# Patient Record
Sex: Male | Born: 1940
Health system: Southern US, Community
[De-identification: ages and names within clinical notes are randomized; demographics above are authoritative.]

## PROBLEM LIST (undated history)

## (undated) DIAGNOSIS — K219 Gastro-esophageal reflux disease without esophagitis: Secondary | ICD-10-CM

## (undated) DIAGNOSIS — I714 Abdominal aortic aneurysm, without rupture, unspecified: Secondary | ICD-10-CM

## (undated) DIAGNOSIS — I10 Essential (primary) hypertension: Secondary | ICD-10-CM

## (undated) DIAGNOSIS — I509 Heart failure, unspecified: Secondary | ICD-10-CM

## (undated) DIAGNOSIS — R519 Headache, unspecified: Secondary | ICD-10-CM

## (undated) DIAGNOSIS — I4891 Unspecified atrial fibrillation: Secondary | ICD-10-CM

## (undated) DIAGNOSIS — I219 Acute myocardial infarction, unspecified: Secondary | ICD-10-CM

## (undated) DIAGNOSIS — C801 Malignant (primary) neoplasm, unspecified: Secondary | ICD-10-CM

## (undated) DIAGNOSIS — I499 Cardiac arrhythmia, unspecified: Secondary | ICD-10-CM

## (undated) DIAGNOSIS — G610 Guillain-Barre syndrome: Secondary | ICD-10-CM

## (undated) DIAGNOSIS — I639 Cerebral infarction, unspecified: Secondary | ICD-10-CM

## (undated) DIAGNOSIS — D496 Neoplasm of unspecified behavior of brain: Secondary | ICD-10-CM

## (undated) DIAGNOSIS — N200 Calculus of kidney: Secondary | ICD-10-CM

## (undated) HISTORY — DX: Cardiac arrhythmia, unspecified: I49.9

## (undated) HISTORY — DX: Neoplasm of unspecified behavior of brain: D49.6

## (undated) HISTORY — PX: LITHOTRIPSY: SUR834

## (undated) HISTORY — DX: Guillain-Barre syndrome: G61.0

## (undated) HISTORY — DX: Calculus of kidney: N20.0

## (undated) HISTORY — DX: Heart failure, unspecified: I50.9

---

## 1986-02-13 DIAGNOSIS — G610 Guillain-Barre syndrome: Secondary | ICD-10-CM

## 1986-02-13 HISTORY — DX: Guillain-Barre syndrome: G61.0

## 1995-04-03 DIAGNOSIS — I219 Acute myocardial infarction, unspecified: Secondary | ICD-10-CM

## 1995-04-03 HISTORY — PX: CORONARY ANGIOPLASTY: SHX604

## 1995-04-03 HISTORY — DX: Acute myocardial infarction, unspecified: I21.9

## 2004-05-02 ENCOUNTER — Ambulatory Visit: Payer: Self-pay | Admitting: Family Medicine

## 2004-06-06 ENCOUNTER — Ambulatory Visit: Payer: Self-pay | Admitting: Family Medicine

## 2004-09-04 ENCOUNTER — Ambulatory Visit: Payer: Self-pay | Admitting: Family Medicine

## 2005-01-11 ENCOUNTER — Ambulatory Visit: Payer: Self-pay | Admitting: Family Medicine

## 2005-04-19 ENCOUNTER — Ambulatory Visit: Payer: Self-pay | Admitting: Family Medicine

## 2005-04-26 ENCOUNTER — Ambulatory Visit: Payer: Self-pay | Admitting: Family Medicine

## 2005-08-20 ENCOUNTER — Ambulatory Visit: Payer: Self-pay | Admitting: Family Medicine

## 2005-12-25 ENCOUNTER — Ambulatory Visit: Payer: Self-pay | Admitting: Family Medicine

## 2006-02-25 ENCOUNTER — Ambulatory Visit: Payer: Self-pay | Admitting: Family Medicine

## 2006-05-30 ENCOUNTER — Ambulatory Visit: Payer: Self-pay | Admitting: Family Medicine

## 2006-07-01 ENCOUNTER — Ambulatory Visit: Payer: Self-pay | Admitting: Family Medicine

## 2010-06-22 ENCOUNTER — Other Ambulatory Visit: Payer: Self-pay | Admitting: Neurology

## 2010-06-22 DIAGNOSIS — G610 Guillain-Barre syndrome: Secondary | ICD-10-CM

## 2010-06-22 DIAGNOSIS — M531 Cervicobrachial syndrome: Secondary | ICD-10-CM

## 2010-06-22 DIAGNOSIS — H8110 Benign paroxysmal vertigo, unspecified ear: Secondary | ICD-10-CM

## 2010-06-22 DIAGNOSIS — I2510 Atherosclerotic heart disease of native coronary artery without angina pectoris: Secondary | ICD-10-CM

## 2010-06-22 DIAGNOSIS — E785 Hyperlipidemia, unspecified: Secondary | ICD-10-CM

## 2010-06-22 DIAGNOSIS — H11069 Recurrent pterygium of unspecified eye: Secondary | ICD-10-CM

## 2010-06-22 DIAGNOSIS — N35919 Unspecified urethral stricture, male, unspecified site: Secondary | ICD-10-CM

## 2010-06-26 ENCOUNTER — Ambulatory Visit
Admission: RE | Admit: 2010-06-26 | Discharge: 2010-06-26 | Disposition: A | Discharge status: Home / Self Care | Payer: Medicare Other | Source: Ambulatory Visit | Attending: Neurology | Admitting: Neurology

## 2010-06-26 DIAGNOSIS — H8110 Benign paroxysmal vertigo, unspecified ear: Secondary | ICD-10-CM

## 2010-06-26 DIAGNOSIS — H11069 Recurrent pterygium of unspecified eye: Secondary | ICD-10-CM

## 2010-06-26 DIAGNOSIS — I2510 Atherosclerotic heart disease of native coronary artery without angina pectoris: Secondary | ICD-10-CM

## 2010-06-26 DIAGNOSIS — G610 Guillain-Barre syndrome: Secondary | ICD-10-CM

## 2010-06-26 DIAGNOSIS — E785 Hyperlipidemia, unspecified: Secondary | ICD-10-CM

## 2010-06-26 DIAGNOSIS — N35919 Unspecified urethral stricture, male, unspecified site: Secondary | ICD-10-CM

## 2010-06-26 DIAGNOSIS — M531 Cervicobrachial syndrome: Secondary | ICD-10-CM

## 2010-06-26 MED ORDER — GADOBENATE DIMEGLUMINE 529 MG/ML IV SOLN
17.0000 mL | Freq: Once | INTRAVENOUS | Status: AC | PRN
  Administered 2010-06-26: 17 mL via INTRAVENOUS

## 2010-06-27 ENCOUNTER — Ambulatory Visit (HOSPITAL_BASED_OUTPATIENT_CLINIC_OR_DEPARTMENT_OTHER)
Admission: RE | Admit: 2010-06-27 | Discharge: 2010-06-27 | Disposition: A | Discharge status: Home / Self Care | Payer: Medicare Other | Source: Ambulatory Visit | Attending: Urology | Admitting: Urology

## 2010-06-27 ENCOUNTER — Ambulatory Visit (HOSPITAL_COMMUNITY)
Admission: RE | Admit: 2010-06-27 | Discharge: 2010-06-27 | Disposition: A | Discharge status: Home / Self Care | Payer: Medicare Other | Source: Ambulatory Visit | Attending: Urology | Admitting: Urology

## 2010-06-27 DIAGNOSIS — I771 Stricture of artery: Secondary | ICD-10-CM | POA: Insufficient documentation

## 2010-06-27 DIAGNOSIS — Z01812 Encounter for preprocedural laboratory examination: Secondary | ICD-10-CM | POA: Insufficient documentation

## 2010-06-27 DIAGNOSIS — Z01811 Encounter for preprocedural respiratory examination: Secondary | ICD-10-CM | POA: Insufficient documentation

## 2010-06-27 DIAGNOSIS — N478 Other disorders of prepuce: Secondary | ICD-10-CM | POA: Insufficient documentation

## 2010-06-27 DIAGNOSIS — N471 Phimosis: Secondary | ICD-10-CM | POA: Insufficient documentation

## 2010-06-27 DIAGNOSIS — I10 Essential (primary) hypertension: Secondary | ICD-10-CM | POA: Insufficient documentation

## 2010-06-27 DIAGNOSIS — M47814 Spondylosis without myelopathy or radiculopathy, thoracic region: Secondary | ICD-10-CM | POA: Insufficient documentation

## 2010-06-27 DIAGNOSIS — N35919 Unspecified urethral stricture, male, unspecified site: Secondary | ICD-10-CM | POA: Insufficient documentation

## 2010-06-27 DIAGNOSIS — N480 Leukoplakia of penis: Secondary | ICD-10-CM | POA: Insufficient documentation

## 2010-06-27 LAB — POCT I-STAT 4, (NA,K, GLUC, HGB,HCT)
Hemoglobin: 16.3 g/dL (ref 13.0–17.0)
Sodium: 144 mEq/L (ref 135–145)

## 2010-06-27 NOTE — Op Note (Signed)
NAME:  Theodore Mendoza, Theodore Mendoza NO.:  [id number]  MEDICAL RECORD NO.:  [id number]           PATIENT TYPE:  O  LOCATION:  XRAY                         FACILITY:  [facility]  PHYSICIAN:  Martina Sinner, MD DATE OF BIRTH:  Apr 22, 1940  DATE OF PROCEDURE:  06/27/2010 DATE OF DISCHARGE:                              OPERATIVE REPORT   SURGEON:  Martina Sinner, MD.  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  OPERATIONS: 1. Cystoscopy. 2. Retrograde urethrogram. 3. Dilation of meatal stenosis and repair of phimosis.  INDICATIONS:  Theodore Mendoza has the above diagnoses.  Extra care was taken with leg positioning.  He was given preoperative cephalosporin IV.  DESCRIPTION OF PROCEDURE:  Under anesthesia I could see approximately 0.5 cm of his glans penis and part of what appeared to be a fairly healthy urethral meatus.  It looked like the distal obstruction was probably due to the overlying concealment from the foreskin adhered to the glans penis from BXO and probably not true meatal stenosis.  In any event, I easily dilated him to 18-French with a male sound well lubricated and easily inserted a 14-French catheter into his bladder.  For approximately the next 45 to 60 minutes, I used blunt dissection with retraction maneuvers that were well controlled and some sharp dissection to take back his penile skin off the glans penis and beyond the corona circumferentially.  More work was done at 7 o'clock just to the right side of the frenulum.  The dissection actually went very well and I never entered the glans penis or tunica albuginea.  Cautery was used for minimal bleeding.  The amount of adherence was quite remarkable but with careful dissection, I was able to  free up the glans as well as urethral meatus.  Urethral meatus at the end of the case was more than 1 cm from the proximal skin.  I cystoscoped the patient and he had a normal penile bulbar membranous urethra.  He had bilobar enlargement of the prostate and high-riding bladder neck.  His bladder was minimally trabeculated.  His prostatic urethra was little bit rigid.  Bladder mucosa and trigone were normal. There was no stitch, foreign body, or carcinoma.  A 14-French catheter was reinserted.  After taking down the adhered skin, I reapproximated the penile skin to shiny subcoronal tissue with approximately 20 interrupted 4-0 chromic almost as if I was doing a closure after circumcision.  This buried raw subcutaneous tissue from my dissection and really made the repair look good and not under tension. After cleaning the penis, I did a dorsal block with 7 cc of 0.25% Marcaine without epinephrine.  I then applied a light Coban dressing with Telfa and Polysporin ointment. The glans looked healthy.  Never during the dissection did I interfere with blood supply to the glans penis or distal corporal body.  I was very pleased with Theodore Mendoza procedure and hopefully this will reach his treatment goal.  ______________________________ Martina Sinner, MD     SAM/MEDQ  D:  06/27/2010  T:  06/27/2010  Job:  [id number]  Electronically Signed by Alfredo Martinez MD on 06/27/2010 12:56:16 PM

## 2010-06-27 NOTE — Op Note (Signed)
  NAME:  Theodore Mendoza, Theodore Mendoza NO.:  [id number]  MEDICAL RECORD NO.:  [id number]           PATIENT TYPE:  O  LOCATION:  XRAY                         [facility]ACILITY:  [facility]  PHYSICIAN:  Martina Sinner, MD DATE OF BIRTH:  [birth year]  DATE OF PROCEDURE: DATE OF DISCHARGE:                              OPERATIVE REPORT   ADDENDUM  PREOPERATIVE DIAGNOSIS:  Meatal stenosis, phimosis secondary to balanitis xerotica obliterans.  POSTOPERATIVE DIAGNOSIS:  Meatal stenosis, phimosis secondary to balanitis xerotica obliterans.  PROCEDURE:  Cystoscopy, retrograde urethrogram, urethral dilation plus repair of phimosis.  SURGEON:  Martina Sinner, MD, October 27.  I did a retrograde urethrogram with a 6-French open-ended ureteral catheter as part of the operation previously dictated.  The patient's retrograde urethrogram; with the patient in AP and anterior-posterior position in lithotomy, I placed the penis on stretch to the patient's left side.  Fluoroscopy was utilized.  Well-lubricated 6-French open-end ureteral catheter was inserted just beyond the meatus and I injected 7 mL of contrast and saw a normal looking urethra with contrast entering the bladder.          ______________________________ Martina Sinner, MD     SAM/MEDQ  D:  06/27/2010  T:  06/27/2010  Job:  [id number]  Electronically Signed by Alfredo Martinez MD on 06/27/2010 12:56:18 PM

## 2011-01-17 DIAGNOSIS — Z8669 Personal history of other diseases of the nervous system and sense organs: Secondary | ICD-10-CM | POA: Insufficient documentation

## 2011-01-17 DIAGNOSIS — I252 Old myocardial infarction: Secondary | ICD-10-CM | POA: Insufficient documentation

## 2011-12-13 DIAGNOSIS — D329 Benign neoplasm of meninges, unspecified: Secondary | ICD-10-CM | POA: Insufficient documentation

## 2013-07-31 HISTORY — PX: EYE SURGERY: SHX253

## 2014-02-09 HISTORY — PX: OTHER SURGICAL HISTORY: SHX169

## 2014-05-07 ENCOUNTER — Emergency Department (HOSPITAL_COMMUNITY): Payer: Medicare Other

## 2014-05-07 ENCOUNTER — Inpatient Hospital Stay (HOSPITAL_COMMUNITY)
Admission: EM | Admit: 2014-05-07 | Discharge: 2014-05-09 | DRG: 871 | Disposition: A | Discharge status: Home / Self Care | Payer: Medicare Other | Attending: Family Medicine | Admitting: Family Medicine

## 2014-05-07 ENCOUNTER — Encounter (HOSPITAL_COMMUNITY): Payer: Self-pay | Admitting: Family Medicine

## 2014-05-07 DIAGNOSIS — G650 Sequelae of Guillain-Barre syndrome: Secondary | ICD-10-CM | POA: Diagnosis present

## 2014-05-07 DIAGNOSIS — Z7982 Long term (current) use of aspirin: Secondary | ICD-10-CM

## 2014-05-07 DIAGNOSIS — I10 Essential (primary) hypertension: Secondary | ICD-10-CM | POA: Diagnosis present

## 2014-05-07 DIAGNOSIS — M24542 Contracture, left hand: Secondary | ICD-10-CM | POA: Diagnosis present

## 2014-05-07 DIAGNOSIS — N136 Pyonephrosis: Secondary | ICD-10-CM | POA: Diagnosis present

## 2014-05-07 DIAGNOSIS — I2510 Atherosclerotic heart disease of native coronary artery without angina pectoris: Secondary | ICD-10-CM | POA: Diagnosis present

## 2014-05-07 DIAGNOSIS — N179 Acute kidney failure, unspecified: Secondary | ICD-10-CM | POA: Diagnosis present

## 2014-05-07 DIAGNOSIS — N12 Tubulo-interstitial nephritis, not specified as acute or chronic: Secondary | ICD-10-CM | POA: Diagnosis present

## 2014-05-07 DIAGNOSIS — A419 Sepsis, unspecified organism: Secondary | ICD-10-CM | POA: Diagnosis present

## 2014-05-07 DIAGNOSIS — N202 Calculus of kidney with calculus of ureter: Secondary | ICD-10-CM | POA: Diagnosis present

## 2014-05-07 DIAGNOSIS — R531 Weakness: Secondary | ICD-10-CM | POA: Diagnosis present

## 2014-05-07 DIAGNOSIS — IMO0001 Reserved for inherently not codable concepts without codable children: Secondary | ICD-10-CM | POA: Insufficient documentation

## 2014-05-07 DIAGNOSIS — Z86011 Personal history of benign neoplasm of the brain: Secondary | ICD-10-CM

## 2014-05-07 DIAGNOSIS — T464X5A Adverse effect of angiotensin-converting-enzyme inhibitors, initial encounter: Secondary | ICD-10-CM | POA: Diagnosis present

## 2014-05-07 DIAGNOSIS — M24541 Contracture, right hand: Secondary | ICD-10-CM | POA: Diagnosis present

## 2014-05-07 DIAGNOSIS — G92 Toxic encephalopathy: Secondary | ICD-10-CM | POA: Diagnosis present

## 2014-05-07 DIAGNOSIS — Z951 Presence of aortocoronary bypass graft: Secondary | ICD-10-CM | POA: Diagnosis not present

## 2014-05-07 DIAGNOSIS — E785 Hyperlipidemia, unspecified: Secondary | ICD-10-CM | POA: Diagnosis present

## 2014-05-07 DIAGNOSIS — R4182 Altered mental status, unspecified: Secondary | ICD-10-CM

## 2014-05-07 DIAGNOSIS — I252 Old myocardial infarction: Secondary | ICD-10-CM

## 2014-05-07 DIAGNOSIS — N390 Urinary tract infection, site not specified: Secondary | ICD-10-CM

## 2014-05-07 DIAGNOSIS — R109 Unspecified abdominal pain: Secondary | ICD-10-CM

## 2014-05-07 DIAGNOSIS — N132 Hydronephrosis with renal and ureteral calculous obstruction: Secondary | ICD-10-CM | POA: Insufficient documentation

## 2014-05-07 DIAGNOSIS — F329 Major depressive disorder, single episode, unspecified: Secondary | ICD-10-CM | POA: Diagnosis present

## 2014-05-07 HISTORY — DX: Essential (primary) hypertension: I10

## 2014-05-07 LAB — URINALYSIS, ROUTINE W REFLEX MICROSCOPIC
GLUCOSE, UA: NEGATIVE mg/dL
Ketones, ur: 15 mg/dL — AB
Nitrite: POSITIVE — AB
PH: 5 (ref 5.0–8.0)
Protein, ur: >300 mg/dL — AB
Specific Gravity, Urine: 1.02 (ref 1.005–1.030)
Urobilinogen, UA: 1 mg/dL (ref 0.0–1.0)

## 2014-05-07 LAB — COMPREHENSIVE METABOLIC PANEL
ALK PHOS: 75 U/L (ref 39–117)
ALT: 12 U/L (ref 0–53)
AST: 20 U/L (ref 0–37)
Albumin: 3.3 g/dL — ABNORMAL LOW (ref 3.5–5.2)
Anion gap: 5 (ref 5–15)
BUN: 41 mg/dL — ABNORMAL HIGH (ref 6–23)
CHLORIDE: 100 mmol/L (ref 96–112)
CO2: 30 mmol/L (ref 19–32)
Calcium: 9 mg/dL (ref 8.4–10.5)
Creatinine, Ser: 2.52 mg/dL — ABNORMAL HIGH (ref 0.50–1.35)
GFR, EST AFRICAN AMERICAN: 27 mL/min — AB (ref >90)
GFR, EST NON AFRICAN AMERICAN: 23 mL/min — AB (ref >90)
Glucose, Bld: 114 mg/dL — ABNORMAL HIGH (ref 70–99)
POTASSIUM: 3.9 mmol/L (ref 3.5–5.1)
Sodium: 135 mmol/L (ref 135–145)
Total Bilirubin: 0.9 mg/dL (ref 0.3–1.2)
Total Protein: 6.4 g/dL (ref 6.0–8.3)

## 2014-05-07 LAB — CBC WITH DIFFERENTIAL/PLATELET
BASOS ABS: 0 10*3/uL (ref 0.0–0.1)
Basophils Relative: 0 % (ref 0–1)
EOS PCT: 1 % (ref 0–5)
Eosinophils Absolute: 0.1 10*3/uL (ref 0.0–0.7)
HCT: 39.5 % (ref 39.0–52.0)
Hemoglobin: 13.2 g/dL (ref 13.0–17.0)
LYMPHS ABS: 1.4 10*3/uL (ref 0.7–4.0)
LYMPHS PCT: 8 % — AB (ref 12–46)
MCH: 29.2 pg (ref 26.0–34.0)
MCHC: 33.4 g/dL (ref 30.0–36.0)
MCV: 87.4 fL (ref 78.0–100.0)
Monocytes Absolute: 1.4 10*3/uL — ABNORMAL HIGH (ref 0.1–1.0)
Monocytes Relative: 8 % (ref 3–12)
NEUTROS ABS: 14.4 10*3/uL — AB (ref 1.7–7.7)
Neutrophils Relative %: 83 % — ABNORMAL HIGH (ref 43–77)
PLATELETS: 139 10*3/uL — AB (ref 150–400)
RBC: 4.52 MIL/uL (ref 4.22–5.81)
RDW: 13.8 % (ref 11.5–15.5)
WBC: 17.3 10*3/uL — ABNORMAL HIGH (ref 4.0–10.5)

## 2014-05-07 LAB — URINE MICROSCOPIC-ADD ON

## 2014-05-07 LAB — I-STAT CG4 LACTIC ACID, ED
LACTIC ACID, VENOUS: 1.04 mmol/L (ref 0.5–2.0)
Lactic Acid, Venous: 0.53 mmol/L (ref 0.5–2.0)

## 2014-05-07 MED ORDER — SODIUM CHLORIDE 0.9 % IV BOLUS (SEPSIS)
1000.0000 mL | INTRAVENOUS | Status: AC
  Administered 2014-05-07 (×2): 1000 mL via INTRAVENOUS

## 2014-05-07 MED ORDER — DEXTROSE 5 % IV SOLN: 1.0000 g | INTRAVENOUS | Status: DC

## 2014-05-07 MED ORDER — SODIUM CHLORIDE 0.9 % IV BOLUS (SEPSIS)
500.0000 mL | INTRAVENOUS | Status: AC
  Administered 2014-05-07: 500 mL via INTRAVENOUS

## 2014-05-07 MED ORDER — SODIUM CHLORIDE 0.9 % IV SOLN: Freq: Once | INTRAVENOUS | Status: DC

## 2014-05-07 MED ORDER — DEXTROSE 5 % IV SOLN
2.0000 g | Freq: Once | INTRAVENOUS | Status: AC
  Administered 2014-05-07: 2 g via INTRAVENOUS
  Filled 2014-05-07: qty 2

## 2014-05-07 MED ORDER — NALOXONE HCL 0.4 MG/ML IJ SOLN
INTRAMUSCULAR | Status: AC
  Filled 2014-05-07: qty 1

## 2014-05-07 NOTE — ED Notes (Signed)
Attempted to call report

## 2014-05-07 NOTE — H&P (Signed)
[facility] History and Physical  Theodore Mendoza ZGY:[id number] DOB: December 10, 1940 DOA: 05/07/2014  Referring physician: ED physician PCP: Sherrie Mustache, MD  Specialists:   Chief Complaint: Generalized weakness and hallucination  HPI: Theodore Mendoza is a 74 y.o. 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  Patient was was treated in the [facility] on 04/10/14 for UTI. He completed the Keflex treatment. Symptoms improved. At 05/05/14, patient developed left flank pain. He was evaluated in [facility]. He was diagnosed with kidney stone. He was discharged home on tamsulosin, Zofran, ketorolac, percocet and ciprofloxacin.  After 3 days of taking those medications, he developed hallucination, and he stopped taking his medications. Per his wife, patient saw candy flying in the room and strange people on the wall. Patient denies fever or chills. He denies symptoms of UTI. Patient denies fever, chills, cough, chest pain, SOB, abdominal pain, diarrhea, dysuria, urgency, frequency, hematuria, skin rashes.   Work up in the ED demonstrates positive urinalysis for UTI. Lactate 1.04. CT head is negative for acute abnormalities. Leukocytosis with WBC 17.3. CT-renal stone protocol showed moderate left-sided hydroureteronephrosis secondary to a proximal ureteric stone and left nephrolithiasis. Patient is admitted to inpatient for further evaluation and treatment. Urology was consulted by ED.  Review of Systems: As presented in the history of presenting illness, rest negative.  Where does patient live?  At home Can patient participate in ADLs? none  Allergy:  Allergies  Allergen Reactions  . Hydrocodone-Acetaminophen Other (See Comments)    Confusion,  weakness, hallucinations, mental status changes  . Morphine And Related Other (See Comments)    Confusion, weakness, hallucinations, mental status changes  . Valsartan Rash    Past Medical History  Diagnosis Date  . Hypertension     History reviewed. No pertinent past surgical history.  Social History:  reports that he has never smoked. He does not have any smokeless tobacco history on file. He reports that he does not drink alcohol or use illicit drugs.  Family History: History reviewed. No pertinent family history.   Prior to Admission medications   Not on File    Physical Exam: Filed Vitals:   05/08/14 0815 05/08/14 0820 05/08/14 0825 05/08/14 0840  BP: 126/64   123/50  Pulse: 73 75 77 71  Temp: 98.5 F (36.9 C)  98.5 F (36.9 C) 98.1 F (36.7 C)  TempSrc:    Axillary  Resp: 16 17 13 18   Height:      Weight:      SpO2: 97% 99% 100% 100%   General: Not in acute distress HEENT:       Eyes: PERRL, EOMI, no scleral icterus       ENT: No discharge from the ears and nose, no pharynx injection, no tonsillar enlargement.        Neck: No JVD, no bruit, no mass felt. Cardiac: S1/S2, RRR, No murmurs, No gallops or rubs Pulm: Good air movement bilaterally. Clear to auscultation bilaterally. No rales, wheezing, rhonchi or rubs. Abd: Soft, nondistended, nontender, no rebound pain, no organomegaly, BS present. Positive left CVA tenderness Ext: No edema bilaterally. 2+DP/PT pulse bilaterally Musculoskeletal: hand contracture and weakness in both legs, on leg braces. Skin: No rashes.  Neuro: Alert and oriented X3, cranial nerves II-XII grossly intact, moves all extremities.  Psych: Patient is not psychotic, no suicidal or hemocidal ideation.  Labs on Admission:  Basic Metabolic Panel:  Recent Labs Lab 05/07/14 1403 05/08/14 0300  NA 135 138  K 3.9 4.2  CL 100 107  CO2 30 25  GLUCOSE 114* 100*  BUN 41* 44*  CREATININE 2.52* 1.84*  CALCIUM 9.0 8.1*   Liver Function  Tests:  Recent Labs Lab 05/07/14 1403 05/08/14 0300  AST 20 13  ALT 12 10  ALKPHOS 75 63  BILITOT 0.9 0.7  PROT 6.4 5.8*  ALBUMIN 3.3* 3.0*   No results for input(s): LIPASE, AMYLASE in the last 168 hours. No results for input(s): AMMONIA in the last 168 hours. CBC:  Recent Labs Lab 05/07/14 1403 05/08/14 0300  WBC 17.3* 12.6*  NEUTROABS 14.4*  --   HGB 13.2 11.3*  HCT 39.5 34.8*  MCV 87.4 89.5  PLT 139* 121*   Cardiac Enzymes: No results for input(s): CKTOTAL, CKMB, CKMBINDEX, TROPONINI in the last 168 hours.  BNP (last 3 results) No results for input(s): BNP in the last 8760 hours.  ProBNP (last 3 results) No results for input(s): PROBNP in the last 8760 hours.  CBG:  Recent Labs Lab 05/08/14 0635  GLUCAP 88    Radiological Exams on Admission: Ct Head Wo Contrast  05/07/2014   CLINICAL DATA:  Mental status change  EXAM: CT HEAD WITHOUT CONTRAST  TECHNIQUE: Contiguous axial images were obtained from the base of the skull through the vertex without intravenous contrast.  COMPARISON:  06/26/2010 brain MRI interpreted by [facility], report not available. No prior head CT for comparison  FINDINGS: Stable left cerebellopontine angle mass with peripheral rim calcification as previously evaluated. Mild cortical volume loss noted with proportional ventricular prominence. Areas of periventricular white matter hypodensity are most compatible with small vessel ischemic change. No acute hemorrhage, infarct, or mass lesion is identified. No midline shift. Stable bilateral tiny basal ganglial lacunar infarcts. Mild pansinusitis with mucoperiosteal thickening but no osseous destruction. No skull fracture.  IMPRESSION: No new acute intracranial finding.  Stable findings as above.   Electronically Signed   By: Conchita Paris M.D.   On: 05/07/2014 19:58   Dg Chest Port 1 View  05/07/2014   CLINICAL DATA:  Weakness  EXAM: PORTABLE CHEST - 1 VIEW  COMPARISON:   06/27/2010  FINDINGS: Heart size at upper limits of normal. Central vascular congestion noted without overt edema. No new pulmonary opacity. Linear left lower lobe atelectasis or scarring is noted. No pleural effusion.  IMPRESSION: Low volumes with curvilinear left lower lobe atelectasis or scarring.   Electronically Signed   By: Conchita Paris M.D.   On: 05/07/2014 19:52   Ct Renal Stone Study  05/07/2014   CLINICAL DATA:  Initial encounter for kidney stones. Urinary retention. Left flank pain and dark urine.  EXAM: CT ABDOMEN AND PELVIS WITHOUT CONTRAST  TECHNIQUE: Multidetector CT imaging of the abdomen and pelvis was performed following the standard protocol without IV contrast.  COMPARISON:  05/05/2014  FINDINGS: Lower chest: Mild left base atelectasis. Mild cardiomegaly with coronary artery atherosclerosis, including within the right coronary artery and LAD. No pericardial or pleural effusion.  Hepatobiliary: Mild hepatic steatosis. Normal gallbladder, without biliary ductal dilatation.  Pancreas: Moderate to marked pancreatic atrophy. No acute inflammation identified.  Spleen: Normal  Adrenals/Urinary Tract: Normal adrenal glands. Upper pole right renal low-density lesion which is likely a cyst. There is also a lower pole right renal cyst of 2.3 cm.  Multiple left renal collecting system calculi, including a lower pole 11 mm stone. Moderate left-sided hydroureteronephrosis is similar. This continues to the level of a proximal left ureteric 4 mm stone which may have progressed minimally. Example image 55 today. No distal ureteric or bladder stones.  Stomach/Bowel: Normal stomach, without wall thickening. Scattered colonic diverticula. Normal terminal ileum and appendix. Normal small bowel.  Vascular/Lymphatic: Aortic and branch vessel atherosclerosis. Non aneurysmal dilatation of the infrarenal aorta at 2.5 cm unchanged. Probable calcified wall thrombus. Mild celiac origin ectasia could relate to  poststenotic dilatation. No abdominopelvic adenopathy.  Reproductive: Mild prostatomegaly.  Other: Trace cul-de-sac fluid is new on image 83.  Musculoskeletal: No acute osseous abnormality.  IMPRESSION: 1. Moderate left-sided hydroureteronephrosis secondary to a proximal ureteric stone. This may have passed minimally distally since the CT of 05/05/2014 (from [facility]). 2. Left nephrolithiasis. 3.  Atherosclerosis, including within the coronary arteries. 4. Trace cul-de-sac fluid, new since the prior. 5. Prostatomegaly.   Electronically Signed   By: Abigail Miyamoto M.D.   On: 05/07/2014 21:39    Assessment/Plan Principal Problem:   Pyelonephritis Active Problems:   Sepsis   Essential (primary) hypertension   AKI (acute kidney injury)   Hyperlipidemia  Pyelonephritis and sepsis: Secondary to left Nephrolithiasis. He has Hydronephrosis as evidenced by CT scan. Urology was consulted. Dr. Tresa Moore saw the patient. Per dr. Tresa Moore, patient will need cysto / left ureteral stent in semi-urgent setting today for renal decompression,  followed by likely two stage ureteroscopic stone manipulation in outpatient setting in few weeks, after clears infectious parameters and GFR improved.   -will transfer pt to [facility] and put on tele bed - appreciate Urology's consult -treat infection with IV Rocephin -Follow-up blood culture and a urine culture -hold amlodipine, lisinopril and tamsulosin because of the sepsis  -IVF: NS 125cc/h -INR/PTTR -trend lactic acid level  HTN:  -continue metoprolol for blood pressure, hold other bp medications.  Acute Renal Failure: Cr 2.52 by ER labs 05/08/14, baseline Cr <1 in 2012. Most likely due to left hydro and infection -treat infection as above -follow up procedure per Urology  Hyperlipidemia:  -continue pravastatin  Hallucination: Most likely due to urinary tract infection and sepsis. His hallucination has resolved. CT head is negative for acute abnormalities. -Watch  closely.   DVT ppx: SQ Heparin        Code Status: Full code Family Communication:   Yes, patient's  wife     at bed side Disposition Plan: Admit to inpatient   Date of Service 05/08/2014    Ivor Costa [facility] Pager [phone]  If 7PM-7AM, please contact night-coverage [email or web address] Password [email or web address] 05/08/2014, 9:26 AM

## 2014-05-07 NOTE — ED Notes (Signed)
Attempted to call report at [phone].

## 2014-05-07 NOTE — ED Notes (Signed)
[facility] contacted to transport patient to [facility]

## 2014-05-07 NOTE — ED Notes (Signed)
Secretary Pharmacist, hospital for transport.

## 2014-05-07 NOTE — Consult Note (Signed)
Reason for Consult: Left Ureteral Joaquim Lai, Urosepsis  Referring Physician: Frederich Cha MD  Theodore Mendoza is an 74 y.o. male.   HPI:  1 - Left Nephrolithiasis with Hydronephrosis - left 60m ureteral stones with moderate hydro and approx 145mconglomerate left lower pole renal stones by ER CT 05/08/2014 on eval persistent left flank pain.   Pre 2016 - medical passage x several. SWL x1, URS x1.   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 NO baseline obsructive voiding symptoms.   3 - Acute Renal Failure - Cr 2.52 by ER labs 05/08/14, baselie Cr <1 in 2012. Left hydro as per above and very poor PO intake past few days.  4 Bacteruria - bacteruria and leukocytosis (17k) by ER labs 05/08/2014. No hypotension / tachycardia. BCX/UCX 05/08/14 pending.  PMH sig for GuLance Coonuses wheelchiar, but otherwise very well adapted), brain tumor [facility] Theodore Mendoza.  Today "Theodore Mendoza seen in consultation for above. He has been seen at 3 ER's in past week for flank pain and malaise and now with DX left ureteral stone and bacteruria.   Past Medical History  Diagnosis Date  . Hypertension     History reviewed. No pertinent past surgical history.  History reviewed. No pertinent family history.  Social History:  reports that he has never smoked. He does not have any smokeless tobacco history on file. He reports that he does not drink alcohol or use illicit drugs.  Allergies:  Allergies  Allergen Reactions  . Valsartan Rash    Medications: I have reviewed the patient's current medications.  Results for orders placed or performed during the hospital encounter of 05/07/14 (from the past 48 hour(s))  CBC with Differential     Status: Abnormal   Collection Time: 05/07/14  2:03 PM  Result Value Ref Range   WBC 17.3 (H) 4.0 - 10.5 K/uL   RBC 4.52 4.22 - 5.81 MIL/uL   Hemoglobin 13.2 13.0 - 17.0 g/dL   HCT  39.5 39.0 - 52.0 %   MCV 87.4 78.0 - 100.0 fL   MCH 29.2 26.0 - 34.0 pg   MCHC 33.4 30.0 - 36.0 g/dL   RDW 13.8 11.5 - 15.5 %   Platelets 139 (L) 150 - 400 K/uL   Neutrophils Relative % 83 (H) 43 - 77 %   Neutro Abs 14.4 (H) 1.7 - 7.7 K/uL   Lymphocytes Relative 8 (L) 12 - 46 %   Lymphs Abs 1.4 0.7 - 4.0 K/uL   Monocytes Relative 8 3 - 12 %   Monocytes Absolute 1.4 (H) 0.1 - 1.0 K/uL   Eosinophils Relative 1 0 - 5 %   Eosinophils Absolute 0.1 0.0 - 0.7 K/uL   Basophils Relative 0 0 - 1 %   Basophils Absolute 0.0 0.0 - 0.1 K/uL  Comprehensive metabolic panel     Status: Abnormal   Collection Time: 05/07/14  2:03 PM  Result Value Ref Range   Sodium 135 135 - 145 mmol/L   Potassium 3.9 3.5 - 5.1 mmol/L   Chloride 100 96 - 112 mmol/L   CO2 30 19 - 32 mmol/L   Glucose, Bld 114 (H) 70 - 99 mg/dL   BUN 41 (H) 6 - 23 mg/dL   Creatinine, Ser 2.52 (H) 0.50 - 1.35 mg/dL   Calcium 9.0 8.4 - 10.5 mg/dL   Total Protein 6.4 6.0 - 8.3  g/dL   Albumin 3.3 (L) 3.5 - 5.2 g/dL   AST 20 0 - 37 U/L   ALT 12 0 - 53 U/L   Alkaline Phosphatase 75 39 - 117 U/L   Total Bilirubin 0.9 0.3 - 1.2 mg/dL   GFR calc non Af Amer 23 (L) >90 mL/min   GFR calc Af Amer 27 (L) >90 mL/min    Comment: (NOTE) The eGFR has been calculated using the CKD EPI equation. This calculation has not been validated in all clinical situations. eGFR's persistently <90 mL/min signify possible Chronic Kidney Disease.    Anion gap 5 5 - 15  Urinalysis, Routine w reflex microscopic     Status: Abnormal   Collection Time: 05/07/14  6:38 PM  Result Value Ref Range   Color, Urine RED (A) YELLOW    Comment: BIOCHEMICALS MAY BE AFFECTED BY COLOR   APPearance TURBID (A) CLEAR   Specific Gravity, Urine 1.020 1.005 - 1.030   pH 5.0 5.0 - 8.0   Glucose, UA NEGATIVE NEGATIVE mg/dL   Hgb urine dipstick LARGE (A) NEGATIVE   Bilirubin Urine MODERATE (A) NEGATIVE   Ketones, ur 15 (A) NEGATIVE mg/dL   Protein, ur >300 (A) NEGATIVE mg/dL    Urobilinogen, UA 1.0 0.0 - 1.0 mg/dL   Nitrite POSITIVE (A) NEGATIVE   Leukocytes, UA LARGE (A) NEGATIVE  Urine microscopic-add on     Status: Abnormal   Collection Time: 05/07/14  6:38 PM  Result Value Ref Range   Squamous Epithelial / LPF RARE RARE   WBC, UA TOO NUMEROUS TO COUNT <3 WBC/hpf   RBC / HPF TOO NUMEROUS TO COUNT <3 RBC/hpf   Bacteria, UA MANY (A) RARE   Urine-Other AMORPHOUS URATES/PHOSPHATES   I-Stat CG4 Lactic Acid, ED (not at [facility])     Status: None   Collection Time: 05/07/14  7:36 PM  Result Value Ref Range   Lactic Acid, Venous 1.04 0.5 - 2.0 mmol/L    Ct Head Wo Contrast  05/07/2014   CLINICAL DATA:  Mental status change  EXAM: CT HEAD WITHOUT CONTRAST  TECHNIQUE: Contiguous axial images were obtained from the base of the skull through the vertex without intravenous contrast.  COMPARISON:  06/26/2010 brain MRI interpreted by [facility], report not available. No prior head CT for comparison  FINDINGS: Stable left cerebellopontine angle mass with peripheral rim calcification as previously evaluated. Mild cortical volume loss noted with proportional ventricular prominence. Areas of periventricular white matter hypodensity are most compatible with small vessel ischemic change. No acute hemorrhage, infarct, or mass lesion is identified. No midline shift. Stable bilateral tiny basal ganglial lacunar infarcts. Mild pansinusitis with mucoperiosteal thickening but no osseous destruction. No skull fracture.  IMPRESSION: No new acute intracranial finding.  Stable findings as above.   Electronically Signed   By: Conchita Paris M.D.   On: 05/07/2014 19:58   Dg Chest Port 1 View  05/07/2014   CLINICAL DATA:  Weakness  EXAM: PORTABLE CHEST - 1 VIEW  COMPARISON:  06/27/2010  FINDINGS: Heart size at upper limits of normal. Central vascular congestion noted without overt edema. No new pulmonary opacity. Linear left lower lobe atelectasis or scarring is noted. No pleural  effusion.  IMPRESSION: Low volumes with curvilinear left lower lobe atelectasis or scarring.   Electronically Signed   By: Conchita Paris M.D.   On: 05/07/2014 19:52   Ct Renal Stone Study  05/07/2014   CLINICAL DATA:  Initial encounter for kidney stones. Urinary  retention. Left flank pain and dark urine.  EXAM: CT ABDOMEN AND PELVIS WITHOUT CONTRAST  TECHNIQUE: Multidetector CT imaging of the abdomen and pelvis was performed following the standard protocol without IV contrast.  COMPARISON:  05/05/2014  FINDINGS: Lower chest: Mild left base atelectasis. Mild cardiomegaly with coronary artery atherosclerosis, including within the right coronary artery and LAD. No pericardial or pleural effusion.  Hepatobiliary: Mild hepatic steatosis. Normal gallbladder, without biliary ductal dilatation.  Pancreas: Moderate to marked pancreatic atrophy. No acute inflammation identified.  Spleen: Normal  Adrenals/Urinary Tract: Normal adrenal glands. Upper pole right renal low-density lesion which is likely a cyst. There is also a lower pole right renal cyst of 2.3 cm.  Multiple left renal collecting system calculi, including a lower pole 11 mm stone. Moderate left-sided hydroureteronephrosis is similar. This continues to the level of a proximal left ureteric 4 mm stone which may have progressed minimally. Example image 55 today. No distal ureteric or bladder stones.  Stomach/Bowel: Normal stomach, without wall thickening. Scattered colonic diverticula. Normal terminal ileum and appendix. Normal small bowel.  Vascular/Lymphatic: Aortic and branch vessel atherosclerosis. Non aneurysmal dilatation of the infrarenal aorta at 2.5 cm unchanged. Probable calcified wall thrombus. Mild celiac origin ectasia could relate to poststenotic dilatation. No abdominopelvic adenopathy.  Reproductive: Mild prostatomegaly.  Other: Trace cul-de-sac fluid is new on image 83.  Musculoskeletal: No acute osseous abnormality.  IMPRESSION: 1. Moderate  left-sided hydroureteronephrosis secondary to a proximal ureteric stone. This may have passed minimally distally since the CT of 05/05/2014 (from [facility] 2. Left nephrolithiasis. 3.  Atherosclerosis, including within the coronary arteries. 4. Trace cul-de-sac fluid, new since the prior. 5. Prostatomegaly.   Electronically Signed   By: Abigail Miyamoto M.D.   On: 05/07/2014 21:39    Review of Systems  Constitutional: Positive for fever, chills and malaise/fatigue.  HENT: Negative.   Eyes: Negative.   Respiratory: Negative.   Cardiovascular: Negative.   Gastrointestinal: Positive for nausea. Negative for vomiting.  Genitourinary: Positive for dysuria and flank pain.  Skin: Negative.   Neurological: Positive for dizziness.  Endo/Heme/Allergies: Negative.   Psychiatric/Behavioral: Negative.    Blood pressure 136/64, pulse 76, temperature 98.3 F (36.8 C), temperature source Oral, resp. rate 16, weight 79.379 kg (175 lb), SpO2 94 %. Physical Exam  Constitutional: He appears well-developed.  HENT:  Head: Normocephalic.  Eyes: Pupils are equal, round, and reactive to light.  Neck: Normal range of motion.  Cardiovascular: Normal rate.   Respiratory: Effort normal.  GI: Soft.  Genitourinary:  Mild left CVAT. Condom cath in place  Musculoskeletal: Normal range of motion.  UE contractures (mild) c/w known neurologic disease  Neurological: He is alert.  Skin: Skin is warm.  Psychiatric: He has a normal mood and affect. Theodore behavior is normal. Judgment and thought content normal.    Assessment/Plan:  1 - Left Nephrolithiasis with Hydronephrosis - will need multistage management with cysto / left ureteral stent in semi-urgent setting today for renal decompression in setting of bacteruria followed by likely two stage ureteroscopic stone manipulation in outpatient setting in few weeks after clears infectious parameters and GFR improved.   Risks including bleeding, infection, damage to  kidney / ureter / bladder, non-cure, discussed as well as rare risks such as DVT, PE, MI, CVA, mortality and anasthetic risks.   2 - Balanaitis Xerotica Obliterans - no evidcne of recurrence on exam, may need dilation as part of endoscopic managmetn of stones.   3 - Acute Renal Failure -  likeey multifactorial with pre-renal (poor intake) and obstructive components. Likely will improve with hydration / decompression.   4 Bacteruria - agree with empiric ABX while awaiting final CX. Renal decompression as per above.    Theodore Mendoza 05/07/2014, 10:24 PM

## 2014-05-07 NOTE — ED Notes (Signed)
Dr. Donna Bernard at the bedside, and updated the patient and family on the plan of care.

## 2014-05-07 NOTE — ED Provider Notes (Signed)
CSN: [id number]     Arrival date & time 05/07/14  1310 History   First MD Initiated Contact with Patient 05/07/14 1811     Chief Complaint  Patient presents with  . Weakness     (Consider location/radiation/quality/duration/timing/severity/associated sxs/prior Treatment) HPI Comments: Patient comes to the ER for evaluation of generalized weakness, mental status changes with confusion and hallucinations. Patient has not been feeling well for several weeks. He was seen at [facility] and diagnosed with urinary tract infection. He was started on Keflex for the infection. Several days ago, patient went to [facility] for evaluation of left flank pain. He was diagnosed with a kidney stone. Patient was given morphine in the ER and has not been feeling well since. He was also prescribed Vicodin which he has taken 1 tablet. Family reports that he has been very confused. He has been having visual hallucinations including seeing a little girl that was not there. Patient has had severe weakness, unable to hold objects because of the generalized weakness. Family reports that in the last 48 hours he has not urinated or had a bowel movement. Patient denies pain currently.  Patient is a 74 y.o. male presenting with weakness.  Weakness    Past Medical History  Diagnosis Date  . Hypertension    History reviewed. No pertinent past surgical history. History reviewed. No pertinent family history. History  Substance Use Topics  . Smoking status: Never Smoker   . Smokeless tobacco: Not on file  . Alcohol Use: No    Review of Systems  Genitourinary: Positive for decreased urine volume.  Neurological: Positive for weakness.  Psychiatric/Behavioral: Positive for hallucinations and confusion.  All other systems reviewed and are negative.     Allergies  Review of patient's allergies indicates no known allergies.  Home Medications   Prior to Admission medications   Not on File    BP 131/60 mmHg  Pulse 74  Temp(Src) 98.3 F (36.8 C) (Oral)  Resp 20  Wt 175 lb (79.379 kg)  SpO2 94% Physical Exam  Constitutional: He is oriented to person, place, and time. He appears well-developed and well-nourished. No distress.  HENT:  Head: Normocephalic and atraumatic.  Right Ear: Hearing normal.  Left Ear: Hearing normal.  Nose: Nose normal.  Mouth/Throat: Oropharynx is clear and moist and mucous membranes are normal.  Eyes: Conjunctivae and EOM are normal. Pupils are equal, round, and reactive to light.  Neck: Normal range of motion. Neck supple.  Cardiovascular: Regular rhythm, S1 normal and S2 normal.  Exam reveals no gallop and no friction rub.   No murmur heard. Pulmonary/Chest: Effort normal and breath sounds normal. No respiratory distress. He exhibits no tenderness.  Abdominal: Soft. Normal appearance and bowel sounds are normal. There is no hepatosplenomegaly. There is no tenderness. There is no rebound, no guarding, no tenderness at McBurney's point and negative Murphy's sign. No hernia.  Musculoskeletal: Normal range of motion.  Neurological: He is alert and oriented to person, place, and time. He has normal strength. No cranial nerve deficit or sensory deficit. Coordination normal. GCS eye subscore is 4. GCS verbal subscore is 5. GCS motor subscore is 6.  Symmetrically decreased muscle tone bilateral lower extremities (chronic)  Skin: Skin is warm, dry and intact. No rash noted. No cyanosis.  Psychiatric: He has a normal mood and affect. His speech is normal and behavior is normal. Thought content normal.  Nursing note and vitals reviewed.   ED Course  Procedures (including critical  care time) Labs Review Labs Reviewed  CBC WITH DIFFERENTIAL/PLATELET - Abnormal; Notable for the following:    WBC 17.3 (*)    Platelets 139 (*)    Neutrophils Relative % 83 (*)    Neutro Abs 14.4 (*)    Lymphocytes Relative 8 (*)    Monocytes Absolute 1.4 (*)    All other  components within normal limits  COMPREHENSIVE METABOLIC PANEL - Abnormal; Notable for the following:    Glucose, Bld 114 (*)    BUN 41 (*)    Creatinine, Ser 2.52 (*)    Albumin 3.3 (*)    GFR calc non Af Amer 23 (*)    GFR calc Af Amer 27 (*)    All other components within normal limits  URINALYSIS, ROUTINE W REFLEX MICROSCOPIC - Abnormal; Notable for the following:    Color, Urine RED (*)    APPearance TURBID (*)    Hgb urine dipstick LARGE (*)    Bilirubin Urine MODERATE (*)    Ketones, ur 15 (*)    Protein, ur >300 (*)    Nitrite POSITIVE (*)    Leukocytes, UA LARGE (*)    All other components within normal limits  URINE MICROSCOPIC-ADD ON - Abnormal; Notable for the following:    Bacteria, UA MANY (*)    All other components within normal limits  URINE CULTURE  CULTURE, BLOOD (ROUTINE X 2)  CULTURE, BLOOD (ROUTINE X 2)  I-STAT CG4 LACTIC ACID, ED  I-STAT CG4 LACTIC ACID, ED    Imaging Review Ct Head Wo Contrast  05/07/2014   CLINICAL DATA:  Mental status change  EXAM: CT HEAD WITHOUT CONTRAST  TECHNIQUE: Contiguous axial images were obtained from the base of the skull through the vertex without intravenous contrast.  COMPARISON:  06/26/2010 brain MRI interpreted by [facility], report not available. No prior head CT for comparison  FINDINGS: Stable left cerebellopontine angle mass with peripheral rim calcification as previously evaluated. Mild cortical volume loss noted with proportional ventricular prominence. Areas of periventricular white matter hypodensity are most compatible with small vessel ischemic change. No acute hemorrhage, infarct, or mass lesion is identified. No midline shift. Stable bilateral tiny basal ganglial lacunar infarcts. Mild pansinusitis with mucoperiosteal thickening but no osseous destruction. No skull fracture.  IMPRESSION: No new acute intracranial finding.  Stable findings as above.   Electronically Signed   By: Conchita Paris  M.D.   On: 05/07/2014 19:58   Dg Chest Port 1 View  05/07/2014   CLINICAL DATA:  Weakness  EXAM: PORTABLE CHEST - 1 VIEW  COMPARISON:  06/27/2010  FINDINGS: Heart size at upper limits of normal. Central vascular congestion noted without overt edema. No new pulmonary opacity. Linear left lower lobe atelectasis or scarring is noted. No pleural effusion.  IMPRESSION: Low volumes with curvilinear left lower lobe atelectasis or scarring.   Electronically Signed   By: Conchita Paris M.D.   On: 05/07/2014 19:52   Ct Renal Stone Study  05/07/2014   CLINICAL DATA:  Initial encounter for kidney stones. Urinary retention. Left flank pain and dark urine.  EXAM: CT ABDOMEN AND PELVIS WITHOUT CONTRAST  TECHNIQUE: Multidetector CT imaging of the abdomen and pelvis was performed following the standard protocol without IV contrast.  COMPARISON:  05/05/2014  FINDINGS: Lower chest: Mild left base atelectasis. Mild cardiomegaly with coronary artery atherosclerosis, including within the right coronary artery and LAD. No pericardial or pleural effusion.  Hepatobiliary: Mild hepatic steatosis. Normal gallbladder, without biliary  ductal dilatation.  Pancreas: Moderate to marked pancreatic atrophy. No acute inflammation identified.  Spleen: Normal  Adrenals/Urinary Tract: Normal adrenal glands. Upper pole right renal low-density lesion which is likely a cyst. There is also a lower pole right renal cyst of 2.3 cm.  Multiple left renal collecting system calculi, including a lower pole 11 mm stone. Moderate left-sided hydroureteronephrosis is similar. This continues to the level of a proximal left ureteric 4 mm stone which may have progressed minimally. Example image 55 today. No distal ureteric or bladder stones.  Stomach/Bowel: Normal stomach, without wall thickening. Scattered colonic diverticula. Normal terminal ileum and appendix. Normal small bowel.  Vascular/Lymphatic: Aortic and branch vessel atherosclerosis. Non aneurysmal  dilatation of the infrarenal aorta at 2.5 cm unchanged. Probable calcified wall thrombus. Mild celiac origin ectasia could relate to poststenotic dilatation. No abdominopelvic adenopathy.  Reproductive: Mild prostatomegaly.  Other: Trace cul-de-sac fluid is new on image 83.  Musculoskeletal: No acute osseous abnormality.  IMPRESSION: 1. Moderate left-sided hydroureteronephrosis secondary to a proximal ureteric stone. This may have passed minimally distally since the CT of 05/05/2014 (from [facility]). 2. Left nephrolithiasis. 3.  Atherosclerosis, including within the coronary arteries. 4. Trace cul-de-sac fluid, new since the prior. 5. Prostatomegaly.   Electronically Signed   By: Abigail Miyamoto M.D.   On: 05/07/2014 21:39     EKG Interpretation None      MDM   Final diagnoses:  Mental status change  Left flank pain  Sepsis, due to unspecified organism  UTI (lower urinary tract infection)  Hydronephrosis with urinary obstruction due to renal calculus    Patient presents to the ER for evaluation of weakness and delirium. Patient has been extremely weak today, family reports that he has been confused. He has been seeing people that aren't actually there. Workup today is concerning for early sepsis secondary to UTI. Patient has been treated for recent UTI with Keflex, but it appears that he did not complete the course. He was then diagnosed with a kidney stone several days ago. CT scan today shows moderate left-sided hydroureteronephrosis with a proximal left ureteral stone that is 4 mm in diameter.  Case was discussed with Dr. Tresa Moore, on call for urology. As the patient's vital signs are very stable at this point, he does not feel that the patient needs emergent intervention. Patient is to be admitted by the hospitalist and will have ureteral stenting performed in the morning.    Orpah Greek, MD 05/07/14 2219

## 2014-05-07 NOTE — ED Notes (Signed)
Pt having increased weakness since this 9th. sts also recently dx with kidney stones. sts since he has been having urinary retention, and dark urine. sts taking pain meds at home and making him hallucinate.

## 2014-05-07 NOTE — ED Notes (Signed)
Left call back number at [phone] for receiving nurse.

## 2014-05-07 NOTE — ED Notes (Signed)
Portable chest x ray completed.

## 2014-05-08 ENCOUNTER — Inpatient Hospital Stay (HOSPITAL_COMMUNITY): Payer: Medicare Other | Admitting: Certified Registered"

## 2014-05-08 ENCOUNTER — Encounter (HOSPITAL_COMMUNITY)
Admission: EM | Disposition: A | Discharge status: Home / Self Care | Payer: Self-pay | Source: Home / Self Care | Attending: Family Medicine

## 2014-05-08 ENCOUNTER — Encounter (HOSPITAL_COMMUNITY): Payer: Self-pay | Admitting: Certified Registered"

## 2014-05-08 DIAGNOSIS — N12 Tubulo-interstitial nephritis, not specified as acute or chronic: Secondary | ICD-10-CM

## 2014-05-08 DIAGNOSIS — E785 Hyperlipidemia, unspecified: Secondary | ICD-10-CM

## 2014-05-08 HISTORY — PX: CYSTOSCOPY WITH STENT PLACEMENT: SHX5790

## 2014-05-08 LAB — COMPREHENSIVE METABOLIC PANEL
ALT: 10 U/L (ref 0–53)
AST: 13 U/L (ref 0–37)
Albumin: 3 g/dL — ABNORMAL LOW (ref 3.5–5.2)
Alkaline Phosphatase: 63 U/L (ref 39–117)
Anion gap: 6 (ref 5–15)
BUN: 44 mg/dL — ABNORMAL HIGH (ref 6–23)
CO2: 25 mmol/L (ref 19–32)
CREATININE: 1.84 mg/dL — AB (ref 0.50–1.35)
Calcium: 8.1 mg/dL — ABNORMAL LOW (ref 8.4–10.5)
Chloride: 107 mmol/L (ref 96–112)
GFR calc non Af Amer: 34 mL/min — ABNORMAL LOW (ref >90)
GFR, EST AFRICAN AMERICAN: 40 mL/min — AB (ref >90)
Glucose, Bld: 100 mg/dL — ABNORMAL HIGH (ref 70–99)
POTASSIUM: 4.2 mmol/L (ref 3.5–5.1)
Sodium: 138 mmol/L (ref 135–145)
TOTAL PROTEIN: 5.8 g/dL — AB (ref 6.0–8.3)
Total Bilirubin: 0.7 mg/dL (ref 0.3–1.2)

## 2014-05-08 LAB — PROTIME-INR
INR: 1.33 (ref 0.00–1.49)
Prothrombin Time: 16.6 seconds — ABNORMAL HIGH (ref 11.6–15.2)

## 2014-05-08 LAB — CBC
HCT: 34.8 % — ABNORMAL LOW (ref 39.0–52.0)
HEMOGLOBIN: 11.3 g/dL — AB (ref 13.0–17.0)
MCH: 29 pg (ref 26.0–34.0)
MCHC: 32.5 g/dL (ref 30.0–36.0)
MCV: 89.5 fL (ref 78.0–100.0)
Platelets: 121 10*3/uL — ABNORMAL LOW (ref 150–400)
RBC: 3.89 MIL/uL — ABNORMAL LOW (ref 4.22–5.81)
RDW: 13.8 % (ref 11.5–15.5)
WBC: 12.6 10*3/uL — AB (ref 4.0–10.5)

## 2014-05-08 LAB — SURGICAL PCR SCREEN
MRSA, PCR: NEGATIVE
Staphylococcus aureus: NEGATIVE

## 2014-05-08 LAB — APTT: aPTT: 37 seconds (ref 24–37)

## 2014-05-08 LAB — LACTIC ACID, PLASMA
LACTIC ACID, VENOUS: 0.6 mmol/L (ref 0.5–2.0)
Lactic Acid, Venous: 0.8 mmol/L (ref 0.5–2.0)

## 2014-05-08 LAB — PROCALCITONIN: PROCALCITONIN: 0.48 ng/mL

## 2014-05-08 LAB — GLUCOSE, CAPILLARY: GLUCOSE-CAPILLARY: 88 mg/dL (ref 70–99)

## 2014-05-08 SURGERY — CYSTOSCOPY, WITH STENT INSERTION
Anesthesia: General | Site: Ureter | Laterality: Left

## 2014-05-08 MED ORDER — LIDOCAINE HCL (CARDIAC) 20 MG/ML IV SOLN
INTRAVENOUS | Status: DC | PRN
  Administered 2014-05-08: 20 mg via INTRAVENOUS

## 2014-05-08 MED ORDER — CEFTRIAXONE SODIUM IN DEXTROSE 40 MG/ML IV SOLN: 2.0000 g | Freq: Once | INTRAVENOUS | Status: DC

## 2014-05-08 MED ORDER — PROPOFOL 10 MG/ML IV BOLUS
INTRAVENOUS | Status: AC
  Filled 2014-05-08: qty 20

## 2014-05-08 MED ORDER — CEFTRIAXONE SODIUM IN DEXTROSE 40 MG/ML IV SOLN
2.0000 g | INTRAVENOUS | Status: DC
  Filled 2014-05-08: qty 50

## 2014-05-08 MED ORDER — LIDOCAINE HCL (CARDIAC) 20 MG/ML IV SOLN
INTRAVENOUS | Status: AC
  Filled 2014-05-08: qty 5

## 2014-05-08 MED ORDER — ONDANSETRON HCL 4 MG/2ML IJ SOLN: 4.0000 mg | Freq: Once | INTRAMUSCULAR | Status: DC | PRN

## 2014-05-08 MED ORDER — PRAVASTATIN SODIUM 40 MG PO TABS
40.0000 mg | ORAL_TABLET | Freq: Every day | ORAL | Status: DC
  Administered 2014-05-08 – 2014-05-09 (×2): 40 mg via ORAL
  Filled 2014-05-08 (×2): qty 1

## 2014-05-08 MED ORDER — AMLODIPINE BESYLATE 10 MG PO TABS
10.0000 mg | ORAL_TABLET | Freq: Every day | ORAL | Status: DC
  Administered 2014-05-08 – 2014-05-09 (×2): 10 mg via ORAL
  Filled 2014-05-08 (×2): qty 1

## 2014-05-08 MED ORDER — HEPARIN SODIUM (PORCINE) 5000 UNIT/ML IJ SOLN
5000.0000 [IU] | Freq: Three times a day (TID) | INTRAMUSCULAR | Status: DC
  Administered 2014-05-08 – 2014-05-09 (×5): 5000 [IU] via SUBCUTANEOUS
  Filled 2014-05-08 (×3): qty 1

## 2014-05-08 MED ORDER — MIDAZOLAM HCL 2 MG/2ML IJ SOLN
INTRAMUSCULAR | Status: AC
  Filled 2014-05-08: qty 2

## 2014-05-08 MED ORDER — IOHEXOL 300 MG/ML  SOLN
INTRAMUSCULAR | Status: DC | PRN
  Administered 2014-05-08: 10 mL

## 2014-05-08 MED ORDER — SODIUM CHLORIDE 0.9 % IR SOLN
Status: DC | PRN
  Administered 2014-05-08: 3000 mL

## 2014-05-08 MED ORDER — ESCITALOPRAM OXALATE 10 MG PO TABS
10.0000 mg | ORAL_TABLET | Freq: Every day | ORAL | Status: DC
  Administered 2014-05-08 – 2014-05-09 (×2): 10 mg via ORAL
  Filled 2014-05-08 (×2): qty 1

## 2014-05-08 MED ORDER — SODIUM CHLORIDE 0.9 % IJ SOLN
3.0000 mL | Freq: Two times a day (BID) | INTRAMUSCULAR | Status: DC
  Administered 2014-05-08: 3 mL via INTRAVENOUS

## 2014-05-08 MED ORDER — CEFTRIAXONE SODIUM IN DEXTROSE 20 MG/ML IV SOLN
1.0000 g | INTRAVENOUS | Status: DC
  Administered 2014-05-08: 1 g via INTRAVENOUS
  Filled 2014-05-08 (×2): qty 50

## 2014-05-08 MED ORDER — ONDANSETRON HCL 4 MG/2ML IJ SOLN
INTRAMUSCULAR | Status: AC
Start: 2014-05-08 — End: 2014-05-08
  Filled 2014-05-08: qty 2

## 2014-05-08 MED ORDER — ONDANSETRON HCL 4 MG/2ML IJ SOLN
INTRAMUSCULAR | Status: DC | PRN
  Administered 2014-05-08: 4 mg via INTRAVENOUS

## 2014-05-08 MED ORDER — SODIUM CHLORIDE 0.9 % IV SOLN
INTRAVENOUS | Status: DC
  Administered 2014-05-08 – 2014-05-09 (×5): via INTRAVENOUS

## 2014-05-08 MED ORDER — HYDROMORPHONE HCL 1 MG/ML IJ SOLN
0.2500 mg | INTRAMUSCULAR | Status: DC | PRN
  Administered 2014-05-08: 0.25 mg via INTRAVENOUS

## 2014-05-08 MED ORDER — ONDANSETRON HCL 4 MG PO TABS: 4.0000 mg | ORAL_TABLET | Freq: Three times a day (TID) | ORAL | Status: DC | PRN

## 2014-05-08 MED ORDER — GABAPENTIN 300 MG PO CAPS
300.0000 mg | ORAL_CAPSULE | Freq: Three times a day (TID) | ORAL | Status: DC
  Administered 2014-05-08 – 2014-05-09 (×5): 300 mg via ORAL
  Filled 2014-05-08 (×5): qty 1

## 2014-05-08 MED ORDER — HYDROMORPHONE HCL 1 MG/ML IJ SOLN
INTRAMUSCULAR | Status: AC
  Filled 2014-05-08: qty 1

## 2014-05-08 MED ORDER — LACTATED RINGERS IV SOLN
INTRAVENOUS | Status: DC | PRN
  Administered 2014-05-08: 07:00:00 via INTRAVENOUS

## 2014-05-08 MED ORDER — FENTANYL CITRATE 0.05 MG/ML IJ SOLN
INTRAMUSCULAR | Status: AC
  Filled 2014-05-08: qty 2

## 2014-05-08 MED ORDER — FENTANYL CITRATE 0.05 MG/ML IJ SOLN
INTRAMUSCULAR | Status: DC | PRN
  Administered 2014-05-08 (×2): 50 ug via INTRAVENOUS

## 2014-05-08 MED ORDER — PROPOFOL 10 MG/ML IV BOLUS
INTRAVENOUS | Status: DC | PRN
  Administered 2014-05-08: 130 mg via INTRAVENOUS

## 2014-05-08 MED ORDER — ONDANSETRON HCL 4 MG/2ML IJ SOLN
INTRAMUSCULAR | Status: AC
  Filled 2014-05-08: qty 2

## 2014-05-08 MED ORDER — ASPIRIN EC 81 MG PO TBEC
81.0000 mg | DELAYED_RELEASE_TABLET | Freq: Every day | ORAL | Status: DC
  Administered 2014-05-08 – 2014-05-09 (×2): 81 mg via ORAL
  Filled 2014-05-08 (×2): qty 1

## 2014-05-08 MED ORDER — METOPROLOL TARTRATE 25 MG PO TABS
25.0000 mg | ORAL_TABLET | Freq: Every day | ORAL | Status: DC
  Administered 2014-05-08 – 2014-05-09 (×2): 25 mg via ORAL
  Filled 2014-05-08 (×2): qty 1

## 2014-05-08 SURGICAL SUPPLY — 28 items
BAG URINE DRAINAGE (UROLOGICAL SUPPLIES) ×2 IMPLANT
BASKET LASER NITINOL 1.9FR (BASKET) IMPLANT
BASKET STNLS GEMINI 4WIRE 3FR (BASKET) IMPLANT
BASKET ZERO TIP NITINOL 2.4FR (BASKET) IMPLANT
BSKT STON RTRVL 120 1.9FR (BASKET)
BSKT STON RTRVL GEM 120X11 3FR (BASKET)
BSKT STON RTRVL ZERO TP 2.4FR (BASKET)
CATH FOLEY 2WAY SLVR 18FR 30CC (CATHETERS) ×1 IMPLANT
CATH INTERMIT  6FR 70CM (CATHETERS) ×2 IMPLANT
CLOTH BEACON ORANGE TIMEOUT ST (SAFETY) ×2 IMPLANT
ELECT REM PT RETURN 9FT ADLT (ELECTROSURGICAL)
ELECTRODE REM PT RTRN 9FT ADLT (ELECTROSURGICAL) IMPLANT
FIBER LASER FLEXIVA 1000 (UROLOGICAL SUPPLIES) IMPLANT
FIBER LASER FLEXIVA 200 (UROLOGICAL SUPPLIES) IMPLANT
FIBER LASER FLEXIVA 365 (UROLOGICAL SUPPLIES) IMPLANT
FIBER LASER FLEXIVA 550 (UROLOGICAL SUPPLIES) IMPLANT
FIBER LASER TRAC TIP (UROLOGICAL SUPPLIES) IMPLANT
GLOVE BIOGEL M STRL SZ7.5 (GLOVE) ×2 IMPLANT
GOWN STRL REUS W/TWL LRG LVL3 (GOWN DISPOSABLE) ×2 IMPLANT
GUIDEWIRE ANG ZIPWIRE 038X150 (WIRE) ×2 IMPLANT
GUIDEWIRE STR DUAL SENSOR (WIRE) ×2 IMPLANT
IV NS IRRIG 3000ML ARTHROMATIC (IV SOLUTION) ×2 IMPLANT
PACK CYSTO (CUSTOM PROCEDURE TRAY) ×2 IMPLANT
SHIELD EYE BINOCULAR (MISCELLANEOUS) IMPLANT
STENT CONTOUR 6FRX24X.038 (STENTS) ×1 IMPLANT
SYRINGE 10CC LL (SYRINGE) IMPLANT
SYRINGE IRR TOOMEY STRL 70CC (SYRINGE) IMPLANT
TUBE FEEDING 8FR 16IN STR KANG (MISCELLANEOUS) ×2 IMPLANT

## 2014-05-08 NOTE — Op Note (Signed)
Theodore Mendoza, ANGER NO.:  [id number]  MEDICAL RECORD NO.:  [id number]  LOCATION:  [facility]                         FACILITY:  [facility]  PHYSICIAN:  Alexis Frock, MD     DATE OF BIRTH:  Mar 24, 1941  DATE OF PROCEDURE: 05/08/2014  DATE OF DISCHARGE:                              OPERATIVE REPORT   DIAGNOSIS:  Left ureteral stone with urinary tract infection.  PROCEDURE: 1. Cystoscopy with left retrograde pyelogram interpretation. 2. Left ureteral stent placement, 6 x 24, no tether.  ESTIMATED BLOOD LOSS:  Nil.  COMPLICATIONS:  None.  SPECIMENS:  None.  FINDINGS: 1. Grossly purulent appearing urine in the urinary bladder. 2. Mild left hydroureteronephrosis with filling defect in the mid     ureter consistent with known stone. 3. Placement of left ureteral stent, proximal coil upper pole, distal     in the urinary bladder.  INDICATION:  Mr. Lukes is a very pleasant, but unfortunate 74 year old gentleman with history of Guillain-Barre syndrome, recurrent nephrolithiasis, as well as brain cancer.  Despite these comorbidities, he is quite well adapted who presented with approximately 1 week prodrome of malaise, on and off, left flank pain.  He was evaluated at 3 separate emergency rooms at his last visits which was last night. Actual imaging revealed a mid ureteral stone.  There was concern for concomitant infections as there were significant bacteria, pyuria and patient with malaise.  He was not hypotensive, tachycardic.  It was felt that renal decompression was clearly warranted to help clear infectious parameters and prevent further progression of his impending urosepsis. Options for these were discussed including left ureteral stent placement versus nephrostomy tube, and he wished to proceed with a former.  He understands that this will be a staged approach in terms of definitive stone management.  Informed consent was obtained and placed in  medical record.  PROCEDURE IN DETAIL:  The patient being Theodore Mendoza, verified. Procedure being cystoscopy with left stent was confirmed.  Procedure was carried out.  Time-out was performed.  Intravenous antibiotics were administered.  General LMA anesthesia was introduced.  Patient was placed into a low lithotomy position.  Sterile field was created by prepping and draping the patient's penis, perineum, and proximal thighs using iodine x3.  Next, cystourethroscopy was performed using a 22- French rigid cystoscope 30-degree offset lens.  There was patient's procedures for BXO in the past, however his meatus did easily accommodate the 22-French scope without problems or need for formal dilation.  Inspection of the anterior approach of urethra unremarkable. Inspection of bladder revealed copious purulent appearing urine.  This was irrigated times several to allow better visualization of the bladder.  There were no papular lesions, diverticula, calcifications, ureteral orifices appeared singleton in the normal anatomic position. There was some mild diffuse erythema consistent with likely cystitis. The left ureteral orifice was cannulated using 6-French end-hole catheter and left retrograde pyelogram was obtained.  Left pyelogram demonstrated a single left ureter, single system left kidney.  There was a filling defect in the mid ureter consistent with known stone.  There was very mild hydroureteronephrosis above this.  The 0.038 Sensor wire was advanced at the level  of the upper pole and over which a new 6 x 24 Contour-type stent was placed.  Good proximal coil in the upper pole and distal coil in the urinary bladder were noted.  There was efflux of urine seen around and through the distal end of the stent. Given the need for very accurate urine output measuring 18-French Foley catheter placed per urethra to straight drain 10 mL water in the balloon.  Procedure was then terminated.  The  patient tolerated procedure well.  There were no immediate periprocedural complications. The patient was taken to postanesthesia care unit in stable condition.          ______________________________ Alexis Frock, MD     TM/MEDQ  D:  05/08/2014  T:  05/08/2014  Job:  [id number]

## 2014-05-08 NOTE — Progress Notes (Signed)
   05/08/14 1944  RN Transport Assessment (Complete at Beginning of Shift)  RN to accompany transport? No  Transport method Bed  Oxygen needed for transport? No  Does IV pole need to accompany transport? No

## 2014-05-08 NOTE — Anesthesia Preprocedure Evaluation (Addendum)
Anesthesia Evaluation  Patient identified by MRN, date of birth, ID band Patient awake    Reviewed: Allergy & Precautions, NPO status , Patient's Chart, lab work & pertinent test results  Airway        Dental   Pulmonary shortness of breath,          Cardiovascular hypertension, + CAD     Neuro/Psych  Neuromuscular disease    GI/Hepatic GERD-  ,  Endo/Other    Renal/GU Renal disease     Musculoskeletal  (+) Arthritis -,   Abdominal   Peds  Hematology   Anesthesia Other Findings Hx Kidney Stones Hx Tracheostomy Hx Jossie Ng w/ ventilation  Reproductive/Obstetrics                           Anesthesia Physical Anesthesia Plan  ASA: II  Anesthesia Plan: General   Post-op Pain Management:    Induction: Intravenous  Airway Management Planned: LMA and Oral ETT  Additional Equipment:   Intra-op Plan:   Post-operative Plan: Extubation in OR  Informed Consent: I have reviewed the patients History and Physical, chart, labs and discussed the procedure including the risks, benefits and alternatives for the proposed anesthesia with the patient or authorized representative who has indicated his/her understanding and acceptance.     Plan Discussed with: CRNA, Anesthesiologist and Surgeon  Anesthesia Plan Comments:         Anesthesia Quick Evaluation

## 2014-05-08 NOTE — Progress Notes (Signed)
PT Cancellation Note  Patient Details Name: Theodore Mendoza MRN: [id number] DOB: [birth year]   Cancelled Treatment:    Reason Eval/Treat Not Completed: Patient at procedure or test/unavailable Pt currently in PACU.  Also with strict bed rest orders.  Please update activity levels for PT/OT to evaluate pt.  Thanks!   Sarahann Horrell,KATHrine E 05/08/2014, 8:14 AM  Carmelia Bake, PT, DPT 05/08/2014 Pager: [phone]

## 2014-05-08 NOTE — Progress Notes (Signed)
Theodore Mendoza DVV:[id number] DOB: 10/11/1940 DOA: 05/07/2014 PCP: Sherrie Mustache, MD  Brief narrative: 74 y/o ? htn, [place] c Poly-contractures,CAD s/p NSTMIE 2/12/108-s/p PTCA Brain tumour sp Gamma Knife 12/2013 admitted with metabolic encephalopathy + acute kidney injury secondary to medication/pyelonephritis in a setting of left nephrolithiasis and hydronephrosis Urologist was consulted and patient underwent cystoscopy ureteric stent placement 05/08/14  Past medical history-As per Problem list Chart reviewed as below- Reviewed  Consultants:  Urology  Procedures:  Cystoscopy with left retrograde pyelogram status post left ureteric stent 6 x 24-grossly purulent urine, mild left hydroureteronephrosis with filling defect  Antibiotics:  Rocephin   Subjective  Alert oriented pleasant Only mild left flank pain No nausea no vomiting No shortness of breath Has not passed a stool yet   Objective    Interim History: Nursing reports increased urine output~2 50 cc since surgery  Telemetry: Normal sinus   Objective: Filed Vitals:   05/08/14 0825 05/08/14 0840 05/08/14 1015 05/08/14 1343  BP:  123/50 128/54 161/64  Pulse: 77 71 64 73  Temp: 98.5 F (36.9 C) 98.1 F (36.7 C) 98 F (36.7 C) 97.7 F (36.5 C)  TempSrc:  Axillary Axillary Oral  Resp: 13 18 18 18   Height:      Weight:      SpO2: 100% 100% 100% 95%    Intake/Output Summary (Last 24 hours) at 05/08/14 1439 Last data filed at 05/08/14 1343  Gross per 24 hour  Intake 3443.75 ml  Output    740 ml  Net 2703.75 ml    Exam:  General: EOMI NCAT, tracheostomy scar noted poor dentition Cardiovascular: S1-S2 regular rate rhythm Respiratory: Clinically clear no added sound Abdomen: Soft nontender obese nondistended no flank tenderness Skin no lower extremity edema Neuro range of motion intact  Data Reviewed: Basic Metabolic Panel:  Recent Labs Lab 05/07/14 1403 05/08/14 0300  NA 135  138  K 3.9 4.2  CL 100 107  CO2 30 25  GLUCOSE 114* 100*  BUN 41* 44*  CREATININE 2.52* 1.84*  CALCIUM 9.0 8.1*   Liver Function Tests:  Recent Labs Lab 05/07/14 1403 05/08/14 0300  AST 20 13  ALT 12 10  ALKPHOS 75 63  BILITOT 0.9 0.7  PROT 6.4 5.8*  ALBUMIN 3.3* 3.0*   No results for input(s): LIPASE, AMYLASE in the last 168 hours. No results for input(s): AMMONIA in the last 168 hours. CBC:  Recent Labs Lab 05/07/14 1403 05/08/14 0300  WBC 17.3* 12.6*  NEUTROABS 14.4*  --   HGB 13.2 11.3*  HCT 39.5 34.8*  MCV 87.4 89.5  PLT 139* 121*   Cardiac Enzymes: No results for input(s): CKTOTAL, CKMB, CKMBINDEX, TROPONINI in the last 168 hours. BNP: Invalid input(s): POCBNP CBG:  Recent Labs Lab 05/08/14 0635  GLUCAP 88    Recent Results (from the past 240 hour(s))  Surgical pcr screen     Status: None   Collection Time: 05/08/14  6:36 AM  Result Value Ref Range Status   MRSA, PCR NEGATIVE NEGATIVE Final   Staphylococcus aureus NEGATIVE NEGATIVE Final    Comment:        The Xpert SA Assay (FDA approved for NASAL specimens in patients over 30 years of age), is one component of a comprehensive surveillance program.  Test performance has been validated by [facility] for patients greater than or equal to 45 year old. It is not intended to diagnose infection nor to guide or monitor treatment.  Studies:              All Imaging reviewed and is as per above notation   Scheduled Meds: . aspirin EC  81 mg Oral Daily  . cefTRIAXone (ROCEPHIN)  IV  1 g Intravenous Q24H  . escitalopram  10 mg Oral Daily  . gabapentin  300 mg Oral TID  . heparin  5,000 Units Subcutaneous 3 times per day  . HYDROmorphone      . metoprolol tartrate  25 mg Oral Daily  . ondansetron      . pravastatin  40 mg Oral Q lunch  . sodium chloride  3 mL Intravenous Q12H   Continuous Infusions: . sodium chloride 125 mL/hr at 05/08/14 0818     Assessment/Plan:  1. Sepsis  secondary to pyelonephritis/retained left nephrolithiasis-continue Rocephin, await urine culture, CBC with differential a.m., continue IV fluid 2. AKI/Ck-Glt Equation CKD stg IV-now improved creatinine. Continue IV fluids 125 cc per hour.  Expect urine function will continue to improve  3. CAD-continue aspirin 81 daily, outpatient follow-up at [facility] 4. Depression-continue Lexapro 10 daily 5. Hypertension continue metoprolol 25 daily-we will increase amlodipine from 2.5--> 10 mg daily if blood pressure above 140/90 6. History Guillame-Barr syndrome with resulting contractures-therapy evaluation-at baseline patient gets around in a wheelchair. Continue gabapentin 300 3 times a day 7. Hyperlipidemia-continue Pravachol 40 daily 8. Brain tumor-needs further workup at [facility] and follow-up   Code Status: Full Family Communication: Discussed with wife at bedside Disposition Plan: Potential discharge one to 2 days once cultures are back unless felt differently by Urologist  Verneita Griffes, MD  [facility] Pager [phone] 05/08/2014, 2:39 PM    LOS: 1 day

## 2014-05-08 NOTE — Progress Notes (Signed)
ANTIBIOTIC CONSULT NOTE - FOLLOW UP  Pharmacy Consult for Ceftriaxone Indication: UTI  Allergies  Allergen Reactions  . Hydrocodone-Acetaminophen Other (See Comments)    Confusion, weakness, hallucinations, mental status changes  . Morphine And Related Other (See Comments)    Confusion, weakness, hallucinations, mental status changes  . Valsartan Rash    Patient Measurements: Height: 5\' 8"  (172.7 cm) Weight: 173 lb 8 oz (78.7 kg) IBW/kg (Calculated) : 68.4  Vital Signs: Temp: 97.7 F (36.5 C) (02/06 1343) Temp Source: Oral (02/06 1343) BP: 161/64 mmHg (02/06 1343) Pulse Rate: 73 (02/06 1343) Intake/Output from previous day: 02/05 0701 - 02/06 0700 In: 3093.8 [I.V.:3093.8] Out: 450 [Urine:450] Intake/Output from this shift: Total I/O In: 350 [I.V.:350] Out: 290 [Urine:290]  Labs:  Recent Labs  05/07/14 1403 05/08/14 0300  WBC 17.3* 12.6*  HGB 13.2 11.3*  PLT 139* 121*  CREATININE 2.52* 1.84*   Estimated Creatinine Clearance: 33.6 mL/min (by C-G formula based on Cr of 1.84). No results for input(s): VANCOTROUGH, VANCOPEAK, VANCORANDOM, GENTTROUGH, GENTPEAK, GENTRANDOM, TOBRATROUGH, TOBRAPEAK, TOBRARND, AMIKACINPEAK, AMIKACINTROU, AMIKACIN in the last 72 hours.    Assessment: 65 yoM presented to ED with weakness, AMS, recent diagnosis of kidney stone and UTI started on Keflex. Found to have ureteral obstruction s/p cytoscopy w/ pyelogram and left ureteral stent on 2/6 AM. Pharmacy is consulted to dose ceftriaxone.  2/5 >> ceftriaxone >>  Today, 05/08/2014:  Tmax: afebrile  WBCs: improved, 12.6  Renal: SCr improved 1.84, CrCl ~ 33  Lactic acid: 0.53 > 0.8 > 0.6  PCT: 0.48  Blood and urine cultures pending.   Goal of Therapy:  Appropriate abx dosing, eradication of infection.   Plan:  Ceftriaxone 1g IV q24h Follow up cultures, clinical course.   Gretta Arab PharmD, BCPS Pager [phone] 05/08/2014 1:56 PM

## 2014-05-08 NOTE — Anesthesia Postprocedure Evaluation (Signed)
  Anesthesia Post-op Note  Patient: Theodore Mendoza  Procedure(s) Performed: Procedure(s): CYSTOSCOPY, RETROGRADE PYELOGRAM WITH LEFT URETERAL STENT PLACEMENT (Left)  Patient Location: PACU  Anesthesia Type:General  Level of Consciousness: awake, alert , oriented and patient cooperative  Airway and Oxygen Therapy: Patient Spontanous Breathing  Post-op Pain: none  Post-op Assessment: Post-op Vital signs reviewed, Patient's Cardiovascular Status Stable, Respiratory Function Stable, Patent Airway, No signs of Nausea or vomiting and Pain level controlled  Post-op Vital Signs: stable  Last Vitals:  Filed Vitals:   05/08/14 0745  BP: 120/69  Pulse: 86  Temp: 36.2 C  Resp: 19    Complications: No apparent anesthesia complications

## 2014-05-08 NOTE — Brief Op Note (Signed)
05/07/2014 - 05/08/2014  7:38 AM  PATIENT:  Theodore Mendoza  74 y.o. male  PRE-OPERATIVE DIAGNOSIS:  Left ureteral obstruction  POST-OPERATIVE DIAGNOSIS:  Left ureteral obstruction  PROCEDURE:  Procedure(s): CYSTOSCOPY, RETROGRADE PYELOGRAM WITH LEFT URETERAL STENT PLACEMENT (Left)  SURGEON:  Surgeon(s) and Role:    * Alexis Frock, MD - Primary  PHYSICIAN ASSISTANT:   ASSISTANTS: none   ANESTHESIA:   general  EBL:     BLOOD ADMINISTERED:none  DRAINS: foley to straight drain   LOCAL MEDICATIONS USED:  NONE  SPECIMEN:  No Specimen  DISPOSITION OF SPECIMEN:  N/A  COUNTS:  YES  TOURNIQUET:  * No tourniquets in log *  DICTATION: .Other Dictation: Dictation Number [id number]  PLAN OF CARE: Admit to inpatient   PATIENT DISPOSITION:  PACU - hemodynamically stable.   Delay start of Pharmacological VTE agent (>24hrs) due to surgical blood loss or risk of bleeding: not applicable

## 2014-05-08 NOTE — Anesthesia Procedure Notes (Signed)
Procedure Name: LMA Insertion Date/Time: 05/08/2014 7:20 AM Performed by: Lajuana Carry E Pre-anesthesia Checklist: Patient identified, Emergency Drugs available, Suction available and Patient being monitored Patient Re-evaluated:Patient Re-evaluated prior to inductionOxygen Delivery Method: Circle system utilized Preoxygenation: Pre-oxygenation with 100% oxygen Intubation Type: IV induction Ventilation: Mask ventilation without difficulty LMA: LMA inserted LMA Size: 4.0 Number of attempts: 1 Placement Confirmation: positive ETCO2 Dental Injury: Teeth and Oropharynx as per pre-operative assessment

## 2014-05-08 NOTE — Transfer of Care (Signed)
Immediate Anesthesia Transfer of Care Note  Patient: Theodore Mendoza  Procedure(s) Performed: Procedure(s) (LRB): CYSTOSCOPY, RETROGRADE PYELOGRAM WITH LEFT URETERAL STENT PLACEMENT (Left)  Patient Location: PACU  Anesthesia Type: General  Level of Consciousness: sedated, patient cooperative and responds to stimulation  Airway & Oxygen Therapy: Patient Spontanous Breathing and Patient connected to face mask oxgen  Post-op Assessment: Report given to PACU RN and Post -op Vital signs reviewed and stable  Post vital signs: Reviewed and stable  Complications: No apparent anesthesia complications

## 2014-05-08 NOTE — Progress Notes (Signed)
RN gave patient a CHG bath. Wife was notified that patient was having surgery this morning. There was no response. A message was left on the answering machines both of the cell phone and the house phone.  [phone]  C [phone]

## 2014-05-08 NOTE — Progress Notes (Signed)
OT Cancellation Note  Patient Details Name: Theodore Mendoza MRN: [id number] DOB: November 01, 1940   Cancelled Treatment:    Reason Eval/Treat Not Completed: Patient at procedure or test/ unavailable.  Pt currently in periop.  NOTE:  PT IS ON STRICT BEDREST.  PLEASE UPGRADE ORDERS WHEN OT/PT CAN SEE PT.  THANK YOU.  Marius Betts 05/08/2014, 7:00 AM  Lesle Chris, OTR/L [phone] 05/08/2014

## 2014-05-08 NOTE — Progress Notes (Signed)
ANTIBIOTIC CONSULT NOTE - INITIAL  Pharmacy Consult for Ceftriaxone Indication: Urosepsis  Allergies  Allergen Reactions  . Hydrocodone-Acetaminophen Other (See Comments)    Confusion, weakness, hallucinations, mental status changes  . Morphine And Related Other (See Comments)    Confusion, weakness, hallucinations, mental status changes  . Valsartan Rash    Patient Measurements: Height: 5\' 8"  (172.7 cm) Weight: 173 lb 8 oz (78.7 kg) IBW/kg (Calculated) : 68.4 Adjusted Body Weight:   Vital Signs: Temp: 98.2 F (36.8 C) (02/06 0157) Temp Source: Oral (02/06 0157) BP: 148/60 mmHg (02/06 0157) Pulse Rate: 79 (02/06 0157) Intake/Output from previous day: 02/05 0701 - 02/06 0700 In: 2550 [I.V.:2550] Out: -  Intake/Output from this shift: Total I/O In: 2550 [I.V.:2550] Out: -   Labs:  Recent Labs  05/07/14 1403 05/08/14 0300  WBC 17.3* 12.6*  HGB 13.2 11.3*  PLT 139* 121*  CREATININE 2.52* 1.84*   Estimated Creatinine Clearance: 33.6 mL/min (by C-G formula based on Cr of 1.84). No results for input(s): VANCOTROUGH, VANCOPEAK, VANCORANDOM, GENTTROUGH, GENTPEAK, GENTRANDOM, TOBRATROUGH, TOBRAPEAK, TOBRARND, AMIKACINPEAK, AMIKACINTROU, AMIKACIN in the last 72 hours.   Microbiology: No results found for this or any previous visit (from the past 720 hour(s)).  Medical History: Past Medical History  Diagnosis Date  . Hypertension     Medications:  Anti-infectives    Start     Dose/Rate Route Frequency Ordered Stop   05/08/14 1800  cefTRIAXone (ROCEPHIN) 1 g in dextrose 5 % 50 mL IVPB  Status:  Discontinued     1 g100 mL/hr over 30 Minutes Intravenous Every 24 hours 05/07/14 1935 05/08/14 0234   05/08/14 1800  cefTRIAXone (ROCEPHIN) 2 g in dextrose 5 % 50 mL IVPB - Premix     2 g100 mL/hr over 30 Minutes Intravenous Every 24 hours 05/08/14 0234     05/08/14 0215  cefTRIAXone (ROCEPHIN) 2 g in dextrose 5 % 50 mL IVPB - Premix  Status:  Discontinued     2 g100 mL/hr  over 30 Minutes Intravenous  Once 05/08/14 0208 05/08/14 0214   05/07/14 1900  cefTRIAXone (ROCEPHIN) 2 g in dextrose 5 % 50 mL IVPB     2 g100 mL/hr over 30 Minutes Intravenous  Once 05/07/14 1854 05/07/14 1950     Assessment: Patient with urosepsis.  First dose of antibiotics already given.    Goal of Therapy:  Rocephin based on manufacturer dosing recommendations.     Plan: Ceftriaxone 2gm iv q24hr Follow up culture results  Nani Skillern Crowford 05/08/2014,4:06 AM

## 2014-05-08 NOTE — Progress Notes (Signed)
RN received patient from [facility] into room 1438.

## 2014-05-09 LAB — COMPREHENSIVE METABOLIC PANEL
ALT: 10 U/L (ref 0–53)
AST: 11 U/L (ref 0–37)
Albumin: 2.6 g/dL — ABNORMAL LOW (ref 3.5–5.2)
Alkaline Phosphatase: 58 U/L (ref 39–117)
Anion gap: 9 (ref 5–15)
BILIRUBIN TOTAL: 0.7 mg/dL (ref 0.3–1.2)
BUN: 29 mg/dL — ABNORMAL HIGH (ref 6–23)
CO2: 26 mmol/L (ref 19–32)
Calcium: 8.1 mg/dL — ABNORMAL LOW (ref 8.4–10.5)
Chloride: 109 mmol/L (ref 96–112)
Creatinine, Ser: 0.83 mg/dL (ref 0.50–1.35)
GFR calc Af Amer: >90 mL/min (ref >90)
GFR calc non Af Amer: 84 mL/min — ABNORMAL LOW (ref >90)
GLUCOSE: 100 mg/dL — AB (ref 70–99)
Potassium: 3.4 mmol/L — ABNORMAL LOW (ref 3.5–5.1)
SODIUM: 144 mmol/L (ref 135–145)
Total Protein: 5.4 g/dL — ABNORMAL LOW (ref 6.0–8.3)

## 2014-05-09 LAB — CBC WITH DIFFERENTIAL/PLATELET
BASOS ABS: 0 10*3/uL (ref 0.0–0.1)
Basophils Relative: 0 % (ref 0–1)
EOS PCT: 3 % (ref 0–5)
Eosinophils Absolute: 0.3 10*3/uL (ref 0.0–0.7)
HCT: 34.7 % — ABNORMAL LOW (ref 39.0–52.0)
Hemoglobin: 11.2 g/dL — ABNORMAL LOW (ref 13.0–17.0)
Lymphocytes Relative: 13 % (ref 12–46)
Lymphs Abs: 1.1 10*3/uL (ref 0.7–4.0)
MCH: 28.7 pg (ref 26.0–34.0)
MCHC: 32.3 g/dL (ref 30.0–36.0)
MCV: 89 fL (ref 78.0–100.0)
Monocytes Absolute: 0.8 10*3/uL (ref 0.1–1.0)
Monocytes Relative: 10 % (ref 3–12)
NEUTROS ABS: 6.4 10*3/uL (ref 1.7–7.7)
NEUTROS PCT: 74 % (ref 43–77)
Platelets: 136 10*3/uL — ABNORMAL LOW (ref 150–400)
RBC: 3.9 MIL/uL — AB (ref 4.22–5.81)
RDW: 13.7 % (ref 11.5–15.5)
WBC: 8.6 10*3/uL (ref 4.0–10.5)

## 2014-05-09 LAB — URINE CULTURE: Colony Count: 6000

## 2014-05-09 LAB — GLUCOSE, CAPILLARY: GLUCOSE-CAPILLARY: 78 mg/dL (ref 70–99)

## 2014-05-09 MED ORDER — NITROFURANTOIN MONOHYD MACRO 100 MG PO CAPS: 100.0000 mg | ORAL_CAPSULE | Freq: Two times a day (BID) | ORAL | Status: DC

## 2014-05-09 NOTE — Progress Notes (Signed)
1 Day Post-Op  Subjective:  1 - Left Nephrolithiasis with Hydronephrosis - left 7mm ureteral stones with moderate hydro and approx 36mm conglomerate left lower pole renal stones by ER CT 05/08/2014 on eval persistent left flank pain. Left uereteral stent placed 2/6 as temporizing measure.   Pre 2016 - medical passage x several. SWL x1, URS x1.   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 NO baseline obsructive voiding symptoms.   3 - Acute Renal Failure - Cr 2.52 by ER labs 05/08/14, baselie Cr <1 in 2012. Left hydro as per above and very poor PO intake past few days. Cr 2/7 0.8 after hydration / ureteral stent.   4 Bacteruria - bacteruria and leukocytosis (17k) by ER labs 05/08/2014. No hypotension / tachycardia. BCX/UCX 05/08/14 pending and now growth to date. WBC normalized 8.6 2/7.   PMH sig for Theodore Mendoza (uses wheelchiar, but otherwise very well adapted), brain tumor (follows Dr. Redmond Pulling at [facility], s/p gamma knife). His PCP is Dione Housekeeper MD.  Today Theodore Mendoza is without complaints. No fevers or refractory colic. He feels much stronger and essentially back to baseline.   Objective: Vital signs in last 24 hours: Temp:  [97.6 F (36.4 C)-98.1 F (36.7 C)] 97.6 F (36.4 C) (02/07 0515) Pulse Rate:  [63-78] 78 (02/07 0515) Resp:  [18-20] 20 (02/07 0515) BP: (128-161)/(54-71) 132/71 mmHg (02/07 0515) SpO2:  [94 %-100 %] 98 % (02/07 0515)    Intake/Output from previous day: 02/06 0701 - 02/07 0700 In: 1740 [P.O.:90; I.V.:1650] Out: 1540 [Urine:1540] Intake/Output this shift: Total I/O In: -  Out: 500 [Urine:500]  General appearance: alert, cooperative, appears stated age and stigmata of well controlled neurologic disease Head: Normocephalic, without obvious abnormality, atraumatic Nose: Nares normal. Septum midline. Mucosa normal. No drainage or sinus tenderness. Throat: lips, mucosa, and tongue normal; teeth and gums normal Neck: supple,  symmetrical, trachea midline Back: symmetric, no curvature. ROM normal. No CVA tenderness. Resp: non-labored on room air Cardio: Nl rate GI: soft, non-tender; bowel sounds normal; no masses,  no organomegaly Male genitalia: normal, foley c/d/i with clear yellow urine.  Extremities: extremities normal, atraumatic, no cyanosis or edema and some LE muscular wating and UE finger contractures, stable Pulses: 2+ and symmetric Skin: Skin color, texture, turgor normal. No rashes or lesions Neurologic: Grossly normal Incision/Wound:none  Lab Results:   Recent Labs  05/08/14 0300 05/09/14 0540  WBC 12.6* 8.6  HGB 11.3* 11.2*  HCT 34.8* 34.7*  PLT 121* 136*   BMET  Recent Labs  05/08/14 0300 05/09/14 0540  NA 138 144  K 4.2 3.4*  CL 107 109  CO2 25 26  GLUCOSE 100* 100*  BUN 44* 29*  CREATININE 1.84* 0.83  CALCIUM 8.1* 8.1*   PT/INR  Recent Labs  05/08/14 0300  LABPROT 16.6*  INR 1.33   ABG No results for input(s): PHART, HCO3 in the last 72 hours.  Invalid input(s): PCO2, PO2  Studies/Results: Ct Head Wo Contrast  05/07/2014   CLINICAL DATA:  Mental status change  EXAM: CT HEAD WITHOUT CONTRAST  TECHNIQUE: Contiguous axial images were obtained from the base of the skull through the vertex without intravenous contrast.  COMPARISON:  06/26/2010 brain MRI interpreted by [facility], report not available. No prior head CT for comparison  FINDINGS: Stable left cerebellopontine angle mass with peripheral rim calcification as previously evaluated. Mild cortical volume loss noted with proportional ventricular prominence. Areas of periventricular white matter hypodensity  are most compatible with small vessel ischemic change. No acute hemorrhage, infarct, or mass lesion is identified. No midline shift. Stable bilateral tiny basal ganglial lacunar infarcts. Mild pansinusitis with mucoperiosteal thickening but no osseous destruction. No skull fracture.  IMPRESSION:  No new acute intracranial finding.  Stable findings as above.   Electronically Signed   By: Conchita Paris M.D.   On: 05/07/2014 19:58   Dg Chest Port 1 View  05/07/2014   CLINICAL DATA:  Weakness  EXAM: PORTABLE CHEST - 1 VIEW  COMPARISON:  06/27/2010  FINDINGS: Heart size at upper limits of normal. Central vascular congestion noted without overt edema. No new pulmonary opacity. Linear left lower lobe atelectasis or scarring is noted. No pleural effusion.  IMPRESSION: Low volumes with curvilinear left lower lobe atelectasis or scarring.   Electronically Signed   By: Conchita Paris M.D.   On: 05/07/2014 19:52   Ct Renal Stone Study  05/07/2014   CLINICAL DATA:  Initial encounter for kidney stones. Urinary retention. Left flank pain and dark urine.  EXAM: CT ABDOMEN AND PELVIS WITHOUT CONTRAST  TECHNIQUE: Multidetector CT imaging of the abdomen and pelvis was performed following the standard protocol without IV contrast.  COMPARISON:  05/05/2014  FINDINGS: Lower chest: Mild left base atelectasis. Mild cardiomegaly with coronary artery atherosclerosis, including within the right coronary artery and LAD. No pericardial or pleural effusion.  Hepatobiliary: Mild hepatic steatosis. Normal gallbladder, without biliary ductal dilatation.  Pancreas: Moderate to marked pancreatic atrophy. No acute inflammation identified.  Spleen: Normal  Adrenals/Urinary Tract: Normal adrenal glands. Upper pole right renal low-density lesion which is likely a cyst. There is also a lower pole right renal cyst of 2.3 cm.  Multiple left renal collecting system calculi, including a lower pole 11 mm stone. Moderate left-sided hydroureteronephrosis is similar. This continues to the level of a proximal left ureteric 4 mm stone which may have progressed minimally. Example image 55 today. No distal ureteric or bladder stones.  Stomach/Bowel: Normal stomach, without wall thickening. Scattered colonic diverticula. Normal terminal ileum and  appendix. Normal small bowel.  Vascular/Lymphatic: Aortic and branch vessel atherosclerosis. Non aneurysmal dilatation of the infrarenal aorta at 2.5 cm unchanged. Probable calcified wall thrombus. Mild celiac origin ectasia could relate to poststenotic dilatation. No abdominopelvic adenopathy.  Reproductive: Mild prostatomegaly.  Other: Trace cul-de-sac fluid is new on image 83.  Musculoskeletal: No acute osseous abnormality.  IMPRESSION: 1. Moderate left-sided hydroureteronephrosis secondary to a proximal ureteric stone. This may have passed minimally distally since the CT of 05/05/2014 (from [facility]). 2. Left nephrolithiasis. 3.  Atherosclerosis, including within the coronary arteries. 4. Trace cul-de-sac fluid, new since the prior. 5. Prostatomegaly.   Electronically Signed   By: Abigail Miyamoto M.D.   On: 05/07/2014 21:39    Anti-infectives: Anti-infectives    Start     Dose/Rate Route Frequency Ordered Stop   05/08/14 1800  cefTRIAXone (ROCEPHIN) 1 g in dextrose 5 % 50 mL IVPB  Status:  Discontinued     1 g100 mL/hr over 30 Minutes Intravenous Every 24 hours 05/07/14 1935 05/08/14 0234   05/08/14 1800  cefTRIAXone (ROCEPHIN) 2 g in dextrose 5 % 50 mL IVPB - Premix  Status:  Discontinued     2 g100 mL/hr over 30 Minutes Intravenous Every 24 hours 05/08/14 0234 05/08/14 1357   05/08/14 1800  cefTRIAXone (ROCEPHIN) 1 g in dextrose 5 % 50 mL IVPB - Premix     1 g100 mL/hr over 30 Minutes Intravenous  Every 24 hours 05/08/14 1357     05/08/14 0215  cefTRIAXone (ROCEPHIN) 2 g in dextrose 5 % 50 mL IVPB - Premix  Status:  Discontinued     2 g100 mL/hr over 30 Minutes Intravenous  Once 05/08/14 0208 05/08/14 0214   05/07/14 1900  cefTRIAXone (ROCEPHIN) 2 g in dextrose 5 % 50 mL IVPB     2 g100 mL/hr over 30 Minutes Intravenous  Once 05/07/14 1854 05/07/14 1950      Assessment/Plan:  1 - Left Nephrolithiasis with Hydronephrosis - will need multistage management with cysto / left ureteral  stent (now done) for renal decompression in setting of bacteruria followed by likely two stage ureteroscopic stone manipulation in outpatient setting in few weeks after clears infectious parameters and GFR improved.   2 - Balanaitis Xerotica Obliterans - stable, no indication for furhter treatment. Foley can be DC'd prior to discharge. Pt prefers to keep unti hoes home given his baseline trouble with immobility, I feel this is reasonable.   3 - Acute Renal Failure - likeey multifactorial with pre-renal (poor intake) and obstructive components. Now resolved by serum markers.   4 Bacteruria - aftree with current ABX pending final CX, fortunatlye clinical signs of infection resolved.   Pt will need GU rollow up in few weeks to discuss further management of stones in elective setting. Will follow, OK for DC from GU persepctive at anytime.   [facility], Theodore Mendoza 05/09/2014

## 2014-05-09 NOTE — Evaluation (Signed)
Physical Therapy Evaluation: 1x Eval Patient Details Name: Theodore Mendoza MRN: [id number] DOB: 1940-09-30 Today's Date: 05/09/2014   History of Present Illness  Pt was admitted for pyelonephritis.  He is s/p L ureteral stent.  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  He also has a h/o brain tumor and is s/p gamma knife this past November.   Clinical Impression  Pt very pleasant and wife present during the assessment. Pt described a lot of his set up and PLOF for [place] to assess if he is at/near his baseline. He transferred to the recliner with supervision/min guard, and performed nicely. Pt and wife did not have any worries about getting home today if he is discharged. No further Acute PT needs at this time. Thank you.     Follow Up Recommendations No PT follow up (educated pt if he feels he is not back to baseline when he returns or has concerns to contact his physician to order [facility] therapy. For now pt feels he will be fine. )    Equipment Recommendations  None recommended by PT    Recommendations for Other Services       Precautions / Restrictions Precautions Precautions: Fall Restrictions Weight Bearing Restrictions: No      Mobility  Bed Mobility Overal bed mobility: Needs Assistance Bed Mobility: Sidelying to Sit   Sidelying to sit: Min guard       General bed mobility comments: tried from supine bed flat position and pt only needed a min guard for upper body due to difference in bed here.   Transfers Overall transfer level: Needs assistance   Transfers: Stand Pivot Transfers Sit to Stand: Min guard Stand pivot transfers: Min guard       General transfer comment: min guard for safety:  pt turns 270 degree turn, holding to armrests of chair.  Extra time to stand. This is his pattern for home and really completed with supervision , we were there to guard if needed [place].   Ambulation/Gait Ambulation/Gait assistance:  (pt wheelchair bound. )               Stairs            Wheelchair Mobility    Modified Rankin (Stroke Patients Only)       Balance                                             Pertinent Vitals/Pain Pain Assessment: No/denies pain    Home Living Family/patient expects to be discharged to:: Private residence Living Arrangements: Spouse/significant other Available Help at Discharge: Family Type of Home: House Home Access: [place]: One [place]: Bedside commode;Shower seat;Wheelchair - [employer] (already has DME and great set up as PLOF. ) Additional Comments: family very supportive adn pt /family knowledgabel about PLOF and feel pt reaching close to baseline.     Prior Function Level of Independence: Independent with assistive device(s)         Comments: family very supportive and pt /family knowledgabel about PLOF and feel pt reaching close to baseline.      Hand Dominance        Extremity/Trunk Assessment   Upper Extremity Assessment: Overall WFL for tasks assessed           Lower  Extremity Assessment: Overall WFL for tasks assessed (wears braces on LES for previous BLE paralysis. Good core adn trunk strength for compensation. )      Cervical / Trunk Assessment: Normal  Communication   Communication: No difficulties  Cognition Arousal/Alertness: Awake/alert Behavior During Therapy: WFL for tasks assessed/performed Overall Cognitive Status: Within Functional Limits for tasks assessed                      General Comments      Exercises        Assessment/Plan    PT Assessment Patent does not need any further PT services  PT Diagnosis Generalized weakness   PT Problem List    PT Treatment Interventions     PT Goals (Current goals can be found in the Care Plan section) Acute Rehab PT Goals Patient Stated Goal: home today PT Goal Formulation: All assessment and education complete, DC  therapy    Frequency     Barriers to discharge        Co-evaluation PT/OT/SLP Co-Evaluation/Treatment: Yes Reason for Co-Treatment: For patient/therapist safety PT goals addressed during session: Mobility/safety with mobility OT goals addressed during session: ADL's and self-care       End of Session   Activity Tolerance: Patient tolerated treatment well Patient left: in chair;with call bell/phone within reach;with family/visitor present Nurse Communication: Mobility status         Time: 1030-1053 PT Time Calculation (min) (ACUTE ONLY): 23 min   Charges:   PT Evaluation $Initial PT Evaluation Tier I: 1 Procedure     PT G CodesClide Mendoza 05/21/14, 11:50 AM  Theodore Mendoza, PT Pager: [phone] 05/21/2014

## 2014-05-09 NOTE — Evaluation (Signed)
Occupational Therapy Evaluation Patient Details Name: Theodore Mendoza MRN: [id number] DOB: 08-04-1940 Today's Date: 05/09/2014    History of Present Illness Pt was admitted for pyelonephritis.  He is s/p L ureteral stent.  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  He also has a h/o brain tumor and is s/p gamma knife   Clinical Impression   Pt was seen for initial evaluation, pt is near baseline, and he and wife feel they can manage at home.  No further OT is needed at this time.    Follow Up Recommendations  No OT follow up;Supervision/Assistance - 24 hour    Equipment Recommendations  None recommended by OT    Recommendations for Other Services       Precautions / Restrictions Precautions Precautions: Fall Restrictions Weight Bearing Restrictions: No      Mobility Bed Mobility Overal bed mobility: Needs Assistance Bed Mobility: Sidelying to Sit           General bed mobility comments: light min A to bring trunk upright  Transfers Overall transfer level: Needs assistance   Transfers: Stand Pivot Transfers;Sit to/from Stand Sit to Stand: Min guard Stand pivot transfers: Min guard       General transfer comment: min guard for safety:  pt turns 270 degree turn, holding to armrests of chair.  Extra time to stand    Balance                                            ADL Overall ADL's : Needs assistance/impaired             Lower Body Bathing: Minimal assistance;Sitting/lateral leans       Lower Body Dressing: Moderate assistance;Sitting/lateral leans   Toilet Transfer: Min guard;Stand-pivot (to recliner)             General ADL Comments: pt is usually mod I and he dons braces from recliner chair:  wife assisted him with braces today.  Pt feels he is near baseline and doesn't feel he needs follow up therapies.  Wife is very hands on and will assist as needed.  Pt's concern is getting into passenger side of [place]:   he holds onto steering wheel when he is driving:  PT talked to nursing about helping him into [place].      Vision                     Perception     Praxis      Pertinent Vitals/Pain Pain Assessment: No/denies pain     Hand Dominance     Extremity/Trunk Assessment Upper Extremity Assessment Upper Extremity Assessment: Overall WFL for tasks assessed           Communication Communication Communication: No difficulties   Cognition Arousal/Alertness: Awake/alert Behavior During Therapy: WFL for tasks assessed/performed Overall Cognitive Status: Within Functional Limits for tasks assessed                     General Comments       Exercises       Shoulder Instructions      Home Living Family/patient expects to be discharged to:: Private residence Living Arrangements: Spouse/significant other Available Help at Discharge: Family   Home Access: Ramped entrance     Home Layout: One level  Bathroom Shower/Tub:  (accessible)   Bathroom Toilet: Standard     Home Equipment: Bedside commode;Shower seat;Wheelchair - manual (has all DME)          Prior Functioning/Environment Level of Independence: Independent with assistive device(s)             OT Diagnosis: Generalized weakness   OT Problem List:     OT Treatment/Interventions:      OT Goals(Current goals can be found in the care plan section) Acute Rehab OT Goals Patient Stated Goal: home today  OT Frequency:     Barriers to D/C:            Co-evaluation PT/OT/SLP Co-Evaluation/Treatment: Yes Reason for Co-Treatment: For patient/therapist safety PT goals addressed during session: Mobility/safety with mobility OT goals addressed during session: ADL's and self-care      End of Session    Activity Tolerance: Patient tolerated treatment well Patient left: in chair;with call bell/phone within reach   Time: 1022-1053 OT Time Calculation (min): 31 min Charges:  OT  General Charges $OT Visit: 1 Procedure OT Evaluation $Initial OT Evaluation Tier I: 1 Procedure G-Codes:    Donovan Persley 2014-05-22, 11:27 AM  Lesle Chris, OTR/L [phone] May 22, 2014

## 2014-05-09 NOTE — Progress Notes (Signed)
Discharge instructions gone over with pt and family.  All questions answered.  Pt voided 110cc or urine since removed foley.  Bladder scan showed only 17cc.  Spoke with Dr. Tresa Moore about output and said was ok to send pt home with that amount voided.  VSS.  Pt wheeled out by NT.

## 2014-05-09 NOTE — Discharge Summary (Signed)
Physician Discharge Summary  Theodore Mendoza ZDG:[id number] DOB: 10-07-1940 DOA: 05/07/2014  PCP: Sherrie Mustache, MD  Admit date: 05/07/2014 Discharge date: 05/09/2014  Time spent: 35 minutes  Recommendations for Outpatient Follow-up:  1. Continue Cipro 2. Please follow urine culture as an outpatient 3. Patient will need close follow-up with Dr. Alexis Frock who will be cc on this note 4. I would recommend his metabolic panel in 1 week to ensure complete resolution of acute kidney injury 5. Please coordinate care with [facility] neurosurgery as well as Cardiology   Discharge Diagnoses:  Principal Problem:   Pyelonephritis Active Problems:   Sepsis   Essential (primary) hypertension   AKI (acute kidney injury)   Hyperlipidemia   Discharge Condition: Good  Diet recommendation: Regular  Filed Weights   05/07/14 1339 05/08/14 0157  Weight: 79.379 kg (175 lb) 78.7 kg (173 lb 8 oz)   74 y/o ? 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,CAD s/p NSTMIE 2/12/108-s/p PTCA Brain tumour sp Gamma Knife 12/2013 admitted with metabolic encephalopathy + acute kidney injury secondary to medication/pyelonephritis in a setting of left nephrolithiasis and hydronephrosis Urologist was consulted and patient underwent cystoscopy ureteric stent placement 05/08/14                             Toxic metabolic encephalopathy-secondary to sepsis and acute kidney injury-completely resolved now  Sepsis secondary to pyelonephritis/retained left nephrolithiasis-initially placed on Rocephin IV, on discharge this was transitioned to [place] 100 [there was a concern that ciprofloxacin may have caused some confusion?]-Sensitivities to be followed as an outpatient.  AKI/Ck-Glt Equation CKD stg IV-now improved creatinine. Initially was on IV fluids 125 cc per hour.    CAD-continue aspirin 81 daily, outpatient follow-up at [facility]  Depression-continue Lexapro 10 daily Hypertension continue metoprolol  25 daily-we ?  amlodipine from 2.5--> 10 mg daily.  Lisinopril was discontinued secondary to toxic metabolic encephalopathy  History Guillame-Barr syndrome with resulting contractures-therapy evaluation-at baseline patient gets around in a wheelchair. Continue gabapentin 300 3 times a day  Hyperlipidemia-continue Pravachol 40 daily  Brain tumor-needs further workup at [facility] and follow-up   Consultants:  Urology  Procedures:  Cystoscopy with left retrograde pyelogram status post left ureteric stent 6 x 24-grossly purulent urine, mild left hydroureteronephrosis with filling defect   Discharge Exam: Filed Vitals:   05/09/14 0515  BP: 132/71  Pulse: 78  Temp: 97.6 F (36.4 C)  Resp: 20    General: Alert pleasant oriented no apparent distress Cardiovascular: S1-S2 no murmur rub or gallop Respiratory: Clinically clear Contractures are at baseline  Discharge Instructions   Discharge Instructions    Diet - low sodium heart healthy    Complete by:  As directed      Discharge instructions    Complete by:  As directed   Continue Cipro for 2 days Your urine results should be followed as an outpatient See Dr. Tresa Moore at his request-he will call you Blood in urine is normal after procedure     Increase activity slowly    Complete by:  As directed           Current Discharge Medication List    START taking these medications   Details  nitrofurantoin, macrocrystal-monohydrate, (MACROBID) 100 MG capsule Take 1 capsule (100 mg total) by mouth 2 (two) times daily. Qty: 6 capsule, Refills: 0      CONTINUE these medications which have NOT CHANGED   Details  amLODipine (  NORVASC) 2.5 MG tablet Take 2.5 mg by mouth daily.    aspirin EC 81 MG tablet Take 81 mg by mouth daily.    escitalopram (LEXAPRO) 10 MG tablet Take 10 mg by mouth daily.    gabapentin (NEURONTIN) 300 MG capsule Take 300 mg by mouth 3 (three) times daily.    Ibuprofen-Diphenhydramine Cit 200-38 MG  TABS Take 1 tablet by mouth at bedtime.    metoprolol tartrate (LOPRESSOR) 25 MG tablet Take 25 mg by mouth daily.    ondansetron (ZOFRAN) 4 MG tablet Take 4 mg by mouth every 8 (eight) hours as needed for nausea or vomiting.    pravastatin (PRAVACHOL) 40 MG tablet Take 40 mg by mouth daily with lunch.    tamsulosin (FLOMAX) 0.4 MG CAPS capsule Take 0.4 mg by mouth daily.    ketorolac (TORADOL) 10 MG tablet Take 10 mg by mouth 2 (two) times daily as needed for moderate pain.      STOP taking these medications     ciprofloxacin (CIPRO) 500 MG tablet      lisinopril (PRINIVIL,ZESTRIL) 2.5 MG tablet        Allergies  Allergen Reactions  . Hydrocodone-Acetaminophen Other (See Comments)    Confusion, weakness, hallucinations, mental status changes  . Morphine And Related Other (See Comments)    Confusion, weakness, hallucinations, mental status changes  . Valsartan Rash      The results of significant diagnostics from this hospitalization (including imaging, microbiology, ancillary and laboratory) are listed below for reference.    Significant Diagnostic Studies: Ct Head Wo Contrast  05/07/2014   CLINICAL DATA:  Mental status change  EXAM: CT HEAD WITHOUT CONTRAST  TECHNIQUE: Contiguous axial images were obtained from the base of the skull through the vertex without intravenous contrast.  COMPARISON:  06/26/2010 brain MRI interpreted by [facility], report not available. No prior head CT for comparison  FINDINGS: Stable left cerebellopontine angle mass with peripheral rim calcification as previously evaluated. Mild cortical volume loss noted with proportional ventricular prominence. Areas of periventricular white matter hypodensity are most compatible with small vessel ischemic change. No acute hemorrhage, infarct, or mass lesion is identified. No midline shift. Stable bilateral tiny basal ganglial lacunar infarcts. Mild pansinusitis with mucoperiosteal thickening  but no osseous destruction. No skull fracture.  IMPRESSION: No new acute intracranial finding.  Stable findings as above.   Electronically Signed   By: Conchita Paris M.D.   On: 05/07/2014 19:58   Dg Chest Port 1 View  05/07/2014   CLINICAL DATA:  Weakness  EXAM: PORTABLE CHEST - 1 VIEW  COMPARISON:  06/27/2010  FINDINGS: Heart size at upper limits of normal. Central vascular congestion noted without overt edema. No new pulmonary opacity. Linear left lower lobe atelectasis or scarring is noted. No pleural effusion.  IMPRESSION: Low volumes with curvilinear left lower lobe atelectasis or scarring.   Electronically Signed   By: Conchita Paris M.D.   On: 05/07/2014 19:52   Ct Renal Stone Study  05/07/2014   CLINICAL DATA:  Initial encounter for kidney stones. Urinary retention. Left flank pain and dark urine.  EXAM: CT ABDOMEN AND PELVIS WITHOUT CONTRAST  TECHNIQUE: Multidetector CT imaging of the abdomen and pelvis was performed following the standard protocol without IV contrast.  COMPARISON:  05/05/2014  FINDINGS: Lower chest: Mild left base atelectasis. Mild cardiomegaly with coronary artery atherosclerosis, including within the right coronary artery and LAD. No pericardial or pleural effusion.  Hepatobiliary: Mild hepatic steatosis. Normal gallbladder,  without biliary ductal dilatation.  Pancreas: Moderate to marked pancreatic atrophy. No acute inflammation identified.  Spleen: Normal  Adrenals/Urinary Tract: Normal adrenal glands. Upper pole right renal low-density lesion which is likely a cyst. There is also a lower pole right renal cyst of 2.3 cm.  Multiple left renal collecting system calculi, including a lower pole 11 mm stone. Moderate left-sided hydroureteronephrosis is similar. This continues to the level of a proximal left ureteric 4 mm stone which may have progressed minimally. Example image 55 today. No distal ureteric or bladder stones.  Stomach/Bowel: Normal stomach, without wall thickening.  Scattered colonic diverticula. Normal terminal ileum and appendix. Normal small bowel.  Vascular/Lymphatic: Aortic and branch vessel atherosclerosis. Non aneurysmal dilatation of the infrarenal aorta at 2.5 cm unchanged. Probable calcified wall thrombus. Mild celiac origin ectasia could relate to poststenotic dilatation. No abdominopelvic adenopathy.  Reproductive: Mild prostatomegaly.  Other: Trace cul-de-sac fluid is new on image 83.  Musculoskeletal: No acute osseous abnormality.  IMPRESSION: 1. Moderate left-sided hydroureteronephrosis secondary to a proximal ureteric stone. This may have passed minimally distally since the CT of 05/05/2014 (from [facility] 2. Left nephrolithiasis. 3.  Atherosclerosis, including within the coronary arteries. 4. Trace cul-de-sac fluid, new since the prior. 5. Prostatomegaly.   Electronically Signed   By: Abigail Miyamoto M.D.   On: 05/07/2014 21:39    Microbiology: Recent Results (from the past 240 hour(s))  Blood Culture (routine x 2)     Status: None (Preliminary result)   Collection Time: 05/07/14  7:05 PM  Result Value Ref Range Status   Specimen Description BLOOD RIGHT ARM  Final   Special Requests BOTTLES DRAWN AEROBIC AND ANAEROBIC 5CC  Final   Culture   Final           BLOOD CULTURE RECEIVED NO GROWTH TO DATE CULTURE WILL BE HELD FOR 5 DAYS BEFORE ISSUING A FINAL NEGATIVE REPORT Performed at [facility]    Report Status PENDING  Incomplete  Blood Culture (routine x 2)     Status: None (Preliminary result)   Collection Time: 05/07/14  7:10 PM  Result Value Ref Range Status   Specimen Description BLOOD RIGHT ARM  Final   Special Requests BOTTLES DRAWN AEROBIC AND ANAEROBIC 5CC  Final   Culture   Final           BLOOD CULTURE RECEIVED NO GROWTH TO DATE CULTURE WILL BE HELD FOR 5 DAYS BEFORE ISSUING A FINAL NEGATIVE REPORT Performed at [facility]    Report Status PENDING  Incomplete  Surgical pcr screen     Status: None    Collection Time: 05/08/14  6:36 AM  Result Value Ref Range Status   MRSA, PCR NEGATIVE NEGATIVE Final   Staphylococcus aureus NEGATIVE NEGATIVE Final    Comment:        The Xpert SA Assay (FDA approved for NASAL specimens in patients over 61 years of age), is one component of a comprehensive surveillance program.  Test performance has been validated by [facility] for patients greater than or equal to 60 year old. It is not intended to diagnose infection nor to guide or monitor treatment.      Labs: Basic Metabolic Panel:  Recent Labs Lab 05/07/14 1403 05/08/14 0300 05/09/14 0540  NA 135 138 144  K 3.9 4.2 3.4*  CL 100 107 109  CO2 30 25 26   GLUCOSE 114* 100* 100*  BUN 41* 44* 29*  CREATININE 2.52* 1.84* 0.83  CALCIUM 9.0  8.1* 8.1*   Liver Function Tests:  Recent Labs Lab 05/07/14 1403 05/08/14 0300 05/09/14 0540  AST 20 13 11   ALT 12 10 10   ALKPHOS 75 63 58  BILITOT 0.9 0.7 0.7  PROT 6.4 5.8* 5.4*  ALBUMIN 3.3* 3.0* 2.6*   No results for input(s): LIPASE, AMYLASE in the last 168 hours. No results for input(s): AMMONIA in the last 168 hours. CBC:  Recent Labs Lab 05/07/14 1403 05/08/14 0300 05/09/14 0540  WBC 17.3* 12.6* 8.6  NEUTROABS 14.4*  --  6.4  HGB 13.2 11.3* 11.2*  HCT 39.5 34.8* 34.7*  MCV 87.4 89.5 89.0  PLT 139* 121* 136*   Cardiac Enzymes: No results for input(s): CKTOTAL, CKMB, CKMBINDEX, TROPONINI in the last 168 hours. BNP: BNP (last 3 results) No results for input(s): BNP in the last 8760 hours.  ProBNP (last 3 results) No results for input(s): PROBNP in the last 8760 hours.  CBG:  Recent Labs Lab 05/08/14 0635 05/09/14 1007  GLUCAP 88 78       Signed:  Ermina Oberman, [place]  [facility] 05/09/2014, 11:23 AM

## 2014-05-10 ENCOUNTER — Encounter (HOSPITAL_COMMUNITY): Payer: Self-pay | Admitting: Urology

## 2014-05-14 LAB — CULTURE, BLOOD (ROUTINE X 2)
CULTURE: NO GROWTH
Culture: NO GROWTH

## 2014-06-02 ENCOUNTER — Other Ambulatory Visit: Payer: Self-pay | Admitting: Urology

## 2014-06-21 ENCOUNTER — Other Ambulatory Visit (HOSPITAL_COMMUNITY): Payer: Self-pay | Admitting: *Deleted

## 2014-06-21 NOTE — Patient Instructions (Addendum)
Theodore Mendoza  06/21/2014   Your procedure is scheduled on: Monday March 28th, 2016  Report to [facility] Main  Entrance and follow signs to               [place] at 115 PM  Call this number if you have problems the morning of surgery [phone]   Remember:  Do not eat food :After Midnight Sunday night, may have clear liquids from midnight Sunday night until 915 am, nothing after 915 am Monday morning.     Take these medicines the morning of surgery with A SIP OF WATER: Amlodipine, Neurontin (Gabapentin), Metoprolol.                               You may not have any metal on your body including hair pins and              piercings  Do not wear jewelry, make-up, lotions, powders or perfumes.             Do not wear nail polish.  Do not shave  48 hours prior to surgery.              Men may shave face and neck.   Do not bring valuables to the hospital. [place].  Contacts, dentures or bridgework may not be worn into surgery.  Leave suitcase in the car. After surgery it may be brought to your room.     Patients discharged the day of surgery will not be allowed to drive home.  Name and phone number of your driver:  Special Instructions: N/A              Please read over the following fact sheets you were given: _____________________________________________________________________             [facility] - Preparing for Surgery Before surgery, you can play an important role.  Because skin is not sterile, your skin needs to be as free of germs as possible.  You can reduce the number of germs on your skin by washing with CHG (chlorahexidine gluconate) soap before surgery.  CHG is an antiseptic cleaner which kills germs and bonds with the skin to continue killing germs even after washing. Please DO NOT use if you have an allergy to CHG or antibacterial soaps.  If your skin becomes reddened/irritated  stop using the CHG and inform your nurse when you arrive at Short Stay. Do not shave (including legs and underarms) for at least 48 hours prior to the first CHG shower.  You may shave your face/neck. Please follow these instructions carefully:  1.  Shower with CHG Soap the night before surgery and the  morning of Surgery.  2.  If you choose to wash your hair, wash your hair first as usual with your  normal  shampoo.  3.  After you shampoo, rinse your hair and body thoroughly to remove the  shampoo.                           4.  Use CHG as you would any other liquid soap.  You can apply chg directly  to the skin and wash  Gently with a scrungie or clean washcloth.  5.  Apply the CHG Soap to your body ONLY FROM THE NECK DOWN.   Do not use on face/ open                           Wound or open sores. Avoid contact with eyes, ears mouth and genitals (private parts).                       Wash face,  Genitals (private parts) with your normal soap.             6.  Wash thoroughly, paying special attention to the area where your surgery  will be performed.  7.  Thoroughly rinse your body with warm water from the neck down.  8.  DO NOT shower/wash with your normal soap after using and rinsing off  the CHG Soap.                9.  Pat yourself dry with a clean towel.            10.  Wear clean pajamas.            11.  Place clean sheets on your bed the night of your first shower and do not  sleep with pets. Day of Surgery : Do not apply any lotions/deodorants the morning of surgery.  Please wear clean clothes to the hospital/surgery center.  FAILURE TO FOLLOW THESE INSTRUCTIONS MAY RESULT IN THE CANCELLATION OF YOUR SURGERY PATIENT SIGNATURE_________________________________  NURSE SIGNATURE__________________________________  ________________________________________________________________________    CLEAR LIQUID DIET   Foods Allowed                                                                      Foods Excluded  Coffee and tea, regular and decaf                             liquids that you cannot  Plain Jell-O in any flavor                                             see through such as: Fruit ices (not with fruit pulp)                                     milk, soups, orange juice  Iced Popsicles                                    All solid food Carbonated beverages, regular and diet                                    Cranberry, grape and apple juices Sports drinks like Gatorade Lightly seasoned clear broth or consume(fat free) Sugar, honey syrup  Sample Menu Breakfast                                Lunch                                     Supper Cranberry juice                    Beef broth                            Chicken broth Jell-O                                     Grape juice                           Apple juice Coffee or tea                        Jell-O                                      Popsicle                                                Coffee or tea                        Coffee or tea  _____________________________________________________________________

## 2014-06-22 ENCOUNTER — Encounter (HOSPITAL_COMMUNITY)
Admission: RE | Admit: 2014-06-22 | Discharge: 2014-06-22 | Disposition: A | Discharge status: Home / Self Care | Payer: Medicare Other | Source: Ambulatory Visit | Attending: Urology | Admitting: Urology

## 2014-06-22 ENCOUNTER — Encounter (HOSPITAL_COMMUNITY): Payer: Self-pay

## 2014-06-22 ENCOUNTER — Encounter (INDEPENDENT_AMBULATORY_CARE_PROVIDER_SITE_OTHER): Payer: Self-pay

## 2014-06-22 DIAGNOSIS — Z01812 Encounter for preprocedural laboratory examination: Secondary | ICD-10-CM | POA: Diagnosis not present

## 2014-06-22 HISTORY — DX: Guillain-Barre syndrome: G61.0

## 2014-06-22 HISTORY — DX: Acute myocardial infarction, unspecified: I21.9

## 2014-06-22 LAB — CBC
HCT: 38.6 % — ABNORMAL LOW (ref 39.0–52.0)
Hemoglobin: 12.6 g/dL — ABNORMAL LOW (ref 13.0–17.0)
MCH: 28.6 pg (ref 26.0–34.0)
MCHC: 32.6 g/dL (ref 30.0–36.0)
MCV: 87.7 fL (ref 78.0–100.0)
PLATELETS: 183 10*3/uL (ref 150–400)
RBC: 4.4 MIL/uL (ref 4.22–5.81)
RDW: 14.1 % (ref 11.5–15.5)
WBC: 5.5 10*3/uL (ref 4.0–10.5)

## 2014-06-22 LAB — BASIC METABOLIC PANEL
Anion gap: 6 (ref 5–15)
BUN: 18 mg/dL (ref 6–23)
CO2: 31 mmol/L (ref 19–32)
CREATININE: 0.62 mg/dL (ref 0.50–1.35)
Calcium: 8.9 mg/dL (ref 8.4–10.5)
Chloride: 106 mmol/L (ref 96–112)
GFR calc Af Amer: >90 mL/min (ref >90)
GFR calc non Af Amer: >90 mL/min (ref >90)
GLUCOSE: 94 mg/dL (ref 70–99)
Potassium: 3.9 mmol/L (ref 3.5–5.1)
SODIUM: 143 mmol/L (ref 135–145)

## 2014-06-22 NOTE — Progress Notes (Signed)
Chest xray 04-10-14 [facility] lov cardiology dr applegate 01-28-14 [facility] 01-28-14 [facility] 01-18-14 [facility] on chart

## 2014-06-28 ENCOUNTER — Ambulatory Visit (HOSPITAL_COMMUNITY): Payer: Medicare Other | Admitting: Certified Registered Nurse Anesthetist

## 2014-06-28 ENCOUNTER — Ambulatory Visit (HOSPITAL_COMMUNITY)
Admission: RE | Admit: 2014-06-28 | Discharge: 2014-06-28 | Disposition: A | Discharge status: Home / Self Care | Payer: Medicare Other | Source: Ambulatory Visit | Attending: Urology | Admitting: Urology

## 2014-06-28 ENCOUNTER — Encounter (HOSPITAL_COMMUNITY)
Admission: RE | Disposition: A | Discharge status: Home / Self Care | Payer: Self-pay | Source: Ambulatory Visit | Attending: Urology

## 2014-06-28 ENCOUNTER — Encounter (HOSPITAL_COMMUNITY): Payer: Self-pay | Admitting: *Deleted

## 2014-06-28 DIAGNOSIS — G709 Myoneural disorder, unspecified: Secondary | ICD-10-CM | POA: Insufficient documentation

## 2014-06-28 DIAGNOSIS — I252 Old myocardial infarction: Secondary | ICD-10-CM | POA: Insufficient documentation

## 2014-06-28 DIAGNOSIS — R531 Weakness: Secondary | ICD-10-CM | POA: Insufficient documentation

## 2014-06-28 DIAGNOSIS — N480 Leukoplakia of penis: Secondary | ICD-10-CM | POA: Diagnosis not present

## 2014-06-28 DIAGNOSIS — D496 Neoplasm of unspecified behavior of brain: Secondary | ICD-10-CM | POA: Insufficient documentation

## 2014-06-28 DIAGNOSIS — I10 Essential (primary) hypertension: Secondary | ICD-10-CM | POA: Insufficient documentation

## 2014-06-28 DIAGNOSIS — N132 Hydronephrosis with renal and ureteral calculous obstruction: Secondary | ICD-10-CM | POA: Diagnosis present

## 2014-06-28 HISTORY — PX: HOLMIUM LASER APPLICATION: SHX5852

## 2014-06-28 HISTORY — PX: STONE EXTRACTION WITH BASKET: SHX5318

## 2014-06-28 HISTORY — PX: CYSTOSCOPY WITH RETROGRADE PYELOGRAM, URETEROSCOPY AND STENT PLACEMENT: SHX5789

## 2014-06-28 SURGERY — CYSTOURETEROSCOPY, WITH RETROGRADE PYELOGRAM AND STENT INSERTION
Anesthesia: General | Site: Ureter | Laterality: Left

## 2014-06-28 MED ORDER — FENTANYL CITRATE 0.05 MG/ML IJ SOLN: 25.0000 ug | INTRAMUSCULAR | Status: DC | PRN

## 2014-06-28 MED ORDER — SODIUM CHLORIDE 0.9 % IJ SOLN
INTRAMUSCULAR | Status: AC
  Filled 2014-06-28: qty 10

## 2014-06-28 MED ORDER — SENNOSIDES-DOCUSATE SODIUM 8.6-50 MG PO TABS: 1.0000 | ORAL_TABLET | Freq: Two times a day (BID) | ORAL | Status: DC

## 2014-06-28 MED ORDER — TRAMADOL HCL 50 MG PO TABS: 50.0000 mg | ORAL_TABLET | Freq: Four times a day (QID) | ORAL | Status: DC | PRN

## 2014-06-28 MED ORDER — GENTAMICIN IN SALINE 1.6-0.9 MG/ML-% IV SOLN: 80.0000 mg | INTRAVENOUS | Status: DC

## 2014-06-28 MED ORDER — SODIUM CHLORIDE 0.9 % IR SOLN
Status: DC | PRN
  Administered 2014-06-28: 4000 mL

## 2014-06-28 MED ORDER — LIDOCAINE HCL (CARDIAC) 20 MG/ML IV SOLN
INTRAVENOUS | Status: DC | PRN
  Administered 2014-06-28: 100 mg via INTRAVENOUS

## 2014-06-28 MED ORDER — LACTATED RINGERS IV SOLN
INTRAVENOUS | Status: DC
  Administered 2014-06-28: 17:00:00 via INTRAVENOUS
  Administered 2014-06-28: 1000 mL via INTRAVENOUS

## 2014-06-28 MED ORDER — EPHEDRINE SULFATE 50 MG/ML IJ SOLN
INTRAMUSCULAR | Status: DC | PRN
  Administered 2014-06-28: 10 mg via INTRAVENOUS
  Administered 2014-06-28: 5 mg via INTRAVENOUS
  Administered 2014-06-28: 10 mg via INTRAVENOUS

## 2014-06-28 MED ORDER — PROPOFOL 10 MG/ML IV BOLUS
INTRAVENOUS | Status: AC
  Filled 2014-06-28: qty 20

## 2014-06-28 MED ORDER — FENTANYL CITRATE 0.05 MG/ML IJ SOLN
INTRAMUSCULAR | Status: DC | PRN
  Administered 2014-06-28 (×2): 25 ug via INTRAVENOUS

## 2014-06-28 MED ORDER — GENTAMICIN SULFATE 40 MG/ML IJ SOLN
5.0000 mg/kg | INTRAVENOUS | Status: AC
  Administered 2014-06-28: 390 mg via INTRAVENOUS
  Filled 2014-06-28: qty 9.75

## 2014-06-28 MED ORDER — PROMETHAZINE HCL 25 MG/ML IJ SOLN: 6.2500 mg | INTRAMUSCULAR | Status: DC | PRN

## 2014-06-28 MED ORDER — FENTANYL CITRATE 0.05 MG/ML IJ SOLN
INTRAMUSCULAR | Status: AC
  Filled 2014-06-28: qty 2

## 2014-06-28 MED ORDER — ONDANSETRON HCL 4 MG/2ML IJ SOLN
INTRAMUSCULAR | Status: AC
  Filled 2014-06-28: qty 2

## 2014-06-28 MED ORDER — ONDANSETRON HCL 4 MG/2ML IJ SOLN
INTRAMUSCULAR | Status: DC | PRN
  Administered 2014-06-28: 4 mg via INTRAVENOUS

## 2014-06-28 MED ORDER — SODIUM CHLORIDE 0.9 % IR SOLN
Status: DC | PRN
  Administered 2014-06-28: 3000 mL

## 2014-06-28 MED ORDER — LIDOCAINE HCL (CARDIAC) 20 MG/ML IV SOLN
INTRAVENOUS | Status: AC
  Filled 2014-06-28: qty 5

## 2014-06-28 MED ORDER — 0.9 % SODIUM CHLORIDE (POUR BTL) OPTIME
TOPICAL | Status: DC | PRN
  Administered 2014-06-28: 1000 mL

## 2014-06-28 MED ORDER — PROPOFOL 10 MG/ML IV BOLUS
INTRAVENOUS | Status: DC | PRN
  Administered 2014-06-28: 200 mg via INTRAVENOUS

## 2014-06-28 SURGICAL SUPPLY — 28 items
BAG URINE DRAINAGE (UROLOGICAL SUPPLIES) ×3 IMPLANT
BASKET LASER NITINOL 1.9FR (BASKET) ×2 IMPLANT
BASKET STNLS GEMINI 4WIRE 3FR (BASKET) IMPLANT
BASKET ZERO TIP NITINOL 2.4FR (BASKET) IMPLANT
BSKT STON RTRVL 120 1.9FR (BASKET) ×1
BSKT STON RTRVL GEM 120X11 3FR (BASKET)
BSKT STON RTRVL ZERO TP 2.4FR (BASKET)
CATH INTERMIT  6FR 70CM (CATHETERS) ×3 IMPLANT
CLOTH BEACON ORANGE TIMEOUT ST (SAFETY) ×3 IMPLANT
ELECT REM PT RETURN 9FT ADLT (ELECTROSURGICAL)
ELECTRODE REM PT RTRN 9FT ADLT (ELECTROSURGICAL) IMPLANT
FIBER LASER FLEXIVA 1000 (UROLOGICAL SUPPLIES) IMPLANT
FIBER LASER FLEXIVA 200 (UROLOGICAL SUPPLIES) IMPLANT
FIBER LASER FLEXIVA 365 (UROLOGICAL SUPPLIES) IMPLANT
FIBER LASER FLEXIVA 550 (UROLOGICAL SUPPLIES) IMPLANT
FIBER LASER TRAC TIP (UROLOGICAL SUPPLIES) ×2 IMPLANT
GLOVE BIOGEL M STRL SZ7.5 (GLOVE) ×3 IMPLANT
GOWN STRL REUS W/TWL LRG LVL3 (GOWN DISPOSABLE) ×6 IMPLANT
GUIDEWIRE ANG ZIPWIRE 038X150 (WIRE) ×3 IMPLANT
GUIDEWIRE STR DUAL SENSOR (WIRE) ×3 IMPLANT
IV NS IRRIG 3000ML ARTHROMATIC (IV SOLUTION) ×1 IMPLANT
PACK CYSTO (CUSTOM PROCEDURE TRAY) ×3 IMPLANT
SHEATH ACCESS URETERAL 38CM (SHEATH) ×2 IMPLANT
SHIELD EYE BINOCULAR (MISCELLANEOUS) IMPLANT
STENT CONTOUR 6FRX24X.038 (STENTS) ×2 IMPLANT
SYRINGE 10CC LL (SYRINGE) IMPLANT
SYRINGE IRR TOOMEY STRL 70CC (SYRINGE) IMPLANT
TUBE FEEDING 8FR 16IN STR KANG (MISCELLANEOUS) ×3 IMPLANT

## 2014-06-28 NOTE — Brief Op Note (Signed)
06/28/2014  4:59 PM  PATIENT:  Theodore Mendoza  74 y.o. male  PRE-OPERATIVE DIAGNOSIS:  LARGE LEFT RENAL STONES  POST-OPERATIVE DIAGNOSIS:  LARGE LEFT RENAL STONES  PROCEDURE:  Procedure(s): 1ST STAGE CYSTOSCOPY/URETEROSCOPY/STENT PLACEMENT (Left) HOLMIUM LASER APPLICATION (Left) STONE EXTRACTION WITH BASKET (Left)  SURGEON:  Surgeon(s) and Role:    * Alexis Frock, MD - Primary  PHYSICIAN ASSISTANT:   ASSISTANTS: none   ANESTHESIA:   general  EBL:  Total I/O In: 1000 [I.V.:1000] Out: -   BLOOD ADMINISTERED:none  DRAINS: none   LOCAL MEDICATIONS USED:  NONE  SPECIMEN:  Source of Specimen:  Left renal and ureteral stone fragments  DISPOSITION OF SPECIMEN:  [facility] for compositional analysis  COUNTS:  YES  TOURNIQUET:  * No tourniquets in log *  DICTATION: .Other Dictation: Dictation Number [id number]  PLAN OF CARE: Discharge to home after PACU  PATIENT DISPOSITION:  PACU - hemodynamically stable.   Delay start of Pharmacological VTE agent (>24hrs) due to surgical blood loss or risk of bleeding: not applicable

## 2014-06-28 NOTE — Transfer of Care (Signed)
Immediate Anesthesia Transfer of Care Note  Patient: Theodore Mendoza  Procedure(s) Performed: Procedure(s) (LRB): 1ST STAGE CYSTOSCOPY/URETEROSCOPY/STENT PLACEMENT (Left) HOLMIUM LASER APPLICATION (Left) STONE EXTRACTION WITH BASKET (Left)  Patient Location: PACU  Anesthesia Type: General  Level of Consciousness: sedated, patient cooperative and responds to stimulation  Airway & Oxygen Therapy: Patient Spontanous Breathing and Patient connected to face mask oxgen  Post-op Assessment: Report given to PACU RN and Post -op Vital signs reviewed and stable  Post vital signs: Reviewed and stable  Complications: No apparent anesthesia complications

## 2014-06-28 NOTE — Anesthesia Procedure Notes (Signed)
Procedure Name: LMA Insertion Date/Time: 06/28/2014 3:52 PM Performed by: Maxwell Caul Patient Re-evaluated:Patient Re-evaluated prior to inductionOxygen Delivery Method: Circle system utilized Preoxygenation: Pre-oxygenation with 100% oxygen Intubation Type: IV induction LMA: LMA inserted LMA Size: 4.0 Tube secured with: Tape Dental Injury: Teeth and Oropharynx as per pre-operative assessment

## 2014-06-28 NOTE — H&P (Signed)
Theodore Mendoza is an 74 y.o. male.    Chief Complaint: Pre-Op Left First Stage Ureteroscopic Stone Manipulation  HPI:   1 - Recurrent Nephrolithiasis - Pre 2016 - medical passage x several. SWL x1, URS x1  05/2014 - left ureteral stent placement for urosepsis for left 72mm ureteral stones with moderate hydro and approx 100mm conglomerate left lower pole renal stones by ER CT 05/08/2014 on eval persistent left flank pain.   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 NO baseline obsructive voiding symptoms.   PMH sig for Theodore Mendoza (uses wheelchiar, but otherwise very well adapted), brain tumor (follows Dr. Redmond Pulling at [facility], s/p gamma knife). His PCP is Dione Housekeeper MD.  Today Theodore Mendoza is seen to proceed with left first stage ureteroscopic stone manipulation. He has been on proph Bactrim, most recent UCX negative.   Past Medical History  Diagnosis Date  . Hypertension   . Myocardial infarction 1997  . Guillain-Barre syndrome Feb 13 1986    Past Surgical History  Procedure Laterality Date  . Cystoscopy with stent placement Left 05/08/2014    Procedure: CYSTOSCOPY, RETROGRADE PYELOGRAM WITH LEFT URETERAL STENT PLACEMENT;  Surgeon: Alexis Frock, MD;  Location: [facility];  Service: Urology;  Laterality: Left;  . Coronary angioplasty  1997    after mi  . Eye surgery Right may 2015    growth removed, july 2015left eye cataract removed, right eye catarct removed  . Gamma kniferadiation treatment  Feb 09 2014    [facility] for brain tumor  . Lithotripsy  years ago    No family history on file. Social History:  reports that he has never smoked. He has never used smokeless tobacco. He reports that he does not drink alcohol or use illicit drugs.  Allergies:  Allergies  Allergen Reactions  . Hydrocodone-Acetaminophen Other (See Comments)    Confusion, weakness, hallucinations, mental status changes  . Morphine And Related Other (See Comments)   Confusion, weakness, hallucinations, mental status changes  . Valsartan Rash    No prescriptions prior to admission    No results found for this or any previous visit (from the past 48 hour(s)). No results found.  Review of Systems  Constitutional: Negative for fever and chills.  HENT: Negative.   Eyes: Negative.   Respiratory: Negative.   Cardiovascular: Negative.   Gastrointestinal: Negative.   Genitourinary: Positive for urgency.  Musculoskeletal: Negative.   Skin: Negative.   Neurological: Negative.        Baseline GBS weakness  Endo/Heme/Allergies: Negative.   Psychiatric/Behavioral: Negative.     There were no vitals taken for this visit. Physical Exam  Constitutional: He appears well-developed.  HENT:  Head: Normocephalic.  Eyes: Pupils are equal, round, and reactive to light.  Neck: Normal range of motion.  Cardiovascular: Normal rate.   Respiratory: Effort normal.  GI: Soft.  Genitourinary: Penis normal.  No CVAT. NO recurrent high grade meatal BXO.  Musculoskeletal: Normal range of motion.  Neurological: He is alert.  LE weakness as per baseline.   Skin: Skin is warm.  Psychiatric: He has a normal mood and affect. His behavior is normal. Judgment and thought content normal.     Assessment/Plan   1 - Recurrent Nephrolithiasis - still with left ureteral and renal stones in situ, rec staged ureteroscopy, first stage 2 hrs, second stage 75 min, about 2 weeks apart. PCNL also option, but do not favor given his comorbidity.  We rediscussed ureteroscopic stone  manipulation with basketing and laser-lithotripsy in detail.  We rediscussed risks including bleeding, infection, damage to kidney / ureter  bladder, rarely loss of kidney. We rediscussed anesthetic risks and rare but serious surgical complications including DVT, PE, MI, and mortality. We specifically readdressed that in 5-10% of cases a staged approach is required with stenting followed by re-attempt  ureteroscopy if anatomy unfavorable.   The patient voiced understanding and wishes to proceed today as planned.   Detrol in interval for stent colic.   2 - Balanaitis Xerotica Obliterans - non obstructive by recent cysto, observe.      Keiffer Piper 06/28/2014, 6:20 AM

## 2014-06-28 NOTE — Anesthesia Postprocedure Evaluation (Signed)
  Anesthesia Post-op Note  Patient: Theodore Mendoza  Procedure(s) Performed: Procedure(s) (LRB): 1ST STAGE CYSTOSCOPY/URETEROSCOPY/STENT PLACEMENT (Left) HOLMIUM LASER APPLICATION (Left) STONE EXTRACTION WITH BASKET (Left)  Patient Location: PACU  Anesthesia Type: General  Level of Consciousness: awake and alert   Airway and Oxygen Therapy: Patient Spontanous Breathing  Post-op Pain: mild  Post-op Assessment: Post-op Vital signs reviewed, Patient's Cardiovascular Status Stable, Respiratory Function Stable, Patent Airway and No signs of Nausea or vomiting  Last Vitals:  Filed Vitals:   06/28/14 1740  BP: 174/66  Pulse: 56  Temp: 36.4 C  Resp: 16    Post-op Vital Signs: stable   Complications: No apparent anesthesia complications

## 2014-06-28 NOTE — Discharge Instructions (Signed)
1 - You may have urinary urgency (bladder spasms) and bloody urine on / off with stent in place. This is normal. ° °2 - Call MD or go to ER for fever >102, severe pain / nausea / vomiting not relieved by medications, or acute change in medical status ° °

## 2014-06-28 NOTE — Anesthesia Preprocedure Evaluation (Addendum)
Anesthesia Evaluation  Patient identified by MRN, date of birth, ID band Patient awake    Reviewed: Allergy & Precautions, NPO status , Patient's Chart, lab work & pertinent test results  Airway Mallampati: II  TM Distance: >3 FB Neck ROM: Full    Dental no notable dental hx.    Pulmonary neg pulmonary ROS,  breath sounds clear to auscultation  Pulmonary exam normal       Cardiovascular Exercise Tolerance: Good hypertension, Pt. on medications and Pt. on home beta blockers + Past MI Rhythm:Regular Rate:Normal     Neuro/Psych H/O Guillain-Barre syndrome 1987: still cannot walk, hands weak.  Neuromuscular disease negative psych ROS   GI/Hepatic negative GI ROS, Neg liver ROS,   Endo/Other  negative endocrine ROS  Renal/GU Renal disease  negative genitourinary   Musculoskeletal negative musculoskeletal ROS (+)   Abdominal   Peds negative pediatric ROS (+)  Hematology negative hematology ROS (+)   Anesthesia Other Findings   Reproductive/Obstetrics negative OB ROS                            Anesthesia Physical Anesthesia Plan  ASA: III  Anesthesia Plan: General   Post-op Pain Management:    Induction: Intravenous  Airway Management Planned: LMA  Additional Equipment:   Intra-op Plan:   Post-operative Plan: Extubation in OR  Informed Consent: I have reviewed the patients History and Physical, chart, labs and discussed the procedure including the risks, benefits and alternatives for the proposed anesthesia with the patient or authorized representative who has indicated his/her understanding and acceptance.   Dental advisory given  Plan Discussed with: CRNA  Anesthesia Plan Comments:         Anesthesia Quick Evaluation

## 2014-06-29 ENCOUNTER — Encounter (HOSPITAL_COMMUNITY): Payer: Self-pay | Admitting: Urology

## 2014-06-29 NOTE — Op Note (Signed)
NAMEJAILAN, TRIMM NO.:  [id number]  MEDICAL RECORD NO.:  [id number]  LOCATION:                                 FACILITY:  PHYSICIAN:  Alexis Frock, MD     DATE OF BIRTH:  [birth year]  DATE OF PROCEDURE:  06/28/2014                               OPERATIVE REPORT   DIAGNOSIS:  Left ureteral and renal stones.  PROCEDURE: 1. Cystoscopy with left retrograde pyelogram and interpretation. 2. Left ureteroscopy with laser lithotripsy. 3. Exchange of left ureteral stent 6 x 24 Contour, no tether.  FINDINGS: 1. Left ureteral stone. 2. Large lower pole left renal stone as expected. 3. Complete resolution of all left-sided urolithiasis larger than     1/3rd mm following laser lithotripsy and basketing. 4. 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  SPECIMEN:  Left renal ureteral stone for compositional analysis.  INDICATION:  Mr. Dapolito is a pleasant unfortunate 74 year old gentleman with a history of Guillain-Barre syndrome.  He was found on workup of colicky flank pain and acute renal failure to have a left ureteral stone, it was temporized with stenting.  Renal function returned to normal.  He now presents for definitive management of left-sided stone with a goal of stone free.  Has an overall somewhat large stone burden and he was counseled towards this very well likely being a stage to approach with today being the first stage.  Informed consent was obtained and placed in medical record.  PROCEDURE IN DETAIL:  The patient being Jah Pinzon was verified. Procedure being left ureteroscopic stone manipulation was confirmed. Procedure was carried out.  Time-out was performed.  Intravenous antibiotics were administered.  General LMA anesthesia was induced.  The patient was placed into a low lithotomy position.   Sterile field was created by prepping and draping the patient's penis, perineum, and proximal thighs using iodine x3.  Next, cystourethroscopy was performed using a 23-French rigid scope with 30-degree offset lens.  Inspection of the anterior-posterior urethra unremarkable.  Inspection of the urinary bladder revealed no diverticula, calcifications, or papular lesions. The distal end of left ureteral stent was seen in situ.  This was grasped with cold graspers and brought to the level of the urethral meatus where it was exchanged for a 6-French end-hole catheter over a zip working wire, and left retrograde pyelogram was obtained.  Left retrograde pyelogram demonstrated a single left ureter, single system left kidney.  There was a filling defect in the ureter consistent with known stone.  There was mild hydroureteronephrosis above this and filling defect in the lower pole consistent with known stone.  A 0.038 ZIPwire was once again advanced at the level of the upper pole and set aside as a safety wire.  An 8-French feeding tube was placed in urinary bladder for pressure release.  Next, semi-rigid ureteroscopy was performed in the entire length of the left ureter alongside a separate Sensor working wire.  The ureteral stone in question was indeed encountered.  This was amenable  to simple basketing was grasped on its long axis, brought out in its entirety, set aside for compositional analysis.  The Sensor wire was once again advanced via the semi-rigid ureteroscope and the ureteroscope was exchanged for a 12/14, 38-cm ureteral access sheath.  Next, flexible digital ureteroscopy was performed to allow inspection of the proximal left ureter and pan endoscopic examination of the left kidney.  There was a dominant left lower pole stone as expected, this appeared to be much too large for simple basketing.  As such, holmium laser energy applied to the stone using settings of 0.3 joules and 20  hertz.  Dusting approximately 70% of its volume, remaining 30% was fragmented into pieces, approximately 1-2 mm.  These were then sequentially grasped with an escape-type basket brought out their entirety, set aside for compositional analysis.  Final inspection of the left kidney revealed complete resolution of all stone fragments larger than 1/3rd mm.  There was excellent hemostasis.  No evidence of perforation.  The access sheath was removed under continuous ureteroscopic vision.  No mucosal abnormalities were found.  The large stone volume was felt that continued ureteral stenting would be warranted to help passage of small fragments.  As such, a new 6 x 24 Contour-type stent was placed using cystoscopic and fluoroscopic guidance over the remaining safety wire.  Good proximal and distal deployment were noted.  Bladder was emptied per cystoscope.  Procedure was then terminated.  The patient tolerated the procedure well.  There were no immediate periprocedural complications.  The patient was taken to postanesthesia care unit in stable condition.          ______________________________ Alexis Frock, MD     TM/MEDQ  D:  06/28/2014  T:  06/29/2014  Job:  [id number]

## 2014-07-14 ENCOUNTER — Ambulatory Visit (HOSPITAL_COMMUNITY): Admission: RE | Admit: 2014-07-14 | Payer: Medicare Other | Source: Ambulatory Visit | Admitting: Urology

## 2014-07-14 ENCOUNTER — Encounter (HOSPITAL_COMMUNITY): Admission: RE | Payer: Self-pay | Source: Ambulatory Visit

## 2014-07-14 SURGERY — CYSTOURETEROSCOPY, WITH RETROGRADE PYELOGRAM AND STENT INSERTION
Anesthesia: Choice | Laterality: Left

## 2015-02-03 DIAGNOSIS — I2510 Atherosclerotic heart disease of native coronary artery without angina pectoris: Secondary | ICD-10-CM | POA: Insufficient documentation

## 2015-10-01 ENCOUNTER — Observation Stay (HOSPITAL_COMMUNITY)
Admission: EM | Admit: 2015-10-01 | Discharge: 2015-10-04 | Disposition: A | Discharge status: Home / Self Care | Payer: Medicare Other | Attending: Internal Medicine | Admitting: Internal Medicine

## 2015-10-01 ENCOUNTER — Encounter (HOSPITAL_COMMUNITY): Payer: Self-pay | Admitting: Emergency Medicine

## 2015-10-01 ENCOUNTER — Emergency Department (HOSPITAL_COMMUNITY): Payer: Medicare Other

## 2015-10-01 DIAGNOSIS — I2510 Atherosclerotic heart disease of native coronary artery without angina pectoris: Secondary | ICD-10-CM | POA: Insufficient documentation

## 2015-10-01 DIAGNOSIS — G8250 Quadriplegia, unspecified: Secondary | ICD-10-CM | POA: Diagnosis not present

## 2015-10-01 DIAGNOSIS — R9401 Abnormal electroencephalogram [EEG]: Secondary | ICD-10-CM | POA: Insufficient documentation

## 2015-10-01 DIAGNOSIS — Z993 Dependence on wheelchair: Secondary | ICD-10-CM | POA: Diagnosis not present

## 2015-10-01 DIAGNOSIS — G8222 Paraplegia, incomplete: Secondary | ICD-10-CM | POA: Diagnosis not present

## 2015-10-01 DIAGNOSIS — I252 Old myocardial infarction: Secondary | ICD-10-CM | POA: Insufficient documentation

## 2015-10-01 DIAGNOSIS — E785 Hyperlipidemia, unspecified: Secondary | ICD-10-CM | POA: Diagnosis not present

## 2015-10-01 DIAGNOSIS — R4781 Slurred speech: Secondary | ICD-10-CM | POA: Insufficient documentation

## 2015-10-01 DIAGNOSIS — D329 Benign neoplasm of meninges, unspecified: Secondary | ICD-10-CM | POA: Insufficient documentation

## 2015-10-01 DIAGNOSIS — Z8673 Personal history of transient ischemic attack (TIA), and cerebral infarction without residual deficits: Secondary | ICD-10-CM | POA: Diagnosis not present

## 2015-10-01 DIAGNOSIS — Z7982 Long term (current) use of aspirin: Secondary | ICD-10-CM | POA: Insufficient documentation

## 2015-10-01 DIAGNOSIS — G650 Sequelae of Guillain-Barre syndrome: Secondary | ICD-10-CM | POA: Diagnosis not present

## 2015-10-01 DIAGNOSIS — R531 Weakness: Secondary | ICD-10-CM | POA: Insufficient documentation

## 2015-10-01 DIAGNOSIS — R471 Dysarthria and anarthria: Principal | ICD-10-CM | POA: Insufficient documentation

## 2015-10-01 DIAGNOSIS — Z87442 Personal history of urinary calculi: Secondary | ICD-10-CM | POA: Diagnosis not present

## 2015-10-01 DIAGNOSIS — Z885 Allergy status to narcotic agent status: Secondary | ICD-10-CM | POA: Diagnosis not present

## 2015-10-01 DIAGNOSIS — G459 Transient cerebral ischemic attack, unspecified: Secondary | ICD-10-CM | POA: Diagnosis present

## 2015-10-01 DIAGNOSIS — Z888 Allergy status to other drugs, medicaments and biological substances status: Secondary | ICD-10-CM | POA: Diagnosis not present

## 2015-10-01 DIAGNOSIS — I10 Essential (primary) hypertension: Secondary | ICD-10-CM | POA: Diagnosis not present

## 2015-10-01 LAB — URINALYSIS, ROUTINE W REFLEX MICROSCOPIC
Bilirubin Urine: NEGATIVE
Glucose, UA: NEGATIVE mg/dL
Hgb urine dipstick: NEGATIVE
Ketones, ur: NEGATIVE mg/dL
Leukocytes, UA: NEGATIVE
NITRITE: NEGATIVE
PH: 6.5 (ref 5.0–8.0)
Protein, ur: NEGATIVE mg/dL
SPECIFIC GRAVITY, URINE: 1.019 (ref 1.005–1.030)

## 2015-10-01 LAB — RAPID URINE DRUG SCREEN, HOSP PERFORMED
Amphetamines: NOT DETECTED
BENZODIAZEPINES: NOT DETECTED
Barbiturates: NOT DETECTED
Cocaine: NOT DETECTED
OPIATES: NOT DETECTED
Tetrahydrocannabinol: NOT DETECTED

## 2015-10-01 LAB — PROTIME-INR
INR: 1.14 (ref 0.00–1.49)
PROTHROMBIN TIME: 14.3 s (ref 11.6–15.2)

## 2015-10-01 LAB — I-STAT CHEM 8, ED
BUN: 24 mg/dL — AB (ref 6–20)
CALCIUM ION: 1.2 mmol/L (ref 1.12–1.23)
CHLORIDE: 103 mmol/L (ref 101–111)
Creatinine, Ser: 0.7 mg/dL (ref 0.61–1.24)
Glucose, Bld: 148 mg/dL — ABNORMAL HIGH (ref 65–99)
HEMATOCRIT: 40 % (ref 39.0–52.0)
Hemoglobin: 13.6 g/dL (ref 13.0–17.0)
POTASSIUM: 4.3 mmol/L (ref 3.5–5.1)
Sodium: 141 mmol/L (ref 135–145)
TCO2: 32 mmol/L (ref 0–100)

## 2015-10-01 LAB — COMPREHENSIVE METABOLIC PANEL
ALT: 11 U/L — ABNORMAL LOW (ref 17–63)
AST: 15 U/L (ref 15–41)
Albumin: 4.1 g/dL (ref 3.5–5.0)
Alkaline Phosphatase: 64 U/L (ref 38–126)
Anion gap: 6 (ref 5–15)
BUN: 21 mg/dL — ABNORMAL HIGH (ref 6–20)
CO2: 27 mmol/L (ref 22–32)
Calcium: 8.9 mg/dL (ref 8.9–10.3)
Chloride: 107 mmol/L (ref 101–111)
Creatinine, Ser: 0.7 mg/dL (ref 0.61–1.24)
GFR calc Af Amer: >60 mL/min (ref >60)
GFR calc non Af Amer: >60 mL/min (ref >60)
Glucose, Bld: 149 mg/dL — ABNORMAL HIGH (ref 65–99)
Potassium: 4 mmol/L (ref 3.5–5.1)
Sodium: 140 mmol/L (ref 135–145)
Total Bilirubin: 0.8 mg/dL (ref 0.3–1.2)
Total Protein: 6.4 g/dL — ABNORMAL LOW (ref 6.5–8.1)

## 2015-10-01 LAB — CBC
HCT: 39.5 % (ref 39.0–52.0)
Hemoglobin: 13.3 g/dL (ref 13.0–17.0)
MCH: 30.9 pg (ref 26.0–34.0)
MCHC: 33.7 g/dL (ref 30.0–36.0)
MCV: 91.6 fL (ref 78.0–100.0)
Platelets: 206 10*3/uL (ref 150–400)
RBC: 4.31 MIL/uL (ref 4.22–5.81)
RDW: 14.3 % (ref 11.5–15.5)
WBC: 8.3 10*3/uL (ref 4.0–10.5)

## 2015-10-01 LAB — DIFFERENTIAL
BASOS PCT: 1 %
Basophils Absolute: 0.1 10*3/uL (ref 0.0–0.1)
Eosinophils Absolute: 0.6 10*3/uL (ref 0.0–0.7)
Eosinophils Relative: 7 %
Lymphocytes Relative: 27 %
Lymphs Abs: 2.2 10*3/uL (ref 0.7–4.0)
MONO ABS: 0.5 10*3/uL (ref 0.1–1.0)
Monocytes Relative: 6 %
Neutro Abs: 4.9 10*3/uL (ref 1.7–7.7)
Neutrophils Relative %: 59 %

## 2015-10-01 LAB — APTT: aPTT: 30 seconds (ref 24–37)

## 2015-10-01 LAB — ETHANOL: Alcohol, Ethyl (B): <5 mg/dL (ref <5)

## 2015-10-01 LAB — I-STAT TROPONIN, ED: Troponin i, poc: 0.01 ng/mL (ref 0.00–0.08)

## 2015-10-01 NOTE — ED Provider Notes (Signed)
CSN: [id number]     Arrival date & time 10/01/15  2039 History   First MD Initiated Contact with Patient 10/01/15 2046     Chief Complaint  Patient presents with  . Code Stroke   HPI Patient presents to the emergency room for evaluation of right-sided weakness and left facial droop. he states that about 3 hours ago he sat down in a recliner. That was the last time hesure that he was feeling fine. About 1-1/2 hours ago when he went to get up he noticed that he could not use his right arm properly. His wife also noticed that his left side of his face was drooping and his speech seemed a bit slurred. Patient denies any trouble with headache. No chest pain or shortness of breath. No symptoms on his left side. Past Medical History  Diagnosis Date  . Hypertension   . Myocardial infarction ([place]) 1997  . Guillain-Barre syndrome ([place]) Feb 13 1986   Past Surgical History  Procedure Laterality Date  . Cystoscopy with stent placement Left 05/08/2014    Procedure: CYSTOSCOPY, RETROGRADE PYELOGRAM WITH LEFT URETERAL STENT PLACEMENT;  Surgeon: Alexis Frock, MD;  Location: [facility];  Service: Urology;  Laterality: Left;  . Coronary angioplasty  1997    after mi  . Eye surgery Right may 2015    growth removed, july 2015left eye cataract removed, right eye catarct removed  . Gamma kniferadiation treatment  Feb 09 2014    [facility]  . Lithotripsy  years ago  . Cystoscopy with retrograde pyelogram, ureteroscopy and stent placement Left 06/28/2014    Procedure: 1ST STAGE CYSTOSCOPY/URETEROSCOPY/STENT PLACEMENT;  Surgeon: Alexis Frock, MD;  Location: [facility];  Service: Urology;  Laterality: Left;  . Holmium laser application Left [id number]    Procedure: HOLMIUM LASER APPLICATION;  Surgeon: Alexis Frock, MD;  Location: [facility];  Service: Urology;  Laterality: Left;  . Stone extraction with basket Left 06/28/2014    Procedure: STONE EXTRACTION WITH BASKET;  Surgeon: Alexis Frock, MD;  Location: [facility];  Service: Urology;  Laterality: Left;   History reviewed. No pertinent family history. Social History  Substance Use Topics  . Smoking status: Never Smoker   . Smokeless tobacco: Never Used  . Alcohol Use: No    Review of Systems  All other systems reviewed and are negative.     Allergies  Hydrocodone-acetaminophen; Morphine and related; and Valsartan  Home Medications   Prior to Admission medications   Medication Sig Start Date End Date Taking? Authorizing Provider  amLODipine (NORVASC) 2.5 MG tablet Take 2.5 mg by mouth every morning.    Yes Historical Provider, MD  aspirin EC 81 MG tablet Take 81 mg by mouth every morning.    Yes Historical Provider, MD  escitalopram (LEXAPRO) 10 MG tablet Take 10 mg by mouth at bedtime.    Yes Historical Provider, MD  gabapentin (NEURONTIN) 300 MG capsule Take 300 mg by mouth 3 (three) times daily.   Yes Historical Provider, MD  lisinopril (PRINIVIL,ZESTRIL) 2.5 MG tablet Take 2.5 mg by mouth every morning.   Yes Historical Provider, MD  metoprolol tartrate (LOPRESSOR) 25 MG tablet Take 25 mg by mouth every morning.    Yes Historical Provider, MD  naproxen sodium (ANAPROX) 220 MG tablet Take 220 mg by mouth 2 (two) times daily as needed (headache.).   Yes Historical Provider, MD  pravastatin (PRAVACHOL) 40 MG tablet Take 40 mg by mouth daily with lunch.   Yes Historical  Provider, MD   BP 170/78 mmHg  Pulse 67  Temp(Src) 97.8 F (36.6 C) (Oral)  Resp 23  Ht 5\' 8"  (1.727 m)  Wt 78.472 kg  BMI 26.31 kg/m2  SpO2 98% Physical Exam  Constitutional: He is oriented to person, place, and time. He appears well-developed and well-nourished. No distress.  HENT:  Head: Normocephalic and atraumatic.  Right Ear: External ear normal.  Left Ear: External ear normal.  Mouth/Throat: Oropharynx is clear and moist.  Eyes: Conjunctivae are normal. Right eye exhibits no discharge. Left eye exhibits no discharge. No scleral icterus.  Neck: Neck  supple. No tracheal deviation present.  Cardiovascular: Normal rate, regular rhythm and intact distal pulses.   Pulmonary/Chest: Effort normal and breath sounds normal. No stridor. No respiratory distress. He has no wheezes. He has no rales.  Abdominal: Soft. Bowel sounds are normal. He exhibits no distension. There is no tenderness. There is no rebound and no guarding.  Musculoskeletal: He exhibits edema (Bilateral). He exhibits no tenderness.  Neurological: He is alert and oriented to person, place, and time. He displays atrophy (notable in the hands). No cranial nerve deficit ( left facial droop, extraocular movements intact, ?mild slurring of speech) or sensory deficit. He exhibits normal muscle tone. He displays no seizure activity. Coordination normal.  No pronator drift bilateral upper extrem, able to hold both legs off bed for 5 seconds, sensation intact in all extremities, no visual field cuts, no left or right sided neglect,no nystagmus noted   Skin: Skin is warm and dry. No rash noted.  Psychiatric: He has a normal mood and affect.  Nursing note and vitals reviewed.   ED Course  Procedures (including critical care time) Labs Review Labs Reviewed  COMPREHENSIVE METABOLIC PANEL - Abnormal; Notable for the following:    Glucose, Bld 149 (*)    BUN 21 (*)    Total Protein 6.4 (*)    ALT 11 (*)    All other components within normal limits  I-STAT CHEM 8, ED - Abnormal; Notable for the following:    BUN 24 (*)    Glucose, Bld 148 (*)    All other components within normal limits  ETHANOL  PROTIME-INR  APTT  CBC  DIFFERENTIAL  URINE RAPID DRUG SCREEN, HOSP PERFORMED  URINALYSIS, ROUTINE W REFLEX MICROSCOPIC (NOT AT [facility])  Randolm Idol, ED    Imaging Review Ct Head Code Stroke W/o Cm  10/01/2015  CLINICAL DATA:  Right-sided weakness. Left-sided facial droop. Onset 19:00. EXAM: CT HEAD WITHOUT CONTRAST TECHNIQUE: Contiguous axial images were obtained from the base of the  skull through the vertex without intravenous contrast. 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 There is no intracranial hemorrhage or evidence of acute infarction. There is mild generalized atrophy. There is mild chronic microvascular ischemic change. There are remote lacunar infarctions in the left basal ganglia. There is no significant extra-axial fluid collection. No acute intracranial findings are evident. The calvarium and skullbase are intact. Visible paranasal sinuses and orbits are unremarkable. IMPRESSION: 1. Left cerebellopontine angle mass, very slowly enlarging from 2012 although there is little change from 05/07/2014. 2. Chronic white matter hypodensities consistent with chronic small vessel ischemic disease. Remote left basal ganglia infarctions. No acute findings. Electronically Signed   By: Andreas Newport M.D.   On: 10/01/2015 21:14   I have personally reviewed and  evaluated these images and lab results as part of my medical decision-making.   EKG Interpretation   Date/Time:  Saturday October 01 2015 20:44:34 EDT Ventricular Rate:  64 PR Interval:    QRS Duration: 106 QT Interval:  405 QTC Calculation: 418 R Axis:     Text Interpretation:  Sinus rhythm Abnormal R-wave progression, early  transition No old tracing to compare Confirmed by Josecarlos Harriott  MD-J, Phong Isenberg [id number])  on 10/01/2015 8:46:34 PM      MDM   Final diagnoses:  Transient cerebral ischemia, unspecified transient cerebral ischemia type    2105  code stroke was activated after I evaluated the patient.  However the patient does admit that his symptoms are improving. He is able to move his right hand and arm normally now but she was not able to do initially. I spoke with Dr. Nicole Kindred, neurology. We will cancel the code stroke for now since his symptoms are improving. If  he has any worsening or return of symptoms we will reactivate the code stroke.   2240  Sx have not recurred.  Discussed findings with patient.  He is aware of the tumors.  Pt was diagnosed with meningiomas in the past.  Will consult with medical service for further TIA workup    Dorie Rank, MD 10/01/15 2246

## 2015-10-01 NOTE — H&P (Addendum)
Theodore Mendoza [id number] DOB: 30-Apr-1940 DOA: 10/01/2015     PCP: Sherrie Mustache, MD   Outpatient Specialists: Urology Manny Patient coming from: home Lives  With family    Chief Complaint: Right arm weakness and left facial droop  HPI: Theodore Mendoza is a 75 y.o. male with medical history significant of HTN, HL, recurrent nephrolithiasis, Lance Coon (uses wheelchair), brain tumor (follows Dr. Redmond Pulling at [facility], s/p gamma knife).    CAD sp MI  Presented with right arm weakness and left facial droop with that occurred around 7 PM. Patient was sitting in recliner he is sent down around 5:45 PM and at 7 PM try to get up but could not. Seems like his right hand was not working properly. His speech was somewhat slurred Family also noticed left facial droop. 911 was called patient denies any fevers or chills nausea vomiting or chest pain associated this Of note patient has history of Guillone Barre syndrome 30 years ago had to be intubated for 8 months. Chronically in a wheelchair. Has chronic contractures of hands.  He has known hx of meningioma followed by [facility].   He has history of recurrent kidney stones associated with urinary tract infections sepsis status post stent placement closed followed up by urology   IN ER: Patient was brought in to [facility] emergency department as a code stroke. 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 but they do change since 2016 no evidence of CVA Dr Nicole Kindred with neurology was consulted commended cancellation e of code stroke In afebrile heart rate 67 respirations 27 blood pressure 170/78 WBC 8.3 hemoglobin 13.3 BUN 21 creatinine 0.7 glucose 149 troponin 0.01   Hospitalist was called for admission for TIA  Review of Systems:    Pertinent positives include: Slurred speech, left facial droop, right hand weakness  Constitutional:  No  weight loss, night sweats, Fevers, chills, fatigue, weight loss  HEENT:  No headaches, Difficulty swallowing,Tooth/dental problems,Sore throat,  No sneezing, itching, ear ache, nasal congestion, post nasal drip,  Cardio-vascular:  No chest pain, Orthopnea, PND, anasarca, dizziness, palpitations.no Bilateral lower extremity swelling  GI:  No heartburn, indigestion, abdominal pain, nausea, vomiting, diarrhea, change in bowel habits, loss of appetite, melena, blood in stool, hematemesis Resp:  no shortness of breath at rest. No dyspnea on exertion, No excess mucus, no productive cough, No non-productive cough, No coughing up of blood.No change in color of mucus.No wheezing. Skin:  no rash or lesions. No jaundice GU:  no dysuria, change in color of urine, no urgency or frequency. No straining to urinate.  No flank pain.  Musculoskeletal:  No joint pain or no joint swelling. No decreased range of motion. No back pain.  Psych:  No change in mood or affect. No depression or anxiety. No memory loss.  Neuro: no localizing neurological complaints, no tingling, no weakness, no double vision, no gait abnormality, no slurred speech, no confusion  As per HPI otherwise 10 point review of systems negative.   Past Medical History: Past Medical History  Diagnosis Date  . Hypertension   . Myocardial infarction ([place]) 1997  . Guillain-Barre syndrome ([place]) Feb 13 1986   Past Surgical History  Procedure Laterality Date  . Cystoscopy with stent placement Left 05/08/2014    Procedure: CYSTOSCOPY, RETROGRADE PYELOGRAM WITH LEFT URETERAL STENT PLACEMENT;  Surgeon: Alexis Frock, MD;  Location: [facility];  Service: Urology;  Laterality: Left;  . Coronary angioplasty  1997    after mi  . Eye surgery Right may 2015    growth removed, july 2015left eye cataract removed, right eye catarct removed  . Gamma kniferadiation treatment  Feb 09 2014    [facility]  . Lithotripsy  years ago  . Cystoscopy  with retrograde pyelogram, ureteroscopy and stent placement Left 06/28/2014    Procedure: 1ST STAGE CYSTOSCOPY/URETEROSCOPY/STENT PLACEMENT;  Surgeon: Alexis Frock, MD;  Location: [facility];  Service: Urology;  Laterality: Left;  . Holmium laser application Left [id number]    Procedure: HOLMIUM LASER APPLICATION;  Surgeon: Alexis Frock, MD;  Location: [facility];  Service: Urology;  Laterality: Left;  . Stone extraction with basket Left 06/28/2014    Procedure: STONE EXTRACTION WITH BASKET;  Surgeon: Alexis Frock, MD;  Location: [facility];  Service: Urology;  Laterality: Left;     Social History:  Ambulatory    wheelchair bound     reports that he has never smoked. He has never used smokeless tobacco. He reports that he does not drink alcohol or use illicit drugs.  Allergies:   Allergies  Allergen Reactions  . Hydrocodone-Acetaminophen Other (See Comments)    Confusion, weakness, hallucinations, mental status changes  . Morphine And Related Other (See Comments)    Confusion, weakness, hallucinations, mental status changes  . Valsartan Rash       Family History:    Family History  Problem Relation Age of Onset  . Diabetes Mother   . Lung cancer Mother   . CAD Father   . Diabetes Brother   . Stroke Neg Hx     Medications: Prior to Admission medications   Medication Sig Start Date End Date Taking? Authorizing Provider  amLODipine (NORVASC) 2.5 MG tablet Take 2.5 mg by mouth every morning.    Yes Historical Provider, MD  aspirin EC 81 MG tablet Take 81 mg by mouth every morning.    Yes Historical Provider, MD  escitalopram (LEXAPRO) 10 MG tablet Take 10 mg by mouth at bedtime.    Yes Historical Provider, MD  gabapentin (NEURONTIN) 300 MG capsule Take 300 mg by mouth 3 (three) times daily.   Yes Historical Provider, MD  lisinopril (PRINIVIL,ZESTRIL) 2.5 MG tablet Take 2.5 mg by mouth every morning.   Yes Historical Provider, MD  metoprolol tartrate (LOPRESSOR) 25 MG tablet Take 25  mg by mouth every morning.    Yes Historical Provider, MD  naproxen sodium (ANAPROX) 220 MG tablet Take 220 mg by mouth 2 (two) times daily as needed (headache.).   Yes Historical Provider, MD  pravastatin (PRAVACHOL) 40 MG tablet Take 40 mg by mouth daily with lunch.   Yes Historical Provider, MD    Physical Exam: Patient Vitals for the past 24 hrs:  BP Temp Temp src Pulse Resp SpO2 Height Weight  10/01/15 2230 170/78 mmHg - - 67 23 98 % - -  10/01/15 2215 160/80 mmHg - - 65 13 98 % - -  10/01/15 2200 159/75 mmHg - - 64 20 97 % - -  10/01/15 2158 156/76 mmHg - - 64 18 96 % - -  10/01/15 2145 156/72 mmHg - - 65 20 97 % - -  10/01/15 2130 155/67 mmHg - - 63 (!) 27 97 % - -  10/01/15 2105 153/75 mmHg - - 65 19 95 % - -  10/01/15 2046 159/73 mmHg 97.8 F (36.6 C) Oral 66 19 98 % - -  10/01/15 2045 159/73 mmHg 97.8 F (36.6 C) Oral 64 18 95 % 5\' 8"  (1.727 m) 78.472 kg (173 lb)    1. General:  in No Acute distress 2. Psychological: Alert and   Oriented 3. Head/ENT:     Dry Mucous Membranes                          Head Non traumatic, neck supple                          Normal  Dentition 4. SKIN:   decreased Skin turgor,  Skin clean Dry and intact no rash 5. Heart: Regular rate and rhythm no  Murmur, Rub or gallop 6. Lungs:   no wheezes or crackles   7. Abdomen: Soft, non-tender, Non distended 8. Lower extremities: no clubbing, cyanosis, or edema 9. Neurologically Strength 5 out of 5 in upper extremities, chronically unable to make fist,   cranial nerves II through XII intact except for remaining questionable left facial droop, states chronic but unsure if worse tonight.  10. MSK: Normal range of motion   body mass index is 26.31 kg/(m^2).  Labs on Admission:   Labs on Admission: I have personally reviewed following labs and imaging studies  CBC:  Recent Labs Lab 10/01/15 2053 10/01/15 2101  WBC 8.3  --   NEUTROABS 4.9  --   HGB 13.3 13.6  HCT 39.5 40.0  MCV 91.6  --     PLT 206  --    Basic Metabolic Panel:  Recent Labs Lab 10/01/15 2053 10/01/15 2101  NA 140 141  K 4.0 4.3  CL 107 103  CO2 27  --   GLUCOSE 149* 148*  BUN 21* 24*  CREATININE 0.70 0.70  CALCIUM 8.9  --    GFR: Estimated Creatinine Clearance: 74.8 mL/min (by C-G formula based on Cr of 0.7). Liver Function Tests:  Recent Labs Lab 10/01/15 2053  AST 15  ALT 11*  ALKPHOS 64  BILITOT 0.8  PROT 6.4*  ALBUMIN 4.1   No results for input(s): LIPASE, AMYLASE in the last 168 hours. No results for input(s): AMMONIA in the last 168 hours. Coagulation Profile:  Recent Labs Lab 10/01/15 2053  INR 1.14   Cardiac Enzymes: No results for input(s): CKTOTAL, CKMB, CKMBINDEX, TROPONINI in the last 168 hours. BNP (last 3 results) No results for input(s): PROBNP in the last 8760 hours. HbA1C: No results for input(s): HGBA1C in the last 72 hours. CBG: No results for input(s): GLUCAP in the last 168 hours. Lipid Profile: No results for input(s): CHOL, HDL, LDLCALC, TRIG, CHOLHDL, LDLDIRECT in the last 72 hours. Thyroid Function Tests: No results for input(s): TSH, T4TOTAL, FREET4, T3FREE, THYROIDAB in the last 72 hours. Anemia Panel: No results for input(s): VITAMINB12, FOLATE, FERRITIN, TIBC, IRON, RETICCTPCT in the last 72 hours. Urine analysis:    Component Value Date/Time   COLORURINE RED* 05/07/2014 1838   APPEARANCEUR TURBID* 05/07/2014 1838   LABSPEC 1.020 05/07/2014 1838   PHURINE 5.0 05/07/2014 1838   GLUCOSEU NEGATIVE 05/07/2014 1838   HGBUR LARGE* 05/07/2014 1838   BILIRUBINUR MODERATE* 05/07/2014 1838   KETONESUR 15* 05/07/2014 1838   PROTEINUR >300* 05/07/2014 1838   UROBILINOGEN 1.0 05/07/2014 1838   NITRITE POSITIVE* 05/07/2014 1838   LEUKOCYTESUR LARGE* 05/07/2014 1838   Sepsis Labs: @LABRCNTIP (procalcitonin:4,lacticidven:4) )No results found for this or any previous visit (from the past 240 hour(s)).  UA ordered  No results found for: HGBA1C   Estimated Creatinine Clearance: 74.8 mL/min (by C-G formula based on Cr of 0.7).  BNP (last 3 results) No results for input(s): PROBNP in the last 8760 hours.   ECG REPORT  Independently reviewed Rate: 64   Rhythm: NSR ST&T Change: No acute ischemic changes   QTC 418  Filed Weights   10/01/15 2045  Weight: 78.472 kg (173 lb)     Cultures:    Component Value Date/Time   SDES BLOOD RIGHT ARM 05/07/2014 1910   SPECREQUEST BOTTLES DRAWN AEROBIC AND ANAEROBIC 5CC 05/07/2014 1910   CULT  05/07/2014 1910    NO GROWTH 5 DAYS Performed at [place] 05/14/2014 FINAL 05/07/2014 1910     Radiological Exams on Admission: Ct Head Code Stroke W/o Cm  10/01/2015  CLINICAL DATA:  Right-sided weakness. Left-sided facial droop. Onset 19:00. EXAM: CT HEAD WITHOUT CONTRAST TECHNIQUE: Contiguous axial images were obtained from the base of the skull through the vertex without intravenous contrast. 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 There is no intracranial hemorrhage or evidence of acute infarction. There is mild generalized atrophy. There is mild chronic microvascular ischemic change. There are remote lacunar infarctions in the left basal ganglia. There is no significant extra-axial fluid collection. No acute intracranial findings are evident. The calvarium and skullbase are intact. Visible paranasal sinuses and orbits are unremarkable. IMPRESSION: 1. Left cerebellopontine angle mass, very slowly enlarging from 2012 although there is little change from 05/07/2014. 2. Chronic white matter hypodensities consistent with chronic small vessel ischemic disease. Remote left basal ganglia infarctions. No acute findings. Electronically Signed   By: Andreas Newport M.D.   On: 10/01/2015 21:14    Chart has been  reviewed    Assessment/Plan   75 y.o. male with medical history significant of HTN, HL,   CAD sp MI,  recurrent nephrolithiasis, Lance Coon (uses wheelchair), brain tumor here with TIA been admitted to [facility],   Present on Admission:  . TIA (transient ischemic attack)  - will admit based on TIA/CVA protocol, await results of MRA/MRI, Carotid Doppler and Echo, obtain cardiac enzymes,  ECG,   Lipid panel, TSH. Order PT/OT evaluation. Will make sure patient is on antiplatelet agent.   Neurology consult.     . Essential (primary) hypertension -  will allow some permissive hypertension. For tonight and resume medications in the morning  . Hyperlipidemia - continue statin Chronic paraplegia secondary to [employer] syndrome - stable appears to be close to baseline at this point. Other plan as per orders.  DVT prophylaxis:  SCD      Code Status:  FULL CODE  as per patient    Family Communication:   Family not at  Bedside    Disposition Plan:   To home once workup is complete and patient is stable   Consults called: Neurology was made aware    Admission status:   obs    Level of care     tele      [place] 10/01/2015, 11:10 PM    [facility]  Pager [phone]   after 2 AM please page floor coverage PA If 7AM-7PM, please contact the day team taking care of the patient  [email or web address]  Password [email or web address]

## 2015-10-01 NOTE — ED Notes (Signed)
Pt alert and oriented x 4 no acute distress. Dr.Knopp at bedside to speak with pt about plan of care.

## 2015-10-01 NOTE — ED Notes (Signed)
Patient transported to CT 

## 2015-10-01 NOTE — ED Notes (Signed)
Pt alert and oriented x4 passed swallow screen without any difficulty. Family at bedside.

## 2015-10-01 NOTE — ED Notes (Signed)
Pt reports right sided weakness and left sided facial droop that began at appx 1900 tonight. PT reports sitting in chair at appx 1745 and then at 1900 was not able to get up. Pt reports history of Guillian-Barre syndrome.

## 2015-10-01 NOTE — ED Notes (Signed)
Bed: [id number] Expected date:  Expected time:  Means of arrival:  Comments: 73 M weakness

## 2015-10-01 NOTE — ED Notes (Signed)
Pt returned from CT scan via stretcher without incident

## 2015-10-02 ENCOUNTER — Observation Stay (HOSPITAL_COMMUNITY): Payer: Medicare Other

## 2015-10-02 ENCOUNTER — Observation Stay (HOSPITAL_BASED_OUTPATIENT_CLINIC_OR_DEPARTMENT_OTHER): Payer: Medicare Other

## 2015-10-02 DIAGNOSIS — E785 Hyperlipidemia, unspecified: Secondary | ICD-10-CM | POA: Diagnosis not present

## 2015-10-02 DIAGNOSIS — I6789 Other cerebrovascular disease: Secondary | ICD-10-CM

## 2015-10-02 DIAGNOSIS — G459 Transient cerebral ischemic attack, unspecified: Secondary | ICD-10-CM

## 2015-10-02 DIAGNOSIS — I10 Essential (primary) hypertension: Secondary | ICD-10-CM | POA: Diagnosis not present

## 2015-10-02 LAB — ECHOCARDIOGRAM COMPLETE
AVLVOTPG: 5 mmHg
CHL CUP MV DEC (S): 268
E decel time: 268 msec
EERAT: 6.21
FS: 26 % — AB (ref 28–44)
Height: 68 in
IVS/LV PW RATIO, ED: 1.52
LA ID, A-P, ES: 30 mm
LA diam end sys: 30 mm
LA diam index: 1.56 cm/m2
LA vol A4C: 19.3 ml
LDCA: 2.84 cm2
LV PW d: 11.1 mm — AB (ref 0.6–1.1)
LV TDI E'LATERAL: 12.8
LVEEAVG: 6.21
LVEEMED: 6.21
LVELAT: 12.8 cm/s
LVOT VTI: 25.6 cm
LVOT diameter: 19 mm
LVOTPV: 113 cm/s
LVOTSV: 73 mL
MV pk A vel: 96 m/s
MV pk E vel: 79.5 m/s
MVPG: 3 mmHg
RV TAPSE: 22.9 mm
TDI e' medial: 6.96
WEIGHTICAEL: 2768 [oz_av]

## 2015-10-02 LAB — LIPID PANEL
CHOL/HDL RATIO: 3.2 ratio
Cholesterol: 118 mg/dL (ref 0–200)
HDL: 37 mg/dL — ABNORMAL LOW (ref >40)
LDL CALC: 73 mg/dL (ref 0–99)
Triglycerides: 39 mg/dL (ref <150)
VLDL: 8 mg/dL (ref 0–40)

## 2015-10-02 LAB — TROPONIN I: Troponin I: <0.03 ng/mL (ref <0.03)

## 2015-10-02 MED ORDER — ACETAMINOPHEN 650 MG RE SUPP: 650.0000 mg | RECTAL | Status: DC | PRN

## 2015-10-02 MED ORDER — GABAPENTIN 300 MG PO CAPS
300.0000 mg | ORAL_CAPSULE | Freq: Three times a day (TID) | ORAL | Status: DC
  Administered 2015-10-02 – 2015-10-04 (×7): 300 mg via ORAL
  Filled 2015-10-02 (×7): qty 1

## 2015-10-02 MED ORDER — METOPROLOL TARTRATE 25 MG PO TABS
25.0000 mg | ORAL_TABLET | Freq: Every morning | ORAL | Status: DC
  Administered 2015-10-02 – 2015-10-04 (×3): 25 mg via ORAL
  Filled 2015-10-02 (×3): qty 1

## 2015-10-02 MED ORDER — AMLODIPINE BESYLATE 2.5 MG PO TABS
2.5000 mg | ORAL_TABLET | Freq: Every morning | ORAL | Status: DC
  Administered 2015-10-02 – 2015-10-04 (×3): 2.5 mg via ORAL
  Filled 2015-10-02 (×3): qty 1

## 2015-10-02 MED ORDER — PERFLUTREN LIPID MICROSPHERE
1.0000 mL | INTRAVENOUS | Status: AC | PRN
  Administered 2015-10-02: 2 mL via INTRAVENOUS
  Filled 2015-10-02: qty 10

## 2015-10-02 MED ORDER — CLOPIDOGREL BISULFATE 75 MG PO TABS
75.0000 mg | ORAL_TABLET | Freq: Every day | ORAL | Status: DC
  Administered 2015-10-03 – 2015-10-04 (×2): 75 mg via ORAL
  Filled 2015-10-02 (×3): qty 1

## 2015-10-02 MED ORDER — ASPIRIN 325 MG PO TABS
325.0000 mg | ORAL_TABLET | Freq: Every day | ORAL | Status: DC
  Administered 2015-10-02: 325 mg via ORAL
  Filled 2015-10-02: qty 1

## 2015-10-02 MED ORDER — ACETAMINOPHEN 325 MG PO TABS: 650.0000 mg | ORAL_TABLET | ORAL | Status: DC | PRN

## 2015-10-02 MED ORDER — PRAVASTATIN SODIUM 40 MG PO TABS
40.0000 mg | ORAL_TABLET | Freq: Every day | ORAL | Status: DC
  Administered 2015-10-02 – 2015-10-04 (×3): 40 mg via ORAL
  Filled 2015-10-02 (×3): qty 1

## 2015-10-02 MED ORDER — ASPIRIN 300 MG RE SUPP: 300.0000 mg | Freq: Every day | RECTAL | Status: DC

## 2015-10-02 MED ORDER — SODIUM CHLORIDE 0.9 % IV SOLN
INTRAVENOUS | Status: DC
  Administered 2015-10-02: 1000 mL via INTRAVENOUS

## 2015-10-02 MED ORDER — ESCITALOPRAM OXALATE 10 MG PO TABS
10.0000 mg | ORAL_TABLET | Freq: Every day | ORAL | Status: DC
  Administered 2015-10-02 – 2015-10-03 (×3): 10 mg via ORAL
  Filled 2015-10-02 (×3): qty 1

## 2015-10-02 MED ORDER — LISINOPRIL 2.5 MG PO TABS
2.5000 mg | ORAL_TABLET | Freq: Every morning | ORAL | Status: DC
  Administered 2015-10-02 – 2015-10-04 (×3): 2.5 mg via ORAL
  Filled 2015-10-02 (×3): qty 1

## 2015-10-02 MED ORDER — STROKE: EARLY STAGES OF RECOVERY BOOK: Freq: Once | Status: DC

## 2015-10-02 NOTE — Evaluation (Signed)
Physical Therapy Evaluation Patient Details Name: Theodore Mendoza MRN: [id number] DOB: 11/30/1940 Today's Date: 10/02/2015   History of Present Illness  HPI: Theodore Mendoza is an 75 y.o. male with a history of hypertension, hyperlipidemia, myocardial infarction and Guillain-Barr syndrome who experienced transient side weakness and slurred speech and was taken to [facility]. He has no previous history of stroke nor TIA. He's been taking aspirin 81 mg per day. He was last known well at 5:45 PM. Deficits were noted when he attempted to get out of his chair at 7:00 PM. Patient has residual severe weakness of both lower extremities as well as distal upper extremities from Guillain-Barr syndrome in 1987. He was not aware of any worsening of leg weakness. Deficits improved in route to the hospital and subsequently resolved. He has residual left facial weakness from GBS which is unchanged  Clinical Impression   Pt admitted with above diagnosis. Pt currently with functional limitations due to the deficits listed below (see PT Problem List).  Pt will benefit from skilled PT to increase their independence and safety with mobility to allow discharge to the venue listed below.       Follow Up Recommendations Home health PT;Supervision/Assistance - 24 hour (Does insurance cover [facility]?)    Equipment Recommendations  None recommended by PT (quite well-equipped)    Recommendations for Other Services       Precautions / Restrictions Restrictions Weight Bearing Restrictions: No RLE Weight Bearing: Partial weight bearing LLE Weight Bearing: Partial weight bearing      Mobility  Bed Mobility Overal bed mobility: Needs Assistance Bed Mobility: Supine to Sit     Supine to sit: Supervision     General bed mobility comments: Supervision for safety; used rails  Transfers Overall transfer level: Needs assistance Equipment used: None;Rolling walker (2 wheeled) Transfers: Stand Pivot  Transfers;Sit to/from Stand Sit to Stand: +2 safety/equipment;Mod assist Stand pivot transfers: Min guard       General transfer comment: Heavy mod assist to power up to stand to RW with AFOs on; significantly decr control of descent to sit; Performs basic pivot transfer with very close guard for safety; Reaches forward for far armrest first, pulls to semi stand, knees tend to lock back into hyperextension for satbility on feet; then turns almost 180 degress to sit in chair; no control of descent  Ambulation/Gait Ambulation/Gait assistance: +2 safety/equipment;Mod assist Ambulation Distance (Feet): 6 Feet Assistive device: Rolling walker (2 wheeled) Gait Pattern/deviations: Decreased step length - right;Decreased step length - left;Ataxic (Bil AFOs on)   Gait velocity interpretation: Below normal speed for age/gender General Gait Details: very unstable in walking and heavily dependent on RW; erratic step width; Reports he walked with crutches the day before admission (not far)  Stairs            Wheelchair Mobility    Modified Rankin (Stroke Patients Only) Modified Rankin (Stroke Patients Only) Pre-Morbid Rankin Score: Moderately severe disability Modified Rankin: Moderately severe disability     Balance Overall balance assessment: Needs assistance   Sitting balance-Leahy Scale: Fair       Standing balance-Leahy Scale: Zero                               Pertinent Vitals/Pain Pain Assessment: 0-10 Pain Score: 3  Pain Location: Headache, started resolving during session Pain Descriptors / Indicators: Headache Pain Intervention(s): Monitored during session    Home Living Family/patient  expects to be discharged to:: Private residence Living Arrangements: Spouse/significant other Available Help at Discharge: Family;Available 24 hours/day Type of Home: House Home Access: Ramped entrance     Home Layout: Two level;Able to live on main level with  bedroom/bathroom Home Equipment: Crutches;Wheelchair - manual;Wheelchair - power;Grab bars - toilet;Tub bench      Prior Function Level of Independence: Independent with assistive device(s)         Comments: Walks a little with cruches and bil leg braces. Typically uses w/c to get around. Completes all BADLs independently.     Hand Dominance        Extremity/Trunk Assessment   Upper Extremity Assessment: Defer to OT evaluation           Lower Extremity Assessment: RLE deficits/detail;LLE deficits/detail RLE Deficits / Details: Active hip felxion 3-/5, able to activate quads but difficulty against gravity; no active motion of ankles; able to passively dorsifelx to neutral LLE Deficits / Details: Active hip felxion 3-/5, able to activate quads but difficulty against gravity; no active motion of ankles; able to passively dorsifelx to neutral     Communication   Communication: No difficulties  Cognition Arousal/Alertness: Awake/alert Behavior During Therapy: WFL for tasks assessed/performed Overall Cognitive Status: Within Functional Limits for tasks assessed                      General Comments      Exercises        Assessment/Plan    PT Assessment Patient needs continued PT services  PT Diagnosis Difficulty walking;Generalized weakness   PT Problem List Decreased strength;Decreased range of motion;Decreased activity tolerance;Decreased balance;Decreased mobility;Decreased coordination;Decreased knowledge of use of DME;Decreased safety awareness;Impaired tone;Decreased knowledge of precautions  PT Treatment Interventions DME instruction;Gait training;Functional mobility training;Therapeutic activities;Balance training;Therapeutic exercise;Patient/family education   PT Goals (Current goals can be found in the Care Plan section) Acute Rehab PT Goals Patient Stated Goal: Wants to be home today PT Goal Formulation: With patient Time For Goal Achievement:  10/16/15 Potential to Achieve Goals: Good    Frequency Min 3X/week   Barriers to discharge Other (comment) Pt's wife could use some assistance in caring for him    Co-evaluation               End of Session Equipment Utilized During Treatment: Gait belt (and bil AFOs) Activity Tolerance: Patient tolerated treatment well Patient left: in chair;with call bell/phone within reach;with nursing/sitter in room Nurse Communication: Mobility status    Functional Assessment Tool Used: Clinical Judgement Functional Limitation: Mobility: Walking and moving around Mobility: Walking and Moving Around Current Status [id number]): At least 20 percent but less than 40 percent impaired, limited or restricted Mobility: Walking and Moving Around Goal Status [phone]): At least 1 percent but less than 20 percent impaired, limited or restricted    Time: 0757-0830 PT Time Calculation (min) (ACUTE ONLY): 33 min   Charges:   PT Evaluation $PT Eval Moderate Complexity: 1 Procedure     PT G Codes:   PT G-Codes **NOT FOR INPATIENT CLASS** Functional Assessment Tool Used: Clinical Judgement Functional Limitation: Mobility: Walking and moving around Mobility: Walking and Moving Around Current Status [id number]): At least 20 percent but less than 40 percent impaired, limited or restricted Mobility: Walking and Moving Around Goal Status [phone]): At least 1 percent but less than 20 percent impaired, limited or restricted    Roney Marion [facility] 10/02/2015, 10:05 AM  Roney Marion, [place] Pager [phone]  Office [phone]

## 2015-10-02 NOTE — Progress Notes (Signed)
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 Redmond Pulling, CAD status post MI presented to ED on 10/01/15 with transient right upper extremity weakness and slurred speech noted by patient and spouse at approximately 7 PM. No prior history of TIA nor stroke. Was on aspirin 81 MG daily. States that his symptoms resolved gradually in about 1 hour by the time he got to the ED. Admitted for evaluation of stroke/TIA.   Assessment & Plan:   Active Problems:   Essential (primary) hypertension   Hyperlipidemia   TIA (transient ischemic attack)   TIA - Resultant right upper extremity weakness and dysarthria-resolved. - CT head 7/1: Left CP angle slow growing mass, little change from 05/07/14, chronic small vessel ischemic disease. No acute findings. - MRI brain 10/02/15: No acute infarct. Enlarging left lateral posterior foci extra-axial mass consistent with meningioma. No associated vasogenic edema. - MRA brain: No major occlusion. - 2-D echo 10/02/15: LVEF 55%. Grade 1 diastolic dysfunction. - Carotid Dopplers: Pending - A1c: Pending - LDL: 73 - UDS negative, blood alcohol level <5. - PT recommends home health PT & Aide - On ASA prior to admission, changing to Plavix 75 MG daily for secondary stroke prophylaxis.  Meningioma - Slowly growing per repeat imaging. As discussed with neurology, continue outpatient follow-up with neurosurgery at [facility].  Essential hypertension - Allow for permissive hypertension. Continue lisinopril, metoprolol and amlodipine.  Hyperlipidemia - LDL 73, goal <70. Continue pravastatin 40 mg daily.  Chronic quadriparesis secondary to Ethelene Hal syndrome 1987  - (paraplegia worse than  upper extremity distal weakness) - Back to baseline per patient.   DVT prophylaxis: SCD's Code Status: Full Family Communication: Discussed with patient. No family at bedside. Disposition Plan: DC home pending completion of TIA workup, possibly 10/03/15.   Consultants:   Neurology  Procedures:   2-D echo 10/02/15: Study Conclusions  - Left ventricle: The cavity size was normal. There was moderate  focal basal hypertrophy of the septum. Systolic function was  normal. The estimated ejection fraction was in the range of 55%  to 60%. Wall motion was normal; there were no regional wall  motion abnormalities. Doppler parameters are consistent with  abnormal left ventricular relaxation (grade 1 diastolic  dysfunction).  Impressions:  - Definity used; normal LV systolic function; grade 1 diastolic  dysfunction.  Antimicrobials:   None    Subjective: States that he is back to his usual. Slurred speech has resolved and so has weakness of his right upper extremity. No new symptoms.  Objective:  Filed Vitals:   10/02/15 0646 10/02/15 0958 10/02/15 1000 10/02/15 1100  BP: 162/73 152/75  176/95  Pulse: 58  65 52  Temp: 98.5 F (36.9 C)  98.8 F (37.1 C) 99.3 F (37.4 C)  TempSrc: Oral  Oral Oral  Resp: 15  16 17   Height:      Weight:      SpO2: 100%  98% 98%    Intake/Output Summary (Last 24 hours) at 10/02/15 1257 Last data filed at 10/01/15 2321  Gross per 24 hour  Intake      0 ml  Output    120 ml  Net   -120 ml   Filed Weights   10/01/15 2045  Weight: 78.472 kg (173 lb)  Examination:  General exam: Pleasant elderly male sitting up in bed eating lunch this afternoon. Respiratory system: Clear to auscultation. Respiratory effort normal. Cardiovascular system: S1 & S2 heard, RRR. No JVD, murmurs, rubs, gallops or clicks. No pedal edema. Telemetry: SB in the 50s-SR. Gastrointestinal system: Abdomen is nondistended, soft and nontender. No  organomegaly or masses felt. Normal bowel sounds heard. Central nervous system: Alert and oriented. No cranial nerve deficits Extremities: Chronic quadriparesis. Grade 3 x 5 power in lower extremities with foot drop. Symmetrical distal upper extremity weakness with decreased grip. Skin: No rashes, lesions or ulcers Psychiatry: Judgement and insight appear normal. Mood & affect appropriate.     Data Reviewed: I have personally reviewed following labs and imaging studies  CBC:  Recent Labs Lab 10/01/15 2053 10/01/15 2101  WBC 8.3  --   NEUTROABS 4.9  --   HGB 13.3 13.6  HCT 39.5 40.0  MCV 91.6  --   PLT 206  --    Basic Metabolic Panel:  Recent Labs Lab 10/01/15 2053 10/01/15 2101  NA 140 141  K 4.0 4.3  CL 107 103  CO2 27  --   GLUCOSE 149* 148*  BUN 21* 24*  CREATININE 0.70 0.70  CALCIUM 8.9  --    GFR: Estimated Creatinine Clearance: 74.8 mL/min (by C-G formula based on Cr of 0.7). Liver Function Tests:  Recent Labs Lab 10/01/15 2053  AST 15  ALT 11*  ALKPHOS 64  BILITOT 0.8  PROT 6.4*  ALBUMIN 4.1   No results for input(s): LIPASE, AMYLASE in the last 168 hours. No results for input(s): AMMONIA in the last 168 hours. Coagulation Profile:  Recent Labs Lab 10/01/15 2053  INR 1.14   Cardiac Enzymes:  Recent Labs Lab 10/02/15 0723  TROPONINI <0.03   BNP (last 3 results) No results for input(s): PROBNP in the last 8760 hours. HbA1C: No results for input(s): HGBA1C in the last 72 hours. CBG: No results for input(s): GLUCAP in the last 168 hours. Lipid Profile:  Recent Labs  10/02/15 0723  CHOL 118  HDL 37*  LDLCALC 73  TRIG 39  CHOLHDL 3.2   Thyroid Function Tests: No results for input(s): TSH, T4TOTAL, FREET4, T3FREE, THYROIDAB in the last 72 hours. Anemia Panel: No results for input(s): VITAMINB12, FOLATE, FERRITIN, TIBC, IRON, RETICCTPCT in the last 72 hours.  Sepsis Labs: No results for input(s): PROCALCITON, LATICACIDVEN in  the last 168 hours.  No results found for this or any previous visit (from the past 240 hour(s)).       Radiology Studies: Dg Chest 2 View  10/02/2015  CLINICAL DATA:  TIA EXAM: CHEST  2 VIEW COMPARISON:  05/07/2014 FINDINGS: The heart size and mediastinal contours are within normal limits. Both lungs are clear. The visualized skeletal structures are unremarkable. IMPRESSION: No active cardiopulmonary disease. Electronically Signed   By: Inez Catalina M.D.   On: 10/02/2015 08:30   Mr Brain Wo Contrast  10/02/2015  CLINICAL DATA:  75 year old hypertensive male with hyperlipidemia, prior myocardial infarction and Guillain-Barre presenting with transient right-sided weakness and left facial droop. Subsequent encounter. EXAM: MRI HEAD WITHOUT CONTRAST MRA HEAD WITHOUT CONTRAST TECHNIQUE: Multiplanar, multiecho pulse sequences of the brain and surrounding structures were obtained without intravenous contrast. Angiographic images of the head were obtained using MRA technique without contrast. COMPARISON:  10/01/2015 and 05/07/2014 head CT. 05/28/2010 brain MR. FINDINGS: MRI HEAD FINDINGS No acute infarct. Small focal region of blood breakdown products posterior left subinsular region unchanged  from 2012 and may represent result of prior hemorrhagic ischemia versus small cavernoma. Moderate chronic microvascular changes. Enlarging extra-axial mass centered in the left lateral posterior fossa now measuring 2.8 x 3.4 x 3.4 cm versus 2.2 x 3.1 x 3 cm on 2012 exam. Characteristics of a meningioma with extension through or superior displacement of the left tentorium impressing upon the undersurface of the posterior temporal lobe. Compression of the left cerebellum. No associated vasogenic edema. Meningioma narrows the left transverse sinus/sigmoid sinus which may be partially occluded. MR venography or CT venography can be performed for further delineation if clinically desired. Dominant dural drainage is to the  right. Mild to moderate global atrophy without hydrocephalus. Post lens replacement without acute orbital abnormality. Mild to slightly moderate maxillary sinus mucosal thickening with polypoid opacification. Cervical medullary junction unremarkable. MRA HEAD FINDINGS Mild narrowing left internal carotid artery cavernous segment with adjacent mild ectasia. Mild irregularity without significant narrowing M1 segment middle cerebral artery bilaterally. Middle cerebral artery branch vessel mild narrowing and irregularity. Mild irregularity distal vertebral arteries and basilar artery without significant narrowing. Right posterior inferior cerebellar artery not visualized. Small anterior inferior cerebellar arteries. Mild to moderate narrowing portions of the superior cerebellar arteries and posterior cerebral arteries bilaterally. No aneurysm noted. IMPRESSION: MRI HEAD No acute infarct. Moderate chronic microvascular changes. Enlarging left lateral posterior fossa extra-axial mass consistent with meningioma now measuring 2.8 x 3.4 x 3.4 cm versus 2.2 x 3.1 x 3 cm on 2012 exam. Extension through or superior displacement of the left tentorium impressing upon the undersurface of the posterior temporal lobe. Compression of the left cerebellum. No associated vasogenic edema. Meningioma narrows the left transverse sinus/sigmoid sinus which may be partially occluded. MR venography or CT venography can be performed for further delineation if clinically desired. Dominant dural drainage is to the right. Mild to moderate global atrophy without hydrocephalus. Small focal region of blood breakdown products posterior left subinsular region unchanged from 2012 and may represent result of prior hemorrhagic ischemia versus small cavernoma. Mild to slightly moderate maxillary sinus mucosal thickening with polypoid opacification. MRA HEAD Mild narrowing left internal carotid artery cavernous segment with adjacent mild ectasia. Mild  irregularity without significant narrowing M1 segment middle cerebral artery bilaterally. Middle cerebral artery branch vessel mild narrowing and irregularity. Mild irregularity distal vertebral arteries and basilar artery without significant narrowing. Right posterior inferior cerebellar artery not visualized. Mild to moderate narrowing portions of the superior cerebellar arteries and posterior cerebral arteries bilaterally. Electronically Signed   By: Genia Del M.D.   On: 10/02/2015 07:37   Mr Mra Head/brain Wo Cm  10/02/2015  CLINICAL DATA:  75 year old hypertensive male with hyperlipidemia, prior myocardial infarction and Guillain-Barre presenting with transient right-sided weakness and left facial droop. Subsequent encounter. EXAM: MRI HEAD WITHOUT CONTRAST MRA HEAD WITHOUT CONTRAST TECHNIQUE: Multiplanar, multiecho pulse sequences of the brain and surrounding structures were obtained without intravenous contrast. Angiographic images of the head were obtained using MRA technique without contrast. COMPARISON:  10/01/2015 and 05/07/2014 head CT. 05/28/2010 brain MR. FINDINGS: MRI HEAD FINDINGS No acute infarct. Small focal region of blood breakdown products posterior left subinsular region unchanged from 2012 and may represent result of prior hemorrhagic ischemia versus small cavernoma. Moderate chronic microvascular changes. Enlarging extra-axial mass centered in the left lateral posterior fossa now measuring 2.8 x 3.4 x 3.4 cm versus 2.2 x 3.1 x 3 cm on 2012 exam. Characteristics of a meningioma with extension through or superior displacement of the left tentorium impressing upon  the undersurface of the posterior temporal lobe. Compression of the left cerebellum. No associated vasogenic edema. Meningioma narrows the left transverse sinus/sigmoid sinus which may be partially occluded. MR venography or CT venography can be performed for further delineation if clinically desired. Dominant dural drainage is  to the right. Mild to moderate global atrophy without hydrocephalus. Post lens replacement without acute orbital abnormality. Mild to slightly moderate maxillary sinus mucosal thickening with polypoid opacification. Cervical medullary junction unremarkable. MRA HEAD FINDINGS Mild narrowing left internal carotid artery cavernous segment with adjacent mild ectasia. Mild irregularity without significant narrowing M1 segment middle cerebral artery bilaterally. Middle cerebral artery branch vessel mild narrowing and irregularity. Mild irregularity distal vertebral arteries and basilar artery without significant narrowing. Right posterior inferior cerebellar artery not visualized. Small anterior inferior cerebellar arteries. Mild to moderate narrowing portions of the superior cerebellar arteries and posterior cerebral arteries bilaterally. No aneurysm noted. IMPRESSION: MRI HEAD No acute infarct. Moderate chronic microvascular changes. Enlarging left lateral posterior fossa extra-axial mass consistent with meningioma now measuring 2.8 x 3.4 x 3.4 cm versus 2.2 x 3.1 x 3 cm on 2012 exam. Extension through or superior displacement of the left tentorium impressing upon the undersurface of the posterior temporal lobe. Compression of the left cerebellum. No associated vasogenic edema. Meningioma narrows the left transverse sinus/sigmoid sinus which may be partially occluded. MR venography or CT venography can be performed for further delineation if clinically desired. Dominant dural drainage is to the right. Mild to moderate global atrophy without hydrocephalus. Small focal region of blood breakdown products posterior left subinsular region unchanged from 2012 and may represent result of prior hemorrhagic ischemia versus small cavernoma. Mild to slightly moderate maxillary sinus mucosal thickening with polypoid opacification. MRA HEAD Mild narrowing left internal carotid artery cavernous segment with adjacent mild ectasia.  Mild irregularity without significant narrowing M1 segment middle cerebral artery bilaterally. Middle cerebral artery branch vessel mild narrowing and irregularity. Mild irregularity distal vertebral arteries and basilar artery without significant narrowing. Right posterior inferior cerebellar artery not visualized. Mild to moderate narrowing portions of the superior cerebellar arteries and posterior cerebral arteries bilaterally. Electronically Signed   By: Genia Del M.D.   On: 10/02/2015 07:37   Ct Head Code Stroke W/o Cm  10/01/2015  CLINICAL DATA:  Right-sided weakness. Left-sided facial droop. Onset 19:00. EXAM: CT HEAD WITHOUT CONTRAST TECHNIQUE: Contiguous axial images were obtained from the base of the skull through the vertex without intravenous contrast. 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 There is no intracranial hemorrhage or evidence of acute infarction. There is mild generalized atrophy. There is mild chronic microvascular ischemic change. There are remote lacunar infarctions in the left basal ganglia. There is no significant extra-axial fluid collection. No acute intracranial findings are evident. The calvarium and skullbase are intact. Visible paranasal sinuses and orbits are unremarkable. IMPRESSION: 1. Left cerebellopontine angle mass, very slowly enlarging from 2012 although there is little change from 05/07/2014. 2. Chronic white matter hypodensities consistent with chronic small vessel ischemic disease. Remote left basal ganglia infarctions. No acute findings. Electronically Signed   By: Andreas Newport M.D.   On: 10/01/2015 21:14        Scheduled Meds: .  stroke: mapping our early stages of recovery book   Does not apply Once  . amLODipine  2.5 mg Oral q morning - 10a  .  aspirin  300 mg Rectal Daily   Or  .  aspirin  325 mg Oral Daily  . escitalopram  10 mg Oral QHS  . gabapentin  300 mg Oral TID  . lisinopril  2.5 mg Oral q morning - 10a  . metoprolol tartrate  25 mg Oral q morning - 10a  . pravastatin  40 mg Oral Q lunch   Continuous Infusions: . sodium chloride 1,000 mL (10/02/15 0208)        Time spent: 30 minutes    Karo Rog, MD [facility] Pager [phone]  If 7PM-7AM, please contact night-coverage [email or web address] Password [facility] 10/02/2015, 12:57 PM

## 2015-10-02 NOTE — Progress Notes (Signed)
Orthopedic Tech Progress Note Patient Details:  Theodore Mendoza 28-May-1940 [id number]  Ortho Devices Ortho Device/Splint Location:  Left Prafo boot Ortho Device/Splint Interventions: Application   [name] 10/02/2015, 2:14 PM

## 2015-10-02 NOTE — ED Notes (Signed)
[facility] notified of need for transport of pt to [facility].  

## 2015-10-02 NOTE — Progress Notes (Signed)
Patient arrived from [place] ED around 0100, alert, oriented, no pain initial stroke symptoms resolved but has chronic physical limitations from Guillain-Barre syndrome 1987. IV fluid and SCD's in place will continue q 2 neuro and vital sign checks, will end at 1300 10/02/15.

## 2015-10-02 NOTE — Consult Note (Signed)
Admission H&P    Chief Complaint: Transient right-sided weakness and slurred speech.  HPI: Theodore Mendoza is an 75 y.o. male with a history of hypertension, hyperlipidemia, myocardial infarction and Guillain-Barr syndrome who experienced transient side weakness and slurred speech and was taken to [facility]. He has no previous history of stroke nor TIA. He's been taking aspirin 81 mg per day. He was last known well at 5:45 PM. Deficits were noted when he attempted to get out of his chair at 7:00 PM. Patient has residual severe weakness of both lower extremities as well as distal upper extremities from Guillain-Barr syndrome in 1987. He was not aware of any worsening of leg weakness. Deficits improved in route to the hospital and subsequently resolved. He has residual left facial weakness from GBS which is unchanged.  LSN: 5:45 PM on 10/01/2015 tPA Given: No: Rapidly resolving deficits mRankin:  Past Medical History  Diagnosis Date  . Hypertension   . Myocardial infarction ([place]) 1997  . Guillain-Barre syndrome ([place]) Feb 13 1986    Past Surgical History  Procedure Laterality Date  . Cystoscopy with stent placement Left 05/08/2014    Procedure: CYSTOSCOPY, RETROGRADE PYELOGRAM WITH LEFT URETERAL STENT PLACEMENT;  Surgeon: Alexis Frock, MD;  Location: [facility];  Service: Urology;  Laterality: Left;  . Coronary angioplasty  1997    after mi  . Eye surgery Right may 2015    growth removed, july 2015left eye cataract removed, right eye catarct removed  . Gamma kniferadiation treatment  Feb 09 2014    [facility]  . Lithotripsy  years ago  . Cystoscopy with retrograde pyelogram, ureteroscopy and stent placement Left 06/28/2014    Procedure: 1ST STAGE CYSTOSCOPY/URETEROSCOPY/STENT PLACEMENT;  Surgeon: Alexis Frock, MD;  Location: [facility];  Service: Urology;  Laterality: Left;  . Holmium laser application Left 1/69/4503    Procedure: HOLMIUM LASER APPLICATION;  Surgeon:  Alexis Frock, MD;  Location: [facility];  Service: Urology;  Laterality: Left;  . Stone extraction with basket Left 06/28/2014    Procedure: STONE EXTRACTION WITH BASKET;  Surgeon: Alexis Frock, MD;  Location: [facility];  Service: Urology;  Laterality: Left;    Family History  Problem Relation Age of Onset  . Diabetes Mother   . Lung cancer Mother   . CAD Father   . Diabetes Brother   . Stroke Neg Hx    Social History:  reports that he has never smoked. He has never used smokeless tobacco. He reports that he does not drink alcohol or use illicit drugs.  Allergies:  Allergies  Allergen Reactions  . Hydrocodone-Acetaminophen Other (See Comments)    Confusion, weakness, hallucinations, mental status changes  . Morphine And Related Other (See Comments)    Confusion, weakness, hallucinations, mental status changes  . Valsartan Rash    Medications Prior to Admission  Medication Sig Dispense Refill  . amLODipine (NORVASC) 2.5 MG tablet Take 2.5 mg by mouth every morning.     [name] aspirin EC 81 MG tablet Take 81 mg by mouth every morning.     . escitalopram (LEXAPRO) 10 MG tablet Take 10 mg by mouth at bedtime.     . gabapentin (NEURONTIN) 300 MG capsule Take 300 mg by mouth 3 (three) times daily.    [name] lisinopril (PRINIVIL,ZESTRIL) 2.5 MG tablet Take 2.5 mg by mouth every morning.    . metoprolol tartrate (LOPRESSOR) 25 MG tablet Take 25 mg by mouth every morning.     [name]  naproxen sodium (ANAPROX) 220 MG tablet Take 220 mg by mouth 2 (two) times daily as needed (headache.).    [name] pravastatin (PRAVACHOL) 40 MG tablet Take 40 mg by mouth daily with lunch.      ROS: History obtained from the patient  General ROS: negative for - chills, fatigue, fever, night sweats, weight gain or weight loss Psychological ROS: negative for - behavioral disorder, hallucinations, memory difficulties, mood swings or suicidal ideation Ophthalmic ROS: negative for - blurry vision, double vision, eye pain or loss of  vision ENT ROS: negative for - epistaxis, nasal discharge, oral lesions, sore throat, tinnitus or vertigo Allergy and Immunology ROS: negative for - hives or itchy/watery eyes Hematological and Lymphatic ROS: negative for - bleeding problems, bruising or swollen lymph nodes Endocrine ROS: negative for - galactorrhea, hair pattern changes, polydipsia/polyuria or temperature intolerance Respiratory ROS: negative for - cough, hemoptysis, shortness of breath or wheezing Cardiovascular ROS: negative for - chest pain, dyspnea on exertion, edema or irregular heartbeat Gastrointestinal ROS: negative for - abdominal pain, diarrhea, hematemesis, nausea/vomiting or stool incontinence Genito-Urinary ROS: negative for - dysuria, hematuria, incontinence or urinary frequency/urgency Musculoskeletal ROS: Severe weakness of lower extremities, able to ambulate minimally with crutches Neurological ROS: as noted in HPI Dermatological ROS: negative for rash and skin lesion changes  Physical Examination: Blood pressure 157/72, pulse 68, temperature 97.8 F (36.6 C), temperature source Oral, resp. rate 17, height 5' 8"  (1.727 m), weight 78.472 kg (173 lb), SpO2 98 %.  HEENT-  Normocephalic, no lesions, without obvious abnormality.  Normal external eye and conjunctiva.  Normal TM's bilaterally.  Normal auditory canals and external ears. Normal external nose, mucus membranes and septum.  Normal pharynx. Neck supple with no masses, nodes, nodules or enlargement. Cardiovascular - regular rate and rhythm, S1, S2 normal, no murmur, click, rub or gallop Lungs - chest clear, no wheezing, rales, normal symmetric air entry Abdomen - soft, non-tender; bowel sounds normal; no masses,  no organomegaly Extremities - no joint deformities, effusion, or inflammation, moderate edema of feet  Neurologic Examination: Mental Status: Alert, oriented, thought content appropriate.  Speech fluent without evidence of aphasia. Able to  follow commands without difficulty. Cranial Nerves: II-Visual fields were normal. III/IV/VI-Pupils were equal and reacted normally to light. Extraocular movements were full and conjugate.    V/VII-no facial numbness; mild left facial weakness, lower greater than upper facial weakness. VIII-normal. X-normal speech and symmetrical palatal movement. XI: trapezius strength/neck flexion strength normal bilaterally XII-midline tongue extension with normal strength. Motor: Normal strength proximally of upper extremities; atrophy and weakness of intrinsic hand muscles bilaterally; moderately severe weakness proximally of lower extremities and no intact voluntary movement of feet. Sensory: Normal throughout. Deep Tendon Reflexes: Absent throughout. Plantars: Mute bilaterally Cerebellar: Slightly impaired coordination of upper extremities, commensurate with severe weakness and atrophy of and she can muscles. Carotid auscultation: Normal  Results for orders placed or performed during the hospital encounter of 10/01/15 (from the past 48 hour(s))  Ethanol     Status: None   Collection Time: 10/01/15  8:53 PM  Result Value Ref Range   Alcohol, Ethyl (B) <5 <5 mg/dL    Comment:        LOWEST DETECTABLE LIMIT FOR SERUM ALCOHOL IS 5 mg/dL FOR MEDICAL PURPOSES ONLY   Protime-INR     Status: None   Collection Time: 10/01/15  8:53 PM  Result Value Ref Range   Prothrombin Time 14.3 11.6 - 15.2 seconds   INR 1.14 0.00 -  1.49  APTT     Status: None   Collection Time: 10/01/15  8:53 PM  Result Value Ref Range   aPTT 30 24 - 37 seconds  CBC     Status: None   Collection Time: 10/01/15  8:53 PM  Result Value Ref Range   WBC 8.3 4.0 - 10.5 K/uL   RBC 4.31 4.22 - 5.81 MIL/uL   Hemoglobin 13.3 13.0 - 17.0 g/dL   HCT 39.5 39.0 - 52.0 %   MCV 91.6 78.0 - 100.0 fL   MCH 30.9 26.0 - 34.0 pg   MCHC 33.7 30.0 - 36.0 g/dL   RDW 14.3 11.5 - 15.5 %   Platelets 206 150 - 400 K/uL  Differential     Status:  None   Collection Time: 10/01/15  8:53 PM  Result Value Ref Range   Neutrophils Relative % 59 %   Neutro Abs 4.9 1.7 - 7.7 K/uL   Lymphocytes Relative 27 %   Lymphs Abs 2.2 0.7 - 4.0 K/uL   Monocytes Relative 6 %   Monocytes Absolute 0.5 0.1 - 1.0 K/uL   Eosinophils Relative 7 %   Eosinophils Absolute 0.6 0.0 - 0.7 K/uL   Basophils Relative 1 %   Basophils Absolute 0.1 0.0 - 0.1 K/uL  Comprehensive metabolic panel     Status: Abnormal   Collection Time: 10/01/15  8:53 PM  Result Value Ref Range   Sodium 140 135 - 145 mmol/L   Potassium 4.0 3.5 - 5.1 mmol/L   Chloride 107 101 - 111 mmol/L   CO2 27 22 - 32 mmol/L   Glucose, Bld 149 (H) 65 - 99 mg/dL   BUN 21 (H) 6 - 20 mg/dL   Creatinine, Ser 0.70 0.61 - 1.24 mg/dL   Calcium 8.9 8.9 - 10.3 mg/dL   Total Protein 6.4 (L) 6.5 - 8.1 g/dL   Albumin 4.1 3.5 - 5.0 g/dL   AST 15 15 - 41 U/L   ALT 11 (L) 17 - 63 U/L   Alkaline Phosphatase 64 38 - 126 U/L   Total Bilirubin 0.8 0.3 - 1.2 mg/dL   GFR calc non Af Amer >60 >60 mL/min   GFR calc Af Amer >60 >60 mL/min    Comment: (NOTE) The eGFR has been calculated using the CKD EPI equation. This calculation has not been validated in all clinical situations. eGFR's persistently <60 mL/min signify possible Chronic Kidney Disease.    Anion gap 6 5 - 15  I-stat troponin, ED (not at [facility], [facility])     Status: None   Collection Time: 10/01/15  8:59 PM  Result Value Ref Range   Troponin i, poc 0.01 0.00 - 0.08 ng/mL   Comment 3            Comment: Due to the release kinetics of cTnI, a negative result within the first hours of the onset of symptoms does not rule out myocardial infarction with certainty. If myocardial infarction is still suspected, repeat the test at appropriate intervals.   I-Stat Chem 8, ED  (not at [facility], [facility])     Status: Abnormal   Collection Time: 10/01/15  9:01 PM  Result Value Ref Range   Sodium 141 135 - 145 mmol/L   Potassium 4.3 3.5 - 5.1 mmol/L   Chloride 103  101 - 111 mmol/L   BUN 24 (H) 6 - 20 mg/dL   Creatinine, Ser 0.70 0.61 - 1.24 mg/dL   Glucose, Bld 148 (H) 65 -  99 mg/dL   Calcium, Ion 1.20 1.12 - 1.23 mmol/L   TCO2 32 0 - 100 mmol/L   Hemoglobin 13.6 13.0 - 17.0 g/dL   HCT 40.0 39.0 - 52.0 %  Urine rapid drug screen (hosp performed)not at [facility]     Status: None   Collection Time: 10/01/15 11:15 PM  Result Value Ref Range   Opiates NONE DETECTED NONE DETECTED   Cocaine NONE DETECTED NONE DETECTED   Benzodiazepines NONE DETECTED NONE DETECTED   Amphetamines NONE DETECTED NONE DETECTED   Tetrahydrocannabinol NONE DETECTED NONE DETECTED   Barbiturates NONE DETECTED NONE DETECTED    Comment:        DRUG SCREEN FOR MEDICAL PURPOSES ONLY.  IF CONFIRMATION IS NEEDED FOR ANY PURPOSE, NOTIFY LAB WITHIN 5 DAYS.        LOWEST DETECTABLE LIMITS FOR URINE DRUG SCREEN Drug Class       Cutoff (ng/mL) Amphetamine      1000 Barbiturate      200 Benzodiazepine   086 Tricyclics       578 Opiates          300 Cocaine          300 THC              50   Urinalysis, Routine w reflex microscopic (not at [facility])     Status: None   Collection Time: 10/01/15 11:15 PM  Result Value Ref Range   Color, Urine YELLOW YELLOW   APPearance CLEAR CLEAR   Specific Gravity, Urine 1.019 1.005 - 1.030   pH 6.5 5.0 - 8.0   Glucose, UA NEGATIVE NEGATIVE mg/dL   Hgb urine dipstick NEGATIVE NEGATIVE   Bilirubin Urine NEGATIVE NEGATIVE   Ketones, ur NEGATIVE NEGATIVE mg/dL   Protein, ur NEGATIVE NEGATIVE mg/dL   Nitrite NEGATIVE NEGATIVE   Leukocytes, UA NEGATIVE NEGATIVE    Comment: MICROSCOPIC NOT DONE ON URINES WITH NEGATIVE PROTEIN, BLOOD, LEUKOCYTES, NITRITE, OR GLUCOSE <1000 mg/dL.   Ct Head Code Stroke W/o Cm  10/01/2015  CLINICAL DATA:  Right-sided weakness. Left-sided facial droop. Onset 19:00. EXAM: CT HEAD WITHOUT CONTRAST TECHNIQUE: Contiguous axial images were obtained from the base of the skull through the vertex without intravenous contrast.  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 There is no intracranial hemorrhage or evidence of acute infarction. There is mild generalized atrophy. There is mild chronic microvascular ischemic change. There are remote lacunar infarctions in the left basal ganglia. There is no significant extra-axial fluid collection. No acute intracranial findings are evident. The calvarium and skullbase are intact. Visible paranasal sinuses and orbits are unremarkable. IMPRESSION: 1. Left cerebellopontine angle mass, very slowly enlarging from 2012 although there is little change from 05/07/2014. 2. Chronic white matter hypodensities consistent with chronic small vessel ischemic disease. Remote left basal ganglia infarctions. No acute findings. Electronically Signed   By: Andreas Newport M.D.   On: 10/01/2015 21:14    Assessment: 75 y.o. male with multiple risk factors for stroke presenting with normal wall left subcortical MCA territory TIA. Small vessel ischemic stroke cannot be ruled out at this point, however.  Stroke Risk Factors - hyperlipidemia and hypertension  Plan: 1. HgbA1c, fasting lipid panel 2. MRI, MRA  of the brain without contrast 3. PT consult, OT consult 4. Echocardiogram 5. Carotid dopplers 6. Prophylactic therapy-Antiplatelet med: Aspirin  7. Risk factor modification 8. Telemetry monitoring  C.R. Nicole Kindred, MD  [facility] [phone]  10/02/2015, 1:13 AM

## 2015-10-02 NOTE — Progress Notes (Signed)
Placed order with ortho tech for prafo boot

## 2015-10-02 NOTE — Evaluation (Signed)
Occupational Therapy Evaluation Patient Details Name: Theodore Mendoza MRN: [id number] DOB: 1940/04/30 Today's Date: 10/02/2015    History of Present Illness HPI: Theodore Mendoza is an 75 y.o. male with a history of hypertension, hyperlipidemia, myocardial infarction and Guillain-Barr syndrome who experienced transient side weakness and slurred speech and was taken to [facility]. He has no previous history of stroke nor TIA. He's been taking aspirin 81 mg per day. He was last known well at 5:45 PM. Deficits were noted when he attempted to get out of his chair at 7:00 PM. Patient has residual severe weakness of both lower extremities as well as distal upper extremities from Guillain-Barr syndrome in 1987. He was not aware of any worsening of leg weakness. Deficits improved in route to the hospital and subsequently resolved. He has residual left facial weakness from GBS which is unchanged   Clinical Impression   Pt reports he was managing BADLs independently PTA. Currently pt is overall mod assist for LB ADLs and min guard for UB ADLs and basic transfers. Recommending [place] [facility] [place] aide for follow up in order to maximize pts independence and safety with ADLs and functional mobility upon return home. Pt would benefit from continued skilled OT to address established goals.    Follow Up Recommendations  Home health OT;Supervision/Assistance - 24 hour;Other (comment) ([place] aide)    Equipment Recommendations  None recommended by OT    Recommendations for Other Services       Precautions / Restrictions Precautions Precautions: None Restrictions Weight Bearing Restrictions: No RLE Weight Bearing: Partial weight bearing LLE Weight Bearing: Partial weight bearing      Mobility Bed Mobility Overal bed mobility: Needs Assistance Bed Mobility: Supine to Sit     Supine to sit: Supervision     General bed mobility comments: Supervision for safety; used rails  Transfers Overall transfer  level: Needs assistance Equipment used: None;Rolling walker (2 wheeled) Transfers: Stand Pivot Transfers;Sit to/from Stand Sit to Stand: +2 safety/equipment;Mod assist Stand pivot transfers: Min guard       General transfer comment: Heavy mod assist to power up to stand to RW with AFOs on; significantly decr control of descent to sit; Performs basic pivot transfer with very close guard for safety; Reaches forward for far armrest first, pulls to semi stand, knees tend to lock back into hyperextension for satbility on feet; then turns almost 180 degress to sit in chair; no control of descent    Balance Overall balance assessment: Needs assistance Sitting-balance support: Feet supported;No upper extremity supported Sitting balance-Leahy Scale: Fair     Standing balance support: Bilateral upper extremity supported Standing balance-Leahy Scale: Zero                              ADL Overall ADL's : Needs assistance/impaired Eating/Feeding: Independent;Sitting   Grooming: Set up;Supervision/safety;Sitting   Upper Body Bathing: Set up;Supervision/ safety;Sitting   Lower Body Bathing: Minimal assistance;Sitting/lateral leans   Upper Body Dressing : Set up;Supervision/safety;Sitting   Lower Body Dressing: Moderate assistance Lower Body Dressing Details (indicate cue type and reason): Mod assist to doff/don socks and pt able to assist with donning shoes and LE braces. Toilet Transfer: Occupational hygienist Details (indicate cue type and reason): Simulated by stand pivot from EOB to chair Toileting- Clothing Manipulation and Hygiene: Min guard;Sitting/lateral lean       Functional mobility during ADLs: Moderate assistance;+2 for safety/equipment General ADL Comments: Pt  reports he was independent with all BADLs PTA; discussed possible need for therapy follow up. Pt feels he does not need it but is agreeable. Discussed how he requires assist now and it may  be helpful for follow up therapy to regain full independence.     Vision Vision Assessment?: No apparent visual deficits   Perception     Praxis      Pertinent Vitals/Pain Pain Assessment: 0-10 Pain Score: 3  Pain Location: Headache Pain Descriptors / Indicators: Headache Pain Intervention(s): Monitored during session;Repositioned     Hand Dominance Right   Extremity/Trunk Assessment Upper Extremity Assessment Upper Extremity Assessment: RUE deficits/detail;LUE deficits/detail RUE Deficits / Details: Hx of bil hand contractures but functional. Reports sensation and ROM deficts have completely resolved RUE Coordination: decreased fine motor LUE Deficits / Details: Hx of bil hand contractures but functional. LUE Coordination: decreased fine motor   Lower Extremity Assessment Lower Extremity Assessment: Defer to PT evaluation RLE Deficits / Details: Active hip felxion 3-/5, able to activate quads but difficulty against gravity; no active motion of ankles; able to passively dorsifelx to neutral RLE Coordination: decreased fine motor LLE Deficits / Details: Active hip felxion 3-/5, able to activate quads but difficulty against gravity; no active motion of ankles; able to passively dorsifelx to neutral LLE Coordination: decreased gross motor;decreased fine motor   Cervical / Trunk Assessment Cervical / Trunk Assessment: Kyphotic   Communication Communication Communication: No difficulties   Cognition Arousal/Alertness: Awake/alert Behavior During Therapy: WFL for tasks assessed/performed Overall Cognitive Status: Within Functional Limits for tasks assessed                     General Comments       Exercises       Shoulder Instructions      Home Living Family/patient expects to be discharged to:: Private residence Living Arrangements: Spouse/significant other Available Help at Discharge: Family;Available 24 hours/day Type of Home: House Home Access: Ramped  entrance     Home Layout: Two level;Able to live on main level with bedroom/bathroom     Bathroom Shower/Tub: Tub/shower unit Shower/tub characteristics: Architectural technologist: Handicapped height Bathroom Accessibility: Yes How Accessible: Accessible via wheelchair Home Equipment: [place];Wheelchair - manual;Wheelchair - power;Grab bars - toilet;Tub bench          Prior Functioning/Environment Level of Independence: Independent with assistive device(s)        Comments: Walks a little with cruches and bil leg braces. Typically uses w/c to get around. Completes all BADLs independently.    OT Diagnosis: Generalized weakness;Acute pain   OT Problem List: Decreased strength;Decreased activity tolerance;Impaired balance (sitting and/or standing);Decreased coordination;Decreased safety awareness;Decreased knowledge of use of DME or AE;Decreased knowledge of precautions;Impaired UE functional use;Pain   OT Treatment/Interventions: Self-care/ADL training;Energy conservation;DME and/or AE instruction;Therapeutic activities;Patient/family education;Balance training    OT Goals(Current goals can be found in the care plan section) Acute Rehab OT Goals Patient Stated Goal: Wants to be home today OT Goal Formulation: With patient/family Time For Goal Achievement: 10/16/15 Potential to Achieve Goals: Good ADL Goals Pt Will Perform Lower Body Bathing: with modified independence;sitting/lateral leans Pt Will Perform Lower Body Dressing: with modified independence;sitting/lateral leans Pt Will Transfer to Toilet: with modified independence;stand pivot transfer;bedside commode Pt Will Perform Toileting - Clothing Manipulation and hygiene: with modified independence;sitting/lateral leans Pt Will Perform Tub/Shower Transfer: Tub transfer;with modified independence;Stand pivot transfer;tub bench  OT Frequency: Min 2X/week   Barriers to D/C:  Co-evaluation PT/OT/SLP  Co-Evaluation/Treatment: Yes Reason for Co-Treatment: For patient/therapist safety PT goals addressed during session: Mobility/safety with mobility OT goals addressed during session: ADL's and self-care;Other (comment) (functional mobility)      End of Session Equipment Utilized During Treatment: Gait belt;Rolling walker;Other (comment) (bil LE braces) Nurse Communication: Mobility status  Activity Tolerance: Patient tolerated treatment well Patient left: in chair;with call bell/phone within reach;with chair alarm set;with family/visitor present   Time: [id number] OT Time Calculation (min): 32 min Charges:  OT General Charges $OT Visit: 1 Procedure OT Evaluation $OT Eval Moderate Complexity: 1 Procedure G-Codes: OT G-codes **NOT FOR INPATIENT CLASS** Functional Assessment Tool Used: Clinical judgement Functional Limitation: Self care Self Care Current Status [id number]): At least 20 percent but less than 40 percent impaired, limited or restricted Self Care Goal Status [id number]): At least 1 percent but less than 20 percent impaired, limited or restricted   Binnie Kand M.S., OTR/L Pager: [phone]  10/02/2015, 10:32 AM

## 2015-10-02 NOTE — Progress Notes (Signed)
STROKE TEAM PROGRESS NOTE   HISTORY OF PRESENT ILLNESS (per record) Theodore Mendoza is an 75 y.o. male with a history of hypertension, hyperlipidemia, myocardial infarction and Guillain-Barr syndrome in 1987 who experienced transient side weakness and slurred speech and was taken to [facility]. He has no previous history of stroke nor TIA. He's been taking aspirin 81 mg per day. He was last known well at 5:45 PM. Deficits were noted when he attempted to get out of his chair at 7:00 PM. Patient has residual severe weakness of both lower extremities as well as distal upper extremities from Guillain-Barr syndrome in 1987. He was not aware of any worsening of leg weakness. Deficits improved in route to the hospital and subsequently resolved. He has residual left facial weakness from GBS which is unchanged.  LSN: 5:45 PM on 10/01/2015 tPA Given: No: Rapidly resolving deficits mRankin:   SUBJECTIVE (INTERVAL HISTORY) The patient's wife and niece were at the bedside. The patient reported continued right arm weakness and his speech was still "a little off".  Dr. Leonie Man discussed the planned workup as well as stroke risk factors. He recommended further follow-up with the patient's neurosurgeon at [facility] as outpatient for his enlarging meningioma.   OBJECTIVE Temp:  [97.8 F (36.6 C)-99.3 F (37.4 C)] 98.2 F (36.8 C) (07/02 1300) Pulse Rate:  [51-80] 51 (07/02 1300) Cardiac Rhythm:  [-] Normal sinus rhythm (07/02 0700) Resp:  [13-27] 16 (07/02 1300) BP: (147-176)/(67-99) 160/75 mmHg (07/02 1300) SpO2:  [95 %-100 %] 97 % (07/02 1300) Weight:  [78.472 kg (173 lb)] 78.472 kg (173 lb) (07/01 2045)  CBC:   Recent Labs Lab 10/01/15 2053 10/01/15 2101  WBC 8.3  --   NEUTROABS 4.9  --   HGB 13.3 13.6  HCT 39.5 40.0  MCV 91.6  --   PLT 206  --     Basic Metabolic Panel:   Recent Labs Lab 10/01/15 2053 10/01/15 2101  NA 140 141  K 4.0 4.3  CL 107 103  CO2 27  --   GLUCOSE  149* 148*  BUN 21* 24*  CREATININE 0.70 0.70  CALCIUM 8.9  --     Lipid Panel:     Component Value Date/Time   CHOL 118 10/02/2015 0723   TRIG 39 10/02/2015 0723   HDL 37* 10/02/2015 0723   CHOLHDL 3.2 10/02/2015 0723   VLDL 8 10/02/2015 0723   LDLCALC 73 10/02/2015 0723   HgbA1c: No results found for: HGBA1C Urine Drug Screen:     Component Value Date/Time   LABOPIA NONE DETECTED 10/01/2015 2315   COCAINSCRNUR NONE DETECTED 10/01/2015 2315   LABBENZ NONE DETECTED 10/01/2015 2315   AMPHETMU NONE DETECTED 10/01/2015 2315   THCU NONE DETECTED 10/01/2015 2315   LABBARB NONE DETECTED 10/01/2015 2315      IMAGING  Dg Chest 2 View 10/02/2015   No active cardiopulmonary disease.    Mr Jodene Nam Head/brain Wo Cm 10/02/2015    MRI HEAD  No acute infarct. Moderate chronic microvascular changes. Enlarging left lateral posterior fossa extra-axial mass consistent with meningioma now measuring 2.8 x 3.4 x 3.4 cm versus 2.2 x 3.1 x 3 cm on 2012 exam. Extension through or superior displacement of the left tentorium impressing upon the undersurface of the posterior temporal lobe. Compression of the left cerebellum. No associated vasogenic edema. Meningioma narrows the left transverse sinus/sigmoid sinus which may be partially occluded. MR venography or CT venography can be performed for further delineation if clinically  desired. Dominant dural drainage is to the right. Mild to moderate global atrophy without hydrocephalus. Small focal region of blood breakdown products posterior left subinsular region unchanged from 2012 and may represent result of prior hemorrhagic ischemia versus small cavernoma. Mild to slightly moderate maxillary sinus mucosal thickening with polypoid opacification.    MRA HEAD  Mild narrowing left internal carotid artery cavernous segment with adjacent mild ectasia. Mild irregularity without significant narrowing M1 segment middle cerebral artery bilaterally. Middle cerebral  artery branch vessel mild narrowing and irregularity. Mild irregularity distal vertebral arteries and basilar artery without significant narrowing. Right posterior inferior cerebellar artery not visualized. Mild to moderate narrowing portions of the superior cerebellar arteries and posterior cerebral arteries bilaterally.      Ct Head Code Stroke W/o Cm 10/01/2015   Left cerebellopontine angle mass, very slowly enlarging from 2012 although there is little change from 05/07/2014.  Chronic white matter hypodensities consistent with chronic small vessel ischemic disease.  Remote left basal ganglia infarctions.  No acute findings.   Carotid ultrasound - pending  Transthoracic echocardiogram 10/02/2015 Study Conclusions - Left ventricle: The cavity size was normal. There was moderate  focal basal hypertrophy of the septum. Systolic function was  normal. The estimated ejection fraction was in the range of 55%  to 60%. Wall motion was normal; there were no regional wall  motion abnormalities. Doppler parameters are consistent with  abnormal left ventricular relaxation (grade 1 diastolic  dysfunction). Impressions: - Definity used; normal LV systolic function; grade 1 diastolic  dysfunction.   PHYSICAL EXAM Frail elderly caucasian male not in distress. . Afebrile. Head is nontraumatic. Neck is supple without bruit.    Cardiac exam no murmur or gallop. Lungs are clear to auscultation. Distal pulses are well felt.bilateral hand wasting and flexion contractures with bilateral foot braces.  Neurological Exam :  Mental Status: Alert, oriented, thought content appropriate. Speech fluent without evidence of aphasia. Able to follow commands without difficulty. Cranial Nerves: II-Visual fields were normal. III/IV/VI-Pupils were equal and reacted normally to light. Extraocular movements were full and conjugate.  V/VII-no facial numbness; mild left facial weakness, lower greater than upper  facial weakness. VIII-normal. X-normal speech and symmetrical palatal movement. XI: trapezius strength/neck flexion strength normal bilaterally XII-midline tongue extension with normal strength. Motor: Normal strength proximally of upper extremities; atrophy and weakness of intrinsic hand muscles bilaterally; moderately severe weakness proximally of lower extremities and no intact voluntary movement of feet. Sensory: Normal throughout. B/l ankle braces severe wasting intrinsic hand and foot muscles with flexion contractures of distal fingers bilaterally Deep Tendon Reflexes: Absent throughout. Plantars: Mute bilaterally Cerebellar: Slightly impaired coordination of upper extremities, commensurate with severe weakness and atrophy of  Muscles. Gait : deferred   ASSESSMENT/PLAN Mr. Theodore Mendoza is a 75 y.o. male with history of hypertension, hyperlipidemia, coronary artery disease with previous MI, Ethelene Hal syndrome, meningioma, and previous stroke by head CT presenting with speech difficulties and right-sided weakness.[name] He did not receive IV t-PA due to improving deficits.   TIA:  Dominant - small vessel disease  .  Resultant - improvement in deficits.  MRI  - no acute infarct. Enlarging meningioma as described above.  MRA - right posterior inferior cerebellar artery not visualized  CT - Remote left basal ganglia infarctions.   Carotid Doppler - pending  2D Echo - EF 55-60%. No cardiac source of emboli identified.  LDL 73  HgbA1c pending  VTE prophylaxis - SCDs Diet Heart Room service appropriate?: Yes; Fluid  consistency:: Thin  aspirin 81 mg daily prior to admission, now on clopidogrel 75 mg daily  Patient counseled to be compliant with his antithrombotic medications  Ongoing aggressive stroke risk factor management  Therapy recommendations: Home health physical therapy recommended.  Disposition:  Pending   Hypertension  Blood pressures running somewhat  high  Permissive hypertension (OK if < 220/120) but gradually normalize in 5-7 days  Long-term BP goal normotensive   Hyperlipidemia  Home meds:  Pravachol 40 mg daily resumed in hospital  LDL 73, goal < 70  Continue statin at discharge    Other Stroke Risk Factors  Advanced age  Hx stroke/TIA  Coronary artery disease   Other Active Problems  Mildly elevated BUN - 24  Elevated glucose (148) - no history of diabetes - hemoglobin A1c pending  Left CP angle meningioma being followed at [place] aspirin to Plavix per Dr. Leonie Man  Follow-up with Dr. Leonie Man in 2 months.  The patient is to follow-up with his neurosurgeon regarding the enlarging meningioma.  Hospital day #   Mikey Bussing PA-C [facility] Pager [phone] 10/02/2015, 2:44 PM  I have personally examined this patient, reviewed notes, independently viewed imaging studies, participated in medical decision making and plan of care. I have made any additions or clarifications directly to the above note. Agree with note above.He  presented with transient dysarthria and right sided weakness  From left hemispheric TIA likely from small vessel disease  and remains at risk for neurological worsening, recurrent stroke/TIA and needs ongoing stroke evaluation and aggressive risk factor modification.Recommend change aspirin to Plavix . Greater than 50% time during this 35 minute visit were spent on counselling and coordination of care about stroke risk, prevention and treatment. D/W patient, wife and Dr Algis Liming.Outpatient f/u at [facility] for left CP angle meningioma  Antony Contras, MD Medical Director [facility] Pager: [phone] 10/02/2015 3:48 PM    To contact Stroke Continuity provider, please refer to [email or web address] After hours, contact General Neurology

## 2015-10-02 NOTE — Progress Notes (Signed)
Orthopedic Tech Progress Note Patient Details:  Theodore Mendoza January 04, 1941 [id number]  Ortho Devices Ortho Device/Splint Location:  Left Prafo boot Ortho Device/Splint Interventions: Application   [name] 10/02/2015, 2:13 PM

## 2015-10-02 NOTE — Progress Notes (Signed)
  Echocardiogram 2D Echocardiogram with Definity has been performed.  Darlina Sicilian M 10/02/2015, 11:50 AM

## 2015-10-03 ENCOUNTER — Observation Stay (HOSPITAL_COMMUNITY)
Admit: 2015-10-03 | Discharge: 2015-10-03 | Disposition: A | Discharge status: Home / Self Care | Payer: Medicare Other | Attending: Neurology | Admitting: Neurology

## 2015-10-03 ENCOUNTER — Observation Stay (HOSPITAL_BASED_OUTPATIENT_CLINIC_OR_DEPARTMENT_OTHER): Payer: Medicare Other

## 2015-10-03 DIAGNOSIS — E785 Hyperlipidemia, unspecified: Secondary | ICD-10-CM | POA: Diagnosis not present

## 2015-10-03 DIAGNOSIS — G459 Transient cerebral ischemic attack, unspecified: Secondary | ICD-10-CM | POA: Diagnosis not present

## 2015-10-03 DIAGNOSIS — I10 Essential (primary) hypertension: Secondary | ICD-10-CM | POA: Diagnosis not present

## 2015-10-03 LAB — GLUCOSE, CAPILLARY: GLUCOSE-CAPILLARY: 110 mg/dL — AB (ref 65–99)

## 2015-10-03 LAB — HEMOGLOBIN A1C
Hgb A1c MFr Bld: 5.3 % (ref 4.8–5.6)
Mean Plasma Glucose: 105 mg/dL

## 2015-10-03 MED ORDER — SODIUM CHLORIDE 0.9 % IV SOLN: Freq: Once | INTRAVENOUS | Status: DC

## 2015-10-03 NOTE — Progress Notes (Signed)
ELECTROENCEPHALOGRAM REPORT  Patient: Theodore Mendoza         Age: 75 y.o.        Sex: male Referring Physician: Erlinda Hong Report Date:  10/03/2015        Interpreting Physician: Darrel Reach  History: [name] is an 75 y.o. male with an episode of transient RUE weakness and slurred speech.   Indications for study:  eval for seizure  Technique: This is an 18 channel routine scalp EEG performed at the bedside with bipolar and monopolar montages arranged in accordance to the international 10/20 system of electrode placement.   Description: EKG channel demonstrates regular rhythm with a rate of 60/  His dominant posterior rhythm is 8-10 Hz. This is symmetric and reacts as expected with eye opening. There is some focal intermixed slowing in the left temporoparietal region consisting of theta activity.   No epileptiform discharges are seen. No seizures are recorded.   Interpretation:  This EEG is notable for focal intermixed slowing in the left temporoparietal region, suggesting regional cortical dysfunction in the area. There is nothing to suggest seizure on this study.   Melba Coon, MD 8:23 PM

## 2015-10-03 NOTE — Progress Notes (Signed)
PT Cancellation Note  Patient Details Name: Theodore Mendoza MRN: [id number] DOB: July 03, 1940   Cancelled Treatment:    Reason Eval/Treat Not Completed:  (pt declined, "i'm going home.")  PT/OT entered room and found pt seated up in bed with wife present at bedside. Pt and wife refused PT/OT treatment this afternoon as pt is anxious to discharge home and doesn't feel he needs any therapies. Talked with RN, RN states that pt is steady assist for stand pivot to/from [facility] using braces. Notified RN that pt refused treatment this afternoon. IF pt still here tomorrow, will re-attempt to see him.    Kingsley Callander 10/03/2015, 3:54 PM   Kittie Plater, PT, DPT Pager #: [phone] Office #: [phone]

## 2015-10-03 NOTE — Care Management Note (Addendum)
Case Management Note  Patient Details  Name: Theodore Mendoza MRN: [id number] Date of Birth: 03/03/1941  Subjective/Objective:                    Action/Plan: Plan is for patient to discharge home with [facility] services. CM met with the patient and his wife and provided them a list of [facility] agencies in [place]. They selected [facility] Marie with [facility] notified. Per patient he does not need any equipment at home. Will update the bedside RN.   Expected Discharge Date:                  Expected Discharge Plan:  [place]  In-House Referral:     Discharge planning Services  CM Consult  Post Acute Care Choice:  Home Health Choice offered to:  Patient, Spouse  DME Arranged:    DME Agency:     HH Arranged:  PT, OT, Nurse's Aide [place] Agency:  [place]  Status of Service:  Completed, signed off  If discussed at [place] of Stay Meetings, dates discussed:    Additional Comments:  Pollie Friar, RN 10/03/2015, 12:16 PM

## 2015-10-03 NOTE — Progress Notes (Signed)
VASCULAR LAB PRELIMINARY  PRELIMINARY  PRELIMINARY  PRELIMINARY  Carotid duplex has been completed.     Bilateral:  1-39% ICA stenosis.  Vertebral artery flow is antegrade.     Aubrea Meixner, RVT, RDMS 10/03/2015, 2:07 PM

## 2015-10-03 NOTE — Progress Notes (Addendum)
STROKE TEAM PROGRESS NOTE   SUBJECTIVE (INTERVAL HISTORY) His family are at the bedside. They report his first episode of right UE weakness occurred on Sat. He had another one this am. Symptoms now resolved.EEG pending.    OBJECTIVE Temp:  [97.4 F (36.3 C)-99.7 F (37.6 C)] 99.2 F (37.3 C) (07/03 0959) Pulse Rate:  [51-69] 65 (07/03 0959) Cardiac Rhythm:  [-] Sinus bradycardia (07/03 0700) Resp:  [16-20] 16 (07/03 0959) BP: (128-174)/(53-75) 174/69 mmHg (07/03 0959) SpO2:  [95 %-97 %] 97 % (07/03 0959)  CBC:   Recent Labs Lab 10/01/15 2053 10/01/15 2101  WBC 8.3  --   NEUTROABS 4.9  --   HGB 13.3 13.6  HCT 39.5 40.0  MCV 91.6  --   PLT 206  --     Basic Metabolic Panel:   Recent Labs Lab 10/01/15 2053 10/01/15 2101  NA 140 141  K 4.0 4.3  CL 107 103  CO2 27  --   GLUCOSE 149* 148*  BUN 21* 24*  CREATININE 0.70 0.70  CALCIUM 8.9  --     Lipid Panel:     Component Value Date/Time   CHOL 118 10/02/2015 0723   TRIG 39 10/02/2015 0723   HDL 37* 10/02/2015 0723   CHOLHDL 3.2 10/02/2015 0723   VLDL 8 10/02/2015 0723   LDLCALC 73 10/02/2015 0723   HgbA1c:  Lab Results  Component Value Date   HGBA1C 5.3 10/02/2015   Urine Drug Screen:     Component Value Date/Time   LABOPIA NONE DETECTED 10/01/2015 2315   COCAINSCRNUR NONE DETECTED 10/01/2015 2315   LABBENZ NONE DETECTED 10/01/2015 2315   AMPHETMU NONE DETECTED 10/01/2015 2315   THCU NONE DETECTED 10/01/2015 2315   LABBARB NONE DETECTED 10/01/2015 2315      IMAGING I have personally reviewed the radiological images below and agree with the radiology interpretations.  Dg Chest 2 View 10/02/2015   No active cardiopulmonary disease.   MRI HEAD  10/02/2015  No acute infarct. Moderate chronic microvascular changes. Enlarging left lateral posterior fossa extra-axial mass consistent with meningioma now measuring 2.8 x 3.4 x 3.4 cm versus 2.2 x 3.1 x 3 cm on 2012 exam. Extension through or superior  displacement of the left tentorium impressing upon the undersurface of the posterior temporal lobe. Compression of the left cerebellum. No associated vasogenic edema. Meningioma narrows the left transverse sinus/sigmoid sinus which may be partially occluded. MR venography or CT venography can be performed for further delineation if clinically desired. Dominant dural drainage is to the right. Mild to moderate global atrophy without hydrocephalus. Small focal region of blood breakdown products posterior left subinsular region unchanged from 2012 and may represent result of prior hemorrhagic ischemia versus small cavernoma. Mild to slightly moderate maxillary sinus mucosal thickening with polypoid opacification.   MRA HEAD  10/02/2015  Mild narrowing left internal carotid artery cavernous segment with adjacent mild ectasia. Mild irregularity without significant narrowing M1 segment middle cerebral artery bilaterally. Middle cerebral artery branch vessel mild narrowing and irregularity. Mild irregularity distal vertebral arteries and basilar artery without significant narrowing. Right posterior inferior cerebellar artery not visualized. Mild to moderate narrowing portions of the superior cerebellar arteries and posterior cerebral arteries bilaterally.   Ct Head Code Stroke W/o Cm 10/01/2015  Left cerebellopontine angle mass, very slowly enlarging from 2012 although there is little change from 05/07/2014.  Chronic white matter hypodensities consistent with chronic small vessel ischemic disease.  Remote left basal ganglia infarctions.  No acute  findings.   CUS - Bilateral: 1-39% ICA stenosis. Vertebral artery flow is antegrade.   Transthoracic echocardiogram 10/02/2015 - Left ventricle: The cavity size was normal. There was moderate focal basal hypertrophy of the septum. Systolic function was normal. The estimated ejection fraction was in the range of 55% to 60%. Wall motion was normal; there were no  regional wall motion abnormalities. Doppler parameters are consistent with abnormal left ventricular relaxation (grade 1 diastolic dysfunction). Impressions: Definity used; normal LV systolic function; grade 1 diastolic dysfunction.  EEG pending    PHYSICAL EXAM Frail elderly caucasian male not in distress. . Afebrile. Head is nontraumatic. Neck is supple without bruit.    Cardiac exam no murmur or gallop. Lungs are clear to auscultation. Distal pulses are well felt. Bilateral hand wasting and flexion contractures with bilateral foot braces. Neurological Exam :  Mental Status: Alert, oriented, thought content appropriate. Speech fluent without evidence of aphasia. Able to follow commands without difficulty. Cranial Nerves: II-Visual fields were normal. III/IV/VI-Pupils were equal and reacted normally to light. Extraocular movements were full and conjugate.  V/VII-no facial numbness; mild left facial weakness, lower greater than upper facial weakness. VIII-normal. X-normal speech and symmetrical palatal movement. XI: trapezius strength/neck flexion strength normal bilaterally XII-midline tongue extension with normal strength. Motor: Normal strength proximally of upper extremities; atrophy and weakness of intrinsic hand muscles bilaterally; moderately severe weakness proximally of lower extremities and no intact voluntary movement of feet. Sensory: Normal throughout. B/l ankle braces severe wasting intrinsic hand and foot muscles with flexion contractures of distal fingers bilaterally Deep Tendon Reflexes: Absent throughout. Plantars: Mute bilaterally Cerebellar: Slightly impaired coordination of upper extremities, commensurate with severe weakness and atrophy of  Muscles. Gait : deferred   ASSESSMENT/PLAN Theodore Mendoza is a 75 y.o. male with history of hypertension, hyperlipidemia, coronary artery disease with previous MI, Ethelene Hal syndrome, meningioma, and previous stroke  by head CT presenting with speech difficulties and right-sided weakness.[name] He did not receive IV t-PA due to improving deficits.  TIA vs. seizure  Resultant - deficits resolved.  MRI  - no acute infarct. Enlarging meningioma - followed with NSG in [facility].  MRA - unremarkable  Carotid Doppler unremarkable  2D Echo - EF 55-60%. No cardiac source of emboli identified.  EEG pending  LDL 73  HgbA1c 5.3  VTE prophylaxis - SCDs Diet Heart Room service appropriate?: Yes; Fluid consistency:: Thin  aspirin 81 mg daily prior to admission, now on clopidogrel 75 mg daily  Patient counseled to be compliant with his antithrombotic medications  Ongoing aggressive stroke risk factor management  Therapy recommendations: Home health physical therapy recommended.  Disposition: Home with HH  Follow-up with Dr. Leonie Man in 2 months.  Left post fossa meningioma  Enlarging comparing with 2012  Compressing inferior left temporal lobe  EEG pending rule out seizure  Following with NSG in [facility]  Hypertension  Blood pressures running somewhat high  Permissive hypertension (OK if < 220/120) but gradually normalize in 5-7 days  Long-term BP goal normotensive  Hyperlipidemia  Home meds:  Pravachol 40 mg daily resumed in hospital  LDL 73, goal < 70  Continue statin at discharge  Other Stroke Risk Factors  Advanced age  Hx stroke/TIA  Coronary artery disease  Other Active Problems  Hx GBS 30 years ago with residue right facial drop and b/l UE<LE weakness.   Hospital day # 1  Rosalin Hawking, MD PhD Stroke Neurology 10/03/2015 5:06 PM     To contact Stroke  Continuity provider, please refer to [email or web address] After hours, contact General Neurology

## 2015-10-03 NOTE — Evaluation (Signed)
Clinical/Bedside Swallow Evaluation Patient Details  Name: Theodore Mendoza MRN: [id number] Date of Birth: January 05, 1941  Today's Date: 10/03/2015 Time: SLP Start Time (ACUTE ONLY): [id number] SLP Stop Time (ACUTE ONLY): 0822 SLP Time Calculation (min) (ACUTE ONLY): 6 min  Past Medical History:  Past Medical History  Diagnosis Date  . Hypertension   . Myocardial infarction ([place]) 1997  . Guillain-Barre syndrome ([place]) Feb 13 1986   Past Surgical History:  Past Surgical History  Procedure Laterality Date  . Cystoscopy with stent placement Left 05/08/2014    Procedure: CYSTOSCOPY, RETROGRADE PYELOGRAM WITH LEFT URETERAL STENT PLACEMENT;  Surgeon: Alexis Frock, MD;  Location: [facility];  Service: Urology;  Laterality: Left;  . Coronary angioplasty  1997    after mi  . Eye surgery Right may 2015    growth removed, july 2015left eye cataract removed, right eye catarct removed  . Gamma kniferadiation treatment  Feb 09 2014    [facility] for [facility]  . Lithotripsy  years ago  . Cystoscopy with retrograde pyelogram, ureteroscopy and stent placement Left 06/28/2014    Procedure: 1ST STAGE CYSTOSCOPY/URETEROSCOPY/STENT PLACEMENT;  Surgeon: Alexis Frock, MD;  Location: [facility];  Service: Urology;  Laterality: Left;  . Holmium laser application Left [id number]    Procedure: HOLMIUM LASER APPLICATION;  Surgeon: Alexis Frock, MD;  Location: [facility];  Service: Urology;  Laterality: Left;  . Stone extraction with basket Left 06/28/2014    Procedure: STONE EXTRACTION WITH BASKET;  Surgeon: Alexis Frock, MD;  Location: [facility];  Service: Urology;  Laterality: Left;   HPI:  75 y.o. male with medical history significant of HTN, HL, recurrent nephrolithiasis, Guillone Barre (uses wheelchair), brain tumor, CAD sp MI. Pt presented with right arm weakness and left facial droop with that occurred around 7 PM. CT head nonacute there is a left cerebral pontine angle mass slowly enlarging since 2012 but they do change since  2016 no evidence of CVA. MRI No acute infarct. CXR No active cardiopulmonary disease.   Assessment / Plan / Recommendation Clinical Impression  Pt with baseline left facial weakness and decreased ROM from prior brain tumor. Swallow function (oral and pharyngeal) were WFL's. No indications aspiration. Pt denied difficulty. Trace egg residue on right side lips; encouraged pt to check buccal cavity and lips post meals. Recommend continue regular texture, thin liquids, straws allowed, pills with thin liquid and clearance of oral cavity. No follow up needed.     Aspiration Risk  Mild aspiration risk    Diet Recommendation Regular;Thin liquid   Liquid Administration via: Cup;Straw Medication Administration: Whole meds with liquid Supervision: Patient able to self feed Compensations: Slow rate;Small sips/bites Postural Changes: Seated upright at 90 degrees    Other  Recommendations Oral Care Recommendations: Oral care BID   Follow up Recommendations  None    Frequency and Duration            Prognosis        Swallow Study   General HPI: 75 y.o. male with medical history significant of HTN, HL, recurrent nephrolithiasis, Guillone Barre (uses wheelchair), brain tumor, CAD sp MI. Pt presented with right arm weakness and left facial droop with that occurred around 7 PM. CT head nonacute there is a left cerebral pontine angle mass slowly enlarging since 2012 but they do change since 2016 no evidence of CVA. MRI No acute infarct. CXR No active cardiopulmonary disease. Type of Study: Bedside Swallow Evaluation Previous Swallow Assessment:  (none) Diet Prior to this Study:  Regular;Thin liquids Temperature Spikes Noted: No Respiratory Status: Room air History of Recent Intubation: No Behavior/Cognition: Alert;Cooperative;Pleasant mood Oral Cavity Assessment: Within Functional Limits Oral Care Completed by SLP: No Oral Cavity - Dentition: Adequate natural dentition Vision: Functional for  self-feeding Self-Feeding Abilities: Able to feed self;Needs assist;Needs set up Patient Positioning: Upright in chair Baseline Vocal Quality: Normal Volitional Cough: Strong Volitional Swallow: Able to elicit    Oral/Motor/Sensory Function Overall Oral Motor/Sensory Function:  (mild-mod from tumor)   Ice Chips Ice chips: Not tested   Thin Liquid Thin Liquid: Within functional limits Presentation: Straw    Nectar Thick Nectar Thick Liquid: Not tested   Honey Thick Honey Thick Liquid: Not tested   Puree Puree: Not tested   Solid   GO   Solid: Within functional limits    Functional Assessment Tool Used: skilled clilnical judgement Functional Limitations: Swallowing Swallow Current Status [id number]): At least 1 percent but less than 20 percent impaired, limited or restricted Swallow Goal Status [phone]): At least 1 percent but less than 20 percent impaired, limited or restricted Swallow Discharge Status [phone]): At least 1 percent but less than 20 percent impaired, limited or restricted   Houston Siren 10/03/2015,8:34 AM  Orbie Pyo Colvin Caroli.Ed [employer] [phone]

## 2015-10-03 NOTE — Care Management Obs Status (Signed)
[place] NOTIFICATION   Patient Details  Name: BECKHEM DAMERY MRN: [id number] Date of Birth: [birth year]   Medicare Observation Status Notification Given:  Yes (MRI negative)    Pollie Friar, RN 10/03/2015, 12:16 PM

## 2015-10-03 NOTE — Discharge Planning (Signed)
Secretary from Dr Dione Housekeeper office called stating they received ED referral and consult but their toner was low and the documents were unreadable.  EDCM pushed resend on communication sent from [facility]. Jakson Delpilar J. Clydene Laming, [place], [place], [employer] [phone]

## 2015-10-03 NOTE — Progress Notes (Signed)
OT Cancellation Note  Patient Details Name: Theodore Mendoza MRN: [id number] DOB: March 27, 1941   Cancelled Treatment:    Reason Eval/Treat Not Completed: Patient declined, no reason specified. PT/OT entered room and found pt seated up in bed with wife present at bedside. Pt and wife refused PT/OT treatment this afternoon as pt is anxious to discharge home and doesn't feel he needs any therapies. Talked with RN, RN states that pt is steady assist for stand pivot to/from [facility] using braces. Notified RN that pt refused treatment this afternoon. IF pt still here tomorrow, will re-attempt to see him.   Chrys Racer , MS, OTR/L, CLT Pager: [phone]  10/03/2015, 3:06 PM

## 2015-10-03 NOTE — Progress Notes (Signed)
Patient had increased right arm weakness, unable to keep hold of cup in right hand, family stated patient had worse facial droop, and speech worsened at times when speaking to patient. CBG, VSS. Neurology contacted, stated to lay patient flat, administered 500cc NS bolus. Patient placed flat, NS bolus administered. After bolus administered, patient stated right hand felt stronger, back at baseline. Other symptoms (speech, facial droop) returned to baseline.

## 2015-10-03 NOTE — Progress Notes (Signed)
EEG completed full report to follow.

## 2015-10-03 NOTE — Progress Notes (Signed)
PROGRESS NOTE  Theodore Mendoza  [id number] DOB: October 01, 1940  DOA: 10/01/2015 PCP: Sherrie Mustache, MD   Brief Narrative:  75 year old male, PMH of HTN, HLD, recurrent nephrolithiasis, Gilliam Barr syndrome (1987) with chronic quadriparesis, wheelchair mobile, brain tumor/meningioma followed at [facility] by Dr. Redmond Pulling, CAD status post MI presented to ED on 10/01/15 with transient right upper extremity weakness and slurred speech noted by patient and spouse at approximately 7 PM. No prior history of TIA nor stroke. Was on aspirin 81 MG daily. States that his symptoms resolved gradually in about 1 hour by the time he got to the ED. Admitted for evaluation of stroke/TIA. Neurology consulting.   Assessment & Plan:   Active Problems:   Essential (primary) hypertension   Hyperlipidemia   TIA (transient ischemic attack)   TIA versus seizure. - Resultant right upper extremity weakness and dysarthria-resolved. Had another transient episode of right upper extremity weakness on 10/03/15 morning - CT head 7/1: Left CP angle slow growing mass, little change from 05/07/14, chronic small vessel ischemic disease. No acute findings. - MRI brain 10/02/15: No acute infarct. Enlarging left lateral posterior foci extra-axial mass consistent with meningioma. No associated vasogenic edema. - MRA brain: No major occlusion. - 2-D echo 10/02/15: LVEF 55%. Grade 1 diastolic dysfunction. - Carotid Dopplers: 1-39 percent ICA stenosis. - A1c: 5.3. - LDL: 73 - UDS negative, blood alcohol level <5. - PT recommends home health PT & Aide - On ASA prior to admission, changed to Plavix 75 MG daily for secondary stroke prophylaxis. - Neurology follow up appreciated. Discussed with Dr. Erlinda Hong, concern of seizure from Meningioma, d/t recurrent nature of symptoms: follow up EKG.   Meningioma - Slowly growing per repeat imaging. As discussed with neurology, continue outpatient follow-up with neurosurgery at [facility].  Essential hypertension - Allow for permissive hypertension. Continue lisinopril, metoprolol and amlodipine.  Hyperlipidemia - LDL 73, goal <70. Continue pravastatin 40 mg daily.  Chronic quadriparesis secondary to Ethelene Hal syndrome 1987  - (paraplegia worse than upper extremity distal weakness) - Back to baseline per patient.   DVT prophylaxis: SCD's Code Status: Full Family Communication: Discussed with patient. No family at bedside. Disposition Plan: DC home pending completion of TIA Vs Seizure workup, possibly 10/04/15.   Consultants:   Neurology  Procedures:   2-D echo 10/02/15: Study Conclusions  - Left ventricle: The cavity size was normal. There was moderate  focal basal hypertrophy of the septum. Systolic function was  normal. The estimated ejection fraction was in the range of 55%  to 60%. Wall motion was normal; there were no regional wall  motion abnormalities. Doppler parameters are consistent with  abnormal left ventricular relaxation (grade 1 diastolic  dysfunction).  Impressions:  - Definity used; normal LV systolic function; grade 1 diastolic  dysfunction.  Antimicrobials:   None    Subjective: Seen this morning and patient had no recurrence of symptoms but subsequently had an episode of transient right upper extremity weakness which resolved.  Objective:  Filed Vitals:   10/03/15 0938 10/03/15 0959 10/03/15 1356 10/03/15 1654  BP: 159/67 174/69 149/61 161/62  Pulse: 68 65 52 57  Temp: 99.7 F (37.6 C) 99.2 F (37.3 C) 98.6 F (37 C) 98.6 F (37 C)  TempSrc: Axillary Oral Oral Oral  Resp: 20 16 20 16   Height:      Weight:      SpO2: 97% 97% 96% 99%    Intake/Output Summary (Last 24 hours) at 10/03/15 1754 Last  data filed at 10/03/15 0829  Gross per 24 hour  Intake    120 ml  Output    350 ml  Net   -230 ml   Filed Weights   10/01/15 2045  Weight: 78.472 kg (173 lb)    Examination:  General exam:  Pleasant elderly male sitting up in bed eating lunch this afternoon. Respiratory system: Clear to auscultation. Respiratory effort normal. Cardiovascular system: S1 & S2 heard, RRR. No JVD, murmurs, rubs, gallops or clicks. No pedal edema. Telemetry: SB in the 50s-SR. Gastrointestinal system: Abdomen is nondistended, soft and nontender. No organomegaly or masses felt. Normal bowel sounds heard. Central nervous system: Alert and oriented. No cranial nerve deficits Extremities: Chronic quadriparesis. Grade 3 x 5 power in lower extremities with foot drop. Symmetrical distal upper extremity weakness with decreased grip. Skin: No rashes, lesions or ulcers Psychiatry: Judgement and insight appear normal. Mood & affect appropriate.     Data Reviewed: I have personally reviewed following labs and imaging studies  CBC:  Recent Labs Lab 10/01/15 2053 10/01/15 2101  WBC 8.3  --   NEUTROABS 4.9  --   HGB 13.3 13.6  HCT 39.5 40.0  MCV 91.6  --   PLT 206  --    Basic Metabolic Panel:  Recent Labs Lab 10/01/15 2053 10/01/15 2101  NA 140 141  K 4.0 4.3  CL 107 103  CO2 27  --   GLUCOSE 149* 148*  BUN 21* 24*  CREATININE 0.70 0.70  CALCIUM 8.9  --    GFR: Estimated Creatinine Clearance: 74.8 mL/min (by C-G formula based on Cr of 0.7). Liver Function Tests:  Recent Labs Lab 10/01/15 2053  AST 15  ALT 11*  ALKPHOS 64  BILITOT 0.8  PROT 6.4*  ALBUMIN 4.1   No results for input(s): LIPASE, AMYLASE in the last 168 hours. No results for input(s): AMMONIA in the last 168 hours. Coagulation Profile:  Recent Labs Lab 10/01/15 2053  INR 1.14   Cardiac Enzymes:  Recent Labs Lab 10/02/15 0723  TROPONINI <0.03   BNP (last 3 results) No results for input(s): PROBNP in the last 8760 hours. HbA1C:  Recent Labs  10/02/15 0723  HGBA1C 5.3   CBG:  Recent Labs Lab 10/03/15 1001  GLUCAP 110*   Lipid Profile:  Recent Labs  10/02/15 0723  CHOL 118  HDL 37*   LDLCALC 73  TRIG 39  CHOLHDL 3.2   Thyroid Function Tests: No results for input(s): TSH, T4TOTAL, FREET4, T3FREE, THYROIDAB in the last 72 hours. Anemia Panel: No results for input(s): VITAMINB12, FOLATE, FERRITIN, TIBC, IRON, RETICCTPCT in the last 72 hours.  Sepsis Labs: No results for input(s): PROCALCITON, LATICACIDVEN in the last 168 hours.  No results found for this or any previous visit (from the past 240 hour(s)).       Radiology Studies: Dg Chest 2 View  10/02/2015  CLINICAL DATA:  TIA EXAM: CHEST  2 VIEW COMPARISON:  05/07/2014 FINDINGS: The heart size and mediastinal contours are within normal limits. Both lungs are clear. The visualized skeletal structures are unremarkable. IMPRESSION: No active cardiopulmonary disease. Electronically Signed   By: Inez Catalina M.D.   On: 10/02/2015 08:30   Mr Brain Wo Contrast  10/02/2015  CLINICAL DATA:  75 year old hypertensive male with hyperlipidemia, prior myocardial infarction and Guillain-Barre presenting with transient right-sided weakness and left facial droop. Subsequent encounter. EXAM: MRI HEAD WITHOUT CONTRAST MRA HEAD WITHOUT CONTRAST TECHNIQUE: Multiplanar, multiecho pulse sequences of the brain  and surrounding structures were obtained without intravenous contrast. Angiographic images of the head were obtained using MRA technique without contrast. COMPARISON:  10/01/2015 and 05/07/2014 head CT. 05/28/2010 brain MR. FINDINGS: MRI HEAD FINDINGS No acute infarct. Small focal region of blood breakdown products posterior left subinsular region unchanged from 2012 and may represent result of prior hemorrhagic ischemia versus small cavernoma. Moderate chronic microvascular changes. Enlarging extra-axial mass centered in the left lateral posterior fossa now measuring 2.8 x 3.4 x 3.4 cm versus 2.2 x 3.1 x 3 cm on 2012 exam. Characteristics of a meningioma with extension through or superior displacement of the left tentorium impressing upon  the undersurface of the posterior temporal lobe. Compression of the left cerebellum. No associated vasogenic edema. Meningioma narrows the left transverse sinus/sigmoid sinus which may be partially occluded. MR venography or CT venography can be performed for further delineation if clinically desired. Dominant dural drainage is to the right. Mild to moderate global atrophy without hydrocephalus. Post lens replacement without acute orbital abnormality. Mild to slightly moderate maxillary sinus mucosal thickening with polypoid opacification. Cervical medullary junction unremarkable. MRA HEAD FINDINGS Mild narrowing left internal carotid artery cavernous segment with adjacent mild ectasia. Mild irregularity without significant narrowing M1 segment middle cerebral artery bilaterally. Middle cerebral artery branch vessel mild narrowing and irregularity. Mild irregularity distal vertebral arteries and basilar artery without significant narrowing. Right posterior inferior cerebellar artery not visualized. Small anterior inferior cerebellar arteries. Mild to moderate narrowing portions of the superior cerebellar arteries and posterior cerebral arteries bilaterally. No aneurysm noted. IMPRESSION: MRI HEAD No acute infarct. Moderate chronic microvascular changes. Enlarging left lateral posterior fossa extra-axial mass consistent with meningioma now measuring 2.8 x 3.4 x 3.4 cm versus 2.2 x 3.1 x 3 cm on 2012 exam. Extension through or superior displacement of the left tentorium impressing upon the undersurface of the posterior temporal lobe. Compression of the left cerebellum. No associated vasogenic edema. Meningioma narrows the left transverse sinus/sigmoid sinus which may be partially occluded. MR venography or CT venography can be performed for further delineation if clinically desired. Dominant dural drainage is to the right. Mild to moderate global atrophy without hydrocephalus. Small focal region of blood breakdown  products posterior left subinsular region unchanged from 2012 and may represent result of prior hemorrhagic ischemia versus small cavernoma. Mild to slightly moderate maxillary sinus mucosal thickening with polypoid opacification. MRA HEAD Mild narrowing left internal carotid artery cavernous segment with adjacent mild ectasia. Mild irregularity without significant narrowing M1 segment middle cerebral artery bilaterally. Middle cerebral artery branch vessel mild narrowing and irregularity. Mild irregularity distal vertebral arteries and basilar artery without significant narrowing. Right posterior inferior cerebellar artery not visualized. Mild to moderate narrowing portions of the superior cerebellar arteries and posterior cerebral arteries bilaterally. Electronically Signed   By: Genia Del M.D.   On: 10/02/2015 07:37   Mr Mra Head/brain Wo Cm  10/02/2015  CLINICAL DATA:  75 year old hypertensive male with hyperlipidemia, prior myocardial infarction and Guillain-Barre presenting with transient right-sided weakness and left facial droop. Subsequent encounter. EXAM: MRI HEAD WITHOUT CONTRAST MRA HEAD WITHOUT CONTRAST TECHNIQUE: Multiplanar, multiecho pulse sequences of the brain and surrounding structures were obtained without intravenous contrast. Angiographic images of the head were obtained using MRA technique without contrast. COMPARISON:  10/01/2015 and 05/07/2014 head CT. 05/28/2010 brain MR. FINDINGS: MRI HEAD FINDINGS No acute infarct. Small focal region of blood breakdown products posterior left subinsular region unchanged from 2012 and may represent result of prior hemorrhagic ischemia versus small  cavernoma. Moderate chronic microvascular changes. Enlarging extra-axial mass centered in the left lateral posterior fossa now measuring 2.8 x 3.4 x 3.4 cm versus 2.2 x 3.1 x 3 cm on 2012 exam. Characteristics of a meningioma with extension through or superior displacement of the left tentorium impressing  upon the undersurface of the posterior temporal lobe. Compression of the left cerebellum. No associated vasogenic edema. Meningioma narrows the left transverse sinus/sigmoid sinus which may be partially occluded. MR venography or CT venography can be performed for further delineation if clinically desired. Dominant dural drainage is to the right. Mild to moderate global atrophy without hydrocephalus. Post lens replacement without acute orbital abnormality. Mild to slightly moderate maxillary sinus mucosal thickening with polypoid opacification. Cervical medullary junction unremarkable. MRA HEAD FINDINGS Mild narrowing left internal carotid artery cavernous segment with adjacent mild ectasia. Mild irregularity without significant narrowing M1 segment middle cerebral artery bilaterally. Middle cerebral artery branch vessel mild narrowing and irregularity. Mild irregularity distal vertebral arteries and basilar artery without significant narrowing. Right posterior inferior cerebellar artery not visualized. Small anterior inferior cerebellar arteries. Mild to moderate narrowing portions of the superior cerebellar arteries and posterior cerebral arteries bilaterally. No aneurysm noted. IMPRESSION: MRI HEAD No acute infarct. Moderate chronic microvascular changes. Enlarging left lateral posterior fossa extra-axial mass consistent with meningioma now measuring 2.8 x 3.4 x 3.4 cm versus 2.2 x 3.1 x 3 cm on 2012 exam. Extension through or superior displacement of the left tentorium impressing upon the undersurface of the posterior temporal lobe. Compression of the left cerebellum. No associated vasogenic edema. Meningioma narrows the left transverse sinus/sigmoid sinus which may be partially occluded. MR venography or CT venography can be performed for further delineation if clinically desired. Dominant dural drainage is to the right. Mild to moderate global atrophy without hydrocephalus. Small focal region of blood  breakdown products posterior left subinsular region unchanged from 2012 and may represent result of prior hemorrhagic ischemia versus small cavernoma. Mild to slightly moderate maxillary sinus mucosal thickening with polypoid opacification. MRA HEAD Mild narrowing left internal carotid artery cavernous segment with adjacent mild ectasia. Mild irregularity without significant narrowing M1 segment middle cerebral artery bilaterally. Middle cerebral artery branch vessel mild narrowing and irregularity. Mild irregularity distal vertebral arteries and basilar artery without significant narrowing. Right posterior inferior cerebellar artery not visualized. Mild to moderate narrowing portions of the superior cerebellar arteries and posterior cerebral arteries bilaterally. Electronically Signed   By: Genia Del M.D.   On: 10/02/2015 07:37   Ct Head Code Stroke W/o Cm  10/01/2015  CLINICAL DATA:  Right-sided weakness. Left-sided facial droop. Onset 19:00. EXAM: CT HEAD WITHOUT CONTRAST TECHNIQUE: Contiguous axial images were obtained from the base of the skull through the vertex without intravenous contrast. 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 There is no intracranial hemorrhage or evidence of acute infarction. There is mild generalized atrophy. There is mild chronic microvascular ischemic change. There are remote lacunar infarctions in the left basal ganglia. There is no significant extra-axial fluid collection. No acute intracranial findings are evident. The calvarium and skullbase are intact. Visible paranasal sinuses and orbits are unremarkable. IMPRESSION: 1. Left cerebellopontine angle mass, very slowly enlarging from 2012 although there is little change from 05/07/2014. 2. Chronic white matter hypodensities consistent with chronic small vessel  ischemic disease. Remote left basal ganglia infarctions. No acute findings. Electronically  Signed   By: Andreas Newport M.D.   On: 10/01/2015 21:14        Scheduled Meds: .  stroke: mapping our early stages of recovery book   Does not apply Once  . sodium chloride   Intravenous Once  . amLODipine  2.5 mg Oral q morning - 10a  . clopidogrel  75 mg Oral Daily  . escitalopram  10 mg Oral QHS  . gabapentin  300 mg Oral TID  . lisinopril  2.5 mg Oral q morning - 10a  . metoprolol tartrate  25 mg Oral q morning - 10a  . pravastatin  40 mg Oral Q lunch   Continuous Infusions:        Time spent: 20 minutes    Irl Bodie, MD [facility] Pager [phone]  If 7PM-7AM, please contact night-coverage [email or web address] Password [facility] 10/03/2015, 5:54 PM

## 2015-10-04 DIAGNOSIS — E785 Hyperlipidemia, unspecified: Secondary | ICD-10-CM | POA: Diagnosis not present

## 2015-10-04 DIAGNOSIS — G459 Transient cerebral ischemic attack, unspecified: Secondary | ICD-10-CM | POA: Diagnosis not present

## 2015-10-04 DIAGNOSIS — I10 Essential (primary) hypertension: Secondary | ICD-10-CM | POA: Diagnosis not present

## 2015-10-04 LAB — VAS US CAROTID
LEFT ECA DIAS: -19 cm/s
LEFT VERTEBRAL DIAS: -11 cm/s
Left CCA dist dias: 16 cm/s
Left CCA dist sys: 86 cm/s
Left CCA prox dias: 21 cm/s
Left CCA prox sys: 90 cm/s
Left ICA dist dias: -26 cm/s
Left ICA dist sys: -77 cm/s
Left ICA prox dias: 19 cm/s
Left ICA prox sys: 95 cm/s
RCCADSYS: -92 cm/s
RCCAPDIAS: -3 cm/s
RIGHT ECA DIAS: 9 cm/s
RIGHT VERTEBRAL DIAS: -16 cm/s
Right CCA prox sys: -67 cm/s

## 2015-10-04 MED ORDER — CLOPIDOGREL BISULFATE 75 MG PO TABS: 75.0000 mg | ORAL_TABLET | Freq: Every day | ORAL | Status: DC

## 2015-10-04 NOTE — Discharge Summary (Signed)
Physician Discharge Summary  Theodore Mendoza [id number] DOB: 1940-05-29  PCP: Sherrie Mustache, MD  Admit date: 10/01/2015 Discharge date: 10/04/2015  Admitted From: Home Disposition:  Home  Recommendations for Outpatient Follow-up:  1. Dr. Dione Housekeeper, PCP: Patient has prior appointment on 10/05/15 and advised to keep same.  2. Dr. Antony Contras, Neurology in 2 months. MDs office will call patient with appointment. 3. Patient also follows up at [facility] regarding his meningioma.  Home Health: PT, OT and aide Equipment/Devices: Foot drop splints.    Discharge Condition: Improved and stable.  CODE STATUS: Full  Diet recommendation: Heart healthy diet.  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 Redmond Pulling, CAD status post MI presented to ED on 10/01/15 with transient right upper extremity weakness and slurred speech noted by patient and spouse at approximately 7 PM. No prior history of TIA nor stroke. Was on aspirin 81 MG daily. States that his symptoms resolved gradually in about 1 hour by the time he got to the ED. Admitted for evaluation of stroke/TIA. Neurology consulted.  Discharge Diagnoses:  Active Problems:   Essential (primary) hypertension   Hyperlipidemia   TIA (transient ischemic attack)  TIA versus seizure. - Resultant right upper extremity weakness and dysarthria-resolved. Had another transient episode of right upper extremity weakness on 10/03/15 morning. No further recurrence. - CT head 7/1: Left CP angle slow growing mass, little change from 05/07/14, chronic small vessel ischemic disease. No acute findings. - MRI brain 10/02/15: No acute infarct. Enlarging left lateral posterior foci extra-axial mass consistent with meningioma. No associated vasogenic edema. - MRA brain: No  major occlusion. - 2-D echo 10/02/15: LVEF 55%. Grade 1 diastolic dysfunction. - Carotid Dopplers: 1-39 percent ICA stenosis. - A1c: 5.3. - LDL: 73 - UDS negative, blood alcohol level <5. - PT & OT recommends home health PT, OT & Aide - On ASA prior to admission, changed to Plavix 75 MG daily for secondary stroke prophylaxis. - Due to transient recurrent right upper extremity weakness on 7/3, neurology requested EEG-report as below which did not show seizure like activity. Patient has had no further recurrence of symptoms. Discussed with stroke M.D., Dr.Jindong Erlinda Hong who has cleared patient for discharge, no AEDs warranted at this time, continue usual follow-up for meningioma with his doctors at [facility]. He indicates that the abnormal findings on EEG may be related to the meningioma.  Meningioma - Slowly growing per repeat imaging. As discussed with neurology, continue outpatient follow-up with neurosurgery at [facility].  Essential hypertension - Continue lisinopril, metoprolol and amlodipine.  Hyperlipidemia - LDL 73, goal <70. Continue pravastatin 40 mg daily.  Chronic quadriparesis secondary to Ethelene Hal syndrome 1987  - (paraplegia worse than upper extremity distal weakness) - Back to baseline per patient and family including his spouse and daughter at bedside.   Consultants:   Neurology  Procedures:   2-D echo 10/02/15: Study Conclusions  - Left ventricle: The cavity size was normal. There was moderate  focal basal hypertrophy of the septum. Systolic function was  normal. The estimated ejection fraction was in the range of 55%  to 60%. Wall motion was normal; there were no regional wall  motion abnormalities. Doppler parameters are consistent with  abnormal left ventricular relaxation (grade 1 diastolic  dysfunction).  Impressions:  - Definity used; normal LV systolic function; grade 1  diastolic  dysfunction.   EEG  10/03/15: Interpretation:  This EEG is notable for focal intermixed slowing in the left temporoparietal region, suggesting regional cortical dysfunction in the area. There is nothing to suggest seizure on this study.    Carotid Dopplers 10/03/15: Bilateral: 1-39% ICA stenosis. Vertebral artery flow is antegrade.   Discharge Instructions      Discharge Instructions    Ambulatory referral to Neurology    Complete by:  As directed   Dr. Leonie Man requests follow up for this patient in 2 months.     Call MD for:    Complete by:  As directed   Strokelike symptoms.     Diet - low sodium heart healthy    Complete by:  As directed      Increase activity slowly    Complete by:  As directed             Medication List    STOP taking these medications        aspirin EC 81 MG tablet      TAKE these medications        amLODipine 2.5 MG tablet  Commonly known as:  NORVASC  Take 2.5 mg by mouth every morning.     clopidogrel 75 MG tablet  Commonly known as:  PLAVIX  Take 1 tablet (75 mg total) by mouth daily.     escitalopram 10 MG tablet  Commonly known as:  LEXAPRO  Take 10 mg by mouth at bedtime.     gabapentin 300 MG capsule  Commonly known as:  NEURONTIN  Take 300 mg by mouth 3 (three) times daily.     lisinopril 2.5 MG tablet  Commonly known as:  PRINIVIL,ZESTRIL  Take 2.5 mg by mouth every morning.     metoprolol tartrate 25 MG tablet  Commonly known as:  LOPRESSOR  Take 25 mg by mouth every morning.     naproxen sodium 220 MG tablet  Commonly known as:  ANAPROX  Take 220 mg by mouth 2 (two) times daily as needed (headache.).     pravastatin 40 MG tablet  Commonly known as:  PRAVACHOL  Take 40 mg by mouth daily with lunch.       Follow-up Information    Follow up with SETHI,PRAMOD, MD In 2 months.   Specialties:  Neurology, Radiology   Why:  Stroke Clinic, Office will call you with appointment date & time   Contact information:   [place] [address] [phone]       Follow up with Sherrie Mustache, MD On 10/05/2015.   Specialty:  Family Medicine   Why:  Keep you prior appointment.   Contact information:   [address] [place] [address] [phone]      Allergies  Allergen Reactions  . Hydrocodone-Acetaminophen Other (See Comments)    Confusion, weakness, hallucinations, mental status changes  . Morphine And Related Other (See Comments)    Confusion, weakness, hallucinations, mental status changes  . Valsartan Rash     Procedures/Studies: Dg Chest 2 View  10/02/2015  CLINICAL DATA:  TIA EXAM: CHEST  2 VIEW COMPARISON:  05/07/2014 FINDINGS: The heart size and mediastinal contours are within normal limits. Both lungs are clear. The visualized skeletal structures are unremarkable. IMPRESSION: No active cardiopulmonary disease. Electronically Signed   By: Inez Catalina M.D.   On: 10/02/2015 08:30   Mr Brain Wo Contrast  10/02/2015  CLINICAL DATA:  75 year old hypertensive male with  hyperlipidemia, prior myocardial infarction and Guillain-Barre presenting with transient right-sided weakness and left facial droop. Subsequent encounter. EXAM: MRI HEAD WITHOUT CONTRAST MRA HEAD WITHOUT CONTRAST TECHNIQUE: Multiplanar, multiecho pulse sequences of the brain and surrounding structures were obtained without intravenous contrast. Angiographic images of the head were obtained using MRA technique without contrast. COMPARISON:  10/01/2015 and 05/07/2014 head CT. 05/28/2010 brain MR. FINDINGS: MRI HEAD FINDINGS No acute infarct. Small focal region of blood breakdown products posterior left subinsular region unchanged from 2012 and may represent result of prior hemorrhagic ischemia versus small cavernoma. Moderate chronic microvascular changes. Enlarging extra-axial mass centered in the left lateral posterior fossa now measuring 2.8 x 3.4 x 3.4 cm versus 2.2 x 3.1 x 3 cm on 2012 exam. Characteristics  of a meningioma with extension through or superior displacement of the left tentorium impressing upon the undersurface of the posterior temporal lobe. Compression of the left cerebellum. No associated vasogenic edema. Meningioma narrows the left transverse sinus/sigmoid sinus which may be partially occluded. MR venography or CT venography can be performed for further delineation if clinically desired. Dominant dural drainage is to the right. Mild to moderate global atrophy without hydrocephalus. Post lens replacement without acute orbital abnormality. Mild to slightly moderate maxillary sinus mucosal thickening with polypoid opacification. Cervical medullary junction unremarkable. MRA HEAD FINDINGS Mild narrowing left internal carotid artery cavernous segment with adjacent mild ectasia. Mild irregularity without significant narrowing M1 segment middle cerebral artery bilaterally. Middle cerebral artery branch vessel mild narrowing and irregularity. Mild irregularity distal vertebral arteries and basilar artery without significant narrowing. Right posterior inferior cerebellar artery not visualized. Small anterior inferior cerebellar arteries. Mild to moderate narrowing portions of the superior cerebellar arteries and posterior cerebral arteries bilaterally. No aneurysm noted. IMPRESSION: MRI HEAD No acute infarct. Moderate chronic microvascular changes. Enlarging left lateral posterior fossa extra-axial mass consistent with meningioma now measuring 2.8 x 3.4 x 3.4 cm versus 2.2 x 3.1 x 3 cm on 2012 exam. Extension through or superior displacement of the left tentorium impressing upon the undersurface of the posterior temporal lobe. Compression of the left cerebellum. No associated vasogenic edema. Meningioma narrows the left transverse sinus/sigmoid sinus which may be partially occluded. MR venography or CT venography can be performed for further delineation if clinically desired. Dominant dural drainage is to the  right. Mild to moderate global atrophy without hydrocephalus. Small focal region of blood breakdown products posterior left subinsular region unchanged from 2012 and may represent result of prior hemorrhagic ischemia versus small cavernoma. Mild to slightly moderate maxillary sinus mucosal thickening with polypoid opacification. MRA HEAD Mild narrowing left internal carotid artery cavernous segment with adjacent mild ectasia. Mild irregularity without significant narrowing M1 segment middle cerebral artery bilaterally. Middle cerebral artery branch vessel mild narrowing and irregularity. Mild irregularity distal vertebral arteries and basilar artery without significant narrowing. Right posterior inferior cerebellar artery not visualized. Mild to moderate narrowing portions of the superior cerebellar arteries and posterior cerebral arteries bilaterally. Electronically Signed   By: Genia Del M.D.   On: 10/02/2015 07:37   Mr Mra Head/brain Wo Cm  10/02/2015  CLINICAL DATA:  75 year old hypertensive male with hyperlipidemia, prior myocardial infarction and Guillain-Barre presenting with transient right-sided weakness and left facial droop. Subsequent encounter. EXAM: MRI HEAD WITHOUT CONTRAST MRA HEAD WITHOUT CONTRAST TECHNIQUE: Multiplanar, multiecho pulse sequences of the brain and surrounding structures were obtained without intravenous contrast. Angiographic images of the head were obtained using MRA technique without contrast. COMPARISON:  10/01/2015 and 05/07/2014 head CT.  05/28/2010 brain MR. FINDINGS: MRI HEAD FINDINGS No acute infarct. Small focal region of blood breakdown products posterior left subinsular region unchanged from 2012 and may represent result of prior hemorrhagic ischemia versus small cavernoma. Moderate chronic microvascular changes. Enlarging extra-axial mass centered in the left lateral posterior fossa now measuring 2.8 x 3.4 x 3.4 cm versus 2.2 x 3.1 x 3 cm on 2012 exam.  Characteristics of a meningioma with extension through or superior displacement of the left tentorium impressing upon the undersurface of the posterior temporal lobe. Compression of the left cerebellum. No associated vasogenic edema. Meningioma narrows the left transverse sinus/sigmoid sinus which may be partially occluded. MR venography or CT venography can be performed for further delineation if clinically desired. Dominant dural drainage is to the right. Mild to moderate global atrophy without hydrocephalus. Post lens replacement without acute orbital abnormality. Mild to slightly moderate maxillary sinus mucosal thickening with polypoid opacification. Cervical medullary junction unremarkable. MRA HEAD FINDINGS Mild narrowing left internal carotid artery cavernous segment with adjacent mild ectasia. Mild irregularity without significant narrowing M1 segment middle cerebral artery bilaterally. Middle cerebral artery branch vessel mild narrowing and irregularity. Mild irregularity distal vertebral arteries and basilar artery without significant narrowing. Right posterior inferior cerebellar artery not visualized. Small anterior inferior cerebellar arteries. Mild to moderate narrowing portions of the superior cerebellar arteries and posterior cerebral arteries bilaterally. No aneurysm noted. IMPRESSION: MRI HEAD No acute infarct. Moderate chronic microvascular changes. Enlarging left lateral posterior fossa extra-axial mass consistent with meningioma now measuring 2.8 x 3.4 x 3.4 cm versus 2.2 x 3.1 x 3 cm on 2012 exam. Extension through or superior displacement of the left tentorium impressing upon the undersurface of the posterior temporal lobe. Compression of the left cerebellum. No associated vasogenic edema. Meningioma narrows the left transverse sinus/sigmoid sinus which may be partially occluded. MR venography or CT venography can be performed for further delineation if clinically desired. Dominant dural  drainage is to the right. Mild to moderate global atrophy without hydrocephalus. Small focal region of blood breakdown products posterior left subinsular region unchanged from 2012 and may represent result of prior hemorrhagic ischemia versus small cavernoma. Mild to slightly moderate maxillary sinus mucosal thickening with polypoid opacification. MRA HEAD Mild narrowing left internal carotid artery cavernous segment with adjacent mild ectasia. Mild irregularity without significant narrowing M1 segment middle cerebral artery bilaterally. Middle cerebral artery branch vessel mild narrowing and irregularity. Mild irregularity distal vertebral arteries and basilar artery without significant narrowing. Right posterior inferior cerebellar artery not visualized. Mild to moderate narrowing portions of the superior cerebellar arteries and posterior cerebral arteries bilaterally. Electronically Signed   By: Genia Del M.D.   On: 10/02/2015 07:37   Ct Head Code Stroke W/o Cm  10/01/2015  CLINICAL DATA:  Right-sided weakness. Left-sided facial droop. Onset 19:00. EXAM: CT HEAD WITHOUT CONTRAST TECHNIQUE: Contiguous axial images were obtained from the base of the skull through the vertex without intravenous contrast. 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 There is no intracranial hemorrhage or evidence of acute infarction. There is mild generalized atrophy. There is mild chronic microvascular ischemic change. There are remote lacunar infarctions in the left basal ganglia. There is no significant extra-axial fluid collection. No acute intracranial findings are evident. The calvarium and skullbase are intact. Visible paranasal sinuses and orbits are unremarkable. IMPRESSION: 1. Left cerebellopontine angle mass,  very slowly enlarging from 2012 although there is  little change from 05/07/2014. 2. Chronic white matter hypodensities consistent with chronic small vessel ischemic disease. Remote left basal ganglia infarctions. No acute findings. Electronically Signed   By: Andreas Newport M.D.   On: 10/01/2015 21:14      Subjective: Denies complaints. Anxious to go home. Transient right upper extremity weakness that occurred on 10/03/15 which lasted approximately an hour and resolved spontaneously without further recurrence. No dysarthria or facial asymmetry per spouse at bedside.  Discharge Exam:  Filed Vitals:   10/04/15 0122 10/04/15 0415 10/04/15 0951 10/04/15 1418  BP: 151/97 168/60 156/65 144/68  Pulse: 60 62 58 56  Temp: 97.7 F (36.5 C) 98.4 F (36.9 C) 98.4 F (36.9 C) 98.2 F (36.8 C)  TempSrc: Oral Oral Oral Oral  Resp: 18 18 18 18   Height:      Weight:      SpO2: 97% 97% 98% 96%    General exam: Pleasant elderly male sitting up comfortably in bed. Spouse and daughter at bedside. Respiratory system: Clear to auscultation. Respiratory effort normal. Cardiovascular system: S1 & S2 heard, RRR. No JVD, murmurs, rubs, gallops or clicks. No pedal edema. Telemetry: SB in the 50's - SR in 60's. Gastrointestinal system: Abdomen is nondistended, soft and nontender. No organomegaly or masses felt. Normal bowel sounds heard. Central nervous system: Alert and oriented. No cranial nerve deficits Extremities: Chronic quadriparesis. Grade 3 x 5 power in lower extremities with foot drop. Symmetrical distal upper extremity weakness with decreased grip. Skin: No rashes, lesions or ulcers Psychiatry: Judgement and insight appear normal. Mood & affect appropriate.    The results of significant diagnostics from this hospitalization (including imaging, microbiology, ancillary and laboratory) are listed below for reference.     Microbiology: No results found for this or any previous visit (from the past 240 hour(s)).   Labs: BNP (last 3 results) No  results for input(s): BNP in the last 8760 hours. Basic Metabolic Panel:  Recent Labs Lab 10/01/15 2053 10/01/15 2101  NA 140 141  K 4.0 4.3  CL 107 103  CO2 27  --   GLUCOSE 149* 148*  BUN 21* 24*  CREATININE 0.70 0.70  CALCIUM 8.9  --    Liver Function Tests:  Recent Labs Lab 10/01/15 2053  AST 15  ALT 11*  ALKPHOS 64  BILITOT 0.8  PROT 6.4*  ALBUMIN 4.1   No results for input(s): LIPASE, AMYLASE in the last 168 hours. No results for input(s): AMMONIA in the last 168 hours. CBC:  Recent Labs Lab 10/01/15 2053 10/01/15 2101  WBC 8.3  --   NEUTROABS 4.9  --   HGB 13.3 13.6  HCT 39.5 40.0  MCV 91.6  --   PLT 206  --    Cardiac Enzymes:  Recent Labs Lab 10/02/15 0723  TROPONINI <0.03   BNP: Invalid input(s): POCBNP CBG:  Recent Labs Lab 10/03/15 1001  GLUCAP 110*   D-Dimer No results for input(s): DDIMER in the last 72 hours. Hgb A1c  Recent Labs  10/02/15 0723  HGBA1C 5.3   Lipid Profile  Recent Labs  10/02/15 0723  CHOL 118  HDL 37*  LDLCALC 73  TRIG 39  CHOLHDL 3.2   Thyroid function studies No results for input(s): TSH, T4TOTAL, T3FREE, THYROIDAB in the last 72 hours.  Invalid input(s): FREET3 Anemia work up No results for input(s): VITAMINB12, FOLATE, FERRITIN, TIBC, IRON, RETICCTPCT in the last 72 hours. Urinalysis  Component Value Date/Time   COLORURINE YELLOW 10/01/2015 2315   APPEARANCEUR CLEAR 10/01/2015 2315   LABSPEC 1.019 10/01/2015 2315   PHURINE 6.5 10/01/2015 2315   GLUCOSEU NEGATIVE 10/01/2015 2315   HGBUR NEGATIVE 10/01/2015 2315   BILIRUBINUR NEGATIVE 10/01/2015 2315   KETONESUR NEGATIVE 10/01/2015 2315   PROTEINUR NEGATIVE 10/01/2015 2315   UROBILINOGEN 1.0 05/07/2014 1838   NITRITE NEGATIVE 10/01/2015 2315   LEUKOCYTESUR NEGATIVE 10/01/2015 2315   Sepsis Labs Invalid input(s): PROCALCITONIN,  WBC,  LACTICIDVEN    Time coordinating discharge: Over 30 minutes  SIGNED:  Vernell Leep, MD,  FACP, FHM. [facility] Pager [phone]  If 7PM-7AM, please contact night-coverage [email or web address] Password [email or web address] 10/04/2015, 3:03 PM

## 2015-10-04 NOTE — Discharge Instructions (Signed)
Transient Ischemic Attack °A transient ischemic attack (TIA) is a "warning stroke" that causes stroke-like symptoms. Unlike a stroke, a TIA does not cause permanent damage to the brain. The symptoms of a TIA can happen very fast and do not last long. It is important to know the symptoms of a TIA and what to do. This can help prevent a major stroke or death. °CAUSES  °A TIA is caused by a temporary blockage in an artery in the brain or neck (carotid artery). The blockage does not allow the brain to get the blood supply it needs and can cause different symptoms. The blockage can be caused by either: °· A blood clot. °· Fatty buildup (plaque) in a neck or brain artery. °RISK FACTORS °· High blood pressure (hypertension). °· High cholesterol. °· Diabetes mellitus. °· Heart disease. °· The buildup of plaque in the blood vessels (peripheral artery disease or atherosclerosis). °· The buildup of plaque in the blood vessels that provide blood and oxygen to the brain (carotid artery stenosis). °· An abnormal heart rhythm (atrial fibrillation). °· Obesity. °· Using any tobacco products, including cigarettes, chewing tobacco, or electronic cigarettes. °· Taking oral contraceptives, especially in combination with using tobacco. °· Physical inactivity. °· A diet high in fats, salt (sodium), and calories. °· Excessive alcohol use. °· Use of illegal drugs (especially cocaine and methamphetamine). °· Being male. °· Being African American. °· Being over the age of 55 years. °· Family history of stroke. °· Previous history of blood clots, stroke, TIA, or heart attack. °· Sickle cell disease. °SIGNS AND SYMPTOMS  °TIA symptoms are the same as a stroke but are temporary. These symptoms usually develop suddenly, or may be newly present upon waking from sleep: °· Sudden weakness or numbness of the face, arm, or leg, especially on one side of the body. °· Sudden trouble walking or difficulty moving arms or legs. °· Sudden  confusion. °· Sudden personality changes. °· Trouble speaking (aphasia) or understanding. °· Difficulty swallowing. °· Sudden trouble seeing in one or both eyes. °· Double vision. °· Dizziness. °· Loss of balance or coordination. °· Sudden severe headache with no known cause. °· Trouble reading or writing. °· Loss of bowel or bladder control. °· Loss of consciousness. °DIAGNOSIS  °Your health care provider may be able to determine the presence or absence of a TIA based on your symptoms, history, and physical exam. CT scan of the brain is usually performed to help identify a TIA. Other tests may include: °· Electrocardiography (ECG). °· Continuous heart monitoring. °· Echocardiography. °· Carotid ultrasonography. °· MRI. °· A scan of the brain circulation. °· Blood tests. °TREATMENT  °Since the symptoms of TIA are the same as a stroke, it is important to seek treatment as soon as possible. You may need a medicine to dissolve a blood clot (thrombolytic) if that is the cause of the TIA. This medicine cannot be given if too much time has passed. Treatment may also include:  °· Rest, oxygen, fluids through an IV tube, and medicines to thin the blood (anticoagulants). °· Measures will be taken to prevent short-term and long-term complications, including infection from breathing foreign material into the lungs (aspiration pneumonia), blood clots in the legs, and falls. °· Procedures to either remove plaque in the carotid arteries or dilate carotid arteries that have narrowed due to plaque. Those procedures are: °¨ Carotid endarterectomy. °¨ Carotid angioplasty and stenting. °· Medicines and diet may be used to address diabetes, high blood pressure, and   other underlying risk factors. °HOME CARE INSTRUCTIONS  °· Take medicines only as directed by your health care provider. Follow the directions carefully. Medicines may be used to control risk factors for a stroke. Be sure you understand all your medicine instructions. °· You  may be told to take aspirin or the anticoagulant warfarin. Warfarin needs to be taken exactly as instructed. °¨ Taking too much or too little warfarin is dangerous. Too much warfarin increases the risk of bleeding. Too little warfarin continues to allow the risk for blood clots. While taking warfarin, you will need to have regular blood tests to measure your blood clotting time. A PT blood test measures how long it takes for blood to clot. Your PT is used to calculate another value called an INR. Your PT and INR help your health care provider to adjust your dose of warfarin. The dose can change for many reasons. It is critically important that you take warfarin exactly as prescribed. °¨ Many foods, especially foods high in vitamin K can interfere with warfarin and affect the PT and INR. Foods high in vitamin K include spinach, kale, broccoli, cabbage, collard and turnip greens, Brussels sprouts, peas, cauliflower, seaweed, and parsley, as well as beef and pork liver, green tea, and soybean oil. You should eat a consistent amount of foods high in vitamin K. Avoid major changes in your diet, or notify your health care provider before changing your diet. Arrange a visit with a dietitian to answer your questions. °¨ Many medicines can interfere with warfarin and affect the PT and INR. You must tell your health care provider about any and all medicines you take; this includes all vitamins and supplements. Be especially cautious with aspirin and anti-inflammatory medicines. Do not take or discontinue any prescribed or over-the-counter medicine except on the advice of your health care provider or pharmacist. °¨ Warfarin can have side effects, such as excessive bruising or bleeding. You will need to hold pressure over cuts for longer than usual. Your health care provider or pharmacist will discuss other potential side effects. °¨ Avoid sports or activities that may cause injury or bleeding. °¨ Be careful when shaving,  flossing your teeth, or handling sharp objects. °¨ Alcohol can change the body's ability to handle warfarin. It is best to avoid alcoholic drinks or consume only very small amounts while taking warfarin. Notify your health care provider if you change your alcohol intake. °¨ Notify your dentist or other health care providers before procedures. °· Eat a diet that includes 5 or more servings of fruits and vegetables each day. This may reduce the risk of stroke. Certain diets may be prescribed to address high blood pressure, high cholesterol, diabetes, or obesity. °¨ A diet low in sodium, saturated fat, trans fat, and cholesterol is recommended to manage high blood pressure. °¨ A diet low in saturated fat, trans fat, and cholesterol, and high in fiber may control cholesterol levels. °¨ A controlled-carbohydrate, controlled-sugar diet is recommended to manage diabetes. °¨ A reduced-calorie diet that is low in sodium, saturated fat, trans fat, and cholesterol is recommended to manage obesity. °· Maintain a healthy weight. °· Stay physically active. It is recommended that you get at least 30 minutes of activity on most or all days. °· Do not use any tobacco products, including cigarettes, chewing tobacco, or electronic cigarettes. If you need help quitting, ask your health care provider. °· Limit alcohol intake to no more than 1 drink per day for nonpregnant women and 2 drinks   per day for men. One drink equals 12 ounces of beer, 5 ounces of wine, or 1½ ounces of hard liquor. °· Do not abuse drugs. °· A safe home environment is important to reduce the risk of falls. Your health care provider may arrange for specialists to evaluate your home. Having grab bars in the bedroom and bathroom is often important. Your health care provider may arrange for equipment to be used at home, such as raised toilets and a seat for the shower. °· Follow all instructions for follow-up with your health care provider. This is very important.  This includes any referrals and lab tests. Proper follow-up can prevent a stroke or another TIA from occurring. °PREVENTION  °The risk of a TIA can be decreased by appropriately treating high blood pressure, high cholesterol, diabetes, heart disease, and obesity, and by quitting smoking, limiting alcohol, and staying physically active. °SEEK MEDICAL CARE IF: °· You have personality changes. °· You have difficulty swallowing. °· You are seeing double. °· You have dizziness. °· You have a fever. °SEEK IMMEDIATE MEDICAL CARE IF:  °Any of the following symptoms may represent a serious problem that is an emergency. Do not wait to see if the symptoms will go away. Get medical help right away. Call your local emergency services (911 in [place] Do not drive yourself to the hospital. °· You have sudden weakness or numbness of the face, arm, or leg, especially on one side of the body. °· You have sudden trouble walking or difficulty moving arms or legs. °· You have sudden confusion. °· You have trouble speaking (aphasia) or understanding. °· You have sudden trouble seeing in one or both eyes. °· You have a loss of balance or coordination. °· You have a sudden, severe headache with no known cause. °· You have new chest pain or an irregular heartbeat. °· You have a partial or total loss of consciousness. °MAKE SURE YOU:  °· Understand these instructions. °· Will watch your condition. °· Will get help right away if you are not doing well or get worse. °  °This information is not intended to replace advice given to you by your health care provider. Make sure you discuss any questions you have with your health care provider. °  °Document Released: 12/27/2004 Document Revised: 04/09/2014 Document Reviewed: 06/24/2013 °Elsevier Interactive Patient Education ©2016 [employer] ° °

## 2015-10-04 NOTE — Progress Notes (Signed)
Occupational Therapy Treatment Patient Details Name: Theodore Mendoza MRN: [id number] DOB: 1941-03-04 Today's Date: 10/04/2015    History of present illness HPI: Theodore Mendoza is an 75 y.o. male with a history of hypertension, hyperlipidemia, myocardial infarction and Guillain-Barr syndrome who experienced transient side weakness and slurred speech and was taken to [facility]. He has no previous history of stroke nor TIA. He's been taking aspirin 81 mg per day. He was last known well at 5:45 PM. Deficits were noted when he attempted to get out of his chair at 7:00 PM. Patient has residual severe weakness of both lower extremities as well as distal upper extremities from Guillain-Barr syndrome in 1987. He was not aware of any worsening of leg weakness. Deficits improved in route to the hospital and subsequently resolved. He has residual left facial weakness from GBS which is unchanged   OT comments  Pt is at adequate level for family to (A) at home level. Pt able to direct care this session and (A) with LB care. Pt educated to take prafo home for home use as well. Pt very pleasant and eager to go home. Pt dressed for home at this time.    Follow Up Recommendations  Home health OT;Supervision/Assistance - 24 hour;Other (comment)    Equipment Recommendations  None recommended by OT    Recommendations for Other Services      Precautions / Restrictions Precautions Precautions: None Restrictions Weight Bearing Restrictions: No       Mobility Bed Mobility Overal bed mobility: Needs Assistance Bed Mobility: Supine to Sit     Supine to sit: Supervision     General bed mobility comments: Supervision for safety; used rails  Transfers Overall transfer level: Needs assistance Equipment used: Rolling walker (2 wheeled) Transfers: Sit to/from Stand Sit to Stand: Min assist;+2 physical assistance Stand pivot transfers: Min assist       General transfer comment: mod (A) to pwer  up from low surface. pt asking therapist to block RW due to wood floor vs carpet floors at home. pt with past fall in garage and now with fear of floor surfaces    Balance Overall balance assessment: Needs assistance   Sitting balance-Leahy Scale: Good Sitting balance - Comments: tends to lean against the rail or use hands for support until shoes are on then pt relaxes a bit.     Standing balance-Leahy Scale: Fair Standing balance comment: heavy leaning on RW and normally transfers to chair                   ADL Overall ADL's : Needs assistance/impaired                     Lower Body Dressing: Moderate assistance;Sit to/from stand Lower Body Dressing Details (indicate cue type and reason): pt requires (A) to thread underwear, pants and don bil shoes. pt reports sitting on a lower surface at home and being able to put on shoes. wife normally helps iwth LB dressing at baseline. Pt able to hook straps on bil braces LE. Pt and wife report that his is close to baseline Toilet Transfer: Moderate assistance;RW;BSC Toilet Transfer Details (indicate cue type and reason): pulling on RW and needs (A) to hold RW in place. Pt with anterior weight shift and then power up with trunk         Functional mobility during ADLs: Minimal assistance;Rolling walker;+2 for physical assistance General ADL Comments: Pt completed bed mobility with bed  rail and then transferred to chair. Pt will have family (A) at home and pending d/c today.       Vision                     Perception     Praxis      Cognition   Behavior During Therapy: WFL for tasks assessed/performed Overall Cognitive Status: Within Functional Limits for tasks assessed                       Extremity/Trunk Assessment               Exercises     Shoulder Instructions       General Comments      Pertinent Vitals/ Pain       Pain Assessment: No/denies pain  Home Living                                           Prior Functioning/Environment              Frequency Min 2X/week     Progress Toward Goals  OT Goals(current goals can now be found in the care plan section)  Progress towards OT goals: Progressing toward goals  Acute Rehab OT Goals Patient Stated Goal: Wants to be home today OT Goal Formulation: With patient/family Time For Goal Achievement: 10/16/15 Potential to Achieve Goals: Good ADL Goals Pt Will Perform Lower Body Bathing: with modified independence;sitting/lateral leans Pt Will Perform Lower Body Dressing: with modified independence;sitting/lateral leans Pt Will Transfer to Toilet: with modified independence;stand pivot transfer;bedside commode Pt Will Perform Toileting - Clothing Manipulation and hygiene: with modified independence;sitting/lateral leans Pt Will Perform Tub/Shower Transfer: Tub transfer;with modified independence;Stand pivot transfer;tub bench  Plan Discharge plan remains appropriate    Co-evaluation    PT/OT/SLP Co-Evaluation/Treatment: Yes Reason for Co-Treatment: Complexity of the patient's impairments (multi-system involvement);For patient/therapist safety PT goals addressed during session: Mobility/safety with mobility OT goals addressed during session: ADL's and self-care;Strengthening/ROM      End of Session Equipment Utilized During Treatment: Gait belt;Rolling walker;Other (comment) (BIL shoes with splints)   Activity Tolerance Patient tolerated treatment well   Patient Left in chair;with call bell/phone within reach;with chair alarm set;with family/visitor present   Nurse Communication Mobility status    Functional Assessment Tool Used: clinical judgement Functional Limitation: Self care Self Care Current Status [id number]): At least 20 percent but less than 40 percent impaired, limited or restricted Self Care Goal Status [id number]): At least 20 percent but less than 40 percent impaired, limited or  restricted   Time: 1227-1252 OT Time Calculation (min): 25 min  Charges: OT G-codes **NOT FOR INPATIENT CLASS** Functional Assessment Tool Used: clinical judgement Functional Limitation: Self care Self Care Current Status [id number]): At least 20 percent but less than 40 percent impaired, limited or restricted Self Care Goal Status [id number]): At least 20 percent but less than 40 percent impaired, limited or restricted OT General Charges $OT Visit: 1 Procedure OT Treatments $Self Care/Home Management : 8-22 mins  Parke Poisson B 10/04/2015, 2:11 PM   Jeri Modena   OTR/L Pager: [phone] Office: [phone] .

## 2015-10-04 NOTE — Progress Notes (Signed)
Physical Therapy Treatment Patient Details Name: Theodore Mendoza MRN: [id number] DOB: 1940-05-24 Today's Date: 10/04/2015    History of Present Illness HPI: Theodore Mendoza is an 75 y.o. male with a history of hypertension, hyperlipidemia, myocardial infarction and Guillain-Barr syndrome who experienced transient side weakness and slurred speech and was taken to [facility]. He has no previous history of stroke nor TIA. He's been taking aspirin 81 mg per day. He was last known well at 5:45 PM. Deficits were noted when he attempted to get out of his chair at 7:00 PM. Patient has residual severe weakness of both lower extremities as well as distal upper extremities from Guillain-Barr syndrome in 1987. He was not aware of any worsening of leg weakness. Deficits improved in route to the hospital and subsequently resolved. He has residual left facial weakness from GBS which is unchanged    PT Comments    Pt and wife feel comfortable that they will be able to manage well a pt's present level of mobility and function.  Follow Up Recommendations  Home health PT;Supervision/Assistance - 24 hour     Equipment Recommendations  None recommended by PT    Recommendations for Other Services       Precautions / Restrictions Precautions Precautions: None Restrictions Weight Bearing Restrictions: No    Mobility  Bed Mobility Overal bed mobility: Needs Assistance Bed Mobility: Supine to Sit     Supine to sit: Supervision     General bed mobility comments: Supervision for safety; used rails  Transfers Overall transfer level: Needs assistance Equipment used: Rolling walker (2 wheeled) Transfers: Sit to/from [employer] Transfers Sit to Stand: +2 safety/equipment;Mod assist Stand pivot transfers: Min assist       General transfer comment: mod to power up from lower surface, which should be solved by using his lift chair.  Ambulation/Gait     Assistive device: Rolling walker  (2 wheeled)       General Gait Details: In middle of treatment found out that pt was leaving immenently, so did not walk to save his energy.   Stairs            Wheelchair Mobility    Modified Rankin (Stroke Patients Only) Modified Rankin (Stroke Patients Only) Pre-Morbid Rankin Score: Moderately severe disability Modified Rankin: Moderately severe disability     Balance Overall balance assessment: Needs assistance   Sitting balance-Leahy Scale: Fair Sitting balance - Comments: tends to lean against the rail or use hands for support until shoes are on then pt relaxes a bit.     Standing balance-Leahy Scale: Poor Standing balance comment: heavy use of UE's on the RW                    Cognition Arousal/Alertness: Awake/alert Behavior During Therapy: WFL for tasks assessed/performed Overall Cognitive Status: Within Functional Limits for tasks assessed                      Exercises      General Comments General comments (skin integrity, edema, etc.): PT/OT and pt spent time in sitting and standing donning AFO's, under clothes and pants in prep for d/c with pt assisting as able, generally with minimal assist.      Pertinent Vitals/Pain Pain Assessment: No/denies pain    Home Living                      Prior Function  PT Goals (current goals can now be found in the care plan section) Acute Rehab PT Goals Patient Stated Goal: Wants to be home today PT Goal Formulation: With patient Time For Goal Achievement: 10/16/15 Potential to Achieve Goals: Good Progress towards PT goals: Progressing toward goals    Frequency       PT Plan Current plan remains appropriate    Co-evaluation   Reason for Co-Treatment: For patient/therapist safety PT goals addressed during session: Mobility/safety with mobility       End of Session   Activity Tolerance: Patient tolerated treatment well Patient left: in chair;with call  bell/phone within reach;with family/visitor present     Time: 1227-1252 PT Time Calculation (min) (ACUTE ONLY): 25 min  Charges:  $Therapeutic Activity: 8-22 mins                    G Codes:  Mobility: Walking and Moving Around Goal Status [id number]): At least 20 percent but less than 40 percent impaired, limited or restricted (needs the adaptive equipment to attain CI)   Amanda Pote, Tessie Fass 10/04/2015, 1:37 PM 10/04/2015  Donnella Sham, PT [phone]  (pager)

## 2015-10-04 NOTE — Progress Notes (Signed)
Patient is being dc home. Dc instructions given and patient and caregivers verbalized understanding.

## 2015-10-05 NOTE — Progress Notes (Signed)
STROKE TEAM PROGRESS NOTE   SUBJECTIVE (INTERVAL HISTORY) His family are at the bedside. EEG no seizure. No acute issue overnight. Pt is eager to go home.     OBJECTIVE Temp:  [97.7 F (36.5 C)-98.4 F (36.9 C)] 98.2 F (36.8 C) (07/04 1418) Pulse Rate:  [56-62] 56 (07/04 1418) Cardiac Rhythm:  [-] Sinus bradycardia (07/04 0827) Resp:  [16-18] 18 (07/04 1418) BP: (144-168)/(60-97) 144/68 mmHg (07/04 1418) SpO2:  [96 %-98 %] 96 % (07/04 1418)  CBC:   Recent Labs Lab 10/01/15 2053 10/01/15 2101  WBC 8.3  --   NEUTROABS 4.9  --   HGB 13.3 13.6  HCT 39.5 40.0  MCV 91.6  --   PLT 206  --     Basic Metabolic Panel:   Recent Labs Lab 10/01/15 2053 10/01/15 2101  NA 140 141  K 4.0 4.3  CL 107 103  CO2 27  --   GLUCOSE 149* 148*  BUN 21* 24*  CREATININE 0.70 0.70  CALCIUM 8.9  --     Lipid Panel:     Component Value Date/Time   CHOL 118 10/02/2015 0723   TRIG 39 10/02/2015 0723   HDL 37* 10/02/2015 0723   CHOLHDL 3.2 10/02/2015 0723   VLDL 8 10/02/2015 0723   LDLCALC 73 10/02/2015 0723   HgbA1c:  Lab Results  Component Value Date   HGBA1C 5.3 10/02/2015   Urine Drug Screen:     Component Value Date/Time   LABOPIA NONE DETECTED 10/01/2015 2315   COCAINSCRNUR NONE DETECTED 10/01/2015 2315   LABBENZ NONE DETECTED 10/01/2015 2315   AMPHETMU NONE DETECTED 10/01/2015 2315   THCU NONE DETECTED 10/01/2015 2315   LABBARB NONE DETECTED 10/01/2015 2315      IMAGING I have personally reviewed the radiological images below and agree with the radiology interpretations.  Dg Chest 2 View 10/02/2015   No active cardiopulmonary disease.   MRI HEAD  10/02/2015  No acute infarct. Moderate chronic microvascular changes. Enlarging left lateral posterior fossa extra-axial mass consistent with meningioma now measuring 2.8 x 3.4 x 3.4 cm versus 2.2 x 3.1 x 3 cm on 2012 exam. Extension through or superior displacement of the left tentorium impressing upon the  undersurface of the posterior temporal lobe. Compression of the left cerebellum. No associated vasogenic edema. Meningioma narrows the left transverse sinus/sigmoid sinus which may be partially occluded. MR venography or CT venography can be performed for further delineation if clinically desired. Dominant dural drainage is to the right. Mild to moderate global atrophy without hydrocephalus. Small focal region of blood breakdown products posterior left subinsular region unchanged from 2012 and may represent result of prior hemorrhagic ischemia versus small cavernoma. Mild to slightly moderate maxillary sinus mucosal thickening with polypoid opacification.   MRA HEAD  10/02/2015  Mild narrowing left internal carotid artery cavernous segment with adjacent mild ectasia. Mild irregularity without significant narrowing M1 segment middle cerebral artery bilaterally. Middle cerebral artery branch vessel mild narrowing and irregularity. Mild irregularity distal vertebral arteries and basilar artery without significant narrowing. Right posterior inferior cerebellar artery not visualized. Mild to moderate narrowing portions of the superior cerebellar arteries and posterior cerebral arteries bilaterally.   Ct Head Code Stroke W/o Cm 10/01/2015  Left cerebellopontine angle mass, very slowly enlarging from 2012 although there is little change from 05/07/2014.  Chronic white matter hypodensities consistent with chronic small vessel ischemic disease.  Remote left basal ganglia infarctions.  No acute findings.   CUS - Bilateral: 1-39% ICA  stenosis. Vertebral artery flow is antegrade.   Transthoracic echocardiogram 10/02/2015 - Left ventricle: The cavity size was normal. There was moderate focal basal hypertrophy of the septum. Systolic function was normal. The estimated ejection fraction was in the range of 55% to 60%. Wall motion was normal; there were no regional wall motion abnormalities. Doppler parameters  are consistent with abnormal left ventricular relaxation (grade 1 diastolic dysfunction). Impressions: Definity used; normal LV systolic function; grade 1 diastolic dysfunction.  EEG This EEG is notable for focal intermixed slowing in the left temporoparietal region, suggesting regional cortical dysfunction in the area. There is nothing to suggest seizure on this study.     PHYSICAL EXAM Frail elderly caucasian male not in distress. . Afebrile. Head is nontraumatic. Neck is supple without bruit.    Cardiac exam no murmur or gallop. Lungs are clear to auscultation. Distal pulses are well felt. Bilateral hand wasting and flexion contractures with bilateral foot braces. Neurological Exam :  Mental Status: Alert, oriented, thought content appropriate. Speech fluent without evidence of aphasia. Able to follow commands without difficulty. Cranial Nerves: II-Visual fields were normal. III/IV/VI-Pupils were equal and reacted normally to light. Extraocular movements were full and conjugate.  V/VII-no facial numbness; mild left facial weakness, lower greater than upper facial weakness. VIII-normal. X-normal speech and symmetrical palatal movement. XI: trapezius strength/neck flexion strength normal bilaterally XII-midline tongue extension with normal strength. Motor: Normal strength proximally of upper extremities; atrophy and weakness of intrinsic hand muscles bilaterally; moderately severe weakness proximally of lower extremities and no intact voluntary movement of feet. Sensory: Normal throughout. B/l ankle braces severe wasting intrinsic hand and foot muscles with flexion contractures of distal fingers bilaterally Deep Tendon Reflexes: Absent throughout. Plantars: Mute bilaterally Cerebellar: Slightly impaired coordination of upper extremities, commensurate with severe weakness and atrophy of  Muscles. Gait : deferred   ASSESSMENT/PLAN Mr. CHINO FELTZ is a 75 y.o. male with history of  hypertension, hyperlipidemia, coronary artery disease with previous MI, Ethelene Hal syndrome, meningioma, and previous stroke by head CT presenting with speech difficulties and right-sided weakness.[name] He did not receive IV t-PA due to improving deficits.  Possible TIA, but seizure can not be completely ruled out  Resultant - deficits resolved.  MRI  - no acute infarct. Enlarging meningioma - followed with NSG in [facility].  MRA - unremarkable  Carotid Doppler unremarkable  2D Echo - EF 55-60%. No cardiac source of emboli identified.  EEG no seizure - left temporoparietal focal slowing likely due to meningioma - no AED needed at this time.  LDL 73  HgbA1c 5.3  VTE prophylaxis - SCDs Diet - low sodium heart healthy  aspirin 81 mg daily prior to admission, now on clopidogrel 75 mg daily  Patient counseled to be compliant with his antithrombotic medications  Ongoing aggressive stroke risk factor management  Therapy recommendations: Home health physical therapy recommended.  Disposition: Home with HH  Follow-up with Dr. Leonie Man in 2 months.  Left post fossa meningioma  Enlarging comparing with 2012  Compressing inferior left temporal lobe  EEG no seizure, left temporoparietal focal slowing likely due to meningioma.  No need for AED at this time  Following with [facility]G in [facility]  Hypertension  Blood pressures running somewhat high  Permissive hypertension (OK if < 220/120) but gradually normalize in 5-7 days  Long-term BP goal normotensive  Hyperlipidemia  Home meds:  Pravachol 40 mg daily resumed in hospital  LDL 73, goal < 70  Continue statin at discharge  Other Stroke Risk Factors  Advanced age  Hx stroke/TIA  Coronary artery disease  Other Active Problems  Hx GBS 30 years ago with residue right facial drop and b/l UE<LE weakness.   Hospital day # 1  Neurology will sign off. Please call with questions. Pt will follow up with Dr. Erlinda Hong at [facility] in about 2  months. Thanks for the consult.   Rosalin Hawking, MD PhD Stroke Neurology 10/05/2015 12:01 AM     To contact Stroke Continuity provider, please refer to [email or web address] After hours, contact General Neurology

## 2015-10-23 ENCOUNTER — Encounter (HOSPITAL_COMMUNITY): Payer: Self-pay | Admitting: Emergency Medicine

## 2015-10-23 ENCOUNTER — Emergency Department (HOSPITAL_COMMUNITY)
Admission: EM | Admit: 2015-10-23 | Discharge: 2015-10-23 | Disposition: A | Discharge status: Home / Self Care | Payer: Medicare Other | Attending: Emergency Medicine | Admitting: Emergency Medicine

## 2015-10-23 DIAGNOSIS — N390 Urinary tract infection, site not specified: Secondary | ICD-10-CM | POA: Insufficient documentation

## 2015-10-23 DIAGNOSIS — Z7982 Long term (current) use of aspirin: Secondary | ICD-10-CM | POA: Insufficient documentation

## 2015-10-23 DIAGNOSIS — I10 Essential (primary) hypertension: Secondary | ICD-10-CM | POA: Insufficient documentation

## 2015-10-23 DIAGNOSIS — Z7902 Long term (current) use of antithrombotics/antiplatelets: Secondary | ICD-10-CM | POA: Insufficient documentation

## 2015-10-23 DIAGNOSIS — I252 Old myocardial infarction: Secondary | ICD-10-CM | POA: Diagnosis not present

## 2015-10-23 DIAGNOSIS — Z79899 Other long term (current) drug therapy: Secondary | ICD-10-CM | POA: Diagnosis not present

## 2015-10-23 DIAGNOSIS — Z9861 Coronary angioplasty status: Secondary | ICD-10-CM | POA: Diagnosis not present

## 2015-10-23 DIAGNOSIS — Z960 Presence of urogenital implants: Secondary | ICD-10-CM | POA: Diagnosis not present

## 2015-10-23 DIAGNOSIS — R339 Retention of urine, unspecified: Secondary | ICD-10-CM | POA: Diagnosis present

## 2015-10-23 HISTORY — DX: Cerebral infarction, unspecified: I63.9

## 2015-10-23 LAB — URINE MICROSCOPIC-ADD ON: Squamous Epithelial / LPF: NONE SEEN

## 2015-10-23 LAB — URINALYSIS, ROUTINE W REFLEX MICROSCOPIC
Glucose, UA: NEGATIVE mg/dL
Ketones, ur: NEGATIVE mg/dL
NITRITE: POSITIVE — AB
PH: 7 (ref 5.0–8.0)
Protein, ur: 100 mg/dL — AB
SPECIFIC GRAVITY, URINE: 1.023 (ref 1.005–1.030)

## 2015-10-23 MED ORDER — CEPHALEXIN 500 MG PO CAPS: 500.0000 mg | ORAL_CAPSULE | Freq: Four times a day (QID) | ORAL | 0 refills | Status: DC

## 2015-10-23 MED ORDER — DEXTROSE 5 % IV SOLN
1.0000 g | Freq: Once | INTRAVENOUS | Status: AC
  Administered 2015-10-23: 1 g via INTRAVENOUS
  Filled 2015-10-23: qty 10

## 2015-10-23 NOTE — ED Provider Notes (Signed)
[facility] DEPT Provider Note   CSN: [id number] Arrival date & time: 10/23/15  0607  First Provider Contact:  None       History   Chief Complaint Chief Complaint  Patient presents with  . Urinary Retention    HPI Theodore Mendoza is a 75 y.o. male.  HPI Patient presents to emergency room with chief complaint of dark smelling urine along with urinary frequency.  Patient's had urinary infections in the past.  Feels like he is not emptying his bladder.  No severe pain.  He did sweat last night and may have had a low-grade fever.  He did not take his temperature at home.  Denies nausea vomiting.  Has also had a headache for 3 days. Past Medical History:  Diagnosis Date  . Guillain-Barre syndrome ([place]) Feb 13 1986  . Hypertension   . Myocardial infarction ([place]) 1997  . Stroke [facility])     Patient Active Problem List   Diagnosis Date Noted  . TIA (transient ischemic attack) 10/01/2015  . Hyperlipidemia 05/08/2014  . Sepsis ([place]) 05/07/2014  . Pyelonephritis 05/07/2014  . Essential (primary) hypertension 05/07/2014  . AKI (acute kidney injury) ([place]) 05/07/2014  . Hydronephrosis with urinary obstruction due to renal calculus   . Blood poisoning ([place])     Past Surgical History:  Procedure Laterality Date  . CORONARY ANGIOPLASTY  1997   after mi  . CYSTOSCOPY WITH RETROGRADE PYELOGRAM, URETEROSCOPY AND STENT PLACEMENT Left 06/28/2014   Procedure: 1ST STAGE CYSTOSCOPY/URETEROSCOPY/STENT PLACEMENT;  Surgeon: Alexis Frock, MD;  Location: [facility] ORS;  Service: Urology;  Laterality: Left;  . CYSTOSCOPY WITH STENT PLACEMENT Left 05/08/2014   Procedure: CYSTOSCOPY, RETROGRADE PYELOGRAM WITH LEFT URETERAL STENT PLACEMENT;  Surgeon: Alexis Frock, MD;  Location: [facility] ORS;  Service: Urology;  Laterality: Left;  . EYE SURGERY Right may 2015   growth removed, july 2015left eye cataract removed, right eye catarct removed  . gamma kniferadiation treatment  Feb 09 2014   [facility]    . HOLMIUM LASER APPLICATION Left [id number]   Procedure: HOLMIUM LASER APPLICATION;  Surgeon: Alexis Frock, MD;  Location: [facility] ORS;  Service: Urology;  Laterality: Left;  . LITHOTRIPSY  years ago  . STONE EXTRACTION WITH BASKET Left 06/28/2014   Procedure: STONE EXTRACTION WITH BASKET;  Surgeon: Alexis Frock, MD;  Location: [facility];  Service: Urology;  Laterality: Left;       Home Medications    Prior to Admission medications   Medication Sig Start Date End Date Taking? Authorizing Provider  amLODipine (NORVASC) 2.5 MG tablet Take 2.5 mg by mouth every morning.    Yes Historical Provider, MD  aspirin EC 81 MG tablet Take 81 mg by mouth daily.   Yes Historical Provider, MD  clopidogrel (PLAVIX) 75 MG tablet Take 1 tablet (75 mg total) by mouth daily. 10/04/15  Yes Modena Jansky, MD  escitalopram (LEXAPRO) 10 MG tablet Take 10 mg by mouth at bedtime.    Yes Historical Provider, MD  gabapentin (NEURONTIN) 300 MG capsule Take 300 mg by mouth 3 (three) times daily.   Yes Historical Provider, MD  Ginseng (GIN-ZING) 100 MG CAPS Take 100 mg by mouth daily.   Yes Historical Provider, MD  lisinopril (PRINIVIL,ZESTRIL) 5 MG tablet Take 5 mg by mouth daily. 10/11/15  Yes Historical Provider, MD  metoprolol tartrate (LOPRESSOR) 25 MG tablet Take 25 mg by mouth every morning.    Yes Historical Provider, MD  naproxen sodium (ANAPROX) 220 MG tablet  Take 220 mg by mouth 2 (two) times daily as needed (headache.).   Yes Historical Provider, MD  pravastatin (PRAVACHOL) 40 MG tablet Take 40 mg by mouth daily with lunch.   Yes Historical Provider, MD    Family History Family History  Problem Relation Age of Onset  . Diabetes Mother   . Lung cancer Mother   . CAD Father   . Diabetes Brother   . Stroke Neg Hx     Social History Social History  Substance Use Topics  . Smoking status: Never Smoker  . Smokeless tobacco: Never Used  . Alcohol use No     Allergies   Hydrocodone-acetaminophen;  Morphine and related; and Valsartan   Review of Systems Review of Systems All other systems reviewed and are negative  Physical Exam Updated Vital Signs BP 128/60 (BP Location: Right Arm)   Pulse (!) 54   Temp 98.1 F (36.7 C) (Oral)   Resp 16   Ht 5\' 8"  (1.727 m)   Wt 173 lb (78.5 kg)   SpO2 95%   BMI 26.30 kg/m   Physical Exam Physical Exam  Nursing note and vitals reviewed. Constitutional: He is oriented to person, place, and time. He appears well-developed and well-nourished. No distress.  HENT:  Head: Normocephalic and atraumatic.  Eyes: Pupils are equal, round, and reactive to light.  Neck: Normal range of motion.  Cardiovascular: Normal rate and intact distal pulses.   Pulmonary/Chest: No respiratory distress.  Abdominal: Normal appearance. He exhibits no distension.  Slight suprapubic tenderness over the bladder area.  No rebound or guarding tenderness.   Musculoskeletal: Normal range of motion.  Neurological: He is alert and oriented to person, place, and time. No cranial nerve deficit.  Skin: Skin is warm and dry. No rash noted.  Psychiatric: He has a normal mood and affect. His behavior is normal.    ED Treatments / Results  Labs (all labs ordered are listed, but only abnormal results are displayed) Labs Reviewed  URINALYSIS, ROUTINE W REFLEX MICROSCOPIC (NOT AT [facility]) - Abnormal; Notable for the following:       Result Value   Color, Urine RED (*)    APPearance TURBID (*)    Hgb urine dipstick LARGE (*)    Bilirubin Urine SMALL (*)    Protein, ur 100 (*)    Nitrite POSITIVE (*)    Leukocytes, UA LARGE (*)    All other components within normal limits  URINE MICROSCOPIC-ADD ON - Abnormal; Notable for the following:    Bacteria, UA MANY (*)    All other components within normal limits  URINE CULTURE    EKG  EKG Interpretation None       Radiology No results found.  Procedures Procedures (including critical care time)   Medications Ordered  in ED Medications  cefTRIAXone (ROCEPHIN) 1 g in dextrose 5 % 50 mL IVPB (1 g Intravenous New Bag/Given 10/23/15 1112)      Initial Impression / Assessment and Plan / ED Course  I have reviewed the triage vital signs and the nursing notes.  Pertinent labs & imaging results that were available during my care of the patient were reviewed by me and considered in my medical decision making (see chart for details).  Clinical Course      Final Clinical Impressions(s) / ED Diagnoses   Final diagnoses:  UTI (lower urinary tract infection)    New Prescriptions New Prescriptions   No medications on file     Herbie Baltimore  Audie Pinto, MD 10/23/15 1135

## 2015-10-23 NOTE — ED Notes (Signed)
Patient voided 30 ml. Bladder scan showed 60 ml after voiding. In and out cath performed with return of 30 ml blood -tinged urine.

## 2015-10-23 NOTE — ED Triage Notes (Signed)
Pt c/o urinary retention, urgency, and dysuria x 3 days. Pt c/o HA x 3 days.  Pt states he became very diaphoretic last night in bed, no known fever at home

## 2015-10-25 LAB — URINE CULTURE: Culture: >=100000 — AB

## 2015-10-26 ENCOUNTER — Telehealth (HOSPITAL_BASED_OUTPATIENT_CLINIC_OR_DEPARTMENT_OTHER): Payer: Self-pay | Admitting: *Deleted

## 2015-10-26 NOTE — Telephone Encounter (Signed)
Urine culture positive for proteus mirabilis treated with cephalexin same sensitive no further treatment and follow up. Okayed by Griffith Citron PharmD

## 2015-11-10 ENCOUNTER — Emergency Department (HOSPITAL_COMMUNITY)
Admission: EM | Admit: 2015-11-10 | Discharge: 2015-11-10 | Disposition: A | Discharge status: Home / Self Care | Payer: Medicare Other | Attending: Emergency Medicine | Admitting: Emergency Medicine

## 2015-11-10 ENCOUNTER — Encounter (HOSPITAL_COMMUNITY): Payer: Self-pay | Admitting: Emergency Medicine

## 2015-11-10 ENCOUNTER — Emergency Department (HOSPITAL_COMMUNITY): Payer: Medicare Other

## 2015-11-10 DIAGNOSIS — R109 Unspecified abdominal pain: Secondary | ICD-10-CM | POA: Diagnosis present

## 2015-11-10 DIAGNOSIS — Z7982 Long term (current) use of aspirin: Secondary | ICD-10-CM | POA: Diagnosis not present

## 2015-11-10 DIAGNOSIS — N3090 Cystitis, unspecified without hematuria: Secondary | ICD-10-CM | POA: Diagnosis not present

## 2015-11-10 DIAGNOSIS — Z79899 Other long term (current) drug therapy: Secondary | ICD-10-CM | POA: Insufficient documentation

## 2015-11-10 DIAGNOSIS — I10 Essential (primary) hypertension: Secondary | ICD-10-CM | POA: Diagnosis not present

## 2015-11-10 LAB — URINALYSIS, ROUTINE W REFLEX MICROSCOPIC
Bilirubin Urine: NEGATIVE
GLUCOSE, UA: NEGATIVE mg/dL
Hgb urine dipstick: NEGATIVE
Ketones, ur: NEGATIVE mg/dL
Nitrite: POSITIVE — AB
PH: 8.5 — AB (ref 5.0–8.0)
Protein, ur: NEGATIVE mg/dL
Specific Gravity, Urine: 1.017 (ref 1.005–1.030)

## 2015-11-10 LAB — CBC WITH DIFFERENTIAL/PLATELET
Basophils Absolute: 0 10*3/uL (ref 0.0–0.1)
Basophils Relative: 0 %
Eosinophils Absolute: 0.4 10*3/uL (ref 0.0–0.7)
Eosinophils Relative: 5 %
HCT: 40.4 % (ref 39.0–52.0)
Hemoglobin: 13.6 g/dL (ref 13.0–17.0)
Lymphocytes Relative: 20 %
Lymphs Abs: 1.8 10*3/uL (ref 0.7–4.0)
MCH: 31.3 pg (ref 26.0–34.0)
MCHC: 33.7 g/dL (ref 30.0–36.0)
MCV: 92.9 fL (ref 78.0–100.0)
Monocytes Absolute: 0.5 10*3/uL (ref 0.1–1.0)
Monocytes Relative: 6 %
Neutro Abs: 6.3 10*3/uL (ref 1.7–7.7)
Neutrophils Relative %: 69 %
Platelets: 203 10*3/uL (ref 150–400)
RBC: 4.35 MIL/uL (ref 4.22–5.81)
RDW: 13.9 % (ref 11.5–15.5)
WBC: 9.1 10*3/uL (ref 4.0–10.5)

## 2015-11-10 LAB — BASIC METABOLIC PANEL
Anion gap: 7 (ref 5–15)
BUN: 22 mg/dL — ABNORMAL HIGH (ref 6–20)
CO2: 28 mmol/L (ref 22–32)
Calcium: 9 mg/dL (ref 8.9–10.3)
Chloride: 108 mmol/L (ref 101–111)
Creatinine, Ser: 0.65 mg/dL (ref 0.61–1.24)
GFR calc Af Amer: >60 mL/min (ref >60)
GFR calc non Af Amer: >60 mL/min (ref >60)
Glucose, Bld: 102 mg/dL — ABNORMAL HIGH (ref 65–99)
Potassium: 4.5 mmol/L (ref 3.5–5.1)
Sodium: 143 mmol/L (ref 135–145)

## 2015-11-10 LAB — URINE MICROSCOPIC-ADD ON
RBC / HPF: NONE SEEN RBC/hpf (ref 0–5)
Squamous Epithelial / LPF: NONE SEEN

## 2015-11-10 MED ORDER — DEXTROSE 5 % IV SOLN
1.0000 g | Freq: Once | INTRAVENOUS | Status: AC
  Administered 2015-11-10: 1 g via INTRAVENOUS
  Filled 2015-11-10: qty 10

## 2015-11-10 MED ORDER — CIPROFLOXACIN HCL 500 MG PO TABS: 500.0000 mg | ORAL_TABLET | Freq: Two times a day (BID) | ORAL | 0 refills | Status: DC

## 2015-11-10 NOTE — ED Provider Notes (Signed)
[facility] DEPT Provider Note   CSN: [id number] Arrival date & time: 11/10/15  1401  First Provider Contact:  None       History   Chief Complaint Chief Complaint  Patient presents with  . Flank Pain    HPI Theodore Mendoza is a 75 y.o. male.  HPI   75 year old male patient presents today with left back and flank pain. Patient reports symptoms started 3 days ago on his left mid back radiating around to his flank. He reports symptoms are worse when getting up out of his chair, denies any pain at rest. Patient notes that he's also had very foul-smelling urine, dysuria since then. He denies any fever or chills nausea or vomiting, reports eating and drinking well. Denies any abdominal pain, lower extremity loss of sensation strength or motor function.   Past Medical History:  Diagnosis Date  . Guillain-Barre syndrome ([place]) Feb 13 1986  . Hypertension   . Myocardial infarction ([place]) 1997  . Stroke [facility])     Patient Active Problem List   Diagnosis Date Noted  . TIA (transient ischemic attack) 10/01/2015  . Hyperlipidemia 05/08/2014  . Sepsis ([place]) 05/07/2014  . Pyelonephritis 05/07/2014  . Essential (primary) hypertension 05/07/2014  . AKI (acute kidney injury) ([place]) 05/07/2014  . Hydronephrosis with urinary obstruction due to renal calculus   . Blood poisoning ([place])     Past Surgical History:  Procedure Laterality Date  . CORONARY ANGIOPLASTY  1997   after mi  . CYSTOSCOPY WITH RETROGRADE PYELOGRAM, URETEROSCOPY AND STENT PLACEMENT Left 06/28/2014   Procedure: 1ST STAGE CYSTOSCOPY/URETEROSCOPY/STENT PLACEMENT;  Surgeon: Alexis Frock, MD;  Location: [facility] OR[facility];  Service: Urology;  Laterality: Left;  . CYSTOSCOPY WITH STENT PLACEMENT Left 05/08/2014   Procedure: CYSTOSCOPY, RETROGRADE PYELOGRAM WITH LEFT URETERAL STENT PLACEMENT;  Surgeon: Alexis Frock, MD;  Location: [facility];  Service: Urology;  Laterality: Left;  . EYE SURGERY Right may 2015   growth removed, july  2015left eye cataract removed, right eye catarct removed  . gamma kniferadiation treatment  Feb 09 2014   [facility]  . HOLMIUM LASER APPLICATION Left [id number]   Procedure: HOLMIUM LASER APPLICATION;  Surgeon: Alexis Frock, MD;  Location: [facility];  Service: Urology;  Laterality: Left;  . LITHOTRIPSY  years ago  . STONE EXTRACTION WITH BASKET Left 06/28/2014   Procedure: STONE EXTRACTION WITH BASKET;  Surgeon: Alexis Frock, MD;  Location: [facility];  Service: Urology;  Laterality: Left;       Home Medications    Prior to Admission medications   Medication Sig Start Date End Date Taking? Authorizing Provider  amLODipine (NORVASC) 2.5 MG tablet Take 2.5 mg by mouth daily.    Yes Historical Provider, MD  aspirin EC 81 MG tablet Take 81 mg by mouth daily.   Yes Historical Provider, MD  clopidogrel (PLAVIX) 75 MG tablet Take 1 tablet (75 mg total) by mouth daily. 10/04/15  Yes Modena Jansky, MD  escitalopram (LEXAPRO) 10 MG tablet Take 10 mg by mouth at bedtime.    Yes Historical Provider, MD  gabapentin (NEURONTIN) 300 MG capsule Take 300 mg by mouth 3 (three) times daily.   Yes Historical Provider, MD  Ginseng (GIN-ZING) 100 MG CAPS Take 100 mg by mouth daily.   Yes Historical Provider, MD  lisinopril (PRINIVIL,ZESTRIL) 5 MG tablet Take 5 mg by mouth daily.   Yes Historical Provider, MD  metoprolol tartrate (LOPRESSOR) 25 MG tablet Take 25 mg by mouth daily.  Yes Historical Provider, MD  naproxen sodium (ANAPROX) 220 MG tablet Take 220-440 mg by mouth 2 (two) times daily as needed (for pain/headache).    Yes Historical Provider, MD  pravastatin (PRAVACHOL) 40 MG tablet Take 40 mg by mouth daily with lunch.   Yes Historical Provider, MD  cephALEXin (KEFLEX) 500 MG capsule Take 1 capsule (500 mg total) by mouth 4 (four) times daily. Patient not taking: Reported on 11/10/2015 10/23/15   Leonard Schwartz, MD  ciprofloxacin (CIPRO) 500 MG tablet Take 1 tablet (500 mg total) by mouth  every 12 (twelve) hours. 11/10/15   Okey Regal, PA-C    Family History Family History  Problem Relation Age of Onset  . Diabetes Mother   . Lung cancer Mother   . CAD Father   . Diabetes Brother   . Stroke Neg Hx     Social History Social History  Substance Use Topics  . Smoking status: Never Smoker  . Smokeless tobacco: Never Used  . Alcohol use No     Allergies   Hydrocodone-acetaminophen; Morphine and related; and Valsartan   Review of Systems Review of Systems  All other systems reviewed and are negative.    Physical Exam Updated Vital Signs BP 138/68 (BP Location: Right Arm)   Pulse (!) 59   Temp 98.6 F (37 C) (Oral)   Resp 18   SpO2 95%   Physical Exam  Constitutional: He is oriented to person, place, and time. He appears well-developed and well-nourished.  HENT:  Head: Normocephalic and atraumatic.  Eyes: Conjunctivae are normal. Pupils are equal, round, and reactive to light. Right eye exhibits no discharge. Left eye exhibits no discharge. No scleral icterus.  Neck: Normal range of motion. No JVD present. No tracheal deviation present.  Pulmonary/Chest: Effort normal. No stridor.  Abdominal:  Abdomen soft nontender, no CVA tenderness, no tenderness to the back.  Neurological: He is alert and oriented to person, place, and time. Coordination normal.  Psychiatric: He has a normal mood and affect. His behavior is normal. Judgment and thought content normal.  Nursing note and vitals reviewed.    ED Treatments / Results  Labs (all labs ordered are listed, but only abnormal results are displayed) Labs Reviewed  URINALYSIS, ROUTINE W REFLEX MICROSCOPIC (NOT AT [facility]) - Abnormal; Notable for the following:       Result Value   APPearance TURBID (*)    pH 8.5 (*)    Nitrite POSITIVE (*)    Leukocytes, UA MODERATE (*)    All other components within normal limits  URINE MICROSCOPIC-ADD ON - Abnormal; Notable for the following:    Bacteria, UA MANY  (*)    Crystals TRIPLE PHOSPHATE CRYSTALS (*)    All other components within normal limits  BASIC METABOLIC PANEL - Abnormal; Notable for the following:    Glucose, Bld 102 (*)    BUN 22 (*)    All other components within normal limits  CBC WITH DIFFERENTIAL/PLATELET    EKG  EKG Interpretation None       Radiology Ct Renal Stone Study  Result Date: 11/10/2015 CLINICAL DATA:  Acute left flank pain. EXAM: CT ABDOMEN AND PELVIS WITHOUT CONTRAST TECHNIQUE: Multidetector CT imaging of the abdomen and pelvis was performed following the standard protocol without IV contrast. COMPARISON:  CT scan of May 07, 2014. FINDINGS: Mild degenerative disc disease is noted at L1-2 and L2-3. Visualized lung bases are unremarkable. No gallstones are noted. No focal abnormalities noted in the liver,  spleen or pancreas on these unenhanced images. Adrenal glands are unremarkable. No hydronephrosis or renal obstruction is noted. Small nonobstructive left renal calculus is noted. Stable 2.4 cm cyst is seen involving lower pole of right kidney. Atherosclerosis of abdominal aorta is noted without aneurysm formation. The appendix appears normal. There is no evidence of bowel obstruction. No abnormal fluid collection is noted. Stable prostatic enlargement. Mild diffuse wall thickening of the urinary bladder is noted with minimal surrounding inflammation suggesting cystitis. No significant adenopathy is noted. IMPRESSION: Small nonobstructive left renal calculus. No hydronephrosis or renal obstruction is noted. Aortic atherosclerosis. Stable prostatic enlargement. Findings concerning for cystitis. Electronically Signed   By: Marijo Conception, M.D.   On: 11/10/2015 20:39    Procedures Procedures (including critical care time)  Medications Ordered in ED Medications  cefTRIAXone (ROCEPHIN) 1 g in dextrose 5 % 50 mL IVPB (1 g Intravenous New Bag/Given 11/10/15 2127)     Initial Impression / Assessment and Plan / ED  Course  I have reviewed the triage vital signs and the nursing notes.  Pertinent labs & imaging results that were available during my care of the patient were reviewed by me and considered in my medical decision making (see chart for details).  Clinical Course     Final Clinical Impressions(s) / ED Diagnoses   Final diagnoses:  Cystitis   Labs: CBC, BMP, urinalysis  Imaging: CT renal  Consults:  Therapeutics:  Discharge Meds:   Assessment/Plan: 75 year old male presents today with left flank pain. This is not reproducible on my exam, he reports this is positional, uncertain if this was from getting up out of his recliner. Patient's CT scan shows does have nephrolithiasis, no signs of pyelonephritis, obstruction. He does have what appears to be cystitis, he will be given a dose of ceftriaxone here in the ED, discharged home on oral antibiotics, and close follow up with his primary care. He is afebrile nontoxic with no elevated white count. He has reassuring vital signs. Patient will be given strict return precautions, verbalized understanding and agreement to today's plan had no further questions or concerns at the time discharge.      New Prescriptions New Prescriptions   CIPROFLOXACIN (CIPRO) 500 MG TABLET    Take 1 tablet (500 mg total) by mouth every 12 (twelve) hours.     Okey Regal, PA-C 11/10/15 2144    Jola Schmidt, MD 11/10/15 [phone]

## 2015-11-10 NOTE — Discharge Instructions (Signed)
Please inform your primary care provider of your visit today and all relevant data. Please follow-up with urology for ongoing management of your nephrolithiasis. Please return to emergency room immediately if he expands any new or worsening signs or symptoms.

## 2015-11-10 NOTE — ED Triage Notes (Signed)
Patient presents for left flank pain x2 days with frequency, dysuria and odor. Denies N/V, fever.

## 2015-11-16 DIAGNOSIS — F325 Major depressive disorder, single episode, in full remission: Secondary | ICD-10-CM | POA: Insufficient documentation

## 2015-11-16 DIAGNOSIS — I6990 Unspecified sequelae of unspecified cerebrovascular disease: Secondary | ICD-10-CM | POA: Insufficient documentation

## 2015-12-12 ENCOUNTER — Encounter: Payer: Self-pay | Admitting: Neurology

## 2015-12-12 ENCOUNTER — Ambulatory Visit: Payer: Self-pay | Admitting: Neurology

## 2015-12-12 VITALS — BP 145/68 | HR 48 | Ht 68.0 in

## 2015-12-12 DIAGNOSIS — G459 Transient cerebral ischemic attack, unspecified: Secondary | ICD-10-CM

## 2015-12-12 NOTE — Progress Notes (Signed)
[facility] [address] [place]. [place] [address] [phone]       OFFICE FOLLOW-UP NOTE  Theodore Mendoza Date of Birth:  04/07/1940 Medical Record Number:  [id number]   HPI: Theodore Mendoza is a 75 year Caucasian male who is seen today for first office follow-up visit following hospital admission for TIA in July 2017. He is accompanied today by his wife. Theodore Mendoza is an 75 y.o. male with a history of hypertension, hyperlipidemia, myocardial infarction and Guillain-Barr syndrome in 1987 who experienced transient side weakness and slurred speech and was taken to [facility]. He has no previous history of stroke nor TIA. He's been taking aspirin 81 mg per day. He was last known well at 5:45 PM on 10/01/2015. Deficits were noted when he attempted to get out of his chair at 7:00 PM. Patient has residual severe weakness of both lower extremities as well as distal upper extremities from Guillain-Barr syndrome in 1987. He was not aware of any worsening of leg weakness. Deficits improved in route to the hospital and subsequently resolved. He has residual left facial weakness from GBS which is unchanged. LSN: 5:45 PM on 10/01/2015 tPA Given: No: Rapidly resolving deficits.  Patient's neurological deficits return back to baseline within 1 hour. CT scan of the brain showed a left cerebellopontine angle mass which appeared to be slowly enlarging compared to 2012 but no major change. Remote basal ganglia infarcts and white matter changes are noted. MRI scan of the brain showed no acute infarct and showed a 2.8 x 3.4 x 3.4 cm meningioma which was slightly increased compared to 2.2 x 3.1 x 3 cm on similar exam in 2012. Small hemorrhagic blood products in the subinsular region on the left suggested prior hemorrhagic stroke versus small cavernoma. Transthoracic echo showed normal ejection fraction. Carotid ultrasound showed no significant extracranial stenosis. Hemoglobin A1c was normal  LDL was 73 mg percent. Vision was previously on aspirin 81 mg daily and was advised to switch to Plavix 75 mg. Patient states is actually taking both medications. Does complain of bruising but no major bleeding episodes. He states his blood pressure is well controlled though it is slightly elevated at 145/68 in office today. He is tolerating Pravachol well without muscle aches and pains. Patient is able to walk with crutches which he has needed for 30 years since his Guillain-Barr syndrome in 1997. Patient had regular follow-ups for his meningioma and [facility] and has an appointment coming up in a few months. He has no new complaints today. ROS:   14 system review of systems is positive for  eye itching, double vision, leg swelling, bladder urgency, walking difficulty, skin moles, easy bruising, headache, numbness, weakness, sleep talking, skin moles and all other systems negative PMH:  Past Medical History:  Diagnosis Date  . Guillain Barr syndrome ([place])   . Guillain-Barre syndrome ([place]) Feb 13 1986  . Hypertension   . Myocardial infarction ([place]) 1997  . Stroke [facility]) Left cerebello [place] angle mass- meningioma x 2012     Social History:  Social History   Social History  . Marital status: Married    Spouse name: N/A  . Number of children: N/A  . Years of education: N/A   Occupational History  . Not on file.   Social History Main Topics  . Smoking status: Never Smoker  . Smokeless tobacco: Never Used  . Alcohol use No  . Drug use: No  . Sexual activity: Not  on file   Other Topics Concern  . Not on file   Social History Narrative  . No narrative on file    Medications:   Current Outpatient Prescriptions on File Prior to Visit  Medication Sig Dispense Refill  . amLODipine (NORVASC) 2.5 MG tablet Take 2.5 mg by mouth daily.     [name] aspirin EC 81 MG tablet Take 81 mg by mouth daily.    . clopidogrel (PLAVIX) 75 MG tablet Take 1 tablet (75 mg total) by mouth daily. 30  tablet 0  . escitalopram (LEXAPRO) 10 MG tablet Take 10 mg by mouth at bedtime.     . gabapentin (NEURONTIN) 300 MG capsule Take 300 mg by mouth 3 (three) times daily.    . Ginseng (GIN-ZING) 100 MG CAPS Take 100 mg by mouth daily.    [name] lisinopril (PRINIVIL,ZESTRIL) 5 MG tablet Take 5 mg by mouth daily.  2  . metoprolol tartrate (LOPRESSOR) 25 MG tablet Take 25 mg by mouth daily.     . pravastatin (PRAVACHOL) 40 MG tablet Take 40 mg by mouth daily with lunch.     No current facility-administered medications on file prior to visit.     Allergies:   Allergies  Allergen Reactions  . Hydrocodone-Acetaminophen Other (See Comments)    Reaction:  Confusion/weakness/hallucinations  . Morphine And Related Other (See Comments)    Reaction:  Confusion/weakness/hallucinations  . Valsartan Rash    Physical Exam   Vitals:   12/12/15 0908  BP: (!) 145/68  Pulse: (!) 2   Frail elderly caucasian male not in distress. . Afebrile. Head is nontraumatic. Neck is supple without bruit.    Cardiac exam no murmur or gallop. Lungs are clear to auscultation. Distal pulses are well felt.bilateral hand wasting and flexion contractures with bilateral foot braces.  Neurological Exam :  Mental Status: Alert, oriented, thought content appropriate. Speech fluent without evidence of aphasia. Able to follow commands without difficulty. Cranial Nerves: II-Visual fields were normal. III/IV/VI-Pupils were equal and reacted normally to light. Extraocular movements were full and conjugate.  V/VII-no facial numbness; mild left facial weakness, lower greater than upper facial weakness. VIII-normal. X-normal speech and symmetrical palatal movement. XI: trapezius strength/neck flexion strength normal bilaterally XII-midline tongue extension with normal strength. Motor: Normal strength proximally of upper extremities; atrophy and weakness of intrinsic hand muscles bilaterally; moderately severe weakness proximally  of lower extremities and no intact voluntary movement of feet. Sensory: Normal throughout. B/l ankle braces severe wasting intrinsic hand and foot muscles with flexion contractures of distal fingers bilaterally Deep Tendon Reflexes: Absent throughout. Plantars: Mute bilaterally Cerebellar: Slightly impaired coordination of upper extremities, commensurate with severe weakness and atrophy of  Muscles. Gait : deferred  Modified Rankin  3   ASSESSMENT: 75 year old Caucasian male with left hemispheric TIA in July 2017 secondary to small vessel disease. Vascular risk factors of hypertension, hyperlipidemia and coronary artery disease. Remote history of paraparesis secondary to Guillain-Barr syndrome 30 years ago and history of left cerebellopontine angle meningioma since last 3 years which is stable    PLAN: I had a long d/w patient and his wife about his recent TIA,small vessel  disease risk for recurrent stroke/TIAs, personally independently reviewed imaging studies and stroke evaluation results and answered questions.Continue Plavix 75 mg daily alone and stop aspirin as I do not see any benefit for dual antiplatelet therapy in his case for secondary stroke prevention and maintain strict control of hypertension with blood pressure goal below 130/90, diabetes with  hemoglobin A1c goal below 6.5% and lipids with LDL cholesterol goal below 70 mg/dL. I also advised the patient to eat a healthy diet with plenty of whole grains, cereals, fruits and vegetables, exercise regularly and maintain ideal body weight Followup in the future with my nurse practitioner in 6 months or call earlier if necessary. Greater than 50% of time during this 25 minute visit was spent on counseling,explanation of diagnosis, planning of further management, discussion with patient and family and coordination of care Antony Contras, MD  [facility] [address] [place], [place] [address]  Phone  [phone] Fax [phone] Note: This document was prepared with digital dictation and possible smart phrase technology. Any transcriptional errors that result from this process are unintentional

## 2015-12-12 NOTE — Patient Instructions (Signed)
I had a long d/w patient and his wife about his recent TIA,small vessel  disease risk for recurrent stroke/TIAs, personally independently reviewed imaging studies and stroke evaluation results and answered questions.Continue Plavix 75 mg daily alone and stop aspirin as I do not see any benefit for dual antiplatelet therapy in his case for secondary stroke prevention and maintain strict control of hypertension with blood pressure goal below 130/90, diabetes with hemoglobin A1c goal below 6.5% and lipids with LDL cholesterol goal below 70 mg/dL. I also advised the patient to eat a healthy diet with plenty of whole grains, cereals, fruits and vegetables, exercise regularly and maintain ideal body weight Followup in the future with my nurse practitioner in 6 months or call earlier if necessary.  Stroke Prevention Some medical conditions and behaviors are associated with an increased chance of having a stroke. You may prevent a stroke by making healthy choices and managing medical conditions. HOW CAN I REDUCE MY RISK OF HAVING A STROKE?   Stay physically active. Get at least 30 minutes of activity on most or all days.  Do not smoke. It may also be helpful to avoid exposure to secondhand smoke.  Limit alcohol use. Moderate alcohol use is considered to be:  No more than 2 drinks per day for men.  No more than 1 drink per day for nonpregnant women.  Eat healthy foods. This involves:  Eating 5 or more servings of fruits and vegetables a day.  Making dietary changes that address high blood pressure (hypertension), high cholesterol, diabetes, or obesity.  Manage your cholesterol levels.  Making food choices that are high in fiber and low in saturated fat, trans fat, and cholesterol may control cholesterol levels.  Take any prescribed medicines to control cholesterol as directed by your health care provider.  Manage your diabetes.  Controlling your carbohydrate and sugar intake is recommended to  manage diabetes.  Take any prescribed medicines to control diabetes as directed by your health care provider.  Control your hypertension.  Making food choices that are low in salt (sodium), saturated fat, trans fat, and cholesterol is recommended to manage hypertension.  Ask your health care provider if you need treatment to lower your blood pressure. Take any prescribed medicines to control hypertension as directed by your health care provider.  If you are 19-33 years of age, have your blood pressure checked every 3-5 years. If you are 37 years of age or older, have your blood pressure checked every year.  Maintain a healthy weight.  Reducing calorie intake and making food choices that are low in sodium, saturated fat, trans fat, and cholesterol are recommended to manage weight.  Stop drug abuse.  Avoid taking birth control pills.  Talk to your health care provider about the risks of taking birth control pills if you are over 22 years old, smoke, get migraines, or have ever had a blood clot.  Get evaluated for sleep disorders (sleep apnea).  Talk to your health care provider about getting a sleep evaluation if you snore a lot or have excessive sleepiness.  Take medicines only as directed by your health care provider.  For some people, aspirin or blood thinners (anticoagulants) are helpful in reducing the risk of forming abnormal blood clots that can lead to stroke. If you have the irregular heart rhythm of atrial fibrillation, you should be on a blood thinner unless there is a good reason you cannot take them.  Understand all your medicine instructions.  Make sure that other  conditions (such as anemia or atherosclerosis) are addressed. SEEK IMMEDIATE MEDICAL CARE IF:   You have sudden weakness or numbness of the face, arm, or leg, especially on one side of the body.  Your face or eyelid droops to one side.  You have sudden confusion.  You have trouble speaking (aphasia) or  understanding.  You have sudden trouble seeing in one or both eyes.  You have sudden trouble walking.  You have dizziness.  You have a loss of balance or coordination.  You have a sudden, severe headache with no known cause.  You have new chest pain or an irregular heartbeat. Any of these symptoms may represent a serious problem that is an emergency. Do not wait to see if the symptoms will go away. Get medical help at once. Call your local emergency services (911 in [place] Do not drive yourself to the hospital.   This information is not intended to replace advice given to you by your health care provider. Make sure you discuss any questions you have with your health care provider.   Document Released: 04/26/2004 Document Revised: 04/09/2014 Document Reviewed: 09/19/2012 Elsevier Interactive Patient Education [employer].

## 2016-06-12 ENCOUNTER — Ambulatory Visit: Payer: Medicare Other | Admitting: Nurse Practitioner

## 2016-11-19 DIAGNOSIS — E785 Hyperlipidemia, unspecified: Secondary | ICD-10-CM | POA: Diagnosis not present

## 2016-11-19 DIAGNOSIS — I10 Essential (primary) hypertension: Secondary | ICD-10-CM | POA: Diagnosis not present

## 2016-11-26 DIAGNOSIS — I10 Essential (primary) hypertension: Secondary | ICD-10-CM | POA: Diagnosis not present

## 2016-11-26 DIAGNOSIS — G610 Guillain-Barre syndrome: Secondary | ICD-10-CM | POA: Diagnosis not present

## 2016-11-26 DIAGNOSIS — I2510 Atherosclerotic heart disease of native coronary artery without angina pectoris: Secondary | ICD-10-CM | POA: Diagnosis not present

## 2016-11-26 DIAGNOSIS — F3342 Major depressive disorder, recurrent, in full remission: Secondary | ICD-10-CM | POA: Diagnosis not present

## 2016-12-10 DIAGNOSIS — I10 Essential (primary) hypertension: Secondary | ICD-10-CM | POA: Diagnosis not present

## 2016-12-10 DIAGNOSIS — Z0000 Encounter for general adult medical examination without abnormal findings: Secondary | ICD-10-CM | POA: Diagnosis not present

## 2016-12-10 DIAGNOSIS — I2510 Atherosclerotic heart disease of native coronary artery without angina pectoris: Secondary | ICD-10-CM | POA: Diagnosis not present

## 2016-12-13 DIAGNOSIS — M21371 Foot drop, right foot: Secondary | ICD-10-CM | POA: Diagnosis not present

## 2016-12-13 DIAGNOSIS — M21372 Foot drop, left foot: Secondary | ICD-10-CM | POA: Diagnosis not present

## 2017-02-05 DIAGNOSIS — R34 Anuria and oliguria: Secondary | ICD-10-CM | POA: Diagnosis not present

## 2017-02-05 DIAGNOSIS — R350 Frequency of micturition: Secondary | ICD-10-CM | POA: Diagnosis not present

## 2017-02-05 DIAGNOSIS — N200 Calculus of kidney: Secondary | ICD-10-CM | POA: Diagnosis not present

## 2017-02-18 DIAGNOSIS — R799 Abnormal finding of blood chemistry, unspecified: Secondary | ICD-10-CM | POA: Diagnosis not present

## 2017-02-18 DIAGNOSIS — E785 Hyperlipidemia, unspecified: Secondary | ICD-10-CM | POA: Diagnosis not present

## 2017-02-25 NOTE — Progress Notes (Signed)
[facility]  PATIENT: Theodore Mendoza DOB: [birth year]   REASON FOR VISIT: Follow-up for TIA in July 2017,  Right hand tremor, intermittent HISTORY FROM: Patient and wife Theodore Mendoza 11/27 2018CM Theodore Mendoza, 76 year old male returns for follow-up with a history of TIA in July 2017.  He has risk factors of hypertension hyperlipidemia myocardial infarction and he has a history of Theodore Mendoza in 1987, with residual weakness of the upper and lower extremities.  He also has a history of meningioma followed by [facility].  He reports new onset of right head tremor which is intermittent for several months.  It does not interfere with his ability to eat or perform other activities of daily living.  He is currently on aspirin and Plavix for secondary stroke prevention without further stroke or TIA symptoms. Dr Leonie Man had instructed patient just to be on Plavix at his visit last year.  In addition he is on Pravachol for hyperlipidemia without complaints of myalgias.  Blood pressure in the office today 165/72.  Claims he is taking his blood pressure medications.  He saw his primary care provider earlier today and his blood pressure at that appointment was 110/78.  He returns for reevaluation.  He is in a wheelchair. 12/12/15 PSMr Bouknight is a 30 year Caucasian male who is seen today for first office follow-up visit following hospital admission for TIA in July 2017. He is accompanied today by his wife. Theodore Mendoza is an 76 y.o. male with a history ofhypertension, hyperlipidemia, myocardial infarction and Guillain-Barr syndrome in 1987who experienced transient side weakness and slurred speechand was taken to [facility]. He has no previous history of stroke nor TIA. He's been taking aspirin 81 mg per day. He was last known well at 5:45 PM on 10/01/2015. Deficits were noted when he attempted to get out of his chair at 7:00 PM. Patient has residual  severe weakness of both lower extremities as well as distal upper extremities from Guillain-Barr syndrome in 1987. He was not aware of any worsening of leg weakness. Deficits improved in route to the hospital and subsequently resolved. He has residual left facial weakness from GBS which is unchanged. LSN:5:45 PM on 10/01/2015 tPA Given:No: Rapidly resolving deficits.  Patient's neurological deficits return back to baseline within 1 hour. CT scan of the brain showed a left cerebellopontine angle mass which appeared to be slowly enlarging compared to 2012 but no major change. Remote basal ganglia infarcts and white matter changes are noted. MRI scan of the brain showed no acute infarct and showed a 2.8 x 3.4 x 3.4 cm meningioma which was slightly increased compared to 2.2 x 3.1 x 3 cm on similar exam in 2012. Small hemorrhagic blood products in the subinsular region on the left suggested prior hemorrhagic stroke versus small cavernoma. Transthoracic echo showed normal ejection fraction. Carotid ultrasound showed no significant extracranial stenosis. Hemoglobin A1c was normal LDL was 73 mg percent. was previously on aspirin 81 mg daily and was advised to switch to Plavix 75 mg. Patient states is actually taking both medications. Does complain of bruising but no major bleeding episodes. He states his blood pressure is well controlled though it is slightly elevated at 145/68 in office today. He is tolerating Pravachol well without muscle aches and pains. Patient is able to walk with crutches which he has needed for 30 years since his Guillain-Barr syndrome in 1997. Patient had regular follow-ups for his meningioma and  [facility] and has an appointment coming up in a few months. He has no new complaints today.   REVIEW OF SYSTEMS: Full 14 system review of systems performed and notable only for those listed, all others are neg:  Constitutional: neg  Cardiovascular: neg Ear/Nose/Throat: neg  Skin:  neg Eyes: neg Respiratory: neg Gastroitestinal: neg  Hematology/Lymphatic: neg  Endocrine: neg Musculoskeletal:neg Allergy/Immunology: neg Neurological: neg Psychiatric: neg Sleep : neg   ALLERGIES: Allergies  Allergen Reactions  . Hydrocodone-Acetaminophen Other (See Comments)    Reaction:  Confusion/weakness/hallucinations  . Morphine And Related Other (See Comments)    Reaction:  Confusion/weakness/hallucinations  . Valsartan Rash    HOME MEDICATIONS: Outpatient Medications Prior to Visit  Medication Sig Dispense Refill  . amLODipine (NORVASC) 2.5 MG tablet Take 2.5 mg by mouth daily.     [name] aspirin EC 81 MG tablet Take 81 mg by mouth daily.    . clopidogrel (PLAVIX) 75 MG tablet Take 1 tablet (75 mg total) by mouth daily. 30 tablet 0  . escitalopram (LEXAPRO) 10 MG tablet Take 10 mg by mouth at bedtime.     . gabapentin (NEURONTIN) 300 MG capsule Take 300 mg by mouth 3 (three) times daily.    . Ginseng (GIN-ZING) 100 MG CAPS Take 100 mg by mouth daily.    [name] lisinopril (PRINIVIL,ZESTRIL) 5 MG tablet Take 5 mg by mouth daily.  2  . metoprolol tartrate (LOPRESSOR) 25 MG tablet Take 25 mg by mouth daily.     . pravastatin (PRAVACHOL) 40 MG tablet Take 40 mg by mouth daily with lunch.     No facility-administered medications prior to visit.     PAST MEDICAL HISTORY: Past Medical History:  Diagnosis Date  . Guillain Barr syndrome ([place])   . Guillain-Barre syndrome ([place]) Feb 13 1986  . Hypertension   . Myocardial infarction ([place]) 1997  . Stroke [facility])     PAST SURGICAL HISTORY: Past Surgical History:  Procedure Laterality Date  . CORONARY ANGIOPLASTY  1997   after mi  . CYSTOSCOPY WITH RETROGRADE PYELOGRAM, URETEROSCOPY AND STENT PLACEMENT Left 06/28/2014   Procedure: 1ST STAGE CYSTOSCOPY/URETEROSCOPY/STENT PLACEMENT;  Surgeon: Alexis Frock, MD;  Location: [facility];  Service: Urology;  Laterality: Left;  . CYSTOSCOPY WITH STENT PLACEMENT Left 05/08/2014   Procedure:  CYSTOSCOPY, RETROGRADE PYELOGRAM WITH LEFT URETERAL STENT PLACEMENT;  Surgeon: Alexis Frock, MD;  Location: [facility];  Service: Urology;  Laterality: Left;  . EYE SURGERY Right may 2015   growth removed, july 2015left eye cataract removed, right eye catarct removed  . gamma kniferadiation treatment  [facility] 09 [facility]  . HOLMIUM LASER APPLICATION Left 3/53/6144   Procedure: HOLMIUM LASER APPLICATION;  Surgeon: Alexis Frock, MD;  [facility]: [facility];  Service: Urology;  Laterality: Left;  . LITHOTRIPSY  years ago  . STONE EXTRACTION WITH BASKET Left 06/28/2014   Procedure: STONE EXTRACTION WITH BASKET;  Surgeon: Alexis Frock, MD;  [facility];  Service: Urology;  Laterality: Left;    FAMILY HISTORY: Family History  Problem Relation Age of Onset  . Diabetes Mother   . Lung cancer Mother   . CAD Father   . Diabetes Brother   . Diabetes Sister   . Stroke Sister     SOCIAL HISTORY: Social History   Socioeconomic History  . Marital status: Married    Spouse name: Not on file  . Number of children: Not on file  . Years of education: Not on  file  . Highest education level: Not on file  Social Needs  . Financial resource strain: Not on file  . Food insecurity - worry: Not on file  . Food insecurity - inability: Not on file  . Transportation needs - medical: Not on file  . Transportation needs - non-medical: Not on file  Occupational History  . Not on file  Tobacco Use  . Smoking status: Never Smoker  . Smokeless tobacco: Never Used  Substance and Sexual Activity  . Alcohol use: No  . Drug use: No  . Sexual activity: Not on file  Other Topics Concern  . Not on file  Social History Narrative   Lives with wife     PHYSICAL EXAM  Vitals:   02/26/17 1319  BP: (!) 165/72  Pulse: (!) 52   There is no height or weight on file to calculate BMI.  Generalized: Well developed, in no acute distress  Head: normocephalic and atraumatic,. Oropharynx  benign  Neck: Supple, no carotid bruits  Cardiac: Regular rate rhythm, no murmur  Musculoskeletal bilateral hand wasting and flexion contractures, bilateral foot braces Neurological examination   Mentation: Alert oriented to time, place, history taking. Attention span and concentration appropriate. Recent and remote memory intact.  Follows all commands speech and language fluent.   Cranial nerve II-XII: Pupils were equal round reactive to light extraocular movements were full, visual field were full on confrontational test. Facial sensation normal, mild left facial weakness. hearing was intact to finger rubbing bilaterally. Uvula tongue midline. head turning and shoulder shrug were normal and symmetric.Tongue protrusion into cheek strength was normal. Motor: Normal strength proximally of upper extremities; atrophy and weakness of intrinsic hand muscles bilaterally; moderately severe weakness proximally of lower extremities and no intact voluntary movement of feet. Sensory: normal and symmetric to light touch, pinprick, and  Vibration, Coordination: Impaired coordination of upper extremities correlate to severe weakness and atrophy of muscles Reflexes: Absent throughout  Gait and Station: Deferred in wheelchair  DIAGNOSTIC DATA (LABS, IMAGING, TESTING) - I reviewed patient records, labs, notes, testing and imaging myself where available.  Lab Results  Component Value Date   WBC 9.1 11/10/2015   HGB 13.6 11/10/2015   HCT 40.4 11/10/2015   MCV 92.9 11/10/2015   PLT 203 11/10/2015      Component Value Date/Time   NA 143 11/10/2015 1859   K 4.5 11/10/2015 1859   CL 108 11/10/2015 1859   CO2 28 11/10/2015 1859   GLUCOSE 102 (H) 11/10/2015 1859   BUN 22 (H) 11/10/2015 1859   CREATININE 0.65 11/10/2015 1859   CALCIUM 9.0 11/10/2015 1859   PROT 6.4 (L) 10/01/2015 2053   ALBUMIN 4.1 10/01/2015 2053   AST 15 10/01/2015 2053   ALT 11 (L) 10/01/2015 2053   ALKPHOS 64 10/01/2015 2053    BILITOT 0.8 10/01/2015 2053   GFRNONAA >60 11/10/2015 1859   GFRAA >60 11/10/2015 1859   Lab Results  Component Value Date   CHOL 118 10/02/2015   HDL 37 (L) 10/02/2015   LDLCALC 73 10/02/2015   TRIG 39 10/02/2015   CHOLHDL 3.2 10/02/2015   Lab Results  Component Value Date   HGBA1C 5.3 10/02/2015      ASSESSMENT AND PLAN 76 year old Caucasian male with left hemispheric TIA in July 2017 secondary to small vessel disease. Vascular risk factors of hypertension, hyperlipidemia and coronary artery disease. Remote history of paraparesis secondary to Guillain-Barr syndrome 30 years ago and history of left cerebellopontine angle meningioma, which  is stable. The patient is a current patient of Dr. Leonie Man  who is out of the office today . This note is sent to the work in doctor.       PLAN:Discussed with Dr. Erlinda Hong Stressed the importance of management of risk factors to prevent further stroke Continue Plavix but stop Aspirin for secondary stroke prevention Maintain strict control of hypertension with blood pressure goal below 130/90, today's reading 165/72 continue antihypertensive medications Control of diabetes with hemoglobin A1c below 6.5 followed by primary care  Cholesterol with LDL cholesterol less than 70, followed by primary care, continue Pravachol eat healthy diet with whole grains,  fresh fruits and vegetables Use weights for hand tremor Continue follow up for meingioma at [facility] Last MRI of the brain was stable Will discharge from stroke clinic Dennie Bible, [facility], [facility], [facility] [address], [place], [place] [address] [phone]

## 2017-02-26 ENCOUNTER — Ambulatory Visit: Payer: Medicare Other | Admitting: Nurse Practitioner

## 2017-02-26 ENCOUNTER — Encounter (INDEPENDENT_AMBULATORY_CARE_PROVIDER_SITE_OTHER): Payer: Self-pay

## 2017-02-26 ENCOUNTER — Telehealth: Payer: Self-pay | Admitting: *Deleted

## 2017-02-26 ENCOUNTER — Encounter: Payer: Self-pay | Admitting: Nurse Practitioner

## 2017-02-26 VITALS — BP 165/72 | HR 52

## 2017-02-26 DIAGNOSIS — I10 Essential (primary) hypertension: Secondary | ICD-10-CM | POA: Diagnosis not present

## 2017-02-26 DIAGNOSIS — G459 Transient cerebral ischemic attack, unspecified: Secondary | ICD-10-CM | POA: Diagnosis not present

## 2017-02-26 DIAGNOSIS — I2510 Atherosclerotic heart disease of native coronary artery without angina pectoris: Secondary | ICD-10-CM | POA: Diagnosis not present

## 2017-02-26 DIAGNOSIS — G610 Guillain-Barre syndrome: Secondary | ICD-10-CM | POA: Insufficient documentation

## 2017-02-26 DIAGNOSIS — F3342 Major depressive disorder, recurrent, in full remission: Secondary | ICD-10-CM | POA: Diagnosis not present

## 2017-02-26 DIAGNOSIS — E785 Hyperlipidemia, unspecified: Secondary | ICD-10-CM | POA: Diagnosis not present

## 2017-02-26 NOTE — Patient Instructions (Addendum)
Stressed the importance of management of risk factors to prevent further stroke Continue Plavix for secondary stroke prevention Maintain strict control of hypertension with blood pressure goal below 130/90, today's reading 165/72 continue antihypertensive medications Control of diabetes with hemoglobin A1c below 6.5 followed by primary care  Cholesterol with LDL cholesterol less than 70, followed by primary care, continue Pravachol eat healthy diet with whole grains,  fresh fruits and vegetables Use weights for hand tremor Continue follow up for me[facility]ioma at [facility] Last MRI of the brain was stable Will discharge from stroke clinic

## 2017-02-26 NOTE — Telephone Encounter (Signed)
LVM for wife, [place] on [place] and advised her that her husband was to stop the aspirin and continue Plavix only per Dr Clydene Fake note. Repeated this again and asked she be certain he is not taking ASA, and left number for any questions.

## 2017-02-27 NOTE — Progress Notes (Signed)
I reviewed above note and agree with the assessment and plan.   Rosalin Hawking, MD PhD Stroke Neurology 02/27/2017 9:59 AM

## 2017-02-28 DIAGNOSIS — R001 Bradycardia, unspecified: Secondary | ICD-10-CM | POA: Diagnosis not present

## 2017-02-28 DIAGNOSIS — I2510 Atherosclerotic heart disease of native coronary artery without angina pectoris: Secondary | ICD-10-CM | POA: Diagnosis not present

## 2017-02-28 DIAGNOSIS — I10 Essential (primary) hypertension: Secondary | ICD-10-CM | POA: Diagnosis not present

## 2017-02-28 DIAGNOSIS — Z955 Presence of coronary angioplasty implant and graft: Secondary | ICD-10-CM | POA: Diagnosis not present

## 2017-02-28 DIAGNOSIS — I252 Old myocardial infarction: Secondary | ICD-10-CM | POA: Diagnosis not present

## 2017-03-11 DIAGNOSIS — Z79899 Other long term (current) drug therapy: Secondary | ICD-10-CM | POA: Diagnosis not present

## 2017-03-11 DIAGNOSIS — L03116 Cellulitis of left lower limb: Secondary | ICD-10-CM | POA: Diagnosis not present

## 2017-03-11 DIAGNOSIS — Z7982 Long term (current) use of aspirin: Secondary | ICD-10-CM | POA: Diagnosis not present

## 2017-03-11 DIAGNOSIS — M7989 Other specified soft tissue disorders: Secondary | ICD-10-CM | POA: Diagnosis not present

## 2017-05-15 DIAGNOSIS — I10 Essential (primary) hypertension: Secondary | ICD-10-CM | POA: Diagnosis not present

## 2017-05-15 DIAGNOSIS — R509 Fever, unspecified: Secondary | ICD-10-CM | POA: Diagnosis not present

## 2017-05-15 DIAGNOSIS — J209 Acute bronchitis, unspecified: Secondary | ICD-10-CM | POA: Diagnosis not present

## 2017-06-18 DIAGNOSIS — E785 Hyperlipidemia, unspecified: Secondary | ICD-10-CM | POA: Diagnosis not present

## 2017-06-18 DIAGNOSIS — R799 Abnormal finding of blood chemistry, unspecified: Secondary | ICD-10-CM | POA: Diagnosis not present

## 2017-06-26 DIAGNOSIS — G609 Hereditary and idiopathic neuropathy, unspecified: Secondary | ICD-10-CM | POA: Insufficient documentation

## 2017-06-26 DIAGNOSIS — G610 Guillain-Barre syndrome: Secondary | ICD-10-CM | POA: Diagnosis not present

## 2017-06-26 DIAGNOSIS — I2510 Atherosclerotic heart disease of native coronary artery without angina pectoris: Secondary | ICD-10-CM | POA: Diagnosis not present

## 2017-06-26 DIAGNOSIS — F3342 Major depressive disorder, recurrent, in full remission: Secondary | ICD-10-CM | POA: Diagnosis not present

## 2017-06-26 DIAGNOSIS — I10 Essential (primary) hypertension: Secondary | ICD-10-CM | POA: Diagnosis not present

## 2017-08-14 DIAGNOSIS — D320 Benign neoplasm of cerebral meninges: Secondary | ICD-10-CM | POA: Diagnosis not present

## 2017-08-14 DIAGNOSIS — G9389 Other specified disorders of brain: Secondary | ICD-10-CM | POA: Diagnosis not present

## 2017-08-14 DIAGNOSIS — D329 Benign neoplasm of meninges, unspecified: Secondary | ICD-10-CM | POA: Diagnosis not present

## 2017-09-17 DIAGNOSIS — E785 Hyperlipidemia, unspecified: Secondary | ICD-10-CM | POA: Diagnosis not present

## 2017-09-23 DIAGNOSIS — D649 Anemia, unspecified: Secondary | ICD-10-CM | POA: Diagnosis not present

## 2017-09-26 DIAGNOSIS — I10 Essential (primary) hypertension: Secondary | ICD-10-CM | POA: Diagnosis not present

## 2017-09-26 DIAGNOSIS — I2510 Atherosclerotic heart disease of native coronary artery without angina pectoris: Secondary | ICD-10-CM | POA: Diagnosis not present

## 2017-09-26 DIAGNOSIS — G610 Guillain-Barre syndrome: Secondary | ICD-10-CM | POA: Diagnosis not present

## 2017-09-26 DIAGNOSIS — F3342 Major depressive disorder, recurrent, in full remission: Secondary | ICD-10-CM | POA: Diagnosis not present

## 2017-09-30 DIAGNOSIS — D649 Anemia, unspecified: Secondary | ICD-10-CM | POA: Diagnosis not present

## 2017-11-04 DIAGNOSIS — R300 Dysuria: Secondary | ICD-10-CM | POA: Diagnosis not present

## 2017-11-04 DIAGNOSIS — R3915 Urgency of urination: Secondary | ICD-10-CM | POA: Diagnosis not present

## 2017-11-04 DIAGNOSIS — R350 Frequency of micturition: Secondary | ICD-10-CM | POA: Diagnosis not present

## 2017-11-26 DIAGNOSIS — M79674 Pain in right toe(s): Secondary | ICD-10-CM | POA: Diagnosis not present

## 2017-11-26 DIAGNOSIS — L84 Corns and callosities: Secondary | ICD-10-CM | POA: Diagnosis not present

## 2017-12-23 DIAGNOSIS — I10 Essential (primary) hypertension: Secondary | ICD-10-CM | POA: Diagnosis not present

## 2017-12-23 DIAGNOSIS — E785 Hyperlipidemia, unspecified: Secondary | ICD-10-CM | POA: Diagnosis not present

## 2017-12-30 DIAGNOSIS — G610 Guillain-Barre syndrome: Secondary | ICD-10-CM | POA: Diagnosis not present

## 2017-12-30 DIAGNOSIS — I10 Essential (primary) hypertension: Secondary | ICD-10-CM | POA: Diagnosis not present

## 2017-12-30 DIAGNOSIS — F3342 Major depressive disorder, recurrent, in full remission: Secondary | ICD-10-CM | POA: Diagnosis not present

## 2017-12-30 DIAGNOSIS — D649 Anemia, unspecified: Secondary | ICD-10-CM | POA: Diagnosis not present

## 2017-12-30 DIAGNOSIS — D329 Benign neoplasm of meninges, unspecified: Secondary | ICD-10-CM | POA: Diagnosis not present

## 2017-12-31 DIAGNOSIS — D649 Anemia, unspecified: Secondary | ICD-10-CM | POA: Diagnosis not present

## 2018-02-10 DIAGNOSIS — N202 Calculus of kidney with calculus of ureter: Secondary | ICD-10-CM | POA: Diagnosis not present

## 2018-03-06 DIAGNOSIS — R9431 Abnormal electrocardiogram [ECG] [EKG]: Secondary | ICD-10-CM | POA: Diagnosis not present

## 2018-03-06 DIAGNOSIS — I2510 Atherosclerotic heart disease of native coronary artery without angina pectoris: Secondary | ICD-10-CM | POA: Diagnosis not present

## 2018-03-06 DIAGNOSIS — Z79899 Other long term (current) drug therapy: Secondary | ICD-10-CM | POA: Diagnosis not present

## 2018-03-06 DIAGNOSIS — R001 Bradycardia, unspecified: Secondary | ICD-10-CM | POA: Diagnosis not present

## 2018-03-06 DIAGNOSIS — I252 Old myocardial infarction: Secondary | ICD-10-CM | POA: Diagnosis not present

## 2018-03-07 DIAGNOSIS — R001 Bradycardia, unspecified: Secondary | ICD-10-CM | POA: Diagnosis not present

## 2018-03-07 DIAGNOSIS — R9431 Abnormal electrocardiogram [ECG] [EKG]: Secondary | ICD-10-CM | POA: Diagnosis not present

## 2018-03-31 DIAGNOSIS — I10 Essential (primary) hypertension: Secondary | ICD-10-CM | POA: Diagnosis not present

## 2018-03-31 DIAGNOSIS — D649 Anemia, unspecified: Secondary | ICD-10-CM | POA: Diagnosis not present

## 2018-04-07 DIAGNOSIS — I10 Essential (primary) hypertension: Secondary | ICD-10-CM | POA: Diagnosis not present

## 2018-04-07 DIAGNOSIS — D510 Vitamin B12 deficiency anemia due to intrinsic factor deficiency: Secondary | ICD-10-CM | POA: Diagnosis not present

## 2018-04-07 DIAGNOSIS — E785 Hyperlipidemia, unspecified: Secondary | ICD-10-CM | POA: Diagnosis not present

## 2018-04-07 DIAGNOSIS — D329 Benign neoplasm of meninges, unspecified: Secondary | ICD-10-CM | POA: Diagnosis not present

## 2018-04-14 DIAGNOSIS — E538 Deficiency of other specified B group vitamins: Secondary | ICD-10-CM | POA: Diagnosis not present

## 2018-04-21 DIAGNOSIS — E538 Deficiency of other specified B group vitamins: Secondary | ICD-10-CM | POA: Diagnosis not present

## 2018-04-28 DIAGNOSIS — E538 Deficiency of other specified B group vitamins: Secondary | ICD-10-CM | POA: Diagnosis not present

## 2018-05-06 DIAGNOSIS — E785 Hyperlipidemia, unspecified: Secondary | ICD-10-CM | POA: Diagnosis not present

## 2018-05-06 DIAGNOSIS — I10 Essential (primary) hypertension: Secondary | ICD-10-CM | POA: Diagnosis not present

## 2018-06-09 DIAGNOSIS — E538 Deficiency of other specified B group vitamins: Secondary | ICD-10-CM | POA: Diagnosis not present

## 2018-06-17 DIAGNOSIS — Z0120 Encounter for dental examination and cleaning without abnormal findings: Secondary | ICD-10-CM | POA: Diagnosis not present

## 2018-07-01 DIAGNOSIS — E785 Hyperlipidemia, unspecified: Secondary | ICD-10-CM | POA: Diagnosis not present

## 2018-07-01 DIAGNOSIS — I10 Essential (primary) hypertension: Secondary | ICD-10-CM | POA: Diagnosis not present

## 2018-07-07 DIAGNOSIS — I10 Essential (primary) hypertension: Secondary | ICD-10-CM | POA: Diagnosis not present

## 2018-07-07 DIAGNOSIS — F331 Major depressive disorder, recurrent, moderate: Secondary | ICD-10-CM | POA: Diagnosis not present

## 2018-07-07 DIAGNOSIS — E785 Hyperlipidemia, unspecified: Secondary | ICD-10-CM | POA: Diagnosis not present

## 2018-08-28 DIAGNOSIS — E785 Hyperlipidemia, unspecified: Secondary | ICD-10-CM | POA: Diagnosis not present

## 2018-08-28 DIAGNOSIS — I10 Essential (primary) hypertension: Secondary | ICD-10-CM | POA: Diagnosis not present

## 2018-09-03 DIAGNOSIS — G609 Hereditary and idiopathic neuropathy, unspecified: Secondary | ICD-10-CM | POA: Diagnosis not present

## 2018-09-03 DIAGNOSIS — I10 Essential (primary) hypertension: Secondary | ICD-10-CM | POA: Diagnosis not present

## 2018-09-03 DIAGNOSIS — Z8669 Personal history of other diseases of the nervous system and sense organs: Secondary | ICD-10-CM | POA: Diagnosis not present

## 2018-09-03 DIAGNOSIS — F3342 Major depressive disorder, recurrent, in full remission: Secondary | ICD-10-CM | POA: Diagnosis not present

## 2018-10-10 ENCOUNTER — Ambulatory Visit: Payer: Medicare Other | Admitting: Family Medicine

## 2018-10-13 ENCOUNTER — Other Ambulatory Visit: Payer: Self-pay

## 2018-10-15 ENCOUNTER — Encounter: Payer: Self-pay | Admitting: Family Medicine

## 2018-10-15 ENCOUNTER — Other Ambulatory Visit: Payer: Self-pay

## 2018-10-15 ENCOUNTER — Ambulatory Visit (INDEPENDENT_AMBULATORY_CARE_PROVIDER_SITE_OTHER): Payer: Medicare Other | Admitting: Family Medicine

## 2018-10-15 VITALS — BP 156/70 | HR 50 | Temp 97.8°F

## 2018-10-15 DIAGNOSIS — G459 Transient cerebral ischemic attack, unspecified: Secondary | ICD-10-CM

## 2018-10-15 DIAGNOSIS — E785 Hyperlipidemia, unspecified: Secondary | ICD-10-CM

## 2018-10-15 DIAGNOSIS — G8222 Paraplegia, incomplete: Secondary | ICD-10-CM

## 2018-10-15 DIAGNOSIS — Z7689 Persons encountering health services in other specified circumstances: Secondary | ICD-10-CM

## 2018-10-15 DIAGNOSIS — G610 Guillain-Barre syndrome: Secondary | ICD-10-CM

## 2018-10-15 DIAGNOSIS — I10 Essential (primary) hypertension: Secondary | ICD-10-CM | POA: Diagnosis not present

## 2018-10-15 DIAGNOSIS — F419 Anxiety disorder, unspecified: Secondary | ICD-10-CM

## 2018-10-15 DIAGNOSIS — D329 Benign neoplasm of meninges, unspecified: Secondary | ICD-10-CM

## 2018-10-15 MED ORDER — PRAVASTATIN SODIUM 40 MG PO TABS: 40.0000 mg | ORAL_TABLET | Freq: Every day | ORAL | 1 refills | Status: DC

## 2018-10-15 MED ORDER — AMLODIPINE BESYLATE 2.5 MG PO TABS: 2.5000 mg | ORAL_TABLET | Freq: Every day | ORAL | 1 refills | Status: DC

## 2018-10-15 MED ORDER — ESCITALOPRAM OXALATE 10 MG PO TABS: 10.0000 mg | ORAL_TABLET | Freq: Every day | ORAL | 1 refills | Status: DC

## 2018-10-15 MED ORDER — CLOPIDOGREL BISULFATE 75 MG PO TABS: 75.0000 mg | ORAL_TABLET | Freq: Every day | ORAL | 1 refills | Status: DC

## 2018-10-15 MED ORDER — LISINOPRIL 5 MG PO TABS: 5.0000 mg | ORAL_TABLET | Freq: Every day | ORAL | 1 refills | Status: DC

## 2018-10-15 MED ORDER — METOPROLOL TARTRATE 25 MG PO TABS: 25.0000 mg | ORAL_TABLET | Freq: Every day | ORAL | 1 refills | Status: DC

## 2018-10-15 MED ORDER — GABAPENTIN 300 MG PO CAPS: 300.0000 mg | ORAL_CAPSULE | Freq: Three times a day (TID) | ORAL | 1 refills | Status: DC

## 2018-10-15 NOTE — Patient Instructions (Signed)

## 2018-10-15 NOTE — Progress Notes (Signed)
New Patient Office Visit  Subjective:  Patient ID: Theodore Mendoza, male    DOB: Aug 29, 1940  Age: 78 y.o. MRN: [id number]  CC:  Chief Complaint  Patient presents with  . New Patient (Initial Visit)    HPI Theodore Mendoza presents to establish care; he is transferring from Dr. Murrell Redden office which has recently closed. He has no complaints today.  He does see Dr. Arlan Organ yearly for his meningioma.   Depression screen PHQ 2/9 10/15/2018  Decreased Interest 0  Down, Depressed, Hopeless 0  PHQ - 2 Score 0    Review of Systems  Constitutional: Negative for chills, fever, malaise/fatigue and weight loss.  HENT: Negative for congestion, ear discharge, ear pain, nosebleeds, sinus pain, sore throat and tinnitus.   Eyes: Negative for blurred vision, double vision, pain, discharge and redness.  Respiratory: Negative for cough, shortness of breath and wheezing.   Cardiovascular: Negative for chest pain, palpitations and leg swelling.  Gastrointestinal: Negative for abdominal pain, heartburn, nausea and vomiting.  Skin: Negative for rash.  Neurological: Negative for dizziness, seizures, weakness and headaches.  Psychiatric/Behavioral: Negative for depression, substance abuse and suicidal ideas. The patient is not nervous/anxious.     Current Outpatient Medications:  .  amLODipine (NORVASC) 2.5 MG tablet, Take 1 tablet (2.5 mg total) by mouth daily., Disp: 90 tablet, Rfl: 1 .  clopidogrel (PLAVIX) 75 MG tablet, Take 1 tablet (75 mg total) by mouth daily., Disp: 90 tablet, Rfl: 1 .  escitalopram (LEXAPRO) 10 MG tablet, Take 1 tablet (10 mg total) by mouth at bedtime., Disp: 90 tablet, Rfl: 1 .  Ferrous Sulfate (IRON) 325 (65 Fe) MG TABS, Take by mouth., Disp: , Rfl:  .  gabapentin (NEURONTIN) 300 MG capsule, Take 1 capsule (300 mg total) by mouth 3 (three) times daily., Disp: 270 capsule, Rfl: 1 .  lisinopril (ZESTRIL) 5 MG tablet, Take 1 tablet (5 mg total) by mouth daily., Disp: 90 tablet,  Rfl: 1 .  metoprolol tartrate (LOPRESSOR) 25 MG tablet, Take 1 tablet (25 mg total) by mouth daily., Disp: 90 tablet, Rfl: 1 .  pravastatin (PRAVACHOL) 40 MG tablet, Take 1 tablet (40 mg total) by mouth daily with lunch., Disp: 90 tablet, Rfl: 1 .  tamsulosin (FLOMAX) 0.4 MG CAPS capsule, Take by mouth., Disp: , Rfl:   Allergies  Allergen Reactions  . Hydrocodone-Acetaminophen Other (See Comments)    Reaction:  Confusion/weakness/hallucinations  . Morphine And Related Other (See Comments)    Reaction:  Confusion/weakness/hallucinations  . Valsartan Rash    Past Medical History:  Diagnosis Date  . Brain tumor ([place])    x2  . Guillain-Barre syndrome ([place]) Feb 13 1986  . Hypertension   . Myocardial infarction ([place]) 1997  . Stroke [facility])     Past Surgical History:  Procedure Laterality Date  . CORONARY ANGIOPLASTY  1997   after mi  . CYSTOSCOPY WITH RETROGRADE PYELOGRAM, URETEROSCOPY AND STENT PLACEMENT Left 06/28/2014   Procedure: 1ST STAGE CYSTOSCOPY/URETEROSCOPY/STENT PLACEMENT;  Surgeon: Alexis Frock, MD;  [facility];  Service: Urology;  Laterality: Left;  . CYSTOSCOPY WITH STENT PLACEMENT Left 05/08/2014   Procedure: CYSTOSCOPY, RETROGRADE PYELOGRAM WITH LEFT URETERAL STENT PLACEMENT;  Surgeon: Alexis Frock, MD;  [facility];  Service: Urology;  Laterality: Left;  . EYE SURGERY Right may 2015   growth removed, july 2015left eye cataract removed, right eye catarct removed  . gamma kniferadiation treatment  [facility] 09 [facility]  .  HOLMIUM LASER APPLICATION Left 1/61/0960   Procedure: HOLMIUM LASER APPLICATION;  Surgeon: Alexis Frock, MD;  [facility];  Service: Urology;  Laterality: Left;  . LITHOTRIPSY  years ago  . STONE EXTRACTION WITH BASKET Left 06/28/2014   Procedure: STONE EXTRACTION WITH BASKET;  Surgeon: Alexis Frock, MD;  [facility];  Service: Urology;  Laterality: Left;    Family History  Problem Relation Age of Onset  .  Diabetes Mother   . Lung cancer Mother   . CAD Father   . Diabetes Brother   . Diabetes Sister   . Stroke Sister   . Other Maternal Grandfather        brain tumor    Social History   Socioeconomic History  . Marital status: Married    Spouse name: Not on file  . Number of children: Not on file  . Years of education: Not on file  . Highest education level: Not on file  Occupational History  . Not on file  Social Needs  . Financial resource strain: Not on file  . Food insecurity    Worry: Not on file    Inability: Not on file  . Transportation needs    Medical: Not on file    Non-medical: Not on file  Tobacco Use  . Smoking status: Never Smoker  . Smokeless tobacco: Never Used  Substance and Sexual Activity  . Alcohol use: No  . Drug use: No  . Sexual activity: Not Currently  Lifestyle  . Physical activity    Days per week: Not on file    Minutes per session: Not on file  . Stress: Not on file  Relationships  . Social Herbalist on phone: Not on file    Gets together: Not on file    Attends religious service: Not on file    Active member of club or organization: Not on file    Attends meetings of clubs or organizations: Not on file    Relationship status: Not on file  . Intimate partner violence    Fear of current or ex partner: Not on file    Emotionally abused: Not on file    Physically abused: Not on file    Forced sexual activity: Not on file  Other Topics Concern  . Not on file  Social History Narrative   Lives with wife    Objective:   Today's Vitals: BP (!) 156/70 Comment: Manual  Pulse (!) 50   Temp 97.8 F (36.6 C) (Oral)   Physical Exam Vitals signs reviewed.  Constitutional:      General: He is not in acute distress.    Appearance: Normal appearance. He is normal weight. He is not ill-appearing, toxic-appearing or diaphoretic.  HENT:     Head: Normocephalic and atraumatic.  Eyes:     General: No scleral icterus.       Right  eye: No discharge.        Left eye: No discharge.     Conjunctiva/sclera: Conjunctivae normal.  Neck:     Musculoskeletal: Normal range of motion.  Cardiovascular:     Rate and Rhythm: Regular rhythm. Bradycardia present.     Heart sounds: Normal heart sounds. No murmur. No friction rub. No gallop.   Pulmonary:     Effort: Pulmonary effort is normal. No respiratory distress.     Breath sounds: Normal breath sounds. No stridor. No wheezing, rhonchi or rales.  Musculoskeletal:     Comments:  Paralysis from waste down.  Skin:    General: Skin is warm and dry.  Neurological:     Mental Status: He is alert and oriented to person, place, and time. Mental status is at baseline.  Psychiatric:        Mood and Affect: Mood normal.        Behavior: Behavior normal.        Thought Content: Thought content normal.        Judgment: Judgment normal.     Assessment & Plan:   1. Essential (primary) hypertension - Elevated today. Patient is going to monitor BP at home and let me know if consistently > 150/90. Education provided on the DASH diet.  - amLODipine (NORVASC) 2.5 MG tablet; Take 1 tablet (2.5 mg total) by mouth daily.  Dispense: 90 tablet; Refill: 1 - lisinopril (ZESTRIL) 5 MG tablet; Take 1 tablet (5 mg total) by mouth daily.  Dispense: 90 tablet; Refill: 1 - metoprolol tartrate (LOPRESSOR) 25 MG tablet; Take 1 tablet (25 mg total) by mouth daily.  Dispense: 90 tablet; Refill: 1  2. Hyperlipidemia, unspecified hyperlipidemia type - Controlled on current regimen.  - pravastatin (PRAVACHOL) 40 MG tablet; Take 1 tablet (40 mg total) by mouth daily with lunch.  Dispense: 90 tablet; Refill: 1  3. TIA (transient ischemic attack) - Controlled on current regimen.  - clopidogrel (PLAVIX) 75 MG tablet; Take 1 tablet (75 mg total) by mouth daily.  Dispense: 90 tablet; Refill: 1  4. Guillain Barr syndrome ([place]) - Controlled on current regimen. - gabapentin (NEURONTIN) 300 MG capsule; Take 1  capsule (300 mg total) by mouth 3 (three) times daily.  Dispense: 270 capsule; Refill: 1  5. Paraplegia, incomplete ([place]) - Patient doing well. He has a power chair at home that allows him to get outside and work in the yard as he desires.   6. Anxiety - Controlled on current regimen.  - escitalopram (LEXAPRO) 10 MG tablet; Take 1 tablet (10 mg total) by mouth at bedtime.  Dispense: 90 tablet; Refill: 1  7. Meningioma [facility]) - Managed by neurosurgeon, Dr. Arlan Organ.   8. Encounter to establish care   Follow-up: Return in about 3 months (around 01/15/2019) for follow-up of chronic medication conditions.   Loman Brooklyn, FNP

## 2018-10-16 DIAGNOSIS — G8222 Paraplegia, incomplete: Secondary | ICD-10-CM | POA: Insufficient documentation

## 2018-11-11 DIAGNOSIS — Z0120 Encounter for dental examination and cleaning without abnormal findings: Secondary | ICD-10-CM | POA: Diagnosis not present

## 2019-01-12 ENCOUNTER — Other Ambulatory Visit: Payer: Self-pay | Admitting: Family Medicine

## 2019-01-12 DIAGNOSIS — G610 Guillain-Barre syndrome: Secondary | ICD-10-CM

## 2019-01-14 ENCOUNTER — Other Ambulatory Visit: Payer: Self-pay

## 2019-01-15 ENCOUNTER — Encounter: Payer: Self-pay | Admitting: Family Medicine

## 2019-01-15 ENCOUNTER — Ambulatory Visit (INDEPENDENT_AMBULATORY_CARE_PROVIDER_SITE_OTHER): Payer: Medicare Other | Admitting: Family Medicine

## 2019-01-15 VITALS — BP 154/70 | HR 80 | Temp 99.6°F | Ht 67.0 in

## 2019-01-15 DIAGNOSIS — Z23 Encounter for immunization: Secondary | ICD-10-CM

## 2019-01-15 DIAGNOSIS — D649 Anemia, unspecified: Secondary | ICD-10-CM

## 2019-01-15 DIAGNOSIS — D329 Benign neoplasm of meninges, unspecified: Secondary | ICD-10-CM

## 2019-01-15 DIAGNOSIS — Z87442 Personal history of urinary calculi: Secondary | ICD-10-CM

## 2019-01-15 DIAGNOSIS — I10 Essential (primary) hypertension: Secondary | ICD-10-CM | POA: Diagnosis not present

## 2019-01-15 MED ORDER — AMLODIPINE BESYLATE 5 MG PO TABS: 5.0000 mg | ORAL_TABLET | Freq: Every day | ORAL | 1 refills | Status: DC

## 2019-01-15 NOTE — Progress Notes (Addendum)
Assessment & Plan:  1. Essential (primary) hypertension - Amlodipine increased from 2.5 mg to 5 mg QD. Heart healthy diet. Patient to keep a log of BP readings and bring to next appointment.  - CMP14+EGFR - amLODipine (NORVASC) 5 MG tablet; Take 1 tablet (5 mg total) by mouth daily.  Dispense: 90 tablet; Refill: 1  2. Meningioma [facility]) - Managed by Dr. Arlan Organ.  3. Anemia, unspecified type - CBC with Differential/Platelet  4. History of kidney stones - Managed by Dr. Tresa Moore.    Return in about 3 months (around 04/17/2019) for follow-up of chronic medication conditions.  [name], MSN, APRN, FNP-C [facility]  Subjective:    Patient ID: Theodore Mendoza, male    DOB: 10/18/1940, 78 y.o.   MRN: [id number]  Patient Care Team: Loman Brooklyn, FNP as PCP - General (Family Medicine) Loretta Plume as Consulting Physician (Neurosurgery) Alexis Frock, MD as Consulting Physician (Urology) Dudley Major, MD as Consulting Physician (Cardiology)   Chief Complaint:  Chief Complaint  Patient presents with  . Hypertension    3 month     HPI: Theodore Mendoza is a 78 y.o. male presenting on 01/15/2019 for Hypertension (3 month )  Hypertension: Patient here for follow-up of elevated blood pressure. He is exercising and is adherent to low salt diet.  Blood pressure is not well controlled at home. Cardiac symptoms none. Cardiovascular risk factors: advanced age (older than 60 for men, 76 for women), hypertension, male gender and sedentary lifestyle. Use of agents associated with hypertension: none. History of target organ damage: angina/ prior myocardial infarction and stroke.  He is going to Dr. Tresa Moore (urologist) due to history of kidney stones later this month on 01/30/2019.   Patient has a MRI once a year with Dr. Arlan Organ due to meningioma.   Dr. Metta Clines is his cardiologist at [facility] that he sees once yearly due to history of a heart attack in 2007. He  has had a coronary angioplasty and has not had any further issues.   New complaints: None  Social history:  Relevant past medical, surgical, family and social history reviewed and updated as indicated. Interim medical history since our last visit reviewed.  Allergies and medications reviewed and updated.  DATA REVIEWED: CHART IN EPIC  ROS: Negative unless specifically indicated above in HPI.    Current Outpatient Medications:  .  amLODipine (NORVASC) 5 MG tablet, Take 1 tablet (5 mg total) by mouth daily., Disp: 90 tablet, Rfl: 1 .  clopidogrel (PLAVIX) 75 MG tablet, Take 1 tablet (75 mg total) by mouth daily., Disp: 90 tablet, Rfl: 1 .  escitalopram (LEXAPRO) 10 MG tablet, Take 1 tablet (10 mg total) by mouth at bedtime., Disp: 90 tablet, Rfl: 1 .  Ferrous Sulfate (IRON) 325 (65 Fe) MG TABS, Take by mouth., Disp: , Rfl:  .  gabapentin (NEURONTIN) 300 MG capsule, Take 1 capsule (300 mg total) by mouth 3 (three) times daily., Disp: 270 capsule, Rfl: 0 .  lisinopril (ZESTRIL) 5 MG tablet, Take 1 tablet (5 mg total) by mouth daily., Disp: 90 tablet, Rfl: 1 .  metoprolol tartrate (LOPRESSOR) 25 MG tablet, Take 1 tablet (25 mg total) by mouth daily., Disp: 90 tablet, Rfl: 1 .  pravastatin (PRAVACHOL) 40 MG tablet, Take 1 tablet (40 mg total) by mouth daily with lunch., Disp: 90 tablet, Rfl: 1 .  tamsulosin (FLOMAX) 0.4 MG CAPS capsule, Take by mouth., Disp: , Rfl:    Allergies  Allergen Reactions  . Hydrocodone-Acetaminophen Other (See Comments)    Reaction:  Confusion/weakness/hallucinations  . Morphine And Related Other (See Comments)    Reaction:  Confusion/weakness/hallucinations  . Valsartan Rash   Past Medical History:  Diagnosis Date  . Brain tumor ([place])    x2  . Guillain Barr syndrome ([place])   . Guillain-Barre syndrome ([place]) Feb 13 1986  . Hypertension   . Myocardial infarction ([place]) 1997  . Stroke [facility])     Past Surgical History:  Procedure Laterality Date  .  CORONARY ANGIOPLASTY  1997   after mi  . CYSTOSCOPY WITH RETROGRADE PYELOGRAM, URETEROSCOPY AND STENT PLACEMENT Left 06/28/2014   Procedure: 1ST STAGE CYSTOSCOPY/URETEROSCOPY/STENT PLACEMENT;  Surgeon: Alexis Frock, MD;  Location: [facility];  Service: Urology;  Laterality: Left;  . CYSTOSCOPY WITH STENT PLACEMENT Left 05/08/2014   Procedure: CYSTOSCOPY, RETROGRADE PYELOGRAM WITH LEFT URETERAL STENT PLACEMENT;  Surgeon: Alexis Frock, MD;  Location: [facility];  Service: Urology;  Laterality: Left;  . EYE SURGERY Right may 2015   growth removed, july 2015left eye cataract removed, right eye catarct removed  . gamma kniferadiation treatment  [facility] 09 [facility]  . HOLMIUM LASER APPLICATION Left 8/56/3149   Procedure: HOLMIUM LASER APPLICATION;  Surgeon: Alexis Frock, MD;  Location: [facility];  Service: Urology;  Laterality: Left;  . LITHOTRIPSY  years ago  . STONE EXTRACTION WITH BASKET Left 06/28/2014   Procedure: STONE EXTRACTION WITH BASKET;  Surgeon: Alexis Frock, MD;  [facility];  Service: Urology;  Laterality: Left;    Social History   Socioeconomic History  . Marital status: Married    Spouse name: Not on file  . Number of children: Not on file  . Years of education: Not on file  . Highest education level: Not on file  Occupational History  . Not on file  Social Needs  . Financial resource strain: Not on file  . Food insecurity    Worry: Not on file    Inability: Not on file  . Transportation needs    Medical: Not on file    Non-medical: Not on file  Tobacco Use  . Smoking status: Never Smoker  . Smokeless tobacco: Never Used  Substance and Sexual Activity  . Alcohol use: No  . Drug use: No  . Sexual activity: Not Currently  Lifestyle  . Physical activity    Days per week: Not on file    Minutes per session: Not on file  . Stress: Not on file  Relationships  . Social Herbalist on phone: Not on file    Gets together: Not on file     Attends religious service: Not on file    Active member of club or organization: Not on file    Attends meetings of clubs or organizations: Not on file    Relationship status: Not on file  . Intimate partner violence    Fear of current or ex partner: Not on file    Emotionally abused: Not on file    Physically abused: Not on file    Forced sexual activity: Not on file  Other Topics Concern  . Not on file  Social History Narrative   Lives with wife        Objective:    BP (!) 154/70   Pulse 80   Temp 99.6 F (37.6 C) (Temporal)   Ht 5' 7"  (1.702 m)   SpO2 98%   BMI 27.10  kg/m   Physical Exam Vitals signs reviewed.  Constitutional:      General: He is not in acute distress.    Appearance: Normal appearance. He is overweight. He is not ill-appearing, toxic-appearing or diaphoretic.  HENT:     Head: Normocephalic and atraumatic.  Eyes:     General: No scleral icterus.       Right eye: No discharge.        Left eye: No discharge.     Conjunctiva/sclera: Conjunctivae normal.  Neck:     Musculoskeletal: Normal range of motion.  Cardiovascular:     Rate and Rhythm: Normal rate and regular rhythm.     Heart sounds: Normal heart sounds. No murmur. No friction rub. No gallop.   Pulmonary:     Effort: Pulmonary effort is normal. No respiratory distress.     Breath sounds: Normal breath sounds. No stridor. No wheezing, rhonchi or rales.  Musculoskeletal: Normal range of motion.     Comments: Paralysis waist down.  Skin:    General: Skin is warm and dry.  Neurological:     Mental Status: He is alert and oriented to person, place, and time. Mental status is at baseline.  Psychiatric:        Mood and Affect: Mood normal.        Behavior: Behavior normal.        Thought Content: Thought content normal.        Judgment: Judgment normal.    Lab Results  Component Value Date   WBC 6.5 01/15/2019   HGB 12.6 (L) 01/15/2019   HCT 39.4 01/15/2019   MCV 90 01/15/2019   PLT  172 01/15/2019   Lab Results  Component Value Date   NA 146 (H) 01/15/2019   K 4.6 01/15/2019   CO2 30 (H) 01/15/2019   GLUCOSE 86 01/15/2019   BUN 16 01/15/2019   CREATININE 0.65 (L) 01/15/2019   BILITOT 0.6 01/15/2019   ALKPHOS 73 01/15/2019   AST 14 01/15/2019   ALT 9 01/15/2019   PROT 5.8 (L) 01/15/2019   ALBUMIN 3.9 01/15/2019   CALCIUM 8.9 01/15/2019   ANIONGAP 7 11/10/2015   Lab Results  Component Value Date   CHOL 118 10/02/2015   Lab Results  Component Value Date   HDL 37 (L) 10/02/2015   Lab Results  Component Value Date   LDLCALC 73 10/02/2015   Lab Results  Component Value Date   TRIG 39 10/02/2015   Lab Results  Component Value Date   CHOLHDL 3.2 10/02/2015   Lab Results  Component Value Date   HGBA1C 5.3 10/02/2015

## 2019-01-16 ENCOUNTER — Encounter: Payer: Self-pay | Admitting: Family Medicine

## 2019-01-16 LAB — CMP14+EGFR
ALT: 9 IU/L (ref 0–44)
AST: 14 IU/L (ref 0–40)
Albumin/Globulin Ratio: 2.1 (ref 1.2–2.2)
Albumin: 3.9 g/dL (ref 3.7–4.7)
Alkaline Phosphatase: 73 IU/L (ref 39–117)
BUN/Creatinine Ratio: 25 — ABNORMAL HIGH (ref 10–24)
BUN: 16 mg/dL (ref 8–27)
Bilirubin Total: 0.6 mg/dL (ref 0.0–1.2)
CO2: 30 mmol/L — ABNORMAL HIGH (ref 20–29)
Calcium: 8.9 mg/dL (ref 8.6–10.2)
Chloride: 107 mmol/L — ABNORMAL HIGH (ref 96–106)
Creatinine, Ser: 0.65 mg/dL — ABNORMAL LOW (ref 0.76–1.27)
GFR calc Af Amer: 106 mL/min/{1.73_m2} (ref >59)
GFR calc non Af Amer: 92 mL/min/{1.73_m2} (ref >59)
Globulin, Total: 1.9 g/dL (ref 1.5–4.5)
Glucose: 86 mg/dL (ref 65–99)
Potassium: 4.6 mmol/L (ref 3.5–5.2)
Sodium: 146 mmol/L — ABNORMAL HIGH (ref 134–144)
Total Protein: 5.8 g/dL — ABNORMAL LOW (ref 6.0–8.5)

## 2019-01-16 LAB — CBC WITH DIFFERENTIAL/PLATELET
Basophils Absolute: 0.1 10*3/uL (ref 0.0–0.2)
Basos: 1 %
EOS (ABSOLUTE): 0.3 10*3/uL (ref 0.0–0.4)
Eos: 4 %
Hematocrit: 39.4 % (ref 37.5–51.0)
Hemoglobin: 12.6 g/dL — ABNORMAL LOW (ref 13.0–17.7)
Immature Grans (Abs): 0 10*3/uL (ref 0.0–0.1)
Immature Granulocytes: 0 %
Lymphocytes Absolute: 1.6 10*3/uL (ref 0.7–3.1)
Lymphs: 25 %
MCH: 28.6 pg (ref 26.6–33.0)
MCHC: 32 g/dL (ref 31.5–35.7)
MCV: 90 fL (ref 79–97)
Monocytes Absolute: 0.3 10*3/uL (ref 0.1–0.9)
Monocytes: 5 %
Neutrophils Absolute: 4.2 10*3/uL (ref 1.4–7.0)
Neutrophils: 65 %
Platelets: 172 10*3/uL (ref 150–450)
RBC: 4.4 x10E6/uL (ref 4.14–5.80)
RDW: 13.6 % (ref 11.6–15.4)
WBC: 6.5 10*3/uL (ref 3.4–10.8)

## 2019-03-02 DIAGNOSIS — R3915 Urgency of urination: Secondary | ICD-10-CM | POA: Diagnosis not present

## 2019-03-02 DIAGNOSIS — N281 Cyst of kidney, acquired: Secondary | ICD-10-CM | POA: Diagnosis not present

## 2019-03-02 DIAGNOSIS — N480 Leukoplakia of penis: Secondary | ICD-10-CM | POA: Diagnosis not present

## 2019-03-02 DIAGNOSIS — N202 Calculus of kidney with calculus of ureter: Secondary | ICD-10-CM | POA: Diagnosis not present

## 2019-03-12 DIAGNOSIS — E7800 Pure hypercholesterolemia, unspecified: Secondary | ICD-10-CM | POA: Diagnosis not present

## 2019-03-12 DIAGNOSIS — Z79899 Other long term (current) drug therapy: Secondary | ICD-10-CM | POA: Diagnosis not present

## 2019-03-12 DIAGNOSIS — I4891 Unspecified atrial fibrillation: Secondary | ICD-10-CM | POA: Diagnosis not present

## 2019-03-12 DIAGNOSIS — I2510 Atherosclerotic heart disease of native coronary artery without angina pectoris: Secondary | ICD-10-CM | POA: Diagnosis not present

## 2019-03-12 DIAGNOSIS — Z7901 Long term (current) use of anticoagulants: Secondary | ICD-10-CM | POA: Diagnosis not present

## 2019-04-06 ENCOUNTER — Other Ambulatory Visit: Payer: Self-pay | Admitting: Family Medicine

## 2019-04-06 DIAGNOSIS — G459 Transient cerebral ischemic attack, unspecified: Secondary | ICD-10-CM

## 2019-04-18 NOTE — Progress Notes (Signed)
Assessment & Plan:  1. Essential (primary) hypertension - Well controlled on current regimen.  - CMP14+EGFR  2. Atrial fibrillation, unspecified type ([place]) - Recent diagnosis per patient.  Managed by cardiologist.  Explained the dangers of the abnormal rhythm and the cardioversion as patient was very nervous and scared. - CBC with Differential/Platelet  3. Current use of anticoagulant therapy - CBC with Differential/Platelet  4. Hyperlipidemia, unspecified hyperlipidemia type - Well controlled on current regimen.   COVID-19: Patient does not wish to get the COVID-19 vaccine due to his history of Guillain Barr syndrome.  Information provided on shingles so that patient has information to help him decide if he would like Shingrix.   Return in about 3 months (around 07/20/2019) for follow-up of chronic medication conditions.  [name], MSN, APRN, FNP-C [facility]  Subjective:    Patient ID: Theodore Mendoza, male    DOB: 03/14/1941, 79 y.o.   MRN: [id number]  Patient Care Team: Loman Brooklyn, FNP as PCP - General (Family Medicine) Loretta Plume as Consulting Physician (Neurosurgery) Alexis Frock, MD as Consulting Physician (Urology) Dudley Major, MD as Consulting Physician (Cardiology)   Chief Complaint:  Chief Complaint  Patient presents with  . Medical Management of Chronic Issues    HPI: Theodore Mendoza is a 79 y.o. male presenting on 04/21/2019 for Medical Management of Chronic Issues  Hypertension: Patient here for follow-up of elevated blood pressure. He is exercising and is adherent to low salt diet.  Blood pressure is well controlled at home. Cardiac symptoms none. Cardiovascular risk factors: advanced age (older than 12 for men, 5 for women), hypertension, male gender and sedentary lifestyle. Use of agents associated with hypertension: none. History of target organ damage: angina/ prior myocardial infarction and  stroke.   New complaints: Patient saw his cardiologist, Dr. Metta Clines, last month (03/12/2019) and was started on Eliquis 5 mg twice daily due to the new rhythm of atrial fibrillation.  The plan is for him to remain on anticoagulation for 30 to 60 days and if he remains in atrial fibrillation he will undergo cardioversion.   Social history:  Relevant past medical, surgical, family and social history reviewed and updated as indicated. Interim medical history since our last visit reviewed.  Allergies and medications reviewed and updated.  DATA REVIEWED: CHART IN EPIC  ROS: Negative unless specifically indicated above in HPI.    Current Outpatient Medications:  .  amLODipine (NORVASC) 5 MG tablet, Take 1 tablet (5 mg total) by mouth daily., Disp: 90 tablet, Rfl: 1 .  apixaban (ELIQUIS) 5 MG TABS tablet, Take 5 mg by mouth 2 (two) times daily., Disp: , Rfl:  .  clopidogrel (PLAVIX) 75 MG tablet, Take 1 tablet (75 mg total) by mouth daily., Disp: 90 tablet, Rfl: 0 .  escitalopram (LEXAPRO) 10 MG tablet, Take 1 tablet (10 mg total) by mouth at bedtime., Disp: 90 tablet, Rfl: 1 .  Ferrous Sulfate (IRON) 325 (65 Fe) MG TABS, Take by mouth., Disp: , Rfl:  .  gabapentin (NEURONTIN) 300 MG capsule, Take 1 capsule (300 mg total) by mouth 3 (three) times daily., Disp: 270 capsule, Rfl: 0 .  lisinopril (ZESTRIL) 5 MG tablet, Take 1 tablet (5 mg total) by mouth daily., Disp: 90 tablet, Rfl: 1 .  metoprolol tartrate (LOPRESSOR) 25 MG tablet, Take 1 tablet (25 mg total) by mouth daily., Disp: 90 tablet, Rfl: 1 .  pravastatin (PRAVACHOL) 40 MG tablet, Take 1 tablet (40 mg  total) by mouth daily with lunch., Disp: 90 tablet, Rfl: 1 .  tamsulosin (FLOMAX) 0.4 MG CAPS capsule, Take by mouth., Disp: , Rfl:    Allergies  Allergen Reactions  . Hydrocodone-Acetaminophen Other (See Comments)    Reaction:  Confusion/weakness/hallucinations  . Morphine And Related Other (See Comments)    Reaction:   Confusion/weakness/hallucinations  . Valsartan Rash   Past Medical History:  Diagnosis Date  . Brain tumor ([place])    x2  . Guillain Barr syndrome ([place])   . Guillain-Barre syndrome ([place]) Feb 13 1986  . Hypertension   . Myocardial infarction ([place]) 1997  . Stroke [facility])     Past Surgical History:  Procedure Laterality Date  . CORONARY ANGIOPLASTY  1997   after mi  . CYSTOSCOPY WITH RETROGRADE PYELOGRAM, URETEROSCOPY AND STENT PLACEMENT Left 06/28/2014   Procedure: 1ST STAGE CYSTOSCOPY/URETEROSCOPY/STENT PLACEMENT;  Surgeon: Alexis Frock, MD;  [facility];  Service: Urology;  Laterality: Left;  . CYSTOSCOPY WITH STENT PLACEMENT Left 05/08/2014   Procedure: CYSTOSCOPY, RETROGRADE PYELOGRAM WITH LEFT URETERAL STENT PLACEMENT;  Surgeon: Alexis Frock, MD;  [facility];  Service: Urology;  Laterality: Left;  . EYE SURGERY Right may 2015   growth removed, july 2015left eye cataract removed, right eye catarct removed  . gamma kniferadiation treatment  [facility] 09 2014   [facility]  . HOLMIUM LASER APPLICATION Left 1/61/0960   Procedure: HOLMIUM LASER APPLICATION;  Surgeon: Alexis Frock, MD;  [facility];  Service: Urology;  Laterality: Left;  . LITHOTRIPSY  years ago  . STONE EXTRACTION WITH BASKET Left 06/28/2014   Procedure: STONE EXTRACTION WITH BASKET;  Surgeon: Alexis Frock, MD;  [facility];  Service: Urology;  Laterality: Left;    Social History   Socioeconomic History  . Marital status: Married    Spouse name: Not on file  . Number of children: Not on file  . Years of education: Not on file  . Highest education level: Not on file  Occupational History  . Not on file  Tobacco Use  . Smoking status: Never Smoker  . Smokeless tobacco: Never Used  Substance and Sexual Activity  . Alcohol use: No  . Drug use: No  . Sexual activity: Not Currently  Other Topics Concern  . Not on file  Social History Narrative   Lives with wife   Social  Determinants of Health   Financial Resource Strain:   . Difficulty of Paying Living Expenses: Not on file  Food Insecurity:   . Worried About Charity fundraiser in the Last Year: Not on file  . Ran Out of Food in the Last Year: Not on file  Transportation Needs:   . Lack of Transportation (Medical): Not on file  . Lack of Transportation (Non-Medical): Not on file  Physical Activity:   . Days of Exercise per Week: Not on file  . Minutes of Exercise per Session: Not on file  Stress:   . Feeling of Stress : Not on file  Social Connections:   . Frequency of Communication with Friends and Family: Not on file  . Frequency of Social Gatherings with Friends and Family: Not on file  . Attends Religious Services: Not on file  . Active Member of Clubs or Organizations: Not on file  . Attends Archivist Meetings: Not on file  . Marital Status: Not on file  Intimate Partner Violence:   . Fear of Current or Ex-Partner: Not on file  .  Emotionally Abused: Not on file  . Physically Abused: Not on file  . Sexually Abused: Not on file        Objective:    BP 112/72   Pulse 75   Temp (!) 97.5 F (36.4 C) (Temporal)   Ht 5' 7"  (1.702 m)   SpO2 97%   BMI 27.10 kg/m   Physical Exam Vitals reviewed.  Constitutional:      General: He is not in acute distress.    Appearance: Normal appearance. He is overweight. He is not ill-appearing, toxic-appearing or diaphoretic.  HENT:     Head: Normocephalic and atraumatic.  Eyes:     General: No scleral icterus.       Right eye: No discharge.        Left eye: No discharge.     Conjunctiva/sclera: Conjunctivae normal.  Cardiovascular:     Rate and Rhythm: Normal rate. Rhythm irregularly irregular.     Heart sounds: Normal heart sounds. No murmur. No friction rub. No gallop.   Pulmonary:     Effort: Pulmonary effort is normal. No respiratory distress.     Breath sounds: Normal breath sounds. No stridor. No wheezing, rhonchi or rales.    Musculoskeletal:        General: Normal range of motion.     Cervical back: Normal range of motion.     Comments: Paralysis waist down.  Skin:    General: Skin is warm and dry.  Neurological:     Mental Status: He is alert and oriented to person, place, and time. Mental status is at baseline.     Gait: Gait abnormal (rides in wheelchair due to paralysis).  Psychiatric:        Mood and Affect: Mood normal.        Behavior: Behavior normal.        Thought Content: Thought content normal.        Judgment: Judgment normal.     No results found for: TSH Lab Results  Component Value Date   WBC 5.3 04/21/2019   HGB 11.4 (L) 04/21/2019   HCT 35.8 (L) 04/21/2019   MCV 90 04/21/2019   PLT 220 04/21/2019   Lab Results  Component Value Date   NA 145 (H) 04/21/2019   K 4.6 04/21/2019   CO2 27 04/21/2019   GLUCOSE 90 04/21/2019   BUN 16 04/21/2019   CREATININE 0.77 04/21/2019   BILITOT 0.6 04/21/2019   ALKPHOS 66 04/21/2019   AST 11 04/21/2019   ALT 6 04/21/2019   PROT 5.5 (L) 04/21/2019   ALBUMIN 3.8 04/21/2019   CALCIUM 9.1 04/21/2019   ANIONGAP 7 11/10/2015   Lab Results  Component Value Date   CHOL 118 10/02/2015   Lab Results  Component Value Date   HDL 37 (L) 10/02/2015   Lab Results  Component Value Date   LDLCALC 73 10/02/2015   Lab Results  Component Value Date   TRIG 39 10/02/2015   Lab Results  Component Value Date   CHOLHDL 3.2 10/02/2015   Lab Results  Component Value Date   HGBA1C 5.3 10/02/2015

## 2019-04-20 ENCOUNTER — Other Ambulatory Visit: Payer: Self-pay

## 2019-04-21 ENCOUNTER — Ambulatory Visit (INDEPENDENT_AMBULATORY_CARE_PROVIDER_SITE_OTHER): Payer: Medicare Other | Admitting: Family Medicine

## 2019-04-21 ENCOUNTER — Encounter: Payer: Self-pay | Admitting: Family Medicine

## 2019-04-21 VITALS — BP 112/72 | HR 75 | Temp 97.5°F | Ht 67.0 in

## 2019-04-21 DIAGNOSIS — I10 Essential (primary) hypertension: Secondary | ICD-10-CM

## 2019-04-21 DIAGNOSIS — E785 Hyperlipidemia, unspecified: Secondary | ICD-10-CM

## 2019-04-21 DIAGNOSIS — Z7901 Long term (current) use of anticoagulants: Secondary | ICD-10-CM

## 2019-04-21 DIAGNOSIS — I4891 Unspecified atrial fibrillation: Secondary | ICD-10-CM | POA: Diagnosis not present

## 2019-04-21 DIAGNOSIS — I4819 Other persistent atrial fibrillation: Secondary | ICD-10-CM | POA: Insufficient documentation

## 2019-04-21 LAB — CBC WITH DIFFERENTIAL/PLATELET
Basophils Absolute: 0.1 10*3/uL (ref 0.0–0.2)
Basos: 1 %
EOS (ABSOLUTE): 0.3 10*3/uL (ref 0.0–0.4)
Eos: 5 %
Hematocrit: 35.8 % — ABNORMAL LOW (ref 37.5–51.0)
Hemoglobin: 11.4 g/dL — ABNORMAL LOW (ref 13.0–17.7)
Immature Grans (Abs): 0 10*3/uL (ref 0.0–0.1)
Immature Granulocytes: 0 %
Lymphocytes Absolute: 1.5 10*3/uL (ref 0.7–3.1)
Lymphs: 28 %
MCH: 28.5 pg (ref 26.6–33.0)
MCHC: 31.8 g/dL (ref 31.5–35.7)
MCV: 90 fL (ref 79–97)
Monocytes Absolute: 0.3 10*3/uL (ref 0.1–0.9)
Monocytes: 6 %
Neutrophils Absolute: 3.2 10*3/uL (ref 1.4–7.0)
Neutrophils: 60 %
Platelets: 220 10*3/uL (ref 150–450)
RBC: 4 x10E6/uL — ABNORMAL LOW (ref 4.14–5.80)
RDW: 12.6 % (ref 11.6–15.4)
WBC: 5.3 10*3/uL (ref 3.4–10.8)

## 2019-04-21 LAB — CMP14+EGFR
ALT: 6 IU/L (ref 0–44)
AST: 11 IU/L (ref 0–40)
Albumin/Globulin Ratio: 2.2 (ref 1.2–2.2)
Albumin: 3.8 g/dL (ref 3.7–4.7)
Alkaline Phosphatase: 66 IU/L (ref 39–117)
BUN/Creatinine Ratio: 21 (ref 10–24)
BUN: 16 mg/dL (ref 8–27)
Bilirubin Total: 0.6 mg/dL (ref 0.0–1.2)
CO2: 27 mmol/L (ref 20–29)
Calcium: 9.1 mg/dL (ref 8.6–10.2)
Chloride: 107 mmol/L — ABNORMAL HIGH (ref 96–106)
Creatinine, Ser: 0.77 mg/dL (ref 0.76–1.27)
GFR calc Af Amer: 99 mL/min/{1.73_m2} (ref >59)
GFR calc non Af Amer: 86 mL/min/{1.73_m2} (ref >59)
Globulin, Total: 1.7 g/dL (ref 1.5–4.5)
Glucose: 90 mg/dL (ref 65–99)
Potassium: 4.6 mmol/L (ref 3.5–5.2)
Sodium: 145 mmol/L — ABNORMAL HIGH (ref 134–144)
Total Protein: 5.5 g/dL — ABNORMAL LOW (ref 6.0–8.5)

## 2019-04-21 NOTE — Patient Instructions (Signed)
Shingles  Shingles is an infection. It gives you a painful skin rash and blisters that have fluid in them. Shingles is caused by the same germ (virus) that causes chickenpox. Shingles only happens in people who:  Have had chickenpox.  Have been given a shot of medicine (vaccine) to protect against chickenpox. Shingles is rare in this group. The first symptoms of shingles may be itching, tingling, or pain in an area on your skin. A rash will show on your skin a few days or weeks later. The rash is likely to be on one side of your body. The rash usually has a shape like a belt or a band. Over time, the rash turns into fluid-filled blisters. The blisters will break open, change into scabs, and dry up. Medicines may:  Help with pain and itching.  Help you get better sooner.  Help to prevent long-term problems. Follow these instructions at home: Medicines  Take over-the-counter and prescription medicines only as told by your doctor.  Put on an anti-itch cream or numbing cream where you have a rash, blisters, or scabs. Do this as told by your doctor. Helping with itching and discomfort   Put cold, wet cloths (cold compresses) on the area of the rash or blisters as told by your doctor.  Cool baths can help you feel better. Try adding baking soda or dry oatmeal to the water to lessen itching. Do not bathe in hot water. Blister and rash care  Keep your rash covered with a loose bandage (dressing).  Wear loose clothing that does not rub on your rash.  Keep your rash and blisters clean. To do this, wash the area with mild soap and cool water as told by your doctor.  Check your rash every day for signs of infection. Check for: ? More redness, swelling, or pain. ? Fluid or blood. ? Warmth. ? Pus or a bad smell.  Do not scratch your rash. Do not pick at your blisters. To help you to not scratch: ? Keep your fingernails clean and cut short. ? Wear gloves or mittens when you sleep, if  scratching is a problem. General instructions  Rest as told by your doctor.  Keep all follow-up visits as told by your doctor. This is important.  Wash your hands often with soap and water. If soap and water are not available, use hand sanitizer. Doing this lowers your chance of getting a skin infection caused by germs (bacteria).  Your infection can cause chickenpox in people who have never had chickenpox or never got a shot of chickenpox vaccine. If you have blisters that did not change into scabs yet, try not to touch other people or be around other people, especially: ? Babies. ? Pregnant women. ? Children who have areas of red, itchy, or rough skin (eczema). ? Very old people who have transplants. ? People who have a long-term (chronic) sickness, like cancer or AIDS. Contact a doctor if:  Your pain does not get better with medicine.  Your pain does not get better after the rash heals.  You have any signs of infection in the rash area. These signs include: ? More redness, swelling, or pain around the rash. ? Fluid or blood coming from the rash. ? The rash area feeling warm to the touch. ? Pus or a bad smell coming from the rash. Get help right away if:  The rash is on your face or nose.  You have pain in your face or pain by   your eye.  You lose feeling on one side of your face.  You have trouble seeing.  You have ear pain, or you have ringing in your ear.  You have a loss of taste.  Your condition gets worse. Summary  Shingles gives you a painful skin rash and blisters that have fluid in them.  Shingles is an infection. It is caused by the same germ (virus) that causes chickenpox.  Keep your rash covered with a loose bandage (dressing). Wear loose clothing that does not rub on your rash.  If you have blisters that did not change into scabs yet, try not to touch other people or be around people. This information is not intended to replace advice given to you by  your health care provider. Make sure you discuss any questions you have with your health care provider. Document Revised: 07/11/2018 Document Reviewed: 11/21/2016 Elsevier Patient Education  2020 [employer]  

## 2019-04-22 ENCOUNTER — Encounter: Payer: Self-pay | Admitting: Family Medicine

## 2019-05-14 DIAGNOSIS — I4891 Unspecified atrial fibrillation: Secondary | ICD-10-CM | POA: Diagnosis not present

## 2019-05-14 DIAGNOSIS — R9431 Abnormal electrocardiogram [ECG] [EKG]: Secondary | ICD-10-CM | POA: Diagnosis not present

## 2019-05-14 DIAGNOSIS — I2510 Atherosclerotic heart disease of native coronary artery without angina pectoris: Secondary | ICD-10-CM | POA: Diagnosis not present

## 2019-05-14 DIAGNOSIS — I4819 Other persistent atrial fibrillation: Secondary | ICD-10-CM | POA: Diagnosis not present

## 2019-05-19 ENCOUNTER — Ambulatory Visit (INDEPENDENT_AMBULATORY_CARE_PROVIDER_SITE_OTHER): Payer: Medicare Other

## 2019-05-19 VITALS — Ht 68.0 in | Wt 178.0 lb

## 2019-05-19 DIAGNOSIS — Z0000 Encounter for general adult medical examination without abnormal findings: Secondary | ICD-10-CM

## 2019-05-19 DIAGNOSIS — Z0120 Encounter for dental examination and cleaning without abnormal findings: Secondary | ICD-10-CM | POA: Diagnosis not present

## 2019-05-19 NOTE — Progress Notes (Signed)
MEDICARE ANNUAL WELLNESS VISIT  05/19/2019  Telephone Visit Disclaimer This Medicare AWV was conducted by telephone due to national recommendations for restrictions regarding the COVID-19 Pandemic (e.g. social distancing).  I verified, using two identifiers, that I am speaking with Theodore Mendoza or their authorized healthcare agent. I discussed the limitations, risks, security, and privacy concerns of performing an evaluation and management service by telephone and the potential availability of an in-person appointment in the future. The patient expressed understanding and agreed to proceed.   Subjective:  Theodore Mendoza is a 79 y.o. male patient of Loman Brooklyn, FNP who had a Medicare Annual Wellness Visit today via telephone. Theodore Mendoza is Disabled and lives with their spouse. he has no children. he reports that he is socially active and does interact with friends/family regularly. he is moderately physically active.  Patient Care Team: Loman Brooklyn, FNP as PCP - General (Family Medicine) Loretta Plume as Consulting Physician (Neurosurgery) Alexis Frock, MD as Consulting Physician (Urology) Dudley Major, MD as Consulting Physician (Cardiology)  Advanced Directives 11/10/2015 10/23/2015 10/02/2015 10/01/2015 06/22/2014 05/08/2014  Does Patient Have a Medical Advance Directive? No No No No No No  Would patient like information on creating a medical advance directive? No - patient declined information No - patient declined information - - No - patient declined information Yes - Educational materials given    Hospital Utilization Over the Past 12 Months: # of hospitalizations or ER visits: 0 # of surgeries: 0  Review of Systems    Patient reports that his overall health is unchanged compared to last year.  Patient Reported Readings (BP, Pulse, CBG, Weight, etc) none  Pain Assessment       Current Medications & Allergies (verified) Allergies as of 05/19/2019      Reactions    Hydrocodone-acetaminophen Other (See Comments)   Reaction:  Confusion/weakness/hallucinations   Morphine And Related Other (See Comments)   Reaction:  Confusion/weakness/hallucinations   Valsartan Rash      Medication List       Accurate as of May 19, 2019 11:59 AM. If you have any questions, ask your nurse or doctor.        amLODipine 5 MG tablet Commonly known as: NORVASC Take 1 tablet (5 mg total) by mouth daily.   clopidogrel 75 MG tablet Commonly known as: PLAVIX Take 1 tablet (75 mg total) by mouth daily.   Eliquis 5 MG Tabs tablet Generic drug: apixaban Take 5 mg by mouth 2 (two) times daily.   escitalopram 10 MG tablet Commonly known as: LEXAPRO Take 1 tablet (10 mg total) by mouth at bedtime.   gabapentin 300 MG capsule Commonly known as: NEURONTIN Take 1 capsule (300 mg total) by mouth 3 (three) times daily.   Iron 325 (65 Fe) MG Tabs Take by mouth.   lisinopril 5 MG tablet Commonly known as: ZESTRIL Take 1 tablet (5 mg total) by mouth daily.   metoprolol tartrate 25 MG tablet Commonly known as: LOPRESSOR Take 1 tablet (25 mg total) by mouth daily.   pravastatin 40 MG tablet Commonly known as: PRAVACHOL Take 1 tablet (40 mg total) by mouth daily with lunch.   tamsulosin 0.4 MG Caps capsule Commonly known as: FLOMAX Take by mouth.       History (reviewed): Past Medical History:  Diagnosis Date  . Brain tumor ([place])    x2  . Guillain-Barre syndrome ([place]) Feb 13 1986  . Hypertension   . Myocardial infarction (  [place]) 1997  . Stroke [facility])    Past Surgical History:  Procedure Laterality Date  . CORONARY ANGIOPLASTY  1997   after mi  . CYSTOSCOPY WITH RETROGRADE PYELOGRAM, URETEROSCOPY AND STENT PLACEMENT Left 06/28/2014   Procedure: 1ST STAGE CYSTOSCOPY/URETEROSCOPY/STENT PLACEMENT;  Surgeon: Alexis Frock, MD;  Location: [facility];  Service: Urology;  Laterality: Left;  . CYSTOSCOPY WITH STENT PLACEMENT Left 05/08/2014   Procedure:  CYSTOSCOPY, RETROGRADE PYELOGRAM WITH LEFT URETERAL STENT PLACEMENT;  Surgeon: Alexis Frock, MD;  [facility]: [facility];  Service: Urology;  Laterality: Left;  . EYE SURGERY Right may 2015   growth removed, july 2015left eye cataract removed, right eye catarct removed  . gamma kniferadiation treatment  [facility] 09 [facility]  . HOLMIUM LASER APPLICATION Left [id number]   Procedure: HOLMIUM LASER APPLICATION;  Surgeon: Alexis Frock, MD;  [facility];  Service: Urology;  Laterality: Left;  . LITHOTRIPSY  years ago  . STONE EXTRACTION WITH BASKET Left 06/28/2014   Procedure: STONE EXTRACTION WITH BASKET;  Surgeon: Alexis Frock, MD;  [facility];  Service: Urology;  Laterality: Left;   Family History  Problem Relation Age of Onset  . Diabetes Mother   . Lung cancer Mother   . CAD Father   . Diabetes Brother   . Diabetes Sister   . Stroke Sister   . Other Maternal Grandfather        brain tumor   Social History   Socioeconomic History  . Marital status: Married    Spouse name: Not on file  . Number of children: Not on file  . Years of education: Not on file  . Highest education level: Not on file  Occupational History  . Not on file  Tobacco Use  . Smoking status: Never Smoker  . Smokeless tobacco: Never Used  Substance and Sexual Activity  . Alcohol use: No  . Drug use: No  . Sexual activity: Not Currently  Other Topics Concern  . Not on file  Social History Narrative   Lives with wife   Social Determinants of Health   Financial Resource Strain:   . Difficulty of Paying Living Expenses: Not on file  Food Insecurity:   . Worried About Charity fundraiser in the Last Year: Not on file  . Ran Out of Food in the Last Year: Not on file  Transportation Needs:   . Lack of Transportation (Medical): Not on file  . Lack of Transportation (Non-Medical): Not on file  Physical Activity:   . Days of Exercise per Week: Not on file  . Minutes of Exercise  per Session: Not on file  Stress:   . Feeling of Stress : Not on file  Social Connections:   . Frequency of Communication with Friends and Family: Not on file  . Frequency of Social Gatherings with Friends and Family: Not on file  . Attends Religious Services: Not on file  . Active Member of Clubs or Organizations: Not on file  . Attends Archivist Meetings: Not on file  . Marital Status: Not on file    Activities of Daily Living No flowsheet data found.  Patient Education/ Literacy    Exercise    Diet Patient reports consuming 3 meals a day and 2 snack(s) a day Patient reports that his primary diet is: Regular Patient reports that she does have regular access to food.   Depression Screen PHQ 2/9 Scores 04/21/2019 01/15/2019 10/15/2018  PHQ -  2 Score 0 0 0  PHQ- 9 Score 4 - -     Fall Risk Fall Risk  04/21/2019 01/15/2019 02/26/2017  Falls in the past year? 1 1 No  Number falls in past yr: 0 0 -  Injury with Fall? 1 1 -  Risk for fall due to : - Impaired balance/gait;Impaired mobility;Other (Comment) -  Follow up - Falls evaluation completed -     Objective:  Theodore Mendoza seemed alert and oriented and he participated appropriately during our telephone visit.  Blood Pressure Weight BMI  BP Readings from Last 3 Encounters:  04/21/19 112/72  01/15/19 (!) 154/70  10/15/18 (!) 156/70   Wt Readings from Last 3 Encounters:  10/23/15 173 lb (78.5 kg)  10/01/15 173 lb (78.5 kg)  06/28/14 173 lb 12 oz (78.8 kg)   BMI Readings from Last 1 Encounters:  04/21/19 27.10 kg/m    *Unable to obtain current vital signs, weight, and BMI due to telephone visit type  Hearing/Vision  . Theodore Mendoza did not seem to have difficulty with hearing/understanding during the telephone conversation . Reports that he has had a formal eye exam by an eye care professional within the past year . Reports that he has not had a formal hearing evaluation within the past year *Unable to fully  assess hearing and vision during telephone visit type  Cognitive Function: No flowsheet data found. (Normal:0-7, Significant for Dysfunction: >8)  Normal Cognitive Function Screening: Yes   Immunization & Health Maintenance Record Immunization History  Administered Date(s) Administered  . Pneumococcal Conjugate-13 01/15/2019    Health Maintenance  Topic Date Due  . TETANUS/TDAP  01/15/2020 (Originally 09/17/1959)  . PNA vac Low Risk Adult (2 of 2 - PPSV23) 01/15/2020  . INFLUENZA VACCINE  Discontinued       Assessment  This is a routine wellness examination for Theodore Mendoza.  Health Maintenance: Due or Overdue There are no preventive care reminders to display for this patient.  Theodore Mendoza does not need a referral for Community Assistance: Care Management:   no Social Work:    no Prescription Assistance:  no Nutrition/Diabetes Education:  no   Plan:  Personalized Goals Goals Addressed   None    Personalized Health Maintenance & Screening Recommendations   Pt is up to date with his Health Maintenance  Lung Cancer Screening Recommended: no (Low Dose CT Chest recommended if Age 57-80 years, 30 pack-year currently smoking OR have quit w/in past 15 years) Hepatitis C Screening recommended: no HIV Screening recommended: no  Advanced Directives: Written information was not prepared per patient's request.  Referrals & Orders No orders of the defined types were placed in this encounter.   Follow-up Plan . Follow-up with Loman Brooklyn, FNP as planned . Schedule 07/21/2019   I have personally reviewed and noted the following in the patient's chart:   . Medical and social history . Use of alcohol, tobacco or illicit drugs  . Current medications and supplements . Functional ability and status . Nutritional status . Physical activity . Advanced directives . List of other physicians . Hospitalizations, surgeries, and ER visits in previous 12  months . Vitals . Screenings to include cognitive, depression, and falls . Referrals and appointments  In addition, I have reviewed and discussed with Theodore Mendoza certain preventive protocols, quality metrics, and best practice recommendations. A written personalized care plan for preventive services as well as general preventive health recommendations is available and can be mailed  to the patient at his request.      Alphonzo Dublin, LPN  [phone]

## 2019-05-27 DIAGNOSIS — R001 Bradycardia, unspecified: Secondary | ICD-10-CM | POA: Diagnosis not present

## 2019-05-27 DIAGNOSIS — I2510 Atherosclerotic heart disease of native coronary artery without angina pectoris: Secondary | ICD-10-CM | POA: Diagnosis not present

## 2019-05-27 DIAGNOSIS — Z79899 Other long term (current) drug therapy: Secondary | ICD-10-CM | POA: Diagnosis not present

## 2019-05-27 DIAGNOSIS — E7800 Pure hypercholesterolemia, unspecified: Secondary | ICD-10-CM | POA: Diagnosis not present

## 2019-05-27 DIAGNOSIS — Z7901 Long term (current) use of anticoagulants: Secondary | ICD-10-CM | POA: Diagnosis not present

## 2019-05-27 DIAGNOSIS — I4819 Other persistent atrial fibrillation: Secondary | ICD-10-CM | POA: Diagnosis not present

## 2019-06-23 DIAGNOSIS — H52203 Unspecified astigmatism, bilateral: Secondary | ICD-10-CM | POA: Diagnosis not present

## 2019-07-03 ENCOUNTER — Other Ambulatory Visit: Payer: Self-pay | Admitting: Family Medicine

## 2019-07-03 DIAGNOSIS — G459 Transient cerebral ischemic attack, unspecified: Secondary | ICD-10-CM

## 2019-07-03 DIAGNOSIS — I10 Essential (primary) hypertension: Secondary | ICD-10-CM

## 2019-07-04 ENCOUNTER — Other Ambulatory Visit: Payer: Self-pay | Admitting: Family Medicine

## 2019-07-04 DIAGNOSIS — G610 Guillain-Barre syndrome: Secondary | ICD-10-CM

## 2019-07-21 ENCOUNTER — Ambulatory Visit: Payer: Medicare Other | Admitting: Family Medicine

## 2019-07-21 DIAGNOSIS — I480 Paroxysmal atrial fibrillation: Secondary | ICD-10-CM | POA: Diagnosis not present

## 2019-07-21 DIAGNOSIS — I10 Essential (primary) hypertension: Secondary | ICD-10-CM | POA: Diagnosis not present

## 2019-07-21 DIAGNOSIS — R001 Bradycardia, unspecified: Secondary | ICD-10-CM | POA: Diagnosis not present

## 2019-07-21 DIAGNOSIS — Z79899 Other long term (current) drug therapy: Secondary | ICD-10-CM | POA: Diagnosis not present

## 2019-07-21 DIAGNOSIS — Z9889 Other specified postprocedural states: Secondary | ICD-10-CM | POA: Diagnosis not present

## 2019-07-21 DIAGNOSIS — I2510 Atherosclerotic heart disease of native coronary artery without angina pectoris: Secondary | ICD-10-CM | POA: Diagnosis not present

## 2019-07-21 DIAGNOSIS — Z7901 Long term (current) use of anticoagulants: Secondary | ICD-10-CM | POA: Diagnosis not present

## 2019-07-22 ENCOUNTER — Other Ambulatory Visit: Payer: Self-pay

## 2019-07-22 ENCOUNTER — Encounter: Payer: Self-pay | Admitting: Family Medicine

## 2019-07-22 ENCOUNTER — Ambulatory Visit (INDEPENDENT_AMBULATORY_CARE_PROVIDER_SITE_OTHER): Payer: Medicare Other | Admitting: Family Medicine

## 2019-07-22 VITALS — BP 140/66 | HR 50 | Temp 97.8°F | Ht 68.0 in | Wt 178.0 lb

## 2019-07-22 DIAGNOSIS — G610 Guillain-Barre syndrome: Secondary | ICD-10-CM | POA: Diagnosis not present

## 2019-07-22 DIAGNOSIS — I10 Essential (primary) hypertension: Secondary | ICD-10-CM | POA: Diagnosis not present

## 2019-07-22 DIAGNOSIS — G609 Hereditary and idiopathic neuropathy, unspecified: Secondary | ICD-10-CM | POA: Diagnosis not present

## 2019-07-22 DIAGNOSIS — D649 Anemia, unspecified: Secondary | ICD-10-CM

## 2019-07-22 MED ORDER — GABAPENTIN 300 MG PO CAPS: ORAL_CAPSULE | ORAL | 1 refills | Status: DC

## 2019-07-22 NOTE — Progress Notes (Signed)
Assessment & Plan:  1. Idiopathic peripheral neuropathy - Gabapentin increased at night as this is when he has the most pain.  - CMP14+EGFR  2. Guillain Barr syndrome (HCC) - gabapentin (NEURONTIN) 300 MG capsule; Take 1 capsule (300 mg total) by mouth 2 (two) times daily AND 2 capsules (600 mg total) at bedtime.  Dispense: 360 capsule; Refill: 1 - CMP14+EGFR  3. Essential (primary) hypertension - Well controlled on current regimen.  - CMP14+EGFR  4. Anemia, unspecified type - CBC with Differential/Platelet   Return in about 3 months (around 10/21/2019) for follow-up of chronic medication conditions.  [name], MSN, APRN, FNP-C [facility]  Subjective:    Patient ID: Theodore Mendoza, male    DOB: October 01, 1940, 79 y.o.   MRN: [id number]  Patient Care Team: Loman Brooklyn, FNP as PCP - General (Family Medicine) Loretta Plume as Consulting Physician (Neurosurgery) Alexis Frock, MD as Consulting Physician (Urology) Dudley Major, MD as Consulting Physician (Cardiology)   Chief Complaint:  Chief Complaint  Patient presents with  . Medical Management of Chronic Issues  . Hypertension  . Hyperlipidemia    HPI: Theodore Mendoza is a 79 y.o. male presenting on 07/22/2019 for Medical Management of Chronic Issues, Hypertension, and Hyperlipidemia  Patient does still have a lot of pain in his legs at night that wakes him up. He is currently taking gabapentin 300 mg TID.   New complaints: None  Social history:  Relevant past medical, surgical, family and social history reviewed and updated as indicated. Interim medical history since our last visit reviewed.  Allergies and medications reviewed and updated.  DATA REVIEWED: CHART IN EPIC  ROS: Negative unless specifically indicated above in HPI.    Current Outpatient Medications:  .  amLODipine (NORVASC) 5 MG tablet, TAKE 1 TABLET BY MOUTH DAILY., Disp: 90 tablet, Rfl: 0 .  apixaban  (ELIQUIS) 5 MG TABS tablet, Take 5 mg by mouth 2 (two) times daily., Disp: , Rfl:  .  clopidogrel (PLAVIX) 75 MG tablet, TAKE 1 TABLET ONCE DAILY, Disp: 90 tablet, Rfl: 0 .  escitalopram (LEXAPRO) 10 MG tablet, Take 1 tablet (10 mg total) by mouth at bedtime., Disp: 90 tablet, Rfl: 1 .  Ferrous Sulfate (IRON) 325 (65 Fe) MG TABS, Take by mouth., Disp: , Rfl:  .  gabapentin (NEURONTIN) 300 MG capsule, Take 1 capsule (300 mg total) by mouth 2 (two) times daily AND 2 capsules (600 mg total) at bedtime., Disp: 360 capsule, Rfl: 1 .  lisinopril (ZESTRIL) 5 MG tablet, Take 1 tablet (5 mg total) by mouth daily., Disp: 90 tablet, Rfl: 1 .  metoprolol tartrate (LOPRESSOR) 25 MG tablet, Take 1 tablet (25 mg total) by mouth daily., Disp: 90 tablet, Rfl: 1 .  pravastatin (PRAVACHOL) 40 MG tablet, Take 1 tablet (40 mg total) by mouth daily with lunch., Disp: 90 tablet, Rfl: 1 .  tamsulosin (FLOMAX) 0.4 MG CAPS capsule, Take by mouth., Disp: , Rfl:    Allergies  Allergen Reactions  . Hydrocodone-Acetaminophen   . Morphine And Related Other (See Comments)    Reaction:  Confusion/weakness/hallucinations  . Valsartan Rash   Past Medical History:  Diagnosis Date  . Brain tumor ([place])    x2  . Guillain-Barre syndrome ([place]) Feb 13 1986  . Hypertension   . Myocardial infarction ([place]) 1997  . Stroke [facility])     Past Surgical History:  Procedure Laterality Date  . CORONARY ANGIOPLASTY  1997   after  mi  . CYSTOSCOPY WITH RETROGRADE PYELOGRAM, URETEROSCOPY AND STENT PLACEMENT Left 06/28/2014   Procedure: 1ST STAGE CYSTOSCOPY/URETEROSCOPY/STENT PLACEMENT;  Surgeon: Alexis Frock, MD;  [facility];  Service: Urology;  Laterality: Left;  . CYSTOSCOPY WITH STENT PLACEMENT Left 05/08/2014   Procedure: CYSTOSCOPY, RETROGRADE PYELOGRAM WITH LEFT URETERAL STENT PLACEMENT;  Surgeon: Alexis Frock, MD;  [facility];  Service: Urology;  Laterality: Left;  . EYE SURGERY Right may 2015   growth removed, july  2015left eye cataract removed, right eye catarct removed  . gamma kniferadiation treatment  [facility] 09 [facility]  . HOLMIUM LASER APPLICATION Left [id number]/76/7209   Procedure: HOLMIUM LASER APPLICATION;  Surgeon: Alexis Frock, MD;  [facility];  Service: Urology;  Laterality: Left;  . LITHOTRIPSY  years ago  . STONE EXTRACTION WITH BASKET Left 06/28/2014   Procedure: STONE EXTRACTION WITH BASKET;  Surgeon: Alexis Frock, MD;  [facility];  Service: Urology;  Laterality: Left;    Social History   Socioeconomic History  . Marital status: Married    Spouse name: Not on file  . Number of children: Not on file  . Years of education: Not on file  . Highest education level: Not on file  Occupational History  . Not on file  Tobacco Use  . Smoking status: Never Smoker  . Smokeless tobacco: Never Used  Substance and Sexual Activity  . Alcohol use: No  . Drug use: No  . Sexual activity: Not Currently  Other Topics Concern  . Not on file  Social History Narrative   Lives with wi   Social Determinants of Health   Financial Resource Strain:   . Difficulty of Paying Living Expenses:   Food Insecurity:   . Worried About Charity fundraiser in the Last Year:   . Arboriculturist in the Last Year:   Transportation Needs:   . Film/video editor (Medical):   [name] Lack of Transportation (Non-Medical):   Physical Activity:   . Days of Exercise per Week:   . Minutes of Exercise per Session:   Stress:   . Feeling of Stress :   Social Connections:   . Frequency of Communication with Friends and Family:   . Frequency of Social Gatherings with Friends and Family:   . Attends Religious Services:   . Active Member of Clubs or Organizations:   . Attends Archivist Meetings:   [name] Marital Status:   Intimate Partner Violence:   . Fear of Current or Ex-Partner:   . Emotionally Abused:   [name] Physically Abused:   . Sexually Abused:         Objective:    BP  140/66   Pulse (!) 50   Temp 97.8 F (36.6 C)   Ht _0  (1.727 m)   Wt 178 lb (80.7 kg)   SpO2 100%   BMI 27.06 kg/m   Wt Readings from Last 3 Encounters:  07/22/19 178 lb (80.7 kg)  05/19/19 178 lb (80.7 kg)  10/23/15 173 lb (78.5 kg)    Physical Exam Vitals reviewed.  Constitutional:      General: He is not in acute distress.    Appearance: Normal appearance. He is not ill-appearing, toxic-appearing or diaphoretic.  HENT:     Head: Normocephalic and atraumatic.  Eyes:     General: No scleral icterus.       Right eye: No discharge.  Left eye: No discharge.     Conjunctiva/sclera: Conjunctivae normal.  Cardiovascular:     Rate and Rhythm: Normal rate and regular rhythm.     Heart sounds: Normal heart sounds. No murmur. No friction rub. No gallop.   Pulmonary:     Effort: Pulmonary effort is normal. No respiratory distress.     Breath sounds: Normal breath sounds. No stridor. No wheezing, rhonchi or rales.  Musculoskeletal:        General: Normal range of motion.     Cervical back: Normal range of motion.  Skin:    General: Skin is warm and dry.  Neurological:     Mental Status: He is alert and oriented to person, place, and time. Mental status is at baseline.     Gait: Gait abnormal (rides in wheelchair).  Psychiatric:        Mood and Affect: Mood normal.        Behavior: Behavior normal.        Thought Content: Thought content normal.        Judgment: Judgment normal.     No results found for: TSH Lab Results  Component Value Date   WBC 5.3 04/21/2019   HGB 11.4 (L) 04/21/2019   HCT 35.8 (L) 04/21/2019   MCV 90 04/21/2019   PLT 220 04/21/2019   Lab Results  Component Value Date   NA 145 (H) 04/21/2019   K 4.6 04/21/2019   CO2 27 04/21/2019   GLUCOSE 90 04/21/2019   BUN 16 04/21/2019   CREATININE 0.77 04/21/2019   BILITOT 0.6 04/21/2019   ALKPHOS 66 04/21/2019   AST 11 04/21/2019   ALT 6 04/21/2019   PROT 5.5 (L) 04/21/2019   ALBUMIN 3.8  04/21/2019   CALCIUM 9.1 04/21/2019   ANIONGAP 7 11/10/2015   Lab Results  Component Value Date   CHOL 118 10/02/2015   Lab Results  Component Value Date   HDL 37 (L) 10/02/2015   Lab Results  Component Value Date   LDLCALC 73 10/02/2015   Lab Results  Component Value Date   TRIG 39 10/02/2015   Lab Results  Component Value Date   CHOLHDL 3.2 10/02/2015   Lab Results  Component Value Date   HGBA1C 5.3 10/02/2015

## 2019-07-27 NOTE — Progress Notes (Signed)
Per lab. Patient did not have lab drawn

## 2019-08-12 DIAGNOSIS — Z0120 Encounter for dental examination and cleaning without abnormal findings: Secondary | ICD-10-CM | POA: Diagnosis not present

## 2019-09-09 DIAGNOSIS — D429 Neoplasm of uncertain behavior of meninges, unspecified: Secondary | ICD-10-CM | POA: Diagnosis not present

## 2019-09-09 DIAGNOSIS — Z483 Aftercare following surgery for neoplasm: Secondary | ICD-10-CM | POA: Diagnosis not present

## 2019-09-09 DIAGNOSIS — D329 Benign neoplasm of meninges, unspecified: Secondary | ICD-10-CM | POA: Diagnosis not present

## 2019-09-09 DIAGNOSIS — D320 Benign neoplasm of cerebral meninges: Secondary | ICD-10-CM | POA: Diagnosis not present

## 2019-10-01 ENCOUNTER — Other Ambulatory Visit: Payer: Self-pay | Admitting: Family Medicine

## 2019-10-01 DIAGNOSIS — G459 Transient cerebral ischemic attack, unspecified: Secondary | ICD-10-CM

## 2019-10-01 DIAGNOSIS — I10 Essential (primary) hypertension: Secondary | ICD-10-CM

## 2019-10-19 ENCOUNTER — Other Ambulatory Visit: Payer: Self-pay | Admitting: Family Medicine

## 2019-10-19 DIAGNOSIS — E785 Hyperlipidemia, unspecified: Secondary | ICD-10-CM

## 2019-10-21 ENCOUNTER — Other Ambulatory Visit: Payer: Self-pay

## 2019-10-21 ENCOUNTER — Encounter: Payer: Self-pay | Admitting: Family Medicine

## 2019-10-21 ENCOUNTER — Ambulatory Visit (INDEPENDENT_AMBULATORY_CARE_PROVIDER_SITE_OTHER): Payer: Medicare Other | Admitting: Family Medicine

## 2019-10-21 VITALS — BP 138/61 | HR 43 | Temp 97.3°F

## 2019-10-21 DIAGNOSIS — G609 Hereditary and idiopathic neuropathy, unspecified: Secondary | ICD-10-CM

## 2019-10-21 DIAGNOSIS — G610 Guillain-Barre syndrome: Secondary | ICD-10-CM | POA: Diagnosis not present

## 2019-10-21 DIAGNOSIS — I10 Essential (primary) hypertension: Secondary | ICD-10-CM

## 2019-10-21 DIAGNOSIS — I4891 Unspecified atrial fibrillation: Secondary | ICD-10-CM

## 2019-10-21 DIAGNOSIS — E785 Hyperlipidemia, unspecified: Secondary | ICD-10-CM

## 2019-10-21 DIAGNOSIS — D649 Anemia, unspecified: Secondary | ICD-10-CM | POA: Diagnosis not present

## 2019-10-21 DIAGNOSIS — I6990 Unspecified sequelae of unspecified cerebrovascular disease: Secondary | ICD-10-CM

## 2019-10-21 DIAGNOSIS — G8222 Paraplegia, incomplete: Secondary | ICD-10-CM

## 2019-10-21 DIAGNOSIS — D329 Benign neoplasm of meninges, unspecified: Secondary | ICD-10-CM

## 2019-10-21 DIAGNOSIS — Z8673 Personal history of transient ischemic attack (TIA), and cerebral infarction without residual deficits: Secondary | ICD-10-CM | POA: Diagnosis not present

## 2019-10-21 DIAGNOSIS — R001 Bradycardia, unspecified: Secondary | ICD-10-CM

## 2019-10-21 LAB — LIPID PANEL
Chol/HDL Ratio: 2.7 ratio (ref 0.0–5.0)
Cholesterol, Total: 104 mg/dL (ref 100–199)
HDL: 38 mg/dL — ABNORMAL LOW (ref >39)
LDL Chol Calc (NIH): 54 mg/dL (ref 0–99)
Triglycerides: 47 mg/dL (ref 0–149)
VLDL Cholesterol Cal: 12 mg/dL (ref 5–40)

## 2019-10-21 LAB — CBC WITH DIFFERENTIAL/PLATELET
Basophils Absolute: 0.1 10*3/uL (ref 0.0–0.2)
Basos: 2 %
EOS (ABSOLUTE): 0.3 10*3/uL (ref 0.0–0.4)
Eos: 6 %
Hematocrit: 32.9 % — ABNORMAL LOW (ref 37.5–51.0)
Hemoglobin: 10.7 g/dL — ABNORMAL LOW (ref 13.0–17.7)
Immature Grans (Abs): 0 10*3/uL (ref 0.0–0.1)
Immature Granulocytes: 0 %
Lymphocytes Absolute: 1.2 10*3/uL (ref 0.7–3.1)
Lymphs: 27 %
MCH: 28.8 pg (ref 26.6–33.0)
MCHC: 32.5 g/dL (ref 31.5–35.7)
MCV: 89 fL (ref 79–97)
Monocytes Absolute: 0.3 10*3/uL (ref 0.1–0.9)
Monocytes: 7 %
Neutrophils Absolute: 2.6 10*3/uL (ref 1.4–7.0)
Neutrophils: 58 %
Platelets: 209 10*3/uL (ref 150–450)
RBC: 3.71 x10E6/uL — ABNORMAL LOW (ref 4.14–5.80)
RDW: 11.9 % (ref 11.6–15.4)
WBC: 4.5 10*3/uL (ref 3.4–10.8)

## 2019-10-21 LAB — CMP14+EGFR
ALT: 8 IU/L (ref 0–44)
AST: 10 IU/L (ref 0–40)
Albumin/Globulin Ratio: 1.8 (ref 1.2–2.2)
Albumin: 3.5 g/dL — ABNORMAL LOW (ref 3.6–4.6)
Alkaline Phosphatase: 65 IU/L (ref 48–121)
BUN/Creatinine Ratio: 20 (ref 10–24)
BUN: 14 mg/dL (ref 8–27)
Bilirubin Total: 0.3 mg/dL (ref 0.0–1.2)
CO2: 28 mmol/L (ref 20–29)
Calcium: 8.8 mg/dL (ref 8.6–10.2)
Chloride: 106 mmol/L (ref 96–106)
Creatinine, Ser: 0.7 mg/dL — ABNORMAL LOW (ref 0.76–1.27)
GFR calc Af Amer: 102 mL/min/{1.73_m2} (ref >59)
GFR calc non Af Amer: 89 mL/min/{1.73_m2} (ref >59)
Globulin, Total: 1.9 g/dL (ref 1.5–4.5)
Glucose: 90 mg/dL (ref 65–99)
Potassium: 4.3 mmol/L (ref 3.5–5.2)
Sodium: 145 mmol/L — ABNORMAL HIGH (ref 134–144)
Total Protein: 5.4 g/dL — ABNORMAL LOW (ref 6.0–8.5)

## 2019-10-21 NOTE — Progress Notes (Signed)
Assessment & Plan:  1. Essential (primary) hypertension - Well controlled on current regimen.   2. Hyperlipidemia, unspecified hyperlipidemia type - Well controlled on current regimen.  - Lipid panel  3. History of TIA (transient ischemic attack) - Patient is on statin therapy.  - Lipid panel  4. Atrial fibrillation, unspecified type ([place]) - Managed by cardiology.   5. Meningioma ([place]) - Managed by neurosurgeon. Doing well. Not to return for 3 years.   6. Idiopathic peripheral neuropathy - Well controlled on current regimen.    7-8. Paraplegia, incomplete (HCC)/Late effects of CVA (cerebrovascular accident) - Patient does well for himself and rides in a wheelchair.   9. Bradycardia - Discussed this is likely why Theodore Mendoza has been feeling weak and tired lately. Encouraged to call cardiologist.    Return in about 3 months (around 01/21/2020) for follow-up of chronic medication conditions.  [name], MSN, APRN, FNP-C [facility]  Subjective:    Patient ID: Theodore Mendoza, male    DOB: 09/07/1940, 79 y.o.   MRN: [id number]  Patient Care Team: Loman Brooklyn, FNP as PCP - General (Family Medicine) Loretta Plume as Consulting Physician (Neurosurgery) Alexis Frock, MD as Consulting Physician (Urology) Dudley Major, MD as Consulting Physician (Cardiology)   Chief Complaint:  Chief Complaint  Patient presents with  . Hypertension    3 month follow up. Patient states for the last 2 months Theodore Mendoza just wants to sleep all the time.  . Extremity Weakness    Patient states it is on going.    HPI: Theodore Mendoza is a 79 y.o. male presenting on 10/21/2019 for Hypertension (3 month follow up. Patient states for the last 2 months Theodore Mendoza just wants to sleep all the time.) and Extremity Weakness (Patient states it is on going.)  Patient saw Dr. Arlan Organ, neurosurgeon, regarding multiple meningiomas on 09/09/2019.  There has been no enlargement of his tumors and  the plan is to see him again in 3 years with a repeat brain MRI.  Patient is doing better with the increase in gabapentin at night to help with his neuropathy.  Patient sees cardiology for atrial fibrillation and CAD.  Theodore Mendoza has an appointment coming up on 11/12/2019.  New complaints: Patient reports Theodore Mendoza has been feeling lethargic and weak all the time for the past 2 months.   Social history:  Relevant past medical, surgical, family and social history reviewed and updated as indicated. Interim medical history since our last visit reviewed.  Allergies and medications reviewed and updated.  DATA REVIEWED: CHART IN EPIC  ROS: Negative unless specifically indicated above in HPI.    Current Outpatient Medications:  .  amLODipine (NORVASC) 5 MG tablet, TAKE 1 TABLET BY MOUTH DAILY., Disp: 90 tablet, Rfl: 0 .  apixaban (ELIQUIS) 5 MG TABS tablet, Take 5 mg by mouth 2 (two) times daily., Disp: , Rfl:  .  clopidogrel (PLAVIX) 75 MG tablet, TAKE 1 TABLET ONCE DAILY, Disp: 90 tablet, Rfl: 0 .  escitalopram (LEXAPRO) 10 MG tablet, Take 1 tablet (10 mg total) by mouth at bedtime., Disp: 90 tablet, Rfl: 1 .  Ferrous Sulfate (IRON) 325 (65 Fe) MG TABS, Take by mouth., Disp: , Rfl:  .  gabapentin (NEURONTIN) 300 MG capsule, Take 1 capsule (300 mg total) by mouth 2 (two) times daily AND 2 capsules (600 mg total) at bedtime., Disp: 360 capsule, Rfl: 1 .  lisinopril (ZESTRIL) 5 MG tablet, Take 1 tablet (5 mg total) by  mouth daily., Disp: 90 tablet, Rfl: 1 .  metoprolol tartrate (LOPRESSOR) 25 MG tablet, Take 1 tablet (25 mg total) by mouth daily., Disp: 90 tablet, Rfl: 1 .  pravastatin (PRAVACHOL) 40 MG tablet, TAKE 1 TABLET ONCE DAILY WITH LUNCH, Disp: 90 tablet, Rfl: 0 .  tamsulosin (FLOMAX) 0.4 MG CAPS capsule, Take by mouth., Disp: , Rfl:    Allergies  Allergen Reactions  . Hydrocodone-Acetaminophen   . Morphine And Related Other (See Comments)    Reaction:  Confusion/weakness/hallucinations  .  Valsartan Rash   Past Medical History:  Diagnosis Date  . Brain tumor ([place])    x2  . Guillain-Barre syndrome ([place]) Feb 13 1986  . Hypertension   . Myocardial infarction ([place]) 1997  . Stroke [facility])     Past Surgical History:  Procedure Laterality Date  . CORONARY ANGIOPLASTY  1997   after mi  . CYSTOSCOPY WITH RETROGRADE PYELOGRAM, URETEROSCOPY AND STENT PLACEMENT Left 06/28/2014   Procedure: 1ST STAGE CYSTOSCOPY/URETEROSCOPY/STENT PLACEMENT;  Surgeon: Alexis Frock, MD;  Location: [facility];  Service: Urology;  Laterality: Left;  . CYSTOSCOPY WITH STENT PLACEMENT Left 05/08/2014   Procedure: CYSTOSCOPY, RETROGRADE PYELOGRAM WITH LEFT URETERAL STENT PLACEMENT;  Surgeon: Alexis Frock, MD;  [facility];  Service: Urology;  Laterality: Left;  . EYE SURGERY Right may 2015   growth removed, july 2015left eye cataract removed, right eye catarct removed  . gamma kniferadiation treatment  [facility] 09 2014   [facility]  . HOLMIUM LASER APPLICATION Left 1/61/0960   Procedure: HOLMIUM LASER APPLICATION;  Surgeon: Alexis Frock, MD;  [facility];  Service: Urology;  Laterality: Left;  . LITHOTRIPSY  years ago  . STONE EXTRACTION WITH BASKET Left 06/28/2014   Procedure: STONE EXTRACTION WITH BASKET;  Surgeon: Alexis Frock, MD;  [facility];  Service: Urology;  Laterality: Left;    Social History   Socioeconomic History  . Marital status: Married    Spouse name: Not on file  . Number of children: Not on file  . Years of education: Not on file  . Highest education level: Not on file  Occupational History  . Not on file  Tobacco Use  . Smoking status: Never Smoker  . Smokeless tobacco: Never Used  Vaping Use  . Vaping Use: Never used  Substance and Sexual Activity  . Alcohol use: No  . Drug use: No  . Sexual activity: Not Currently  Other Topics Concern  . Not on file  Social History Narrative   Lives with wi   Social Determinants of Health   Financial  Resource Strain:   . Difficulty of Paying Living Expenses:   Food Insecurity:   . Worried About Charity fundraiser in the Last Year:   . Arboriculturist in the Last Year:   Transportation Needs:   . Film/video editor (Medical):   [name] Lack of Transportation (Non-Medical):   Physical Activity:   . Days of Exercise per Week:   . Minutes of Exercise per Session:   Stress:   . Feeling of Stress :   Social Connections:   . Frequency of Communication with Friends and Family:   . Frequency of Social Gatherings with Friends and Family:   . Attends Religious Services:   . Active Member of Clubs or Organizations:   . Attends Archivist Meetings:   [name] Marital Status:   Intimate Partner Violence:   . Fear of Current or Ex-Partner:   .  Emotionally Abused:   [name] Physically Abused:   . Sexually Abused:         Objective:    BP 138/61   Pulse (!) 43   Temp (!) 97.3 F (36.3 C) (Temporal)   SpO2 99%   Wt Readings from Last 3 Encounters:  07/22/19 178 lb (80.7 kg)  05/19/19 178 lb (80.7 kg)  10/23/15 173 lb (78.5 kg)    Physical Exam Vitals reviewed.  Constitutional:      General: Theodore Mendoza is not in acute distress.    Appearance: Normal appearance. Theodore Mendoza is not ill-appearing, toxic-appearing or diaphoretic.  HENT:     Head: Normocephalic and atraumatic.  Eyes:     General: No scleral icterus.       Right eye: No discharge.        Left eye: No discharge.     Conjunctiva/sclera: Conjunctivae normal.  Cardiovascular:     Rate and Rhythm: Regular rhythm. Bradycardia present.     Heart sounds: Normal heart sounds. No murmur heard.  No friction rub. No gallop.      Comments: HR 40 upon auscultation. Pulmonary:     Effort: Pulmonary effort is normal. No respiratory distress.     Breath sounds: Normal breath sounds. No stridor. No wheezing, rhonchi or rales.  Musculoskeletal:        General: Normal range of motion.     Cervical back: Normal range of motion.     Comments:  Paralysis from the waist down.  Skin:    General: Skin is warm and dry.  Neurological:     Mental Status: Theodore Mendoza is alert and oriented to person, place, and time. Mental status is at baseline.     Gait: Gait abnormal (rides in [facility]).  Psychiatric:        Mood and Affect: Mood normal.        Behavior: Behavior normal.        Thought Content: Thought content normal.        Judgment: Judgment normal.     No results found for: TSH Lab Results  Component Value Date   WBC 5.3 04/21/2019   HGB 11.4 (L) 04/21/2019   HCT 35.8 (L) 04/21/2019   MCV 90 04/21/2019   PLT 220 04/21/2019   Lab Results  Component Value Date   NA 145 (H) 04/21/2019   K 4.6 04/21/2019   CO2 27 04/21/2019   GLUCOSE 90 04/21/2019   BUN 16 04/21/2019   CREATININE 0.77 04/21/2019   BILITOT 0.6 04/21/2019   ALKPHOS 66 04/21/2019   AST 11 04/21/2019   ALT 6 04/21/2019   PROT 5.5 (L) 04/21/2019   ALBUMIN 3.8 04/21/2019   CALCIUM 9.1 04/21/2019   ANIONGAP 7 11/10/2015   Lab Results  Component Value Date   CHOL 118 10/02/2015   Lab Results  Component Value Date   HDL 37 (L) 10/02/2015   Lab Results  Component Value Date   LDLCALC 73 10/02/2015   Lab Results  Component Value Date   TRIG 39 10/02/2015   Lab Results  Component Value Date   CHOLHDL 3.2 10/02/2015   Lab Results  Component Value Date   HGBA1C 5.3 10/02/2015

## 2019-10-21 NOTE — Patient Instructions (Signed)
Bradycardia, Adult Bradycardia is a slower-than-normal heartbeat. A normal resting heart rate for an adult ranges from 60 to 100 beats per minute. With bradycardia, the resting heart rate is less than 60 beats per minute. Bradycardia can prevent enough oxygen from reaching certain areas of your body when you are active. It can be serious if it keeps enough oxygen from reaching your brain and other parts of your body. Bradycardia is not a problem for everyone. For some healthy adults, a slow resting heart rate is normal. What are the causes? This condition may be caused by:  A problem with the heart, including: ? A problem with the heart's electrical system, such as a heart block. With a heart block, electrical signals between the chambers of the heart are partially or completely blocked, so they are not able to work as they should. ? A problem with the heart's natural pacemaker (sinus node). ? Heart disease. ? A heart attack. ? Heart damage. ? Lyme disease. ? A heart infection. ? A heart condition that is present at birth (congenital heart defect).  Certain medicines that treat heart conditions.  Certain conditions, such as hypothyroidism and obstructive sleep apnea.  Problems with the balance of chemicals and other substances, like potassium, in the blood.  Trauma.  Radiation therapy. What increases the risk? You are more likely to develop this condition if you:  Are age 65 or older.  Have high blood pressure (hypertension), high cholesterol (hyperlipidemia), or diabetes.  Drink heavily, use tobacco or nicotine products, or use drugs. What are the signs or symptoms? Symptoms of this condition include:  Light-headedness.  Feeling faint or fainting.  Fatigue and weakness.  Trouble with activity or exercise.  Shortness of breath.  Chest pain (angina).  Drowsiness.  Confusion.  Dizziness. How is this diagnosed? This condition may be diagnosed based on:  Your  symptoms.  Your medical history.  A physical exam. During the exam, your health care provider will listen to your heartbeat and check your pulse. To confirm the diagnosis, your health care provider may order tests, such as:  Blood tests.  An electrocardiogram (ECG). This test records the heart's electrical activity. The test can show how fast your heart is beating and whether the heartbeat is steady.  A test in which you wear a portable device (event recorder or Holter monitor) to record your heart's electrical activity while you go about your day.  Anexercise test. How is this treated? Treatment for this condition depends on the cause of the condition and how severe your symptoms are. Treatment may involve:  Treatment of the underlying condition.  Changing your medicines or how much medicine you take.  Having a small, battery-operated device called a pacemaker implanted under the skin. When bradycardia occurs, this device can be used to increase your heart rate and help your heart beat in a regular rhythm. Follow these instructions at home: Lifestyle   Manage any health conditions that contribute to bradycardia as told by your health care provider.  Follow a heart-healthy diet. A nutrition specialist (dietitian) can help educate you about healthy food options and changes.  Follow an exercise program that is approved by your health care provider.  Maintain a healthy weight.  Try to reduce or manage your stress, such as with yoga or meditation. If you need help reducing stress, ask your health care provider.  Do not use any products that contain nicotine or tobacco, such as cigarettes, e-cigarettes, and chewing tobacco. If you need help   quitting, ask your health care provider.  Do not use illegal drugs.  Limit alcohol intake to no more than 1 drink a day for nonpregnant women and 2 drinks a day for men. Be aware of how much alcohol is in your drink. In the [place] one drink  equals one 12 oz bottle of beer (355 mL), one 5 oz glass of wine (148 mL), or one 1 oz glass of hard liquor (44 mL). General instructions  Take over-the-counter and prescription medicines only as told by your health care provider.  Keep all follow-up visits as told by your health care provider. This is important. How is this prevented? In some cases, bradycardia may be prevented by:  Treating underlying medical problems.  Stopping behaviors or medicines that can trigger the condition. Contact a health care provider if you:  Feel light-headed or dizzy.  Almost faint.  Feel weak or are easily fatigued during physical activity.  Experience confusion or have memory problems. Get help right away if:  You faint.  You have: ? An irregular heartbeat (palpitations). ? Chest pain. ? Trouble breathing. Summary  Bradycardia is a slower-than-normal heartbeat. With bradycardia, the resting heart rate is less than 60 beats per minute.  Treatment for this condition depends on the cause.  Manage any health conditions that contribute to bradycardia as told by your health care provider.  Do not use any products that contain nicotine or tobacco, such as cigarettes, e-cigarettes, and chewing tobacco, and limit alcohol intake.  Keep all follow-up visits as told by your health care provider. This is important. This information is not intended to replace advice given to you by your health care provider. Make sure you discuss any questions you have with your health care provider. Document Revised: 09/30/2017 Document Reviewed: 08/28/2017 Elsevier Patient Education  2020 Elsevier Inc.  

## 2019-10-23 ENCOUNTER — Other Ambulatory Visit: Payer: Medicare Other

## 2019-10-23 DIAGNOSIS — Z1211 Encounter for screening for malignant neoplasm of colon: Secondary | ICD-10-CM | POA: Diagnosis not present

## 2019-10-23 NOTE — Addendum Note (Signed)
Addended by: Liliane Bade on: 10/23/2019 03:17 PM   Modules accepted: Orders

## 2019-10-24 LAB — FECAL OCCULT BLOOD, IMMUNOCHEMICAL: Fecal Occult Bld: POSITIVE — AB

## 2019-10-26 ENCOUNTER — Other Ambulatory Visit: Payer: Self-pay | Admitting: Family Medicine

## 2019-10-26 DIAGNOSIS — R195 Other fecal abnormalities: Secondary | ICD-10-CM

## 2019-10-26 DIAGNOSIS — D649 Anemia, unspecified: Secondary | ICD-10-CM

## 2019-10-27 ENCOUNTER — Encounter: Payer: Self-pay | Admitting: Nurse Practitioner

## 2019-11-12 DIAGNOSIS — I2510 Atherosclerotic heart disease of native coronary artery without angina pectoris: Secondary | ICD-10-CM | POA: Diagnosis not present

## 2019-11-12 DIAGNOSIS — Z7901 Long term (current) use of anticoagulants: Secondary | ICD-10-CM | POA: Diagnosis not present

## 2019-11-12 DIAGNOSIS — I480 Paroxysmal atrial fibrillation: Secondary | ICD-10-CM | POA: Diagnosis not present

## 2019-11-12 DIAGNOSIS — Z79899 Other long term (current) drug therapy: Secondary | ICD-10-CM | POA: Diagnosis not present

## 2019-11-12 DIAGNOSIS — I4891 Unspecified atrial fibrillation: Secondary | ICD-10-CM | POA: Diagnosis not present

## 2019-11-12 DIAGNOSIS — E7800 Pure hypercholesterolemia, unspecified: Secondary | ICD-10-CM | POA: Diagnosis not present

## 2019-11-24 DIAGNOSIS — Z0120 Encounter for dental examination and cleaning without abnormal findings: Secondary | ICD-10-CM | POA: Diagnosis not present

## 2019-11-26 ENCOUNTER — Other Ambulatory Visit: Payer: Self-pay | Admitting: Family Medicine

## 2019-11-26 DIAGNOSIS — I10 Essential (primary) hypertension: Secondary | ICD-10-CM

## 2019-12-13 NOTE — Progress Notes (Signed)
12/13/2019 Theodore Mendoza [id number] 12-May-1940   CHIEF COMPLAINT: anemia, black stools   HISTORY OF PRESENT ILLNESS:  Theodore Mendoza is an 79 year old male with a past medical history of hypertension, hypercholesterolemia, coronary artery disease, NSTEMI s/p PTCA  distal cx 05/2006, paroxysmal atrial fibrillation 03/2019 s/p cardioversion 05/27/2019 on Eliquis, TIA vs CVA  10/2015 on Plavix, neuropathy, Guillain Barre syndrome 1987 and meningioma x 2 followed by neurosurgery.  He presents today for further evaluation regarding black stools and a positive FOBT. Labs 10/21/2019 showed anemia. Hg 10.7. MCV 89. He reports passing solid black stools once daily for the past 3 to 4 months. No loose tarry black stools. His BMs were previously brown. No bright red rectal bleeding. He is taking Ferrous Sulfate 325mg  one tab po daily for the past year. No Pepto bismal use. No dysphagia, heartburn or stomach pain. He does not recall every having an EGD but he had a feeding tube due to an 8 month hospital admission for Guillain Barre syndrome in 1997.  No lower abdominal pain. He  underwent a colonoscopy in [place], [place] 10 to 15 years ago which he reported was normal. He is appetite is good. Weight is stable. BP is 89/60. HR 64. He denies having any chest pain, palpitations, dizziness or SOB. No other complaints today. His wife is present.   He was last seen in office by his cardiologist, Dr. Metta Clines 11/12/2019. An ECHO was done which showed LV EF 60 - 65%. Trace MV and TV regurgitation. His BP at that time was 118/51. HR 70. He was back in afib at that time.     CBC Latest Ref Rng & Units 10/21/2019 04/21/2019 01/15/2019  WBC 3.4 - 10.8 x10E3/uL 4.5 5.3 6.5  Hemoglobin 13.0 - 17.7 g/dL 10.7(L) 11.4(L) 12.6(L)  Hematocrit 37.5 - 51.0 % 32.9(L) 35.8(L) 39.4  Platelets 150 - 450 x10E3/uL 209 220 172  MCV 89   CMP Latest Ref Rng & Units 10/21/2019 04/21/2019 01/15/2019  Glucose 65 - 99 mg/dL 90 90 86  BUN 8 -  27 mg/dL 14 16 16   Creatinine 0.76 - 1.27 mg/dL 0.70(L) 0.77 0.65(L)  Sodium 134 - 144 mmol/L 145(H) 145(H) 146(H)  Potassium 3.5 - 5.2 mmol/L 4.3 4.6 4.6  Chloride 96 - 106 mmol/L 106 107(H) 107(H)  CO2 20 - 29 mmol/L 28 27 30(H)  Calcium 8.6 - 10.2 mg/dL 8.8 9.1 8.9  Total Protein 6.0 - 8.5 g/dL 5.4(L) 5.5(L) 5.8(L)  Total Bilirubin 0.0 - 1.2 mg/dL 0.3 0.6 0.6  Alkaline Phos 48 - 121 IU/L 65 66 73  AST 0 - 40 IU/L 10 11 14   ALT 0 - 44 IU/L 8 6 9       Past Medical History:  Diagnosis Date   Brain tumor ([place])    x2   Guillain-Barre syndrome ([place]) Feb 13 1986   Hypertension    Myocardial infarction ([place]) 1997   Stroke ([place])    Past Surgical History:  Procedure Laterality Date   CORONARY ANGIOPLASTY  1997   after mi   CYSTOSCOPY WITH RETROGRADE PYELOGRAM, URETEROSCOPY AND STENT PLACEMENT Left 06/28/2014   Procedure: 1ST STAGE CYSTOSCOPY/URETEROSCOPY/STENT PLACEMENT;  Surgeon: Alexis Frock, MD;  [facility];  Service: Urology;  Laterality: Left;   CYSTOSCOPY WITH STENT PLACEMENT Left 05/08/2014   Procedure: CYSTOSCOPY, RETROGRADE PYELOGRAM WITH LEFT URETERAL STENT PLACEMENT;  Surgeon: Alexis Frock, MD;  [facility];  Service: Urology;  Laterality: Left;   EYE  SURGERY Right may 2015   growth removed, july 2015left eye cataract removed, right eye catarct removed   gamma kniferadiation treatment  Feb 09 2014   [facility]   HOLMIUM LASER APPLICATION Left 6/43/3295   Procedure: HOLMIUM LASER APPLICATION;  Surgeon: Alexis Frock, MD;  [facility];  Service: Urology;  Laterality: Left;   LITHOTRIPSY  years ago   STONE EXTRACTION WITH BASKET Left 06/28/2014   Procedure: STONE EXTRACTION WITH BASKET;  Surgeon: Alexis Frock, MD;  [facility];  Service: Urology;  Laterality: Left;   Social History: Married. He has one daughter. Nonsmoker. No alcohol. No drug use.   Family History: Mother smoked died 69 from lung cancer. Father died MI  age 67.  Sister had a stroke died age 42. Brother with DM.    reports that he has never smoked. He has never used smokeless tobacco. He reports that he does not drink alcohol and does not use drugs. family history includes CAD in his father; Diabetes in his brother, mother, and sister; Lung cancer in his mother; Other in his maternal grandfather; Stroke in his sister. Allergies  Allergen Reactions   Hydrocodone-Acetaminophen    Morphine And Related Other (See Comments)    Reaction:  Confusion/weakness/hallucinations   Valsartan Rash      Outpatient Encounter Medications as of 12/14/2019  Medication Sig   amLODipine (NORVASC) 5 MG tablet TAKE 1 TABLET BY MOUTH DAILY.   apixaban (ELIQUIS) 5 MG TABS tablet Take 5 mg by mouth 2 (two) times daily.   clopidogrel (PLAVIX) 75 MG tablet TAKE 1 TABLET ONCE DAILY   escitalopram (LEXAPRO) 10 MG tablet Take 1 tablet (10 mg total) by mouth at bedtime.   Ferrous Sulfate (IRON) 325 (65 Fe) MG TABS Take by mouth.   gabapentin (NEURONTIN) 300 MG capsule Take 1 capsule (300 mg total) by mouth 2 (two) times daily AND 2 capsules (600 mg total) at bedtime.   lisinopril (ZESTRIL) 5 MG tablet TAKE 1 TABLET ONCE DAILY   metoprolol tartrate (LOPRESSOR) 25 MG tablet Take 1 tablet (25 mg total) by mouth daily.   pravastatin (PRAVACHOL) 40 MG tablet TAKE 1 TABLET ONCE DAILY WITH LUNCH   tamsulosin (FLOMAX) 0.4 MG CAPS capsule Take by mouth.   No facility-administered encounter medications on file as of 12/14/2019.     REVIEW OF SYSTEMS:  Gen: Denies fever, sweats or chills. No weight loss.  CV: Denies chest pain or palpitations.  Resp: Denies cough, shortness of breath of hemoptysis.  GI: See HPI.  GU : Denies urinary burning, blood in urine, increased urinary frequency or incontinence. MS: Chronic weakness lower extremities > upper extremities secondary to Guillain Barre syndrome.  Derm: Denies rash, itchiness, skin lesions or unhealing  ulcers. Psych: Denies depression, anxiety or confusion. Heme: Denies bruising, bleeding. Neuro:  + difficulties sleeping. Denies headaches, dizziness or paresthesias. Endo:  Denies any problems with DM, thyroid or adrenal function.    PHYSICAL EXAM: BP (!) 89/60    Pulse 64    Ht 5\' 8"  (1.727 m)    Wt 173 lb (78.5 kg)    SpO2 99%    BMI 26.30 kg/m   General:  79 year old male presents in a wheelchair in no acute distress. Head: Normocephalic and atraumatic. Eyes:  Sclerae non-icteric, conjunctive pink. Ears: Normal auditory acuity. Mouth: Dentition intact. No ulcers or lesions. Geographic tongue, questionable candidiasis. Neck: Supple, no lymphadenopathy or thyromegaly.  Lungs: Clear bilaterally to auscultation without wheezes,  crackles or rhonchi. Heart: Irregular rhythm. No murmur, rub or gallop appreciated.  Abdomen:  Firm 3 to 4 cm mass above the umbilicus, abdominal aorta 3 cm pulsatile concerning for aneurysm. No hepatosplenomegaly. Normoactive bowel sounds x 4 quadrants. Dr. Fuller Mendoza re-examined the patient with the same finding.  Rectal: No stool in the rectum.  Musculoskeletal: Bilateral lower extremities in braces.  Skin: Warm and dry. No rash or lesions on visible extremities. Extremities: Bilateral LE's 2-3+ edema.  Neurological: Alert oriented x 4. Speech is clear.  Psychological:  Alert and cooperative. Normal mood and affect.  ASSESSMENT AND Mendoza:  63. 79 year old male with CAD MI s/p PTCA 2008, paroxysmal atrial fibrillation on Eliquis, TIA  On Plavix presents with anemia/melena. BP 89/60. He takes Ferrous Sulfate QD x 1 yr.  -Patient [facility] ED for further evaluation. Repeat CBC, iron panel. Eventual EGD.    2. Abdominal exam today concerning for an abdominal mass above the umbilicus and possible abdominal aortic aneurysm  -Patient sent to [facility] ED for further evaluation. Stat CT vs abdominal MRI imaging.     CC:  Loman Brooklyn, FNP

## 2019-12-14 ENCOUNTER — Encounter: Payer: Self-pay | Admitting: Nurse Practitioner

## 2019-12-14 ENCOUNTER — Encounter (HOSPITAL_COMMUNITY): Payer: Self-pay

## 2019-12-14 ENCOUNTER — Emergency Department (HOSPITAL_COMMUNITY): Payer: Medicare Other

## 2019-12-14 ENCOUNTER — Inpatient Hospital Stay (HOSPITAL_COMMUNITY)
Admission: EM | Admit: 2019-12-14 | Discharge: 2019-12-18 | DRG: 378 | Disposition: A | Discharge status: Home / Self Care | Payer: Medicare Other | Attending: Family Medicine | Admitting: Family Medicine

## 2019-12-14 ENCOUNTER — Other Ambulatory Visit: Payer: Self-pay

## 2019-12-14 ENCOUNTER — Ambulatory Visit: Payer: Medicare Other | Admitting: Nurse Practitioner

## 2019-12-14 VITALS — BP 89/60 | HR 64 | Ht 68.0 in | Wt 173.0 lb

## 2019-12-14 DIAGNOSIS — K921 Melena: Principal | ICD-10-CM | POA: Diagnosis present

## 2019-12-14 DIAGNOSIS — I714 Abdominal aortic aneurysm, without rupture, unspecified: Secondary | ICD-10-CM

## 2019-12-14 DIAGNOSIS — I4891 Unspecified atrial fibrillation: Secondary | ICD-10-CM | POA: Diagnosis not present

## 2019-12-14 DIAGNOSIS — Z7901 Long term (current) use of anticoagulants: Secondary | ICD-10-CM | POA: Diagnosis not present

## 2019-12-14 DIAGNOSIS — G8222 Paraplegia, incomplete: Secondary | ICD-10-CM | POA: Diagnosis not present

## 2019-12-14 DIAGNOSIS — Z888 Allergy status to other drugs, medicaments and biological substances status: Secondary | ICD-10-CM | POA: Diagnosis not present

## 2019-12-14 DIAGNOSIS — D649 Anemia, unspecified: Secondary | ICD-10-CM

## 2019-12-14 DIAGNOSIS — I252 Old myocardial infarction: Secondary | ICD-10-CM | POA: Diagnosis not present

## 2019-12-14 DIAGNOSIS — Z8669 Personal history of other diseases of the nervous system and sense organs: Secondary | ICD-10-CM | POA: Diagnosis not present

## 2019-12-14 DIAGNOSIS — I4821 Permanent atrial fibrillation: Secondary | ICD-10-CM | POA: Diagnosis not present

## 2019-12-14 DIAGNOSIS — I10 Essential (primary) hypertension: Secondary | ICD-10-CM | POA: Diagnosis not present

## 2019-12-14 DIAGNOSIS — Z79899 Other long term (current) drug therapy: Secondary | ICD-10-CM | POA: Diagnosis not present

## 2019-12-14 DIAGNOSIS — D62 Acute posthemorrhagic anemia: Secondary | ICD-10-CM | POA: Diagnosis present

## 2019-12-14 DIAGNOSIS — I110 Hypertensive heart disease with heart failure: Secondary | ICD-10-CM | POA: Diagnosis not present

## 2019-12-14 DIAGNOSIS — Z8673 Personal history of transient ischemic attack (TIA), and cerebral infarction without residual deficits: Secondary | ICD-10-CM | POA: Diagnosis not present

## 2019-12-14 DIAGNOSIS — I771 Stricture of artery: Secondary | ICD-10-CM | POA: Diagnosis not present

## 2019-12-14 DIAGNOSIS — I2510 Atherosclerotic heart disease of native coronary artery without angina pectoris: Secondary | ICD-10-CM | POA: Diagnosis present

## 2019-12-14 DIAGNOSIS — F329 Major depressive disorder, single episode, unspecified: Secondary | ICD-10-CM | POA: Diagnosis not present

## 2019-12-14 DIAGNOSIS — E538 Deficiency of other specified B group vitamins: Secondary | ICD-10-CM | POA: Diagnosis not present

## 2019-12-14 DIAGNOSIS — G459 Transient cerebral ischemic attack, unspecified: Secondary | ICD-10-CM

## 2019-12-14 DIAGNOSIS — R195 Other fecal abnormalities: Secondary | ICD-10-CM | POA: Diagnosis not present

## 2019-12-14 DIAGNOSIS — Z86011 Personal history of benign neoplasm of the brain: Secondary | ICD-10-CM

## 2019-12-14 DIAGNOSIS — I712 Thoracic aortic aneurysm, without rupture: Secondary | ICD-10-CM | POA: Diagnosis not present

## 2019-12-14 DIAGNOSIS — Z8249 Family history of ischemic heart disease and other diseases of the circulatory system: Secondary | ICD-10-CM | POA: Diagnosis not present

## 2019-12-14 DIAGNOSIS — Z955 Presence of coronary angioplasty implant and graft: Secondary | ICD-10-CM

## 2019-12-14 DIAGNOSIS — Z20822 Contact with and (suspected) exposure to covid-19: Secondary | ICD-10-CM | POA: Diagnosis present

## 2019-12-14 DIAGNOSIS — E785 Hyperlipidemia, unspecified: Secondary | ICD-10-CM | POA: Diagnosis present

## 2019-12-14 DIAGNOSIS — K317 Polyp of stomach and duodenum: Secondary | ICD-10-CM | POA: Diagnosis present

## 2019-12-14 DIAGNOSIS — K259 Gastric ulcer, unspecified as acute or chronic, without hemorrhage or perforation: Secondary | ICD-10-CM | POA: Diagnosis not present

## 2019-12-14 DIAGNOSIS — I509 Heart failure, unspecified: Secondary | ICD-10-CM | POA: Diagnosis not present

## 2019-12-14 DIAGNOSIS — G609 Hereditary and idiopathic neuropathy, unspecified: Secondary | ICD-10-CM | POA: Diagnosis not present

## 2019-12-14 DIAGNOSIS — I25810 Atherosclerosis of coronary artery bypass graft(s) without angina pectoris: Secondary | ICD-10-CM | POA: Diagnosis not present

## 2019-12-14 DIAGNOSIS — Z885 Allergy status to narcotic agent status: Secondary | ICD-10-CM | POA: Diagnosis not present

## 2019-12-14 DIAGNOSIS — Z7902 Long term (current) use of antithrombotics/antiplatelets: Secondary | ICD-10-CM

## 2019-12-14 DIAGNOSIS — I708 Atherosclerosis of other arteries: Secondary | ICD-10-CM | POA: Diagnosis not present

## 2019-12-14 DIAGNOSIS — Z993 Dependence on wheelchair: Secondary | ICD-10-CM | POA: Diagnosis not present

## 2019-12-14 DIAGNOSIS — K922 Gastrointestinal hemorrhage, unspecified: Secondary | ICD-10-CM | POA: Diagnosis not present

## 2019-12-14 DIAGNOSIS — I4819 Other persistent atrial fibrillation: Secondary | ICD-10-CM | POA: Diagnosis present

## 2019-12-14 DIAGNOSIS — I480 Paroxysmal atrial fibrillation: Secondary | ICD-10-CM | POA: Diagnosis not present

## 2019-12-14 LAB — COMPREHENSIVE METABOLIC PANEL
ALT: 10 U/L (ref 0–44)
AST: 13 U/L — ABNORMAL LOW (ref 15–41)
Albumin: 3.4 g/dL — ABNORMAL LOW (ref 3.5–5.0)
Alkaline Phosphatase: 57 U/L (ref 38–126)
Anion gap: 8 (ref 5–15)
BUN: 22 mg/dL (ref 8–23)
CO2: 30 mmol/L (ref 22–32)
Calcium: 8.8 mg/dL — ABNORMAL LOW (ref 8.9–10.3)
Chloride: 106 mmol/L (ref 98–111)
Creatinine, Ser: 0.65 mg/dL (ref 0.61–1.24)
GFR calc Af Amer: >60 mL/min (ref >60)
GFR calc non Af Amer: >60 mL/min (ref >60)
Glucose, Bld: 95 mg/dL (ref 70–99)
Potassium: 4.7 mmol/L (ref 3.5–5.1)
Sodium: 144 mmol/L (ref 135–145)
Total Bilirubin: 0.6 mg/dL (ref 0.3–1.2)
Total Protein: 5.7 g/dL — ABNORMAL LOW (ref 6.5–8.1)

## 2019-12-14 LAB — TYPE AND SCREEN
ABO/RH(D): O NEG
Antibody Screen: NEGATIVE

## 2019-12-14 LAB — CBC
HCT: 31.2 % — ABNORMAL LOW (ref 39.0–52.0)
Hemoglobin: 9.7 g/dL — ABNORMAL LOW (ref 13.0–17.0)
MCH: 28.5 pg (ref 26.0–34.0)
MCHC: 31.1 g/dL (ref 30.0–36.0)
MCV: 91.8 fL (ref 80.0–100.0)
Platelets: 204 10*3/uL (ref 150–400)
RBC: 3.4 MIL/uL — ABNORMAL LOW (ref 4.22–5.81)
RDW: 13 % (ref 11.5–15.5)
WBC: 5.1 10*3/uL (ref 4.0–10.5)
nRBC: 0 % (ref 0.0–0.2)

## 2019-12-14 LAB — ABO/RH: ABO/RH(D): O NEG

## 2019-12-14 LAB — MAGNESIUM: Magnesium: 2.1 mg/dL (ref 1.7–2.4)

## 2019-12-14 LAB — TSH: TSH: 3.241 u[IU]/mL (ref 0.350–4.500)

## 2019-12-14 LAB — SARS CORONAVIRUS 2 BY RT PCR (HOSPITAL ORDER, PERFORMED IN ~~LOC~~ HOSPITAL LAB): SARS Coronavirus 2: NEGATIVE

## 2019-12-14 MED ORDER — SODIUM CHLORIDE 0.9 % IV SOLN
INTRAVENOUS | Status: DC
  Administered 2019-12-17: 1000 mL via INTRAVENOUS

## 2019-12-14 MED ORDER — AMLODIPINE BESYLATE 5 MG PO TABS
5.0000 mg | ORAL_TABLET | Freq: Every day | ORAL | Status: DC
  Administered 2019-12-15 – 2019-12-17 (×3): 5 mg via ORAL
  Filled 2019-12-14 (×3): qty 1

## 2019-12-14 MED ORDER — FERROUS SULFATE 325 (65 FE) MG PO TABS
355.0000 mg | ORAL_TABLET | Freq: Every day | ORAL | Status: DC
  Administered 2019-12-15 – 2019-12-16 (×2): 325 mg via ORAL
  Filled 2019-12-14 (×2): qty 1

## 2019-12-14 MED ORDER — IOHEXOL 350 MG/ML SOLN
100.0000 mL | Freq: Once | INTRAVENOUS | Status: AC | PRN
  Administered 2019-12-14: 100 mL via INTRAVENOUS

## 2019-12-14 MED ORDER — GABAPENTIN 300 MG PO CAPS
300.0000 mg | ORAL_CAPSULE | Freq: Two times a day (BID) | ORAL | Status: DC
  Administered 2019-12-14 – 2019-12-17 (×7): 300 mg via ORAL
  Filled 2019-12-14 (×7): qty 1

## 2019-12-14 MED ORDER — ONDANSETRON HCL 4 MG/2ML IJ SOLN
4.0000 mg | Freq: Four times a day (QID) | INTRAMUSCULAR | Status: DC | PRN
  Administered 2019-12-15 (×2): 4 mg via INTRAVENOUS
  Filled 2019-12-14 (×2): qty 2

## 2019-12-14 MED ORDER — LISINOPRIL 5 MG PO TABS
5.0000 mg | ORAL_TABLET | Freq: Every day | ORAL | Status: DC
  Administered 2019-12-15 – 2019-12-17 (×3): 5 mg via ORAL
  Filled 2019-12-14 (×3): qty 1

## 2019-12-14 MED ORDER — TAMSULOSIN HCL 0.4 MG PO CAPS
0.4000 mg | ORAL_CAPSULE | Freq: Every day | ORAL | Status: DC
  Administered 2019-12-15 – 2019-12-17 (×3): 0.4 mg via ORAL
  Filled 2019-12-14 (×3): qty 1

## 2019-12-14 MED ORDER — ONDANSETRON HCL 4 MG PO TABS: 4.0000 mg | ORAL_TABLET | Freq: Four times a day (QID) | ORAL | Status: DC | PRN

## 2019-12-14 MED ORDER — ACETAMINOPHEN 650 MG RE SUPP: 650.0000 mg | Freq: Four times a day (QID) | RECTAL | Status: DC | PRN

## 2019-12-14 MED ORDER — ACETAMINOPHEN 325 MG PO TABS
650.0000 mg | ORAL_TABLET | Freq: Four times a day (QID) | ORAL | Status: DC | PRN
  Administered 2019-12-15 – 2019-12-17 (×3): 650 mg via ORAL
  Filled 2019-12-14 (×3): qty 2

## 2019-12-14 MED ORDER — ESCITALOPRAM OXALATE 10 MG PO TABS
10.0000 mg | ORAL_TABLET | Freq: Every day | ORAL | Status: DC
  Administered 2019-12-14 – 2019-12-17 (×4): 10 mg via ORAL
  Filled 2019-12-14 (×4): qty 1

## 2019-12-14 MED ORDER — METOPROLOL TARTRATE 25 MG PO TABS
12.5000 mg | ORAL_TABLET | Freq: Every day | ORAL | Status: DC
  Administered 2019-12-15 – 2019-12-17 (×3): 12.5 mg via ORAL
  Filled 2019-12-14 (×3): qty 1

## 2019-12-14 MED ORDER — PRAVASTATIN SODIUM 40 MG PO TABS
40.0000 mg | ORAL_TABLET | Freq: Every day | ORAL | Status: DC
  Administered 2019-12-15 – 2019-12-17 (×3): 40 mg via ORAL
  Filled 2019-12-14 (×3): qty 1

## 2019-12-14 NOTE — ED Notes (Signed)
Patient had a bed assignment but the unit cannot do cardiac monitoring, so Pahwani, MD contacted to see if patient really need cardiac monitoring.  He does, so bed placement made aware.

## 2019-12-14 NOTE — ED Notes (Signed)
Patient back from CT.

## 2019-12-14 NOTE — ED Provider Notes (Signed)
[facility] DEPT Provider Note   CSN: [id number] Arrival date & time: 12/14/19  0945     History Chief Complaint  Patient presents with  . Abnormal Lab    Theodore Mendoza is a 79 y.o. male.  HPI Patient sent in from with our GI for GI bleed.  Reportedly has had black stools for the last couple months.  Now feeling more lightheaded and more dizzy.  Was hypotensive there.  Reportedly no stool in vault for their exam.  Also had abdominal mass that was thought potentially to be a AAA.  Patient is on anticoagulation for atrial fibrillation and stroke.  Also has history of Guillain-Barr and is in a wheelchair at baseline.    Past Medical History:  Diagnosis Date  . Brain tumor ([place])    x2  . Cardiac arrhythmia   . CHF (congestive heart failure) ([place])   . Guillain-Barre syndrome ([place]) Feb 13 1986  . Hypertension   . Kidney stones   . Myocardial infarction [facility]) 2007    Patient Active Problem List   Diagnosis Date Noted  . Atrial fibrillation ([place]) 04/21/2019  . Paraplegia, incomplete ([place]) 10/16/2018  . Idiopathic peripheral neuropathy 06/26/2017  . Late effects of CVA (cerebrovascular accident) 11/16/2015  . History of TIA (transient ischemic attack) 10/01/2015  . Hyperlipidemia 05/08/2014  . Essential (primary) hypertension 05/07/2014  . Meningioma ([place]) 12/13/2011    Past Surgical History:  Procedure Laterality Date  . CORONARY ANGIOPLASTY  1997   after mi  . CYSTOSCOPY WITH RETROGRADE PYELOGRAM, URETEROSCOPY AND STENT PLACEMENT Left 06/28/2014   Procedure: 1ST STAGE CYSTOSCOPY/URETEROSCOPY/STENT PLACEMENT;  Surgeon: Alexis Frock, MD;  Location: [facility];  Service: Urology;  Laterality: Left;  . CYSTOSCOPY WITH STENT PLACEMENT Left 05/08/2014   Procedure: CYSTOSCOPY, RETROGRADE PYELOGRAM WITH LEFT URETERAL STENT PLACEMENT;  Surgeon: Alexis Frock, MD;  Location: [facility];  Service: Urology;  Laterality: Left;  . EYE SURGERY Right may 2015    growth removed, july 2015left eye cataract removed, right eye catarct removed  . gamma kniferadiation treatment  Feb 09 2014   [facility]  . HOLMIUM LASER APPLICATION Left 07/03/4740   Procedure: HOLMIUM LASER APPLICATION;  Surgeon: Alexis Frock, MD;  Location: [facility];  Service: Urology;  Laterality: Left;  . LITHOTRIPSY  years ago  . STONE EXTRACTION WITH BASKET Left 06/28/2014   Procedure: STONE EXTRACTION WITH BASKET;  Surgeon: Alexis Frock, MD;  Location: [facility];  Service: Urology;  Laterality: Left;       Family History  Problem Relation Age of Onset  . Diabetes Mother   . Lung cancer Mother   . CAD Father   . Diabetes Brother   . Diabetes Sister   . Stroke Sister   . Other Maternal Grandfather        brain tumor  . Colon cancer Neg Hx     Social History   Tobacco Use  . Smoking status: Never Smoker  . Smokeless tobacco: Never Used  Vaping Use  . Vaping Use: Never used  Substance Use Topics  . Alcohol use: No  . Drug use: No    Home Medications Prior to Admission medications   Medication Sig Start Date End Date Taking? Authorizing Provider  amLODipine (NORVASC) 5 MG tablet TAKE 1 TABLET BY MOUTH DAILY. Patient taking differently: Take 5 mg by mouth daily.  10/02/19  Yes [name] F, FNP  apixaban (ELIQUIS) 5 MG TABS tablet Take 5 mg by mouth  2 (two) times daily.   Yes [provider]  clopidogrel (PLAVIX) 75 MG tablet TAKE 1 TABLET ONCE DAILY Patient taking differently: Take 75 mg by mouth daily.  10/02/19  Yes [name] F, FNP  escitalopram (LEXAPRO) 10 MG tablet Take 1 tablet (10 mg total) by mouth at bedtime. 10/15/18  Yes [name] F, FNP  Ferrous Sulfate (IRON) 325 (65 Fe) MG TABS Take 1 tablet by mouth daily.    Yes [provider]  gabapentin (NEURONTIN) 300 MG capsule Take 1 capsule (300 mg total) by mouth 2 (two) times daily AND 2 capsules (600 mg total) at bedtime. 07/22/19  Yes [name] F, FNP  lisinopril  (ZESTRIL) 5 MG tablet TAKE 1 TABLET ONCE DAILY Patient taking differently: Take 5 mg by mouth daily.  11/27/19  Yes [name] F, FNP  metoprolol tartrate (LOPRESSOR) 25 MG tablet Take 1 tablet (25 mg total) by mouth daily. Patient taking differently: Take 12.5 mg by mouth daily.  10/15/18  Yes [name] F, FNP  pravastatin (PRAVACHOL) 40 MG tablet TAKE 1 TABLET ONCE DAILY WITH LUNCH Patient taking differently: Take 40 mg by mouth daily after lunch.  10/19/19  Yes [name] F, FNP  tamsulosin (FLOMAX) 0.4 MG CAPS capsule Take 0.4 mg by mouth daily.  02/10/18  Yes [provider]    Allergies    Hydrocodone-acetaminophen, Morphine and related, and Valsartan  Review of Systems   Review of Systems  Constitutional: Negative for appetite change.  HENT: Negative for congestion.   Respiratory: Negative for shortness of breath.   Cardiovascular: Positive for leg swelling.  Gastrointestinal: Positive for blood in stool.  Musculoskeletal: Negative for back pain.  Skin: Negative for rash.  Neurological: Positive for weakness and light-headedness.  Psychiatric/Behavioral: Negative for confusion.    Physical Exam Updated Vital Signs BP 98/87   Pulse 79   Temp 98 F (36.7 C) (Oral)   Resp 18   SpO2 100%   Physical Exam Vitals and nursing note reviewed.  HENT:     Head: Normocephalic.  Eyes:     Pupils: Pupils are equal, round, and reactive to light.  Cardiovascular:     Rate and Rhythm: Normal rate.  Pulmonary:     Breath sounds: No wheezing or rhonchi.  Abdominal:     Tenderness: There is abdominal tenderness.     Comments: Possible suprapubic mass and also for mass above umbilicus.  Not pulsatile.  Musculoskeletal:     Right lower leg: Edema present.     Left lower leg: Edema present.  Skin:    General: Skin is warm.     Capillary Refill: Capillary refill takes less than 2 seconds.     Coloration: Skin is not pale.  Neurological:     Mental Status: He is  alert.     Comments: Awake and appropriate.  Chronic weakness bilateral lower and somewhat upper extremities.     ED Results / Procedures / Treatments   Labs (all labs ordered are listed, but only abnormal results are displayed) Labs Reviewed  COMPREHENSIVE METABOLIC PANEL - Abnormal; Notable for the following components:      Result Value   Calcium 8.8 (*)    Total Protein 5.7 (*)    Albumin 3.4 (*)    AST 13 (*)    All other components within normal limits  CBC - Abnormal; Notable for the following components:   RBC 3.40 (*)    Hemoglobin 9.7 (*)  HCT 31.2 (*)    All other components within normal limits  SARS CORONAVIRUS 2 BY RT PCR [facility] ORDER, [place] LAB)  TYPE AND SCREEN  ABO/RH    EKG EKG Interpretation  Date/Time:  Monday December 14 2019 12:42:36 EDT Ventricular Rate:  86 PR Interval:    QRS Duration: 102 QT Interval:  389 QTC Calculation: 466 R Axis:   39 Text Interpretation: Atrial fibrillation Baseline wander in lead(s) V2 V5 Confirmed by Davonna Belling [phone]) on 12/14/2019 12:57:28 PM   Radiology CT Angio Abd/Pel W and/or Wo Contrast  Result Date: 12/14/2019 CLINICAL DATA:  Anemia and possible melena. Periumbilical pulsatile abdominal mass on physical exam concerning for abdominal aortic aneurysm which is not associated with acute abdominal pain or tenderness. EXAM: CT ANGIOGRAPHY ABDOMEN AND PELVIS WITH CONTRAST AND WITHOUT CONTRAST TECHNIQUE: Multidetector CT imaging of the abdomen and pelvis was performed using the standard protocol during bolus administration of intravenous contrast. Multiplanar reconstructed images and MIPs were obtained and reviewed to evaluate the vascular anatomy. CONTRAST:  141mL OMNIPAQUE IOHEXOL 350 MG/ML SOLN COMPARISON:  CT of the abdomen and pelvis without contrast on 11/10/2015 FINDINGS: VASCULAR Aorta: Since the prior unenhanced CT, there is progressive enlargement of the infrarenal abdominal  aorta with multiple areas of ulcerated plaque and penetrating ulcer disease. Beginning below the level of the renal arteries, prominent penetrating ulcer disease is seen along the right lateral wall of the abdominal aorta causing dilatation of 4 cm. Below the IMA origin, there is a large circumferential contained extrusion of contrast material consistent with a very large ulcerated plaque causing a broad penetrating ulcer. Maximal aortic diameter at this level is 4.5 cm with maximal transverse diameter of 4.1 cm. This represents enlargement since the prior unenhanced scan in 2017 at which time maximal aortic diameter was approximately 3.4 cm. Penetrating ulcer disease extends to the aortic bifurcation. No evidence of overt aneurysm rupture. Celiac: Stenosis of the proximal celiac trunk approaching 70% in maximal caliber with associated poststenotic dilatation of approximately 11-12 mm. Distal branches are patent. SMA: Proximal calcified plaque present with maximal narrowing of approximately 30%. Renals: Bilateral single renal arteries. Calcified plaque at the origins of both renal arteries cause moderate range stenoses estimated to be approximately 50-70% bilaterally, likely more significant on the left compared to the right. IMA: Patent. Inflow: Calcified plaque throughout the iliac arteries bilaterally. Component of ulcerated plaque at the origin of the right common iliac artery. No significant iliac aneurysmal disease. Focal stenosis of the proximal to mid left external iliac artery appears significant and is likely approaching 70-80% in caliber. Stenosis of the distal left external iliac artery of approximately 40%. Proximal Outflow: Atherosclerosis of bilateral common femoral arteries without evidence of significant stenosis or aneurysmal disease. Femoral bifurcations demonstrate occlusion of the proximal right SFA and high-grade stenosis of the proximal left SFA. Review of the MIP images confirms the above  findings. NON-VASCULAR Lower chest: No acute abnormality. Hepatobiliary: No focal liver abnormality is seen. No gallstones, gallbladder wall thickening, or biliary dilatation. Pancreas: Unremarkable. No pancreatic ductal dilatation or surrounding inflammatory changes. Spleen: Normal in size without focal abnormality. Adrenals/Urinary Tract: Adrenal glands are unremarkable. Kidneys demonstrate no hydronephrosis. Lower pole renal cyst on the right seen previously in 2017 demonstrates slight enlargement and interval development of some partial rim calcification. The bladder is unremarkable. Stomach/Bowel: Bowel shows no evidence obstruction, ileus, lesion or inflammation. No free intraperitoneal air identified. Lymphatic: No enlarged abdominal or pelvic lymph nodes.  Reproductive: Prostate is unremarkable. Other: No abdominal wall hernia or abnormality. No abdominopelvic ascites. Musculoskeletal: No fracture is seen. IMPRESSION: 1. Significant interval enlargement of the infrarenal abdominal aorta with multiple areas of ulcerated plaque and penetrating ulcer disease present. There is a large circumferential contained extrusion of contrast material along the anterior margin of the distal abdominal aorta consistent with a very large ulcerated plaque causing a broad penetrating ulcer. Maximal aortic diameter at this level is 4.5 cm with maximal transverse diameter of 4.1 cm. No evidence of overt aneurysm rupture. The aorta measured approximately 3.4 cm in 2017. Given interval aortic enlargement as well as potential unstable nature of plaque and penetrating ulcer disease, recommend elective referral to Vascular Surgery for assessment of aortic treatment options. 2. Moderate stenosis at the origins of both renal arteries estimated to be approximately 50-70% bilaterally, likely more significant on the left compared to the right. 3. Significant stenosis of the proximal to mid left external iliac artery approaching 70-80% in  caliber. 4. Occlusion of the proximal right SFA and high-grade stenosis of the proximal left SFA. 5. 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 6. Recommend referral to a vascular specialist. This recommendation follows ACR consensus guidelines: White Paper of the ACR Incidental Findings Committee II on Vascular Findings. J Am Coll Radiol 2013; 10:789-794. Electronically Signed   By: Aletta Edouard M.D.   On: 12/14/2019 14:02    Procedures Procedures (including critical care time)  Medications Ordered in ED Medications  iohexol (OMNIPAQUE) 350 MG/ML injection 100 mL (100 mLs Intravenous Contrast Given 12/14/19 1204)    ED Course  I have reviewed the triage vital signs and the nursing notes.  Pertinent labs & imaging results that were available during my care of the patient were reviewed by me and considered in my medical decision making (see chart for details).    MDM Rules/Calculators/A&P                          Patient sent in for GI bleed and possible AAA.  He is on anticoagulation.  Sounds as if he has been bleeding for a while but likely had enough anemia that just became symptomatic.  He is red from below with some mild frank blood.  Also had 4.5 cm AAA.  Discussed with Dr. Donzetta Matters from vascular surgery.  Needs follow-up in 6 months with them.  States he will contact them. Has seen [facility] as an outpatient who sent him in. Final Clinical Impression(s) / ED Diagnoses Final diagnoses:  Gastrointestinal hemorrhage, unspecified gastrointestinal hemorrhage type  Abdominal aortic aneurysm (AAA) without rupture [facility])    Rx / DC Orders ED Discharge Orders    None       Davonna Belling, MD 12/14/19 1453

## 2019-12-14 NOTE — ED Triage Notes (Signed)
Received call from [facility] . Pt is en route POV from office. Pt has hx of CAD and afib. Pt is on eliquis and plavix. Pt sent here for melena and anemia. NP at [facility] is also concerned for ?AAA

## 2019-12-14 NOTE — ED Triage Notes (Signed)
Patient reports that he has been having dark blood in his stools. Patient states, "I have been having this for 5-6 months. I don't see blood, but it is very dark." Patient is taking blood thinners. Patient was sent to the ED for abnormal labs, anemia, and atrial fib.

## 2019-12-14 NOTE — ED Notes (Signed)
Patient transported to CT 

## 2019-12-14 NOTE — Progress Notes (Signed)
Medically complex patient new to this practice today.  Nurse practitioner assessment and plan noted.  Colleen to follow-up on ordered labs and imaging.

## 2019-12-14 NOTE — Patient Instructions (Addendum)
If you are age 81 or older, your body mass index should be between 23-30. Your Body mass index is 26.3 kg/m. If this is out of the aforementioned range listed, please consider follow up with your Primary Care Provider.  If you are age 62 or younger, your body mass index should be between 19-25. Your Body mass index is 26.3 kg/m. If this is out of the aformentioned range listed, please consider follow up with your Primary Care Provider.   Patient sent to [facility] Emergency Room to rule out Abdominal Aortic Aneurysm.   Due to recent changes in healthcare laws, you may see the results of your imaging and laboratory studies on MyChart before your provider has had a chance to review them.  We understand that in some cases there may be results that are confusing or concerning to you. Not all laboratory results come back in the same time frame and the provider may be waiting for multiple results in order to interpret others.  Please give [place] 48 hours in order for your provider to thoroughly review all the results before contacting the office for clarification of your results.    Thank you for choosing [facility] Noralyn Pick, CRNP  [phone]

## 2019-12-14 NOTE — Progress Notes (Signed)
Progress Note  Chief Complaint:  Anemia, FOBT+       ASSESSMENT / PLAN:    Brief History:  79 year old male with PMH significant for but not limited to hypertension, hypercholesterolemia, CAD/NSTEMI status post distal PTCA, PAF on Eliquis, TIA versus CVA on Plavix, Ethelene Hal syndrome, meningioma x2 followed by neurosurgery.  #Normocytic anemia, FOBT positive stools on Eliquis and Plavix.   --Patient seen in consultation earlier this a.m. in our office [facility] Berniece Pap, [place] and Dr. Henrene Pastor) --Hemoglobin has been drifting over the last year with it being 12.6 in October, 10.7 in July, down to 9.7 today.  --Stools dark on oral Fe over last few months --Eliquis and Plavix on hold --Patient will need an endoscopic work-up.  Ideally, given need for anticoagulants, he would get both EGD and colonoscopy at some point.  However, I am concerned that he is at increased risk given age and also given infrarenal abdominal aorta findings on CT angio. --Can have diet today, NPO after MN just in case we opt for EGD tomorrow though not likely that we will  #Abnormal CT angio of the abdomen and pelvis with significant interval enlargement of an infrarenal abdominal aorta with multiple areas of ulcerated plaque and penetrating ulcer   Data reviewed:  12/14/19 CT-scan of the abdomen angio abd / pelvis  IMPRESSION: 1. Significant interval enlargement of the infrarenal abdominal aorta with multiple areas of ulcerated plaque and penetrating ulcer disease present. There is a large circumferential contained extrusion of contrast material along the anterior margin of the distal abdominal aorta consistent with a very large ulcerated plaque causing a broad penetrating ulcer. Maximal aortic diameter at this level is 4.5 cm with maximal transverse diameter of 4.1 cm. No evidence of overt aneurysm rupture. The aorta measured approximately 3.4 cm in 2017. Given interval aortic enlargement as well  as potential unstable nature of plaque and penetrating ulcer disease, recommend elective referral to Vascular Surgery for assessment of aortic treatment options. 2. Moderate stenosis at the origins of both renal arteries estimated to be approximately 50-70% bilaterally, likely more significant on the left compared to the right. 3. Significant stenosis of the proximal to mid left external iliac artery approaching 70-80% in caliber. 4. Occlusion of the proximal right SFA and high-grade stenosis of the proximal left SFA. 5. 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 6. Recommend referral to a vascular specialist. This recommendation follows ACR consensus guidelines: White Paper of the ACR Incidental Findings Committee II on Vascular Findings. J Am Coll Radiol 2013; 10:789-794.       SUBJECTIVE:   No specific complaints.     OBJECTIVE:     Scheduled inpatient medications:  . [START ON 12/15/2019] amLODipine  5 mg Oral Daily  . escitalopram  10 mg Oral QHS  . [START ON 12/15/2019] ferrous sulfate  325 mg Oral Daily  . gabapentin  300 mg Oral BID  . [START ON 12/15/2019] lisinopril  5 mg Oral Daily  . [START ON 12/15/2019] metoprolol tartrate  12.5 mg Oral Daily  . [START ON 12/15/2019] pravastatin  40 mg Oral QPC lunch  . [START ON 12/15/2019] tamsulosin  0.4 mg Oral Daily   Continuous inpatient infusions:  . sodium chloride     PRN inpatient medications: acetaminophen **OR** acetaminophen, ondansetron **OR** ondansetron (ZOFRAN) IV  Vital signs in last 24 hours: Temp:  [98 F (36.7 C)] 98 F (36.7 C) (09/13 0956) Pulse Rate:  [  64-97] 97 (09/13 1630) Resp:  [12-19] 15 (09/13 1630) BP: (89-147)/(60-87) 137/81 (09/13 1630) SpO2:  [96 %-100 %] 99 % (09/13 1630) Weight:  [78.5 kg] 78.5 kg (09/13 0818)    Intake/Output Summary (Last 24 hours) at 12/14/2019 1642 Last data filed at 12/14/2019 1431 Gross per 24 hour  Intake --  Output 350  ml  Net -350 ml     Physical Exam:  . General: Alert, male in NAD . Heart:  Regular rate, 2+ bilateral pedal edema . Pulmonary: Normal respiratory effort . Abdomen: Soft, nondistended, Nontender. Pulsating mass above umbilicus.  . Neurologic: Alert and oriented . Psych: Pleasant. Cooperative.   There were no vitals filed for this visit.  Intake/Output from previous day: No intake/output data recorded. Intake/Output this shift: Total I/O In: -  Out: 350 [Urine:350]    Lab Results: Recent Labs    12/14/19 1100  WBC 5.1  HGB 9.7*  HCT 31.2*  PLT 204   BMET Recent Labs    12/14/19 1100  NA 144  K 4.7  CL 106  CO2 30  GLUCOSE 95  BUN 22  CREATININE 0.65  CALCIUM 8.8*   LFT Recent Labs    12/14/19 1100  PROT 5.7*  ALBUMIN 3.4*  AST 13*  ALT 10  ALKPHOS 57  BILITOT 0.6   PT/INR No results for input(s): LABPROT, INR in the last 72 hours. Hepatitis Panel No results for input(s): HEPBSAG, HCVAB, HEPAIGM, HEPBIGM in the last 72 hours.  CT Angio Abd/Pel W and/or Wo Contrast  Result Date: 12/14/2019 CLINICAL DATA:  Anemia and possible melena. Periumbilical pulsatile abdominal mass on physical exam concerning for abdominal aortic aneurysm which is not associated with acute abdominal pain or tenderness. EXAM: CT ANGIOGRAPHY ABDOMEN AND PELVIS WITH CONTRAST AND WITHOUT CONTRAST TECHNIQUE: Multidetector CT imaging of the abdomen and pelvis was performed using the standard protocol during bolus administration of intravenous contrast. Multiplanar reconstructed images and MIPs were obtained and reviewed to evaluate the vascular anatomy. CONTRAST:  135mL OMNIPAQUE IOHEXOL 350 MG/ML SOLN COMPARISON:  CT of the abdomen and pelvis without contrast on 11/10/2015 FINDINGS: VASCULAR Aorta: Since the prior unenhanced CT, there is progressive enlargement of the infrarenal abdominal aorta with multiple areas of ulcerated plaque and penetrating ulcer disease. Beginning below the  level of the renal arteries, prominent penetrating ulcer disease is seen along the right lateral wall of the abdominal aorta causing dilatation of 4 cm. Below the IMA origin, there is a large circumferential contained extrusion of contrast material consistent with a very large ulcerated plaque causing a broad penetrating ulcer. Maximal aortic diameter at this level is 4.5 cm with maximal transverse diameter of 4.1 cm. This represents enlargement since the prior unenhanced scan in 2017 at which time maximal aortic diameter was approximately 3.4 cm. Penetrating ulcer disease extends to the aortic bifurcation. No evidence of overt aneurysm rupture. Celiac: Stenosis of the proximal celiac trunk approaching 70% in maximal caliber with associated poststenotic dilatation of approximately 11-12 mm. Distal branches are patent. SMA: Proximal calcified plaque present with maximal narrowing of approximately 30%. Renals: Bilateral single renal arteries. Calcified plaque at the origins of both renal arteries cause moderate range stenoses estimated to be approximately 50-70% bilaterally, likely more significant on the left compared to the right. IMA: Patent. Inflow: Calcified plaque throughout the iliac arteries bilaterally. Component of ulcerated plaque at the origin of the right common iliac artery. No significant iliac aneurysmal disease. Focal stenosis of the proximal to mid left  external iliac artery appears significant and is likely approaching 70-80% in caliber. Stenosis of the distal left external iliac artery of approximately 40%. Proximal Outflow: Atherosclerosis of bilateral common femoral arteries without evidence of significant stenosis or aneurysmal disease. Femoral bifurcations demonstrate occlusion of the proximal right SFA and high-grade stenosis of the proximal left SFA. Review of the MIP images confirms the above findings. NON-VASCULAR Lower chest: No acute abnormality. Hepatobiliary: No focal liver abnormality  is seen. No gallstones, gallbladder wall thickening, or biliary dilatation. Pancreas: Unremarkable. No pancreatic ductal dilatation or surrounding inflammatory changes. Spleen: Normal in size without focal abnormality. Adrenals/Urinary Tract: Adrenal glands are unremarkable. Kidneys demonstrate no hydronephrosis. Lower pole renal cyst on the right seen previously in 2017 demonstrates slight enlargement and interval development of some partial rim calcification. The bladder is unremarkable. Stomach/Bowel: Bowel shows no evidence obstruction, ileus, lesion or inflammation. No free intraperitoneal air identified. Lymphatic: No enlarged abdominal or pelvic lymph nodes. Reproductive: Prostate is unremarkable. Other: No abdominal wall hernia or abnormality. No abdominopelvic ascites. Musculoskeletal: No fracture is seen. IMPRESSION: 1. Significant interval enlargement of the infrarenal abdominal aorta with multiple areas of ulcerated plaque and penetrating ulcer disease present. There is a large circumferential contained extrusion of contrast material along the anterior margin of the distal abdominal aorta consistent with a very large ulcerated plaque causing a broad penetrating ulcer. Maximal aortic diameter at this level is 4.5 cm with maximal transverse diameter of 4.1 cm. No evidence of overt aneurysm rupture. The aorta measured approximately 3.4 cm in 2017. Given interval aortic enlargement as well as potential unstable nature of plaque and penetrating ulcer disease, recommend elective referral to Vascular Surgery for assessment of aortic treatment options. 2. Moderate stenosis at the origins of both renal arteries estimated to be approximately 50-70% bilaterally, likely more significant on the left compared to the right. 3. Significant stenosis of the proximal to mid left external iliac artery approaching 70-80% in caliber. 4. Occlusion of the proximal right SFA and high-grade stenosis of the proximal left SFA. 5.  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 6. Recommend referral to a vascular specialist. This recommendation follows ACR consensus guidelines: White Paper of the ACR Incidental Findings Committee II on Vascular Findings. J Am Coll Radiol 2013; 10:789-794. Electronically Signed   By: Aletta Edouard M.D.   On: 12/14/2019 14:02      Active Problems:   Essential (primary) hypertension   Hyperlipidemia   History of TIA (transient ischemic attack)   Paraplegia, incomplete (HCC)   Atrial fibrillation (HCC)   Symptomatic anemia   Upper GI bleeding     LOS: 0 days   Tye Savoy ,NP 12/14/2019, 4:42 PM

## 2019-12-14 NOTE — H&P (Addendum)
History and Physical    [name] LPF:[id number] DOB: 1940/04/05 DOA: 12/14/2019  PCP: Loman Brooklyn, FNP  Patient coming from: Home  I have personally briefly reviewed patient's old medical records in [place]  Chief Complaint: Black stools and dizziness  HPI: Theodore Mendoza is a 79 y.o. male with medical history significant of hypertension, hypercholesterolemia, coronary artery disease, NSTEMI s/p PTCA  distal cx 05/2006, paroxysmal atrial fibrillation 03/2019 s/p cardioversion 05/27/2019 on Eliquis, TIA vs CVA  10/2015 on Plavix, neuropathy, Guillain Barre syndrome 1987 and meningioma x 2 followed by neurosurgery who initially went to see gastroenterologist today in the office for having black stools for last 2 to 3 months and having some dizziness for last 4 to 5 weeks.  Reportedly over at the office, he was hypotensive and symptomatic with dizziness and an abdominal mass was felt on clinical examination and they suspected aortic aneurysm so he was sent to the ED for further evaluation.  ED Course: Upon arrival to ED, patient was hemodynamically stable without having any other symptoms other than dizziness.  CT angiogram of abdomen and pelvis with contrast and without contrast was done which showed abdominal aortic aneurysm with a size of 4.5 cm but no dissection.  ED physician had discussed case with on-call vascular surgeon Dr. Vella Redhead who recommended outpatient follow-up.  Upon my evaluation in the ED, patient's wife is accompanied with the patient and patient has no other complaint at this point in time.  Denies any abdominal pain, chest pain, shortness of breath, nausea, vomiting or any other complaint.  Denies seeing any blood in the stool.  Has been having solid stools with black color but no diarrhea.  Review of Systems: As per HPI otherwise negative.    Past Medical History:  Diagnosis Date  . Brain tumor ([place])    x2  . Cardiac arrhythmia   . CHF (congestive heart  failure) ([place])   . Guillain-Barre syndrome ([place]) Feb 13 1986  . Hypertension   . Kidney stones   . Myocardial infarction [facility]) 2007    Past Surgical History:  Procedure Laterality Date  . CORONARY ANGIOPLASTY  1997   after mi  . CYSTOSCOPY WITH RETROGRADE PYELOGRAM, URETEROSCOPY AND STENT PLACEMENT Left 06/28/2014   Procedure: 1ST STAGE CYSTOSCOPY/URETEROSCOPY/STENT PLACEMENT;  Surgeon: Alexis Frock, MD;  Location: [facility];  Service: Urology;  Laterality: Left;  . CYSTOSCOPY WITH STENT PLACEMENT Left 05/08/2014   Procedure: CYSTOSCOPY, RETROGRADE PYELOGRAM WITH LEFT URETERAL STENT PLACEMENT;  Surgeon: Alexis Frock, MD;  Location: [facility];  Service: Urology;  Laterality: Left;  . EYE SURGERY Right may 2015   growth removed, july 2015left eye cataract removed, right eye catarct removed  . gamma kniferadiation treatment  Feb 09 2014   [facility]  . HOLMIUM LASER APPLICATION Left 5/32/9924   Procedure: HOLMIUM LASER APPLICATION;  Surgeon: Alexis Frock, MD;  Location: [facility];  Service: Urology;  Laterality: Left;  . LITHOTRIPSY  years ago  . STONE EXTRACTION WITH BASKET Left 06/28/2014   Procedure: STONE EXTRACTION WITH BASKET;  Surgeon: Alexis Frock, MD;  Location: [facility];  Service: Urology;  Laterality: Left;     reports that he has never smoked. He has never used smokeless tobacco. He reports that he does not drink alcohol and does not use drugs.  Allergies  Allergen Reactions  . Hydrocodone-Acetaminophen   . Morphine And Related Other (See Comments)    Reaction:  Confusion/weakness/hallucinations  . Valsartan Rash  Family History  Problem Relation Age of Onset  . Diabetes Mother   . Lung cancer Mother   . CAD Father   . Diabetes Brother   . Diabetes Sister   . Stroke Sister   . Other Maternal Grandfather        brain tumor  . Colon cancer Neg Hx     Prior to Admission medications   Medication Sig Start Date End Date Taking? Authorizing Provider    amLODipine (NORVASC) 5 MG tablet TAKE 1 TABLET BY MOUTH DAILY. Patient taking differently: Take 5 mg by mouth daily.  10/02/19  Yes [name] F, FNP  apixaban (ELIQUIS) 5 MG TABS tablet Take 5 mg by mouth 2 (two) times daily.   Yes [provider]  clopidogrel (PLAVIX) 75 MG tablet TAKE 1 TABLET ONCE DAILY Patient taking differently: Take 75 mg by mouth daily.  10/02/19  Yes [name] F, FNP  escitalopram (LEXAPRO) 10 MG tablet Take 1 tablet (10 mg total) by mouth at bedtime. 10/15/18  Yes [name] F, FNP  Ferrous Sulfate (IRON) 325 (65 Fe) MG TABS Take 1 tablet by mouth daily.    Yes [provider]  gabapentin (NEURONTIN) 300 MG capsule Take 1 capsule (300 mg total) by mouth 2 (two) times daily AND 2 capsules (600 mg total) at bedtime. 07/22/19  Yes [name] F, FNP  lisinopril (ZESTRIL) 5 MG tablet TAKE 1 TABLET ONCE DAILY Patient taking differently: Take 5 mg by mouth daily.  11/27/19  Yes [name] F, FNP  metoprolol tartrate (LOPRESSOR) 25 MG tablet Take 1 tablet (25 mg total) by mouth daily. Patient taking differently: Take 12.5 mg by mouth daily.  10/15/18  Yes [name] F, FNP  pravastatin (PRAVACHOL) 40 MG tablet TAKE 1 TABLET ONCE DAILY WITH LUNCH Patient taking differently: Take 40 mg by mouth daily after lunch.  10/19/19  Yes [name] F, FNP  tamsulosin (FLOMAX) 0.4 MG CAPS capsule Take 0.4 mg by mouth daily.  02/10/18  Yes [provider]    Physical Exam: Vitals:   12/14/19 1330 12/14/19 1400 12/14/19 1430 12/14/19 1517  BP: 111/69 (!) 142/75 98/87 129/83  Pulse: 70 76 79 87  Resp: 19 15 18 17   Temp:      TempSrc:      SpO2: 99% 98% 100% 96%    Constitutional: NAD, calm, comfortable Vitals:   12/14/19 1330 12/14/19 1400 12/14/19 1430 12/14/19 1517  BP: 111/69 (!) 142/75 98/87 129/83  Pulse: 70 76 79 87  Resp: 19 15 18 17   Temp:      TempSrc:      SpO2: 99% 98% 100% 96%   Eyes: PERRL, lids and conjunctivae  normal ENMT: Mucous membranes are moist. Posterior pharynx clear of any exudate or lesions.Normal dentition.  Neck: normal, supple, no masses, no thyromegaly Respiratory: clear to auscultation bilaterally, no wheezing, no crackles. Normal respiratory effort. No accessory muscle use.  Cardiovascular: Regular rate and rhythm, no murmurs / rubs / gallops. No extremity edema. 2+ pedal pulses. No carotid bruits.  Abdomen: no tenderness, pulsatile mass palpable at the epigastric region. No hepatosplenomegaly. Bowel sounds positive.  Musculoskeletal: no clubbing / cyanosis. No joint deformity upper and lower extremities. Good ROM, no contractures. Normal muscle tone.  Skin: no rashes, lesions, ulcers. No induration Neurologic: CN 2-12 grossly intact.  Paraplegic Psychiatric: Normal judgment and insight. Alert and oriented x 3. Normal mood.    Labs on Admission: I have personally reviewed following  labs and imaging studies  CBC: Recent Labs  Lab 12/14/19 1100  WBC 5.1  HGB 9.7*  HCT 31.2*  MCV 91.8  PLT 154   Basic Metabolic Panel: Recent Labs  Lab 12/14/19 1100  NA 144  K 4.7  CL 106  CO2 30  GLUCOSE 95  BUN 22  CREATININE 0.65  CALCIUM 8.8*   GFR: Estimated Creatinine Clearance: 70.1 mL/min (by C-G formula based on SCr of 0.65 mg/dL). Liver Function Tests: Recent Labs  Lab 12/14/19 1100  AST 13*  ALT 10  ALKPHOS 57  BILITOT 0.6  PROT 5.7*  ALBUMIN 3.4*   No results for input(s): LIPASE, AMYLASE in the last 168 hours. No results for input(s): AMMONIA in the last 168 hours. Coagulation Profile: No results for input(s): INR, PROTIME in the last 168 hours. Cardiac Enzymes: No results for input(s): CKTOTAL, CKMB, CKMBINDEX, TROPONINI in the last 168 hours. BNP (last 3 results) No results for input(s): PROBNP in the last 8760 hours. HbA1C: No results for input(s): HGBA1C in the last 72 hours. CBG: No results for input(s): GLUCAP in the last 168 hours. Lipid  Profile: No results for input(s): CHOL, HDL, LDLCALC, TRIG, CHOLHDL, LDLDIRECT in the last 72 hours. Thyroid Function Tests: No results for input(s): TSH, T4TOTAL, FREET4, T3FREE, THYROIDAB in the last 72 hours. Anemia Panel: No results for input(s): VITAMINB12, FOLATE, FERRITIN, TIBC, IRON, RETICCTPCT in the last 72 hours. Urine analysis:    Component Value Date/Time   COLORURINE YELLOW 11/10/2015 1500   APPEARANCEUR TURBID (A) 11/10/2015 1500   LABSPEC 1.017 11/10/2015 1500   PHURINE 8.5 (H) 11/10/2015 1500   GLUCOSEU NEGATIVE 11/10/2015 1500   HGBUR NEGATIVE 11/10/2015 1500   BILIRUBINUR NEGATIVE 11/10/2015 1500   KETONESUR NEGATIVE 11/10/2015 1500   PROTEINUR NEGATIVE 11/10/2015 1500   UROBILINOGEN 1.0 05/07/2014 1838   NITRITE POSITIVE (A) 11/10/2015 1500   LEUKOCYTESUR MODERATE (A) 11/10/2015 1500    Radiological Exams on Admission: CT Angio Abd/Pel W and/or Wo Contrast  Result Date: 12/14/2019 CLINICAL DATA:  Anemia and possible melena. Periumbilical pulsatile abdominal mass on physical exam concerning for abdominal aortic aneurysm which is not associated with acute abdominal pain or tenderness. EXAM: CT ANGIOGRAPHY ABDOMEN AND PELVIS WITH CONTRAST AND WITHOUT CONTRAST TECHNIQUE: Multidetector CT imaging of the abdomen and pelvis was performed using the standard protocol during bolus administration of intravenous contrast. Multiplanar reconstructed images and MIPs were obtained and reviewed to evaluate the vascular anatomy. CONTRAST:  114mL OMNIPAQUE IOHEXOL 350 MG/ML SOLN COMPARISON:  CT of the abdomen and pelvis without contrast on 11/10/2015 FINDINGS: VASCULAR Aorta: Since the prior unenhanced CT, there is progressive enlargement of the infrarenal abdominal aorta with multiple areas of ulcerated plaque and penetrating ulcer disease. Beginning below the level of the renal arteries, prominent penetrating ulcer disease is seen along the right lateral wall of the abdominal aorta  causing dilatation of 4 cm. Below the IMA origin, there is a large circumferential contained extrusion of contrast material consistent with a very large ulcerated plaque causing a broad penetrating ulcer. Maximal aortic diameter at this level is 4.5 cm with maximal transverse diameter of 4.1 cm. This represents enlargement since the prior unenhanced scan in 2017 at which time maximal aortic diameter was approximately 3.4 cm. Penetrating ulcer disease extends to the aortic bifurcation. No evidence of overt aneurysm rupture. Celiac: Stenosis of the proximal celiac trunk approaching 70% in maximal caliber with associated poststenotic dilatation of approximately 11-12 mm. Distal branches are patent. SMA: Proximal  calcified plaque present with maximal narrowing of approximately 30%. Renals: Bilateral single renal arteries. Calcified plaque at the origins of both renal arteries cause moderate range stenoses estimated to be approximately 50-70% bilaterally, likely more significant on the left compared to the right. IMA: Patent. Inflow: Calcified plaque throughout the iliac arteries bilaterally. Component of ulcerated plaque at the origin of the right common iliac artery. No significant iliac aneurysmal disease. Focal stenosis of the proximal to mid left external iliac artery appears significant and is likely approaching 70-80% in caliber. Stenosis of the distal left external iliac artery of approximately 40%. Proximal Outflow: Atherosclerosis of bilateral common femoral arteries without evidence of significant stenosis or aneurysmal disease. Femoral bifurcations demonstrate occlusion of the proximal right SFA and high-grade stenosis of the proximal left SFA. Review of the MIP images confirms the above findings. NON-VASCULAR Lower chest: No acute abnormality. Hepatobiliary: No focal liver abnormality is seen. No gallstones, gallbladder wall thickening, or biliary dilatation. Pancreas: Unremarkable. No pancreatic ductal  dilatation or surrounding inflammatory changes. Spleen: Normal in size without focal abnormality. Adrenals/Urinary Tract: Adrenal glands are unremarkable. Kidneys demonstrate no hydronephrosis. Lower pole renal cyst on the right seen previously in 2017 demonstrates slight enlargement and interval development of some partial rim calcification. The bladder is unremarkable. Stomach/Bowel: Bowel shows no evidence obstruction, ileus, lesion or inflammation. No free intraperitoneal air identified. Lymphatic: No enlarged abdominal or pelvic lymph nodes. Reproductive: Prostate is unremarkable. Other: No abdominal wall hernia or abnormality. No abdominopelvic ascites. Musculoskeletal: No fracture is seen. IMPRESSION: 1. Significant interval enlargement of the infrarenal abdominal aorta with multiple areas of ulcerated plaque and penetrating ulcer disease present. There is a large circumferential contained extrusion of contrast material along the anterior margin of the distal abdominal aorta consistent with a very large ulcerated plaque causing a broad penetrating ulcer. Maximal aortic diameter at this level is 4.5 cm with maximal transverse diameter of 4.1 cm. No evidence of overt aneurysm rupture. The aorta measured approximately 3.4 cm in 2017. Given interval aortic enlargement as well as potential unstable nature of plaque and penetrating ulcer disease, recommend elective referral to Vascular Surgery for assessment of aortic treatment options. 2. Moderate stenosis at the origins of both renal arteries estimated to be approximately 50-70% bilaterally, likely more significant on the left compared to the right. 3. Significant stenosis of the proximal to mid left external iliac artery approaching 70-80% in caliber. 4. Occlusion of the proximal right SFA and high-grade stenosis of the proximal left SFA. 5. 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 6. Recommend referral to a vascular  specialist. This recommendation follows ACR consensus guidelines: White Paper of the ACR Incidental Findings Committee II on Vascular Findings. J Am Coll Radiol 2013; 10:789-794. Electronically Signed   By: Aletta Edouard M.D.   On: 12/14/2019 14:02    EKG: Independently reviewed.  Rate controlled atrial fibrillation  Assessment/Plan Active Problems:   Essential (primary) hypertension   Hyperlipidemia   History of TIA (transient ischemic attack)   Paraplegia, incomplete (HCC)   Atrial fibrillation (HCC)   Symptomatic anemia   Upper GI bleeding    Large abdominal aortic aneurysm/no dissection: ED physician discussed case with on-call vascular surgeon Dr. Donzetta Matters who recommended outpatient follow-up.  Will hold Eliquis and Plavix for now for possible GI bleed.  Symptomatic anemia/possible upper GI bleed: With a history of melena and no hematochezia and no hematemesis, this is likely upper GI bleed.  Patient's hemoglobin is 9.7.  No indication of blood transfusion.  He was seen by Dr. Scarlette Shorts of [facility] and they plan to follow him in the hospital as they had sent him to the ED.  I have sent a secure chat message to Dr. Scarlette Shorts as well.  I will hold his Plavix and Eliquis at this point in time.  Repeat CBC in the morning.  Essential hypertension: Blood pressure stable.  Resume home medications.  Permanent atrial fibrillation: Rate controlled.  Resume all home medications except Eliquis.  Peripheral neuropathy: Resume gabapentin.  CAD s/p stent: Asymptomatic.  Resume all home medications but Plavix.  History of stroke: Asymptomatic.  Paraplegic due to Guillain-Barr syndrome.  Hold Plavix for now.  Hyperlipidemia: Resume home statin.  DVT prophylaxis: SCDs Start: 12/14/19 1527 Code Status: Full code Family Communication: Wife present at bedside.  Plan of care discussed with patient in length and he verbalized understanding and agreed with it. Disposition Plan: Likely discharge in next  1 to 2 days Consults called: GI/sent a secure chat message to Dr. Scarlette Shorts of [facility] Admission status: Observation   Status is: Observation  The patient remains OBS appropriate and will d/c before 2 midnights.  Dispo: The patient is from: Home              Anticipated d/c is to: Home              Anticipated d/c date is: 1 day              Patient currently is not medically stable to d/c.       Darliss Cheney MD [facility]  12/14/2019, 3:29 PM  To contact the attending provider between 7A-7P or the covering provider during after hours 7P-7A, please log into the web site [email or web address]

## 2019-12-15 DIAGNOSIS — Z20822 Contact with and (suspected) exposure to covid-19: Secondary | ICD-10-CM | POA: Diagnosis present

## 2019-12-15 DIAGNOSIS — D649 Anemia, unspecified: Secondary | ICD-10-CM | POA: Diagnosis present

## 2019-12-15 DIAGNOSIS — K921 Melena: Secondary | ICD-10-CM | POA: Diagnosis present

## 2019-12-15 DIAGNOSIS — Z7901 Long term (current) use of anticoagulants: Secondary | ICD-10-CM | POA: Diagnosis not present

## 2019-12-15 DIAGNOSIS — G609 Hereditary and idiopathic neuropathy, unspecified: Secondary | ICD-10-CM | POA: Diagnosis present

## 2019-12-15 DIAGNOSIS — Z7902 Long term (current) use of antithrombotics/antiplatelets: Secondary | ICD-10-CM | POA: Diagnosis not present

## 2019-12-15 DIAGNOSIS — E538 Deficiency of other specified B group vitamins: Secondary | ICD-10-CM | POA: Diagnosis present

## 2019-12-15 DIAGNOSIS — Z8249 Family history of ischemic heart disease and other diseases of the circulatory system: Secondary | ICD-10-CM | POA: Diagnosis not present

## 2019-12-15 DIAGNOSIS — I2510 Atherosclerotic heart disease of native coronary artery without angina pectoris: Secondary | ICD-10-CM | POA: Diagnosis present

## 2019-12-15 DIAGNOSIS — F329 Major depressive disorder, single episode, unspecified: Secondary | ICD-10-CM | POA: Diagnosis present

## 2019-12-15 DIAGNOSIS — I4891 Unspecified atrial fibrillation: Secondary | ICD-10-CM | POA: Diagnosis not present

## 2019-12-15 DIAGNOSIS — I10 Essential (primary) hypertension: Secondary | ICD-10-CM | POA: Diagnosis present

## 2019-12-15 DIAGNOSIS — Z885 Allergy status to narcotic agent status: Secondary | ICD-10-CM | POA: Diagnosis not present

## 2019-12-15 DIAGNOSIS — G8222 Paraplegia, incomplete: Secondary | ICD-10-CM | POA: Diagnosis present

## 2019-12-15 DIAGNOSIS — I252 Old myocardial infarction: Secondary | ICD-10-CM | POA: Diagnosis not present

## 2019-12-15 DIAGNOSIS — Z86011 Personal history of benign neoplasm of the brain: Secondary | ICD-10-CM | POA: Diagnosis not present

## 2019-12-15 DIAGNOSIS — Z79899 Other long term (current) drug therapy: Secondary | ICD-10-CM | POA: Diagnosis not present

## 2019-12-15 DIAGNOSIS — I4821 Permanent atrial fibrillation: Secondary | ICD-10-CM | POA: Diagnosis present

## 2019-12-15 DIAGNOSIS — D62 Acute posthemorrhagic anemia: Secondary | ICD-10-CM | POA: Diagnosis present

## 2019-12-15 DIAGNOSIS — R195 Other fecal abnormalities: Secondary | ICD-10-CM | POA: Diagnosis not present

## 2019-12-15 DIAGNOSIS — Z888 Allergy status to other drugs, medicaments and biological substances status: Secondary | ICD-10-CM | POA: Diagnosis not present

## 2019-12-15 DIAGNOSIS — I714 Abdominal aortic aneurysm, without rupture: Secondary | ICD-10-CM | POA: Diagnosis present

## 2019-12-15 DIAGNOSIS — K922 Gastrointestinal hemorrhage, unspecified: Secondary | ICD-10-CM | POA: Diagnosis not present

## 2019-12-15 DIAGNOSIS — Z8669 Personal history of other diseases of the nervous system and sense organs: Secondary | ICD-10-CM | POA: Diagnosis not present

## 2019-12-15 DIAGNOSIS — K317 Polyp of stomach and duodenum: Secondary | ICD-10-CM | POA: Diagnosis present

## 2019-12-15 DIAGNOSIS — Z8673 Personal history of transient ischemic attack (TIA), and cerebral infarction without residual deficits: Secondary | ICD-10-CM | POA: Diagnosis not present

## 2019-12-15 DIAGNOSIS — E785 Hyperlipidemia, unspecified: Secondary | ICD-10-CM | POA: Diagnosis present

## 2019-12-15 DIAGNOSIS — I480 Paroxysmal atrial fibrillation: Secondary | ICD-10-CM | POA: Diagnosis not present

## 2019-12-15 DIAGNOSIS — Z993 Dependence on wheelchair: Secondary | ICD-10-CM | POA: Diagnosis not present

## 2019-12-15 LAB — CBC
HCT: 30 % — ABNORMAL LOW (ref 39.0–52.0)
Hemoglobin: 9.2 g/dL — ABNORMAL LOW (ref 13.0–17.0)
MCH: 28.1 pg (ref 26.0–34.0)
MCHC: 30.7 g/dL (ref 30.0–36.0)
MCV: 91.7 fL (ref 80.0–100.0)
Platelets: 197 10*3/uL (ref 150–400)
RBC: 3.27 MIL/uL — ABNORMAL LOW (ref 4.22–5.81)
RDW: 13 % (ref 11.5–15.5)
WBC: 6.6 10*3/uL (ref 4.0–10.5)
nRBC: 0 % (ref 0.0–0.2)

## 2019-12-15 LAB — BASIC METABOLIC PANEL
Anion gap: 7 (ref 5–15)
BUN: 20 mg/dL (ref 8–23)
CO2: 28 mmol/L (ref 22–32)
Calcium: 8.5 mg/dL — ABNORMAL LOW (ref 8.9–10.3)
Chloride: 104 mmol/L (ref 98–111)
Creatinine, Ser: 0.7 mg/dL (ref 0.61–1.24)
GFR calc Af Amer: >60 mL/min (ref >60)
GFR calc non Af Amer: >60 mL/min (ref >60)
Glucose, Bld: 99 mg/dL (ref 70–99)
Potassium: 4.1 mmol/L (ref 3.5–5.1)
Sodium: 139 mmol/L (ref 135–145)

## 2019-12-15 LAB — FOLATE: Folate: 19.6 ng/mL (ref >5.9)

## 2019-12-15 LAB — IRON AND TIBC
Iron: 33 ug/dL — ABNORMAL LOW (ref 45–182)
Saturation Ratios: 10 % — ABNORMAL LOW (ref 17.9–39.5)
TIBC: 341 ug/dL (ref 250–450)
UIBC: 308 ug/dL

## 2019-12-15 LAB — PROTIME-INR
INR: 1.4 — ABNORMAL HIGH (ref 0.8–1.2)
Prothrombin Time: 16.4 seconds — ABNORMAL HIGH (ref 11.4–15.2)

## 2019-12-15 LAB — VITAMIN B12: Vitamin B-12: 62 pg/mL — ABNORMAL LOW (ref 180–914)

## 2019-12-15 LAB — FERRITIN: Ferritin: 13 ng/mL — ABNORMAL LOW (ref 24–336)

## 2019-12-15 NOTE — Progress Notes (Signed)
PROGRESS NOTE    Theodore Mendoza  TIW:[id number] DOB: 24-Dec-1940 DOA: 12/14/2019 PCP: Loman Brooklyn, FNP   Brief Narrative:  Theodore Mendoza is a 79 y.o. male with medical history significant of hypertension, hypercholesterolemia, coronary artery disease, NSTEMI s/p PTCA distal cx 05/2006, paroxysmal atrial fibrillation 03/2019 s/p cardioversion 05/27/2019 on Eliquis, TIA vs CVA7/2017 on Plavix, neuropathy, Guillain Barre syndrome 1987 and meningioma x 2 followed by neurosurgery who initially went to see gastroenterologist today in the office for having black stools for last 2 to 3 months and having some dizziness for last 4 to 5 weeks.  Reportedly over at the office, he was hypotensive and symptomatic with dizziness and an abdominal mass was felt on clinical examination and they suspected aortic aneurysm so he was sent to the ED for further evaluation.  ED Course: Upon arrival to ED, patient was hemodynamically stable without having any other symptoms other than dizziness.  CT angiogram of abdomen and pelvis with contrast and without contrast was done which showed abdominal aortic aneurysm with a size of 4.5 cm but no dissection.  ED physician had discussed case with on-call vascular surgeon Dr. Vella Redhead who recommended outpatient follow-up.  Assessment & Plan:   Active Problems:   Essential (primary) hypertension   Hyperlipidemia   History of TIA (transient ischemic attack)   Paraplegia, incomplete (HCC)   Atrial fibrillation (HCC)   Symptomatic anemia   Upper GI bleeding   Large abdominal aortic aneurysm/no dissection: CT angiogram shows ulcerated abdominal aortic aneurysm.  Patient remains asymptomatic.  ED physician discussed case with on-call vascular surgeon Dr. Donzetta Matters who recommended outpatient follow-up.  Continue to hold Eliquis and Plavix.  Per my discussion with Tye Savoy of GI, plan is to do EGD tomorrow morning, if unrevealing, patient will need colonoscopy and at that point in  time, GI will discuss with vascular surgery and will possibly obtain official clearance from them before proceeding with colonoscopy however this is something that we need to worry about after we see what we find on the EGD.  Symptomatic anemia/possible upper GI bleed: With a history of melena and no hematochezia and no hematemesis, this is likely upper GI bleed.  Patient's hemoglobin has remained stable around 9.2.  No indication of blood transfusion.  GI on board.  Plan for EGD tomorrow.  As mentioned above, if this is unrevealing, colonoscopy day after.  Essential hypertension: Blood pressure stable.  Continue home medications.  Permanent atrial fibrillation: Rate controlled.  Continue current regimen.  Eliquis on hold.  Peripheral neuropathy: Continue gabapentin.  CAD s/p stent: Asymptomatic.  Continue all home medications but Plavix.  History of stroke: Asymptomatic.  Paraplegic due to Guillain-Barr syndrome.  Hold Plavix for now.  Hyperlipidemia: Continue home statin.    DVT prophylaxis: SCDs Start: 12/14/19 1527   Code Status: Full Code  Family Communication:  None present at bedside.  Plan of care discussed with patient in length and he verbalized understanding and agreed with it.  Status is: Observation  The patient will require care spanning > 2 midnights and should be moved to inpatient because: Ongoing diagnostic testing needed not appropriate for outpatient work up  Dispo: The patient is from: Home              Anticipated d/c is to: Home              Anticipated d/c date is: 2 days  Patient currently is not medically stable to d/c.        Estimated body mass index is 26.3 kg/m as calculated from the following:   Height as of this encounter: 5\' 8"  (1.727 m).   Weight as of this encounter: 78.5 kg.      Nutritional status:               Consultants:   GI  Procedures:   None  Antimicrobials:  Anti-infectives (From  admission, onward)   None         Subjective: Seen and examined.  He has no complaints.  Objective: Vitals:   12/14/19 2359 12/15/19 0257 12/15/19 0359 12/15/19 0751  BP: 128/73  122/67 (!) 114/59  Pulse: (!) 52  95 93  Resp: 20  18 18   Temp: 98.5 F (36.9 C)  98.2 F (36.8 C) 98.4 F (36.9 C)  TempSrc: Oral  Oral Oral  SpO2: 98%  98% 97%  Weight:  78.5 kg    Height:  5\' 8"  (1.727 m)      Intake/Output Summary (Last 24 hours) at 12/15/2019 1052 Last data filed at 12/15/2019 0600 Gross per 24 hour  Intake 782.5 ml  Output 350 ml  Net 432.5 ml   Filed Weights   12/15/19 0257  Weight: 78.5 kg    Examination:  General exam: Appears calm and comfortable  Respiratory system: Clear to auscultation. Respiratory effort normal. Cardiovascular system: S1 & S2 heard, RRR. No JVD, murmurs, rubs, gallops or clicks. No pedal edema. Gastrointestinal system: Abdomen is nondistended, soft and nontender. No organomegaly or masses felt. Normal bowel sounds heard. Central nervous system: Alert and oriented.  Paraplegic Extremities: Symmetric 5 x 5 power. Skin: No rashes, lesions or ulcers Psychiatry: Judgement and insight appear normal. Mood & affect appropriate.    Data Reviewed: I have personally reviewed following labs and imaging studies  CBC: Recent Labs  Lab 12/14/19 1100 12/15/19 0427  WBC 5.1 6.6  HGB 9.7* 9.2*  HCT 31.2* 30.0*  MCV 91.8 91.7  PLT 204 810   Basic Metabolic Panel: Recent Labs  Lab 12/14/19 1100 12/14/19 1528 12/15/19 0427  NA 144  --  139  K 4.7  --  4.1  CL 106  --  104  CO2 30  --  28  GLUCOSE 95  --  99  BUN 22  --  20  CREATININE 0.65  --  0.70  CALCIUM 8.8*  --  8.5*  MG  --  2.1  --    GFR: Estimated Creatinine Clearance: 70.1 mL/min (by C-G formula based on SCr of 0.7 mg/dL). Liver Function Tests: Recent Labs  Lab 12/14/19 1100  AST 13*  ALT 10  ALKPHOS 57  BILITOT 0.6  PROT 5.7*  ALBUMIN 3.4*   No results for  input(s): LIPASE, AMYLASE in the last 168 hours. No results for input(s): AMMONIA in the last 168 hours. Coagulation Profile: Recent Labs  Lab 12/15/19 0823  INR 1.4*   Cardiac Enzymes: No results for input(s): CKTOTAL, CKMB, CKMBINDEX, TROPONINI in the last 168 hours. BNP (last 3 results) No results for input(s): PROBNP in the last 8760 hours. HbA1C: No results for input(s): HGBA1C in the last 72 hours. CBG: No results for input(s): GLUCAP in the last 168 hours. Lipid Profile: No results for input(s): CHOL, HDL, LDLCALC, TRIG, CHOLHDL, LDLDIRECT in the last 72 hours. Thyroid Function Tests: Recent Labs    12/14/19 1528  TSH 3.241   Anemia Panel:  Recent Labs    12/15/19 0823  VITAMINB12 62*  FOLATE 19.6  FERRITIN 13*  TIBC 341  IRON 33*   Sepsis Labs: No results for input(s): PROCALCITON, LATICACIDVEN in the last 168 hours.  Recent Results (from the past 240 hour(s))  SARS Coronavirus 2 by RT PCR (hospital order, performed in [facility] hospital lab) Nasopharyngeal Nasopharyngeal Swab     Status: None   Collection Time: 12/14/19  1:14 PM   Specimen: Nasopharyngeal Swab  Result Value Ref Range Status   SARS Coronavirus 2 NEGATIVE NEGATIVE Final    Comment: (NOTE) SARS-CoV-2 target nucleic acids are NOT DETECTED.  The SARS-CoV-2 RNA is generally detectable in upper and lower respiratory specimens during the acute phase of infection. The lowest concentration of SARS-CoV-2 viral copies this assay can detect is 250 copies / mL. A negative result does not preclude SARS-CoV-2 infection and should not be used as the sole basis for treatment or other patient management decisions.  A negative result may occur with improper specimen collection / handling, submission of specimen other than nasopharyngeal swab, presence of viral mutation(s) within the areas targeted by this assay, and inadequate number of viral copies (<250 copies / mL). A negative result must be combined  with clinical observations, patient history, and epidemiological information.  Fact Sheet for Patients:   StrictlyIdeas.no  Fact Sheet for Healthcare Providers: BankingDealers.co.za  This test is not yet approved or  cleared by the [place] FDA and has been authorized for detection and/or diagnosis of SARS-CoV-2 by FDA under an Emergency Use Authorization (EUA).  This EUA will remain in effect (meaning this test can be used) for the duration of the COVID-19 declaration under Section 564(b)(1) of the Act, 21 U.S.C. section 360bbb-3(b)(1), unless the authorization is terminated or revoked sooner.  Performed at [facility], [place] [address], [place], [place] [address]       Radiology Studies: CT Angio Abd/Pel W and/or Wo Contrast  Result Date: 12/14/2019 CLINICAL DATA:  Anemia and possible melena. Periumbilical pulsatile abdominal mass on physical exam concerning for abdominal aortic aneurysm which is not associated with acute abdominal pain or tenderness. EXAM: CT ANGIOGRAPHY ABDOMEN AND PELVIS WITH CONTRAST AND WITHOUT CONTRAST TECHNIQUE: Multidetector CT imaging of the abdomen and pelvis was performed using the standard protocol during bolus administration of intravenous contrast. Multiplanar reconstructed images and MIPs were obtained and reviewed to evaluate the vascular anatomy. CONTRAST:  175mL OMNIPAQUE IOHEXOL 350 MG/ML SOLN COMPARISON:  CT of the abdomen and pelvis without contrast on 11/10/2015 FINDINGS: VASCULAR Aorta: Since the prior unenhanced CT, there is progressive enlargement of the infrarenal abdominal aorta with multiple areas of ulcerated plaque and penetrating ulcer disease. Beginning below the level of the renal arteries, prominent penetrating ulcer disease is seen along the right lateral wall of the abdominal aorta causing dilatation of 4 cm. Below the IMA origin, there is a large circumferential  contained extrusion of contrast material consistent with a very large ulcerated plaque causing a broad penetrating ulcer. Maximal aortic diameter at this level is 4.5 cm with maximal transverse diameter of 4.1 cm. This represents enlargement since the prior unenhanced scan in 2017 at which time maximal aortic diameter was approximately 3.4 cm. Penetrating ulcer disease extends to the aortic bifurcation. No evidence of overt aneurysm rupture. Celiac: Stenosis of the proximal celiac trunk approaching 70% in maximal caliber with associated poststenotic dilatation of approximately 11-12 mm. Distal branches are patent. SMA: Proximal calcified plaque present with maximal narrowing  of approximately 30%. Renals: Bilateral single renal arteries. Calcified plaque at the origins of both renal arteries cause moderate range stenoses estimated to be approximately 50-70% bilaterally, likely more significant on the left compared to the right. IMA: Patent. Inflow: Calcified plaque throughout the iliac arteries bilaterally. Component of ulcerated plaque at the origin of the right common iliac artery. No significant iliac aneurysmal disease. Focal stenosis of the proximal to mid left external iliac artery appears significant and is likely approaching 70-80% in caliber. Stenosis of the distal left external iliac artery of approximately 40%. Proximal Outflow: Atherosclerosis of bilateral common femoral arteries without evidence of significant stenosis or aneurysmal disease. Femoral bifurcations demonstrate occlusion of the proximal right SFA and high-grade stenosis of the proximal left SFA. Review of the MIP images confirms the above findings. NON-VASCULAR Lower chest: No acute abnormality. Hepatobiliary: No focal liver abnormality is seen. No gallstones, gallbladder wall thickening, or biliary dilatation. Pancreas: Unremarkable. No pancreatic ductal dilatation or surrounding inflammatory changes. Spleen: Normal in size without focal  abnormality. Adrenals/Urinary Tract: Adrenal glands are unremarkable. Kidneys demonstrate no hydronephrosis. Lower pole renal cyst on the right seen previously in 2017 demonstrates slight enlargement and interval development of some partial rim calcification. The bladder is unremarkable. Stomach/Bowel: Bowel shows no evidence obstruction, ileus, lesion or inflammation. No free intraperitoneal air identified. Lymphatic: No enlarged abdominal or pelvic lymph nodes. Reproductive: Prostate is unremarkable. Other: No abdominal wall hernia or abnormality. No abdominopelvic ascites. Musculoskeletal: No fracture is seen. IMPRESSION: 1. Significant interval enlargement of the infrarenal abdominal aorta with multiple areas of ulcerated plaque and penetrating ulcer disease present. There is a large circumferential contained extrusion of contrast material along the anterior margin of the distal abdominal aorta consistent with a very large ulcerated plaque causing a broad penetrating ulcer. Maximal aortic diameter at this level is 4.5 cm with maximal transverse diameter of 4.1 cm. No evidence of overt aneurysm rupture. The aorta measured approximately 3.4 cm in 2017. Given interval aortic enlargement as well as potential unstable nature of plaque and penetrating ulcer disease, recommend elective referral to Vascular Surgery for assessment of aortic treatment options. 2. Moderate stenosis at the origins of both renal arteries estimated to be approximately 50-70% bilaterally, likely more significant on the left compared to the right. 3. Significant stenosis of the proximal to mid left external iliac artery approaching 70-80% in caliber. 4. Occlusion of the proximal right SFA and high-grade stenosis of the proximal left SFA. 5. 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 6. Recommend referral to a vascular specialist. This recommendation follows ACR consensus guidelines: White Paper of the ACR  Incidental Findings Committee II on Vascular Findings. J Am Coll Radiol 2013; 10:789-794. Electronically Signed   By: Aletta Edouard M.D.   On: 12/14/2019 14:02    Scheduled Meds: . amLODipine  5 mg Oral Daily  . escitalopram  10 mg Oral QHS  . ferrous sulfate  325 mg Oral Daily  . gabapentin  300 mg Oral BID  . lisinopril  5 mg Oral Daily  . metoprolol tartrate  12.5 mg Oral Daily  . pravastatin  40 mg Oral QPC lunch  . tamsulosin  0.4 mg Oral Daily   Continuous Infusions: . sodium chloride 75 mL/hr at 12/14/19 1934     LOS: 0 days   Time spent: 30 minutes   Darliss Cheney, MD [facility]  12/15/2019, 10:52 AM   To contact the attending provider between 7A-7P or the covering  provider during after hours 7P-7A, please log into the web site [email or web address].

## 2019-12-15 NOTE — Anesthesia Preprocedure Evaluation (Addendum)
Anesthesia Evaluation  Patient identified by MRN, date of birth, ID band Patient awake    Reviewed: Allergy & Precautions, NPO status , Patient's Chart, lab work & pertinent test results  History of Anesthesia Complications Negative for: history of anesthetic complications  Airway Mallampati: II  TM Distance: >3 FB Neck ROM: Full    Dental  (+) Poor Dentition, Dental Advisory Given   Pulmonary neg pulmonary ROS,    Pulmonary exam normal        Cardiovascular hypertension, Pt. on medications and Pt. on home beta blockers + CAD, + Past MI, + Cardiac Stents and +CHF  Normal cardiovascular exam+ dysrhythmias Atrial Fibrillation   Study Conclusions   - Left ventricle: The cavity size was normal. There was moderate  focal basal hypertrophy of the septum. Systolic function was  normal. The estimated ejection fraction was in the range of 55%  to 60%. Wall motion was normal; there were no regional wall  motion abnormalities. Doppler parameters are consistent with  abnormal left ventricular relaxation (grade 1 diastolic  dysfunction).    Neuro/Psych Hx of Brain Ca Paraplegic due to Guillain-Barre TIA   GI/Hepatic negative GI ROS, Neg liver ROS,   Endo/Other  negative endocrine ROS  Renal/GU negative Renal ROS     Musculoskeletal negative musculoskeletal ROS (+)   Abdominal   Peds  Hematology negative hematology ROS (+)   Anesthesia Other Findings   Reproductive/Obstetrics                            Anesthesia Physical Anesthesia Plan  ASA: III  Anesthesia Plan: MAC   Post-op Pain Management:    Induction:   PONV Risk Score and Plan: 2 and Ondansetron and Propofol infusion  Airway Management Planned: Natural Airway and Simple Face Mask  Additional Equipment:   Intra-op Plan:   Post-operative Plan:   Informed Consent: I have reviewed the patients History and Physical,  chart, labs and discussed the procedure including the risks, benefits and alternatives for the proposed anesthesia with the patient or authorized representative who has indicated his/her understanding and acceptance.     Dental advisory given  Plan Discussed with: Anesthesiologist and CRNA  Anesthesia Plan Comments:        Anesthesia Quick Evaluation

## 2019-12-15 NOTE — H&P (View-Only) (Signed)
Progress Note  Chief Complaint:   anemia      ASSESSMENT / PLAN:    Brief History:  79 year old male with PMH significant for but not limited to hypertension, hypercholesterolemia, CAD/NSTEMI status post distal PTCA, PAF on Eliquis, TIA versus CVA on Plavix, Gilliam Barr syndrome, meningioma x2 followed by neurosurgery.  #Normocytic anemia, FOBT positive stools (late July) on Eliquis and Plavix.    --Hemoglobin has been drifting over the last year with it being 12.6 in October, 10.7 in July, down to 9.2 today.  --Stools dark over last few months but on oral Fe --Eliquis and Plavix on hold. Last dose of Eliquis was yesterday morning. Last dose of Plavix was 12/13/19 --Patient needa endoscopic work-up.  Ideally, given need for anticoagulants, he would get both EGD and colonoscopy at some point.  However, he is at increased risk ( especially for colonoscopy) given age and also infrarenal abdominal aorta findings on CT angio. --Will most likely proceed with EGD tomorrow. The risks and benefits of EGD were discussed and the patient agrees to proceed.  --If EGD unrevealing, will probably proceed with colonoscopy after plavix washout. May need Vascular Surgery to help stratify procedure risks.   --Will give diet today, NPO after MN  #Abnormal CT angio of the abdomen and pelvis with significant interval enlargement of an infrarenal abdominal aorta with multiple areas of ulcerated plaque and penetrating ulcer         SUBJECTIVE:   No complaints. No abdominal pain    OBJECTIVE:    Scheduled inpatient medications:   amLODipine  5 mg Oral Daily   escitalopram  10 mg Oral QHS   ferrous sulfate  325 mg Oral Daily   gabapentin  300 mg Oral BID   lisinopril  5 mg Oral Daily   metoprolol tartrate  12.5 mg Oral Daily   pravastatin  40 mg Oral QPC lunch   tamsulosin  0.4 mg Oral Daily   Continuous inpatient infusions:   sodium chloride 75 mL/hr at 12/14/19 1934   PRN  inpatient medications: acetaminophen **OR** acetaminophen, ondansetron **OR** ondansetron (ZOFRAN) IV  Vital signs in last 24 hours: Temp:  [98 F (36.7 C)-98.5 F (36.9 C)] 98.4 F (36.9 C) (09/14 0751) Pulse Rate:  [36-132] 93 (09/14 0751) Resp:  [12-20] 18 (09/14 0751) BP: (98-152)/(59-95) 114/59 (09/14 0751) SpO2:  [90 %-100 %] 97 % (09/14 0751) Weight:  [78.5 kg] 78.5 kg (09/14 0257) Last BM Date: 12/15/19  Intake/Output Summary (Last 24 hours) at 12/15/2019 0910 Last data filed at 12/15/2019 0600 Gross per 24 hour  Intake 782.5 ml  Output 350 ml  Net 432.5 ml     Physical Exam:   General: Alert male in NAD  Heart:  Regular rate and rhythm, 2+ bilateral pedal edema.   Respiratory: Normal respiratory effort  Abdomen: Soft, nondistended, Nontender. Normal bowel sounds.   Neurologic: Alert and oriented  Psych: Pleasant. Cooperative.   Filed Weights   12/15/19 0257  Weight: 78.5 kg    Intake/Output from previous day: 09/13 0701 - 09/14 0700 In: 782.5 [I.V.:782.5] Out: 350 [Urine:350] Intake/Output this shift: No intake/output data recorded.    Lab Results: Recent Labs    12/14/19 1100 12/15/19 0427  WBC 5.1 6.6  HGB 9.7* 9.2*  HCT 31.2* 30.0*  PLT 204 197   BMET Recent Labs    12/14/19 1100 12/15/19 0427  NA 144 139  K 4.7 4.1  CL 106 104  CO2 30 28  GLUCOSE 95 99  BUN 22 20  CREATININE 0.65 0.70  CALCIUM 8.8* 8.5*   LFT Recent Labs    12/14/19 1100  PROT 5.7*  ALBUMIN 3.4*  AST 13*  ALT 10  ALKPHOS 57  BILITOT 0.6   PT/INR Recent Labs    12/15/19 0823  LABPROT 16.4*  INR 1.4*   Hepatitis Panel No results for input(s): HEPBSAG, HCVAB, HEPAIGM, HEPBIGM in the last 72 hours.  CT Angio Abd/Pel W and/or Wo Contrast  Result Date: 12/14/2019 CLINICAL DATA:  Anemia and possible melena. Periumbilical pulsatile abdominal mass on physical exam concerning for abdominal aortic aneurysm which is not associated with acute abdominal  pain or tenderness. EXAM: CT ANGIOGRAPHY ABDOMEN AND PELVIS WITH CONTRAST AND WITHOUT CONTRAST TECHNIQUE: Multidetector CT imaging of the abdomen and pelvis was performed using the standard protocol during bolus administration of intravenous contrast. Multiplanar reconstructed images and MIPs were obtained and reviewed to evaluate the vascular anatomy. CONTRAST:  131mL OMNIPAQUE IOHEXOL 350 MG/ML SOLN COMPARISON:  CT of the abdomen and pelvis without contrast on 11/10/2015 FINDINGS: VASCULAR Aorta: Since the prior unenhanced CT, there is progressive enlargement of the infrarenal abdominal aorta with multiple areas of ulcerated plaque and penetrating ulcer disease. Beginning below the level of the renal arteries, prominent penetrating ulcer disease is seen along the right lateral wall of the abdominal aorta causing dilatation of 4 cm. Below the IMA origin, there is a large circumferential contained extrusion of contrast material consistent with a very large ulcerated plaque causing a broad penetrating ulcer. Maximal aortic diameter at this level is 4.5 cm with maximal transverse diameter of 4.1 cm. This represents enlargement since the prior unenhanced scan in 2017 at which time maximal aortic diameter was approximately 3.4 cm. Penetrating ulcer disease extends to the aortic bifurcation. No evidence of overt aneurysm rupture. Celiac: Stenosis of the proximal celiac trunk approaching 70% in maximal caliber with associated poststenotic dilatation of approximately 11-12 mm. Distal branches are patent. SMA: Proximal calcified plaque present with maximal narrowing of approximately 30%. Renals: Bilateral single renal arteries. Calcified plaque at the origins of both renal arteries cause moderate range stenoses estimated to be approximately 50-70% bilaterally, likely more significant on the left compared to the right. IMA: Patent. Inflow: Calcified plaque throughout the iliac arteries bilaterally. Component of ulcerated  plaque at the origin of the right common iliac artery. No significant iliac aneurysmal disease. Focal stenosis of the proximal to mid left external iliac artery appears significant and is likely approaching 70-80% in caliber. Stenosis of the distal left external iliac artery of approximately 40%. Proximal Outflow: Atherosclerosis of bilateral common femoral arteries without evidence of significant stenosis or aneurysmal disease. Femoral bifurcations demonstrate occlusion of the proximal right SFA and high-grade stenosis of the proximal left SFA. Review of the MIP images confirms the above findings. NON-VASCULAR Lower chest: No acute abnormality. Hepatobiliary: No focal liver abnormality is seen. No gallstones, gallbladder wall thickening, or biliary dilatation. Pancreas: Unremarkable. No pancreatic ductal dilatation or surrounding inflammatory changes. Spleen: Normal in size without focal abnormality. Adrenals/Urinary Tract: Adrenal glands are unremarkable. Kidneys demonstrate no hydronephrosis. Lower pole renal cyst on the right seen previously in 2017 demonstrates slight enlargement and interval development of some partial rim calcification. The bladder is unremarkable. Stomach/Bowel: Bowel shows no evidence obstruction, ileus, lesion or inflammation. No free intraperitoneal air identified. Lymphatic: No enlarged abdominal or pelvic lymph nodes. Reproductive: Prostate is unremarkable. Other: No abdominal wall hernia or abnormality. No abdominopelvic ascites. Musculoskeletal: No fracture  is seen. IMPRESSION: 1. Significant interval enlargement of the infrarenal abdominal aorta with multiple areas of ulcerated plaque and penetrating ulcer disease present. There is a large circumferential contained extrusion of contrast material along the anterior margin of the distal abdominal aorta consistent with a very large ulcerated plaque causing a broad penetrating ulcer. Maximal aortic diameter at this level is 4.5 cm with  maximal transverse diameter of 4.1 cm. No evidence of overt aneurysm rupture. The aorta measured approximately 3.4 cm in 2017. Given interval aortic enlargement as well as potential unstable nature of plaque and penetrating ulcer disease, recommend elective referral to Vascular Surgery for assessment of aortic treatment options. 2. Moderate stenosis at the origins of both renal arteries estimated to be approximately 50-70% bilaterally, likely more significant on the left compared to the right. 3. Significant stenosis of the proximal to mid left external iliac artery approaching 70-80% in caliber. 4. Occlusion of the proximal right SFA and high-grade stenosis of the proximal left SFA. 5. 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 6. Recommend referral to a vascular specialist. This recommendation follows ACR consensus guidelines: White Paper of the ACR Incidental Findings Committee II on Vascular Findings. J Am Coll Radiol 2013; 10:789-794. Electronically Signed   By: Aletta Edouard M.D.   On: 12/14/2019 14:02      Active Problems:   Essential (primary) hypertension   Hyperlipidemia   History of TIA (transient ischemic attack)   Paraplegia, incomplete (HCC)   Atrial fibrillation (HCC)   Symptomatic anemia   Upper GI bleeding     LOS: 0 days   Tye Savoy ,NP 12/15/2019, 9:10 AM

## 2019-12-15 NOTE — Progress Notes (Signed)
Progress Note  Chief Complaint:   anemia      ASSESSMENT / PLAN:    Brief History:  79 year old male with PMH significant for but not limited to hypertension, hypercholesterolemia, CAD/NSTEMI status post distal PTCA, PAF on Eliquis, TIA versus CVA on Plavix, Gilliam Barr syndrome, meningioma x2 followed by neurosurgery.  #Normocytic anemia, FOBT positive stools (late July) on Eliquis and Plavix.    --Hemoglobin has been drifting over the last year with it being 12.6 in October, 10.7 in July, down to 9.2 today.  --Stools dark over last few months but on oral Fe --Eliquis and Plavix on hold. Last dose of Eliquis was yesterday morning. Last dose of Plavix was 12/13/19 --Patient needa endoscopic work-up.  Ideally, given need for anticoagulants, he would get both EGD and colonoscopy at some point.  However, he is at increased risk ( especially for colonoscopy) given age and also infrarenal abdominal aorta findings on CT angio. --Will most likely proceed with EGD tomorrow. The risks and benefits of EGD were discussed and the patient agrees to proceed.  --If EGD unrevealing, will probably proceed with colonoscopy after plavix washout. May need Vascular Surgery to help stratify procedure risks.   --Will give diet today, NPO after MN  #Abnormal CT angio of the abdomen and pelvis with significant interval enlargement of an infrarenal abdominal aorta with multiple areas of ulcerated plaque and penetrating ulcer         SUBJECTIVE:   No complaints. No abdominal pain    OBJECTIVE:    Scheduled inpatient medications:  . amLODipine  5 mg Oral Daily  . escitalopram  10 mg Oral QHS  . ferrous sulfate  325 mg Oral Daily  . gabapentin  300 mg Oral BID  . lisinopril  5 mg Oral Daily  . metoprolol tartrate  12.5 mg Oral Daily  . pravastatin  40 mg Oral QPC lunch  . tamsulosin  0.4 mg Oral Daily   Continuous inpatient infusions:  . sodium chloride 75 mL/hr at 12/14/19 1934   PRN  inpatient medications: acetaminophen **OR** acetaminophen, ondansetron **OR** ondansetron (ZOFRAN) IV  Vital signs in last 24 hours: Temp:  [98 F (36.7 C)-98.5 F (36.9 C)] 98.4 F (36.9 C) (09/14 0751) Pulse Rate:  [36-132] 93 (09/14 0751) Resp:  [12-20] 18 (09/14 0751) BP: (98-152)/(59-95) 114/59 (09/14 0751) SpO2:  [90 %-100 %] 97 % (09/14 0751) Weight:  [78.5 kg] 78.5 kg (09/14 0257) Last BM Date: 12/15/19  Intake/Output Summary (Last 24 hours) at 12/15/2019 0910 Last data filed at 12/15/2019 0600 Gross per 24 hour  Intake 782.5 ml  Output 350 ml  Net 432.5 ml     Physical Exam:  . General: Alert male in NAD . Heart:  Regular rate and rhythm, 2+ bilateral pedal edema.  [name] Respiratory: Normal respiratory effort . Abdomen: Soft, nondistended, Nontender. Normal bowel sounds.  . Neurologic: Alert and oriented . Psych: Pleasant. Cooperative.   Filed Weights   12/15/19 0257  Weight: 78.5 kg    Intake/Output from previous day: 09/13 0701 - 09/14 0700 In: 782.5 [I.V.:782.5] Out: 350 [Urine:350] Intake/Output this shift: No intake/output data recorded.    Lab Results: Recent Labs    12/14/19 1100 12/15/19 0427  WBC 5.1 6.6  HGB 9.7* 9.2*  HCT 31.2* 30.0*  PLT 204 197   BMET Recent Labs    12/14/19 1100 12/15/19 0427  NA 144 139  K 4.7 4.1  CL 106 104  CO2 30 28  GLUCOSE 95 99  BUN 22 20  CREATININE 0.65 0.70  CALCIUM 8.8* 8.5*   LFT Recent Labs    12/14/19 1100  PROT 5.7*  ALBUMIN 3.4*  AST 13*  ALT 10  ALKPHOS 57  BILITOT 0.6   PT/INR Recent Labs    12/15/19 0823  LABPROT 16.4*  INR 1.4*   Hepatitis Panel No results for input(s): HEPBSAG, HCVAB, HEPAIGM, HEPBIGM in the last 72 hours.  CT Angio Abd/Pel W and/or Wo Contrast  Result Date: 12/14/2019 CLINICAL DATA:  Anemia and possible melena. Periumbilical pulsatile abdominal mass on physical exam concerning for abdominal aortic aneurysm which is not associated with acute abdominal  pain or tenderness. EXAM: CT ANGIOGRAPHY ABDOMEN AND PELVIS WITH CONTRAST AND WITHOUT CONTRAST TECHNIQUE: Multidetector CT imaging of the abdomen and pelvis was performed using the standard protocol during bolus administration of intravenous contrast. Multiplanar reconstructed images and MIPs were obtained and reviewed to evaluate the vascular anatomy. CONTRAST:  133mL OMNIPAQUE IOHEXOL 350 MG/ML SOLN COMPARISON:  CT of the abdomen and pelvis without contrast on 11/10/2015 FINDINGS: VASCULAR Aorta: Since the prior unenhanced CT, there is progressive enlargement of the infrarenal abdominal aorta with multiple areas of ulcerated plaque and penetrating ulcer disease. Beginning below the level of the renal arteries, prominent penetrating ulcer disease is seen along the right lateral wall of the abdominal aorta causing dilatation of 4 cm. Below the IMA origin, there is a large circumferential contained extrusion of contrast material consistent with a very large ulcerated plaque causing a broad penetrating ulcer. Maximal aortic diameter at this level is 4.5 cm with maximal transverse diameter of 4.1 cm. This represents enlargement since the prior unenhanced scan in 2017 at which time maximal aortic diameter was approximately 3.4 cm. Penetrating ulcer disease extends to the aortic bifurcation. No evidence of overt aneurysm rupture. Celiac: Stenosis of the proximal celiac trunk approaching 70% in maximal caliber with associated poststenotic dilatation of approximately 11-12 mm. Distal branches are patent. SMA: Proximal calcified plaque present with maximal narrowing of approximately 30%. Renals: Bilateral single renal arteries. Calcified plaque at the origins of both renal arteries cause moderate range stenoses estimated to be approximately 50-70% bilaterally, likely more significant on the left compared to the right. IMA: Patent. Inflow: Calcified plaque throughout the iliac arteries bilaterally. Component of ulcerated  plaque at the origin of the right common iliac artery. No significant iliac aneurysmal disease. Focal stenosis of the proximal to mid left external iliac artery appears significant and is likely approaching 70-80% in caliber. Stenosis of the distal left external iliac artery of approximately 40%. Proximal Outflow: Atherosclerosis of bilateral common femoral arteries without evidence of significant stenosis or aneurysmal disease. Femoral bifurcations demonstrate occlusion of the proximal right SFA and high-grade stenosis of the proximal left SFA. Review of the MIP images confirms the above findings. NON-VASCULAR Lower chest: No acute abnormality. Hepatobiliary: No focal liver abnormality is seen. No gallstones, gallbladder wall thickening, or biliary dilatation. Pancreas: Unremarkable. No pancreatic ductal dilatation or surrounding inflammatory changes. Spleen: Normal in size without focal abnormality. Adrenals/Urinary Tract: Adrenal glands are unremarkable. Kidneys demonstrate no hydronephrosis. Lower pole renal cyst on the right seen previously in 2017 demonstrates slight enlargement and interval development of some partial rim calcification. The bladder is unremarkable. Stomach/Bowel: Bowel shows no evidence obstruction, ileus, lesion or inflammation. No free intraperitoneal air identified. Lymphatic: No enlarged abdominal or pelvic lymph nodes. Reproductive: Prostate is unremarkable. Other: No abdominal wall hernia or abnormality. No abdominopelvic ascites. Musculoskeletal: No fracture  is seen. IMPRESSION: 1. Significant interval enlargement of the infrarenal abdominal aorta with multiple areas of ulcerated plaque and penetrating ulcer disease present. There is a large circumferential contained extrusion of contrast material along the anterior margin of the distal abdominal aorta consistent with a very large ulcerated plaque causing a broad penetrating ulcer. Maximal aortic diameter at this level is 4.5 cm with  maximal transverse diameter of 4.1 cm. No evidence of overt aneurysm rupture. The aorta measured approximately 3.4 cm in 2017. Given interval aortic enlargement as well as potential unstable nature of plaque and penetrating ulcer disease, recommend elective referral to Vascular Surgery for assessment of aortic treatment options. 2. Moderate stenosis at the origins of both renal arteries estimated to be approximately 50-70% bilaterally, likely more significant on the left compared to the right. 3. Significant stenosis of the proximal to mid left external iliac artery approaching 70-80% in caliber. 4. Occlusion of the proximal right SFA and high-grade stenosis of the proximal left SFA. 5. 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 6. Recommend referral to a vascular specialist. This recommendation follows ACR consensus guidelines: White Paper of the ACR Incidental Findings Committee II on Vascular Findings. J Am Coll Radiol 2013; 10:789-794. Electronically Signed   By: Aletta Edouard M.D.   On: 12/14/2019 14:02      Active Problems:   Essential (primary) hypertension   Hyperlipidemia   History of TIA (transient ischemic attack)   Paraplegia, incomplete (HCC)   Atrial fibrillation (HCC)   Symptomatic anemia   Upper GI bleeding     LOS: 0 days   Tye Savoy ,NP 12/15/2019, 9:10 AM

## 2019-12-15 NOTE — Progress Notes (Signed)
Received report from off going RN, agree with previous assessment, will continue to monitor

## 2019-12-16 ENCOUNTER — Encounter (HOSPITAL_COMMUNITY): Payer: Self-pay | Admitting: Family Medicine

## 2019-12-16 ENCOUNTER — Encounter (HOSPITAL_COMMUNITY)
Admission: EM | Disposition: A | Discharge status: Home / Self Care | Payer: Self-pay | Source: Home / Self Care | Attending: Family Medicine

## 2019-12-16 ENCOUNTER — Inpatient Hospital Stay (HOSPITAL_COMMUNITY): Payer: Medicare Other | Admitting: Anesthesiology

## 2019-12-16 DIAGNOSIS — I714 Abdominal aortic aneurysm, without rupture, unspecified: Secondary | ICD-10-CM

## 2019-12-16 DIAGNOSIS — I10 Essential (primary) hypertension: Secondary | ICD-10-CM

## 2019-12-16 DIAGNOSIS — Z8673 Personal history of transient ischemic attack (TIA), and cerebral infarction without residual deficits: Secondary | ICD-10-CM

## 2019-12-16 DIAGNOSIS — G8222 Paraplegia, incomplete: Secondary | ICD-10-CM

## 2019-12-16 DIAGNOSIS — I4891 Unspecified atrial fibrillation: Secondary | ICD-10-CM

## 2019-12-16 DIAGNOSIS — K317 Polyp of stomach and duodenum: Secondary | ICD-10-CM

## 2019-12-16 DIAGNOSIS — E785 Hyperlipidemia, unspecified: Secondary | ICD-10-CM

## 2019-12-16 HISTORY — PX: POLYPECTOMY: SHX5525

## 2019-12-16 HISTORY — PX: HEMOSTASIS CONTROL: SHX6838

## 2019-12-16 HISTORY — PX: ESOPHAGOGASTRODUODENOSCOPY (EGD) WITH PROPOFOL: SHX5813

## 2019-12-16 HISTORY — PX: HEMOSTASIS CLIP PLACEMENT: SHX6857

## 2019-12-16 LAB — CBC WITH DIFFERENTIAL/PLATELET
Abs Immature Granulocytes: 0.03 10*3/uL (ref 0.00–0.07)
Basophils Absolute: 0.1 10*3/uL (ref 0.0–0.1)
Basophils Relative: 1 %
Eosinophils Absolute: 0.2 10*3/uL (ref 0.0–0.5)
Eosinophils Relative: 3 %
HCT: 27.8 % — ABNORMAL LOW (ref 39.0–52.0)
Hemoglobin: 8.5 g/dL — ABNORMAL LOW (ref 13.0–17.0)
Immature Granulocytes: 1 %
Lymphocytes Relative: 16 %
Lymphs Abs: 0.9 10*3/uL (ref 0.7–4.0)
MCH: 28.4 pg (ref 26.0–34.0)
MCHC: 30.6 g/dL (ref 30.0–36.0)
MCV: 93 fL (ref 80.0–100.0)
Monocytes Absolute: 0.3 10*3/uL (ref 0.1–1.0)
Monocytes Relative: 6 %
Neutro Abs: 3.8 10*3/uL (ref 1.7–7.7)
Neutrophils Relative %: 73 %
Platelets: 183 10*3/uL (ref 150–400)
RBC: 2.99 MIL/uL — ABNORMAL LOW (ref 4.22–5.81)
RDW: 13 % (ref 11.5–15.5)
WBC: 5.2 10*3/uL (ref 4.0–10.5)
nRBC: 0 % (ref 0.0–0.2)

## 2019-12-16 LAB — CBC
HCT: 28 % — ABNORMAL LOW (ref 39.0–52.0)
Hemoglobin: 8.6 g/dL — ABNORMAL LOW (ref 13.0–17.0)
MCH: 28.6 pg (ref 26.0–34.0)
MCHC: 30.7 g/dL (ref 30.0–36.0)
MCV: 93 fL (ref 80.0–100.0)
Platelets: 181 10*3/uL (ref 150–400)
RBC: 3.01 MIL/uL — ABNORMAL LOW (ref 4.22–5.81)
RDW: 12.9 % (ref 11.5–15.5)
WBC: 6.4 10*3/uL (ref 4.0–10.5)
nRBC: 0 % (ref 0.0–0.2)

## 2019-12-16 SURGERY — ESOPHAGOGASTRODUODENOSCOPY (EGD) WITH PROPOFOL
Anesthesia: Monitor Anesthesia Care

## 2019-12-16 MED ORDER — PROPOFOL 500 MG/50ML IV EMUL
INTRAVENOUS | Status: AC
  Filled 2019-12-16: qty 50

## 2019-12-16 MED ORDER — PANTOPRAZOLE SODIUM 40 MG IV SOLR
40.0000 mg | Freq: Two times a day (BID) | INTRAVENOUS | Status: DC
  Administered 2019-12-16 – 2019-12-17 (×4): 40 mg via INTRAVENOUS
  Filled 2019-12-16 (×4): qty 40

## 2019-12-16 MED ORDER — SODIUM CHLORIDE 0.9 % IV SOLN
1000.0000 mg | Freq: Once | INTRAVENOUS | Status: AC
  Administered 2019-12-16: 1000 mg via INTRAVENOUS
  Filled 2019-12-16: qty 20

## 2019-12-16 MED ORDER — SODIUM CHLORIDE 0.9 % IV SOLN
25.0000 mg | Freq: Once | INTRAVENOUS | Status: AC
  Administered 2019-12-16: 25 mg via INTRAVENOUS
  Filled 2019-12-16: qty 0.5

## 2019-12-16 MED ORDER — SUCRALFATE 1 GM/10ML PO SUSP
1.0000 g | Freq: Four times a day (QID) | ORAL | Status: DC
  Administered 2019-12-16 – 2019-12-18 (×8): 1 g via ORAL
  Filled 2019-12-16 (×8): qty 10

## 2019-12-16 MED ORDER — PROPOFOL 10 MG/ML IV BOLUS
INTRAVENOUS | Status: DC | PRN
  Administered 2019-12-16 (×4): 20 mg via INTRAVENOUS
  Administered 2019-12-16: 40 mg via INTRAVENOUS
  Administered 2019-12-16 (×7): 20 mg via INTRAVENOUS

## 2019-12-16 MED ORDER — ONDANSETRON HCL 4 MG/2ML IJ SOLN
INTRAMUSCULAR | Status: DC | PRN
  Administered 2019-12-16: 4 mg via INTRAVENOUS

## 2019-12-16 MED ORDER — ACETAMINOPHEN 325 MG PO TABS
ORAL_TABLET | ORAL | Status: AC
  Administered 2019-12-16: 650 mg via ORAL
  Filled 2019-12-16: qty 2

## 2019-12-16 MED ORDER — LIDOCAINE 2% (20 MG/ML) 5 ML SYRINGE
INTRAMUSCULAR | Status: DC | PRN
  Administered 2019-12-16: 60 mg via INTRAVENOUS

## 2019-12-16 MED ORDER — CYANOCOBALAMIN 1000 MCG/ML IJ SOLN
1000.0000 ug | Freq: Once | INTRAMUSCULAR | Status: AC
  Administered 2019-12-16: 1000 ug via INTRAMUSCULAR
  Filled 2019-12-16: qty 1

## 2019-12-16 SURGICAL SUPPLY — 15 items

## 2019-12-16 NOTE — Progress Notes (Signed)
PROGRESS NOTE  Theodore Mendoza  AOZ:[id number] DOB: 10-21-1940 DOA: 12/14/2019 PCP: Loman Brooklyn, FNP  Brief Narrative: Theodore Mendoza Hennisis a 79 y.o.malewith medical history significant ofhypertension, hypercholesterolemia, coronary artery disease, NSTEMI s/p PTCA distal cx 05/2006, paroxysmal atrial fibrillation 03/2019 s/p cardioversion 05/27/2019 on Eliquis, TIA vs CVA7/2017 on Plavix, neuropathy, Guillain Barre syndrome 1987 and meningioma x 2 followed by neurosurgerywho initially went to see gastroenterologist today in the office for having black stools for last 2 to 3 months and having some dizziness for last 4 to 5 weeks. Reportedly over at the office, he was hypotensive and symptomatic with dizziness and an abdominal mass was felt on clinical examination and they suspected aortic aneurysm so he was sent to the ED for further evaluation.  ED Course:Upon arrival to ED, patient was hemodynamically stable without having any other symptoms other than dizziness. CT angiogram of abdomen and pelvis with contrast and without contrast was done which showed abdominal aortic aneurysm with a size of 4.5 cm but no dissection. ED physician had discussed case with on-call vascular surgeon Dr. Andrey Spearman recommended outpatient follow-up.  Assessment & Plan: Active Problems:   Essential (primary) hypertension   Hyperlipidemia   History of TIA (transient ischemic attack)   Paraplegia, incomplete (HCC)   Atrial fibrillation (HCC)   Symptomatic anemia   Gastrointestinal hemorrhage   Abdominal aortic aneurysm (AAA) without rupture (HCC)   Gastric polyp  GI bleeding due to bleeding gastric polyp. Resected and retrieved on EGD 9/15, suspected source. Clips (MR conditional) were placed. Area noted to be friable.  - NPO except meds with sips - Await pathology results. - Monitor hgb q12h, stable this PM.  - Protonix 40mg  IV BID - Carafate suspension 1gm Q6H - Continue to hold eliquis, plavix per GI -  Discussed case with IR (Dr. Laurence Ferrari), he does not recommend prophylactic embolization of Left gastric. It will be difficult for IR to intervene given the large aortic aneurysm.  Symptomatic acute blood loss anemia: Due to GI bleed with melena.  - Continue monitoring hgb with transfusion threshold of 7g/dl.  - Iron supplementation per GI. Ferritin 13.  Vitamin B12 deficiency: Level is 62.  - Supplement IM and monitor at follow up.   AAA with ulceration: D/w Dr. Trula Slade, [facility]. No dissection noted. Asymptomatic.  - Vascular surgery follow up with repeat CTA in 3 months is recommended by [facility].   Permanent AFib: Rate controlled.  - Holding eliquis for now - Continue metoprolol  CAD s/p PCI: No angina.  - Holding plavix. Continue BB, statin  History of CVA, HLD:  - Holding plavix, continue statin  HTN:  - Continue norvasc, metoprolol, lisinopril  Depression:  - Continue SSRI  Peripheral neuropathy, paraplegia s/p Guillain-Barre Syndrome.  - Continue gabapentin.   DVT prophylaxis: SCDs Code Status: Full Family Communication: None at bedside, pt's wife having surgery for breast CA Disposition Plan:  Status is: Inpatient  Remains inpatient appropriate because:Inpatient level of care appropriate due to severity of illness   Dispo: The patient is from: Home              Anticipated d/c is to: TBD              Anticipated d/c date is: 1 day              Patient currently is not medically stable to d/c.  Consultants:   GI  Vascular surgery  Procedures:   EGD 12/16/2019:  Impression:       -  Z-line regular, 35 cm from the incisors.                           - Gastroesophageal flap valve classified as Hill                            Grade III (minimal fold, loose to endoscope, hiatal                            hernia likely).                           - A single gastric polyp. Resected and retrieved.                            Clips (MR conditional) were placed.                            - Normal examined duodenum.  Recommendation: - Patient has a contact number available for                            emergencies. The signs and symptoms of potential                            delayed complications were discussed with the                            patient. Return to normal activities tomorrow.                            Written discharge instructions were provided to the                            patient.                           - NPO except meds with sips, will advance to clear                            liquid diet later today if he remains                            hemodynamically stable with no further bleeding.                           - Continue present medications.                           - Await pathology results.                           - Repeat CBC this afternoon at 1PM, and continue to                            monitor  Hgb q12h and transfuse as needed                           - Protonix 40mg  IV BID                           - Carafate suspension 1gm Q6H                           - Continue to hold anticoagulation                           - Please consult Vascular surgery                           - Discussed case with IR (Dr. Laurence Ferrari), he does                            not recommend prophylactic embolization of Left                            gastric. It will be difficult for IR to intervene                            given the large aortic aneurysm with ?contained                            partial dissection  Antimicrobials:  None   Subjective: No bleeding noted, ready to have clear liquid diet, no abd pain. No anginal complaints, but limited to bedbound exertion due to hx GBS.   Objective: Vitals:   12/16/19 1130 12/16/19 1140 12/16/19 1150 12/16/19 1212  BP: 137/62 (!) 113/59  (!) 111/57  Pulse: (!) 113 (!) 104 93 81  Resp: (!) 22 (!) 22 16 (!) 21  Temp:    98.2 F (36.8 C)  TempSrc:    Oral  SpO2: 97% 96% 93%  94%  Weight:      Height:        Intake/Output Summary (Last 24 hours) at 12/16/2019 1652 Last data filed at 12/16/2019 1622 Gross per 24 hour  Intake 1582.22 ml  Output 401 ml  Net 1181.22 ml   Filed Weights   12/15/19 0257 12/16/19 0500 12/16/19 0941  Weight: 78.5 kg 71.9 kg 71 kg    Gen: Pleasant, elderly male in no distress.  Pulm: Non-labored breathing. Clear to auscultation bilaterally.  CV: Regular rate and rhythm. No murmur, rub, or gallop. No JVD, no pedal edema. GI: Abdomen soft, palpable pulsating mass with bruit in lower midline, non-tender, non-distended, with normoactive bowel sounds. No organomegaly or masses felt. Ext: Warm, flexion contractures at his baseline per pt. Skin: No rashes, lesions or ulcers Neuro: Alert and oriented. No focal neurological deficits. Psych: Judgement and insight appear normal. Mood & affect appropriate.   Data Reviewed: I have personally reviewed following labs and imaging studies  CBC: Recent Labs  Lab 12/14/19 1100 12/15/19 0427 12/16/19 0429 12/16/19 1544  WBC 5.1 6.6 5.2 6.4  NEUTROABS  --   --  3.8  --   HGB 9.7* 9.2* 8.5* 8.6*  HCT 31.2* 30.0* 27.8* 28.0*  MCV 91.8 91.7 93.0 93.0  PLT 204 197 183 562   Basic Metabolic Panel: Recent Labs  Lab 12/14/19 1100 12/14/19 1528 12/15/19 0427  NA 144  --  139  K 4.7  --  4.1  CL 106  --  104  CO2 30  --  28  GLUCOSE 95  --  99  BUN 22  --  20  CREATININE 0.65  --  0.70  CALCIUM 8.8*  --  8.5*  MG  --  2.1  --    GFR: Estimated Creatinine Clearance: 70.1 mL/min (by C-G formula based on SCr of 0.7 mg/dL). Liver Function Tests: Recent Labs  Lab 12/14/19 1100  AST 13*  ALT 10  ALKPHOS 57  BILITOT 0.6  PROT 5.7*  ALBUMIN 3.4*   No results for input(s): LIPASE, AMYLASE in the last 168 hours. No results for input(s): AMMONIA in the last 168 hours. Coagulation Profile: Recent Labs  Lab 12/15/19 0823  INR 1.4*   Cardiac Enzymes: No results for input(s):  CKTOTAL, CKMB, CKMBINDEX, TROPONINI in the last 168 hours. BNP (last 3 results) No results for input(s): PROBNP in the last 8760 hours. HbA1C: No results for input(s): HGBA1C in the last 72 hours. CBG: No results for input(s): GLUCAP in the last 168 hours. Lipid Profile: No results for input(s): CHOL, HDL, LDLCALC, TRIG, CHOLHDL, LDLDIRECT in the last 72 hours. Thyroid Function Tests: Recent Labs    12/14/19 1528  TSH 3.241   Anemia Panel: Recent Labs    12/15/19 0823  VITAMINB12 62*  FOLATE 19.6  FERRITIN 13*  TIBC 341  IRON 33*   Urine analysis:    Component Value Date/Time   COLORURINE YELLOW 11/10/2015 1500   APPEARANCEUR TURBID (A) 11/10/2015 1500   LABSPEC 1.017 11/10/2015 1500   PHURINE 8.5 (H) 11/10/2015 1500   GLUCOSEU NEGATIVE 11/10/2015 1500   HGBUR NEGATIVE 11/10/2015 1500   BILIRUBINUR NEGATIVE 11/10/2015 1500   KETONESUR NEGATIVE 11/10/2015 1500   PROTEINUR NEGATIVE 11/10/2015 1500   UROBILINOGEN 1.0 05/07/2014 1838   NITRITE POSITIVE (A) 11/10/2015 1500   LEUKOCYTESUR MODERATE (A) 11/10/2015 1500   Recent Results (from the past 240 hour(s))  SARS Coronavirus 2 by RT PCR (hospital order, performed in [place] hospital lab) Nasopharyngeal Nasopharyngeal Swab     Status: None   Collection Time: 12/14/19  1:14 PM   Specimen: Nasopharyngeal Swab  Result Value Ref Range Status   SARS Coronavirus 2 NEGATIVE NEGATIVE Final    Comment: (NOTE) SARS-CoV-2 target nucleic acids are NOT DETECTED.  The SARS-CoV-2 RNA is generally detectable in upper and lower respiratory specimens during the acute phase of infection. The lowest concentration of SARS-CoV-2 viral copies this assay can detect is 250 copies / mL. A negative result does not preclude SARS-CoV-2 infection and should not be used as the sole basis for treatment or other patient management decisions.  A negative result may occur with improper specimen collection / handling, submission of specimen  other than nasopharyngeal swab, presence of viral mutation(s) within the areas targeted by this assay, and inadequate number of viral copies (<250 copies / mL). A negative result must be combined with clinical observations, patient history, and epidemiological information.  Fact Sheet for Patients:   StrictlyIdeas.no  Fact Sheet for Healthcare Providers: BankingDealers.co.za  This test is not yet approved or  cleared by the [place] FDA and has been authorized for detection and/or diagnosis of SARS-CoV-2 by FDA under an Emergency Use Authorization (EUA).  This EUA will remain in effect (meaning this test can be used) for the duration of the COVID-19 declaration under Section 564(b)(1) of the Act, 21 U.S.C. section 360bbb-3(b)(1), unless the authorization is terminated or revoked sooner.  Performed at [facility], [place] [address] [place], [place] [address]       Radiology Studies: No results found.  Scheduled Meds: . amLODipine  5 mg Oral Daily  . cyanocobalamin  1,000 mcg Intramuscular Once  . escitalopram  10 mg Oral QHS  . gabapentin  300 mg Oral BID  . lisinopril  5 mg Oral Daily  . metoprolol tartrate  12.5 mg Oral Daily  . pantoprazole (PROTONIX) IV  40 mg Intravenous Q12H  . pravastatin  40 mg Oral QPC lunch  . sucralfate  1 g Oral Q6H  . tamsulosin  0.4 mg Oral Daily   Continuous Infusions: . sodium chloride 75 mL/hr at 12/16/19 1318  . iron dextran (INFED/DEXFERRUM) infusion     Followed by  . iron dextran (INFED/DEXFERRUM) infusion       LOS: 1 day   Time spent: 25 minutes.  Patrecia Pour, MD [facility] [email or web address] 12/16/2019, 4:52 PM

## 2019-12-16 NOTE — Op Note (Addendum)
[facility] Patient Name: Theodore Mendoza Procedure Date: 12/16/2019 MRN: [id number] Attending MD: Mauri Pole , MD Date of Birth: 06-16-1940 CSN: [id number] Age: 79 Admit Type: Inpatient Procedure:                Upper GI endoscopy Indications:              Suspected upper gastrointestinal bleeding,                            Gastrointestinal bleeding of unknown origin Providers:                Mauri Pole, MD, Wynonia Sours, RN, Cherylynn Ridges, Technician, Marla Roe, CRNA Referring MD:              Medicines:                Monitored Anesthesia Care Complications:            No immediate complications. Estimated Blood Loss:     Estimated blood loss was minimal. Procedure:                Pre-Anesthesia Assessment:                           - Prior to the procedure, a History and Physical                            was performed, and patient medications and                            allergies were reviewed. The patient's tolerance of                            previous anesthesia was also reviewed. The risks                            and benefits of the procedure and the sedation                            options and risks were discussed with the patient.                            All questions were answered, and informed consent                            was obtained. Prior Anticoagulants: The patient has                            taken no previous anticoagulant or antiplatelet                            agents. ASA Grade Assessment: IV - A patient with  severe systemic disease that is a constant threat                            to life. After reviewing the risks and benefits,                            the patient was deemed in satisfactory condition to                            undergo the procedure.                           After obtaining informed consent, the endoscope was                             passed under direct vision. Throughout the                            procedure, the patient's blood pressure, pulse, and                            oxygen saturations were monitored continuously. The                            [id number]-H190 ([id number]) Olympus endoscope was introduced                            through the mouth, and advanced to the second part                            of duodenum. The upper GI endoscopy was                            accomplished without difficulty. The patient                            tolerated the procedure well. Scope In: Scope Out: Findings:      The Z-line was regular and was found 35 cm from the incisors.      The gastroesophageal flap valve was visualized endoscopically and       classified as Hill Grade III (minimal fold, loose to endoscope, hiatal       hernia likely).      A single 20 mm elongated semi-sessile polyp with friable ulcerated       mucosa, bleeding and stigmata of recent bleeding was found in the       cardia. A large endoloop was maneuvered over the polyp stalk and closed       at the mucosal attachment prior to removal in order to prevent bleeding.       Endoloop cut throught the polyp without deploying. This polyp was then       removed with a hot snare. Resection and retrieval were complete. To       prevent bleeding post-intervention, two hemostatic clips were       successfully placed (MR conditional). Persistent slow ooze noted from  surounding friable mucosa.      The exam of the stomach was otherwise normal.      The examined duodenum was normal. Impression:               - Z-line regular, 35 cm from the incisors.                           - Gastroesophageal flap valve classified as Hill                            Grade III (minimal fold, loose to endoscope, hiatal                            hernia likely).                           - A single gastric polyp. Resected and retrieved.                             Clips (MR conditional) were placed.                           - Normal examined duodenum. Moderate Sedation:      Not Applicable - Patient had care per Anesthesia. Recommendation:           - Patient has a contact number available for                            emergencies. The signs and symptoms of potential                            delayed complications were discussed with the                            patient. Return to normal activities tomorrow.                            Written discharge instructions were provided to the                            patient.                           - NPO except meds with sips, will advance to clear                            liquid diet later today if he remains                            hemodynamically stable with no further bleeding.                           - Continue present medications.                           - Await  pathology results.                           - Repeat CBC this afternoon at 1PM, and continue to                            monitor Hgb q12h and transfuse as needed                           - Protonix 40mg  IV BID                           - Carafate suspension 1gm Q6H                           - Continue to hold anticoagulation                           - Please consult Vascular surgery                           - Discussed case with IR (Dr. Laurence Ferrari), he does                            not recommend prophylactic embolization of Left                            gastric. It will be difficult for IR to intervene                            given the large aortic aneurysm with ?contained                            partial dissection Procedure Code(s):        --- Professional ---                           [id number], Esophagogastroduodenoscopy, flexible,                            transoral; with removal of tumor(s), polyp(s), or                            other lesion(s) by snare technique Diagnosis Code(s):        ---  Professional ---                           K31.7, Polyp of stomach and duodenum                           K92.2, Gastrointestinal hemorrhage, unspecified CPT copyright 2019 American Medical Association. All rights reserved. The codes documented in this report are preliminary and upon coder review may  be revised to meet current compliance requirements. Mauri Pole, MD 12/16/2019 11:31:07 AM This report has been signed electronically. Number of Addenda: 0

## 2019-12-16 NOTE — Anesthesia Procedure Notes (Signed)
Date/Time: 12/16/2019 10:18 AM Performed by: Talbot Grumbling, CRNA Oxygen Delivery Method: Simple face mask

## 2019-12-16 NOTE — Progress Notes (Signed)
Resumed care for this pt at 0300. I agree with previous RN's assessment.

## 2019-12-16 NOTE — Progress Notes (Signed)
CT scan reviewed.  He has a 4.5 cm AAA with ulceration.  I will plan on repeating his CTA in 3 months and see him in the office  Theodore Mendoza

## 2019-12-16 NOTE — Plan of Care (Signed)

## 2019-12-16 NOTE — Interval H&P Note (Signed)
History and Physical Interval Note:  12/16/2019 9:39 AM  Theodore Mendoza  has presented today for surgery, with the diagnosis of gastrointestinal bleeding, anemia.  The various methods of treatment have been discussed with the patient and family. After consideration of risks, benefits and other options for treatment, the patient has consented to  Procedure(s): ESOPHAGOGASTRODUODENOSCOPY (EGD) WITH PROPOFOL (N/A) as a surgical intervention.  The patient's history has been reviewed, patient examined, no change in status, stable for surgery.  I have reviewed the patient's chart and labs.  Questions were answered to the patient's satisfaction.     Amelda Hapke

## 2019-12-16 NOTE — Progress Notes (Signed)
MEDICATION RELATED CONSULT NOTE - INITIAL   Pharmacy Consult for IV Iron Indication: Iron deficient anemia  Allergies  Allergen Reactions  . Hydrocodone-Acetaminophen   . Morphine And Related Other (See Comments)    Reaction:  Confusion/weakness/hallucinations  . Valsartan Rash    Patient Measurements: Height: 5\' 8"  (172.7 cm) Weight: 71 kg (156 lb 8.4 oz) IBW/kg (Calculated) : 68.4  Vital Signs: Temp: 98.2 F (36.8 C) (09/15 1212) Temp Source: Oral (09/15 1212) BP: 111/57 (09/15 1212) Pulse Rate: 81 (09/15 1212) Intake/Output from previous day: 09/14 0701 - 09/15 0700 In: 1828.3 [P.O.:120; I.V.:1708.3] Out: 1 [Stool:1] Intake/Output from this shift: Total I/O In: 560 [P.O.:60; I.V.:500] Out: 300 [Urine:250; Blood:50]  Labs: Recent Labs    12/14/19 1100 12/14/19 1528 12/15/19 0427 12/16/19 0429  WBC 5.1  --  6.6 5.2  HGB 9.7*  --  9.2* 8.5*  HCT 31.2*  --  30.0* 27.8*  PLT 204  --  197 183  CREATININE 0.65  --  0.70  --   MG  --  2.1  --   --   ALBUMIN 3.4*  --   --   --   PROT 5.7*  --   --   --   AST 13*  --   --   --   ALT 10  --   --   --   ALKPHOS 57  --   --   --   BILITOT 0.6  --   --   --    Estimated Creatinine Clearance: 70.1 mL/min (by C-G formula based on SCr of 0.7 mg/dL).   Microbiology: Recent Results (from the past 720 hour(s))  SARS Coronavirus 2 by RT PCR (hospital order, performed in [facility] hospital lab) Nasopharyngeal Nasopharyngeal Swab     Status: None   Collection Time: 12/14/19  1:14 PM   Specimen: Nasopharyngeal Swab  Result Value Ref Range Status   SARS Coronavirus 2 NEGATIVE NEGATIVE Final    Comment: (NOTE) SARS-CoV-2 target nucleic acids are NOT DETECTED.  The SARS-CoV-2 RNA is generally detectable in upper and lower respiratory specimens during the acute phase of infection. The lowest concentration of SARS-CoV-2 viral copies this assay can detect is 250 copies / mL. A negative result does not preclude SARS-CoV-2  infection and should not be used as the sole basis for treatment or other patient management decisions.  A negative result may occur with improper specimen collection / handling, submission of specimen other than nasopharyngeal swab, presence of viral mutation(s) within the areas targeted by this assay, and inadequate number of viral copies (<250 copies / mL). A negative result must be combined with clinical observations, patient history, and epidemiological information.  Fact Sheet for Patients:   StrictlyIdeas.no  Fact Sheet for Healthcare Providers: BankingDealers.co.za  This test is not yet approved or  cleared by the [place] FDA and has been authorized for detection and/or diagnosis of SARS-CoV-2 by FDA under an Emergency Use Authorization (EUA).  This EUA will remain in effect (meaning this test can be used) for the duration of the COVID-19 declaration under Section 564(b)(1) of the Act, 21 U.S.C. section 360bbb-3(b)(1), unless the authorization is terminated or revoked sooner.  Performed at [facility], [place] [address], [place], [place] [address]     Medical History: Past Medical History:  Diagnosis Date  . Brain tumor ([place])    x2  . Cardiac arrhythmia   . CHF (congestive heart failure) ([place])   . Guillain-Barre  syndrome ([place]) Feb 13 1986  . Hypertension   . Kidney stones   . Myocardial infarction [facility]) 2007    Assessment: 79 yo M on dual antiplatelet/anticoagulation therapy admit with GI bleed. This has been held and patient is s/p EGD today.  Anemia panel suggests iron deficient-  Hg 9.2 on admit, Iron 33, Ferritin 13. Pharmacy has been consulted for IV iron dosing.   Of note, he does report taking oral iron replacement daily PTA.   Goal of Therapy:  Hg goal 14.8  Plan:  IV Infed 25mg  test dose.   If no reaction, proceed with Infed 1000mg  IV infusion over 6hrs. Monitor Hg. Consider  repeat CBC & anemia panel in 2 weeks. Repeat IV Infed dose if needed.  Resume oral iron supplememt once clinically appropriate.   Netta Cedars PharmD, BCPS 12/16/2019,3:50 PM

## 2019-12-16 NOTE — Transfer of Care (Signed)
Immediate Anesthesia Transfer of Care Note  Patient: Theodore Mendoza  Procedure(s) Performed: ESOPHAGOGASTRODUODENOSCOPY (EGD) WITH PROPOFOL (N/A ) HEMOSTASIS CLIP PLACEMENT HEMOSTASIS CONTROL  Patient Location: PACU  Anesthesia Type:MAC  Level of Consciousness: sedated  Airway & Oxygen Therapy: Patient Spontanous Breathing and Patient connected to face mask oxygen  Post-op Assessment: Report given to RN and Post -op Vital signs reviewed and stable  Post vital signs: Reviewed, stable.  Last Vitals:  Vitals Value Taken Time  BP    Temp    Pulse    Resp    SpO2      Last Pain:  Vitals:   12/16/19 0941  TempSrc: Oral  PainSc:       Patients Stated Pain Goal: 0 (05/02/41 8887)  Complications: No complications documented.

## 2019-12-16 NOTE — Plan of Care (Signed)
  Problem: Education: Goal: Knowledge of General Education information will improve Description Including pain rating scale, medication(s)/side effects and non-pharmacologic comfort measures Outcome: Progressing   

## 2019-12-17 ENCOUNTER — Encounter (HOSPITAL_COMMUNITY): Payer: Self-pay | Admitting: Gastroenterology

## 2019-12-17 LAB — BASIC METABOLIC PANEL
Anion gap: 7 (ref 5–15)
BUN: 18 mg/dL (ref 8–23)
CO2: 24 mmol/L (ref 22–32)
Calcium: 8.3 mg/dL — ABNORMAL LOW (ref 8.9–10.3)
Chloride: 109 mmol/L (ref 98–111)
Creatinine, Ser: 0.64 mg/dL (ref 0.61–1.24)
GFR calc Af Amer: >60 mL/min (ref >60)
GFR calc non Af Amer: >60 mL/min (ref >60)
Glucose, Bld: 72 mg/dL (ref 70–99)
Potassium: 3.6 mmol/L (ref 3.5–5.1)
Sodium: 140 mmol/L (ref 135–145)

## 2019-12-17 LAB — CBC
HCT: 27 % — ABNORMAL LOW (ref 39.0–52.0)
Hemoglobin: 8.2 g/dL — ABNORMAL LOW (ref 13.0–17.0)
MCH: 28.2 pg (ref 26.0–34.0)
MCHC: 30.4 g/dL (ref 30.0–36.0)
MCV: 92.8 fL (ref 80.0–100.0)
Platelets: 168 10*3/uL (ref 150–400)
RBC: 2.91 MIL/uL — ABNORMAL LOW (ref 4.22–5.81)
RDW: 13 % (ref 11.5–15.5)
WBC: 4.4 10*3/uL (ref 4.0–10.5)
nRBC: 0 % (ref 0.0–0.2)

## 2019-12-17 LAB — SURGICAL PATHOLOGY

## 2019-12-17 MED ORDER — CYANOCOBALAMIN 1000 MCG/ML IJ SOLN
1000.0000 ug | Freq: Every day | INTRAMUSCULAR | Status: DC
  Administered 2019-12-17: 1000 ug via INTRAMUSCULAR
  Filled 2019-12-17 (×2): qty 1

## 2019-12-17 NOTE — Progress Notes (Signed)
PROGRESS NOTE  Theodore Mendoza  ZYS:[id number] DOB: Jan 30, 1941 DOA: 12/14/2019 PCP: Loman Brooklyn, FNP  Brief Narrative: Theodore Mendoza a 79 y.o.malewith medical history significant ofhypertension, hypercholesterolemia, coronary artery disease, NSTEMI s/p PTCA distal cx 05/2006, paroxysmal atrial fibrillation 03/2019 s/p cardioversion 05/27/2019 on Eliquis, TIA vs CVA7/2017 on Plavix, neuropathy, Guillain Barre syndrome 1987 and meningioma x 2 followed by neurosurgerywho initially went to see gastroenterologist today in the office for having black stools for last 2 to 3 months and having some dizziness for last 4 to 5 weeks. Reportedly over at the office, he was hypotensive and symptomatic with dizziness and an abdominal mass was felt on clinical examination and they suspected aortic aneurysm so he was sent to the ED for further evaluation.  ED Course:Upon arrival to ED, patient was hemodynamically stable without having any other symptoms other than dizziness. CT angiogram of abdomen and pelvis with contrast and without contrast was done which showed abdominal aortic aneurysm with a size of 4.5 cm but no dissection. ED physician had discussed case with on-call vascular surgeon Dr. Andrey Spearman recommended outpatient follow-up.  Assessment & Plan: Active Problems:   Essential (primary) hypertension   Hyperlipidemia   History of TIA (transient ischemic attack)   Paraplegia, incomplete (HCC)   Atrial fibrillation (HCC)   Symptomatic anemia   Gastrointestinal hemorrhage   Abdominal aortic aneurysm (AAA) without rupture (HCC)   Gastric polyp  GI bleeding due to bleeding gastric polyp. Resected and retrieved on EGD 9/15, suspected source. Clips (MR conditional) were placed. Area noted to be friable and oozing.  - No gross bleeding for sure, but unclear character of last BM. Note BUN declining. - Await pathology results. - Protonix 40mg  IV BID > switch to po at DC. - Carafate suspension  1gm Q6H - Continue to hold eliquis, plavix per GI. Will hold these (risk of thrombosis d/w pt) for several days. - Discussed case with IR (Dr. Laurence Ferrari), he does not recommend prophylactic embolization of Left gastric. It will be difficult for IR to intervene given the large aortic aneurysm.  Symptomatic acute blood loss anemia: Due to GI bleed with melena.  - Hgb trending slightly downward today, will monitor in AM.  - Iron supplementation per GI. Ferritin 13. Giving IV iron. Recommend repeat studies as outpatient, will continue oral iron.  Vitamin B12 deficiency: Level is 62.  - Supplement IM again/daily while admitted and monitor at follow up.   AAA with ulceration: D/w Dr. Trula Slade, [facility]. No dissection noted. Asymptomatic.  - Vascular surgery follow up with repeat CTA in 3 months is recommended by [facility].   Permanent AFib: Rate controlled.  - Holding eliquis for now - Continue metoprolol  CAD s/p PCI: No angina.  - Holding plavix. Continue BB, statin  History of CVA, HLD:  - Holding plavix, continue statin  HTN:  - Continue norvasc, metoprolol, lisinopril  Depression:  - Continue SSRI  Peripheral neuropathy, paraplegia s/p Guillain-Barre Syndrome.  - Continue gabapentin.   DVT prophylaxis: SCDs Code Status: Full Family Communication: None at bedside, pt's wife having surgery for breast CA Disposition Plan:  Status is: Inpatient  Remains inpatient appropriate because:Inpatient level of care appropriate due to severity of illness  Dispo: The patient is from: Home              Anticipated d/c is to: Home              Anticipated d/c date is: 12/18/2019 if blood counts are stable and  no further bleeding. Note that the patient's hgb is lower today than at any point during hospitalization and GI reported ongoing oozing from friable gastric mucosa after EGD. This patient is at very high risk of continued bleeding and subsequent poor outcome.              Patient currently is not  medically stable to d/c.  Consultants:   GI  Vascular surgery  Procedures:   EGD 12/16/2019:  Impression:       - Z-line regular, 35 cm from the incisors.                           - Gastroesophageal flap valve classified as Hill                            Grade III (minimal fold, loose to endoscope, hiatal                            hernia likely).                           - A single gastric polyp. Resected and retrieved.                            Clips (MR conditional) were placed.                           - Normal examined duodenum.  Recommendation: - Patient has a contact number available for                            emergencies. The signs and symptoms of potential                            delayed complications were discussed with the                            patient. Return to normal activities tomorrow.                            Written discharge instructions were provided to the                            patient.                           - NPO except meds with sips, will advance to clear                            liquid diet later today if he remains                            hemodynamically stable with no further bleeding.                           - Continue present medications.                           -  Await pathology results.                           - Repeat CBC this afternoon at 1PM, and continue to                            monitor Hgb q12h and transfuse as needed                           - Protonix 40mg  IV BID                           - Carafate suspension 1gm Q6H                           - Continue to hold anticoagulation                           - Please consult Vascular surgery                           - Discussed case with IR (Dr. Laurence Ferrari), he does                            not recommend prophylactic embolization of Left                            gastric. It will be difficult for IR to intervene                            given the  large aortic aneurysm with ?contained                            partial dissection  Antimicrobials:  None   Subjective: Had BM, doesn't know if there was any blood in it. No abd pain or other complaints. No N/V. Eating ok.   Objective: Vitals:   12/17/19 0447 12/17/19 0500 12/17/19 1008 12/17/19 1308  BP: 117/67  120/64 (!) 101/57  Pulse: 82  80 72  Resp: 18     Temp: 98.3 F (36.8 C)   98.2 F (36.8 C)  TempSrc: Oral   Oral  SpO2: 96%   98%  Weight:  74.8 kg    Height:        Intake/Output Summary (Last 24 hours) at 12/17/2019 1756 Last data filed at 12/17/2019 1741 Gross per 24 hour  Intake 1763.14 ml  Output 300 ml  Net 1463.14 ml   Filed Weights   12/16/19 0500 12/16/19 0941 12/17/19 0500  Weight: 71.9 kg 71 kg 74.8 kg   Gen: Pleasant, elderly male in no distress Pulm: Nonlabored breathing. Clear. CV: Regular rate and rhythm. No murmur, rub, or gallop. No JVD, no dependent edema. GI: Abdomen soft, non-tender, non-distended, with normoactive bowel sounds.  Ext: Warm, well perfused Skin: No rashes, lesions or ulcers on visualized skin. Neuro: Alert and oriented. No new focal neurological deficits. Paraplegia, bilateral hand flexion contractures. Psych: Judgement and insight appear fair. Mood euthymic & affect congruent. Behavior is appropriate.    Data Reviewed:  I have personally reviewed following labs and imaging studies  CBC: Recent Labs  Lab 12/14/19 1100 12/15/19 0427 12/16/19 0429 12/16/19 1544 12/17/19 0447  WBC 5.1 6.6 5.2 6.4 4.4  NEUTROABS  --   --  3.8  --   --   HGB 9.7* 9.2* 8.5* 8.6* 8.2*  HCT 31.2* 30.0* 27.8* 28.0* 27.0*  MCV 91.8 91.7 93.0 93.0 92.8  PLT 204 197 183 181 109   Basic Metabolic Panel: Recent Labs  Lab 12/14/19 1100 12/14/19 1528 12/15/19 0427 12/17/19 0447  NA 144  --  139 140  K 4.7  --  4.1 3.6  CL 106  --  104 109  CO2 30  --  28 24  GLUCOSE 95  --  99 72  BUN 22  --  20 18  CREATININE 0.65  --  0.70 0.64    CALCIUM 8.8*  --  8.5* 8.3*  MG  --  2.1  --   --    GFR: Estimated Creatinine Clearance: 70.1 mL/min (by C-G formula based on SCr of 0.64 mg/dL). Liver Function Tests: Recent Labs  Lab 12/14/19 1100  AST 13*  ALT 10  ALKPHOS 57  BILITOT 0.6  PROT 5.7*  ALBUMIN 3.4*   No results for input(s): LIPASE, AMYLASE in the last 168 hours. No results for input(s): AMMONIA in the last 168 hours. Coagulation Profile: Recent Labs  Lab 12/15/19 0823  INR 1.4*     Anemia Panel: Recent Labs    12/15/19 0823  VITAMINB12 62*  FOLATE 19.6  FERRITIN 13*  TIBC 341  IRON 33*   Urine analysis:    Component Value Date/Time   COLORURINE YELLOW 11/10/2015 1500   APPEARANCEUR TURBID (A) 11/10/2015 1500   LABSPEC 1.017 11/10/2015 1500   PHURINE 8.5 (H) 11/10/2015 1500   GLUCOSEU NEGATIVE 11/10/2015 1500   HGBUR NEGATIVE 11/10/2015 1500   BILIRUBINUR NEGATIVE 11/10/2015 1500   KETONESUR NEGATIVE 11/10/2015 1500   PROTEINUR NEGATIVE 11/10/2015 1500   UROBILINOGEN 1.0 05/07/2014 1838   NITRITE POSITIVE (A) 11/10/2015 1500   LEUKOCYTESUR MODERATE (A) 11/10/2015 1500   Recent Results (from the past 240 hour(s))  SARS Coronavirus 2 by RT PCR (hospital order, performed in [place] hospital lab) Nasopharyngeal Nasopharyngeal Swab     Status: None   Collection Time: 12/14/19  1:14 PM   Specimen: Nasopharyngeal Swab  Result Value Ref Range Status   SARS Coronavirus 2 NEGATIVE NEGATIVE Final    Comment: (NOTE) SARS-CoV-2 target nucleic acids are NOT DETECTED.  The SARS-CoV-2 RNA is generally detectable in upper and lower respiratory specimens during the acute phase of infection. The lowest concentration of SARS-CoV-2 viral copies this assay can detect is 250 copies / mL. A negative result does not preclude SARS-CoV-2 infection and should not be used as the sole basis for treatment or other patient management decisions.  A negative result may occur with improper specimen collection /  handling, submission of specimen other than nasopharyngeal swab, presence of viral mutation(s) within the areas targeted by this assay, and inadequate number of viral copies (<250 copies / mL). A negative result must be combined with clinical observations, patient history, and epidemiological information.  Fact Sheet for Patients:   StrictlyIdeas.no  Fact Sheet for Healthcare Providers: BankingDealers.co.za  This test is not yet approved or  cleared by the [place] FDA and has been authorized for detection and/or diagnosis of SARS-CoV-2 by FDA under an Emergency Use Authorization (EUA).  This EUA will  remain in effect (meaning this test can be used) for the duration of the COVID-19 declaration under Section 564(b)(1) of the Act, 21 U.S.C. section 360bbb-3(b)(1), unless the authorization is terminated or revoked sooner.  Performed at [facility], [place] [address], [place], [place] [address]       Radiology Studies: No results found.  Scheduled Meds: . amLODipine  5 mg Oral Daily  . escitalopram  10 mg Oral QHS  . gabapentin  300 mg Oral BID  . lisinopril  5 mg Oral Daily  . metoprolol tartrate  12.5 mg Oral Daily  . pantoprazole (PROTONIX) IV  40 mg Intravenous Q12H  . pravastatin  40 mg Oral QPC lunch  . sucralfate  1 g Oral Q6H  . tamsulosin  0.4 mg Oral Daily   Continuous Infusions: . sodium chloride 1,000 mL (12/17/19 0924)     LOS: 2 days   Time spent: 25 minutes.  Patrecia Pour, MD [facility] [email or web address] 12/17/2019, 5:56 PM

## 2019-12-17 NOTE — Plan of Care (Signed)

## 2019-12-17 NOTE — Progress Notes (Addendum)
Progress Note  Chief Complaint:   anemia      Attending physician's note   I have taken an interval history, reviewed the chart and examined the patient. I agree with the Advanced Practitioner's note, impression and recommendations.    Multiple comorbidities including CAD, NSTEMI s/p PCI, proximal A. fib, TIA on Plavix and Eliquis  Large ulcerated bleeding gastric polyp s/p removal with hot snare and placement of hemostatic clipsX2. There was some slow oozing from surrounding friable mucosa post polypectomy.  OK to  Restart anticoagulation in 7 days from GI standpoint. Please discuss with cardiology and vascular surgery regarding future anticoagulation, he has AAA with penetrating ulceration  Advance diet as tolerated  Continue to Monitor Hgb and transfuse as needed  Protonix 40mg  BID X3 months, then once daily  Carafate 1gm before meals and at bedtime X 1 month  Follow up in GI office next available appointment in 6-8 weeks   I have spent 35 minutes of patient care (this includes precharting, chart review, review of results, face-to-face time used for counseling as well as treatment plan and follow-up. The patient was provided an opportunity to ask questions and all were answered. The patient agreed with the plan and demonstrated an understanding of the instructions.  Damaris Hippo , MD [phone]     ASSESSMENT / PLAN:    Brief History: 79 year old male with PMH significant for but not limited to hypertension, hypercholesterolemia, CAD/NSTEMI status post distal PTCA, PAF on Eliquis, TIA versus CVA on Plavix, Ethelene Hal syndrome, meningioma x2 followed by neurosurgery.  # Anemia/ GI bleed on Eliquis and Plavix / large ulcerated, bleeding gastric polyp on EGD. --large gastric polyp removed and clip placed yesterday but area continued to ooze. IR didn't recommend embolization of left gastric artery. It would be difficult for them to intervene given the  large aortic aneurysm with ?contained partial dissection. Polyp path is pending.  --Since procedure patient had a BM at 8pm and he says another around 4am but he doesn't know if they contained blood.  --Hemoglobin is down but overall stable at 8.2 so hopefully bleeding has stopped.   --Continue BID PPI and Carafate --No plans for colonoscopy given advanced age, aortic aneurysm and positive EGD findings.  --We would like for anticoagulation to be held for another five days but please clear this with Vascular Surgery.   #Abnormal CT angio of the abdomen and pelvis with significant interval enlargement of an infrarenal abdominal aorta with multiple areas of ulcerated plaque and penetrating ulcer  SUBJECTIVE:   Feels okay. Tolerating clears. BM at 4am but he doesn't know if it contained blood, no BMs since.     OBJECTIVE:     EGD 12/15/19 A single 20 mm elongated semi-sessile polyp with friable ulcerated mucosa, bleeding and stigmata of recent bleeding was found in the cardia. A large endoloop was maneuvered over the polyp stalk and closed at the mucosal attachment prior to removal in order to prevent bleeding. Endoloop cut throught the polyp without deploying. This polyp was then removed with a hot snare. Resection and retrieval were complete. To prevent bleeding post-intervention, two hemostatic clips were successfully placed (MR conditional). Persistent slow ooze noted from surounding friable mucosa. The exam of the stomach was otherwise normal. The examined duodenum was normal.  Discussed case with IR (Dr. Laurence Ferrari), he does not recommend prophylactic embolization of Left gastric. It will be difficult for IR to intervene given the large aortic aneurysm with ?contained partial  dissection   Scheduled inpatient medications:  . amLODipine  5 mg Oral Daily  . escitalopram  10 mg Oral QHS  . gabapentin  300 mg Oral BID  . lisinopril  5 mg Oral Daily  . metoprolol tartrate  12.5 mg  Oral Daily  . pantoprazole (PROTONIX) IV  40 mg Intravenous Q12H  . pravastatin  40 mg Oral QPC lunch  . sucralfate  1 g Oral Q6H  . tamsulosin  0.4 mg Oral Daily   Continuous inpatient infusions:  . sodium chloride Stopped (12/16/19 2127)   PRN inpatient medications: acetaminophen **OR** acetaminophen, ondansetron **OR** ondansetron (ZOFRAN) IV  Vital signs in last 24 hours: Temp:  [98.1 F (36.7 C)-98.8 F (37.1 C)] 98.3 F (36.8 C) (09/16 0447) Pulse Rate:  [77-120] 82 (09/16 0447) Resp:  [16-26] 18 (09/16 0447) BP: (102-138)/(57-70) 117/67 (09/16 0447) SpO2:  [93 %-100 %] 96 % (09/16 0447) Weight:  [71 kg-74.8 kg] 74.8 kg (09/16 0500) Last BM Date: 12/15/19  Intake/Output Summary (Last 24 hours) at 12/17/2019 0902 Last data filed at 12/17/2019 0200 Gross per 24 hour  Intake 1963.14 ml  Output 400 ml  Net 1563.14 ml     Physical Exam:  . General: Alert, male in NAD . Heart:  Regular rate . Pulmonary: Normal respiratory effort . Abdomen: Soft, nondistended, Nontender. Normal bowel sounds. No masses felt. . Neurologic: Alert and oriented . Psych: Pleasant. Cooperative.   Filed Weights   12/16/19 0500 12/16/19 0941 12/17/19 0500  Weight: 71.9 kg 71 kg 74.8 kg    Intake/Output from previous day: 09/15 0701 - 09/16 0700 In: 1963.1 [P.O.:420; I.V.:1075.9; IV Piggyback:467.2] Out: 400 [Urine:350; Blood:50] Intake/Output this shift: No intake/output data recorded.    Lab Results: Recent Labs    12/16/19 0429 12/16/19 1544 12/17/19 0447  WBC 5.2 6.4 4.4  HGB 8.5* 8.6* 8.2*  HCT 27.8* 28.0* 27.0*  PLT 183 181 168   BMET Recent Labs    12/14/19 1100 12/15/19 0427 12/17/19 0447  NA 144 139 140  K 4.7 4.1 3.6  CL 106 104 109  CO2 30 28 24   GLUCOSE 95 99 72  BUN 22 20 18   CREATININE 0.65 0.70 0.64  CALCIUM 8.8* 8.5* 8.3*   LFT Recent Labs    12/14/19 1100  PROT 5.7*  ALBUMIN 3.4*  AST 13*  ALT 10  ALKPHOS 57  BILITOT 0.6   PT/INR Recent  Labs    12/15/19 0823  LABPROT 16.4*  INR 1.4*   Hepatitis Panel No results for input(s): HEPBSAG, HCVAB, HEPAIGM, HEPBIGM in the last 72 hours.  No results found.    Active Problems:   Essential (primary) hypertension   Hyperlipidemia   History of TIA (transient ischemic attack)   Paraplegia, incomplete (HCC)   Atrial fibrillation (HCC)   Symptomatic anemia   Gastrointestinal hemorrhage   Abdominal aortic aneurysm (AAA) without rupture ([place])   Gastric polyp     LOS: 2 days   Tye Savoy ,NP 12/17/2019, 9:02 AM

## 2019-12-18 DIAGNOSIS — I480 Paroxysmal atrial fibrillation: Secondary | ICD-10-CM

## 2019-12-18 DIAGNOSIS — K921 Melena: Principal | ICD-10-CM

## 2019-12-18 LAB — HEMOGLOBIN AND HEMATOCRIT, BLOOD
HCT: 29.2 % — ABNORMAL LOW (ref 39.0–52.0)
Hemoglobin: 8.9 g/dL — ABNORMAL LOW (ref 13.0–17.0)

## 2019-12-18 MED ORDER — SUCRALFATE 1 G PO TABS: 1.0000 g | ORAL_TABLET | Freq: Four times a day (QID) | ORAL | 0 refills | Status: DC

## 2019-12-18 MED ORDER — VITAMIN B-12 1000 MCG PO TABS: 1000.0000 ug | ORAL_TABLET | Freq: Every day | ORAL | 2 refills | Status: AC

## 2019-12-18 MED ORDER — CLOPIDOGREL BISULFATE 75 MG PO TABS: 75.0000 mg | ORAL_TABLET | Freq: Every day | ORAL | Status: DC

## 2019-12-18 MED ORDER — APIXABAN 5 MG PO TABS
5.0000 mg | ORAL_TABLET | Freq: Two times a day (BID) | ORAL | Status: DC
Start: 2019-12-18 — End: 2020-02-11

## 2019-12-18 MED ORDER — METOPROLOL TARTRATE 25 MG PO TABS: 12.5000 mg | ORAL_TABLET | Freq: Every day | ORAL | Status: DC

## 2019-12-18 MED ORDER — PANTOPRAZOLE SODIUM 40 MG PO TBEC: 40.0000 mg | DELAYED_RELEASE_TABLET | Freq: Two times a day (BID) | ORAL | 2 refills | Status: DC

## 2019-12-18 NOTE — Discharge Summary (Signed)
Physician Discharge Summary  Theodore Mendoza [facility] UXL:[id number] DOB: May 16, 1940 DOA: 12/14/2019  PCP: Loman Brooklyn, FNP  Admit date: 12/14/2019 Discharge date: 12/18/2019  Admitted From: Home Disposition: Home   Recommendations for Outpatient Follow-up:  1. Follow up with PCP in 1-2 weeks.  2. Recommend recheck blood counts, B12 level, and iron/ferritin (supplements prescribed at discharge).  3. Follow up with vascular surgery for AAA in 3 months, recommending repeat abdominal CTA. 4. Restart eliquis, plavix in 1 week per GI.  Home Health: None recommended Equipment/Devices: None recommended Discharge Condition: Stable, improved CODE STATUS: Full Diet recommendation: Heart healthy  Brief/Interim Summary: Theodore Mendoza a 78 y.o.malewith medical history significant ofhypertension, hypercholesterolemia, coronary artery disease, NSTEMI s/p PTCA distal cx 05/2006, paroxysmal atrial fibrillation 03/2019 s/p cardioversion 05/27/2019 on Eliquis, TIA vs CVA7/2017 on Plavix, neuropathy, Guillain Barre syndrome 1987 and meningioma x 2 followed by neurosurgerywho initially went to see gastroenterologist today in the office for having black stools for last 2 to 3 months and having some dizziness for last 4 to 5 weeks. Reportedly over at the office, he was hypotensive and symptomatic with dizziness and an abdominal mass was felt on clinical examination and they suspected aortic aneurysm so he was sent to the ED for further evaluation.  ED Course:Upon arrival to ED, patient was hemodynamically stable without having any other symptoms other than dizziness. CT angiogram of abdomen and pelvis with contrast and without contrast was done which showed abdominal aortic aneurysm with a size of 4.5 cm but no dissection. ED physician had discussed case with on-call vascular surgeon Dr. Evalyn Casco recommended outpatient follow-up.  Discharge Diagnoses:  Active Problems:   Essential (primary) hypertension    Hyperlipidemia   History of TIA (transient ischemic attack)   Paraplegia, incomplete (HCC)   Atrial fibrillation (HCC)   Symptomatic anemia   Gastrointestinal hemorrhage   Abdominal aortic aneurysm (AAA) without rupture (HCC)   Gastric polyp  GI bleeding due to bleeding gastric polyp. Resected and retrieved on EGD 9/15, suspected source. Clips (MR conditional) were placed. Area noted to be friable and oozing. Path noted to be benign per GI. - Protonix 40mg  IV BID > switch to po at DC. - Carafate suspension 1gm Q6H - Continue to hold eliquis, plavix per GI. Will hold these (risk of thrombosis d/w pt) for seven days while gastric mucosa heals, then restart unless bleeding noted. - Discussed case with IR (Dr. Laurence Ferrari), he does not recommend prophylactic embolization of Left gastric. It will be difficult for IR to intervene given the large aortic aneurysm. - Has GI follow up scheduled.  Symptomatic acute blood loss anemia: Due to GI bleed with melena.  - Hgb stabilized.  - Iron supplementation per GI. Ferritin 13. Gave IV iron. Recommend repeat studies as outpatient, will continue oral iron.  Vitamin B12 deficiency: Level is 62.  - Supplemented IM daily while admitted and prescribed at discharge. Recommend monitor at follow up.   AAA with ulceration: D/w Drs. Brabham and Cain, [facility]. No dissection noted. Asymptomatic.  - Vascular surgery follow up with repeat CTA in 3 months is recommended by [facility].   Permanent AFib: Rate controlled.  - Holding eliquis for now - Continue metoprolol  CAD s/p PCI: No angina.  - Holding plavix. Continue BB, statin  History of CVA, HLD:  - Holding plavix, continue statin  HTN:  - Continue norvasc, metoprolol, lisinopril  Depression:  - Continue SSRI  Peripheral neuropathy, paraplegia s/p Guillain-Barre Syndrome.  - Continue gabapentin.  Discharge Instructions Discharge Instructions    Diet - low sodium heart healthy   Complete by: As  directed    Discharge instructions   Complete by: As directed    You were admitted for GI bleeding that was found to be due to a bleeding polyp in the stomach. This was removed and there has been no evidence of ongoing bleeding. Your blood counts are stable, so you may be discharged with the following recommendations:  - To heal your stomach lining, continue taking protonix twice daily for 3 months, then once daily AND take carafate 4 times a day for 30 days.  - Follow up with GI in 4 weeks or next available appointment. Their information is in this paperwork.  - To rebuild your blood counts, take iron once daily and take vitamin B12 once daily.  - Hold eliquis and plavix for 1 week and then restart if and only if you have no evidence of bleeding in your stools.  - You will need to follow up with a vascular surgeon and have a repeat CT angiogram of the abdomen and pelvis to maintain surveillance on your abdominal aortic aneurysm. This is an enlargement of the aorta which is worrisome due to risk of rupture. Vascular surgery was consulted while you were here and recommended observation for the time being. Their information is in this paperwork - Make sure to see your PCP in the next 1-2 weeks for repeat labs and hospital follow up. Or seek medical attention right away if you notice any repeat bleeding, shortness of breath, or chest pain.   Increase activity slowly   Complete by: As directed      Allergies as of 12/18/2019      Reactions   Hydrocodone-acetaminophen    Morphine And Related Other (See Comments)   Reaction:  Confusion/weakness/hallucinations   Valsartan Rash      Medication List    TAKE these medications   amLODipine 5 MG tablet Commonly known as: NORVASC TAKE 1 TABLET BY MOUTH DAILY.   apixaban 5 MG Tabs tablet Commonly known as: Eliquis Take 1 tablet (5 mg total) by mouth 2 (two) times daily. restart 7 days after discharge What changed: additional instructions Notes to  patient: Do not take until 9/24   clopidogrel 75 MG tablet Commonly known as: PLAVIX Take 1 tablet (75 mg total) by mouth daily. restart 7 days after DC What changed: additional instructions Notes to patient: Do not take until 9/24   escitalopram 10 MG tablet Commonly known as: LEXAPRO Take 1 tablet (10 mg total) by mouth at bedtime.   gabapentin 300 MG capsule Commonly known as: NEURONTIN Take 1 capsule (300 mg total) by mouth 2 (two) times daily AND 2 capsules (600 mg total) at bedtime.   Iron 325 (65 Fe) MG Tabs Take 1 tablet by mouth daily.   lisinopril 5 MG tablet Commonly known as: ZESTRIL TAKE 1 TABLET ONCE DAILY   metoprolol tartrate 25 MG tablet Commonly known as: LOPRESSOR Take 0.5 tablets (12.5 mg total) by mouth daily. Notes to patient: Changed to 1/2 dose   pantoprazole 40 MG tablet Commonly known as: Protonix Take 1 tablet (40 mg total) by mouth 2 (two) times daily.   pravastatin 40 MG tablet Commonly known as: PRAVACHOL TAKE 1 TABLET ONCE DAILY WITH LUNCH What changed: See the new instructions.   sucralfate 1 g tablet Commonly known as: CARAFATE Take 1 tablet (1 g total) by mouth 4 (four) times daily. Notes to  patient: Separate from other meds by 1-2 hrs   tamsulosin 0.4 MG Caps capsule Commonly known as: FLOMAX Take 0.4 mg by mouth daily.   vitamin B-12 1000 MCG tablet Commonly known as: CYANOCOBALAMIN Take 1 tablet (1,000 mcg total) by mouth daily.       Follow-up Information    Loman Brooklyn, FNP. Schedule an appointment as soon as possible for a visit in 1 week(s).   Specialty: Family Medicine Contact information: [place] [address] [phone]        Noralyn Pick, NP Follow up on 01/29/2020.   Specialty: Gastroenterology Contact information: [place] [address] [phone]              Allergies  Allergen Reactions  . Hydrocodone-Acetaminophen   . Morphine And Related  Other (See Comments)    Reaction:  Confusion/weakness/hallucinations  . Valsartan Rash    Consultations:  Vascular surgery  GI  Procedures/Studies: CT Angio Abd/Pel W and/or Wo Contrast  Result Date: 12/14/2019 CLINICAL DATA:  Anemia and possible melena. Periumbilical pulsatile abdominal mass on physical exam concerning for abdominal aortic aneurysm which is not associated with acute abdominal pain or tenderness. EXAM: CT ANGIOGRAPHY ABDOMEN AND PELVIS WITH CONTRAST AND WITHOUT CONTRAST TECHNIQUE: Multidetector CT imaging of the abdomen and pelvis was performed using the standard protocol during bolus administration of intravenous contrast. Multiplanar reconstructed images and MIPs were obtained and reviewed to evaluate the vascular anatomy. CONTRAST:  177mL OMNIPAQUE IOHEXOL 350 MG/ML SOLN COMPARISON:  CT of the abdomen and pelvis without contrast on 11/10/2015 FINDINGS: VASCULAR Aorta: Since the prior unenhanced CT, there is progressive enlargement of the infrarenal abdominal aorta with multiple areas of ulcerated plaque and penetrating ulcer disease. Beginning below the level of the renal arteries, prominent penetrating ulcer disease is seen along the right lateral wall of the abdominal aorta causing dilatation of 4 cm. Below the IMA origin, there is a large circumferential contained extrusion of contrast material consistent with a very large ulcerated plaque causing a broad penetrating ulcer. Maximal aortic diameter at this level is 4.5 cm with maximal transverse diameter of 4.1 cm. This represents enlargement since the prior unenhanced scan in 2017 at which time maximal aortic diameter was approximately 3.4 cm. Penetrating ulcer disease extends to the aortic bifurcation. No evidence of overt aneurysm rupture. Celiac: Stenosis of the proximal celiac trunk approaching 70% in maximal caliber with associated poststenotic dilatation of approximately 11-12 mm. Distal branches are patent. SMA: Proximal  calcified plaque present with maximal narrowing of approximately 30%. Renals: Bilateral single renal arteries. Calcified plaque at the origins of both renal arteries cause moderate range stenoses estimated to be approximately 50-70% bilaterally, likely more significant on the left compared to the right. IMA: Patent. Inflow: Calcified plaque throughout the iliac arteries bilaterally. Component of ulcerated plaque at the origin of the right common iliac artery. No significant iliac aneurysmal disease. Focal stenosis of the proximal to mid left external iliac artery appears significant and is likely approaching 70-80% in caliber. Stenosis of the distal left external iliac artery of approximately 40%. Proximal Outflow: Atherosclerosis of bilateral common femoral arteries without evidence of significant stenosis or aneurysmal disease. Femoral bifurcations demonstrate occlusion of the proximal right SFA and high-grade stenosis of the proximal left SFA. Review of the MIP images confirms the above findings. NON-VASCULAR Lower chest: No acute abnormality. Hepatobiliary: No focal liver abnormality is seen. No gallstones, gallbladder wall thickening, or biliary dilatation. Pancreas: Unremarkable. No  pancreatic ductal dilatation or surrounding inflammatory changes. Spleen: Normal in size without focal abnormality. Adrenals/Urinary Tract: Adrenal glands are unremarkable. Kidneys demonstrate no hydronephrosis. Lower pole renal cyst on the right seen previously in 2017 demonstrates slight enlargement and interval development of some partial rim calcification. The bladder is unremarkable. Stomach/Bowel: Bowel shows no evidence obstruction, ileus, lesion or inflammation. No free intraperitoneal air identified. Lymphatic: No enlarged abdominal or pelvic lymph nodes. Reproductive: Prostate is unremarkable. Other: No abdominal wall hernia or abnormality. No abdominopelvic ascites. Musculoskeletal: No fracture is seen. IMPRESSION: 1.  Significant interval enlargement of the infrarenal abdominal aorta with multiple areas of ulcerated plaque and penetrating ulcer disease present. There is a large circumferential contained extrusion of contrast material along the anterior margin of the distal abdominal aorta consistent with a very large ulcerated plaque causing a broad penetrating ulcer. Maximal aortic diameter at this level is 4.5 cm with maximal transverse diameter of 4.1 cm. No evidence of overt aneurysm rupture. The aorta measured approximately 3.4 cm in 2017. Given interval aortic enlargement as well as potential unstable nature of plaque and penetrating ulcer disease, recommend elective referral to Vascular Surgery for assessment of aortic treatment options. 2. Moderate stenosis at the origins of both renal arteries estimated to be approximately 50-70% bilaterally, likely more significant on the left compared to the right. 3. Significant stenosis of the proximal to mid left external iliac artery approaching 70-80% in caliber. 4. Occlusion of the proximal right SFA and high-grade stenosis of the proximal left SFA. 5. 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 6. Recommend referral to a vascular specialist. This recommendation follows ACR consensus guidelines: White Paper of the ACR Incidental Findings Committee II on Vascular Findings. J Am Coll Radiol 2013; 10:789-794. Electronically Signed   By: Aletta Edouard M.D.   On: 12/14/2019 14:02    EGD 12/16/2019:  Impression: - Z-line regular, 35 cm from the incisors. - Gastroesophageal flap valve classified as Hill  Grade III (minimal fold, loose to endoscope, hiatal  hernia likely). - A single gastric polyp. Resected and retrieved.  Clips (MR conditional) were placed. - Normal examined  duodenum.  Subjective: Feels well, no stools overnight, no bleeding, no abd pain. Wants to go home.   Discharge Exam: Vitals:   12/17/19 2006 12/18/19 0506  BP: 129/72 131/64  Pulse: (!) 101 79  Resp: 18 18  Temp: 98.6 F (37 C) 97.7 F (36.5 C)  SpO2: 100% 100%   General: Pt is alert, awake, not in acute distress Cardiovascular: RRR, S1/S2 +, no rubs, no gallops Respiratory: CTA bilaterally, no wheezing, no rhonchi Abdominal: Soft, NT, ND, bowel sounds + Extremities: No edema, no cyanosis  Labs: Basic Metabolic Panel: Recent Labs  Lab 12/14/19 1100 12/14/19 1528 12/15/19 0427 12/17/19 0447  NA 144  --  139 140  K 4.7  --  4.1 3.6  CL 106  --  104 109  CO2 30  --  28 24  GLUCOSE 95  --  99 72  BUN 22  --  20 18  CREATININE 0.65  --  0.70 0.64  CALCIUM 8.8*  --  8.5* 8.3*  MG  --  2.1  --   --    Liver Function Tests: Recent Labs  Lab 12/14/19 1100  AST 13*  ALT 10  ALKPHOS 57  BILITOT 0.6  PROT 5.7*  ALBUMIN 3.4*   No results for input(s): LIPASE, AMYLASE in the last 168 hours. No results  for input(s): AMMONIA in the last 168 hours. CBC: Recent Labs  Lab 12/14/19 1100 12/14/19 1100 12/15/19 0427 12/16/19 0429 12/16/19 1544 12/17/19 0447 12/18/19 0518  WBC 5.1  --  6.6 5.2 6.4 4.4  --   NEUTROABS  --   --   --  3.8  --   --   --   HGB 9.7*   < > 9.2* 8.5* 8.6* 8.2* 8.9*  HCT 31.2*   < > 30.0* 27.8* 28.0* 27.0* 29.2*  MCV 91.8  --  91.7 93.0 93.0 92.8  --   PLT 204  --  197 183 181 168  --    < > = values in this interval not displayed.   Cardiac Enzymes: No results for input(s): CKTOTAL, CKMB, CKMBINDEX, TROPONINI in the last 168 hours. BNP: Invalid input(s): POCBNP CBG: No results for input(s): GLUCAP in the last 168 hours. D-Dimer No results for input(s): DDIMER in the last 72 hours. Hgb A1c No results for input(s): HGBA1C in the last 72 hours. Lipid Profile No results for input(s): CHOL, HDL, LDLCALC, TRIG, CHOLHDL, LDLDIRECT in the  last 72 hours. Thyroid function studies No results for input(s): TSH, T4TOTAL, T3FREE, THYROIDAB in the last 72 hours.  Invalid input(s): FREET3 Anemia work up No results for input(s): VITAMINB12, FOLATE, FERRITIN, TIBC, IRON, RETICCTPCT in the last 72 hours. Urinalysis    Component Value Date/Time   COLORURINE YELLOW 11/10/2015 1500   APPEARANCEUR TURBID (A) 11/10/2015 1500   LABSPEC 1.017 11/10/2015 1500   PHURINE 8.5 (H) 11/10/2015 1500   GLUCOSEU NEGATIVE 11/10/2015 1500   HGBUR NEGATIVE 11/10/2015 1500   BILIRUBINUR NEGATIVE 11/10/2015 1500   KETONESUR NEGATIVE 11/10/2015 1500   PROTEINUR NEGATIVE 11/10/2015 1500   UROBILINOGEN 1.0 05/07/2014 1838   NITRITE POSITIVE (A) 11/10/2015 1500   LEUKOCYTESUR MODERATE (A) 11/10/2015 1500    Microbiology Recent Results (from the past 240 hour(s))  SARS Coronavirus 2 by RT PCR (hospital order, performed in [place] hospital lab) Nasopharyngeal Nasopharyngeal Swab     Status: None   Collection Time: 12/14/19  1:14 PM   Specimen: Nasopharyngeal Swab  Result Value Ref Range Status   SARS Coronavirus 2 NEGATIVE NEGATIVE Final    Comment: (NOTE) SARS-CoV-2 target nucleic acids are NOT DETECTED.  The SARS-CoV-2 RNA is generally detectable in upper and lower respiratory specimens during the acute phase of infection. The lowest concentration of SARS-CoV-2 viral copies this assay can detect is 250 copies / mL. A negative result does not preclude SARS-CoV-2 infection and should not be used as the sole basis for treatment or other patient management decisions.  A negative result may occur with improper specimen collection / handling, submission of specimen other than nasopharyngeal swab, presence of viral mutation(s) within the areas targeted by this assay, and inadequate number of viral copies (<250 copies / mL). A negative result must be combined with clinical observations, patient history, and epidemiological information.  Fact  Sheet for Patients:   StrictlyIdeas.no  Fact Sheet for Healthcare Providers: BankingDealers.co.za  This test is not yet approved or  cleared by the [place] FDA and has been authorized for detection and/or diagnosis of SARS-CoV-2 by FDA under an Emergency Use Authorization (EUA).  This EUA will remain in effect (meaning this test can be used) for the duration of the COVID-19 declaration under Section 564(b)(1) of the Act, 21 U.S.C. section 360bbb-3(b)(1), unless the authorization is terminated or revoked sooner.  Performed at [facility], [address]  [address], [place] [address]     Time coordinating discharge: Approximately 40 minutes  Patrecia Pour, MD  [facility] 12/18/2019, 6:00 PM

## 2019-12-18 NOTE — Anesthesia Postprocedure Evaluation (Signed)
Anesthesia Post Note  Patient: Theodore Mendoza  Procedure(s) Performed: ESOPHAGOGASTRODUODENOSCOPY (EGD) WITH PROPOFOL (N/A ) HEMOSTASIS CLIP PLACEMENT HEMOSTASIS CONTROL POLYPECTOMY     Patient location during evaluation: PACU Anesthesia Type: MAC Level of consciousness: awake and alert Pain management: pain level controlled Vital Signs Assessment: post-procedure vital signs reviewed and stable Respiratory status: spontaneous breathing, nonlabored ventilation, respiratory function stable and patient connected to nasal cannula oxygen Cardiovascular status: stable and blood pressure returned to baseline Postop Assessment: no apparent nausea or vomiting Anesthetic complications: no   No complications documented.  Last Vitals:  Vitals:   12/17/19 1308 12/17/19 2006  BP: (!) 101/57 129/72  Pulse: 72 (!) 101  Resp:  18  Temp: 36.8 C 37 C  SpO2: 98% 100%    Last Pain:  Vitals:   12/17/19 2025  TempSrc:   PainSc: 0-No pain                 Tiajuana Amass

## 2019-12-18 NOTE — Progress Notes (Signed)
Gastric polyp was benign. Will discuss results with patient. Hgb overall stable. Continue Protonix and Carafate as recommended on yesterday's round note. He already has an appt with Carl Best, PA on 10/29 at 1:30 pm

## 2019-12-18 NOTE — TOC Transition Note (Signed)
Transition of Care [facility]) - CM/SW Discharge Note   Patient Details  Name: Theodore Mendoza MRN: [id number] Date of Birth: 1940/07/08  [facility] of Care [facility] CM/SW Contact:  Ross Ludwig, LCSW Phone Number: 12/18/2019, 9:56 AM   Clinical Narrative:     Patient discharging today, no reported needs, CSW signing of.         Patient Goals and CMS Choice        Discharge Placement  Patient discharging back home.                     Discharge Plan and Services                                     Social Determinants of Health (SDOH) Interventions     Readmission Risk Interventions No flowsheet data found.

## 2019-12-18 NOTE — Care Management Important Message (Signed)
Important Message  Patient Details IM Letter given to the Patient Name: Theodore Mendoza MRN: [id number] Date of Birth: 03-12-1941   Medicare Important Message Given:  Yes     Kerin Salen 12/18/2019, 10:32 AM

## 2019-12-21 ENCOUNTER — Other Ambulatory Visit: Payer: Self-pay | Admitting: Family Medicine

## 2019-12-21 ENCOUNTER — Telehealth: Payer: Self-pay | Admitting: Family Medicine

## 2019-12-21 DIAGNOSIS — I10 Essential (primary) hypertension: Secondary | ICD-10-CM

## 2019-12-21 DIAGNOSIS — G459 Transient cerebral ischemic attack, unspecified: Secondary | ICD-10-CM

## 2019-12-21 DIAGNOSIS — F419 Anxiety disorder, unspecified: Secondary | ICD-10-CM

## 2019-12-21 NOTE — Telephone Encounter (Signed)
Called twice busy both times will try again later

## 2019-12-22 NOTE — Telephone Encounter (Signed)
FOLLOW UP SCHEDULED

## 2019-12-28 ENCOUNTER — Ambulatory Visit (INDEPENDENT_AMBULATORY_CARE_PROVIDER_SITE_OTHER): Payer: Medicare Other | Admitting: Nurse Practitioner

## 2019-12-28 ENCOUNTER — Encounter: Payer: Self-pay | Admitting: Nurse Practitioner

## 2019-12-28 ENCOUNTER — Other Ambulatory Visit: Payer: Self-pay

## 2019-12-28 VITALS — BP 141/75 | HR 84 | Temp 97.9°F

## 2019-12-28 DIAGNOSIS — I714 Abdominal aortic aneurysm, without rupture, unspecified: Secondary | ICD-10-CM

## 2019-12-28 DIAGNOSIS — Z09 Encounter for follow-up examination after completed treatment for conditions other than malignant neoplasm: Secondary | ICD-10-CM | POA: Insufficient documentation

## 2019-12-28 DIAGNOSIS — I10 Essential (primary) hypertension: Secondary | ICD-10-CM | POA: Diagnosis not present

## 2019-12-28 DIAGNOSIS — R601 Generalized edema: Secondary | ICD-10-CM | POA: Diagnosis not present

## 2019-12-28 MED ORDER — FUROSEMIDE 20 MG PO TABS: 20.0000 mg | ORAL_TABLET | Freq: Every day | ORAL | 1 refills | Status: DC

## 2019-12-28 NOTE — Assessment & Plan Note (Signed)
Patient is reporting new bilateral lower extremity edema hospital discharge.  Bilateral lower extremity edema is getting +3.  With right leg greater than left.  Provided education to patient to elevate feet above heart.  Provided compression wrap in clinic.  Started patient on 20 mg Lasix once daily. Patient will follow up on next visit for recheck labs after starting Lasix. Provided education to patient with printed handout. Rx sent to pharmacy.

## 2019-12-28 NOTE — Progress Notes (Signed)
Established Patient Office Visit  Subjective:  Patient ID: Theodore Mendoza, male    DOB: 27-Jan-1941  Age: 79 y.o. MRN: [id number]  CC:  Chief Complaint  Patient presents with   Hospitalization Follow-up    9/13- [facility]- Gastrointestinal hemorrhage    Leg Swelling    Patient states he has been having bilateral leg and feet swelling since he left the hospital.   Foot Swelling    HPI Theodore Mendoza is a 79 year old male who presents to clinic for hospital discharge follow-up.  Patient was admitted to the hospital 913/21 for gastrointestinal hemorrhage due to bleeding gastric polyp.  Patient had reported earlier to his primary care provider for having black stools for at least 2 to 3 months and having some dizziness for 4 to 5 weeks.  Patient also reported hypotension, dizziness and an abdominal mass which of examination by PCP suspected an aortic aneurysm.  Patient was later sent to the ED CT angiogram of the abdomen and pelvis was completed which showed a 4.5 cm abdominal aortic aneurysm with no dissection.  Patient will follow outpatient vascular surgeon.  Patient is denying nausea vomiting on this visit, denies pain, denies diarrhea or black stool.  Completed hospital follow-up instructions with medication reconciliation completed.  Follow-up visits completed.  Patient and spouse verbalized understanding. Past Medical History:  Diagnosis Date   Brain tumor [facility])    x2   Cardiac arrhythmia    CHF (congestive heart failure) ([place])    Guillain-Barre syndrome ([place]) Feb 13 1986   Hypertension    Kidney stones    Myocardial infarction ([place]) 2007    Past Surgical History:  Procedure Laterality Date   CORONARY ANGIOPLASTY  1997   after mi   CYSTOSCOPY WITH RETROGRADE PYELOGRAM, URETEROSCOPY AND STENT PLACEMENT Left 06/28/2014   Procedure: 1ST STAGE CYSTOSCOPY/URETEROSCOPY/STENT PLACEMENT;  Surgeon: Alexis Frock, MD;  Location: [facility];  Service: Urology;  Laterality: Left;    CYSTOSCOPY WITH STENT PLACEMENT Left 05/08/2014   Procedure: CYSTOSCOPY, RETROGRADE PYELOGRAM WITH LEFT URETERAL STENT PLACEMENT;  Surgeon: Alexis Frock, MD;  Location: [facility];  Service: Urology;  Laterality: Left;   ESOPHAGOGASTRODUODENOSCOPY (EGD) WITH PROPOFOL N/A 12/16/2019   Procedure: ESOPHAGOGASTRODUODENOSCOPY (EGD) WITH PROPOFOL;  Surgeon: Mauri Pole, MD;  Location: [facility];  Service: Endoscopy;  Laterality: N/A;   EYE SURGERY Right may 2015   growth removed, july 2015left eye cataract removed, right eye catarct removed   gamma kniferadiation treatment  Feb 09 2014   [facility]   HEMOSTASIS CLIP PLACEMENT  12/16/2019   Procedure: HEMOSTASIS CLIP PLACEMENT;  Surgeon: Mauri Pole, MD;  Location: [facility];  Service: Endoscopy;;   HEMOSTASIS CONTROL  12/16/2019   Procedure: HEMOSTASIS CONTROL;  Surgeon: Mauri Pole, MD;  Location: [facility];  Service: Endoscopy;;  Endoloop   HOLMIUM LASER APPLICATION Left 06/15/4006   Procedure: HOLMIUM LASER APPLICATION;  Surgeon: Alexis Frock, MD;  Location: [facility];  Service: Urology;  Laterality: Left;   LITHOTRIPSY  years ago   POLYPECTOMY  12/16/2019   Procedure: POLYPECTOMY;  Surgeon: Mauri Pole, MD;  Location: [facility];  Service: Endoscopy;;   STONE EXTRACTION WITH BASKET Left 06/28/2014   Procedure: STONE EXTRACTION WITH BASKET;  Surgeon: Alexis Frock, MD;  Location: [facility];  Service: Urology;  Laterality: Left;    Family History  Problem Relation Age of Onset   Diabetes Mother    Lung cancer Mother    CAD Father  Diabetes Brother    Diabetes Sister    Stroke Sister    Other Maternal Grandfather        brain tumor   Colon cancer Neg Hx     Social History   Socioeconomic History   Marital status: Married    Spouse name: Not on file   Number of children: Not on file   Years of education: Not on file   Highest education level: Not on file    Occupational History   Not on file  Tobacco Use   Smoking status: Never Smoker   Smokeless tobacco: Never Used  Vaping Use   Vaping Use: Never used  Substance and Sexual Activity   Alcohol use: No   Drug use: No   Sexual activity: Not Currently  Other Topics Concern   Not on file  Social History Narrative   Lives with wi   Social Determinants of Health   Financial Resource Strain:    Difficulty of Paying Living Expenses: Not on file  Food Insecurity:    Worried About Charity fundraiser in the Last Year: Not on file   [employer] of Food in the Last Year: Not on file  Transportation Needs:    Lack of Transportation (Medical): Not on file   Lack of Transportation (Non-Medical): Not on file  Physical Activity:    Days of Exercise per Week: Not on file   Minutes of Exercise per Session: Not on file  Stress:    Feeling of Stress : Not on file  Social Connections:    Frequency of Communication with Friends and Family: Not on file   Frequency of Social Gatherings with Friends and Family: Not on file   Attends Religious Services: Not on file   Active Member of Clubs or Organizations: Not on file   Attends Archivist Meetings: Not on file   Marital Status: Not on file  Intimate Partner Violence:    Fear of Current or Ex-Partner: Not on file   Emotionally Abused: Not on file   Physically Abused: Not on file   Sexually Abused: Not on file    Outpatient Medications Prior to Visit  Medication Sig Dispense Refill   amLODipine (NORVASC) 5 MG tablet TAKE 1 TABLET BY MOUTH DAILY. 90 tablet 0   apixaban (ELIQUIS) 5 MG TABS tablet Take 1 tablet (5 mg total) by mouth 2 (two) times daily. restart 7 days after discharge 60 tablet    clopidogrel (PLAVIX) 75 MG tablet TAKE 1 TABLET ONCE DAILY 90 tablet 1   escitalopram (LEXAPRO) 10 MG tablet TAKE 1 TABLET AT BEDTIME 90 tablet 0   Ferrous Sulfate (IRON) 325 (65 Fe) MG TABS Take 1 tablet by mouth  daily.      gabapentin (NEURONTIN) 300 MG capsule Take 1 capsule (300 mg total) by mouth 2 (two) times daily AND 2 capsules (600 mg total) at bedtime. 360 capsule 1   lisinopril (ZESTRIL) 5 MG tablet TAKE 1 TABLET ONCE DAILY (Patient taking differently: Take 5 mg by mouth daily. ) 90 tablet 0   metoprolol tartrate (LOPRESSOR) 25 MG tablet Take 0.5 tablets (12.5 mg total) by mouth daily.     pantoprazole (PROTONIX) 40 MG tablet Take 1 tablet (40 mg total) by mouth 2 (two) times daily. 60 tablet 2   pravastatin (PRAVACHOL) 40 MG tablet TAKE 1 TABLET ONCE DAILY WITH LUNCH (Patient taking differently: Take 40 mg by mouth daily after lunch. ) 90 tablet 0  sucralfate (CARAFATE) 1 g tablet Take 1 tablet (1 g total) by mouth 4 (four) times daily. 120 tablet 0   tamsulosin (FLOMAX) 0.4 MG CAPS capsule Take 0.4 mg by mouth daily.      vitamin B-12 (CYANOCOBALAMIN) 1000 MCG tablet Take 1 tablet (1,000 mcg total) by mouth daily. 30 tablet 2   No facility-administered medications prior to visit.    Allergies  Allergen Reactions   Hydrocodone-Acetaminophen    Morphine And Related Other (See Comments)    Reaction:  Confusion/weakness/hallucinations   Valsartan Rash    ROS Review of Systems  Musculoskeletal: Positive for joint swelling.       Patient wheelchair-bound      Objective:    Physical Exam Vitals reviewed.  Constitutional:      Appearance: Normal appearance.  HENT:     Head: Normocephalic.     Nose: Nose normal.  Eyes:     Conjunctiva/sclera: Conjunctivae normal.  Cardiovascular:     Rate and Rhythm: Normal rate and regular rhythm.     Pulses: Normal pulses.     Heart sounds: Normal heart sounds.  Pulmonary:     Effort: Pulmonary effort is normal.     Breath sounds: Normal breath sounds.  Abdominal:     General: Bowel sounds are normal.  Musculoskeletal:        General: Swelling present.     Cervical back: Normal range of motion.     Right lower leg: Edema  present.     Left lower leg: Edema present.  Skin:    General: Skin is warm.     Findings: Bruising and erythema present.  Neurological:     Mental Status: He is alert and oriented to person, place, and time.  Psychiatric:        Mood and Affect: Mood normal.     BP (!) 141/75    Pulse 84    Temp 97.9 F (36.6 C) (Temporal)    SpO2 99%  Wt Readings from Last 3 Encounters:  12/18/19 163 lb 3.2 oz (74 kg)  12/14/19 173 lb (78.5 kg)  07/22/19 178 lb (80.7 kg)     Lab Results  Component Value Date   TSH 3.241 12/14/2019   Lab Results  Component Value Date   WBC 4.4 12/17/2019   HGB 8.9 (L) 12/18/2019   HCT 29.2 (L) 12/18/2019   MCV 92.8 12/17/2019   PLT 168 12/17/2019   Lab Results  Component Value Date   NA 140 12/17/2019   K 3.6 12/17/2019   CO2 24 12/17/2019   GLUCOSE 72 12/17/2019   BUN 18 12/17/2019   CREATININE 0.64 12/17/2019   BILITOT 0.6 12/14/2019   ALKPHOS 57 12/14/2019   AST 13 (L) 12/14/2019   ALT 10 12/14/2019   PROT 5.7 (L) 12/14/2019   ALBUMIN 3.4 (L) 12/14/2019   CALCIUM 8.3 (L) 12/17/2019   ANIONGAP 7 12/17/2019   Lab Results  Component Value Date   CHOL 104 10/21/2019   Lab Results  Component Value Date   HDL 38 (L) 10/21/2019   Lab Results  Component Value Date   LDLCALC 54 10/21/2019   Lab Results  Component Value Date   TRIG 47 10/21/2019   Lab Results  Component Value Date   CHOLHDL 2.7 10/21/2019   Lab Results  Component Value Date   HGBA1C 5.3 10/02/2015      Assessment & Plan:  Hospital discharge follow-up Patient is following up for hospital discharge after admission to  the hospital 12/14/2019 for gastrointestinal hemorrhage due to bleeding gastric polyp.  Patient had reported earlier to his primary care provider for having black stools for at least 2 to 3 weeks.  Patient reports dizziness, hypotension.  Patient was assessed and found with a 4.5 cm abdominal aortic aneurysm with no dissection.  Patient was sent to the  ED follow-up outpatient for AAA. Completed hospital discharge instructions with medication reconciliation provided education with printed handouts given.  Patient knows to follow-up outpatient with vascular surgeon.  Started back Plavix as ordered. Labs completed today [B12, iron/ferritin, CBC] Follow-up with worsening or unresolved symptoms  Scheduled patient with clinical pharmacist for medication discussion.  Generalized edema Patient is reporting new bilateral lower extremity edema hospital discharge.  Bilateral lower extremity edema is getting +3.  With right leg greater than left.  Provided education to patient to elevate feet above heart.  Provided compression wrap in clinic.  Started patient on 20 mg Lasix once daily. Patient will follow up on next visit for recheck labs after starting Lasix. Provided education to patient with printed handout. Rx sent to pharmacy.  Essential (primary) hypertension Blood pressure is well controlled on current medication no changes needed.  Patient is compliant with treatment and follows up as directed.  Continue to eat a low-sodium heart healthy diet Education provided with given. Patient knows to follow-up on next visit.  Problem List Items Addressed This Visit      Cardiovascular and Mediastinum   Essential (primary) hypertension   Relevant Medications   furosemide (LASIX) 20 MG tablet   Abdominal aortic aneurysm (AAA) without rupture (HCC) - Primary   Relevant Medications   furosemide (LASIX) 20 MG tablet   Other Relevant Orders   CBC with Differential   Vitamin B12   Ferritin     Other   Generalized edema   Relevant Medications   furosemide (LASIX) 20 MG tablet   Hospital discharge follow-up      Meds ordered this encounter  Medications   furosemide (LASIX) 20 MG tablet    Sig: Take 1 tablet (20 mg total) by mouth daily.    Dispense:  30 tablet    Refill:  1    Order Specific Question:   Supervising Provider    Answer:    Caryl Pina A [[id number]]    Follow-up: Return if symptoms worsen or fail to improve.    Ivy Lynn, NP

## 2019-12-28 NOTE — Assessment & Plan Note (Addendum)
Patient is following up for hospital discharge after admission to the hospital 12/14/2019 for gastrointestinal hemorrhage due to bleeding gastric polyp.  Patient had reported earlier to his primary care provider for having black stools for at least 2 to 3 weeks.  Patient reports dizziness, hypotension.  Patient was assessed and found with a 4.5 cm abdominal aortic aneurysm with no dissection.  Patient was sent to the ED follow-up outpatient for AAA. Completed hospital discharge instructions with medication reconciliation provided education with printed handouts given.  Patient knows to follow-up outpatient with vascular surgeon.  Started back Plavix as ordered. Labs completed today [B12, iron/ferritin, CBC] Follow-up with worsening or unresolved symptoms  Scheduled patient with clinical pharmacist for medication discussion.

## 2019-12-28 NOTE — Patient Instructions (Signed)
Hypertension, Adult Hypertension is another name for high blood pressure. High blood pressure forces your heart to work harder to pump blood. This can cause problems over time. There are two numbers in a blood pressure reading. There is a top number (systolic) over a bottom number (diastolic). It is best to have a blood pressure that is below 120/80. Healthy choices can help lower your blood pressure, or you may need medicine to help lower it. What are the causes? The cause of this condition is not known. Some conditions may be related to high blood pressure. What increases the risk?  Smoking.  Having type 2 diabetes mellitus, high cholesterol, or both.  Not getting enough exercise or physical activity.  Being overweight.  Having too much fat, sugar, calories, or salt (sodium) in your diet.  Drinking too much alcohol.  Having long-term (chronic) kidney disease.  Having a family history of high blood pressure.  Age. Risk increases with age.  Race. You may be at higher risk if you are African American.  Gender. Men are at higher risk than women before age 85. After age 20, women are at higher risk than men.  Having obstructive sleep apnea.  Stress. What are the signs or symptoms?  High blood pressure may not cause symptoms. Very high blood pressure (hypertensive crisis) may cause: ? Headache. ? Feelings of worry or nervousness (anxiety). ? Shortness of breath. ? Nosebleed. ? A feeling of being sick to your stomach (nausea). ? Throwing up (vomiting). ? Changes in how you see. ? Very bad chest pain. ? Seizures. How is this treated?  This condition is treated by making healthy lifestyle changes, such as: ? Eating healthy foods. ? Exercising more. ? Drinking less alcohol.  Your health care provider may prescribe medicine if lifestyle changes are not enough to get your blood pressure under control, and if: ? Your top number is above 130. ? Your bottom number is above  80.  Your personal target blood pressure may vary. Follow these instructions at home: Eating and drinking   If told, follow the DASH eating plan. To follow this plan: ? Fill one half of your plate at each meal with fruits and vegetables. ? Fill one fourth of your plate at each meal with whole grains. Whole grains include whole-wheat pasta, brown rice, and whole-grain bread. ? Eat or drink low-fat dairy products, such as skim milk or low-fat yogurt. ? Fill one fourth of your plate at each meal with low-fat (lean) proteins. Low-fat proteins include fish, chicken without skin, eggs, beans, and tofu. ? Avoid fatty meat, cured and processed meat, or chicken with skin. ? Avoid pre-made or processed food.  Eat less than 1,500 mg of salt each day.  Do not drink alcohol if: ? Your doctor tells you not to drink. ? You are pregnant, may be pregnant, or are planning to become pregnant.  If you drink alcohol: ? Limit how much you use to:  0-1 drink a day for women.  0-2 drinks a day for men. ? Be aware of how much alcohol is in your drink. In the [place] one drink equals one 12 oz bottle of beer (355 mL), one 5 oz glass of wine (148 mL), or one 1 oz glass of hard liquor (44 mL). Lifestyle   Work with your doctor to stay at a healthy weight or to lose weight. Ask your doctor what the best weight is for you.  Get at least 30 minutes of exercise most  days of the week. This may include walking, swimming, or biking.  Get at least 30 minutes of exercise that strengthens your muscles (resistance exercise) at least 3 days a week. This may include lifting weights or doing Pilates.  Do not use any products that contain nicotine or tobacco, such as cigarettes, e-cigarettes, and chewing tobacco. If you need help quitting, ask your doctor.  Check your blood pressure at home as told by your doctor.  Keep all follow-up visits as told by your doctor. This is important. Medicines  Take over-the-counter  and prescription medicines only as told by your doctor. Follow directions carefully.  Do not skip doses of blood pressure medicine. The medicine does not work as well if you skip doses. Skipping doses also puts you at risk for problems.  Ask your doctor about side effects or reactions to medicines that you should watch for. Contact a doctor if you:  Think you are having a reaction to the medicine you are taking.  Have headaches that keep coming back (recurring).  Feel dizzy.  Have swelling in your ankles.  Have trouble with your vision. Get help right away if you:  Get a very bad headache.  Start to feel mixed up (confused).  Feel weak or numb.  Feel faint.  Have very bad pain in your: ? Chest. ? Belly (abdomen).  Throw up more than once.  Have trouble breathing. Summary  Hypertension is another name for high blood pressure.  High blood pressure forces your heart to work harder to pump blood.  For most people, a normal blood pressure is less than 120/80.  Making healthy choices can help lower blood pressure. If your blood pressure does not get lower with healthy choices, you may need to take medicine. This information is not intended to replace advice given to you by your health care provider. Make sure you discuss any questions you have with your health care provider. Document Revised: 11/27/2017 Document Reviewed: 11/27/2017 Elsevier Patient Education  [place]. Abdominal Aortic Aneurysm  An aneurysm is a bulge in one of the blood vessels that carry blood away from the heart (artery). It happens when blood pushes up against a weak or damaged place in the wall of an artery. An abdominal aortic aneurysm happens in the main artery of the body (aorta). Some aneurysms may not cause problems. If it grows, it can burst or tear, causing bleeding inside the body. This is an emergency. It needs to be treated right away. What are the causes? The exact cause of this  condition is not known. What increases the risk? The following may make you more likely to get this condition:  Being a male who is 14 years of age or older.  Being white (Caucasian).  Using tobacco.  Having a family history of aneurysms.  Having the following conditions: ? Hardening of the arteries (arteriosclerosis). ? Inflammation of the walls of an artery (arteritis). ? Certain genetic conditions. ? Being very overweight (obesity). ? An infection in the wall of the aorta (infectious aortitis). ? High cholesterol. ? High blood pressure (hypertension). What are the signs or symptoms? Symptoms depend on the size of the aneurysm and how fast it is growing. Most grow slowly and do not cause any symptoms. If symptoms do occur, they may include:  Pain in the belly (abdomen), side, or back.  Feeling full after eating only small amounts of food.  Feeling a throbbing lump in the belly. Symptoms that the aneurysm has  burst (ruptured) include:  Sudden, very bad pain in the belly, side, or back.  Feeling sick to your stomach (nauseous).  Throwing up (vomiting).  Feeling light-headed or passing out. How is this treated? Treatment for this condition depends on:  The size of the aneurysm.  How fast it is growing.  Your age.  Your risk of having it burst. If your aneurysm is smaller than 2 inches (5 cm), your doctor may manage it by:  Checking it often to see if it is getting bigger. You may have an imaging test (ultrasound) to check it every 3-6 months, every year, or every few years.  Giving you medicines to: ? Control blood pressure. ? Treat pain. ? Fight infection. If your aneurysm is larger than 2 inches (5 cm), you may need surgery to fix it. Follow these instructions at home: Lifestyle  Do not use any products that have nicotine or tobacco in them. This includes cigarettes, e-cigarettes, and chewing tobacco. If you need help quitting, ask your doctor.  Get  regular exercise. Ask your doctor what types of exercise are best for you. Eating and drinking  Eat a heart-healthy diet. This includes eating plenty of: ? Fresh fruits and vegetables. ? Whole grains. ? Low-fat (lean) protein. ? Low-fat dairy products.  Avoid foods that are high in saturated fat and cholesterol. These foods include red meat and some dairy products.  Do not drink alcohol if: ? Your doctor tells you not to drink. ? You are pregnant, may be pregnant, or are planning to become pregnant.  If you drink alcohol: ? Limit how much you use to:  0-1 drink a day for women.  0-2 drinks a day for men. ? Be aware of how much alcohol is in your drink. In the [place] one drink equals any of these:  One typical bottle of beer (12 oz).  One-half glass of wine (5 oz).  One shot of hard liquor (1 oz). General instructions  Take over-the-counter and prescription medicines only as told by your doctor.  Keep your blood pressure within normal limits. Ask your doctor what your blood pressure should be.  Have your blood sugar (glucose) level and cholesterol levels checked regularly. Keep your blood sugar level and cholesterol levels within normal limits.  Avoid heavy lifting and activities that take a lot of effort. Ask your doctor what activities are safe for you.  Keep all follow-up visits as told by your doctor. This is important. ? Talk to your doctor about regular screenings to see if the aneurysm is getting bigger. Contact a doctor if you:  Have pain in your belly, side, or back.  Have a throbbing feeling in your belly.  Have a family history of aneurysms. Get help right away if you:  Have sudden, bad pain in your belly, side, or back.  Feel sick to your stomach.  Throw up.  Have trouble pooping (constipation).  Have trouble peeing (urinating).  Feel light-headed.  Have a fast heart rate when you stand.  Have sweaty skin that is cold to the touch  (clammy).  Have shortness of breath.  Have a fever. These symptoms may be an emergency. Do not wait to see if the symptoms will go away. Get medical help right away. Call your local emergency services (911 in the [place] Do not drive yourself to the hospital. Summary  An aneurysm is a bulge in one of the blood vessels that carry blood away from the heart (artery). Some aneurysms may  not cause problems.  You may need to have yours checked often. If it grows, it can burst or tear. This causes bleeding inside the body. It needs to be treated right away.  Follow instructions from your doctor about healthy lifestyle changes.  Keep all follow-up visits as told by your doctor. This is important. This information is not intended to replace advice given to you by your health care provider. Make sure you discuss any questions you have with your health care provider. Document Revised: 07/07/2018 Document Reviewed: 10/26/2017 Elsevier Patient Education  [place].

## 2019-12-28 NOTE — Assessment & Plan Note (Signed)
Blood pressure is well controlled on current medication no changes needed.  Patient is compliant with treatment and follows up as directed.  Continue to eat a low-sodium heart healthy diet Education provided with given. Patient knows to follow-up on next visit.

## 2019-12-29 LAB — CBC WITH DIFFERENTIAL/PLATELET
Basophils Absolute: 0.1 10*3/uL (ref 0.0–0.2)
Basos: 1 %
EOS (ABSOLUTE): 0.2 10*3/uL (ref 0.0–0.4)
Eos: 3 %
Hematocrit: 33.5 % — ABNORMAL LOW (ref 37.5–51.0)
Hemoglobin: 10.1 g/dL — ABNORMAL LOW (ref 13.0–17.7)
Immature Grans (Abs): 0 10*3/uL (ref 0.0–0.1)
Immature Granulocytes: 0 %
Lymphocytes Absolute: 0.9 10*3/uL (ref 0.7–3.1)
Lymphs: 12 %
MCH: 27.2 pg (ref 26.6–33.0)
MCHC: 30.1 g/dL — ABNORMAL LOW (ref 31.5–35.7)
MCV: 90 fL (ref 79–97)
Monocytes Absolute: 0.5 10*3/uL (ref 0.1–0.9)
Monocytes: 7 %
Neutrophils Absolute: 5.6 10*3/uL (ref 1.4–7.0)
Neutrophils: 77 %
Platelets: 218 10*3/uL (ref 150–450)
RBC: 3.71 x10E6/uL — ABNORMAL LOW (ref 4.14–5.80)
RDW: 12.7 % (ref 11.6–15.4)
WBC: 7.3 10*3/uL (ref 3.4–10.8)

## 2019-12-29 LAB — VITAMIN B12: Vitamin B-12: 607 pg/mL (ref 232–1245)

## 2019-12-29 LAB — FERRITIN: Ferritin: 254 ng/mL (ref 30–400)

## 2020-01-13 ENCOUNTER — Other Ambulatory Visit: Payer: Self-pay | Admitting: Family Medicine

## 2020-01-13 DIAGNOSIS — E785 Hyperlipidemia, unspecified: Secondary | ICD-10-CM

## 2020-01-14 ENCOUNTER — Other Ambulatory Visit: Payer: Self-pay | Admitting: *Deleted

## 2020-01-14 MED ORDER — SUCRALFATE 1 G PO TABS: 1.0000 g | ORAL_TABLET | Freq: Four times a day (QID) | ORAL | 0 refills | Status: DC

## 2020-01-20 DIAGNOSIS — I4891 Unspecified atrial fibrillation: Secondary | ICD-10-CM | POA: Diagnosis not present

## 2020-01-20 DIAGNOSIS — I714 Abdominal aortic aneurysm, without rupture: Secondary | ICD-10-CM | POA: Diagnosis not present

## 2020-01-20 DIAGNOSIS — I252 Old myocardial infarction: Secondary | ICD-10-CM | POA: Diagnosis not present

## 2020-01-20 DIAGNOSIS — I4819 Other persistent atrial fibrillation: Secondary | ICD-10-CM | POA: Diagnosis not present

## 2020-01-20 DIAGNOSIS — Z7901 Long term (current) use of anticoagulants: Secondary | ICD-10-CM | POA: Diagnosis not present

## 2020-01-20 DIAGNOSIS — I2510 Atherosclerotic heart disease of native coronary artery without angina pectoris: Secondary | ICD-10-CM | POA: Diagnosis not present

## 2020-01-21 ENCOUNTER — Ambulatory Visit: Payer: Medicare Other | Admitting: Family Medicine

## 2020-01-29 ENCOUNTER — Ambulatory Visit: Payer: Medicare Other | Admitting: Nurse Practitioner

## 2020-01-29 ENCOUNTER — Other Ambulatory Visit (INDEPENDENT_AMBULATORY_CARE_PROVIDER_SITE_OTHER): Payer: Medicare Other

## 2020-01-29 ENCOUNTER — Encounter: Payer: Self-pay | Admitting: Nurse Practitioner

## 2020-01-29 VITALS — BP 122/78 | HR 82 | Wt 173.0 lb

## 2020-01-29 DIAGNOSIS — K922 Gastrointestinal hemorrhage, unspecified: Secondary | ICD-10-CM

## 2020-01-29 DIAGNOSIS — D509 Iron deficiency anemia, unspecified: Secondary | ICD-10-CM

## 2020-01-29 LAB — CBC
HCT: 36.8 % — ABNORMAL LOW (ref 39.0–52.0)
Hemoglobin: 12 g/dL — ABNORMAL LOW (ref 13.0–17.0)
MCHC: 32.7 g/dL (ref 30.0–36.0)
MCV: 82.6 fl (ref 78.0–100.0)
Platelets: 167 10*3/uL (ref 150.0–400.0)
RBC: 4.46 Mil/uL (ref 4.22–5.81)
RDW: 14.7 % (ref 11.5–15.5)
WBC: 5.8 10*3/uL (ref 4.0–10.5)

## 2020-01-29 NOTE — Progress Notes (Signed)
Noted  

## 2020-01-29 NOTE — Progress Notes (Signed)
01/29/2020 Theodore Mendoza [id number] 07/11/1940   Chief Complaint: anemia, hospital follow up  History of Present Illness: Theodore Mendoza is an 79 year old male with a past medical history of hypertension, hypercholesterolemia, coronary artery disease, NSTEMI s/p PTCA  distal cx 05/2006, paroxysmal atrial fibrillation 03/2019 s/p cardioversion 05/27/2019 on Eliquis, TIA vs CVA  10/2015 on Plavix, neuropathy, Guillain Barre syndrome 1987 and meningioma x 2 followed by neurosurgery.  I initially saw the patient in the office on 12/14/2019 for further evaluation anemia, passing solid black stools with a positive FOBT while on Plavix and Eliquis.  His blood pressure was low 89/60.  He was found to have a 3 to 4 cm pulsatile mass suspicious for an aortic aneurysm on exam.  He was sent to [facility] emergency room for further evaluation.  In the ED, hemoglobin level was 9.7.  Plavix and Eliquis were placed on hold.  An abdominal/pelvic CT angiogram showed a significant interval enlargement of the infrarenal abdominal aorta with multiple areas of ulcerated plaque and penetrating ulcer disease present. There is a large circumferential contained extrusion of contrast material along the anterior margin of the distal abdominal aorta consistent with a very large ulcerated plaque causing a broad penetrating ulcer.  He was evaluated by vascular surgery with recommendations to repeat a CTA in 3 months, no surgical intervention was required.  IR was consulted without recommendation of embolization of left gastric vessel which would be too difficult for IR intervention given the large aortic aneurysm.  He underwent an EGD 12/16/2019 which identified a 20 mm semisessile polyp with friable ulcerated mucosa with bleeding found in the cardia which was resected and clips were placed.  Persistent slow ooze was noted from the surrounding friable mucosa.  IR was consulted without recommendation of embolization of left  gastric vessel which would be too difficult for IR intervention given the large aortic aneurysm.  He was placed on Protonix 2 mg p.o. twice daily for 3 months then to reduce to 40 mg daily indefinitely and Carafate 1 g 4 times daily for 4 weeks.  His hemoglobin level stabilized and therefore he did not require any blood transfusions during his hospital admission.  His hemoglobin level was 10.9 at the time of discharge 12/18/2019.  Repeat H&H on 12/28/2019 showed a hemoglobin level up to 10.1. His Plavix and Eliquis were started one week post hospital discharge.   He presents to our office today accompanied by his wife for hospital follow-up.  He reported seen a black stool once or twice for the first several days following his hospital discharge.  Since that time, his stools remain brown.  He is passing 1 or 2 normal brown bowel movements daily.  No rectal bleeding or melena.  He denies having any abdominal pain.  His appetite is good. No GERD symptoms. He confirms he is  taking Protonix 40 mg p.o. twice daily and he just refilled his Carafate 1 g p.o. 4 times daily.  He remains on Ferrous Sulfate 325 mg once daily.  He is back in atrial fibrillation and his cardiologist has recommended a hospital admission for chemical cardioversion in the near future and if that fails electrical cardioversion will be attempted.  No other complaints at this time.  CBC Latest Ref Rng & Units 12/28/2019 12/18/2019 12/17/2019  WBC 3.4 - 10.8 x10E3/uL 7.3 - 4.4  Hemoglobin 13.0 - 17.7 g/dL 10.1(L) 8.9(L) 8.2(L)  Hematocrit 37.5 - 51.0 % 33.5(L) 29.2(L) 27.0(L)  Platelets  150 - 450 x10E3/uL 218 - 168    EGD 12/16/2019 by Dr. Silverio Decamp: - Z-line regular, 35 cm from the incisors. - Gastroesophageal flap valve classified as Hill Grade III (minimal fold, loose to endoscope, hiatal hernia likely). - A single 20 mm elongated semi-sessile polyp with friable ulcerated mucosa, bleeding and stigmata of recent bleeding was found in the  cardia. Resected and retrieved. Clips (MR conditional) were placed. This polyp was then removed with a hot snare. Resection and retrieval were complete. To prevent bleeding post-intervention, two hemostatic clips were successfully placed (MR conditional). Persistent slow ooze noted from surounding friable mucosa. - Normal examined duodenum. - IR was consulted without recommendation of embolization of left gastric vessel which would be too difficult for IR intervention given the large aortic aneurysm.  Current Outpatient Medications on File Prior to Visit  Medication Sig Dispense Refill   amLODipine (NORVASC) 5 MG tablet TAKE 1 TABLET BY MOUTH DAILY. 90 tablet 0   apixaban (ELIQUIS) 5 MG TABS tablet Take 1 tablet (5 mg total) by mouth 2 (two) times daily. restart 7 days after discharge 60 tablet    clopidogrel (PLAVIX) 75 MG tablet TAKE 1 TABLET ONCE DAILY 90 tablet 1   escitalopram (LEXAPRO) 10 MG tablet TAKE 1 TABLET AT BEDTIME 90 tablet 0   Ferrous Sulfate (IRON) 325 (65 Fe) MG TABS Take 1 tablet by mouth daily.      furosemide (LASIX) 20 MG tablet Take 1 tablet (20 mg total) by mouth daily. 30 tablet 1   gabapentin (NEURONTIN) 300 MG capsule Take 1 capsule (300 mg total) by mouth 2 (two) times daily AND 2 capsules (600 mg total) at bedtime. 360 capsule 1   lisinopril (ZESTRIL) 5 MG tablet TAKE 1 TABLET ONCE DAILY (Patient taking differently: Take 5 mg by mouth daily. ) 90 tablet 0   metoprolol tartrate (LOPRESSOR) 25 MG tablet Take 0.5 tablets (12.5 mg total) by mouth daily.     pantoprazole (PROTONIX) 40 MG tablet Take 1 tablet (40 mg total) by mouth 2 (two) times daily. 60 tablet 2   pravastatin (PRAVACHOL) 40 MG tablet TAKE 1 TABLET ONCE DAILY WITH LUNCH 90 tablet 0   sucralfate (CARAFATE) 1 g tablet Take 1 tablet (1 g total) by mouth 4 (four) times daily. 120 tablet 0   tamsulosin (FLOMAX) 0.4 MG CAPS capsule Take 0.4 mg by mouth daily.      vitamin B-12 (CYANOCOBALAMIN)  1000 MCG tablet Take 1 tablet (1,000 mcg total) by mouth daily. 30 tablet 2   No current facility-administered medications on file prior to visit.     Allergies  Allergen Reactions   Hydrocodone-Acetaminophen    Morphine And Related Other (See Comments)    Reaction:  Confusion/weakness/hallucinations   Valsartan Rash     Current Medications, Allergies, Past Medical History, Past Surgical History, Family History and Social History were reviewed in [employer] record.   Review of Systems:   Constitutional: Negative for fever, sweats, chills or weight loss.  Respiratory: Negative for shortness of breath.   Cardiovascular: + atrial fibrillation, chronic LE edema.  Gastrointestinal: See HPI.  Musculoskeletal: Negative for back pain or muscle aches.  Neurological: Negative for dizziness or headaches.    Physical Exam: BP 122/78    Pulse 82    Wt 173 lb (78.5 kg) Comment: per patient, he was weighed at hospital   SpO2 98%    BMI 26.30 kg/m  General: 79 year old male presents in a  wheelchair in no acute distress. Head: Normocephalic and atraumatic. Eyes: No scleral icterus. Conjunctiva pink . Lungs: Clear throughout to auscultation. Heart: Irregular rhythm, no murmurs. Abdomen: Soft, nontender and nondistended. No hepatomegaly. Normal bowel sounds x 4 quadrants. 3-4 cm abdominal aortic aneurysm above the umbilicus.  Rectal: Deferred. Musculoskeletal: Bilateral UE deformity.  Extremities: No edema. Neurological: Alert oriented x 4. No focal deficits.  Psychological: Alert and cooperative. Normal mood and affect  Assessment and Recommendations:  57. 79 year old male with acute on chronic anemia, GI bleed while on Plavix and Eliquis. S/P EGD 12/18/2019 showed a arge ulcerated bleeding gastric polyp s/p removal with hot snare and placement of hemostatic clips X2. There was some slow oozing from surrounding friable mucosa post polypectomy not amenable for IR  emobolization. Hg 8.9 -> 10.1.  -CBC, iron, iron saturation, Ferritin and TIBC -Continue Ferrous Sulfate 325 mg once daily -Continue Fantoprazole 40 mg p.o. twice daily x 2 months then reduce to 40 mg once daily indefinitely -Reduce Carafate 1 g p.o. twice daily for 2 weeks then stop -Patient to call our office if black stools recur -Follow-up in the office in 3 to 4 months -No plans for a colonoscopy   57. 79 year old male with CAD MI s/p PTCA 2008, paroxysmal atrial fibrillation, TIA on Plavix and Eliquis. Cardiology plans of near future cardioversion.  3. AAA -Follow up with vascular surgery, repeat abd/pelvic CTA  03/2020

## 2020-01-29 NOTE — Patient Instructions (Signed)
If you are age 81 or older, your body mass index should be between 23-30. Your Body mass index is 26.3 kg/m. If this is out of the aforementioned range listed, please consider follow up with your Primary Care Provider.  If you are age 51 or younger, your body mass index should be between 19-25. Your Body mass index is 26.3 kg/m. If this is out of the aformentioned range listed, please consider follow up with your Primary Care Provider.   Your provider has requested that you go to the basement level for lab work before leaving today. Press "B" on the elevator. The lab is located at the first door on the left as you exit the elevator.  Continue Pantoprazole 40 mg 1 tablet twice daily for the next 2 months, then 1 tablet daily indefinitely.  Reduce your Carafate to 1 tablet to twice daily for 2 weeks and then stop.   Call the office if you see black stools or rectal bleeding.  Call the office to schedule a follow up with Dr. Henrene Pastor in 3-4 months.

## 2020-01-30 LAB — IRON,TIBC AND FERRITIN PANEL
%SAT: 11 % (calc) — ABNORMAL LOW (ref 20–48)
Ferritin: 59 ng/mL (ref 24–380)
Iron: 32 ug/dL — ABNORMAL LOW (ref 50–180)
TIBC: 298 mcg/dL (calc) (ref 250–425)

## 2020-02-03 ENCOUNTER — Ambulatory Visit (INDEPENDENT_AMBULATORY_CARE_PROVIDER_SITE_OTHER): Payer: Medicare Other | Admitting: Family Medicine

## 2020-02-03 ENCOUNTER — Other Ambulatory Visit: Payer: Self-pay

## 2020-02-03 ENCOUNTER — Encounter: Payer: Self-pay | Admitting: Family Medicine

## 2020-02-03 VITALS — BP 118/75 | HR 80 | Temp 96.9°F | Ht 68.0 in

## 2020-02-03 DIAGNOSIS — I4891 Unspecified atrial fibrillation: Secondary | ICD-10-CM | POA: Diagnosis not present

## 2020-02-03 DIAGNOSIS — Z23 Encounter for immunization: Secondary | ICD-10-CM

## 2020-02-03 DIAGNOSIS — D649 Anemia, unspecified: Secondary | ICD-10-CM

## 2020-02-03 DIAGNOSIS — E785 Hyperlipidemia, unspecified: Secondary | ICD-10-CM | POA: Diagnosis not present

## 2020-02-03 DIAGNOSIS — I10 Essential (primary) hypertension: Secondary | ICD-10-CM

## 2020-02-03 NOTE — Patient Instructions (Signed)
DASH Eating Plan °DASH stands for "Dietary Approaches to Stop Hypertension." The DASH eating plan is a healthy eating plan that has been shown to reduce high blood pressure (hypertension). It may also reduce your risk for type 2 diabetes, heart disease, and stroke. The DASH eating plan may also help with weight loss. °What are tips for following this plan? ° °General guidelines °· Avoid eating more than 2,300 mg (milligrams) of salt (sodium) a day. If you have hypertension, you may need to reduce your sodium intake to 1,500 mg a day. °· Limit alcohol intake to no more than 1 drink a day for nonpregnant women and 2 drinks a day for men. One drink equals 12 oz of beer, 5 oz of wine, or 1½ oz of hard liquor. °· Work with your health care provider to maintain a healthy body weight or to lose weight. Ask what an ideal weight is for you. °· Get at least 30 minutes of exercise that causes your heart to beat faster (aerobic exercise) most days of the week. Activities may include walking, swimming, or biking. °· Work with your health care provider or diet and nutrition specialist (dietitian) to adjust your eating plan to your individual calorie needs. °Reading food labels ° °· Check food labels for the amount of sodium per serving. Choose foods with less than 5 percent of the Daily Value of sodium. Generally, foods with less than 300 mg of sodium per serving fit into this eating plan. °· To find whole grains, look for the word "whole" as the first word in the ingredient list. °Shopping °· Buy products labeled as "low-sodium" or "no salt added." °· Buy fresh foods. Avoid canned foods and premade or frozen meals. °Cooking °· Avoid adding salt when cooking. Use salt-free seasonings or herbs instead of table salt or sea salt. Check with your health care provider or pharmacist before using salt substitutes. °· Do not fry foods. Cook foods using healthy methods such as baking, boiling, grilling, and broiling instead. °· Cook with  heart-healthy oils, such as olive, canola, soybean, or sunflower oil. °Meal planning °· Eat a balanced diet that includes: °? 5 or more servings of fruits and vegetables each day. At each meal, try to fill half of your plate with fruits and vegetables. °? Up to 6-8 servings of whole grains each day. °? Less than 6 oz of lean meat, poultry, or fish each day. A 3-oz serving of meat is about the same size as a deck of cards. One egg equals 1 oz. °? 2 servings of low-fat dairy each day. °? A serving of nuts, seeds, or beans 5 times each week. °? Heart-healthy fats. Healthy fats called Omega-3 fatty acids are found in foods such as flaxseeds and coldwater fish, like sardines, salmon, and mackerel. °· Limit how much you eat of the following: °? Canned or prepackaged foods. °? Food that is high in trans fat, such as fried foods. °? Food that is high in saturated fat, such as fatty meat. °? Sweets, desserts, sugary drinks, and other foods with added sugar. °? Full-fat dairy products. °· Do not salt foods before eating. °· Try to eat at least 2 vegetarian meals each week. °· Eat more home-cooked food and less restaurant, buffet, and fast food. °· When eating at a restaurant, ask that your food be prepared with less salt or no salt, if possible. °What foods are recommended? °The items listed may not be a complete list. Talk with your dietitian about   what dietary choices are best for you. °Grains °Whole-grain or whole-wheat bread. Whole-grain or whole-wheat pasta. Brown rice. Oatmeal. Quinoa. Bulgur. Whole-grain and low-sodium cereals. Pita bread. Low-fat, low-sodium crackers. Whole-wheat flour tortillas. °Vegetables °Fresh or frozen vegetables (raw, steamed, roasted, or grilled). Low-sodium or reduced-sodium tomato and vegetable juice. Low-sodium or reduced-sodium tomato sauce and tomato paste. Low-sodium or reduced-sodium canned vegetables. °Fruits °All fresh, dried, or frozen fruit. Canned fruit in natural juice (without  added sugar). °Meat and other protein foods °Skinless chicken or turkey. Ground chicken or turkey. Pork with fat trimmed off. Fish and seafood. Egg whites. Dried beans, peas, or lentils. Unsalted nuts, nut butters, and seeds. Unsalted canned beans. Lean cuts of beef with fat trimmed off. Low-sodium, lean deli meat. °Dairy °Low-fat (1%) or fat-free (skim) milk. Fat-free, low-fat, or reduced-fat cheeses. Nonfat, low-sodium ricotta or cottage cheese. Low-fat or nonfat yogurt. Low-fat, low-sodium cheese. °Fats and oils °Soft margarine without trans fats. Vegetable oil. Low-fat, reduced-fat, or light mayonnaise and salad dressings (reduced-sodium). Canola, safflower, olive, soybean, and sunflower oils. Avocado. °Seasoning and other foods °Herbs. Spices. Seasoning mixes without salt. Unsalted popcorn and pretzels. Fat-free sweets. °What foods are not recommended? °The items listed may not be a complete list. Talk with your dietitian about what dietary choices are best for you. °Grains °Baked goods made with fat, such as croissants, muffins, or some breads. Dry pasta or rice meal packs. °Vegetables °Creamed or fried vegetables. Vegetables in a cheese sauce. Regular canned vegetables (not low-sodium or reduced-sodium). Regular canned tomato sauce and paste (not low-sodium or reduced-sodium). Regular tomato and vegetable juice (not low-sodium or reduced-sodium). Pickles. Olives. °Fruits °Canned fruit in a light or heavy syrup. Fried fruit. Fruit in cream or butter sauce. °Meat and other protein foods °Fatty cuts of meat. Ribs. Fried meat. Bacon. Sausage. Bologna and other processed lunch meats. Salami. Fatback. Hotdogs. Bratwurst. Salted nuts and seeds. Canned beans with added salt. Canned or smoked fish. Whole eggs or egg yolks. Chicken or turkey with skin. °Dairy °Whole or 2% milk, cream, and half-and-half. Whole or full-fat cream cheese. Whole-fat or sweetened yogurt. Full-fat cheese. Nondairy creamers. Whipped toppings.  Processed cheese and cheese spreads. °Fats and oils °Butter. Stick margarine. Lard. Shortening. Ghee. Bacon fat. Tropical oils, such as coconut, palm kernel, or palm oil. °Seasoning and other foods °Salted popcorn and pretzels. Onion salt, garlic salt, seasoned salt, table salt, and sea salt. Worcestershire sauce. Tartar sauce. Barbecue sauce. Teriyaki sauce. Soy sauce, including reduced-sodium. Steak sauce. Canned and packaged gravies. Fish sauce. Oyster sauce. Cocktail sauce. Horseradish that you find on the shelf. Ketchup. Mustard. Meat flavorings and tenderizers. Bouillon cubes. Hot sauce and Tabasco sauce. Premade or packaged marinades. Premade or packaged taco seasonings. Relishes. Regular salad dressings. °Where to find more information: °· National Heart, Lung, and Blood Institute: www.nhlbi.nih.gov °· American Heart Association: www.heart.org °Summary °· The DASH eating plan is a healthy eating plan that has been shown to reduce high blood pressure (hypertension). It may also reduce your risk for type 2 diabetes, heart disease, and stroke. °· With the DASH eating plan, you should limit salt (sodium) intake to 2,300 mg a day. If you have hypertension, you may need to reduce your sodium intake to 1,500 mg a day. °· When on the DASH eating plan, aim to eat more fresh fruits and vegetables, whole grains, lean proteins, low-fat dairy, and heart-healthy fats. °· Work with your health care provider or diet and nutrition specialist (dietitian) to adjust your eating plan to your   individual calorie needs. °This information is not intended to replace advice given to you by your health care provider. Make sure you discuss any questions you have with your health care provider. °Document Revised: 03/01/2017 Document Reviewed: 03/12/2016 °Elsevier Patient Education © 2020 [employer] °Hypertension, Adult °High blood pressure (hypertension) is when the force of blood pumping through the arteries is too strong. The  arteries are the blood vessels that carry blood from the heart throughout the body. Hypertension forces the heart to work harder to pump blood and may cause arteries to become narrow or stiff. Untreated or uncontrolled hypertension can cause a heart attack, heart failure, a stroke, kidney disease, and other problems. °A blood pressure reading consists of a higher number over a lower number. Ideally, your blood pressure should be below 120/80. The first ("top") number is called the systolic pressure. It is a measure of the pressure in your arteries as your heart beats. The second ("bottom") number is called the diastolic pressure. It is a measure of the pressure in your arteries as the heart relaxes. °What are the causes? °The exact cause of this condition is not known. There are some conditions that result in or are related to high blood pressure. °What increases the risk? °Some risk factors for high blood pressure are under your control. The following factors may make you more likely to develop this condition: °· Smoking. °· Having type 2 diabetes mellitus, high cholesterol, or both. °· Not getting enough exercise or physical activity. °· Being overweight. °· Having too much fat, sugar, calories, or salt (sodium) in your diet. °· Drinking too much alcohol. °Some risk factors for high blood pressure may be difficult or impossible to change. Some of these factors include: °· Having chronic kidney disease. °· Having a family history of high blood pressure. °· Age. Risk increases with age. °· Race. You may be at higher risk if you are African American. °· Gender. Men are at higher risk than women before age 45. After age 65, women are at higher risk than men. °· Having obstructive sleep apnea. °· Stress. °What are the signs or symptoms? °High blood pressure may not cause symptoms. Very high blood pressure (hypertensive crisis) may cause: °· Headache. °· Anxiety. °· Shortness of breath. °· Nosebleed. °· Nausea and  vomiting. °· Vision changes. °· Severe chest pain. °· Seizures. °How is this diagnosed? °This condition is diagnosed by measuring your blood pressure while you are seated, with your arm resting on a flat surface, your legs uncrossed, and your feet flat on the floor. The cuff of the blood pressure monitor will be placed directly against the skin of your upper arm at the level of your heart. It should be measured at least twice using the same arm. Certain conditions can cause a difference in blood pressure between your right and left arms. °Certain factors can cause blood pressure readings to be lower or higher than normal for a short period of time: °· When your blood pressure is higher when you are in a health care provider's office than when you are at home, this is called white coat hypertension. Most people with this condition do not need medicines. °· When your blood pressure is higher at home than when you are in a health care provider's office, this is called masked hypertension. Most people with this condition may need medicines to control blood pressure. °If you have a high blood pressure reading during one visit or you have normal blood pressure   with other risk factors, you may be asked to: °· Return on a different day to have your blood pressure checked again. °· Monitor your blood pressure at home for 1 week or longer. °If you are diagnosed with hypertension, you may have other blood or imaging tests to help your health care provider understand your overall risk for other conditions. °How is this treated? °This condition is treated by making healthy lifestyle changes, such as eating healthy foods, exercising more, and reducing your alcohol intake. Your health care provider may prescribe medicine if lifestyle changes are not enough to get your blood pressure under control, and if: °· Your systolic blood pressure is above 130. °· Your diastolic blood pressure is above 80. °Your personal target blood  pressure may vary depending on your medical conditions, your age, and other factors. °Follow these instructions at home: °Eating and drinking ° °· Eat a diet that is high in fiber and potassium, and low in sodium, added sugar, and fat. An example eating plan is called the DASH (Dietary Approaches to Stop Hypertension) diet. To eat this way: °? Eat plenty of fresh fruits and vegetables. Try to fill one half of your plate at each meal with fruits and vegetables. °? Eat whole grains, such as whole-wheat pasta, brown rice, or whole-grain bread. Fill about one fourth of your plate with whole grains. °? Eat or drink low-fat dairy products, such as skim milk or low-fat yogurt. °? Avoid fatty cuts of meat, processed or cured meats, and poultry with skin. Fill about one fourth of your plate with lean proteins, such as fish, chicken without skin, beans, eggs, or tofu. °? Avoid pre-made and processed foods. These tend to be higher in sodium, added sugar, and fat. °· Reduce your daily sodium intake. Most people with hypertension should eat less than 1,500 mg of sodium a day. °· Do not drink alcohol if: °? Your health care provider tells you not to drink. °? You are pregnant, may be pregnant, or are planning to become pregnant. °· If you drink alcohol: °? Limit how much you use to: °§ 0-1 drink a day for women. °§ 0-2 drinks a day for men. °? Be aware of how much alcohol is in your drink. In the [place] one drink equals one 12 oz bottle of beer (355 mL), one 5 oz glass of wine (148 mL), or one 1½ oz glass of hard liquor (44 mL). °Lifestyle ° °· Work with your health care provider to maintain a healthy body weight or to lose weight. Ask what an ideal weight is for you. °· Get at least 30 minutes of exercise most days of the week. Activities may include walking, swimming, or biking. °· Include exercise to strengthen your muscles (resistance exercise), such as Pilates or lifting weights, as part of your weekly exercise routine. Try  to do these types of exercises for 30 minutes at least 3 days a week. °· Do not use any products that contain nicotine or tobacco, such as cigarettes, e-cigarettes, and chewing tobacco. If you need help quitting, ask your health care provider. °· Monitor your blood pressure at home as told by your health care provider. °· Keep all follow-up visits as told by your health care provider. This is important. °Medicines °· Take over-the-counter and prescription medicines only as told by your health care provider. Follow directions carefully. Blood pressure medicines must be taken as prescribed. °· Do not skip doses of blood pressure medicine. Doing this puts you at   risk for problems and can make the medicine less effective. °· Ask your health care provider about side effects or reactions to medicines that you should watch for. °Contact a health care provider if you: °· Think you are having a reaction to a medicine you are taking. °· Have headaches that keep coming back (recurring). °· Feel dizzy. °· Have swelling in your ankles. °· Have trouble with your vision. °Get help right away if you: °· Develop a severe headache or confusion. °· Have unusual weakness or numbness. °· Feel faint. °· Have severe pain in your chest or abdomen. °· Vomit repeatedly. °· Have trouble breathing. °Summary °· Hypertension is when the force of blood pumping through your arteries is too strong. If this condition is not controlled, it may put you at risk for serious complications. °· Your personal target blood pressure may vary depending on your medical conditions, your age, and other factors. For most people, a normal blood pressure is less than 120/80. °· Hypertension is treated with lifestyle changes, medicines, or a combination of both. Lifestyle changes include losing weight, eating a healthy, low-sodium diet, exercising more, and limiting alcohol. °This information is not intended to replace advice given to you by your health care  provider. Make sure you discuss any questions you have with your health care provider. °Document Revised: 11/27/2017 Document Reviewed: 11/27/2017 °Elsevier Patient Education © 2020 Elsevier Inc. ° °

## 2020-02-03 NOTE — Progress Notes (Addendum)
Patient ID: Theodore Mendoza, male    DOB: 03-Nov-1940, 79 y.o.   MRN: [id number]  Chief Complaint:  Medical Management of Chronic Issues   HPI: Theodore Mendoza is a 79 y.o. male presenting on 02/03/2020 for Medical Management of Chronic Issues  1. Essential (primary) hypertension   2. Hyperlipidemia, unspecified hyperlipidemia type   3. Atrial fibrillation, unspecified type (Theodore Mendoza)   4. Need for vaccination against Streptococcus pneumoniae    1. HTN Complaint with meds - amlopidine 5 mg, lasix 20 mg, lisinopril 5 mg Checking BP at home ranging  Exercising Regularly - in wheelchair Watching Salt intake - Yes Pertinent ROS:  Headache - No Chest pain - No Dyspnea - No Palpitations - A. fib LE edema - baseline, has improved  2. HLD Paravachol 40 mg daily. Reports "good' diet. He is in a wheelchair, so he does not exercise.   3. Anemia Theodore Mendoza has anemia results from a recent GI bleed. He is taking vitamin b12 and an iron supplement. He saw his GI last week and had lab work drawn. He reports occasional dizziness but that this has improved significantly. Denies syncope, lightheadedness, or blood in stool.   4. A. Fib Theodore Mendoza follow cardiology for A. Fib. His last visit was 2 weeks ago. He is currently out of rhythm. His cardiologist plans to schedule a cardioversion in the hospital.   They report good compliance with medications. No medication side effects  PMH: Smoking status noted  Review of Systems Per HPI.   Objective   BP 118/75   Pulse 80   Temp (!) 96.9 F (36.1 C)   Ht 5\' 8"  (1.727 m)   SpO2 100%   BMI 26.30 kg/m   Physical Exam Vitals and nursing note reviewed.  Constitutional:      General: He is not in acute distress.    Appearance: Normal appearance. He is not ill-appearing, toxic-appearing or diaphoretic.  Neck:     Vascular: No carotid bruit.  Cardiovascular:     Rate and Rhythm: Normal rate. Rhythm irregularly irregular.     Heart sounds: Normal heart  sounds. No murmur heard.   Pulmonary:     Effort: Pulmonary effort is normal. No respiratory distress.     Breath sounds: Normal breath sounds.  Abdominal:     General: Bowel sounds are normal. There is no distension.     Palpations: Abdomen is soft.     Tenderness: There is no abdominal tenderness. There is no guarding or rebound.  Musculoskeletal:     Cervical back: Neck supple.     Right lower leg: 2+ Edema present.     Left lower leg: 2+ Edema present.  Skin:    General: Skin is dry.  Neurological:     Mental Status: He is alert and oriented to person, place, and time. Mental status is at baseline.     Gait: Gait abnormal (wheelchair).  Psychiatric:        Mood and Affect: Mood normal.        Behavior: Behavior normal.       Assessment and Plan: Theodore Mendoza was seen today for medical management of chronic issues.  Diagnoses and all orders for this visit:  Essential (primary) hypertension Well controlled on current regimen.   Hyperlipidemia, unspecified hyperlipidemia type Well controlled on current regimen.   Atrial fibrillation, unspecified type ([name]) Followed by cardiology. Denies chest pain, shortness or breath, lightheadedness, syncope. Cardiology is planning to schedule a cardioversion.  Anemia Hemoglobin was 12.0 last week, up from 10.1 the previous month. Continue vitmain B12 and iron supplement.   Need for vaccination against Streptococcus pneumoniae Immunization today in office.  -     Pneumococcal polysaccharide vaccine 23-valent greater than or equal to 2yo subcutaneous/IM   [facility] handicap placard form filled out for patient today.  Educational handout given for Hypertension, DASH diet  Follow up in 3 months with PCP for chronic illnesses.   The above assessment and management plan was discussed with the patient. The patient verbalized understanding of and has agreed to the management plan. Patient is aware to call the clinic if symptoms persist or worsen.  Patient is aware when to return to the clinic for a follow-up visit. Patient educated on when it is appropriate to go to the emergency department.   Marjorie Smolder, FNP-C [facility] [phone]

## 2020-02-05 ENCOUNTER — Other Ambulatory Visit: Payer: Self-pay

## 2020-02-05 DIAGNOSIS — K922 Gastrointestinal hemorrhage, unspecified: Secondary | ICD-10-CM

## 2020-02-05 DIAGNOSIS — D509 Iron deficiency anemia, unspecified: Secondary | ICD-10-CM

## 2020-02-10 ENCOUNTER — Other Ambulatory Visit: Payer: Self-pay | Admitting: Family Medicine

## 2020-02-11 ENCOUNTER — Other Ambulatory Visit: Payer: Self-pay

## 2020-02-11 ENCOUNTER — Ambulatory Visit (INDEPENDENT_AMBULATORY_CARE_PROVIDER_SITE_OTHER): Payer: Medicare Other | Admitting: Pharmacist

## 2020-02-11 DIAGNOSIS — I4891 Unspecified atrial fibrillation: Secondary | ICD-10-CM

## 2020-02-11 MED ORDER — APIXABAN 5 MG PO TABS
5.0000 mg | ORAL_TABLET | Freq: Two times a day (BID) | ORAL | 11 refills | Status: DC
Start: 2020-02-11 — End: 2020-02-24

## 2020-02-11 NOTE — Progress Notes (Signed)
    02/11/2020 Name: Theodore Mendoza MRN: [id number] DOB: [birth year]   S:  89 yoMPresents formedication management and medication assistance for Eliquis. Patient is currently on eliquis for atrial fibrillation. He is stable and tolerating medication. He is now in the medicare coverage gap and experiencing very high copays.  Insurance coverage/medication affordability:[employer] medicare  Patient reports adherence with medications  O:  CBC    Component Value Date/Time   WBC 5.8 01/29/2020 1356   RBC 4.46 01/29/2020 1356   HGB 12.0 (L) 01/29/2020 1356   HGB 10.1 (L) 12/28/2019 1125   HCT 36.8 (L) 01/29/2020 1356   HCT 33.5 (L) 12/28/2019 1125   PLT 167.0 01/29/2020 1356   PLT 218 12/28/2019 1125   MCV 82.6 01/29/2020 1356   MCV 90 12/28/2019 1125   MCH 27.2 12/28/2019 1125   MCH 28.2 12/17/2019 0447   MCHC 32.7 01/29/2020 1356   RDW 14.7 01/29/2020 1356   RDW 12.7 12/28/2019 1125   LYMPHSABS 0.9 12/28/2019 1125   MONOABS 0.3 12/16/2019 0429   EOSABS 0.2 12/28/2019 1125   BASOSABS 0.1 12/28/2019 1125    GFR>60 on 12/17/2019   A/P:  Patient has not been compliant with Eliquis due to cost (eliquis is >$100 per month in coverage gap). He does meet criteria for the [employer] Eliquis patient assistance program (3% OOP spend).  We will apply for medication assistance for Eliquis via [employer] (Fax# [phone]).  Eliquis therapy likely indefinite.   Samples of Eliquis 5mg  given to patient to bridge supply ([id number], EXP 8/23)  Information about your medication: Anticoagulant  Generic Name (Brand): Apixaban (Eliquis)  Apixaban is used to reduce the risk of forming blood clots that cause a stroke due to an irregular heartbeat.  Common SIDE EFFECTS you may experience include: extremity pain and increased risk of bleeding or bruising.  This drug must be taken consistently as prescribed to maintain its effect.  Multiple drug-drug interactions  exist for this medication. Consult healthcare professional prior to starting a new drug.  Tell your physicians and dentists that you are taking this drug before elective surgery or invasive procedures and before any new drug is prescribed.  Contact your health care provider if you experience: any signs of blood loss or unusual bleeding.   Patient instructions provided. Total time in face to face counseling 54minutes.    Regina Eck, PharmD, BCPS Clinical Pharmacist, [place]  II Phone [phone]

## 2020-02-23 ENCOUNTER — Other Ambulatory Visit: Payer: Self-pay

## 2020-02-23 DIAGNOSIS — I714 Abdominal aortic aneurysm, without rupture, unspecified: Secondary | ICD-10-CM

## 2020-02-24 ENCOUNTER — Other Ambulatory Visit: Payer: Self-pay | Admitting: Family Medicine

## 2020-02-24 DIAGNOSIS — I10 Essential (primary) hypertension: Secondary | ICD-10-CM

## 2020-02-24 MED ORDER — APIXABAN 5 MG PO TABS: 5.0000 mg | ORAL_TABLET | Freq: Two times a day (BID) | ORAL | 11 refills | Status: DC

## 2020-02-29 DIAGNOSIS — N202 Calculus of kidney with calculus of ureter: Secondary | ICD-10-CM | POA: Diagnosis not present

## 2020-02-29 DIAGNOSIS — N281 Cyst of kidney, acquired: Secondary | ICD-10-CM | POA: Diagnosis not present

## 2020-02-29 DIAGNOSIS — R34 Anuria and oliguria: Secondary | ICD-10-CM | POA: Diagnosis not present

## 2020-02-29 DIAGNOSIS — N480 Leukoplakia of penis: Secondary | ICD-10-CM | POA: Diagnosis not present

## 2020-03-01 DIAGNOSIS — I10 Essential (primary) hypertension: Secondary | ICD-10-CM | POA: Diagnosis not present

## 2020-03-01 DIAGNOSIS — E785 Hyperlipidemia, unspecified: Secondary | ICD-10-CM | POA: Diagnosis not present

## 2020-03-01 DIAGNOSIS — Z993 Dependence on wheelchair: Secondary | ICD-10-CM | POA: Diagnosis not present

## 2020-03-01 DIAGNOSIS — I714 Abdominal aortic aneurysm, without rupture: Secondary | ICD-10-CM | POA: Diagnosis not present

## 2020-03-01 DIAGNOSIS — K219 Gastro-esophageal reflux disease without esophagitis: Secondary | ICD-10-CM | POA: Diagnosis not present

## 2020-03-01 DIAGNOSIS — I4819 Other persistent atrial fibrillation: Secondary | ICD-10-CM | POA: Diagnosis not present

## 2020-03-01 DIAGNOSIS — I252 Old myocardial infarction: Secondary | ICD-10-CM | POA: Diagnosis not present

## 2020-03-01 DIAGNOSIS — I4891 Unspecified atrial fibrillation: Secondary | ICD-10-CM | POA: Diagnosis not present

## 2020-03-01 DIAGNOSIS — I2510 Atherosclerotic heart disease of native coronary artery without angina pectoris: Secondary | ICD-10-CM | POA: Diagnosis not present

## 2020-03-01 DIAGNOSIS — Z5181 Encounter for therapeutic drug level monitoring: Secondary | ICD-10-CM | POA: Diagnosis not present

## 2020-03-01 DIAGNOSIS — Z7901 Long term (current) use of anticoagulants: Secondary | ICD-10-CM | POA: Diagnosis not present

## 2020-03-01 DIAGNOSIS — G609 Hereditary and idiopathic neuropathy, unspecified: Secondary | ICD-10-CM | POA: Diagnosis not present

## 2020-03-01 DIAGNOSIS — Z79899 Other long term (current) drug therapy: Secondary | ICD-10-CM | POA: Diagnosis not present

## 2020-03-01 DIAGNOSIS — Z86011 Personal history of benign neoplasm of the brain: Secondary | ICD-10-CM | POA: Diagnosis not present

## 2020-03-01 DIAGNOSIS — G650 Sequelae of Guillain-Barre syndrome: Secondary | ICD-10-CM | POA: Diagnosis not present

## 2020-03-01 DIAGNOSIS — E7800 Pure hypercholesterolemia, unspecified: Secondary | ICD-10-CM | POA: Diagnosis not present

## 2020-03-01 DIAGNOSIS — F325 Major depressive disorder, single episode, in full remission: Secondary | ICD-10-CM | POA: Diagnosis not present

## 2020-03-01 DIAGNOSIS — N400 Enlarged prostate without lower urinary tract symptoms: Secondary | ICD-10-CM | POA: Diagnosis not present

## 2020-03-01 DIAGNOSIS — Z9861 Coronary angioplasty status: Secondary | ICD-10-CM | POA: Diagnosis not present

## 2020-03-03 ENCOUNTER — Telehealth: Payer: Self-pay

## 2020-03-03 NOTE — Telephone Encounter (Signed)
Contact Date: 03/03/2020 Contacted By: Felicity Coyer, LPN  Transition Care Management Follow-up Telephone Call  Date of discharge and from where: 03/03/2020, [facility]  Discharge Diagnosis:  Unspecified atrial fibrillation  How have you been since you were released from the hospital? Doing okay, just came home today  Any questions or concerns? No   Items Reviewed:  Did the pt receive and understand the discharge instructions provided? Yes   Medications obtained and verified? Yes   Any new allergies since your discharge? No   Dietary orders reviewed? Yes  Do you have support at home? Yes   Discontinued Medications None  New Medications Added Dofetilide 500 mg twice a day  Current Medication List Allergies as of 03/03/2020      Reactions   Hydrocodone-acetaminophen    Morphine And Related Other (See Comments)   Reaction:  Confusion/weakness/hallucinations   Valsartan Rash      Medication List       Accurate as of March 03, 2020  4:12 PM. If you have any questions, ask your nurse or doctor.        amLODipine 5 MG tablet Commonly known as: NORVASC TAKE 1 TABLET BY MOUTH DAILY.   apixaban 5 MG Tabs tablet Commonly known as: Eliquis Take 1 tablet (5 mg total) by mouth 2 (two) times daily. restart 7 days after discharge   clopidogrel 75 MG tablet Commonly known as: PLAVIX TAKE 1 TABLET ONCE DAILY   escitalopram 10 MG tablet Commonly known as: LEXAPRO TAKE 1 TABLET AT BEDTIME   furosemide 20 MG tablet Commonly known as: Lasix Take 1 tablet (20 mg total) by mouth daily.   gabapentin 300 MG capsule Commonly known as: NEURONTIN Take 1 capsule (300 mg total) by mouth 2 (two) times daily AND 2 capsules (600 mg total) at bedtime.   Iron 325 (65 Fe) MG Tabs Take 1 tablet by mouth daily.   lisinopril 5 MG tablet Commonly known as: ZESTRIL TAKE 1 TABLET ONCE DAILY   metoprolol tartrate 25 MG tablet Commonly known as: LOPRESSOR Take 0.5  tablets (12.5 mg total) by mouth daily.   pantoprazole 40 MG tablet Commonly known as: Protonix Take 1 tablet (40 mg total) by mouth 2 (two) times daily.   pravastatin 40 MG tablet Commonly known as: PRAVACHOL TAKE 1 TABLET ONCE DAILY WITH LUNCH   sucralfate 1 g tablet Commonly known as: CARAFATE TAKE  (1)  TABLET  FOUR TIMES DAILY.   tamsulosin 0.4 MG Caps capsule Commonly known as: FLOMAX Take 0.4 mg by mouth daily.   vitamin B-12 1000 MCG tablet Commonly known as: CYANOCOBALAMIN Take 1 tablet (1,000 mcg total) by mouth daily.        Home Care and Equipment/Supplies: Were home health services ordered? no If so, what is the name of the agency? Not applicable  Has the agency set up a time to come to the patient's home? not applicable Were any new equipment or medical supplies ordered?  No What is the name of the medical supply agency? Not applicable Were you able to get the supplies/equipment? not applicable Do you have any questions related to the use of the equipment or supplies?  Not applicable  Functional Questionnaire: (I = Independent and D = Dependent) ADLs: Independent  Bathing/Dressing-  Independent  Meal Prep- Independent  Eating- Independent  Maintaining continence- Independent  Transferring/Ambulation- Independent  Managing Meds- Independent  Follow up appointments reviewed:   PCP Hospital f/u appt confirmed? Yes  Scheduled to  see 03/11/2020 with [facility] f/u appt confirmed? No    Are transportation arrangements needed? No   If their condition worsens, is the pt aware to call PCP or go to the Emergency Dept.? Yes  Was the patient provided with contact information for the PCP's office or ED? Yes  Was to pt encouraged to call back with questions or concerns? Yes

## 2020-03-04 ENCOUNTER — Other Ambulatory Visit: Payer: Self-pay | Admitting: *Deleted

## 2020-03-04 DIAGNOSIS — R601 Generalized edema: Secondary | ICD-10-CM

## 2020-03-04 MED ORDER — FUROSEMIDE 20 MG PO TABS: 20.0000 mg | ORAL_TABLET | Freq: Every day | ORAL | 0 refills | Status: DC

## 2020-03-10 ENCOUNTER — Other Ambulatory Visit: Payer: Self-pay | Admitting: *Deleted

## 2020-03-10 DIAGNOSIS — I714 Abdominal aortic aneurysm, without rupture, unspecified: Secondary | ICD-10-CM

## 2020-03-11 ENCOUNTER — Other Ambulatory Visit: Payer: Self-pay

## 2020-03-11 ENCOUNTER — Ambulatory Visit (INDEPENDENT_AMBULATORY_CARE_PROVIDER_SITE_OTHER): Payer: Medicare Other | Admitting: Nurse Practitioner

## 2020-03-11 ENCOUNTER — Encounter: Payer: Self-pay | Admitting: Nurse Practitioner

## 2020-03-11 VITALS — BP 124/57 | HR 52 | Temp 97.7°F | Ht 68.0 in

## 2020-03-11 DIAGNOSIS — Z09 Encounter for follow-up examination after completed treatment for conditions other than malignant neoplasm: Secondary | ICD-10-CM | POA: Diagnosis not present

## 2020-03-11 DIAGNOSIS — I4891 Unspecified atrial fibrillation: Secondary | ICD-10-CM

## 2020-03-11 NOTE — Assessment & Plan Note (Signed)
Patient recently admitted to [facility] health for unspecified arterial fibrillation.  Patient was started on dofetilide 500 mg tablet by mouth twice daily.  Patient is reporting compliance with medication.  No signs or symptoms of adverse reaction from new medication.  Advised patient to follow-up with worsening or unresolved symptoms.  Education provided with printed handouts given.

## 2020-03-11 NOTE — Progress Notes (Addendum)
Established Patient Office Visit  Subjective:  Patient ID: Theodore Mendoza, male    DOB: 1940/08/25  Age: 79 y.o. MRN: [id number]  CC:  Chief Complaint  Patient presents with  . Hospitalization Follow-up    HPI Theodore Mendoza presents for Today's visit was for Transitional Care Management.  The patient was discharged from [facility] on 03/03/2020 with a primary diagnosis of unspecified atrial fibrillation  Contact with the patient and/or caregiver, by a clinical staff member, was made on 03/03/20 and was documented as a telephone encounter within the EMR.  Through chart review and discussion with the patient I have determined that management of their condition is of low complexity.   Patient is an 79 year old male who presents to clinic today for hospital discharge follow-up from [facility].  He has a history of hypertension, hyperlipidemia, meningioma, CAD, PAF, and s/p DCCV [05/27/2019].  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  Patient was admitted to the cardiology department for continues telemetry monitoring and dofetilide load.  Patient's baseline ECG showed atrial fibrillation with controlled rate, QTC acceptable at 424 MS.  Patient received 5 doses of dofetilide.  Patient was later discharged after being stable to continue dofetilide 500 mg tablet by mouth twice daily. Patient was diagnosed with unspecified arterial fibrillation on discharge.  Today patient is reporting ongoing weakness and has not fully regained strength.  Patient denies fever, headache, nausea or vomiting.  Completed hospital discharge instructions which instructs patient to go to the ED with worsening symptoms.  Completed medication reconciliation.  No labs required on this visit.  Patient has future appointments discussed and confirmed understanding with patient.   Past Medical History:  Diagnosis Date  . Brain tumor ([place])    x2   . Cardiac arrhythmia   . CHF (congestive heart failure) ([place])   . Guillain-Barre syndrome ([place]) Feb 13 1986  . Hypertension   . Kidney stones   . Myocardial infarction [facility]) 2007    Past Surgical History:  Procedure Laterality Date  . CORONARY ANGIOPLASTY  1997   after mi  . CYSTOSCOPY WITH RETROGRADE PYELOGRAM, URETEROSCOPY AND STENT PLACEMENT Left 06/28/2014   Procedure: 1ST STAGE CYSTOSCOPY/URETEROSCOPY/STENT PLACEMENT;  Surgeon: Alexis Frock, MD;  Location: [facility];  Service: Urology;  Laterality: Left;  . CYSTOSCOPY WITH STENT PLACEMENT Left 05/08/2014   Procedure: CYSTOSCOPY, RETROGRADE PYELOGRAM WITH LEFT URETERAL STENT PLACEMENT;  Surgeon: Alexis Frock, MD;  Location: [facility];  Service: Urology;  Laterality: Left;  . ESOPHAGOGASTRODUODENOSCOPY (EGD) WITH PROPOFOL N/A 12/16/2019   Procedure: ESOPHAGOGASTRODUODENOSCOPY (EGD) WITH PROPOFOL;  Surgeon: Mauri Pole, MD;  Location: [facility];  Service: Endoscopy;  Laterality: N/A;  . EYE SURGERY Right may 2015   growth removed, july 2015left eye cataract removed, right eye catarct removed  . gamma kniferadiation treatment  Feb 09 2014   [facility]  . HEMOSTASIS CLIP PLACEMENT  12/16/2019   Procedure: HEMOSTASIS CLIP PLACEMENT;  Surgeon: Mauri Pole, MD;  [facility];  Service: Endoscopy;;  . HEMOSTASIS CONTROL  12/16/2019   Procedure: HEMOSTASIS CONTROL;  Surgeon: Mauri Pole, MD;  [facility];  Service: Endoscopy;;  Endoloop  . HOLMIUM LASER APPLICATION Left 7/82/9562   Procedure: HOLMIUM LASER APPLICATION;  Surgeon: Alexis Frock, MD;  Location: [facility];  Service: Urology;  Laterality: Left;  . LITHOTRIPSY  years ago  . POLYPECTOMY  12/16/2019   Procedure: POLYPECTOMY;  Surgeon:  Mauri Pole, MD;  [facility];  Service: Endoscopy;;  . STONE EXTRACTION WITH BASKET Left 06/28/2014   Procedure: STONE EXTRACTION WITH BASKET;  Surgeon: Alexis Frock, MD;   Location: [facility];  Service: Urology;  Laterality: Left;    Family History  Problem Relation Age of Onset  . Diabetes Mother   . Lung cancer Mother   . CAD Father   . Diabetes Brother   . Diabetes Sister   . Stroke Sister   . Other Maternal Grandfather        brain tumor  . Colon cancer Neg Hx   . Pancreatic cancer Neg Hx   . Esophageal cancer Neg Hx     Social History   Socioeconomic History  . Marital status: Married    Spouse name: Not on file  . Number of children: Not on file  . Years of education: Not on file  . Highest education level: Not on file  Occupational History  . Not on file  Tobacco Use  . Smoking status: Never Smoker  . Smokeless tobacco: Never Used  Vaping Use  . Vaping Use: Never used  Substance and Sexual Activity  . Alcohol use: No  . Drug use: No  . Sexual activity: Not Currently  Other Topics Concern  . Not on file  Social History Narrative   Lives with wi   Social Determinants of Health   Financial Resource Strain: Not on file  Food Insecurity: Not on file  Transportation Needs: Not on file  Physical Activity: Not on file  Stress: Not on file  Social Connections: Not on file  Intimate Partner Violence: Not on file    Outpatient Medications Prior to Visit  Medication Sig Dispense Refill  . amLODipine (NORVASC) 5 MG tablet TAKE 1 TABLET BY MOUTH DAILY. 90 tablet 0  . apixaban (ELIQUIS) 5 MG TABS tablet Take 1 tablet (5 mg total) by mouth 2 (two) times daily. restart 7 days after discharge 60 tablet 11  . clopidogrel (PLAVIX) 75 MG tablet TAKE 1 TABLET ONCE DAILY 90 tablet 1  . dofetilide (TIKOSYN) 500 MCG capsule Take 500 mcg by mouth 2 (two) times daily.    [name] escitalopram (LEXAPRO) 10 MG tablet TAKE 1 TABLET AT BEDTIME 90 tablet 0  . Ferrous Sulfate (IRON) 325 (65 Fe) MG TABS Take 1 tablet by mouth daily.     . furosemide (LASIX) 20 MG tablet Take 1 tablet (20 mg total) by mouth daily. 30 tablet 0  . gabapentin (NEURONTIN) 300 MG  capsule Take 1 capsule (300 mg total) by mouth 2 (two) times daily AND 2 capsules (600 mg total) at bedtime. 360 capsule 1  . lisinopril (ZESTRIL) 5 MG tablet TAKE 1 TABLET ONCE DAILY 90 tablet 0  . metoprolol tartrate (LOPRESSOR) 25 MG tablet Take 0.5 tablets (12.5 mg total) by mouth daily.    . pantoprazole (PROTONIX) 40 MG tablet Take 1 tablet (40 mg total) by mouth 2 (two) times daily. 60 tablet 2  . pravastatin (PRAVACHOL) 40 MG tablet TAKE 1 TABLET ONCE DAILY WITH LUNCH 90 tablet 0  . sucralfate (CARAFATE) 1 g tablet TAKE  (1)  TABLET  FOUR TIMES DAILY. 120 tablet 2  . tamsulosin (FLOMAX) 0.4 MG CAPS capsule Take 0.4 mg by mouth daily.     . vitamin B-12 (CYANOCOBALAMIN) 1000 MCG tablet Take 1 tablet (1,000 mcg total) by mouth daily. 30 tablet 2   No facility-administered medications prior to visit.  Allergies  Allergen Reactions  . Hydrocodone-Acetaminophen   . Morphine And Related Other (See Comments)    Reaction:  Confusion/weakness/hallucinations  . Valsartan Rash    ROS Review of Systems  Respiratory: Negative for cough, chest tightness, shortness of breath and wheezing.   Cardiovascular: Positive for leg swelling. Negative for palpitations.       Unspecified A. fib  All other systems reviewed and are negative.     Objective:    Physical Exam Vitals reviewed.  Constitutional:      General: He is not in acute distress. HENT:     Head: Normocephalic.  Eyes:     Conjunctiva/sclera: Conjunctivae normal.  Cardiovascular:     Rate and Rhythm: Bradycardia present.     Heart sounds: Normal heart sounds.     Comments: Unspecified A. fib Pulmonary:     Effort: Pulmonary effort is normal.     Breath sounds: Normal breath sounds.  Abdominal:     General: Bowel sounds are normal.  Skin:    General: Skin is dry.  Neurological:     Mental Status: He is alert and oriented to person, place, and time.  Psychiatric:        Behavior: Behavior normal.     BP (!)  124/57   Pulse (!) 52   Temp 97.7 F (36.5 C)   Ht 5\' 8"  (1.727 m)   SpO2 99%   BMI 26.30 kg/m  Wt Readings from Last 3 Encounters:  01/29/20 173 lb (78.5 kg)  12/18/19 163 lb 3.2 oz (74 kg)  12/14/19 173 lb (78.5 kg)     Health Maintenance Due  Topic Date Due  . TETANUS/TDAP  Never done    There are no preventive care reminders to display for this patient.  Lab Results  Component Value Date   TSH 3.241 12/14/2019   Lab Results  Component Value Date   WBC 5.8 01/29/2020   HGB 12.0 (L) 01/29/2020   HCT 36.8 (L) 01/29/2020   MCV 82.6 01/29/2020   PLT 167.0 01/29/2020   Lab Results  Component Value Date   NA 140 12/17/2019   K 3.6 12/17/2019   CO2 24 12/17/2019   GLUCOSE 72 12/17/2019   BUN 18 12/17/2019   CREATININE 0.64 12/17/2019   BILITOT 0.6 12/14/2019   ALKPHOS 57 12/14/2019   AST 13 (L) 12/14/2019   ALT 10 12/14/2019   PROT 5.7 (L) 12/14/2019   ALBUMIN 3.4 (L) 12/14/2019   CALCIUM 8.3 (L) 12/17/2019   ANIONGAP 7 12/17/2019   Lab Results  Component Value Date   CHOL 104 10/21/2019   Lab Results  Component Value Date   HDL 38 (L) 10/21/2019   Lab Results  Component Value Date   LDLCALC 54 10/21/2019   Lab Results  Component Value Date   TRIG 47 10/21/2019   Lab Results  Component Value Date   CHOLHDL 2.7 10/21/2019   Lab Results  Component Value Date   HGBA1C 5.3 10/02/2015      Assessment & Plan:   Problem List Items Addressed This Visit      Cardiovascular and Mediastinum   Atrial fibrillation [facility])    Patient recently admitted to [facility] for unspecified arterial fibrillation.  Patient was started on dofetilide 500 mg tablet by mouth twice daily.  Patient is reporting compliance with medication.  No signs or symptoms of adverse reaction from new medication.  Advised patient to follow-up with worsening or unresolved symptoms.  Education  provided with printed handouts given.      Relevant Medications    dofetilide (TIKOSYN) 500 MCG capsule     Other   Hospital discharge follow-up - Primary    Patient is in today for hospital discharge follow-up from [facility].  With unspecified atrial fibrillation.  Patient is is still weak and has not fully regained strength.  Patient denies fever, headache, nausea or vomiting.  Completed hospital discharge instructions.  With medication reconciliation.  Patient verbalized understanding.  Follow-up with worsening or unresolved symptoms.           No orders of the defined types were placed in this encounter.   Follow-up: Return in about 3 months (around 06/09/2020).    Ivy Lynn, NP

## 2020-03-11 NOTE — Progress Notes (Signed)
Corrections made to notes,let me know if I need to fix anything else thank you

## 2020-03-11 NOTE — Assessment & Plan Note (Addendum)
Patient is in today for hospital discharge follow-up from [place].  With unspecified atrial fibrillation.  Patient is is still weak and has not fully regained strength.  Patient denies fever, headache, nausea or vomiting.  Completed hospital discharge instructions.  With medication reconciliation.  Patient verbalized understanding.  Follow-up with worsening or unresolved symptoms.

## 2020-03-11 NOTE — Patient Instructions (Signed)

## 2020-03-14 ENCOUNTER — Other Ambulatory Visit: Payer: Self-pay | Admitting: *Deleted

## 2020-03-14 MED ORDER — PANTOPRAZOLE SODIUM 40 MG PO TBEC: 40.0000 mg | DELAYED_RELEASE_TABLET | Freq: Two times a day (BID) | ORAL | 2 refills | Status: DC

## 2020-03-17 ENCOUNTER — Other Ambulatory Visit (INDEPENDENT_AMBULATORY_CARE_PROVIDER_SITE_OTHER): Payer: Medicare Other

## 2020-03-17 ENCOUNTER — Ambulatory Visit
Admission: RE | Admit: 2020-03-17 | Discharge: 2020-03-17 | Disposition: A | Discharge status: Home / Self Care | Payer: Medicare Other | Source: Ambulatory Visit | Attending: Surgery | Admitting: Surgery

## 2020-03-17 DIAGNOSIS — K922 Gastrointestinal hemorrhage, unspecified: Secondary | ICD-10-CM | POA: Diagnosis not present

## 2020-03-17 DIAGNOSIS — D509 Iron deficiency anemia, unspecified: Secondary | ICD-10-CM | POA: Diagnosis not present

## 2020-03-17 DIAGNOSIS — I722 Aneurysm of renal artery: Secondary | ICD-10-CM | POA: Diagnosis not present

## 2020-03-17 DIAGNOSIS — I7102 Dissection of abdominal aorta: Secondary | ICD-10-CM | POA: Diagnosis not present

## 2020-03-17 DIAGNOSIS — I714 Abdominal aortic aneurysm, without rupture, unspecified: Secondary | ICD-10-CM

## 2020-03-17 DIAGNOSIS — I2510 Atherosclerotic heart disease of native coronary artery without angina pectoris: Secondary | ICD-10-CM | POA: Diagnosis not present

## 2020-03-17 DIAGNOSIS — I774 Celiac artery compression syndrome: Secondary | ICD-10-CM | POA: Diagnosis not present

## 2020-03-17 DIAGNOSIS — I701 Atherosclerosis of renal artery: Secondary | ICD-10-CM | POA: Diagnosis not present

## 2020-03-17 LAB — CBC
HCT: 37.5 % — ABNORMAL LOW (ref 39.0–52.0)
Hemoglobin: 12.2 g/dL — ABNORMAL LOW (ref 13.0–17.0)
MCHC: 32.5 g/dL (ref 30.0–36.0)
MCV: 80.6 fl (ref 78.0–100.0)
Platelets: 154 10*3/uL (ref 150.0–400.0)
RBC: 4.65 Mil/uL (ref 4.22–5.81)
RDW: 15.7 % — ABNORMAL HIGH (ref 11.5–15.5)
WBC: 5.8 10*3/uL (ref 4.0–10.5)

## 2020-03-17 MED ORDER — IOPAMIDOL (ISOVUE-370) INJECTION 76%
75.0000 mL | Freq: Once | INTRAVENOUS | Status: AC | PRN
  Administered 2020-03-17: 14:00:00 75 mL via INTRAVENOUS

## 2020-03-18 LAB — IRON,TIBC AND FERRITIN PANEL
%SAT: 22 % (calc) (ref 20–48)
Ferritin: 49 ng/mL (ref 24–380)
Iron: 66 ug/dL (ref 50–180)
TIBC: 295 mcg/dL (calc) (ref 250–425)

## 2020-03-21 ENCOUNTER — Other Ambulatory Visit: Payer: Self-pay | Admitting: Family Medicine

## 2020-03-21 ENCOUNTER — Ambulatory Visit (HOSPITAL_COMMUNITY)
Admission: RE | Admit: 2020-03-21 | Discharge: 2020-03-21 | Disposition: A | Discharge status: Home / Self Care | Payer: Medicare Other | Source: Ambulatory Visit | Attending: Surgery | Admitting: Surgery

## 2020-03-21 ENCOUNTER — Other Ambulatory Visit: Payer: Self-pay

## 2020-03-21 ENCOUNTER — Ambulatory Visit: Payer: Medicare Other | Admitting: Surgery

## 2020-03-21 ENCOUNTER — Encounter: Payer: Self-pay | Admitting: Surgery

## 2020-03-21 VITALS — BP 130/64 | HR 62 | Temp 97.5°F | Resp 20 | Ht 68.0 in | Wt 173.0 lb

## 2020-03-21 DIAGNOSIS — I714 Abdominal aortic aneurysm, without rupture, unspecified: Secondary | ICD-10-CM

## 2020-03-21 DIAGNOSIS — F419 Anxiety disorder, unspecified: Secondary | ICD-10-CM

## 2020-03-21 DIAGNOSIS — I10 Essential (primary) hypertension: Secondary | ICD-10-CM

## 2020-03-21 DIAGNOSIS — G610 Guillain-Barre syndrome: Secondary | ICD-10-CM

## 2020-03-21 MED ORDER — TAMSULOSIN HCL 0.4 MG PO CAPS: 0.4000 mg | ORAL_CAPSULE | Freq: Every day | ORAL | 0 refills | Status: DC

## 2020-03-21 NOTE — Progress Notes (Signed)
[facility]  Patient name: Theodore Mendoza MRN: [id number] DOB: Jun 16, 1940 Sex: male   REQUESTING PROVIDER:    ER   REASON FOR CONSULT:    AAA  HISTORY OF PRESENT ILLNESS:   Theodore Mendoza is a 79 y.o. male, who is referred for evaluation of an abdominal aortic aneurysm.  This was detected on a CT scan that was performed in September 2021 for a GI bleed.  At that time maximum diameter was 4.5 cm.  He denies abdominal pain or back pain.  Patient has a history of coronary artery disease status post PCI and 2008 for a NSTEMI.  He is on anticoagulation for atrial fibrillation.  He has undergone cardioversion.  He also has a history of TIA in 2017.  He has been diagnosed with Guillain-Barr syndrome in 1987.  He has a meningioma that is being followed by neurosurgery.  PAST MEDICAL HISTORY    Past Medical History:  Diagnosis Date  . Brain tumor ([place])    x2  . Cardiac arrhythmia   . CHF (congestive heart failure) ([place])   . Guillain-Barre syndrome ([place]) Feb 13 1986  . Hypertension   . Kidney stones   . Myocardial infarction [facility]) 2007     FAMILY HISTORY   Family History  Problem Relation Age of Onset  . Diabetes Mother   . Lung cancer Mother   . CAD Father   . Diabetes Brother   . Diabetes Sister   . Stroke Sister   . Other Maternal Grandfather        brain tumor  . Colon cancer Neg Hx   . Pancreatic cancer Neg Hx   . Esophageal cancer Neg Hx     SOCIAL HISTORY:   Social History   Socioeconomic History  . Marital status: Married    Spouse name: Not on file  . Number of children: Not on file  . Years of education: Not on file  . Highest education level: Not on file  Occupational History  . Not on file  Tobacco Use  . Smoking status: Never Smoker  . Smokeless tobacco: Never Used  Vaping Use  . Vaping Use: Never used  Substance and Sexual Activity  . Alcohol use: No  . Drug use: No  . Sexual  activity: Not Currently  Other Topics Concern  . Not on file  Social History Narrative   Lives with wi   Social Determinants of Health   Financial Resource Strain: Not on file  Food Insecurity: Not on file  Transportation Needs: Not on file  Physical Activity: Not on file  Stress: Not on file  Social Connections: Not on file  Intimate Partner Violence: Not on file    ALLERGIES:    Allergies  Allergen Reactions  . Hydrocodone-Acetaminophen   . Morphine And Related Other (See Comments)    Reaction:  Confusion/weakness/hallucinations  . Valsartan Rash    CURRENT MEDICATIONS:    Current Outpatient Medications  Medication Sig Dispense Refill  . amLODipine (NORVASC) 5 MG tablet TAKE 1 TABLET BY MOUTH DAILY. 90 tablet 0  . apixaban (ELIQUIS) 5 MG TABS tablet Take 1 tablet (5 mg total) by mouth 2 (two) times daily. restart 7 days after discharge 60 tablet 11  . clopidogrel (PLAVIX) 75 MG tablet TAKE 1 TABLET ONCE DAILY 90 tablet 1  . dofetilide (TIKOSYN) 500 MCG capsule Take 500 mcg by mouth 2 (two) times daily.    [name] escitalopram (LEXAPRO) 10  MG tablet TAKE 1 TABLET AT BEDTIME 90 tablet 0  . Ferrous Sulfate (IRON) 325 (65 Fe) MG TABS Take 1 tablet by mouth daily.     . furosemide (LASIX) 20 MG tablet Take 1 tablet (20 mg total) by mouth daily. 30 tablet 0  . gabapentin (NEURONTIN) 300 MG capsule Take 1 capsule (300 mg total) by mouth 2 (two) times daily AND 2 capsules (600 mg total) at bedtime. 360 capsule 1  . lisinopril (ZESTRIL) 5 MG tablet TAKE 1 TABLET ONCE DAILY 90 tablet 0  . metoprolol tartrate (LOPRESSOR) 25 MG tablet Take 0.5 tablets (12.5 mg total) by mouth daily.    . pantoprazole (PROTONIX) 40 MG tablet Take 1 tablet (40 mg total) by mouth 2 (two) times daily. 60 tablet 2  . pravastatin (PRAVACHOL) 40 MG tablet TAKE 1 TABLET ONCE DAILY WITH LUNCH 90 tablet 0  . sucralfate (CARAFATE) 1 g tablet TAKE  (1)  TABLET  FOUR TIMES DAILY. 120 tablet 2  . tamsulosin (FLOMAX)  0.4 MG CAPS capsule Take 0.4 mg by mouth daily.     . vitamin B-12 (CYANOCOBALAMIN) 1000 MCG tablet Take 1 tablet (1,000 mcg total) by mouth daily. 30 tablet 2   No current facility-administered medications for this visit.    REVIEW OF SYSTEMS:   [X]  denotes positive finding, [ ]  denotes negative finding Cardiac  Comments:  Chest pain or chest pressure:    Shortness of breath upon exertion: x   Short of breath when lying flat:    Irregular heart rhythm: x       Vascular    Pain in calf, thigh, or hip brought on by ambulation:    Pain in feet at night that wakes you up from your sleep:  x   Blood clot in your veins:    Leg swelling:  x       Pulmonary    Oxygen at home:    Productive cough:     Wheezing:         Neurologic    Sudden weakness in arms or legs:     Sudden numbness in arms or legs:     Sudden onset of difficulty speaking or slurred speech:    Temporary loss of vision in one eye:     Problems with dizziness:  x       Gastrointestinal    Blood in stool:  x    Vomited blood:         Genitourinary    Burning when urinating:     Blood in urine:        Psychiatric    Major depression:         Hematologic    Bleeding problems:    Problems with blood clotting too easily:        Skin    Rashes or ulcers:        Constitutional    Fever or chills:     PHYSICAL EXAM:   Vitals:   03/21/20 0854  BP: 130/64  Pulse: 62  Resp: 20  Temp: (!) 97.5 F (36.4 C)  SpO2: 96%  Weight: 173 lb (78.5 kg)  Height: 5\' 8"  (1.727 m)    GENERAL: The patient is a well-nourished male, in no acute distress. The vital signs are documented above. CARDIAC: There is a regular rate and rhythm.  VASCULAR: 2+ bilateral pitting edema.  Pedal pulses are not palpable PULMONARY: Nonlabored respirations ABDOMEN: Soft and non-tender with normal pitched  bowel sounds.  MUSCULOSKELETAL: There are no major deformities or cyanosis. NEUROLOGIC: Wheelchair-bound SKIN: There are no  ulcers or rashes noted. PSYCHIATRIC: The patient has a normal affect.  STUDIES:   I have reviewed the CT scan with the following findings: Stable mild dilation of the thoracic aorta measuring 4.1 cm in maximal dimension within its ascending segment.  Stable bilobed pseudoaneurysm/aneurysmal dissection of the infrarenal abdominal aorta secondary to multiple penetrating atherosclerotic ulcers. Both segments of aneurysmal dilation are seen within the infrarenal segment. Superiorly, this measures 4.0 cm in greatest dimension. Inferiorly, this measures 4.7 x 4.1 cm in greatest dimension. As noted previously, vascular consultation is advised.  Moderate multi-vessel coronary artery calcification.  Peripheral vascular disease with 60-70% stenosis of the celiac axis proximally secondary to extrinsic compression from the median arcuate ligament.  Hemodynamically significant stenosis of the left renal artery proximally with extensive cortical scarring of the left kidney noted. This appears unchanged. ASSESSMENT and PLAN   AAA: Maximum aortic diameter is 4.7 cm.  I discussed that given his multiple comorbidities I would not consider surgery until he is at least 5 cm.  We discussed the possibility of rupture and what to do should that occur.  I have him scheduled to come back in 6 months.  At that time I will get preoperative carotid and lower extremity vascular studies as well as an abdominal ultrasound.  He will likely need to be premedicated for his next CT scan as he had challenges afterwards for about 2 days.   Leia Alf, MD, FACS [facility] [phone] Pager [phone]

## 2020-03-21 NOTE — Addendum Note (Signed)
Addended by: Antonietta Barcelona D on: 03/21/2020 03:41 PM   Modules accepted: Orders

## 2020-03-22 ENCOUNTER — Other Ambulatory Visit: Payer: Self-pay

## 2020-03-22 DIAGNOSIS — I714 Abdominal aortic aneurysm, without rupture, unspecified: Secondary | ICD-10-CM

## 2020-03-31 ENCOUNTER — Telehealth: Payer: Self-pay

## 2020-04-01 ENCOUNTER — Other Ambulatory Visit: Payer: Self-pay | Admitting: Family Medicine

## 2020-04-01 DIAGNOSIS — E785 Hyperlipidemia, unspecified: Secondary | ICD-10-CM

## 2020-04-01 DIAGNOSIS — R601 Generalized edema: Secondary | ICD-10-CM

## 2020-04-03 MED ORDER — DOFETILIDE 500 MCG PO CAPS: 500.0000 ug | ORAL_CAPSULE | Freq: Two times a day (BID) | ORAL | 2 refills | Status: DC

## 2020-04-03 NOTE — Telephone Encounter (Signed)
I sent refill for this but he needs to follow-up with the cardiologist and his PCP to make sure that he still needs this and that they still want him on this and may need to change some of his other medicines.  Please discuss with cardiologist and PCP.  Make sure he has an appointment on the books

## 2020-04-04 NOTE — Telephone Encounter (Signed)
Please advise, patient was seen by you for transition of care appointment.  Please advise.

## 2020-04-04 NOTE — Telephone Encounter (Signed)
Patient aware that refill was sent.  He has already had TOC appointment here.  He will contact his cardiology appointment to ask about continuing this medication and also to schedule a follow up appointment.

## 2020-04-04 NOTE — Telephone Encounter (Signed)
Dr. Louanne Skye sent medication refills already 04/03/2020 and advised patient to follow-up with cardiologist and PCP.

## 2020-04-20 ENCOUNTER — Other Ambulatory Visit: Payer: Self-pay | Admitting: Family Medicine

## 2020-04-26 ENCOUNTER — Other Ambulatory Visit: Payer: Self-pay | Admitting: *Deleted

## 2020-04-26 DIAGNOSIS — R001 Bradycardia, unspecified: Secondary | ICD-10-CM | POA: Diagnosis not present

## 2020-04-26 DIAGNOSIS — Z8673 Personal history of transient ischemic attack (TIA), and cerebral infarction without residual deficits: Secondary | ICD-10-CM | POA: Diagnosis not present

## 2020-04-26 DIAGNOSIS — Z862 Personal history of diseases of the blood and blood-forming organs and certain disorders involving the immune mechanism: Secondary | ICD-10-CM | POA: Diagnosis not present

## 2020-04-26 DIAGNOSIS — I10 Essential (primary) hypertension: Secondary | ICD-10-CM

## 2020-04-26 DIAGNOSIS — Z8719 Personal history of other diseases of the digestive system: Secondary | ICD-10-CM | POA: Diagnosis not present

## 2020-04-26 DIAGNOSIS — I480 Paroxysmal atrial fibrillation: Secondary | ICD-10-CM | POA: Diagnosis not present

## 2020-04-26 DIAGNOSIS — Z7901 Long term (current) use of anticoagulants: Secondary | ICD-10-CM | POA: Diagnosis not present

## 2020-04-26 DIAGNOSIS — I2510 Atherosclerotic heart disease of native coronary artery without angina pectoris: Secondary | ICD-10-CM | POA: Diagnosis not present

## 2020-04-26 DIAGNOSIS — Z885 Allergy status to narcotic agent status: Secondary | ICD-10-CM | POA: Diagnosis not present

## 2020-04-26 DIAGNOSIS — D329 Benign neoplasm of meninges, unspecified: Secondary | ICD-10-CM | POA: Diagnosis not present

## 2020-04-26 DIAGNOSIS — Z993 Dependence on wheelchair: Secondary | ICD-10-CM | POA: Diagnosis not present

## 2020-04-26 DIAGNOSIS — E785 Hyperlipidemia, unspecified: Secondary | ICD-10-CM | POA: Diagnosis not present

## 2020-04-26 DIAGNOSIS — I252 Old myocardial infarction: Secondary | ICD-10-CM | POA: Diagnosis not present

## 2020-04-26 DIAGNOSIS — Z888 Allergy status to other drugs, medicaments and biological substances status: Secondary | ICD-10-CM | POA: Diagnosis not present

## 2020-04-26 DIAGNOSIS — Z79899 Other long term (current) drug therapy: Secondary | ICD-10-CM | POA: Diagnosis not present

## 2020-04-26 DIAGNOSIS — I4819 Other persistent atrial fibrillation: Secondary | ICD-10-CM | POA: Diagnosis not present

## 2020-04-26 DIAGNOSIS — Z9861 Coronary angioplasty status: Secondary | ICD-10-CM | POA: Diagnosis not present

## 2020-04-26 NOTE — Telephone Encounter (Signed)
Pharmacy has question about metoprolol. Pt said dr changed dosage.

## 2020-04-26 NOTE — Telephone Encounter (Signed)
Please contact patient and find out who changed it as I do not see this in his chart.

## 2020-04-27 DIAGNOSIS — R001 Bradycardia, unspecified: Secondary | ICD-10-CM | POA: Diagnosis not present

## 2020-04-28 NOTE — Telephone Encounter (Signed)
Faxed script from [place] shows that Georgette Dover, PA-C located in [place] changed pt Metoprolol Succinate ER 25mg  extended release 24 hr (toprol-XL) 1/2 tab QD back 10/21/19

## 2020-04-28 NOTE — Telephone Encounter (Signed)
Patient states the doctor at [facility] changed him to half dose because he was having dizzy spells.

## 2020-04-30 MED ORDER — METOPROLOL TARTRATE 25 MG PO TABS: 12.5000 mg | ORAL_TABLET | Freq: Every day | ORAL | 1 refills | Status: DC

## 2020-05-04 ENCOUNTER — Other Ambulatory Visit: Payer: Self-pay | Admitting: Family Medicine

## 2020-05-04 ENCOUNTER — Telehealth: Payer: Self-pay | Admitting: *Deleted

## 2020-05-04 DIAGNOSIS — R601 Generalized edema: Secondary | ICD-10-CM

## 2020-05-04 NOTE — Telephone Encounter (Signed)
It appears from documentation in his chart he is on both. Please clarify with patient as I have not seen him recently.

## 2020-05-04 NOTE — Telephone Encounter (Signed)
TC from Stew at [place] Pt is scheduled to have refills, needing to know if pt is suppose to still be on both Eliquis & Plavix Please advise

## 2020-05-05 NOTE — Telephone Encounter (Signed)
[facility] aware and verbalizes understanding.

## 2020-05-05 NOTE — Telephone Encounter (Signed)
Patient states that he is on both plavix & eliquis.  He has an appointment tomorrow and states he does not need more meds before his visit with [place] tomorrow.

## 2020-05-05 NOTE — Telephone Encounter (Signed)
Please make [place] aware.

## 2020-05-06 ENCOUNTER — Encounter: Payer: Self-pay | Admitting: Family Medicine

## 2020-05-06 ENCOUNTER — Ambulatory Visit (INDEPENDENT_AMBULATORY_CARE_PROVIDER_SITE_OTHER): Payer: Medicare Other | Admitting: Family Medicine

## 2020-05-06 ENCOUNTER — Other Ambulatory Visit: Payer: Self-pay

## 2020-05-06 VITALS — BP 108/60 | HR 58 | Temp 98.1°F | Resp 20 | Ht 68.0 in | Wt 173.0 lb

## 2020-05-06 DIAGNOSIS — G609 Hereditary and idiopathic neuropathy, unspecified: Secondary | ICD-10-CM

## 2020-05-06 DIAGNOSIS — D329 Benign neoplasm of meninges, unspecified: Secondary | ICD-10-CM

## 2020-05-06 DIAGNOSIS — R601 Generalized edema: Secondary | ICD-10-CM

## 2020-05-06 DIAGNOSIS — H538 Other visual disturbances: Secondary | ICD-10-CM

## 2020-05-06 DIAGNOSIS — D649 Anemia, unspecified: Secondary | ICD-10-CM

## 2020-05-06 DIAGNOSIS — I10 Essential (primary) hypertension: Secondary | ICD-10-CM | POA: Diagnosis not present

## 2020-05-06 DIAGNOSIS — I714 Abdominal aortic aneurysm, without rupture, unspecified: Secondary | ICD-10-CM

## 2020-05-06 DIAGNOSIS — G8222 Paraplegia, incomplete: Secondary | ICD-10-CM

## 2020-05-06 DIAGNOSIS — I4891 Unspecified atrial fibrillation: Secondary | ICD-10-CM | POA: Diagnosis not present

## 2020-05-06 DIAGNOSIS — R631 Polydipsia: Secondary | ICD-10-CM

## 2020-05-06 DIAGNOSIS — R630 Anorexia: Secondary | ICD-10-CM

## 2020-05-06 DIAGNOSIS — E785 Hyperlipidemia, unspecified: Secondary | ICD-10-CM | POA: Diagnosis not present

## 2020-05-06 DIAGNOSIS — R61 Generalized hyperhidrosis: Secondary | ICD-10-CM

## 2020-05-06 LAB — BAYER DCA HB A1C WAIVED: HB A1C (BAYER DCA - WAIVED): 4.7 % (ref <7.0)

## 2020-05-06 MED ORDER — FUROSEMIDE 20 MG PO TABS: 20.0000 mg | ORAL_TABLET | Freq: Every day | ORAL | 1 refills | Status: DC

## 2020-05-06 MED ORDER — METOPROLOL TARTRATE 25 MG PO TABS: 12.5000 mg | ORAL_TABLET | Freq: Every day | ORAL | 1 refills | Status: DC

## 2020-05-06 NOTE — Progress Notes (Signed)
Assessment & Plan:  1. Atrial fibrillation, unspecified type ([place]) - Well controlled on current regimen.  Advised to discuss cost of Tikosyn with cardiology. - CBC with Differential/Platelet - CMP14+EGFR  2. Essential (primary) hypertension - Well controlled on current regimen.  - metoprolol tartrate (LOPRESSOR) 25 MG tablet; Take 0.5 tablets (12.5 mg total) by mouth daily.  Dispense: 45 tablet; Refill: 1 - CBC with Differential/Platelet - CMP14+EGFR - Lipid panel  3. Abdominal aortic aneurysm (AAA) without rupture ([facility]) - Followed by vascular surgery.  4. Hyperlipidemia, unspecified hyperlipidemia type - Well controlled on current regimen.  - CMP14+EGFR - Lipid panel  5. Generalized edema - Well controlled on current regimen.  - furosemide (LASIX) 20 MG tablet; Take 1 tablet (20 mg total) by mouth daily.  Dispense: 90 tablet; Refill: 1 - CMP14+EGFR  6. Paraplegia, incomplete ([place]) - Patient does well taking care of himself.  He does use a manual wheelchair.  7. Meningioma ([place]) - Followed by neurosurgery.  He was last seen in June 2021 and they advised a follow-up in 3 years with a repeat brain MRI  8. Idiopathic peripheral neuropathy - Well controlled on current regimen.  - CMP14+EGFR  9. Symptomatic anemia - Labs improving. - CBC with Differential/Platelet  10. Blurred vision - Advised to schedule an appointment to have his eyes checked.  11. Excessive sweating - Thyroid Panel With TSH  12. Decreased appetite - Thyroid Panel With TSH  13. Polydipsia - Bayer DCA Hb A1c Waived   Return in about 4 months (around 09/03/2020) for follow-up of chronic medication conditions.  [name], MSN, APRN, FNP-C [facility]  Subjective:    Patient ID: Theodore Mendoza, male    DOB: 01-27-1941, 80 y.o.   MRN: [id number]  Patient Care Team: Loman Brooklyn, FNP as PCP - General (Family Medicine) Loretta Plume as Consulting Physician  (Neurosurgery) Alexis Frock, MD as Consulting Physician (Urology) Dudley Major, MD as Consulting Physician (Cardiology) Lavera Guise, [facility] (Pharmacist)   Chief Complaint:  Chief Complaint  Patient presents with  . Medical Management of Chronic Issues  . Atrial Fibrillation  . Hypertension  . Hyperlipidemia    3 mo     HPI: Theodore Mendoza is a 80 y.o. male presenting on 05/06/2020 for Medical Management of Chronic Issues, Atrial Fibrillation, Hypertension, and Hyperlipidemia (3 mo )  A-Fib: Patient has been started on Tikosyn and had no symptomatic recurrence of his A-Fib since then.  Declines rapid heart rate, shortness of breath, and lower extremity edema.  No recurrent GI bleeding.  He does report that he has and is quite expensive at $90 a month.  He has tried to get Eliquis cheaper through the prescription assistance program at the health department and was told he makes too much money.  This was probably a month ago.  AAA: Patient has seen vascular surgery who does not recommend surgery until he is at least 5 cm due to his multiple comorbidities.  He is to follow-up in 6 months.  Anemia: Patient's last CBC was on 03/17/2020 at which time his hemoglobin and hematocrit levels were improving.  New complaints: Patient reports he has been having blurred vision, sweating at night, decreased appetite, and is really thirsty all of the time.  Social history:  Relevant past medical, surgical, family and social history reviewed and updated as indicated. Interim medical history since our last visit reviewed.  Allergies and medications reviewed and updated.  DATA REVIEWED: CHART  IN EPIC  ROS: Negative unless specifically indicated above in HPI.    Current Outpatient Medications:  .  amLODipine (NORVASC) 5 MG tablet, TAKE 1 TABLET BY MOUTH DAILY., Disp: 90 tablet, Rfl: 0 .  apixaban (ELIQUIS) 5 MG TABS tablet, Take 1 tablet (5 mg total) by mouth 2 (two) times daily. restart 7  days after discharge, Disp: 60 tablet, Rfl: 11 .  clopidogrel (PLAVIX) 75 MG tablet, TAKE 1 TABLET ONCE DAILY, Disp: 90 tablet, Rfl: 1 .  dofetilide (TIKOSYN) 500 MCG capsule, Take 1 capsule (500 mcg total) by mouth 2 (two) times daily., Disp: 60 capsule, Rfl: 2 .  escitalopram (LEXAPRO) 10 MG tablet, TAKE 1 TABLET AT BEDTIME, Disp: 90 tablet, Rfl: 0 .  Ferrous Sulfate (IRON) 325 (65 Fe) MG TABS, Take 1 tablet by mouth daily. , Disp: , Rfl:  .  furosemide (LASIX) 20 MG tablet, TAKE 1 TABLET DAILY, Disp: 30 tablet, Rfl: 0 .  gabapentin (NEURONTIN) 300 MG capsule, TAKE 1 CAPSULE TWICE DAILY AND 2 AT BEDTIME, Disp: 360 capsule, Rfl: 0 .  lisinopril (ZESTRIL) 5 MG tablet, TAKE 1 TABLET ONCE DAILY, Disp: 90 tablet, Rfl: 0 .  metoprolol tartrate (LOPRESSOR) 25 MG tablet, Take 0.5 tablets (12.5 mg total) by mouth daily., Disp: 45 tablet, Rfl: 1 .  pantoprazole (PROTONIX) 40 MG tablet, Take 1 tablet (40 mg total) by mouth 2 (two) times daily., Disp: 60 tablet, Rfl: 2 .  pravastatin (PRAVACHOL) 40 MG tablet, TAKE 1 TABLET ONCE DAILY WITH LUNCH, Disp: 90 tablet, Rfl: 0 .  sucralfate (CARAFATE) 1 g tablet, TAKE  (1)  TABLET  FOUR TIMES DAILY., Disp: 120 tablet, Rfl: 2 .  tamsulosin (FLOMAX) 0.4 MG CAPS capsule, Take 1 capsule (0.4 mg total) by mouth daily., Disp: 30 capsule, Rfl: 0 .  vitamin B-12 (CYANOCOBALAMIN) 1000 MCG tablet, Take 1 tablet (1,000 mcg total) by mouth daily., Disp: 30 tablet, Rfl: 2   Allergies  Allergen Reactions  . Hydrocodone-Acetaminophen   . Morphine And Related Other (See Comments)    Reaction:  Confusion/weakness/hallucinations  . Valsartan Rash   Past Medical History:  Diagnosis Date  . Brain tumor ([place])    x2  . Cardiac arrhythmia   . CHF (congestive heart failure) ([place])   . Guillain-Barre syndrome ([place]) Feb 13 1986  . Hypertension   . Kidney stones   . Myocardial infarction [facility]) 2007    Past Surgical History:  Procedure Laterality Date  . CORONARY ANGIOPLASTY   1997   after mi  . CYSTOSCOPY WITH RETROGRADE PYELOGRAM, URETEROSCOPY AND STENT PLACEMENT Left 06/28/2014   Procedure: 1ST STAGE CYSTOSCOPY/URETEROSCOPY/STENT PLACEMENT;  Surgeon: Alexis Frock, MD;  Location: [facility];  Service: Urology;  Laterality: Left;  . CYSTOSCOPY WITH STENT PLACEMENT Left 05/08/2014   Procedure: CYSTOSCOPY, RETROGRADE PYELOGRAM WITH LEFT URETERAL STENT PLACEMENT;  Surgeon: Alexis Frock, MD;  Location: [facility];  Service: Urology;  Laterality: Left;  . ESOPHAGOGASTRODUODENOSCOPY (EGD) WITH PROPOFOL N/A 12/16/2019   Procedure: ESOPHAGOGASTRODUODENOSCOPY (EGD) WITH PROPOFOL;  Surgeon: Mauri Pole, MD;  Location: [facility];  Service: Endoscopy;  Laterality: N/A;  . EYE SURGERY Right may 2015   growth removed, july 2015left eye cataract removed, right eye catarct removed  . gamma kniferadiation treatment  Feb 09 2014   [facility]  . HEMOSTASIS CLIP PLACEMENT  12/16/2019   Procedure: HEMOSTASIS CLIP PLACEMENT;  Surgeon: Mauri Pole, MD;  Location: [facility];  Service: Endoscopy;;  . HEMOSTASIS CONTROL  12/16/2019  Procedure: HEMOSTASIS CONTROL;  Surgeon: Mauri Pole, MD;  Location: [facility];  Service: Endoscopy;;  Endoloop  . HOLMIUM LASER APPLICATION Left 5/97/4163   Procedure: HOLMIUM LASER APPLICATION;  Surgeon: Alexis Frock, MD;  Location: [facility];  Service: Urology;  Laterality: Left;  . LITHOTRIPSY  years ago  . POLYPECTOMY  12/16/2019   Procedure: POLYPECTOMY;  Surgeon: Mauri Pole, MD;  Location: [facility];  Service: Endoscopy;;  . STONE EXTRACTION WITH BASKET Left 06/28/2014   Procedure: STONE EXTRACTION WITH BASKET;  Surgeon: Alexis Frock, MD;  Location: [facility];  Service: Urology;  Laterality: Left;    Social History   Socioeconomic History  . Marital status: Married    Spouse name: Not on file  . Number of children: Not on file  . Years of education: Not on file  . Highest education level: Not on file   Occupational History  . Not on file  Tobacco Use  . Smoking status: Never Smoker  . Smokeless tobacco: Never Used  Vaping Use  . Vaping Use: Never used  Substance and Sexual Activity  . Alcohol use: No  . Drug use: No  . Sexual activity: Not Currently  Other Topics Concern  . Not on file  Social History Narrative   Lives with wi   Social Determinants of Health   Financial Resource Strain: Not on file  Food Insecurity: Not on file  Transportation Needs: Not on file  Physical Activity: Not on file  Stress: Not on file  Social Connections: Not on file  Intimate Partner Violence: Not on file        Objective:    BP 108/60   Pulse (!) 58   Temp 98.1 F (36.7 C)   Resp 20   Ht 5' 8"  (1.727 m)   Wt 173 lb (78.5 kg)   SpO2 99%   BMI 26.30 kg/m   Wt Readings from Last 3 Encounters:  05/06/20 173 lb (78.5 kg)  03/21/20 173 lb (78.5 kg)  01/29/20 173 lb (78.5 kg)    Physical Exam Vitals reviewed.  Constitutional:      General: He is not in acute distress.    Appearance: Normal appearance. He is overweight. He is not ill-appearing, toxic-appearing or diaphoretic.  HENT:     Head: Normocephalic and atraumatic.  Eyes:     General: No scleral icterus.       Right eye: No discharge.        Left eye: No discharge.     Conjunctiva/sclera: Conjunctivae normal.  Cardiovascular:     Rate and Rhythm: Regular rhythm. Bradycardia present.     Heart sounds: Normal heart sounds. No murmur heard. No friction rub. No gallop.   Pulmonary:     Effort: Pulmonary effort is normal. No respiratory distress.     Breath sounds: Normal breath sounds. No stridor. No wheezing, rhonchi or rales.  Musculoskeletal:        General: Normal range of motion.     Cervical back: Normal range of motion.     Comments: Paralysis from the waist down.  Skin:    General: Skin is warm and dry.  Neurological:     Mental Status: He is alert and oriented to person, place, and time. Mental status is  at baseline.     Gait: Gait abnormal (rides in [facility]).  Psychiatric:        Mood and Affect: Mood normal.        Behavior: Behavior normal.  Thought Content: Thought content normal.        Judgment: Judgment normal.     Lab Results  Component Value Date   TSH 3.241 12/14/2019   Lab Results  Component Value Date   WBC 5.8 03/17/2020   HGB 12.2 (L) 03/17/2020   HCT 37.5 (L) 03/17/2020   MCV 80.6 03/17/2020   PLT 154.0 03/17/2020   Lab Results  Component Value Date   NA 140 12/17/2019   K 3.6 12/17/2019   CO2 24 12/17/2019   GLUCOSE 72 12/17/2019   BUN 18 12/17/2019   CREATININE 0.64 12/17/2019   BILITOT 0.6 12/14/2019   ALKPHOS 57 12/14/2019   AST 13 (L) 12/14/2019   ALT 10 12/14/2019   PROT 5.7 (L) 12/14/2019   ALBUMIN 3.4 (L) 12/14/2019   CALCIUM 8.3 (L) 12/17/2019   ANIONGAP 7 12/17/2019   Lab Results  Component Value Date   CHOL 104 10/21/2019   Lab Results  Component Value Date   HDL 38 (L) 10/21/2019   Lab Results  Component Value Date   LDLCALC 54 10/21/2019   Lab Results  Component Value Date   TRIG 47 10/21/2019   Lab Results  Component Value Date   CHOLHDL 2.7 10/21/2019   Lab Results  Component Value Date   HGBA1C 5.3 10/02/2015

## 2020-05-07 LAB — CMP14+EGFR
ALT: 6 IU/L (ref 0–44)
AST: 13 IU/L (ref 0–40)
Albumin/Globulin Ratio: 2 (ref 1.2–2.2)
Albumin: 3.8 g/dL (ref 3.6–4.6)
Alkaline Phosphatase: 67 IU/L (ref 44–121)
BUN/Creatinine Ratio: 18 (ref 10–24)
BUN: 14 mg/dL (ref 8–27)
Bilirubin Total: 0.7 mg/dL (ref 0.0–1.2)
CO2: 29 mmol/L (ref 20–29)
Calcium: 9.2 mg/dL (ref 8.6–10.2)
Chloride: 105 mmol/L (ref 96–106)
Creatinine, Ser: 0.8 mg/dL (ref 0.76–1.27)
GFR calc Af Amer: 97 mL/min/{1.73_m2} (ref >59)
GFR calc non Af Amer: 84 mL/min/{1.73_m2} (ref >59)
Globulin, Total: 1.9 g/dL (ref 1.5–4.5)
Glucose: 86 mg/dL (ref 65–99)
Potassium: 4.2 mmol/L (ref 3.5–5.2)
Sodium: 145 mmol/L — ABNORMAL HIGH (ref 134–144)
Total Protein: 5.7 g/dL — ABNORMAL LOW (ref 6.0–8.5)

## 2020-05-07 LAB — CBC WITH DIFFERENTIAL/PLATELET
Basophils Absolute: 0.1 10*3/uL (ref 0.0–0.2)
Basos: 2 %
EOS (ABSOLUTE): 0.3 10*3/uL (ref 0.0–0.4)
Eos: 5 %
Hematocrit: 35.8 % — ABNORMAL LOW (ref 37.5–51.0)
Hemoglobin: 11.4 g/dL — ABNORMAL LOW (ref 13.0–17.7)
Immature Grans (Abs): 0 10*3/uL (ref 0.0–0.1)
Immature Granulocytes: 0 %
Lymphocytes Absolute: 1.5 10*3/uL (ref 0.7–3.1)
Lymphs: 26 %
MCH: 26.8 pg (ref 26.6–33.0)
MCHC: 31.8 g/dL (ref 31.5–35.7)
MCV: 84 fL (ref 79–97)
Monocytes Absolute: 0.4 10*3/uL (ref 0.1–0.9)
Monocytes: 6 %
Neutrophils Absolute: 3.5 10*3/uL (ref 1.4–7.0)
Neutrophils: 61 %
Platelets: 165 10*3/uL (ref 150–450)
RBC: 4.25 x10E6/uL (ref 4.14–5.80)
RDW: 14 % (ref 11.6–15.4)
WBC: 5.7 10*3/uL (ref 3.4–10.8)

## 2020-05-07 LAB — LIPID PANEL
Chol/HDL Ratio: 2.6 ratio (ref 0.0–5.0)
Cholesterol, Total: 111 mg/dL (ref 100–199)
HDL: 43 mg/dL (ref >39)
LDL Chol Calc (NIH): 57 mg/dL (ref 0–99)
Triglycerides: 43 mg/dL (ref 0–149)
VLDL Cholesterol Cal: 11 mg/dL (ref 5–40)

## 2020-05-07 LAB — THYROID PANEL WITH TSH
Free Thyroxine Index: 1.9 (ref 1.2–4.9)
T3 Uptake Ratio: 29 % (ref 24–39)
T4, Total: 6.5 ug/dL (ref 4.5–12.0)
TSH: 3.59 u[IU]/mL (ref 0.450–4.500)

## 2020-05-20 ENCOUNTER — Encounter: Payer: Self-pay | Admitting: Nurse Practitioner

## 2020-05-20 ENCOUNTER — Other Ambulatory Visit: Payer: Self-pay

## 2020-05-20 ENCOUNTER — Ambulatory Visit: Payer: Medicare Other | Admitting: Nurse Practitioner

## 2020-05-20 ENCOUNTER — Other Ambulatory Visit (INDEPENDENT_AMBULATORY_CARE_PROVIDER_SITE_OTHER): Payer: Medicare Other

## 2020-05-20 ENCOUNTER — Other Ambulatory Visit: Payer: Self-pay | Admitting: Family Medicine

## 2020-05-20 VITALS — BP 142/56 | HR 60 | Ht 68.0 in

## 2020-05-20 DIAGNOSIS — I10 Essential (primary) hypertension: Secondary | ICD-10-CM

## 2020-05-20 DIAGNOSIS — D509 Iron deficiency anemia, unspecified: Secondary | ICD-10-CM

## 2020-05-20 LAB — CBC WITH DIFFERENTIAL/PLATELET
Basophils Absolute: 0.1 10*3/uL (ref 0.0–0.1)
Basophils Relative: 1 % (ref 0.0–3.0)
Eosinophils Absolute: 0.3 10*3/uL (ref 0.0–0.7)
Eosinophils Relative: 6.4 % — ABNORMAL HIGH (ref 0.0–5.0)
HCT: 34.3 % — ABNORMAL LOW (ref 39.0–52.0)
Hemoglobin: 11.6 g/dL — ABNORMAL LOW (ref 13.0–17.0)
Lymphocytes Relative: 26.4 % (ref 12.0–46.0)
Lymphs Abs: 1.4 10*3/uL (ref 0.7–4.0)
MCHC: 33.8 g/dL (ref 30.0–36.0)
MCV: 82.9 fl (ref 78.0–100.0)
Monocytes Absolute: 0.3 10*3/uL (ref 0.1–1.0)
Monocytes Relative: 4.9 % (ref 3.0–12.0)
Neutro Abs: 3.3 10*3/uL (ref 1.4–7.7)
Neutrophils Relative %: 61.3 % (ref 43.0–77.0)
Platelets: 159 10*3/uL (ref 150.0–400.0)
RBC: 4.13 Mil/uL — ABNORMAL LOW (ref 4.22–5.81)
RDW: 14.4 % (ref 11.5–15.5)
WBC: 5.4 10*3/uL (ref 4.0–10.5)

## 2020-05-20 NOTE — Patient Instructions (Addendum)
If you are age 82 or older, your body mass index should be between 23-30. Your Body mass index is 26.3 kg/m. If this is out of the aforementioned range listed, please consider follow up with your Primary Care Provider.  LABS:  Lab work has been ordered for you today. Our lab is located in the basement. Press "B" on the elevator. The lab is located at the first door on the left as you exit the elevator.  HEALTHCARE LAWS AND MY CHART RESULTS: Due to recent changes in healthcare laws, you may see the results of your imaging and laboratory studies on MyChart before your provider has had a chance to review them.   We understand that in some cases there may be results that are confusing or concerning to you. Not all laboratory results come back in the same time frame and the provider may be waiting for multiple results in order to interpret others.  Please give [place] 48 hours in order for your provider to thoroughly review all the results before contacting the office for clarification of your results.   MEDICATIONS: Continue taking Pantoprazole 40 MG twice a day.       Reduce Carafate to 1GM tablet once a day.  Call to schedule a follow up with Dr. Henrene Pastor in 4 months.  Please call our office if your symptoms worsen. It was great seeing you today! Thank you for entrusting me with your care and choosing [facility].  Noralyn Pick, CRNP

## 2020-05-20 NOTE — Progress Notes (Signed)
05/20/2020 Theodore Mendoza [id number] [birth year]   Chief Complaint: Follow up anemia   History of Present Illness:  Theodore Mendoza is an 80 year old male with a past medical history of hypertension, hypercholesterolemia, coronary artery disease, NSTEMI s/p PTCA distal cx 05/2006, paroxysmal atrial fibrillation 03/2019 s/p cardioversion 05/27/2019 on Eliquis, TIA vs CVA7/2017 on Plavix, neuropathy, Guillain Barre syndrome 1987 and meningioma x 2 followed by neurosurgery.  Refer to office consult 01/29/2020 for comprehensive history review regarding his hospital admission 12/2019 due to having anemia, UGI bleed/melena while on Plavix and Eliquis in the setting of a large infrarenal abdominal aorta aneurysm. S/P EGD 12/16/2019 which identified a 20 mm semisessile polyp with friable ulcerated mucosa with bleeding found in the cardia which was resected and clips were placed. Persistent slow ooze was noted from the surrounding friable mucosa.  IR was consulted without recommendation of embolization of left gastric vessel which would be too difficult for IR intervention given the large aortic aneurysm.  He was placed on Protonix 240mg  mg p.o. twice daily and Carafate qid and ferrous sulfate 325 mg daily.  He presents today accompanied by his wife for scheduled GI follow-up.  He remains on Protonix 40 mg p.o. twice daily and Carafate 1 g p.o. twice daily.  He remains on iron 325 mg p.o. daily.  He reports feeling quite well.  His appetite is good.  He denies any noticeable weight loss.  He is passing a normal formed brown bowel movement daily.  No rectal bleeding or melena.  He has no complaints today.  A repeat CT angio chest/aorta/abd/pelvis was done on 03/17/2020 which was reviewed by his vascular specialist Dr. Trula Slade. His aortic aneurysm measured at 4.7cm, surgery to be considered if the aortic aneurysm diameter reaches 5 cm.  Labs 03/17/2020: Iron 66.  Iron saturation 22.  Ferritin 49.  TIBC  295.  CBC Latest Ref Rng & Units 05/06/2020 03/17/2020 01/29/2020  WBC 3.4 - 10.8 x10E3/uL 5.7 5.8 5.8  Hemoglobin 13.0 - 17.7 g/dL 11.4(L) 12.2(L) 12.0(L)  Hematocrit 37.5 - 51.0 % 35.8(L) 37.5(L) 36.8(L)  Platelets 150 - 450 x10E3/uL 165 154.0 167.0   CMP Latest Ref Rng & Units 05/06/2020 12/17/2019 12/15/2019  Glucose 65 - 99 mg/dL 86 72 99  BUN 8 - 27 mg/dL 14 18 20   Creatinine 0.76 - 1.27 mg/dL 0.80 0.64 0.70  Sodium 134 - 144 mmol/L 145(H) 140 139  Potassium 3.5 - 5.2 mmol/L 4.2 3.6 4.1  Chloride 96 - 106 mmol/L 105 109 104  CO2 20 - 29 mmol/L 29 24 28   Calcium 8.6 - 10.2 mg/dL 9.2 8.3(L) 8.5(L)  Total Protein 6.0 - 8.5 g/dL 5.7(L) - -  Total Bilirubin 0.0 - 1.2 mg/dL 0.7 - -  Alkaline Phos 44 - 121 IU/L 67 - -  AST 0 - 40 IU/L 13 - -  ALT 0 - 44 IU/L 6 - -    Current Outpatient Medications on File Prior to Visit  Medication Sig Dispense Refill  . amLODipine (NORVASC) 5 MG tablet TAKE 1 TABLET BY MOUTH DAILY. 90 tablet 0  . apixaban (ELIQUIS) 5 MG TABS tablet Take 1 tablet (5 mg total) by mouth 2 (two) times daily. restart 7 days after discharge 60 tablet 11  . clopidogrel (PLAVIX) 75 MG tablet TAKE 1 TABLET ONCE DAILY 90 tablet 1  . dofetilide (TIKOSYN) 500 MCG capsule Take 1 capsule (500 mcg total) by mouth 2 (two) times daily. 60 capsule 2  . escitalopram (  LEXAPRO) 10 MG tablet TAKE 1 TABLET AT BEDTIME 90 tablet 0  . Ferrous Sulfate (IRON) 325 (65 Fe) MG TABS Take 1 tablet by mouth daily.     . furosemide (LASIX) 20 MG tablet Take 1 tablet (20 mg total) by mouth daily. 90 tablet 1  . gabapentin (NEURONTIN) 300 MG capsule TAKE 1 CAPSULE TWICE DAILY AND 2 AT BEDTIME 360 capsule 0  . metoprolol tartrate (LOPRESSOR) 25 MG tablet Take 0.5 tablets (12.5 mg total) by mouth daily. 45 tablet 1  . pantoprazole (PROTONIX) 40 MG tablet Take 1 tablet (40 mg total) by mouth 2 (two) times daily. 60 tablet 2  . pravastatin (PRAVACHOL) 40 MG tablet TAKE 1 TABLET ONCE DAILY WITH LUNCH 90 tablet  0  . sucralfate (CARAFATE) 1 g tablet TAKE  (1)  TABLET  FOUR TIMES DAILY. 120 tablet 2  . vitamin B-12 (CYANOCOBALAMIN) 1000 MCG tablet Take 1 tablet (1,000 mcg total) by mouth daily. 30 tablet 2   No current facility-administered medications on file prior to visit.   Allergies  Allergen Reactions  . Hydrocodone-Acetaminophen   . Morphine And Related Other (See Comments)    Reaction:  Confusion/weakness/hallucinations  . Valsartan Rash   Current Medications, Allergies, Past Medical History, Past Surgical History, Family History and Social History were reviewed in [employer] record.  Review of Systems:   Constitutional: Negative for fever, sweats, chills or weight loss.  Respiratory: Negative for shortness of breath.   Cardiovascular: Negative for chest pain, palpitations and leg swelling.  Gastrointestinal: See HPI.  Musculoskeletal: Negative for back pain or muscle aches.  Neurological: Negative for headaches or dizziness.    Physical Exam: BP (!) 142/56   Pulse 60   Ht 5\' 8"  (1.727 m)   BMI 26.30 kg/m  General: 80 year old male presents in a motorized wheel chair in no acute distress. Head: Normocephalic and atraumatic. Eyes: No scleral icterus. Conjunctiva pink . Ears: Normal auditory acuity. Lungs: Clear throughout to auscultation. Heart: Regular rate and rhythm, no murmur. Abdomen: Soft, nontender and nondistended. No hepatomegaly. Normal bowel sounds x 4 quadrants. 3-4 cm aortic aneurysm above the umbilicus.  Rectal: Deferred.  Musculoskeletal: Symmetrical with no gross deformities. Extremities: Bilateral chronic edema.  Neurological: Alert oriented x 4. No focal deficits.  Psychological: Alert and cooperative. Normal mood and affect  Assessment and Recommendations:   79. 80 year old male with acute on chronic anemia, GI bleed while on Plavix and Eliquis. S/P EGD 12/18/2019 showed a arge ulcerated bleeding gastric polyp s/p removal with hot  snare and placement of hemostatic clips X2. There was some slow oozing from surrounding friable mucosa post polypectomy not amenable for IR emobolization. Mild drop in Hg level 12.2 -> 11.4. No overt GI bleeding, no further melena. No abdominal pain.   -CBC today -Continue Ferrous Sulfate 325 mg once daily -Continue Pantoprazole 40 mg p.o. twice daily -Reduce Carafate 1 g p.o. once daily for 2 weeks then stop -Patient to call our office if black stools recur -Follow-up in the office in 4 months -No plans for a colonoscopy   49. 80 year old male with CADMI s/p PTCA 2008, paroxysmal atrial fibrillation, TIA on Plavix and Eliquis.   3. AAA -Continue follow up with Dr. Trula Slade

## 2020-05-21 NOTE — Progress Notes (Signed)
Reviewed. Ok to to reduce PPI to once daily.

## 2020-05-23 ENCOUNTER — Ambulatory Visit (INDEPENDENT_AMBULATORY_CARE_PROVIDER_SITE_OTHER): Payer: Medicare Other | Admitting: *Deleted

## 2020-05-23 VITALS — BP 142/56 | Ht 68.0 in | Wt 173.0 lb

## 2020-05-23 DIAGNOSIS — Z0000 Encounter for general adult medical examination without abnormal findings: Secondary | ICD-10-CM | POA: Diagnosis not present

## 2020-05-23 NOTE — Progress Notes (Signed)
Melissa, can you call this very nice patient and let him know it is ok to reduce his Pantoprazole 40mg  to once daily which he should continue indefinitely. Thx

## 2020-05-23 NOTE — Patient Instructions (Signed)
  Mr. Theodore Mendoza , Thank you for taking time to come for your Medicare Wellness Visit. I appreciate your ongoing commitment to your health goals. Please review the following plan we discussed and let me know if I can assist you in the future.   These are the goals we discussed: Goals    . Client will verbalize knowledge of self management of Hypertension    . Prevent falls       This is a list of the screening recommended for you and due dates:  Health Maintenance  Topic Date Due  . Tetanus Vaccine  05/06/2021*  . Pneumonia vaccines  Completed  . Flu Shot  Discontinued  . COVID-19 Vaccine  Discontinued  *Topic was postponed. The date shown is not the original due date.

## 2020-05-23 NOTE — Progress Notes (Signed)
MEDICARE ANNUAL WELLNESS VISIT  05/23/2020  Telephone Visit Disclaimer This Medicare AWV was conducted by telephone due to national recommendations for restrictions regarding the COVID-19 Pandemic (e.g. social distancing).  I verified, using two identifiers, that I am speaking with Theodore Mendoza or their authorized healthcare agent. I discussed the limitations, risks, security, and privacy concerns of performing an evaluation and management service by telephone and the potential availability of an in-person appointment in the future. The patient expressed understanding and agreed to proceed.  Location of Patient: in his home Location of Provider (nurse):  In office  Subjective:    Theodore Mendoza is a 80 y.o. male patient of Theodore Brooklyn, FNP who had a Medicare Annual Wellness Visit today via telephone. Theodore Mendoza is Retired and lives with their spouse. he has 1 child.  he reports that he is socially active and does interact with friends/family regularly. he is minimally physically active and enjoys watching his grandchildren play ball and going to yardsales.  Patient Care Team: Theodore Brooklyn, FNP as PCP - General (Family Medicine) Loretta Plume as Consulting Physician (Neurosurgery) Alexis Frock, MD as Consulting Physician (Urology) Dudley Major, MD as Consulting Physician (Cardiology) Lavera Guise, [facility] (Pharmacist)  Advanced Directives 05/23/2020 12/14/2019 12/14/2019 11/10/2015 10/23/2015 10/02/2015 10/01/2015  Does Patient Have a Medical Advance Directive? No No No No No No No  Would patient like information on creating a medical advance directive? No - Patient declined No - Patient declined No - Patient declined No - patient declined information No - patient declined information - [facility] Utilization Over the Past 12 Months: # of hospitalizations or ER visits: 2 # of surgeries: 1  Review of Systems    Patient reports that his overall health is worse compared to  last year.  General ROS: negative  Patient Reported Readings (BP, Pulse, CBG, Weight, etc) BP (!) 142/56   Ht 5\' 8"  (1.727 m)   Wt 173 lb (78.5 kg)   BMI 26.30 kg/m    Pain Assessment       Current Medications & Allergies (verified) Allergies as of 05/23/2020      Reactions   Hydrocodone-acetaminophen    Morphine And Related Other (See Comments)   Reaction:  Confusion/weakness/hallucinations   Valsartan Rash      Medication List       Accurate as of May 23, 2020  1:51 PM. If you have any questions, ask your nurse or doctor.        amLODipine 5 MG tablet Commonly known as: NORVASC TAKE 1 TABLET BY MOUTH DAILY.   apixaban 5 MG Tabs tablet Commonly known as: Eliquis Take 1 tablet (5 mg total) by mouth 2 (two) times daily. restart 7 days after discharge   clopidogrel 75 MG tablet Commonly known as: PLAVIX TAKE 1 TABLET ONCE DAILY   dofetilide 500 MCG capsule Commonly known as: Tikosyn Take 1 capsule (500 mcg total) by mouth 2 (two) times daily.   escitalopram 10 MG tablet Commonly known as: LEXAPRO TAKE 1 TABLET AT BEDTIME   furosemide 20 MG tablet Commonly known as: LASIX Take 1 tablet (20 mg total) by mouth daily.   gabapentin 300 MG capsule Commonly known as: NEURONTIN TAKE 1 CAPSULE TWICE DAILY AND 2 AT BEDTIME   Iron 325 (65 Fe) MG Tabs Take 1 tablet by mouth daily.   lisinopril 5 MG tablet Commonly known as: ZESTRIL TAKE 1 TABLET ONCE DAILY   metoprolol  tartrate 25 MG tablet Commonly known as: LOPRESSOR Take 0.5 tablets (12.5 mg total) by mouth daily.   pantoprazole 40 MG tablet Commonly known as: Protonix Take 1 tablet (40 mg total) by mouth 2 (two) times daily.   pravastatin 40 MG tablet Commonly known as: PRAVACHOL TAKE 1 TABLET ONCE DAILY WITH LUNCH   sucralfate 1 g tablet Commonly known as: CARAFATE TAKE  (1)  TABLET  FOUR TIMES DAILY.   tamsulosin 0.4 MG Caps capsule Commonly known as: FLOMAX TAKE 1 CAPSULE ONCE DAILY    vitamin B-12 1000 MCG tablet Commonly known as: CYANOCOBALAMIN Take 1 tablet (1,000 mcg total) by mouth daily.       History (reviewed): Past Medical History:  Diagnosis Date  . Brain tumor ([place])    x2  . Cardiac arrhythmia   . CHF (congestive heart failure) ([place])   . Guillain-Barre syndrome ([place]) Feb 13 1986  . Hypertension   . Kidney stones   . Myocardial infarction [facility]) 2007   Past Surgical History:  Procedure Laterality Date  . CORONARY ANGIOPLASTY  1997   after mi  . CYSTOSCOPY WITH RETROGRADE PYELOGRAM, URETEROSCOPY AND STENT PLACEMENT Left 06/28/2014   Procedure: 1ST STAGE CYSTOSCOPY/URETEROSCOPY/STENT PLACEMENT;  Surgeon: Alexis Frock, MD;  [facility];  Service: Urology;  Laterality: Left;  . CYSTOSCOPY WITH STENT PLACEMENT Left 05/08/2014   Procedure: CYSTOSCOPY, RETROGRADE PYELOGRAM WITH LEFT URETERAL STENT PLACEMENT;  Surgeon: Alexis Frock, MD;  [facility];  Service: Urology;  Laterality: Left;  . ESOPHAGOGASTRODUODENOSCOPY (EGD) WITH PROPOFOL N/A 12/16/2019   Procedure: ESOPHAGOGASTRODUODENOSCOPY (EGD) WITH PROPOFOL;  Surgeon: Mauri Pole, MD;  [facility];  Service: Endoscopy;  Laterality: N/A;  . EYE SURGERY Right may 2015   growth removed, july 2015left eye cataract removed, right eye catarct removed  . gamma kniferadiation treatment  [facility]  . HEMOSTASIS CLIP PLACEMENT  12/16/2019   Procedure: HEMOSTASIS CLIP PLACEMENT;  Surgeon: Mauri Pole, MD;  [facility];  Service: Endoscopy;;  . HEMOSTASIS CONTROL  12/16/2019   Procedure: HEMOSTASIS CONTROL;  Surgeon: Mauri Pole, MD;  [facility];  Service: Endoscopy;;  Endoloop  . HOLMIUM LASER APPLICATION Left 6/78/9381   Procedure: HOLMIUM LASER APPLICATION;  Surgeon: Alexis Frock, MD;  [facility];  Service: Urology;  Laterality: Left;  . LITHOTRIPSY  years ago  . POLYPECTOMY  12/16/2019   Procedure:  POLYPECTOMY;  Surgeon: Mauri Pole, MD;  [facility];  Service: Endoscopy;;  . STONE EXTRACTION WITH BASKET Left 06/28/2014   Procedure: STONE EXTRACTION WITH BASKET;  Surgeon: Alexis Frock, MD;  [facility];  Service: Urology;  Laterality: Left;   Family History  Problem Relation Age of Onset  . Diabetes Mother   . Lung cancer Mother   . CAD Father   . Diabetes Brother   . Diabetes Sister   . Stroke Sister   . Other Maternal Grandfather        brain tumor  . Colon cancer Neg Hx   . Pancreatic cancer Neg Hx   . Esophageal cancer Neg Hx    Social History   Socioeconomic History  . Marital status: Married    Spouse name: Theodore Mendoza   . Number of children: 1  . Years of education: Not on file  . Highest education level: Not on file  Occupational History  . Occupation: retired   Tobacco Use  . Smoking status: Never Smoker  .  Smokeless tobacco: Never Used  Vaping Use  . Vaping Use: Never used  Substance and Sexual Activity  . Alcohol use: No  . Drug use: No  . Sexual activity: Not Currently  Other Topics Concern  . Not on file  Social History Narrative   Lives with wife    Social Determinants of Health   Financial Resource Strain: Not on file  Food Insecurity: Not on file  Transportation Needs: Not on file  Physical Activity: Not on file  Stress: Not on file  Social Connections: Not on file    Activities of Daily Living In your present state of health, do you have any difficulty performing the following activities: 05/23/2020 12/14/2019  Hearing? N -  Vision? Y -  Comment rx glasses -  Difficulty concentrating or making decisions? N -  Walking or climbing stairs? Y -  Dressing or bathing? N -  Doing errands, shopping? [place] Donning  Preparing Food and eating ? N -  Using the Toilet? N -  In the past six months, have you accidently leaked urine? N -  Do you have problems with loss of bowel control? N -  Managing your Medications? N -  Managing  your Finances? N -  Housekeeping or managing your Housekeeping? N -  Some recent data might be hidden    Patient Education/ Literacy    Exercise Current Exercise Habits: Home exercise routine, Type of exercise: Other - see comments (bike indoors), Time (Minutes): 20, Frequency (Times/Week): 3, Weekly Exercise (Minutes/Week): 60, Intensity: Mild, Exercise limited by: neurologic condition(s)  Diet Patient reports consuming 1 meals a day and 2 snack(s) a day Patient reports that his primary diet is: Regular Patient reports that she does have regular access to food.   Depression Screen PHQ 2/9 Scores 05/23/2020 05/06/2020 03/11/2020 02/03/2020 10/21/2019 07/22/2019 05/19/2019  PHQ - 2 Score 1 1 1 5  0 1 3  PHQ- 9 Score - - 10 12 - 11 12     Fall Risk Fall Risk  05/23/2020 05/06/2020 03/11/2020 02/03/2020 12/28/2019  Falls in the past year? 0 0 0 0 0  Number falls in past yr: - - - - -  Injury with Fall? - - - - -  Risk for fall due to : - - - - -  Follow up - - - - -     Objective:  Theodore Mendoza seemed alert and oriented and he participated appropriately during our telephone visit.  Blood Pressure Weight BMI  BP Readings from Last 3 Encounters:  05/23/20 (!) 142/56  05/20/20 (!) 142/56  05/06/20 108/60   Wt Readings from Last 3 Encounters:  05/23/20 173 lb (78.5 kg)  05/06/20 173 lb (78.5 kg)  03/21/20 173 lb (78.5 kg)   BMI Readings from Last 1 Encounters:  05/23/20 26.30 kg/m    *Unable to obtain current vital signs, weight, and BMI due to telephone visit type  Hearing/Vision  . Theodore Mendoza did not seem to have difficulty with hearing/understanding during the telephone conversation . Reports that he has not had a formal eye exam by an eye care professional within the past year . Reports that he has not had a formal hearing evaluation within the past year *Unable to fully assess hearing and vision during telephone visit type  Cognitive Function: 6CIT Screen 05/23/2020 05/19/2019   What Year? 0 points 0 points  What month? 0 points 0 points  What time? 0 points 0 points  Count back from 20 0  points 0 points  Months in reverse 0 points 0 points  Repeat phrase 2 points 2 points  Total Score 2 2   (Normal:0-7, Significant for Dysfunction: >8)  Normal Cognitive Function Screening: Yes   Immunization & Health Maintenance Record Immunization History  Administered Date(s) Administered  . Pneumococcal Conjugate-13 01/15/2019  . Pneumococcal Polysaccharide-23 02/03/2020    Health Maintenance  Topic Date Due  . TETANUS/TDAP  05/06/2021 (Originally 09/17/1959)  . PNA vac Low Risk Adult  Completed  . INFLUENZA VACCINE  Discontinued  . COVID-19 Vaccine  Discontinued       Assessment  This is a routine wellness examination for Theodore Mendoza.  Health Maintenance: Due or Overdue There are no preventive care reminders to display for this patient.  Theodore Mendoza does not need a referral for Community Assistance: Care Management:   no Social Work:    no Prescription Assistance:  no Nutrition/Diabetes Education:  no   Plan:  Personalized Goals Goals Addressed            This Visit's Progress   . Prevent falls   On track     Personalized Health Maintenance & Screening Recommendations  up to date   Lung Cancer Screening Recommended: no (Low Dose CT Chest recommended if Age 17-80 years, 30 pack-year currently smoking OR have quit w/in past 15 years) Hepatitis C Screening recommended: no HIV Screening recommended: no  Advanced Directives: Written information was not prepared per patient's request.  Referrals & Orders No orders of the defined types were placed in this encounter.   Follow-up Plan . Follow-up with Theodore Brooklyn, FNP as planned    I have personally reviewed and noted the following in the patient's chart:   . Medical and social history . Use of alcohol, tobacco or illicit drugs  . Current medications and  supplements . Functional ability and status . Nutritional status . Physical activity . Advanced directives . List of other physicians . Hospitalizations, surgeries, and ER visits in previous 12 months . Vitals . Screenings to include cognitive, depression, and falls . Referrals and appointments  In addition, I have reviewed and discussed with Theodore Mendoza certain preventive protocols, quality metrics, and best practice recommendations. A written personalized care plan for preventive services as well as general preventive health recommendations is available and can be mailed to the patient at his request.      [place] Dec  05/23/2020

## 2020-05-24 ENCOUNTER — Telehealth: Payer: Self-pay

## 2020-05-24 NOTE — Telephone Encounter (Signed)
Per Theodore Mendoza, can you call this very nice patient and let him know it is ok to reduce his Pantoprazole 40mg  to once daily which he should continue indefinitely. Thx   Notified patient of message from [name] , patient expressed understanding and agreement, no further questions at this time.

## 2020-06-06 ENCOUNTER — Telehealth: Payer: Self-pay

## 2020-06-06 DIAGNOSIS — I4891 Unspecified atrial fibrillation: Secondary | ICD-10-CM

## 2020-06-06 MED ORDER — DOFETILIDE 500 MCG PO CAPS: 500.0000 ug | ORAL_CAPSULE | Freq: Two times a day (BID) | ORAL | 3 refills | Status: DC

## 2020-06-06 MED ORDER — APIXABAN 5 MG PO TABS: 5.0000 mg | ORAL_TABLET | Freq: Two times a day (BID) | ORAL | 3 refills | Status: DC

## 2020-06-06 NOTE — Telephone Encounter (Signed)
Patient aware and verbalized understanding. °

## 2020-06-06 NOTE — Telephone Encounter (Signed)
  Prescription Request  06/06/2020  What is the name of the medication or equipment? Pt needs help with apixaban (ELIQUIS) 5 MG  TABS tablet and dofetilide (TIKOSYN) 500 MCG capsule.   Have you contacted your pharmacy to request a refill? (if applicable) He said Almyra Free helps him with this    Which pharmacy would you like this sent to? Not sure where Almyra Free gets it from  Patient notified that their request is being sent to the clinical staff for review and that they should receive a response within 2 business days.

## 2020-06-06 NOTE — Telephone Encounter (Signed)
Spoke with patient states he has not done paper work this year with julie for assistance.  Can we go ahead and start patient with paper work for [employer] rx assistance ?  Please advise

## 2020-06-06 NOTE — Telephone Encounter (Signed)
Yes. I have the form started and will put prescriptions with it.

## 2020-06-29 ENCOUNTER — Other Ambulatory Visit: Payer: Self-pay | Admitting: Family Medicine

## 2020-07-06 DIAGNOSIS — Z961 Presence of intraocular lens: Secondary | ICD-10-CM | POA: Diagnosis not present

## 2020-07-06 DIAGNOSIS — H43812 Vitreous degeneration, left eye: Secondary | ICD-10-CM | POA: Diagnosis not present

## 2020-07-06 DIAGNOSIS — H5005 Alternating esotropia: Secondary | ICD-10-CM | POA: Diagnosis not present

## 2020-07-20 ENCOUNTER — Other Ambulatory Visit: Payer: Self-pay | Admitting: Family Medicine

## 2020-07-20 ENCOUNTER — Encounter: Payer: Self-pay | Admitting: *Deleted

## 2020-07-20 DIAGNOSIS — G610 Guillain-Barre syndrome: Secondary | ICD-10-CM

## 2020-08-03 ENCOUNTER — Other Ambulatory Visit: Payer: Self-pay | Admitting: Family Medicine

## 2020-08-03 DIAGNOSIS — G459 Transient cerebral ischemic attack, unspecified: Secondary | ICD-10-CM

## 2020-08-05 ENCOUNTER — Other Ambulatory Visit: Payer: Self-pay | Admitting: Family Medicine

## 2020-08-05 DIAGNOSIS — F419 Anxiety disorder, unspecified: Secondary | ICD-10-CM

## 2020-08-08 ENCOUNTER — Ambulatory Visit (INDEPENDENT_AMBULATORY_CARE_PROVIDER_SITE_OTHER): Payer: Medicare Other | Admitting: Nurse Practitioner

## 2020-08-08 ENCOUNTER — Other Ambulatory Visit: Payer: Self-pay

## 2020-08-08 ENCOUNTER — Encounter: Payer: Self-pay | Admitting: Nurse Practitioner

## 2020-08-08 VITALS — BP 94/45 | HR 64 | Temp 97.7°F

## 2020-08-08 DIAGNOSIS — M79662 Pain in left lower leg: Secondary | ICD-10-CM | POA: Diagnosis not present

## 2020-08-08 DIAGNOSIS — M79605 Pain in left leg: Secondary | ICD-10-CM | POA: Diagnosis not present

## 2020-08-08 DIAGNOSIS — M79661 Pain in right lower leg: Secondary | ICD-10-CM

## 2020-08-08 DIAGNOSIS — W19XXXA Unspecified fall, initial encounter: Secondary | ICD-10-CM | POA: Insufficient documentation

## 2020-08-08 DIAGNOSIS — M79604 Pain in right leg: Secondary | ICD-10-CM

## 2020-08-08 NOTE — Progress Notes (Signed)
Acute Office Visit  Subjective:    Patient ID: Theodore Mendoza, male    DOB: Nov 07, 1940, 80 y.o.   MRN: [id number]  Chief Complaint  Patient presents with  . Leg Pain  . Fall    Leg Pain  Incident onset: 9 days ago. The incident occurred at home. The injury mechanism was a fall. The pain is present in the left thigh and right thigh. The quality of the pain is described as aching. The pain is moderate. The pain has been constant since onset. Associated symptoms include an inability to bear weight. Pertinent negatives include no numbness or tingling. He reports no foreign bodies present. The symptoms are aggravated by movement. He has tried heat for the symptoms. The treatment provided no relief.  Fall Incident onset: 9 days ago. The fall occurred while running. He landed on carpet. There was no blood loss. The pain is present in the right lower leg and left lower leg. The pain is moderate. The symptoms are aggravated by movement. Pertinent negatives include no bowel incontinence, loss of consciousness, numbness or tingling. He has tried heat for the symptoms. The treatment provided no relief.     Past Medical History:  Diagnosis Date  . Brain tumor ([place])    x2  . Cardiac arrhythmia   . CHF (congestive heart failure) ([place])   . Guillain-Barre syndrome ([place]) Feb 13 1986  . Hypertension   . Kidney stones   . Myocardial infarction [facility]) 2007    Past Surgical History:  Procedure Laterality Date  . CORONARY ANGIOPLASTY  1997   after mi  . CYSTOSCOPY WITH RETROGRADE PYELOGRAM, URETEROSCOPY AND STENT PLACEMENT Left 06/28/2014   Procedure: 1ST STAGE CYSTOSCOPY/URETEROSCOPY/STENT PLACEMENT;  Surgeon: Alexis Frock, MD;  [facility];  Service: Urology;  Laterality: Left;  . CYSTOSCOPY WITH STENT PLACEMENT Left 05/08/2014   Procedure: CYSTOSCOPY, RETROGRADE PYELOGRAM WITH LEFT URETERAL STENT PLACEMENT;  Surgeon: Alexis Frock, MD;  [facility];  Service: Urology;  Laterality: Left;   . ESOPHAGOGASTRODUODENOSCOPY (EGD) WITH PROPOFOL N/A 12/16/2019   Procedure: ESOPHAGOGASTRODUODENOSCOPY (EGD) WITH PROPOFOL;  Surgeon: Mauri Pole, MD;  [facility];  Service: Endoscopy;  Laterality: N/A;  . EYE SURGERY Right may 2015   growth removed, july 2015left eye cataract removed, right eye catarct removed  . gamma kniferadiation treatment  [facility] 09 [facility]  . HEMOSTASIS CLIP PLACEMENT  12/16/2019   Procedure: HEMOSTASIS CLIP PLACEMENT;  Surgeon: Mauri Pole, MD;  [facility];  Service: Endoscopy;;  . HEMOSTASIS CONTROL  12/16/2019   Procedure: HEMOSTASIS CONTROL;  Surgeon: Mauri Pole, MD;  [facility];  Service: Endoscopy;;  Endoloop  . HOLMIUM LASER APPLICATION Left 2/29/7989   Procedure: HOLMIUM LASER APPLICATION;  Surgeon: Alexis Frock, MD;  [facility];  Service: Urology;  Laterality: Left;  . LITHOTRIPSY  years ago  . POLYPECTOMY  12/16/2019   Procedure: POLYPECTOMY;  Surgeon: Mauri Pole, MD;  [facility];  Service: Endoscopy;;  . STONE EXTRACTION WITH BASKET Left 06/28/2014   Procedure: STONE EXTRACTION WITH BASKET;  Surgeon: Alexis Frock, MD;  [facility];  Service: Urology;  Laterality: Left;    Family History  Problem Relation Age of Onset  . Diabetes Mother   . Lung cancer Mother   . CAD Father   . Diabetes Brother   . Diabetes Sister   . Stroke Sister   . Other Maternal Grandfather  brain tumor  . Colon cancer Neg Hx   . Pancreatic cancer Neg Hx   . Esophageal cancer Neg Hx     Social History   Socioeconomic History  . Marital status: Married    Spouse name: shirley   . Number of children: 1  . Years of education: Not on file  . Highest education level: Not on file  Occupational History  . Occupation: retired   Tobacco Use  . Smoking status: Never Smoker  . Smokeless tobacco: Never Used  Vaping Use  . Vaping Use: Never used  Substance  and Sexual Activity  . Alcohol use: No  . Drug use: No  . Sexual activity: Not Currently  Other Topics Concern  . Not on file  Social History Narrative   Lives with wife    Social Determinants of Health   Financial Resource Strain: Not on file  Food Insecurity: Not on file  Transportation Needs: Not on file  Physical Activity: Not on file  Stress: Not on file  Social Connections: Not on file  Intimate Partner Violence: Not on file    Outpatient Medications Prior to Visit  Medication Sig Dispense Refill  . amLODipine (NORVASC) 5 MG tablet TAKE 1 TABLET BY MOUTH DAILY. 90 tablet 0  . apixaban (ELIQUIS) 5 MG TABS tablet Take 1 tablet (5 mg total) by mouth 2 (two) times daily. 180 tablet 3  . clopidogrel (PLAVIX) 75 MG tablet TAKE 1 TABLET ONCE DAILY 90 tablet 1  . dofetilide (TIKOSYN) 500 MCG capsule Take 1 capsule (500 mcg total) by mouth 2 (two) times daily. 180 capsule 3  . escitalopram (LEXAPRO) 10 MG tablet TAKE 1 TABLET AT BEDTIME 90 tablet 0  . Ferrous Sulfate (IRON) 325 (65 Fe) MG TABS Take 1 tablet by mouth daily.     . furosemide (LASIX) 20 MG tablet Take 1 tablet (20 mg total) by mouth daily. 90 tablet 1  . gabapentin (NEURONTIN) 300 MG capsule TAKE 1 CAPSULE TWICE DAILY AND 2 AT BEDTIME 360 capsule 0  . lisinopril (ZESTRIL) 5 MG tablet TAKE 1 TABLET ONCE DAILY 90 tablet 1  . metoprolol tartrate (LOPRESSOR) 25 MG tablet Take 0.5 tablets (12.5 mg total) by mouth daily. 45 tablet 1  . pantoprazole (PROTONIX) 40 MG tablet TAKE  (1)  TABLET TWICE A DAY. 60 tablet 2  . pravastatin (PRAVACHOL) 40 MG tablet TAKE 1 TABLET ONCE DAILY WITH LUNCH 90 tablet 0  . sucralfate (CARAFATE) 1 g tablet TAKE  (1)  TABLET  FOUR TIMES DAILY. 120 tablet 2  . tamsulosin (FLOMAX) 0.4 MG CAPS capsule TAKE 1 CAPSULE ONCE DAILY 30 capsule 2  . vitamin B-12 (CYANOCOBALAMIN) 1000 MCG tablet Take 1 tablet (1,000 mcg total) by mouth daily. 30 tablet 2   No facility-administered medications prior to  visit.    Allergies  Allergen Reactions  . Hydrocodone-Acetaminophen   . Morphine And Related Other (See Comments)    Reaction:  Confusion/weakness/hallucinations  . Valsartan Rash    Review of Systems  Constitutional: Negative.   HENT: Negative.   Respiratory: Negative.   Cardiovascular: Negative.   Gastrointestinal: Negative for bowel incontinence.  Musculoskeletal: Positive for myalgias.  Skin: Positive for color change.  Neurological: Negative for tingling, loss of consciousness and numbness.  All other systems reviewed and are negative.      Objective:    Physical Exam Vitals reviewed.  Constitutional:      Appearance: Normal appearance.  HENT:     Head:  Normocephalic.     Nose: Nose normal.  Eyes:     Conjunctiva/sclera: Conjunctivae normal.  Cardiovascular:     Rate and Rhythm: Normal rate and regular rhythm.     Pulses: Normal pulses.     Heart sounds: Normal heart sounds.  Pulmonary:     Effort: Pulmonary effort is normal.     Breath sounds: Normal breath sounds.  Abdominal:     General: Bowel sounds are normal.  Musculoskeletal:       Legs:     Comments: Bilateral thigh pain.  Skin:    Findings: Bruising present.  Neurological:     Mental Status: He is alert and oriented to person, place, and time.  Psychiatric:        Behavior: Behavior normal.     There were no vitals taken for this visit. Wt Readings from Last 3 Encounters:  05/23/20 173 lb (78.5 kg)  05/06/20 173 lb (78.5 kg)  03/21/20 173 lb (78.5 kg)    There are no preventive care reminders to display for this patient.  There are no preventive care reminders to display for this patient.   Lab Results  Component Value Date   TSH 3.590 05/06/2020   Lab Results  Component Value Date   WBC 5.4 05/20/2020   HGB 11.6 (L) 05/20/2020   HCT 34.3 (L) 05/20/2020   MCV 82.9 05/20/2020   PLT 159.0 05/20/2020   Lab Results  Component Value Date   NA 145 (H) 05/06/2020   K 4.2  05/06/2020   CO2 29 05/06/2020   GLUCOSE 86 05/06/2020   BUN 14 05/06/2020   CREATININE 0.80 05/06/2020   BILITOT 0.7 05/06/2020   ALKPHOS 67 05/06/2020   AST 13 05/06/2020   ALT 6 05/06/2020   PROT 5.7 (L) 05/06/2020   ALBUMIN 3.8 05/06/2020   CALCIUM 9.2 05/06/2020   ANIONGAP 7 12/17/2019   Lab Results  Component Value Date   CHOL 111 05/06/2020   Lab Results  Component Value Date   HDL 43 05/06/2020   Lab Results  Component Value Date   LDLCALC 57 05/06/2020   Lab Results  Component Value Date   TRIG 43 05/06/2020   Lab Results  Component Value Date   CHOLHDL 2.6 05/06/2020   Lab Results  Component Value Date   HGBA1C 4.7 05/06/2020       Assessment & Plan:  Fall Recent fall in the past 9 days.  Patient fell at home while trying to transfer.  Patient is reporting moderate to severe pain in bilateral upper thigh.  Patient reports when he fell he fell on top of his thighs with legs bent behind thighs.  Education provided to patient for fall prevention at home.  Patient verbalized understanding.  Advised patient to use Voltaren topical gel for pain.  Ice compress.  Follow-up with worsening or unresolved symptoms  Completed medication management appointment with clinic pharmacy.  Patient's blood pressure is very very low [hypotension] patient is on 3 different blood pressure medications and will need to be tapered off of 102.  Advised patient to hold blood pressure medication for this evening and recheck blood pressure in the a.m. before resuming medication.  Patient is not showing any signs and symptoms of dehydration we will continue to follow-up. Orthostatic blood pressure unable to be completed due to patient's inability to bear weight or stand at the moment.  Problem List Items Addressed This Visit   None      No orders  of the defined types were placed in this encounter.    Ivy Lynn, NP

## 2020-08-08 NOTE — Patient Instructions (Signed)
Fall Prevention in the Home, Adult Falls can cause injuries and can happen to people of all ages. There are many things you can do to make your home safe and to help prevent falls. Ask for help when making these changes. What actions can I take to prevent falls? General Instructions  Use good lighting in all rooms. Replace any light bulbs that burn out.  Turn on the lights in dark areas. Use night-lights.  Keep items that you use often in easy-to-reach places. Lower the shelves around your home if needed.  Set up your furniture so you have a clear path. Avoid moving your furniture around.  Do not have throw rugs or other things on the floor that can make you trip.  Avoid walking on wet floors.  If any of your floors are uneven, fix them.  Add color or contrast paint or tape to clearly mark and help you see: ? Grab bars or handrails. ? First and last steps of staircases. ? Where the edge of each step is.  If you use a stepladder: ? Make sure that it is fully opened. Do not climb a closed stepladder. ? Make sure the sides of the stepladder are locked in place. ? Ask someone to hold the stepladder while you use it.  Know where your pets are when moving through your home. What can I do in the bathroom?  Keep the floor dry. Clean up any water on the floor right away.  Remove soap buildup in the tub or shower.  Use nonskid mats or decals on the floor of the tub or shower.  Attach bath mats securely with double-sided, nonslip rug tape.  If you need to sit down in the shower, use a plastic, nonslip stool.  Install grab bars by the toilet and in the tub and shower. Do not use towel bars as grab bars.      What can I do in the bedroom?  Make sure that you have a light by your bed that is easy to reach.  Do not use any sheets or blankets for your bed that hang to the floor.  Have a firm chair with side arms that you can use for support when you get dressed. What can I do in  the kitchen?  Clean up any spills right away.  If you need to reach something above you, use a step stool with a grab bar.  Keep electrical cords out of the way.  Do not use floor polish or wax that makes floors slippery. What can I do with my stairs?  Do not leave any items on the stairs.  Make sure that you have a light switch at the top and the bottom of the stairs.  Make sure that there are handrails on both sides of the stairs. Fix handrails that are broken or loose.  Install nonslip stair treads on all your stairs.  Avoid having throw rugs at the top or bottom of the stairs.  Choose a carpet that does not hide the edge of the steps on the stairs.  Check carpeting to make sure that it is firmly attached to the stairs. Fix carpet that is loose or worn. What can I do on the outside of my home?  Use bright outdoor lighting.  Fix the edges of walkways and driveways and fix any cracks.  Remove anything that might make you trip as you walk through a door, such as a raised step or threshold.  Trim any   bushes or trees on paths to your home.  Check to see if handrails are loose or broken and that both sides of all steps have handrails.  Install guardrails along the edges of any raised decks and porches.  Clear paths of anything that can make you trip, such as tools or rocks.  Have leaves, snow, or ice cleared regularly.  Use sand or salt on paths during winter.  Clean up any spills in your garage right away. This includes grease or oil spills. What other actions can I take?  Wear shoes that: ? Have a low heel. Do not wear high heels. ? Have rubber bottoms. ? Feel good on your feet and fit well. ? Are closed at the toe. Do not wear open-toe sandals.  Use tools that help you move around if needed. These include: ? Canes. ? Walkers. ? Scooters. ? Crutches.  Review your medicines with your doctor. Some medicines can make you feel dizzy. This can increase your chance  of falling. Ask your doctor what else you can do to help prevent falls. Where to find more information  Centers for Disease Control and Prevention, [employer]: www.cdc.gov  National Institute on Aging: www.nia.nih.gov Contact a doctor if:  You are afraid of falling at home.  You feel weak, drowsy, or dizzy at home.  You fall at home. Summary  There are many simple things that you can do to make your home safe and to help prevent falls.  Ways to make your home safe include removing things that can make you trip and installing grab bars in the bathroom.  Ask for help when making these changes in your home. This information is not intended to replace advice given to you by your health care provider. Make sure you discuss any questions you have with your health care provider. Document Revised: 10/21/2019 Document Reviewed: 10/21/2019 Elsevier Patient Education  2021 Elsevier Inc.  

## 2020-08-08 NOTE — Assessment & Plan Note (Signed)
Recent fall in the past 9 days.  Patient fell at home while trying to transfer.  Patient is reporting moderate to severe pain in bilateral upper thigh.  Patient reports when he fell he fell on top of his thighs with legs bent behind thighs.  Education provided to patient for fall prevention at home.  Patient verbalized understanding.  Advised patient to use Voltaren topical gel for pain.  Ice compress.  Follow-up with worsening or unresolved symptoms  Completed medication management appointment with clinic pharmacy.  Patient's blood pressure is very very low [hypotension] patient is on 3 different blood pressure medications and will need to be tapered off of 102.  Advised patient to hold blood pressure medication for this evening and recheck blood pressure in the a.m. before resuming medication.  Patient is not showing any signs and symptoms of dehydration we will continue to follow-up. Orthostatic blood pressure unable to be completed due to patient's inability to bear weight or stand at the moment.

## 2020-08-11 ENCOUNTER — Encounter (HOSPITAL_COMMUNITY): Payer: Self-pay

## 2020-08-11 ENCOUNTER — Emergency Department (HOSPITAL_COMMUNITY): Payer: Medicare Other

## 2020-08-11 ENCOUNTER — Other Ambulatory Visit: Payer: Self-pay

## 2020-08-11 ENCOUNTER — Inpatient Hospital Stay (HOSPITAL_COMMUNITY)
Admission: EM | Admit: 2020-08-11 | Discharge: 2020-08-14 | DRG: 378 | Disposition: A | Discharge status: Home / Self Care | Payer: Medicare Other | Attending: Family Medicine | Admitting: Family Medicine

## 2020-08-11 DIAGNOSIS — K8689 Other specified diseases of pancreas: Secondary | ICD-10-CM | POA: Diagnosis present

## 2020-08-11 DIAGNOSIS — I6990 Unspecified sequelae of unspecified cerebrovascular disease: Secondary | ICD-10-CM

## 2020-08-11 DIAGNOSIS — D329 Benign neoplasm of meninges, unspecified: Secondary | ICD-10-CM | POA: Diagnosis present

## 2020-08-11 DIAGNOSIS — K922 Gastrointestinal hemorrhage, unspecified: Secondary | ICD-10-CM | POA: Diagnosis not present

## 2020-08-11 DIAGNOSIS — I509 Heart failure, unspecified: Secondary | ICD-10-CM | POA: Diagnosis not present

## 2020-08-11 DIAGNOSIS — I714 Abdominal aortic aneurysm, without rupture, unspecified: Secondary | ICD-10-CM | POA: Diagnosis present

## 2020-08-11 DIAGNOSIS — K648 Other hemorrhoids: Secondary | ICD-10-CM | POA: Diagnosis present

## 2020-08-11 DIAGNOSIS — Z833 Family history of diabetes mellitus: Secondary | ICD-10-CM

## 2020-08-11 DIAGNOSIS — I2510 Atherosclerotic heart disease of native coronary artery without angina pectoris: Secondary | ICD-10-CM | POA: Diagnosis present

## 2020-08-11 DIAGNOSIS — Z885 Allergy status to narcotic agent status: Secondary | ICD-10-CM

## 2020-08-11 DIAGNOSIS — K644 Residual hemorrhoidal skin tags: Secondary | ICD-10-CM | POA: Diagnosis present

## 2020-08-11 DIAGNOSIS — D649 Anemia, unspecified: Secondary | ICD-10-CM | POA: Diagnosis present

## 2020-08-11 DIAGNOSIS — K5730 Diverticulosis of large intestine without perforation or abscess without bleeding: Secondary | ICD-10-CM | POA: Diagnosis present

## 2020-08-11 DIAGNOSIS — N281 Cyst of kidney, acquired: Secondary | ICD-10-CM | POA: Diagnosis present

## 2020-08-11 DIAGNOSIS — Z20822 Contact with and (suspected) exposure to covid-19: Secondary | ICD-10-CM | POA: Diagnosis not present

## 2020-08-11 DIAGNOSIS — N390 Urinary tract infection, site not specified: Secondary | ICD-10-CM | POA: Diagnosis not present

## 2020-08-11 DIAGNOSIS — I4891 Unspecified atrial fibrillation: Secondary | ICD-10-CM | POA: Diagnosis not present

## 2020-08-11 DIAGNOSIS — G609 Hereditary and idiopathic neuropathy, unspecified: Secondary | ICD-10-CM | POA: Diagnosis present

## 2020-08-11 DIAGNOSIS — G8222 Paraplegia, incomplete: Secondary | ICD-10-CM | POA: Diagnosis present

## 2020-08-11 DIAGNOSIS — R531 Weakness: Secondary | ICD-10-CM | POA: Diagnosis not present

## 2020-08-11 DIAGNOSIS — J329 Chronic sinusitis, unspecified: Secondary | ICD-10-CM | POA: Diagnosis present

## 2020-08-11 DIAGNOSIS — R110 Nausea: Secondary | ICD-10-CM | POA: Diagnosis present

## 2020-08-11 DIAGNOSIS — K317 Polyp of stomach and duodenum: Secondary | ICD-10-CM | POA: Diagnosis present

## 2020-08-11 DIAGNOSIS — D6832 Hemorrhagic disorder due to extrinsic circulating anticoagulants: Secondary | ICD-10-CM | POA: Diagnosis present

## 2020-08-11 DIAGNOSIS — K625 Hemorrhage of anus and rectum: Secondary | ICD-10-CM | POA: Diagnosis not present

## 2020-08-11 DIAGNOSIS — Z888 Allergy status to other drugs, medicaments and biological substances status: Secondary | ICD-10-CM

## 2020-08-11 DIAGNOSIS — W1830XA Fall on same level, unspecified, initial encounter: Secondary | ICD-10-CM | POA: Diagnosis present

## 2020-08-11 DIAGNOSIS — Z823 Family history of stroke: Secondary | ICD-10-CM

## 2020-08-11 DIAGNOSIS — N400 Enlarged prostate without lower urinary tract symptoms: Secondary | ICD-10-CM | POA: Diagnosis present

## 2020-08-11 DIAGNOSIS — Z801 Family history of malignant neoplasm of trachea, bronchus and lung: Secondary | ICD-10-CM

## 2020-08-11 DIAGNOSIS — D62 Acute posthemorrhagic anemia: Secondary | ICD-10-CM

## 2020-08-11 DIAGNOSIS — K921 Melena: Secondary | ICD-10-CM | POA: Diagnosis not present

## 2020-08-11 DIAGNOSIS — I959 Hypotension, unspecified: Secondary | ICD-10-CM | POA: Diagnosis present

## 2020-08-11 DIAGNOSIS — I10 Essential (primary) hypertension: Secondary | ICD-10-CM | POA: Diagnosis not present

## 2020-08-11 DIAGNOSIS — G610 Guillain-Barre syndrome: Secondary | ICD-10-CM | POA: Diagnosis not present

## 2020-08-11 DIAGNOSIS — Z8673 Personal history of transient ischemic attack (TIA), and cerebral infarction without residual deficits: Secondary | ICD-10-CM

## 2020-08-11 DIAGNOSIS — I69998 Other sequelae following unspecified cerebrovascular disease: Secondary | ICD-10-CM | POA: Diagnosis not present

## 2020-08-11 DIAGNOSIS — Z9861 Coronary angioplasty status: Secondary | ICD-10-CM

## 2020-08-11 DIAGNOSIS — I480 Paroxysmal atrial fibrillation: Secondary | ICD-10-CM

## 2020-08-11 DIAGNOSIS — M21372 Foot drop, left foot: Secondary | ICD-10-CM | POA: Diagnosis present

## 2020-08-11 DIAGNOSIS — Z79899 Other long term (current) drug therapy: Secondary | ICD-10-CM

## 2020-08-11 DIAGNOSIS — R42 Dizziness and giddiness: Secondary | ICD-10-CM | POA: Diagnosis not present

## 2020-08-11 DIAGNOSIS — Z7902 Long term (current) use of antithrombotics/antiplatelets: Secondary | ICD-10-CM

## 2020-08-11 DIAGNOSIS — I4819 Other persistent atrial fibrillation: Secondary | ICD-10-CM | POA: Diagnosis present

## 2020-08-11 DIAGNOSIS — E785 Hyperlipidemia, unspecified: Secondary | ICD-10-CM | POA: Diagnosis not present

## 2020-08-11 DIAGNOSIS — R140 Abdominal distension (gaseous): Secondary | ICD-10-CM | POA: Diagnosis not present

## 2020-08-11 DIAGNOSIS — R509 Fever, unspecified: Secondary | ICD-10-CM

## 2020-08-11 DIAGNOSIS — Z7901 Long term (current) use of anticoagulants: Secondary | ICD-10-CM

## 2020-08-11 DIAGNOSIS — M21371 Foot drop, right foot: Secondary | ICD-10-CM | POA: Diagnosis present

## 2020-08-11 DIAGNOSIS — Z8249 Family history of ischemic heart disease and other diseases of the circulatory system: Secondary | ICD-10-CM

## 2020-08-11 DIAGNOSIS — K2289 Other specified disease of esophagus: Secondary | ICD-10-CM | POA: Diagnosis not present

## 2020-08-11 DIAGNOSIS — I252 Old myocardial infarction: Secondary | ICD-10-CM

## 2020-08-11 DIAGNOSIS — Z993 Dependence on wheelchair: Secondary | ICD-10-CM

## 2020-08-11 DIAGNOSIS — Z886 Allergy status to analgesic agent status: Secondary | ICD-10-CM

## 2020-08-11 HISTORY — DX: Abdominal aortic aneurysm, without rupture, unspecified: I71.40

## 2020-08-11 HISTORY — DX: Abdominal aortic aneurysm, without rupture: I71.4

## 2020-08-11 LAB — CBC WITH DIFFERENTIAL/PLATELET
Abs Immature Granulocytes: 0.06 10*3/uL (ref 0.00–0.07)
Basophils Absolute: 0.1 10*3/uL (ref 0.0–0.1)
Basophils Relative: 1 %
Eosinophils Absolute: 0.1 10*3/uL (ref 0.0–0.5)
Eosinophils Relative: 2 %
HCT: 27.2 % — ABNORMAL LOW (ref 39.0–52.0)
Hemoglobin: 8.4 g/dL — ABNORMAL LOW (ref 13.0–17.0)
Immature Granulocytes: 1 %
Lymphocytes Relative: 9 %
Lymphs Abs: 0.5 10*3/uL — ABNORMAL LOW (ref 0.7–4.0)
MCH: 28.8 pg (ref 26.0–34.0)
MCHC: 30.9 g/dL (ref 30.0–36.0)
MCV: 93.2 fL (ref 80.0–100.0)
Monocytes Absolute: 0.3 10*3/uL (ref 0.1–1.0)
Monocytes Relative: 4 %
Neutro Abs: 5.1 10*3/uL (ref 1.7–7.7)
Neutrophils Relative %: 83 %
Platelets: 250 10*3/uL (ref 150–400)
RBC: 2.92 MIL/uL — ABNORMAL LOW (ref 4.22–5.81)
RDW: 14.1 % (ref 11.5–15.5)
WBC: 6.1 10*3/uL (ref 4.0–10.5)
nRBC: 0 % (ref 0.0–0.2)

## 2020-08-11 LAB — TROPONIN I (HIGH SENSITIVITY)
Troponin I (High Sensitivity): 5 ng/L (ref <18)
Troponin I (High Sensitivity): 5 ng/L (ref <18)

## 2020-08-11 LAB — RESP PANEL BY RT-PCR (FLU A&B, COVID) ARPGX2
Influenza A by PCR: NEGATIVE
Influenza B by PCR: NEGATIVE
SARS Coronavirus 2 by RT PCR: NEGATIVE

## 2020-08-11 LAB — COMPREHENSIVE METABOLIC PANEL
ALT: 7 U/L (ref 0–44)
AST: 12 U/L — ABNORMAL LOW (ref 15–41)
Albumin: 3.4 g/dL — ABNORMAL LOW (ref 3.5–5.0)
Alkaline Phosphatase: 57 U/L (ref 38–126)
Anion gap: 5 (ref 5–15)
BUN: 17 mg/dL (ref 8–23)
CO2: 31 mmol/L (ref 22–32)
Calcium: 8.9 mg/dL (ref 8.9–10.3)
Chloride: 107 mmol/L (ref 98–111)
Creatinine, Ser: 0.78 mg/dL (ref 0.61–1.24)
GFR, Estimated: >60 mL/min (ref >60)
Glucose, Bld: 123 mg/dL — ABNORMAL HIGH (ref 70–99)
Potassium: 4.3 mmol/L (ref 3.5–5.1)
Sodium: 143 mmol/L (ref 135–145)
Total Bilirubin: 1.1 mg/dL (ref 0.3–1.2)
Total Protein: 6 g/dL — ABNORMAL LOW (ref 6.5–8.1)

## 2020-08-11 LAB — POC OCCULT BLOOD, ED: Fecal Occult Bld: POSITIVE — AB

## 2020-08-11 LAB — HEMOGLOBIN AND HEMATOCRIT, BLOOD
HCT: 27.2 % — ABNORMAL LOW (ref 39.0–52.0)
Hemoglobin: 8.4 g/dL — ABNORMAL LOW (ref 13.0–17.0)

## 2020-08-11 MED ORDER — FUROSEMIDE 20 MG PO TABS
20.0000 mg | ORAL_TABLET | Freq: Every day | ORAL | Status: DC
  Administered 2020-08-12 – 2020-08-14 (×3): 20 mg via ORAL
  Filled 2020-08-11 (×3): qty 1

## 2020-08-11 MED ORDER — SODIUM CHLORIDE 0.9 % IV BOLUS
500.0000 mL | Freq: Once | INTRAVENOUS | Status: AC
  Administered 2020-08-11: 500 mL via INTRAVENOUS

## 2020-08-11 MED ORDER — SODIUM CHLORIDE 0.9 % IV SOLN: INTRAVENOUS | Status: DC

## 2020-08-11 MED ORDER — FERROUS SULFATE 325 (65 FE) MG PO TABS
325.0000 mg | ORAL_TABLET | Freq: Every day | ORAL | Status: DC
  Administered 2020-08-11: 325 mg via ORAL
  Filled 2020-08-11: qty 1

## 2020-08-11 MED ORDER — HYDRALAZINE HCL 20 MG/ML IJ SOLN: 5.0000 mg | Freq: Three times a day (TID) | INTRAMUSCULAR | Status: DC | PRN

## 2020-08-11 MED ORDER — ONDANSETRON HCL 4 MG/2ML IJ SOLN
4.0000 mg | Freq: Four times a day (QID) | INTRAMUSCULAR | Status: DC | PRN
  Administered 2020-08-11 – 2020-08-13 (×2): 4 mg via INTRAVENOUS
  Filled 2020-08-11 (×2): qty 2

## 2020-08-11 MED ORDER — ONDANSETRON HCL 4 MG PO TABS: 4.0000 mg | ORAL_TABLET | Freq: Four times a day (QID) | ORAL | Status: DC | PRN

## 2020-08-11 MED ORDER — TAMSULOSIN HCL 0.4 MG PO CAPS
0.4000 mg | ORAL_CAPSULE | Freq: Every day | ORAL | Status: DC
  Administered 2020-08-11 – 2020-08-13 (×3): 0.4 mg via ORAL
  Filled 2020-08-11 (×3): qty 1

## 2020-08-11 MED ORDER — PANTOPRAZOLE SODIUM 40 MG IV SOLR
40.0000 mg | Freq: Two times a day (BID) | INTRAVENOUS | Status: DC
  Administered 2020-08-11 – 2020-08-14 (×6): 40 mg via INTRAVENOUS
  Filled 2020-08-11 (×6): qty 40

## 2020-08-11 MED ORDER — VITAMIN B-12 1000 MCG PO TABS
1000.0000 ug | ORAL_TABLET | Freq: Every day | ORAL | Status: DC
  Administered 2020-08-11 – 2020-08-14 (×4): 1000 ug via ORAL
  Filled 2020-08-11 (×4): qty 1

## 2020-08-11 MED ORDER — METOCLOPRAMIDE HCL 5 MG/ML IJ SOLN
5.0000 mg | Freq: Once | INTRAMUSCULAR | Status: AC
  Administered 2020-08-12: 5 mg via INTRAVENOUS
  Filled 2020-08-11: qty 2

## 2020-08-11 MED ORDER — TRAZODONE HCL 50 MG PO TABS: 25.0000 mg | ORAL_TABLET | Freq: Every evening | ORAL | Status: DC | PRN

## 2020-08-11 MED ORDER — AMLODIPINE BESYLATE 5 MG PO TABS
5.0000 mg | ORAL_TABLET | Freq: Every day | ORAL | Status: DC
  Administered 2020-08-11 – 2020-08-14 (×5): 5 mg via ORAL
  Filled 2020-08-11 (×4): qty 1

## 2020-08-11 MED ORDER — IOHEXOL 300 MG/ML  SOLN
100.0000 mL | Freq: Once | INTRAMUSCULAR | Status: AC | PRN
  Administered 2020-08-11: 100 mL via INTRAVENOUS

## 2020-08-11 MED ORDER — PROCHLORPERAZINE EDISYLATE 10 MG/2ML IJ SOLN
5.0000 mg | Freq: Once | INTRAMUSCULAR | Status: AC
  Administered 2020-08-11: 5 mg via INTRAVENOUS
  Filled 2020-08-11: qty 2

## 2020-08-11 MED ORDER — DOFETILIDE 500 MCG PO CAPS
500.0000 ug | ORAL_CAPSULE | Freq: Two times a day (BID) | ORAL | Status: DC
  Administered 2020-08-12 – 2020-08-14 (×5): 500 ug via ORAL
  Filled 2020-08-11 (×6): qty 1

## 2020-08-11 MED ORDER — LISINOPRIL 5 MG PO TABS
5.0000 mg | ORAL_TABLET | Freq: Every day | ORAL | Status: DC
  Administered 2020-08-12 – 2020-08-14 (×3): 5 mg via ORAL
  Filled 2020-08-11 (×3): qty 1

## 2020-08-11 MED ORDER — ACETAMINOPHEN 650 MG RE SUPP: 650.0000 mg | Freq: Four times a day (QID) | RECTAL | Status: DC | PRN

## 2020-08-11 MED ORDER — GABAPENTIN 300 MG PO CAPS
600.0000 mg | ORAL_CAPSULE | Freq: Two times a day (BID) | ORAL | Status: DC
  Administered 2020-08-12 – 2020-08-14 (×5): 600 mg via ORAL
  Filled 2020-08-11 (×6): qty 2

## 2020-08-11 MED ORDER — PANTOPRAZOLE SODIUM 40 MG IV SOLR
40.0000 mg | Freq: Every day | INTRAVENOUS | Status: DC
  Administered 2020-08-11: 40 mg via INTRAVENOUS
  Filled 2020-08-11: qty 40

## 2020-08-11 MED ORDER — ESCITALOPRAM OXALATE 10 MG PO TABS
10.0000 mg | ORAL_TABLET | Freq: Every day | ORAL | Status: DC
  Administered 2020-08-13 (×2): 10 mg via ORAL
  Filled 2020-08-11 (×3): qty 1

## 2020-08-11 MED ORDER — AMLODIPINE BESYLATE 5 MG PO TABS: 5.0000 mg | ORAL_TABLET | Freq: Every day | ORAL | Status: DC

## 2020-08-11 MED ORDER — ACETAMINOPHEN 325 MG PO TABS
650.0000 mg | ORAL_TABLET | Freq: Four times a day (QID) | ORAL | Status: DC | PRN
  Administered 2020-08-12 (×2): 650 mg via ORAL
  Filled 2020-08-11 (×2): qty 2

## 2020-08-11 MED ORDER — PRAVASTATIN SODIUM 40 MG PO TABS
40.0000 mg | ORAL_TABLET | Freq: Every day | ORAL | Status: DC
  Administered 2020-08-11 – 2020-08-13 (×3): 40 mg via ORAL
  Filled 2020-08-11 (×3): qty 1

## 2020-08-11 NOTE — Progress Notes (Incomplete)
[facility] night shift.  The nursing staff reported that the patient requested something different for nausea since ondansetron is not helping much.  Prochlorperazine 5 mg IVP x1 dose ordered.  Tennis Must, MD.  23:55 addendum

## 2020-08-11 NOTE — ED Notes (Signed)
Patient to CT at this time

## 2020-08-11 NOTE — Progress Notes (Addendum)
[facility] night shift.  The nursing staff reported that the patient requested something different for nausea since ondansetron is not helping much.  Prochlorperazine 5 mg IVP x1 dose ordered.  Tennis Must, MD.  2355 addendum: The patient is still having nausea after receiving prochlorperazine.  Metoclopramide 5 mg IVP x1 dose ordered.  Tennis Must, MD.  [phone] addendum: The patient's hemoglobin is 7.3 g/dL today.  He scheduled for an EGD later today with Dr. Laural Golden.  He would like the patient to receive 1 unit of PRBC prior to the procedure.  A single PRBC unit transfusion ordered.  Tennis Must, MD.

## 2020-08-11 NOTE — H&P (Signed)
History and Physical  [place] DJS:[id number] DOB: [birth year] DOA: 08/11/2020  PCP: Loman Brooklyn, FNP  Patient coming from: Home  Level of care: Telemetry  I have personally briefly reviewed patient's old medical records in [place]  Chief Complaint: rectal bleeding  HPI: Theodore Mendoza is a 80 y.o. male with medical history significant paroxysmal atrial fibrillation, diabetes, aortic aneurysm, history of GI bleeding status post EGD 12/18/2019 that showed a large ulcerated bleeding gastric polyp status post removal with hot snare and placement of hemostatic clips x2.  He is followed by vascular surgery Dr. Trula Slade who has suggested operative management if the aneurysm reached 5 cm.  He is on Plavix and apixaban which he has been on for quite a long time.  He had been seen by GI in February excruciating and they noted there was no plans for colonoscopy at that time.  He wants to continue ferrous sulfate daily and pantoprazole.  Patient presented to the PCP office on 08/09/2019 with complaints of weakness, fall and noted to be hypotensive at that time.  He presented to the emergency department today because he was concerned with ongoing weakness and low blood pressure and rectal bleeding.  He noted that he was told by his gastroenterology teams that he needed to seek medical care if he has any evidence of recurrent GI bleeding.  He reports generalized weakness.  He reports small amount of bright red rectal bleeding.  He denies chest pain and shortness of breath.  He does report that he is having intermittent dizziness.  He has had poor appetite.  No emesis.  No vision change.  ED Course: Hypotensive on arrival responded favorably to IV fluid hydration.  Hemoglobin noted to be down from 11 to 8 over a 53-month period.  He tested guaiac positive.  His high-sensitivity troponin tests 5.  CT abdomen and pelvis shows an infrarenal abdominal aortic aneurysm that measures 4.7 cm  no other acute findings.  Twelve-lead EKG with sinus rhythm.  CT head with findings of mild acute on chronic sinusitis, stable meningioma, no other acute findings.  Type and screen was requested and GI was consulted recommending admission for possible colonoscopy.  Review of Systems: Review of Systems  Constitutional: Positive for malaise/fatigue. Negative for chills, diaphoresis, fever and weight loss.  HENT: Negative.   Eyes: Negative.   Respiratory: Negative.   Cardiovascular: Negative.   Gastrointestinal: Positive for blood in stool. Negative for abdominal pain, constipation, diarrhea, heartburn, melena, nausea and vomiting.  Genitourinary: Negative.   Musculoskeletal: Negative.   Skin: Negative for itching.  Neurological: Positive for dizziness and weakness. Negative for seizures and loss of consciousness.  Endo/Heme/Allergies: Positive for environmental allergies. Bruises/bleeds easily.  Psychiatric/Behavioral: Negative.   All other systems reviewed and are negative.    Past Medical History:  Diagnosis Date  . Brain tumor ([place])    x2  . Cardiac arrhythmia   . CHF (congestive heart failure) ([place])   . Guillain-Barre syndrome ([place]) Feb 13 1986  . Hypertension   . Kidney stones   . Myocardial infarction [facility]) 2007    Past Surgical History:  Procedure Laterality Date  . CORONARY ANGIOPLASTY  1997   after mi  . CYSTOSCOPY WITH RETROGRADE PYELOGRAM, URETEROSCOPY AND STENT PLACEMENT Left 06/28/2014   Procedure: 1ST STAGE CYSTOSCOPY/URETEROSCOPY/STENT PLACEMENT;  Surgeon: Alexis Frock, MD;  Location: [facility];  Service: Urology;  Laterality: Left;  . CYSTOSCOPY WITH STENT PLACEMENT Left 05/08/2014  Procedure: CYSTOSCOPY, RETROGRADE PYELOGRAM WITH LEFT URETERAL STENT PLACEMENT;  Surgeon: Alexis Frock, MD;  Location: [facility];  Service: Urology;  Laterality: Left;  . ESOPHAGOGASTRODUODENOSCOPY (EGD) WITH PROPOFOL N/A 12/16/2019   Procedure: ESOPHAGOGASTRODUODENOSCOPY (EGD) WITH  PROPOFOL;  Surgeon: Mauri Pole, MD;  Location: [facility];  Service: Endoscopy;  Laterality: N/A;  . EYE SURGERY Right may 2015   growth removed, july 2015left eye cataract removed, right eye catarct removed  . gamma kniferadiation treatment  Feb 09 2014   [facility]  . HEMOSTASIS CLIP PLACEMENT  12/16/2019   Procedure: HEMOSTASIS CLIP PLACEMENT;  Surgeon: Mauri Pole, MD;  [facility];  Service: Endoscopy;;  . HEMOSTASIS CONTROL  12/16/2019   Procedure: HEMOSTASIS CONTROL;  Surgeon: Mauri Pole, MD;  Location: [facility];  Service: Endoscopy;;  Endoloop  . HOLMIUM LASER APPLICATION Left 1/32/4401   Procedure: HOLMIUM LASER APPLICATION;  Surgeon: Alexis Frock, MD;  Location: [facility];  Service: Urology;  Laterality: Left;  . LITHOTRIPSY  years ago  . POLYPECTOMY  12/16/2019   Procedure: POLYPECTOMY;  Surgeon: Mauri Pole, MD;  [facility];  Service: Endoscopy;;  . STONE EXTRACTION WITH BASKET Left 06/28/2014   Procedure: STONE EXTRACTION WITH BASKET;  Surgeon: Alexis Frock, MD;  Location: [facility];  Service: Urology;  Laterality: Left;     reports that he has never smoked. He has never used smokeless tobacco. He reports that he does not drink alcohol and does not use drugs.  Allergies  Allergen Reactions  . Hydrocodone-Acetaminophen   . Morphine And Related Other (See Comments)    Reaction:  Confusion/weakness/hallucinations  . Valsartan Rash    Family History  Problem Relation Age of Onset  . Diabetes Mother   . Lung cancer Mother   . CAD Father   . Diabetes Brother   . Diabetes Sister   . Stroke Sister   . Other Maternal Grandfather        brain tumor  . Colon cancer Neg Hx   . Pancreatic cancer Neg Hx   . Esophageal cancer Neg Hx     Prior to Admission medications   Medication Sig Start Date End Date Taking? Authorizing Provider  amLODipine (NORVASC) 5 MG tablet TAKE 1 TABLET BY MOUTH DAILY. Patient  taking differently: Take 5 mg by mouth daily. 03/21/20  Yes Dettinger, Fransisca Kaufmann, MD  apixaban (ELIQUIS) 5 MG TABS tablet Take 1 tablet (5 mg total) by mouth 2 (two) times daily. 06/06/20  Yes [name] F, FNP  clopidogrel (PLAVIX) 75 MG tablet TAKE 1 TABLET ONCE DAILY Patient taking differently: Take 75 mg by mouth daily. 08/03/20  Yes [name] F, FNP  dofetilide (TIKOSYN) 500 MCG capsule Take 1 capsule (500 mcg total) by mouth 2 (two) times daily. 06/06/20  Yes [name] F, FNP  escitalopram (LEXAPRO) 10 MG tablet TAKE 1 TABLET AT BEDTIME Patient taking differently: Take 10 mg by mouth at bedtime. 08/05/20  Yes [name] F, FNP  Ferrous Sulfate (IRON) 325 (65 Fe) MG TABS Take 1 tablet by mouth daily.    Yes [provider]  furosemide (LASIX) 20 MG tablet Take 1 tablet (20 mg total) by mouth daily. 05/06/20  Yes [name] F, FNP  gabapentin (NEURONTIN) 300 MG capsule TAKE 1 CAPSULE TWICE DAILY AND 2 AT BEDTIME Patient taking differently: Take 300 mg by mouth See admin instructions. 2 capsules in the day and 2 at bedtime 07/20/20  Yes Loman Brooklyn,  FNP  lisinopril (ZESTRIL) 5 MG tablet TAKE 1 TABLET ONCE DAILY Patient taking differently: Take 5 mg by mouth daily. 05/20/20  Yes [name] F, FNP  pantoprazole (PROTONIX) 40 MG tablet TAKE  (1)  TABLET TWICE A DAY. Patient taking differently: Take 40 mg by mouth 2 (two) times daily. 06/29/20  Yes [name] F, FNP  pravastatin (PRAVACHOL) 40 MG tablet TAKE 1 TABLET ONCE DAILY WITH LUNCH Patient taking differently: Take 40 mg by mouth daily. 04/04/20  Yes Hawks, Christy A, FNP  sucralfate (CARAFATE) 1 g tablet TAKE  (1)  TABLET  FOUR TIMES DAILY. Patient taking differently: Take 1 g by mouth 2 (two) times daily. 02/10/20  Yes [name] F, FNP  tamsulosin (FLOMAX) 0.4 MG CAPS capsule TAKE 1 CAPSULE ONCE DAILY Patient taking differently: Take 0.4 mg by mouth daily. 05/20/20  Yes [name] F, FNP  vitamin B-12  (CYANOCOBALAMIN) 1000 MCG tablet Take 1 tablet (1,000 mcg total) by mouth daily. 12/18/19  Yes Patrecia Pour, MD  metoprolol tartrate (LOPRESSOR) 25 MG tablet Take 0.5 tablets (12.5 mg total) by mouth daily. Patient not taking: No sig reported 05/06/20   Loman Brooklyn, FNP    Physical Exam: Vitals:   08/11/20 1300 08/11/20 1315 08/11/20 1330 08/11/20 1345  BP: (!) 139/58  129/87   Pulse: (!) 58 63 62 61  Resp: 16 15 14 16   Temp:      TempSrc:      SpO2: 100% 100% 99% 99%  Weight:      Height:       Constitutional: frail, elderly male, well spoken, alert, oriented x 3, NAD, calm, comfortable Eyes: PERRL, lids and conjunctivae normal ENMT: Mucous membranes are moist but pale. Posterior pharynx clear of any exudate or lesions.  Neck: normal, supple, no masses, no thyromegaly Respiratory: clear to auscultation bilaterally, no wheezing, no crackles. Normal respiratory effort. No accessory muscle use.  Cardiovascular: normal s1, s2 sounds, no murmurs / rubs / gallops. No extremity edema. 2+ pedal pulses. No carotid bruits.  Abdomen: no tenderness, no masses palpated. No hepatosplenomegaly. Bowel sounds positive.  Musculoskeletal: no clubbing / cyanosis. No joint deformity upper and lower extremities. Good ROM, no contractures. Normal muscle tone.  Skin: no rashes, lesions, ulcers. No induration Neurologic: CN 2-12 grossly intact. Sensation intact, DTR normal. Strength 5/5 in all 4.  Psychiatric: Normal judgment and insight. Alert and oriented x 3. Normal mood.   Labs on Admission: I have personally reviewed following labs and imaging studies  CBC: Recent Labs  Lab 08/11/20 0838  WBC 6.1  NEUTROABS 5.1  HGB 8.4*  HCT 27.2*  MCV 93.2  PLT [id number]   Basic Metabolic Panel: Recent Labs  Lab 08/11/20 0838  NA 143  K 4.3  CL 107  CO2 31  GLUCOSE 123*  BUN 17  CREATININE 0.78  CALCIUM 8.9   GFR: Estimated Creatinine Clearance: 69.7 mL/min (by C-G formula based on SCr of 0.78  mg/dL). Liver Function Tests: Recent Labs  Lab 08/11/20 0838  AST 12*  ALT 7  ALKPHOS 57  BILITOT 1.1  PROT 6.0*  ALBUMIN 3.4*   No results for input(s): LIPASE, AMYLASE in the last 168 hours. No results for input(s): AMMONIA in the last 168 hours. Coagulation Profile: No results for input(s): INR, PROTIME in the last 168 hours. Cardiac Enzymes: No results for input(s): CKTOTAL, CKMB, CKMBINDEX, TROPONINI in the last 168 hours. BNP (last 3 results) No results for input(s): PROBNP in  the last 8760 hours. HbA1C: No results for input(s): HGBA1C in the last 72 hours. CBG: No results for input(s): GLUCAP in the last 168 hours. Lipid Profile: No results for input(s): CHOL, HDL, LDLCALC, TRIG, CHOLHDL, LDLDIRECT in the last 72 hours. Thyroid Function Tests: No results for input(s): TSH, T4TOTAL, FREET4, T3FREE, THYROIDAB in the last 72 hours. Anemia Panel: No results for input(s): VITAMINB12, FOLATE, FERRITIN, TIBC, IRON, RETICCTPCT in the last 72 hours. Urine analysis:    Component Value Date/Time   COLORURINE YELLOW 11/10/2015 1500   APPEARANCEUR TURBID (A) 11/10/2015 1500   LABSPEC 1.017 11/10/2015 1500   PHURINE 8.5 (H) 11/10/2015 1500   GLUCOSEU NEGATIVE 11/10/2015 1500   HGBUR NEGATIVE 11/10/2015 1500   BILIRUBINUR NEGATIVE 11/10/2015 1500   KETONESUR NEGATIVE 11/10/2015 1500   PROTEINUR NEGATIVE 11/10/2015 1500   UROBILINOGEN 1.0 05/07/2014 1838   NITRITE POSITIVE (A) 11/10/2015 1500   LEUKOCYTESUR MODERATE (A) 11/10/2015 1500    Radiological Exams on Admission: CT Head Wo Contrast  Result Date: 08/11/2020 CLINICAL DATA:  Intermittent dizziness.  Recent fall. EXAM: CT HEAD WITHOUT CONTRAST TECHNIQUE: Contiguous axial images were obtained from the base of the skull through the vertex without intravenous contrast. COMPARISON:  10/01/2015 FINDINGS: Brain: Again noted is the partially calcified extra-axial mass in the CP angle on the left most compatible with meningioma,  measuring 3 cm, stable since prior study. Mass effect on the adjacent left cerebellar hemisphere. No midline shift. No acute infarct, hemorrhage or hydrocephalus. Mild chronic small vessel disease throughout the deep white matter. Vascular: No hyperdense vessel or unexpected calcification Skull: No calvarial abnormality. Sinuses/Orbits: Short air-fluid level in the left sphenoid sinus, new since prior study. Scattered mucosal thickening in the ethmoid air cells. Other: None IMPRESSION: No acute intracranial abnormality. Stable left CP angle meningioma. Mild acute on chronic sinusitis. Electronically Signed   By: Rolm Baptise M.D.   On: 08/11/2020 09:52   CT ABDOMEN PELVIS W CONTRAST  Result Date: 08/11/2020 CLINICAL DATA:  Melena, hematuria EXAM: CT ABDOMEN AND PELVIS WITH CONTRAST TECHNIQUE: Multidetector CT imaging of the abdomen and pelvis was performed using the standard protocol following bolus administration of intravenous contrast. CONTRAST:  176mL OMNIPAQUE IOHEXOL 300 MG/ML  SOLN COMPARISON:  03/17/2020 FINDINGS: Lower chest: No acute abnormality. Hepatobiliary: No focal hepatic abnormality. Gallbladder unremarkable. Pancreas: Pancreatic atrophy. No focal abnormality or ductal dilatation. Spleen: No focal abnormality.  Normal size. Adrenals/Urinary Tract: Right renal parapelvic cysts. No hydronephrosis. Complex cyst in the lower pole of the right kidney measures 2.6 cm with peripheral calcifications, stable since prior study. No renal or ureteral stones. Urinary bladder decompressed, grossly unremarkable. Stomach/Bowel: Scattered colonic diverticulosis, most pronounced in the right colon. No active diverticulitis. Stomach and small bowel decompressed, unremarkable. No bowel obstruction. Vascular/Lymphatic: Heavily calcified aorta and iliac vessels. 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 Multiple penetrating ulcers are again noted in the abdominal aorta. No significant change in the  appearance since prior study. No adenopathy. Reproductive: Mildly prominent prostate. Other: No free fluid or free air. Musculoskeletal: No acute bony abnormality. IMPRESSION: Colonic diverticulosis.  No active diverticulitis. Right renal cysts, stable.  No stones or hydronephrosis. Multiple penetrating ulcers in the abdominal aorta with pseudoaneurysm in the infrarenal abdominal aorta measuring up to 4.7 cm. Overall appearance is stable since prior study. Prostate enlargement. Electronically Signed   By: Rolm Baptise M.D.   On: 08/11/2020 12:53   DG Chest Port 1 View  Result Date: 08/11/2020 CLINICAL DATA:  Weakness EXAM:  PORTABLE CHEST 1 VIEW COMPARISON:  October 02, 2015 chest radiograph; chest CT March 17, 2020 FINDINGS: Lungs are clear. Heart size and pulmonary vascularity are normal. No adenopathy. There is aortic atherosclerosis. No bone lesions. IMPRESSION: Lungs clear. Heart size normal. Aortic Atherosclerosis (ICD10-I70.0). Electronically Signed   By: Lowella Grip III M.D.   On: 08/11/2020 08:54    EKG: Independently reviewed. NSR   Assessment/Plan Principal Problem:   Rectal bleeding Active Problems:   Essential (primary) hypertension   Hyperlipidemia   History of TIA (transient ischemic attack)   Idiopathic peripheral neuropathy   Late effects of CVA (cerebrovascular accident)   Meningioma (HCC)   Paraplegia, incomplete (HCC)   Atrial fibrillation (HCC)   Symptomatic anemia   Abdominal aortic aneurysm (AAA) without rupture ([place])   History of non-ST elevation myocardial infarction (NSTEMI)   Long term current use of anticoagulant   Acute blood loss anemia   Acute GI bleed - Pt does have guaiac positive stool and evidence of acute blood loss anemia.  GI has been consulted and planning for further evaluation.  Clear liquid diet ordered.  Monitoring hemoglobin closely.  Hold all anticoagulants at this time.  Type and screen.  History of ulcerated bleeding gastric polyp - this  was treated by GI in [place] back in late 2021 and had been doing well up until this time.    Paroxysmal atrial fibrillation- holding home apixaban due to active GI bleeding.  Infrarenal abdominal aortic aneurysm-followed closely by Dr. Trula Slade with vascular surgery, reportedly operative intervention was recommended if aneurysm reaches 5 cm and is now 4.7 cm.  CAD s/p MI - plavix on hold now due to GI bleeding.   Cerebrovascular disease - plavix is being used for secondary prevention but currently on hold due to GI bleeding.    DVT prophylaxis: SCD  Code Status: Full   Family Communication: bedside update brother   Disposition Plan: TBD  Consults called: GI   Admission status: INP  Level of care: Telemetry Irwin Brakeman MD [facility] How to contact the [facility] Attending or Consulting provider [place] or covering provider during after hours [place], for this patient?  1. Check the care team in [facility] and look for a) attending/consulting [facility] provider listed and b) the [facility] team listed 2. Log into [email or web address] and use 's universal password to access. If you do not have the password, please contact the hospital operator. 3. Locate the [facility] provider you are looking for under [facility] and page to a number that you can be directly reached. 4. If you still have difficulty reaching the provider, please page the [facility] (Director on Call) for the Hospitalists listed on amion for assistance.   If 7PM-7AM, please contact night-coverage [email or web address] Password [facility]  08/11/2020, 3:17 PM

## 2020-08-11 NOTE — ED Triage Notes (Signed)
Patient reports that DBP has been in the 40's recently. Concerned because he is having intermittent dizziness that he believes is related to blood pressure.

## 2020-08-11 NOTE — Consult Note (Addendum)
Referring Provider: Murlean Iba, MD Primary Care Physician:  Loman Brooklyn, FNP Primary Gastroenterologist:  Dr. Jenny Reichmann [facility]  Reason for Consultation:  Rectal bleeding  HPI: Theodore Mendoza is a 80 y.o. 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  Patient is followed by vascular surgery, Dr. Trula Slade who suggested operative management of his aneurysm when it reached 5 cm.  Patient hospitalized in September 2021 with normocytic anemia, heme positive stool, melena in the setting of Eliquis and Plavix. Felt to be bleeding from a large friable ulcerated gastric polyp which was resected.  See below for EGD findings and recommendations.  Placed on PPI twice daily.  Colonoscopy was not pursued at the time due to significant enlargement of infrarenal abdominal aorta with multiple areas of ulcerated plaque and penetrating ulcer and concern for risk of procedure.  Patient seen recently in follow-up at [facility] in February.  PPI decreased to once daily.  In February his hemoglobin was 11.6.  Today: Patient came to emergency department because of overall weakness, low blood pressure, rectal bleeding.  Has been feeling weak and lightheaded.  [place] on May 1.  States his legs bent behind him.  He saw his PCP who prescribed Voltaren gel.  He was noted to have low blood pressures at that time and advised to hold his blood pressure medication to see if it would resolve.  Patient decided because of progressive weakness, increased rectal bleeding he will come to the emergency department.  He has had some blood per rectum off and on but over the past 2 days felt like it increased.  Noted  predominantly on the toilet tissue, dark red blood.  Denies blood clots.  A small amount of blood seen in the toilet.  Stools have been black and tarry or dark brown.  On iron daily. No constipation or diarrhea.  Denies rectal pain.  No abdominal pain.  Had some nausea this morning.  No vomiting.  No heartburn.  No dysphagia.  Denies chest pain or shortness of breath.  His last dose of Eliquis was at 8 AM on 5/12 (this morning).  He takes Plavix daily.  Denies aspirin or NSAID use.  In the ED today, hemoglobin 8.4 (down from 11.6 in February), hematocrit 27.2, MCV 93.2, platelets 250,000.  Hemoccult positive stool.  Sodium 143, BUN 17, creatinine 0.78, albumin 3.4, LFTs normal.  Troponin I 5.  CT abdomen pelvis with contrast: IMPRESSION: -Colonic diverticulosis.  No active diverticulitis. -Right renal cysts, stable.  No stones or hydronephrosis. -Multiple penetrating ulcers in the abdominal aorta with pseudoaneurysm in the infrarenal abdominal aorta measuring up to 4.7cm. Overall appearance is stable since prior study. -Prostate enlargement. -Pancreatic atrophy  Head CT without contrast: IMPRESSION: -No acute intracranial abnormality. -Stable left CP angle meningioma. -Mild acute on chronic sinusitis.  EGD September 2021:  -Z-line regular, 35 cm from incisors -Gastroesophageal flap valve classified as Hill grade III (minimal fold, loose to endoscope, hiatal hernia likely). -A single 20 mm friable ulcerated gastric polyp with bleeding and stigmata of recent bleeding.  Resected and retrieved.  Clips (MR conditional) were placed. -Normal examined duodenum. -Case was discussed with IR (Dr. Laurence Ferrari), he did not recommend prophylactic embolization of the left gastric.  It  would be difficult for IR to intervene given large aortic aneurysm with?  Contained partial dissection.  Colonoscopy: Per patient more than 10 years ago and unremarkable.  Prior to Admission medications   Medication Sig  Start Date End Date Taking? Authorizing Provider  amLODipine (NORVASC) 5 MG tablet TAKE 1 TABLET BY MOUTH DAILY. Patient taking differently: Take 5 mg by mouth daily. 03/21/20  Yes Dettinger, Fransisca Kaufmann, MD  apixaban (ELIQUIS) 5 MG TABS tablet Take 1 tablet (5 mg total) by mouth 2 (two) times daily. 06/06/20  Yes [name] F, FNP  clopidogrel (PLAVIX) 75 MG tablet TAKE 1 TABLET ONCE DAILY Patient taking differently: Take 75 mg by mouth daily. 08/03/20  Yes [name] F, FNP  dofetilide (TIKOSYN) 500 MCG capsule Take 1 capsule (500 mcg total) by mouth 2 (two) times daily. 06/06/20  Yes [name] F, FNP  escitalopram (LEXAPRO) 10 MG tablet TAKE 1 TABLET AT BEDTIME Patient taking differently: Take 10 mg by mouth at bedtime. 08/05/20  Yes [name] F, FNP  Ferrous Sulfate (IRON) 325 (65 Fe) MG TABS Take 1 tablet by mouth daily.    Yes [provider]  furosemide (LASIX) 20 MG tablet Take 1 tablet (20 mg total) by mouth daily. 05/06/20  Yes [name] F, FNP  gabapentin (NEURONTIN) 300 MG capsule TAKE 1 CAPSULE TWICE DAILY AND 2 AT BEDTIME Patient taking differently: Take 300 mg by mouth See admin instructions. 2 capsules in the day and 2 at bedtime 07/20/20  Yes [name] F, FNP  lisinopril (ZESTRIL) 5 MG tablet TAKE 1 TABLET ONCE DAILY Patient taking differently: Take 5 mg by mouth daily. 05/20/20  Yes [name] F, FNP  pantoprazole (PROTONIX) 40 MG tablet TAKE  (1)  TABLET TWICE A DAY. Patient taking differently: Take 40 mg by mouth 2 (two) times daily. 06/29/20  Yes [name] F, FNP  pravastatin (PRAVACHOL) 40 MG tablet TAKE 1 TABLET ONCE DAILY WITH LUNCH Patient taking differently: Take 40 mg by mouth daily. 04/04/20  Yes Hawks, Christy A, FNP  sucralfate (CARAFATE) 1 g tablet TAKE  (1)  TABLET  FOUR TIMES DAILY. Patient taking differently: Take 1 g by mouth 2 (two) times daily. 02/10/20  Yes [name] F, FNP  tamsulosin (FLOMAX) 0.4 MG CAPS capsule TAKE 1  CAPSULE ONCE DAILY Patient taking differently: Take 0.4 mg by mouth daily. 05/20/20  Yes [name] F, FNP  vitamin B-12 (CYANOCOBALAMIN) 1000 MCG tablet Take 1 tablet (1,000 mcg total) by mouth daily. 12/18/19  Yes Patrecia Pour, MD  metoprolol tartrate (LOPRESSOR) 25 MG tablet Take 0.5 tablets (12.5 mg total) by mouth daily. Patient not taking: No sig reported 05/06/20   Loman Brooklyn, FNP    Current Facility-Administered Medications  Medication Dose Route Frequency Provider Last Rate Last Admin  . 0.9 %  sodium chloride infusion   Intravenous Continuous Wynetta Emery, Clanford L, MD 45 mL/hr at 08/11/20 1422 New Bag at 08/11/20 1422  . acetaminophen (TYLENOL) tablet 650 mg  650 mg Oral Q6H PRN Johnson, Clanford L, MD       Or  . acetaminophen (TYLENOL) suppository 650 mg  650 mg Rectal Q6H PRN Johnson, Clanford L, MD      . amLODipine (NORVASC) tablet 5 mg  5 mg Oral Daily Johnson, Clanford L, MD      . dofetilide (TIKOSYN) capsule 500 mcg  500 mcg Oral BID Johnson, Clanford L, MD      . escitalopram (LEXAPRO) tablet  10 mg  10 mg Oral QHS Johnson, Clanford L, MD      . ferrous sulfate tablet 325 mg  325 mg Oral Daily Johnson, Clanford L, MD      . Derrill Memo ON 08/12/2020] furosemide (LASIX) tablet 20 mg  20 mg Oral Daily Johnson, Clanford L, MD      . gabapentin (NEURONTIN) capsule 600 mg  600 mg Oral BID Johnson, Clanford L, MD      . hydrALAZINE (APRESOLINE) injection 5 mg  5 mg Intravenous Q8H PRN Johnson, Clanford L, MD      . Derrill Memo ON 08/12/2020] lisinopril (ZESTRIL) tablet 5 mg  5 mg Oral Daily Johnson, Clanford L, MD      . ondansetron (ZOFRAN) tablet 4 mg  4 mg Oral Q6H PRN Johnson, Clanford L, MD       Or  . ondansetron (ZOFRAN) injection 4 mg  4 mg Intravenous Q6H PRN Johnson, Clanford L, MD      . pantoprazole (PROTONIX) injection 40 mg  40 mg Intravenous Daily Johnson, Clanford L, MD   40 mg at 08/11/20 1423  . pravastatin (PRAVACHOL) tablet 40 mg  40 mg Oral q1800 Johnson, Clanford  L, MD      . tamsulosin (FLOMAX) capsule 0.4 mg  0.4 mg Oral QPC supper Johnson, Clanford L, MD      . traZODone (DESYREL) tablet 25 mg  25 mg Oral QHS PRN Johnson, Clanford L, MD      . vitamin B-12 (CYANOCOBALAMIN) tablet 1,000 mcg  1,000 mcg Oral Daily Johnson, Clanford L, MD        Allergies as of 08/11/2020 - Review Complete 08/11/2020  Allergen Reaction Noted  . Hydrocodone-acetaminophen  05/19/2019  . Morphine and related Other (See Comments) 05/07/2014  . Valsartan Rash 05/07/2014    Past Medical History:  Diagnosis Date  . Brain tumor ([place])    x2  . Cardiac arrhythmia   . CHF (congestive heart failure) ([place])   . Guillain-Barre syndrome ([place]) Feb 13 1986  . Hypertension   . Kidney stones   . Myocardial infarction [facility] 2007    Past Surgical History:  Procedure Laterality Date  . CORONARY ANGIOPLASTY  1997   after mi  . CYSTOSCOPY WITH RETROGRADE PYELOGRAM, URETEROSCOPY AND STENT PLACEMENT Left 06/28/2014   Procedure: 1ST STAGE CYSTOSCOPY/URETEROSCOPY/STENT PLACEMENT;  Surgeon: Alexis Frock, MD;  Location: [facility];  Service: Urology;  Laterality: Left;  . CYSTOSCOPY WITH STENT PLACEMENT Left 05/08/2014   Procedure: CYSTOSCOPY, RETROGRADE PYELOGRAM WITH LEFT URETERAL STENT PLACEMENT;  Surgeon: Alexis Frock, MD;  Location: [facility];  Service: Urology;  Laterality: Left;  . ESOPHAGOGASTRODUODENOSCOPY (EGD) WITH PROPOFOL N/A 12/16/2019   Procedure: ESOPHAGOGASTRODUODENOSCOPY (EGD) WITH PROPOFOL;  Surgeon: Mauri Pole, MD;  Location: [facility];  Service: Endoscopy;  Laterality: N/A;  . EYE SURGERY Right may 2015   growth removed, july 2015left eye cataract removed, right eye catarct removed  . gamma kniferadiation treatment  Feb 09 2014   [facility] for [facility]  . HEMOSTASIS CLIP PLACEMENT  12/16/2019   Procedure: HEMOSTASIS CLIP PLACEMENT;  Surgeon: Mauri Pole, MD;  Location: [facility];  Service: Endoscopy;;  . HEMOSTASIS CONTROL  12/16/2019    Procedure: HEMOSTASIS CONTROL;  Surgeon: Mauri Pole, MD;  Location: [facility];  Service: Endoscopy;;  Endoloop  . HOLMIUM LASER APPLICATION Left 07/17/3843   Procedure: HOLMIUM LASER APPLICATION;  Surgeon: Alexis Frock, MD;  Location: [facility];  Service: Urology;  Laterality: Left;  . LITHOTRIPSY  years ago  . POLYPECTOMY  12/16/2019   Procedure: POLYPECTOMY;  Surgeon: Mauri Pole, MD;  Location: [facility];  Service: Endoscopy;;  . STONE EXTRACTION WITH BASKET Left 06/28/2014   Procedure: STONE EXTRACTION WITH BASKET;  Surgeon: Alexis Frock, MD;  Location: [facility];  Service: Urology;  Laterality: Left;    Family History  Problem Relation Age of Onset  . Diabetes Mother   . Lung cancer Mother   . CAD Father   . Diabetes Brother   . Diabetes Sister   . Stroke Sister   . Other Maternal Grandfather        brain tumor  . Colon cancer Neg Hx   . Pancreatic cancer Neg Hx   . Esophageal cancer Neg Hx     Social History   Socioeconomic History  . Marital status: Married    Spouse name: shirley   . Number of children: 1  . Years of education: Not on file  . Highest education level: Not on file  Occupational History  . Occupation: retired   Tobacco Use  . Smoking status: Never Smoker  . Smokeless tobacco: Never Used  Vaping Use  . Vaping Use: Never used  Substance and Sexual Activity  . Alcohol use: No  . Drug use: No  . Sexual activity: Not Currently  Other Topics Concern  . Not on file  Social History Narrative   Lives with wife    Social Determinants of Health   Financial Resource Strain: Not on file  Food Insecurity: Not on file  Transportation Needs: Not on file  Physical Activity: Not on file  Stress: Not on file  Social Connections: Not on file  Intimate Partner Violence: Not on file     ROS:  General: Negative for  weight loss, fever, chills, fatigue.  See HPI Eyes: Negative for vision changes.  ENT: Negative for hoarseness, difficulty  swallowing , nasal congestion. CV: Negative for chest pain, angina, palpitations, dyspnea on exertion, positive peripheral edema.  Respiratory: Negative for dyspnea at rest, dyspnea on exertion, cough, sputum, wheezing.  GI: See history of present illness. GU:  Negative for dysuria, hematuria, urinary incontinence, urinary frequency, nocturnal urination.  MS: Pain in both thighs, especially in the left.  Negative for low back pain.  Derm: Negative for rash or itching.  Neuro: Negative for weakness, abnormal sensation, seizure, frequent headaches, memory loss, confusion.  Psych: Negative for anxiety, depression, suicidal ideation, hallucinations.  Endo: Negative for unusual weight change.  Heme: Negative for bruising or bleeding. Allergy: Negative for rash or hives.       Physical Examination: Vital signs in last 24 hours: Temp:  [98 F (36.7 C)-98.6 F (37 C)] 98.4 F (36.9 C) (05/12 1548) Pulse Rate:  [58-81] 73 (05/12 1548) Resp:  [10-24] 16 (05/12 1548) BP: (120-170)/(58-134) 138/69 (05/12 1548) SpO2:  [97 %-100 %] 100 % (05/12 1548) Weight:  [68 kg] 68 kg (05/12 0809)    General: Elderly thin male in no acute distress.    Head: Normocephalic, atraumatic.   Eyes: Conjunctiva pale, no icterus. Mouth: Oropharyngeal mucosa moist and pink , no lesions erythema or exudate. Neck: Supple without thyromegaly, masses, or lymphadenopathy.  Lungs: Clear to auscultation bilaterally.  Heart: Regular rate and rhythm, no murmurs rubs or gallops.  Abdomen: Bowel sounds are normal, nontender, nondistended, no hepatosplenomegaly or masses, no hernia , no rebound or guarding. Palpable mass above the umbilicus. Rectal: Not performed Extremities: Pitting edema bilateral lower extremities to calves.  Bruising/swelling left thigh.  Neuro: Alert and oriented x 4 , grossly normal neurologically.  Skin: Warm and dry, no rash or jaundice.   Psych: Alert and cooperative, normal mood and affect.         Intake/Output from previous day: No intake/output data recorded. Intake/Output this shift: Total I/O In: 500 [IV Piggyback:500] Out: -   Lab Results: CBC Recent Labs    08/11/20 0838  WBC 6.1  HGB 8.4*  HCT 27.2*  MCV 93.2  PLT 250   BMET Recent Labs    08/11/20 0838  NA 143  K 4.3  CL 107  CO2 31  GLUCOSE 123*  BUN 17  CREATININE 0.78  CALCIUM 8.9   LFT Recent Labs    08/11/20 0838  BILITOT 1.1  ALKPHOS 57  AST 12*  ALT 7  PROT 6.0*  ALBUMIN 3.4*    Lipase No results for input(s): LIPASE in the last 72 hours.  PT/INR No results for input(s): LABPROT, INR in the last 72 hours.    Imaging Studies: CT Head Wo Contrast  Result Date: 08/11/2020 CLINICAL DATA:  Intermittent dizziness.  Recent fall. EXAM: CT HEAD WITHOUT CONTRAST TECHNIQUE: Contiguous axial images were obtained from the base of the skull through the vertex without intravenous contrast. COMPARISON:  10/01/2015 FINDINGS: Brain: Again noted is the partially calcified extra-axial mass in the CP angle on the left most compatible with meningioma, measuring 3 cm, stable since prior study. Mass effect on the adjacent left cerebellar hemisphere. No midline shift. No acute infarct, hemorrhage or hydrocephalus. Mild chronic small vessel disease throughout the deep white matter. Vascular: No hyperdense vessel or unexpected calcification Skull: No calvarial abnormality. Sinuses/Orbits: Short air-fluid level in the left sphenoid sinus, new since prior study. Scattered mucosal thickening in the ethmoid air cells. Other: None IMPRESSION: No acute intracranial abnormality. Stable left CP angle meningioma. Mild acute on chronic sinusitis. Electronically Signed   By: Rolm Baptise M.D.   On: 08/11/2020 09:52   CT ABDOMEN PELVIS W CONTRAST  Result Date: 08/11/2020 CLINICAL DATA:  Melena, hematuria EXAM: CT ABDOMEN AND PELVIS WITH CONTRAST TECHNIQUE: Multidetector CT imaging of the abdomen and pelvis was performed  using the standard protocol following bolus administration of intravenous contrast. CONTRAST:  141mL OMNIPAQUE IOHEXOL 300 MG/ML  SOLN COMPARISON:  03/17/2020 FINDINGS: Lower chest: No acute abnormality. Hepatobiliary: No focal hepatic abnormality. Gallbladder unremarkable. Pancreas: Pancreatic atrophy. No focal abnormality or ductal dilatation. Spleen: No focal abnormality.  Normal size. Adrenals/Urinary Tract: Right renal parapelvic cysts. No hydronephrosis. Complex cyst in the lower pole of the right kidney measures 2.6 cm with peripheral calcifications, stable since prior study. No renal or ureteral stones. Urinary bladder decompressed, grossly unremarkable. Stomach/Bowel: Scattered colonic diverticulosis, most pronounced in the right colon. No active diverticulitis. Stomach and small bowel decompressed, unremarkable. No bowel obstruction. Vascular/Lymphatic: Heavily calcified aorta and iliac vessels. 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 Multiple penetrating ulcers are again noted in the abdominal aorta. No significant change in the appearance since prior study. No adenopathy. Reproductive: Mildly prominent prostate. Other: No free fluid or free air. Musculoskeletal: No acute bony abnormality. IMPRESSION: Colonic diverticulosis.  No active diverticulitis. Right renal cysts, stable.  No stones or hydronephrosis. Multiple penetrating ulcers in the abdominal aorta with pseudoaneurysm in the infrarenal abdominal aorta measuring up to 4.7 cm. Overall appearance is stable since prior study. Prostate enlargement. Electronically Signed   By: Rolm Baptise M.D.   On: 08/11/2020 12:53   DG Chest  Port 1 View  Result Date: 08/11/2020 CLINICAL DATA:  Weakness EXAM: PORTABLE CHEST 1 VIEW COMPARISON:  October 02, 2015 chest radiograph; chest CT March 17, 2020 FINDINGS: Lungs are clear. Heart size and pulmonary vascularity are normal. No adenopathy. There is aortic atherosclerosis. No bone lesions.  IMPRESSION: Lungs clear. Heart size normal. Aortic Atherosclerosis (ICD10-I70.0). Electronically Signed   By: Lowella Grip III M.D.   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 Patient has history of large bleeding gastric polyp removed at time of EGD 12/2019 when he presented with bleeding. Remote colonoscopy.  GI bleeding: Patient presents with intermittent bleeding, describes dark blood per rectum and possible melena increased over the last 2 days.  Hemoglobin 11.6 in February 2022.  Presenting hemoglobin of 8.4.  Heme positive stool.  Patient complaining of increased weakness, dizziness, hypotension.  Denies abdominal pain, back pain.  Had some nausea this morning. Last dose of Eliquis at 8 AM this morning.  CT abdomen and pelvis as outlined above with stable 4.7 cm infrarenal abdominal aortic aneurysm with multiple penetrating ulcers.  Fall: Mechanical fall 07/31/20. Legs folded underneath him. Large area of swelling/bruising especially left thigh suspected hematoma. Likely does not completely explain drop in his H/H.  Patient wears leg braces s/p severe GBS years ago. No imaging performed of lower extremities.  Plan: 1. Hold Plavix and Eliquis. For now. 2. PPI IV. 3. Monitor H/H closely. Will update now. Transfuse if needed.  4. Patient may benefit from repeat EGD. Colonoscopy has been postponed by [facility] due to concern regarding large AAA. May require colonoscopy based on clinical course.   We would like to thank you for the opportunity to participate in the care of Theodore Mendoza.  Laureen Ochs. Bernarda Caffey [facility]  [facility] [phone] 5/12/20224:20 PM     LOS: 0 days

## 2020-08-12 ENCOUNTER — Encounter (HOSPITAL_COMMUNITY): Payer: Self-pay | Admitting: Anesthesiology

## 2020-08-12 ENCOUNTER — Inpatient Hospital Stay (HOSPITAL_COMMUNITY): Payer: Medicare Other

## 2020-08-12 ENCOUNTER — Encounter (HOSPITAL_COMMUNITY)
Admission: EM | Disposition: A | Discharge status: Home / Self Care | Payer: Self-pay | Source: Home / Self Care | Attending: Family Medicine

## 2020-08-12 DIAGNOSIS — G610 Guillain-Barre syndrome: Secondary | ICD-10-CM | POA: Diagnosis present

## 2020-08-12 DIAGNOSIS — N390 Urinary tract infection, site not specified: Secondary | ICD-10-CM | POA: Diagnosis present

## 2020-08-12 DIAGNOSIS — K921 Melena: Secondary | ICD-10-CM

## 2020-08-12 LAB — URINALYSIS, ROUTINE W REFLEX MICROSCOPIC
Bilirubin Urine: NEGATIVE
Glucose, UA: NEGATIVE mg/dL
Ketones, ur: 20 mg/dL — AB
Nitrite: POSITIVE — AB
Protein, ur: 100 mg/dL — AB
RBC / HPF: >50 RBC/hpf — ABNORMAL HIGH (ref 0–5)
Specific Gravity, Urine: 1.024 (ref 1.005–1.030)
WBC, UA: >50 WBC/hpf — ABNORMAL HIGH (ref 0–5)
pH: 6 (ref 5.0–8.0)

## 2020-08-12 LAB — ABO/RH: ABO/RH(D): O NEG

## 2020-08-12 LAB — CBC
HCT: 24.8 % — ABNORMAL LOW (ref 39.0–52.0)
Hemoglobin: 7.3 g/dL — ABNORMAL LOW (ref 13.0–17.0)
MCH: 28.1 pg (ref 26.0–34.0)
MCHC: 29.4 g/dL — ABNORMAL LOW (ref 30.0–36.0)
MCV: 95.4 fL (ref 80.0–100.0)
Platelets: 236 10*3/uL (ref 150–400)
RBC: 2.6 MIL/uL — ABNORMAL LOW (ref 4.22–5.81)
RDW: 14.5 % (ref 11.5–15.5)
WBC: 13.2 10*3/uL — ABNORMAL HIGH (ref 4.0–10.5)
nRBC: 0 % (ref 0.0–0.2)

## 2020-08-12 LAB — COMPREHENSIVE METABOLIC PANEL
ALT: 7 U/L (ref 0–44)
AST: 11 U/L — ABNORMAL LOW (ref 15–41)
Albumin: 3.1 g/dL — ABNORMAL LOW (ref 3.5–5.0)
Alkaline Phosphatase: 50 U/L (ref 38–126)
Anion gap: 8 (ref 5–15)
BUN: 17 mg/dL (ref 8–23)
CO2: 27 mmol/L (ref 22–32)
Calcium: 8.3 mg/dL — ABNORMAL LOW (ref 8.9–10.3)
Chloride: 107 mmol/L (ref 98–111)
Creatinine, Ser: 0.81 mg/dL (ref 0.61–1.24)
GFR, Estimated: >60 mL/min (ref >60)
Glucose, Bld: 102 mg/dL — ABNORMAL HIGH (ref 70–99)
Potassium: 3.7 mmol/L (ref 3.5–5.1)
Sodium: 142 mmol/L (ref 135–145)
Total Bilirubin: 1.2 mg/dL (ref 0.3–1.2)
Total Protein: 5.3 g/dL — ABNORMAL LOW (ref 6.5–8.1)

## 2020-08-12 LAB — MAGNESIUM: Magnesium: 1.9 mg/dL (ref 1.7–2.4)

## 2020-08-12 LAB — PREPARE RBC (CROSSMATCH)

## 2020-08-12 SURGERY — ESOPHAGOGASTRODUODENOSCOPY (EGD) WITH PROPOFOL
Anesthesia: Choice

## 2020-08-12 MED ORDER — SODIUM CHLORIDE 0.9% IV SOLUTION: Freq: Once | INTRAVENOUS | Status: AC

## 2020-08-12 MED ORDER — SODIUM CHLORIDE 0.9% IV SOLUTION: Freq: Once | INTRAVENOUS | Status: DC

## 2020-08-12 MED ORDER — POTASSIUM CHLORIDE CRYS ER 20 MEQ PO TBCR
20.0000 meq | EXTENDED_RELEASE_TABLET | Freq: Every day | ORAL | Status: DC
  Administered 2020-08-12: 20 meq via ORAL
  Filled 2020-08-12: qty 1

## 2020-08-12 MED ORDER — FUROSEMIDE 10 MG/ML IJ SOLN: 40.0000 mg | Freq: Once | INTRAMUSCULAR | Status: DC

## 2020-08-12 MED ORDER — SODIUM CHLORIDE 0.9 % IV SOLN: INTRAVENOUS | Status: AC

## 2020-08-12 MED ORDER — SODIUM CHLORIDE 0.9 % IV SOLN
1.0000 g | INTRAVENOUS | Status: DC
  Administered 2020-08-12 – 2020-08-13 (×2): 1 g via INTRAVENOUS
  Filled 2020-08-12 (×2): qty 10

## 2020-08-12 NOTE — Anesthesia Preprocedure Evaluation (Addendum)
Anesthesia Evaluation  Patient identified by MRN, date of birth, ID band Patient awake    Reviewed: Allergy & Precautions, NPO status , Patient's Chart, lab work & pertinent test results  Airway Mallampati: II  TM Distance: >3 FB Neck ROM: Full   Comment: H/o tracheostomy  Dental  (+) Dental Advisory Given, Missing   Pulmonary  Tracheostomy after GBS  H/o tracheostomy Pulmonary exam normal breath sounds clear to auscultation       Cardiovascular Exercise Tolerance: Poor METS (Paraplegic): hypertension, Pt. on medications + Past MI and +CHF  Normal cardiovascular exam+ dysrhythmias Atrial Fibrillation  Rhythm:Regular Rate:Normal  11-Aug-2020 08:10:34 [facility] ROUTINE RECORD [id number] (47 yr) Male Caucasian Room:ER2 Loc:499 Technician: VSP Test ind: Comment 1:: Comment 2:: Comment 3:: Comment 4:: Vent. rate 84 BPM PR interval 173 ms QRS duration 99 ms QT/QTcB 387/458 ms P-R-T axes 74 18 52 Sinus rhythm No significant change since last tracing Confirmed by Lacretia Leigh ([id number]) on 08/12/2020 10:50:07 AM   Neuro/Psych TIA Neuromuscular disease (Gullian Barre Syndrome, incomplete paraplegia) negative psych ROS   GI/Hepatic negative GI ROS, Neg liver ROS,   Endo/Other  negative endocrine ROS  Renal/GU Renal disease     Musculoskeletal negative musculoskeletal ROS (+)   Abdominal   Peds  Hematology  (+) anemia ,   Anesthesia Other Findings   Reproductive/Obstetrics negative OB ROS                            Anesthesia Physical Anesthesia Plan  ASA: IV  Anesthesia Plan: General   Post-op Pain Management:    Induction: Intravenous  PONV Risk Score and Plan: Propofol infusion  Airway Management Planned: Nasal Cannula and Natural Airway  Additional Equipment:   Intra-op Plan:   Post-operative Plan:   Informed Consent:     Dental advisory  given  Plan Discussed with: Surgeon  Anesthesia Plan Comments:        Anesthesia Quick Evaluation

## 2020-08-12 NOTE — Progress Notes (Signed)
Patient Demographics:    Theodore Mendoza, is a 80 y.o. male, DOB - 1940/10/16, XHB:[id number]  Admit date - 08/11/2020   Admitting Physician Murlean Iba, MD  Outpatient Primary MD for the patient is Loman Brooklyn, FNP  LOS - 1   Chief Complaint  Patient presents with  . Hypotension        Subjective:    Theodore Mendoza today has nonbilious emesis , no hematemesis, had tarry stool ,  No chest pain, no productive cough ,low-grade temp noted  Assessment  & Plan :    Principal Problem:   Rectal bleeding Active Problems:   Acute lower UTI   GBS (Guillain Barre syndrome) (HCC)   Essential (primary) hypertension   Hyperlipidemia   History of TIA (transient ischemic attack)   Idiopathic peripheral neuropathy   Late effects of CVA (cerebrovascular accident)   Meningioma (HCC)   Paraplegia, incomplete (HCC)   Atrial fibrillation (HCC)   Symptomatic anemia   Abdominal aortic aneurysm (AAA) without rupture ([place])   History of non-ST elevation myocardial infarction (NSTEMI)   Long term current use of anticoagulant   Acute blood loss anemia  Brief Summary:- 80 y.o. male with medical history significant paroxysmal atrial fibrillation,  aortic aneurysm, history of GI bleeding status post EGD 12/18/2019 that showed a large ulcerated bleeding gastric polyp status post removal with hot snare and placement of hemostatic clips x2 admitted on 08/11/2020 with concerns for GI bleed  A/p 1) Rectal Bleeding/acute GI bleed/H/o Ulcerated bleeding Gastric Polyp --- GI consult appreciated plans for EGD on 08/13/2020 if afebrile  2) acute on chronic anemia secondary to ABLA--- EGD planned as above #1 -1 unit of PRBC transfused and 08/12/2020 for symptomatic anemia with hemoglobin down to 7.3 in the setting of ongoing GI bleed -iv Protonix as ordered  3)PAFib--- hold Eliquis due to #1 and #2 above, continue to  question  4)HTN--stable, continue amlodipine 5 mg daily and lisinopril 5 mg daily, IV hydralazine as needed elevated BP  5)H/o CVA/CAD--- hold Plavix due to #1 #2, continue pravastatin  6)Presumed UTI--- patient had low-grade temps this morning, UA suspicious for UTI, IV Rocephin pending culture  7)BPH--stable, continue Flomax  8)AAA--Infrarenal continues to follow-up with vascular surgery, previously evaluated by Dr. Trula Slade -reportedly operative intervention was recommended if aneurysm reaches 5 cm and is now 4.7 cm.  9)Neuromuscular deficits due to underlying Lonia Blood syndrome--at baseline patient gets around with a wheelchair  Disposition/Need for in-Hospital Stay- patient unable to be discharged at this time due to acute on chronic anemia due to GI bleed/acute blood loss requiring further endoluminal evaluation/EGD possibly on 08/13/2020 as well as presumed UTI requiring IV antibiotics  Status is: Inpatient  Remains inpatient appropriate because:Please see disposition above  Disposition: The patient is from: Home              Anticipated d/c is to: Home              Anticipated d/c date is: 2 days              Patient currently is not medically stable to d/c. Barriers: Not Clinically Stable-   Code Status :  -  Code Status: Full Code   Family Communication:  NA (patient is alert, awake and coherent)   Consults  :  Gi  DVT Prophylaxis  :   - SCDs  SCDs Start: 08/11/20 1350    Lab Results  Component Value Date   PLT 236 08/12/2020    Inpatient Medications  Scheduled Meds: . sodium chloride   Intravenous Once  . amLODipine  5 mg Oral Daily  . dofetilide  500 mcg Oral BID  . escitalopram  10 mg Oral QHS  . furosemide  20 mg Oral Daily  . gabapentin  600 mg Oral BID  . lisinopril  5 mg Oral Daily  . pantoprazole (PROTONIX) IV  40 mg Intravenous Q12H  . potassium chloride  20 mEq Oral Daily  . pravastatin  40 mg Oral q1800  . tamsulosin  0.4 mg Oral QPC  supper  . vitamin B-12  1,000 mcg Oral Daily   Continuous Infusions: . sodium chloride 45 mL/hr at 08/11/20 1422  . cefTRIAXone (ROCEPHIN)  IV     PRN Meds:.acetaminophen **OR** acetaminophen, hydrALAZINE, ondansetron **OR** ondansetron (ZOFRAN) IV, traZODone    Anti-infectives (From admission, onward)   Start     Dose/Rate Route Frequency Ordered Stop   08/12/20 1515  cefTRIAXone (ROCEPHIN) 1 g in sodium chloride 0.9 % 100 mL IVPB        1 g 200 mL/hr over 30 Minutes Intravenous Every 24 hours 08/12/20 1416          Objective:   Vitals:   08/12/20 0832 08/12/20 1009 08/12/20 1042 08/12/20 1331  BP:  132/71 128/65 (!) 134/57  Pulse:  94 87 78  Resp:  18 18 18   Temp: 98.4 F (36.9 C) 98.8 F (37.1 C) 98.9 F (37.2 C) 99 F (37.2 C)  TempSrc: Axillary Oral Axillary Oral  SpO2:  97% 97% 97%  Weight:      Height:        Wt Readings from Last 3 Encounters:  08/11/20 68 kg  05/23/20 78.5 kg  05/06/20 78.5 kg     Intake/Output Summary (Last 24 hours) at 08/12/2020 1440 Last data filed at 08/12/2020 1327 Gross per 24 hour  Intake 978.5 ml  Output 220 ml  Net 758.5 ml   Physical Exam  Gen:- Awake Alert,  In no apparent distress  HEENT:- [place].AT, No sclera icterus Neck-Supple Neck,No JVD,.  Lungs-  CTAB , fair symmetrical air movement CV- S1, S2 normal, regular  Abd-  +ve B.Sounds, Abd Soft, epigastric discomfort without rebound or guarding  Extremity/Skin:- No  edema, pedal pulses present  Psych-affect is appropriate, oriented x3 Neuro-baseline neuromuscular deficits related to underlying Guillain-Barr syndrome ,no additional new focal deficits, no tremors   Data Review:   Micro Results Recent Results (from the past 240 hour(s))  Resp Panel by RT-PCR (Flu A&B, Covid) Nasopharyngeal Swab     Status: None   Collection Time: 08/11/20  3:07 PM   Specimen: Nasopharyngeal Swab; Nasopharyngeal(NP) swabs in vial transport medium  Result Value Ref Range Status   SARS  Coronavirus 2 by RT PCR NEGATIVE NEGATIVE Final    Comment: (NOTE) SARS-CoV-2 target nucleic acids are NOT DETECTED.  The SARS-CoV-2 RNA is generally detectable in upper respiratory specimens during the acute phase of infection. The lowest concentration of SARS-CoV-2 viral copies this assay can detect is 138 copies/mL. A negative result does not preclude SARS-Cov-2 infection and should not be used as the sole basis for treatment or other patient management decisions. A negative result may occur with  improper  specimen collection/handling, submission of specimen other than nasopharyngeal swab, presence of viral mutation(s) within the areas targeted by this assay, and inadequate number of viral copies(<138 copies/mL). A negative result must be combined with clinical observations, patient history, and epidemiological information. The expected result is Negative.  Fact Sheet for Patients:  EntrepreneurPulse.com.au  Fact Sheet for Healthcare Providers:  IncredibleEmployment.be  This test is no t yet approved or cleared by the [place] FDA and  has been authorized for detection and/or diagnosis of SARS-CoV-2 by FDA under an Emergency Use Authorization (EUA). This EUA will remain  in effect (meaning this test can be used) for the duration of the COVID-19 declaration under Section 564(b)(1) of the Act, 21 U.S.C.section 360bbb-3(b)(1), unless the authorization is terminated  or revoked sooner.       Influenza A by PCR NEGATIVE NEGATIVE Final   Influenza B by PCR NEGATIVE NEGATIVE Final    Comment: (NOTE) The Xpert Xpress SARS-CoV-2/FLU/RSV plus assay is intended as an aid in the diagnosis of influenza from Nasopharyngeal swab specimens and should not be used as a sole basis for treatment. Nasal washings and aspirates are unacceptable for Xpert Xpress SARS-CoV-2/FLU/RSV testing.  Fact Sheet for  Patients: EntrepreneurPulse.com.au  Fact Sheet for Healthcare Providers: IncredibleEmployment.be  This test is not yet approved or cleared by the [place] FDA and has been authorized for detection and/or diagnosis of SARS-CoV-2 by FDA under an Emergency Use Authorization (EUA). This EUA will remain in effect (meaning this test can be used) for the duration of the COVID-19 declaration under Section 564(b)(1) of the Act, 21 U.S.C. section 360bbb-3(b)(1), unless the authorization is terminated or revoked.  Performed at [facility], [address], [place], [place] [address]   Culture, blood (Routine X 2) w Reflex to ID Panel     Status: None (Preliminary result)   Collection Time: 08/12/20  8:11 AM   Specimen: BLOOD RIGHT ARM  Result Value Ref Range Status   Specimen Description   Final    BLOOD RIGHT ARM BOTTLES DRAWN AEROBIC AND ANAEROBIC   Special Requests   Final    Blood Culture adequate volume Performed at [facility], [address], [place], [place] [address]    Culture PENDING  Incomplete   Report Status PENDING  Incomplete  Culture, blood (Routine X 2) w Reflex to ID Panel     Status: None (Preliminary result)   Collection Time: 08/12/20  8:11 AM   Specimen: BLOOD RIGHT HAND  Result Value Ref Range Status   Specimen Description   Final    BLOOD RIGHT HAND BOTTLES DRAWN AEROBIC AND ANAEROBIC   Special Requests   Final    Blood Culture adequate volume Performed at [facility], [address], [place], [place] [address]    Culture PENDING  Incomplete   Report Status PENDING  Incomplete    Radiology Reports CT Head Wo Contrast  Result Date: 08/11/2020 CLINICAL DATA:  Intermittent dizziness.  Recent fall. EXAM: CT HEAD WITHOUT CONTRAST TECHNIQUE: Contiguous axial images were obtained from the base of the skull through the vertex without intravenous contrast. COMPARISON:  10/01/2015 FINDINGS: Brain: Again noted is the partially  calcified extra-axial mass in the CP angle on the left most compatible with meningioma, measuring 3 cm, stable since prior study. Mass effect on the adjacent left cerebellar hemisphere. No midline shift. No acute infarct, hemorrhage or hydrocephalus. Mild chronic small vessel disease throughout the deep white matter. Vascular: No hyperdense vessel or unexpected calcification Skull:  No calvarial abnormality. Sinuses/Orbits: Short air-fluid level in the left sphenoid sinus, new since prior study. Scattered mucosal thickening in the ethmoid air cells. Other: None IMPRESSION: No acute intracranial abnormality. Stable left CP angle meningioma. Mild acute on chronic sinusitis. Electronically Signed   By: Rolm Baptise M.D.   On: 08/11/2020 09:52   CT ABDOMEN PELVIS W CONTRAST  Result Date: 08/11/2020 CLINICAL DATA:  Melena, hematuria EXAM: CT ABDOMEN AND PELVIS WITH CONTRAST TECHNIQUE: Multidetector CT imaging of the abdomen and pelvis was performed using the standard protocol following bolus administration of intravenous contrast. CONTRAST:  115mL OMNIPAQUE IOHEXOL 300 MG/ML  SOLN COMPARISON:  03/17/2020 FINDINGS: Lower chest: No acute abnormality. Hepatobiliary: No focal hepatic abnormality. Gallbladder unremarkable. Pancreas: Pancreatic atrophy. No focal abnormality or ductal dilatation. Spleen: No focal abnormality.  Normal size. Adrenals/Urinary Tract: Right renal parapelvic cysts. No hydronephrosis. Complex cyst in the lower pole of the right kidney measures 2.6 cm with peripheral calcifications, stable since prior study. No renal or ureteral stones. Urinary bladder decompressed, grossly unremarkable. Stomach/Bowel: Scattered colonic diverticulosis, most pronounced in the right colon. No active diverticulitis. Stomach and small bowel decompressed, unremarkable. No bowel obstruction. Vascular/Lymphatic: Heavily calcified aorta and iliac vessels. 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 Multiple  penetrating ulcers are again noted in the abdominal aorta. No significant change in the appearance since prior study. No adenopathy. Reproductive: Mildly prominent prostate. Other: No free fluid or free air. Musculoskeletal: No acute bony abnormality. IMPRESSION: Colonic diverticulosis.  No active diverticulitis. Right renal cysts, stable.  No stones or hydronephrosis. Multiple penetrating ulcers in the abdominal aorta with pseudoaneurysm in the infrarenal abdominal aorta measuring up to 4.7 cm. Overall appearance is stable since prior study. Prostate enlargement. Electronically Signed   By: Rolm Baptise M.D.   On: 08/11/2020 12:53   DG Chest Port 1 View  Result Date: 08/11/2020 CLINICAL DATA:  Weakness EXAM: PORTABLE CHEST 1 VIEW COMPARISON:  October 02, 2015 chest radiograph; chest CT March 17, 2020 FINDINGS: Lungs are clear. Heart size and pulmonary vascularity are normal. No adenopathy. There is aortic atherosclerosis. No bone lesions. IMPRESSION: Lungs clear. Heart size normal. Aortic Atherosclerosis (ICD10-I70.0). Electronically Signed   By: Lowella Grip III M.D.   On: 08/11/2020 08:54     CBC Recent Labs  Lab 08/11/20 0838 08/11/20 1658 08/12/20 0458  WBC 6.1  --  13.2*  HGB 8.4* 8.4* 7.3*  HCT 27.2* 27.2* 24.8*  PLT 250  --  236  MCV 93.2  --  95.4  MCH 28.8  --  28.1  MCHC 30.9  --  29.4*  RDW 14.1  --  14.5  LYMPHSABS 0.5*  --   --   MONOABS 0.3  --   --   EOSABS 0.1  --   --   BASOSABS 0.1  --   --     Chemistries  Recent Labs  Lab 08/11/20 0838 08/12/20 0458  NA 143 142  K 4.3 3.7  CL 107 107  CO2 31 27  GLUCOSE 123* 102*  BUN 17 17  CREATININE 0.78 0.81  CALCIUM 8.9 8.3*  MG  --  1.9  AST 12* 11*  ALT 7 7  ALKPHOS 57 50  BILITOT 1.1 1.2   ------------------------------------------------------------------------------------------------------------------ No results for input(s): CHOL, HDL, LDLCALC, TRIG, CHOLHDL, LDLDIRECT in the last 72 hours.  Lab  Results  Component Value Date   HGBA1C 4.7 05/06/2020   ------------------------------------------------------------------------------------------------------------------ No results for input(s): TSH, T4TOTAL, T3FREE, THYROIDAB in the  last 72 hours.  Invalid input(s): FREET3 ------------------------------------------------------------------------------------------------------------------ No results for input(s): VITAMINB12, FOLATE, FERRITIN, TIBC, IRON, RETICCTPCT in the last 72 hours.  Coagulation profile No results for input(s): INR, PROTIME in the last 168 hours.  No results for input(s): DDIMER in the last 72 hours.  Cardiac Enzymes No results for input(s): CKMB, TROPONINI, MYOGLOBIN in the last 168 hours.  Invalid input(s): CK ------------------------------------------------------------------------------------------------------------------ No results found for: BNP   Roxan Hockey M.D on 08/12/2020 at 2:40 PM  Go to [email or web address] - for contact info  [facility] - Office  [phone]

## 2020-08-12 NOTE — Progress Notes (Signed)
Urinalysis positive for nitrites leukocytosis and RBCs. Also positive for ketones.  Antibiotics per Dr. Roxan Hockey. Will increase IV fluids to 75 mL/h.

## 2020-08-12 NOTE — Care Management Important Message (Signed)
Important Message  Patient Details  Name: Theodore Mendoza MRN: [id number] Date of Birth: 03/25/1941   Medicare Important Message Given:  Yes     [name] 08/12/2020, 12:50 PM

## 2020-08-12 NOTE — Progress Notes (Signed)
MD notified of patient being nauseated and unable to take po meds at this time. MD ordered Compazine, and reglan patient is somewhat better at this time. Will continue to monitor.

## 2020-08-12 NOTE — Plan of Care (Signed)

## 2020-08-12 NOTE — Progress Notes (Signed)
Found Theodore Mendoza very much asleep today and hard to rouse bedside. Chaplain provided prayer and will return to attempt a second visit. Chaplain will remain available as needed or requested.

## 2020-08-12 NOTE — Progress Notes (Signed)
Subjective:  Patient states he does not feel well.  He was up all night with nausea and heaving and vomited 5 or 6 times.  He just vomited scant amount of clear fluid.  He did have a bowel movement about an hour ago.  According to nursing staff it was small and tarry.  He denies chest pain shortness of breath abdominal pain or dysuria.  Current Medications:  Current Facility-Administered Medications:  .  0.9 %  sodium chloride infusion (Manually program via Guardrails IV Fluids), , Intravenous, Once, Reubin Milan, MD, Last Rate: 10 mL/hr at 08/12/20 0811, Rate Change at 08/12/20 0811 .  0.9 %  sodium chloride infusion, , Intravenous, Continuous, Johnson, Clanford L, MD, Last Rate: 45 mL/hr at 08/11/20 1422, New Bag at 08/11/20 1422 .  acetaminophen (TYLENOL) tablet 650 mg, 650 mg, Oral, Q6H PRN **OR** acetaminophen (TYLENOL) suppository 650 mg, 650 mg, Rectal, Q6H PRN, Johnson, Clanford L, MD .  amLODipine (NORVASC) tablet 5 mg, 5 mg, Oral, Daily, Johnson, Clanford L, MD, 5 mg at 08/11/20 1628 .  dofetilide (TIKOSYN) capsule 500 mcg, 500 mcg, Oral, BID, Johnson, Clanford L, MD .  escitalopram (LEXAPRO) tablet 10 mg, 10 mg, Oral, QHS, Johnson, Clanford L, MD .  furosemide (LASIX) tablet 20 mg, 20 mg, Oral, Daily, Johnson, Clanford L, MD .  gabapentin (NEURONTIN) capsule 600 mg, 600 mg, Oral, BID, Johnson, Clanford L, MD .  hydrALAZINE (APRESOLINE) injection 5 mg, 5 mg, Intravenous, Q8H PRN, Johnson, Clanford L, MD .  lisinopril (ZESTRIL) tablet 5 mg, 5 mg, Oral, Daily, Johnson, Clanford L, MD .  ondansetron (ZOFRAN) tablet 4 mg, 4 mg, Oral, Q6H PRN **OR** ondansetron (ZOFRAN) injection 4 mg, 4 mg, Intravenous, Q6H PRN, Wynetta Emery, Clanford L, MD, 4 mg at 08/11/20 2119 .  pantoprazole (PROTONIX) injection 40 mg, 40 mg, Intravenous, Q12H, Zianna Dercole U, MD, 40 mg at 08/12/20 0812 .  pravastatin (PRAVACHOL) tablet 40 mg, 40 mg, Oral, q1800, Johnson, Clanford L, MD, 40 mg at 08/11/20 1628 .   tamsulosin (FLOMAX) capsule 0.4 mg, 0.4 mg, Oral, QPC supper, Johnson, Clanford L, MD, 0.4 mg at 08/11/20 1629 .  traZODone (DESYREL) tablet 25 mg, 25 mg, Oral, QHS PRN, Johnson, Clanford L, MD .  vitamin B-12 (CYANOCOBALAMIN) tablet 1,000 mcg, 1,000 mcg, Oral, Daily, Johnson, Clanford L, MD, 1,000 mcg at 08/11/20 1628   Objective: Blood pressure (!) 101/55, pulse 99, temperature 100.2 F (37.9 C), temperature source Oral, resp. rate 18, height 5' 8"  (1.727 m), weight 68 kg, SpO2 95 %. Patient is alert and in no acute distress. During examination I noted he was febrile.  Temperature was checked x100.2. Conjunctivae is pale. Cardiac exam with regular rhythm normal S1 and S2.  He has faint systolic murmur at aortic area. Lungs are clear to auscultation. Abdomen is symmetrical.  Bowel sounds are normal.  No abdominal bruit noted.  On palpation abdomen is soft.  Biliary cannulation was easily palpable in midepigastric region above umbilicus.  No organomegaly. He has 2+ pitting edema involving distal half of the legs. He has he has atrophy to muscles of both hands.  Labs/studies Results:  CBC Latest Ref Rng & Units 08/12/2020 08/11/2020 08/11/2020  WBC 4.0 - 10.5 K/uL 13.2(H) - 6.1  Hemoglobin 13.0 - 17.0 g/dL 7.3(L) 8.4(L) 8.4(L)  Hematocrit 39.0 - 52.0 % 24.8(L) 27.2(L) 27.2(L)  Platelets 150 - 400 K/uL 236 - 250    CMP Latest Ref Rng & Units 08/12/2020 08/11/2020 05/06/2020  Glucose 70 -  99 mg/dL 102(H) 123(H) 86  BUN 8 - 23 mg/dL 17 17 14   Creatinine 0.61 - 1.24 mg/dL 0.81 0.78 0.80  Sodium 135 - 145 mmol/L 142 143 145(H)  Potassium 3.5 - 5.1 mmol/L 3.7 4.3 4.2  Chloride 98 - 111 mmol/L 107 107 105  CO2 22 - 32 mmol/L 27 31 29   Calcium 8.9 - 10.3 mg/dL 8.3(L) 8.9 9.2  Total Protein 6.5 - 8.1 g/dL 5.3(L) 6.0(L) 5.7(L)  Total Bilirubin 0.3 - 1.2 mg/dL 1.2 1.1 0.7  Alkaline Phos 38 - 126 U/L 50 57 67  AST 15 - 41 U/L 11(L) 12(L) 13  ALT 0 - 44 U/L 7 7 6     Hepatic Function Latest Ref Rng  & Units 08/12/2020 08/11/2020 05/06/2020  Total Protein 6.5 - 8.1 g/dL 5.3(L) 6.0(L) 5.7(L)  Albumin 3.5 - 5.0 g/dL 3.1(L) 3.4(L) 3.8  AST 15 - 41 U/L 11(L) 12(L) 13  ALT 0 - 44 U/L 7 7 6   Alk Phosphatase 38 - 126 U/L 50 57 67  Total Bilirubin 0.3 - 1.2 mg/dL 1.2 1.1 0.7      Assessment:  #1.  GI bleed.  Patient's hemoglobin has dropped by 1 g since admission.  His baseline is presumed to be around 11-1/2 g.  No evidence of active bleeding.  He has history of upper GI bleed secondary to gastric polyp back in September 2021.  EGD planned for later today we will cancel because he is febrile.  #2.  Anemia secondary to GI bleed.  Discussed with Dr. Roxan Hockey.  Patient received 1 unit of PRBCs when temperature is down.  #3.  Fever.  He does not have any respiratory or urinary symptoms.  He has been vomiting during the night.  Need to rule out aspiration.  Further work-up by Dr. Roxan Hockey.  #4.  History of coronary artery disease and paroxysmal atrial fibrillation.  Anticoagulation is on hold because of GI bleed.  #5.  Motor deficit secondary to history of Guillain-Barr syndrome.  Patient is wheelchair-bound.   Recommendations  Hold esophagogastroduodenoscopy until source of fever sorted out. Consider chest film to rule out aspiration. PRBC transfusion when temperature down to normal. Patient can have clear liquids since EGD on hold.

## 2020-08-12 NOTE — Care Management Important Message (Signed)
Important Message  Patient Details  Name: Theodore Mendoza MRN: [id number] Date of Birth: 13-Jul-1940   Medicare Important Message Given:  Yes     [name] 08/12/2020, 12:51 PM

## 2020-08-12 NOTE — ED Provider Notes (Signed)
[facility] Provider Note   CSN: [id number] Arrival date & time: 08/11/20  0758     History Chief Complaint  Patient presents with  . Hypotension    GARYSON STELLY is a 80 y.o. male.  Patient complains of weakness and low blood pressure.  No pain.  He also states that he has had bloody stools yesterday and today  The history is provided by the patient and medical records. No language interpreter was used.  Weakness Severity:  Moderate Onset quality:  Sudden Timing:  Constant Progression:  Worsening Chronicity:  New Context: not alcohol use   Relieved by:  Nothing Worsened by:  Nothing Associated symptoms: no abdominal pain, no chest pain, no cough, no diarrhea, no frequency, no headaches and no seizures        Past Medical History:  Diagnosis Date  . AAA (abdominal aortic aneurysm) ([place])   . Brain tumor ([place])    x2  . Cardiac arrhythmia   . CHF (congestive heart failure) ([place])   . Guillain-Barre syndrome ([place]) Feb 13 1986  . Hypertension   . Kidney stones   . Myocardial infarction [facility]) 2007    Patient Active Problem List   Diagnosis Date Noted  . Acute lower UTI 08/12/2020  . GBS (Guillain Barre syndrome) ([place]) 08/12/2020  . Rectal bleeding 08/11/2020  . Acute blood loss anemia 08/11/2020  . Fall 08/08/2020  . Long term current use of anticoagulant 01/20/2020  . Generalized edema 12/28/2019  . Abdominal aortic aneurysm (AAA) without rupture ([place])   . Gastric polyp   . Symptomatic anemia 12/14/2019  . Atrial fibrillation ([place]) 04/21/2019  . Paraplegia, incomplete ([place]) 10/16/2018  . Idiopathic peripheral neuropathy 06/26/2017  . Late effects of CVA (cerebrovascular accident) 11/16/2015  . History of TIA (transient ischemic attack) 10/01/2015  . Hyperlipidemia 05/08/2014  . Essential (primary) hypertension 05/07/2014  . Meningioma ([place]) 12/13/2011  . History of non-ST elevation myocardial infarction (NSTEMI) 01/17/2011    Past  Surgical History:  Procedure Laterality Date  . CORONARY ANGIOPLASTY  1997   after mi  . CYSTOSCOPY WITH RETROGRADE PYELOGRAM, URETEROSCOPY AND STENT PLACEMENT Left 06/28/2014   Procedure: 1ST STAGE CYSTOSCOPY/URETEROSCOPY/STENT PLACEMENT;  Surgeon: Alexis Frock, MD;  Location: [facility];  Service: Urology;  Laterality: Left;  . CYSTOSCOPY WITH STENT PLACEMENT Left 05/08/2014   Procedure: CYSTOSCOPY, RETROGRADE PYELOGRAM WITH LEFT URETERAL STENT PLACEMENT;  Surgeon: Alexis Frock, MD;  Location: [facility];  Service: Urology;  Laterality: Left;  . ESOPHAGOGASTRODUODENOSCOPY (EGD) WITH PROPOFOL N/A 12/16/2019   Procedure: ESOPHAGOGASTRODUODENOSCOPY (EGD) WITH PROPOFOL;  Surgeon: Mauri Pole, MD;  Location: [facility];  Service: Endoscopy;  Laterality: N/A;  . EYE SURGERY Right may 2015   growth removed, july 2015left eye cataract removed, right eye catarct removed  . gamma kniferadiation treatment  Feb 09 2014   [facility]  . HEMOSTASIS CLIP PLACEMENT  12/16/2019   Procedure: HEMOSTASIS CLIP PLACEMENT;  Surgeon: Mauri Pole, MD;  [facility];  Service: Endoscopy;;  . HEMOSTASIS CONTROL  12/16/2019   Procedure: HEMOSTASIS CONTROL;  Surgeon: Mauri Pole, MD;  [facility]: [facility];  Service: Endoscopy;;  Endoloop  . HOLMIUM LASER APPLICATION Left 1/66/0630   Procedure: HOLMIUM LASER APPLICATION;  Surgeon: Alexis Frock, MD;  Location: [facility];  Service: Urology;  Laterality: Left;  . LITHOTRIPSY  years ago  . POLYPECTOMY  12/16/2019   Procedure: POLYPECTOMY;  Surgeon: Mauri Pole, MD;  [facility];  Service:  Endoscopy;;  . STONE EXTRACTION WITH BASKET Left 06/28/2014   Procedure: STONE EXTRACTION WITH BASKET;  Surgeon: Sebastian Acheheodore Manny, MD;  Location: [facility];  Service: Urology;  Laterality: Left;       Family History  Problem Relation Age of Onset  . Diabetes Mother   . Lung cancer Mother   . CAD Father   . Diabetes Brother   .  Diabetes Sister   . Stroke Sister   . Other Maternal Grandfather        brain tumor  . Colon cancer Neg Hx   . Pancreatic cancer Neg Hx   . Esophageal cancer Neg Hx     Social History   Tobacco Use  . Smoking status: Never Smoker  . Smokeless tobacco: Never Used  Vaping Use  . Vaping Use: Never used  Substance Use Topics  . Alcohol use: No  . Drug use: No    Home Medications Prior to Admission medications   Medication Sig Start Date End Date Taking? Authorizing Provider  amLODipine (NORVASC) 5 MG tablet TAKE 1 TABLET BY MOUTH DAILY. Patient taking differently: Take 5 mg by mouth daily. 03/21/20  Yes Dettinger, Elige RadonJoshua A, MD  apixaban (ELIQUIS) 5 MG TABS tablet Take 1 tablet (5 mg total) by mouth 2 (two) times daily. 06/06/20  Yes Deliah BostonJoyce, Britney F, FNP  clopidogrel (PLAVIX) 75 MG tablet TAKE 1 TABLET ONCE DAILY Patient taking differently: Take 75 mg by mouth daily. 08/03/20  Yes Deliah BostonJoyce, Britney F, FNP  dofetilide (TIKOSYN) 500 MCG capsule Take 1 capsule (500 mcg total) by mouth 2 (two) times daily. 06/06/20  Yes Deliah BostonJoyce, Britney F, FNP  escitalopram (LEXAPRO) 10 MG tablet TAKE 1 TABLET AT BEDTIME Patient taking differently: Take 10 mg by mouth at bedtime. 08/05/20  Yes Deliah BostonJoyce, Britney F, FNP  Ferrous Sulfate (IRON) 325 (65 Fe) MG TABS Take 1 tablet by mouth daily.    Yes [provider]  furosemide (LASIX) 20 MG tablet Take 1 tablet (20 mg total) by mouth daily. 05/06/20  Yes Deliah BostonJoyce, Britney F, FNP  gabapentin (NEURONTIN) 300 MG capsule TAKE 1 CAPSULE TWICE DAILY AND 2 AT BEDTIME Patient taking differently: Take 300 mg by mouth See admin instructions. 2 capsules in the day and 2 at bedtime 07/20/20  Yes Deliah BostonJoyce, Britney F, FNP  lisinopril (ZESTRIL) 5 MG tablet TAKE 1 TABLET ONCE DAILY Patient taking differently: Take 5 mg by mouth daily. 05/20/20  Yes Deliah BostonJoyce, Britney F, FNP  pantoprazole (PROTONIX) 40 MG tablet TAKE  (1)  TABLET TWICE A DAY. Patient taking differently: Take 40 mg by mouth 2  (two) times daily. 06/29/20  Yes Deliah BostonJoyce, Britney F, FNP  pravastatin (PRAVACHOL) 40 MG tablet TAKE 1 TABLET ONCE DAILY WITH LUNCH Patient taking differently: Take 40 mg by mouth daily. 04/04/20  Yes Hawks, Christy A, FNP  sucralfate (CARAFATE) 1 g tablet TAKE  (1)  TABLET  FOUR TIMES DAILY. Patient taking differently: Take 1 g by mouth 2 (two) times daily. 02/10/20  Yes Deliah BostonJoyce, Britney F, FNP  tamsulosin (FLOMAX) 0.4 MG CAPS capsule TAKE 1 CAPSULE ONCE DAILY Patient taking differently: Take 0.4 mg by mouth daily. 05/20/20  Yes Deliah BostonJoyce, Britney F, FNP  vitamin B-12 (CYANOCOBALAMIN) 1000 MCG tablet Take 1 tablet (1,000 mcg total) by mouth daily. 12/18/19  Yes Tyrone NineGrunz, Ryan B, MD    Allergies    Hydrocodone-acetaminophen, Morphine and related, and Valsartan  Review of Systems   Review of Systems  Constitutional: Negative for appetite change and  fatigue.  HENT: Negative for congestion, ear discharge and sinus pressure.   Eyes: Negative for discharge.  Respiratory: Negative for cough.   Cardiovascular: Negative for chest pain.  Gastrointestinal: Negative for abdominal pain and diarrhea.       Bloody stools  Genitourinary: Negative for frequency and hematuria.  Musculoskeletal: Negative for back pain.  Skin: Negative for rash.  Neurological: Positive for weakness. Negative for seizures and headaches.  Psychiatric/Behavioral: Negative for hallucinations.    Physical Exam Updated Vital Signs BP (!) 134/57 (BP Location: Right Arm) Comment: Nurse Colletta Maryland Notified  Pulse 78   Temp 99 F (37.2 C) (Oral)   Resp 18   Ht 5\' 8"  (1.727 m)   Wt 68 kg   SpO2 97%   BMI 22.81 kg/m   Physical Exam Vitals and nursing note reviewed.  Constitutional:      Appearance: He is well-developed.  HENT:     Head: Normocephalic.     Mouth/Throat:     Mouth: Mucous membranes are moist.  Eyes:     General: No scleral icterus.    Conjunctiva/sclera: Conjunctivae normal.  Neck:     Thyroid: No thyromegaly.   Cardiovascular:     Rate and Rhythm: Normal rate and regular rhythm.     Heart sounds: No murmur heard. No friction rub. No gallop.   Pulmonary:     Breath sounds: No stridor. No wheezing or rales.  Chest:     Chest wall: No tenderness.  Abdominal:     General: There is no distension.     Tenderness: There is no abdominal tenderness. There is no rebound.  Genitourinary:    Comments: Heme positive brown stool Musculoskeletal:        General: Normal range of motion.     Cervical back: Neck supple.  Lymphadenopathy:     Cervical: No cervical adenopathy.  Skin:    Findings: No erythema or rash.  Neurological:     Mental Status: He is alert and oriented to person, place, and time.     Motor: No abnormal muscle tone.     Coordination: Coordination normal.  Psychiatric:        Behavior: Behavior normal.     ED Results / Procedures / Treatments   Labs (all labs ordered are listed, but only abnormal results are displayed) Labs Reviewed  CBC WITH DIFFERENTIAL/PLATELET - Abnormal; Notable for the following components:      Result Value   RBC 2.92 (*)    Hemoglobin 8.4 (*)    HCT 27.2 (*)    Lymphs Abs 0.5 (*)    All other components within normal limits  COMPREHENSIVE METABOLIC PANEL - Abnormal; Notable for the following components:   Glucose, Bld 123 (*)    Total Protein 6.0 (*)    Albumin 3.4 (*)    AST 12 (*)    All other components within normal limits  HEMOGLOBIN AND HEMATOCRIT, BLOOD - Abnormal; Notable for the following components:   Hemoglobin 8.4 (*)    HCT 27.2 (*)    All other components within normal limits  COMPREHENSIVE METABOLIC PANEL - Abnormal; Notable for the following components:   Glucose, Bld 102 (*)    Calcium 8.3 (*)    Total Protein 5.3 (*)    Albumin 3.1 (*)    AST 11 (*)    All other components within normal limits  CBC - Abnormal; Notable for the following components:   WBC 13.2 (*)    RBC 2.60 (*)  Hemoglobin 7.3 (*)    HCT 24.8 (*)     MCHC 29.4 (*)    All other components within normal limits  URINALYSIS, ROUTINE W REFLEX MICROSCOPIC - Abnormal; Notable for the following components:   Color, Urine AMBER (*)    APPearance CLOUDY (*)    Hgb urine dipstick LARGE (*)    Ketones, ur 20 (*)    Protein, ur 100 (*)    Nitrite POSITIVE (*)    Leukocytes,Ua MODERATE (*)    RBC / HPF >50 (*)    WBC, UA >50 (*)    Bacteria, UA MANY (*)    All other components within normal limits  POC OCCULT BLOOD, ED - Abnormal; Notable for the following components:   Fecal Occult Bld POSITIVE (*)    All other components within normal limits  RESP PANEL BY RT-PCR (FLU A&B, COVID) ARPGX2  CULTURE, BLOOD (ROUTINE X 2)  CULTURE, BLOOD (ROUTINE X 2)  URINE CULTURE  MAGNESIUM  TYPE AND SCREEN  PREPARE RBC (CROSSMATCH)  ABO/RH  TROPONIN I (HIGH SENSITIVITY)  TROPONIN I (HIGH SENSITIVITY)    EKG EKG Interpretation  Date/Time:  Thursday Aug 11 2020 08:10:34 EDT Ventricular Rate:  84 PR Interval:  173 QRS Duration: 99 QT Interval:  387 QTC Calculation: 458 R Axis:   18 Text Interpretation: Sinus rhythm No significant change since last tracing Confirmed by Lorre Nick ([id number]) on 08/12/2020 10:50:07 AM   Radiology CT Head Wo Contrast  Result Date: 08/11/2020 CLINICAL DATA:  Intermittent dizziness.  Recent fall. EXAM: CT HEAD WITHOUT CONTRAST TECHNIQUE: Contiguous axial images were obtained from the base of the skull through the vertex without intravenous contrast. COMPARISON:  10/01/2015 FINDINGS: Brain: Again noted is the partially calcified extra-axial mass in the CP angle on the left most compatible with meningioma, measuring 3 cm, stable since prior study. Mass effect on the adjacent left cerebellar hemisphere. No midline shift. No acute infarct, hemorrhage or hydrocephalus. Mild chronic small vessel disease throughout the deep white matter. Vascular: No hyperdense vessel or unexpected calcification Skull: No calvarial abnormality.  Sinuses/Orbits: Short air-fluid level in the left sphenoid sinus, new since prior study. Scattered mucosal thickening in the ethmoid air cells. Other: None IMPRESSION: No acute intracranial abnormality. Stable left CP angle meningioma. Mild acute on chronic sinusitis. Electronically Signed   By: Charlett Nose M.D.   On: 08/11/2020 09:52   CT ABDOMEN PELVIS W CONTRAST  Result Date: 08/11/2020 CLINICAL DATA:  Melena, hematuria EXAM: CT ABDOMEN AND PELVIS WITH CONTRAST TECHNIQUE: Multidetector CT imaging of the abdomen and pelvis was performed using the standard protocol following bolus administration of intravenous contrast. CONTRAST:  OMNIPAQUE IOHEXOL 300 MG/ML  SOLN COMPARISON:  03/17/2020 FINDINGS: Lower chest: No acute abnormality. Hepatobiliary: No focal hepatic abnormality. Gallbladder unremarkable. Pancreas: Pancreatic atrophy. No focal abnormality or ductal dilatation. Spleen: No focal abnormality.  Normal size. Adrenals/Urinary Tract: Right renal parapelvic cysts. No hydronephrosis. Complex cyst in the lower pole of the right kidney measures 2.6 cm with peripheral calcifications, stable since prior study. No renal or ureteral stones. Urinary bladder decompressed, grossly unremarkable. Stomach/Bowel: Scattered colonic diverticulosis, most pronounced in the right colon. No active diverticulitis. Stomach and small bowel decompressed, unremarkable. No bowel obstruction. Vascular/Lymphatic: Heavily calcified aorta and iliac vessels. 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 Multiple penetrating ulcers are again noted in the abdominal aorta. No significant change in the appearance since prior study. No adenopathy. Reproductive: Mildly prominent prostate. Other: No free fluid or free  air. Musculoskeletal: No acute bony abnormality. IMPRESSION: Colonic diverticulosis.  No active diverticulitis. Right renal cysts, stable.  No stones or hydronephrosis. Multiple penetrating ulcers in the  abdominal aorta with pseudoaneurysm in the infrarenal abdominal aorta measuring up to 4.7 cm. Overall appearance is stable since prior study. Prostate enlargement. Electronically Signed   By: Rolm Baptise M.D.   On: 08/11/2020 12:53   DG CHEST PORT 1 VIEW  Result Date: 08/12/2020 CLINICAL DATA:  Fever EXAM: PORTABLE CHEST 1 VIEW COMPARISON:  Aug 11, 2020, March 17, 2020 FINDINGS: The cardiomediastinal silhouette is unchanged in contour.Atherosclerotic calcifications of the aorta. No pleural effusion. No pneumothorax. No acute pleuroparenchymal abnormality. Gaseous distension of bowel. Multilevel degenerative changes of the thoracic spine. IMPRESSION: No acute cardiopulmonary abnormality. Electronically Signed   By: Valentino Saxon MD   On: 08/12/2020 15:21   DG Chest Port 1 View  Result Date: 08/11/2020 CLINICAL DATA:  Weakness EXAM: PORTABLE CHEST 1 VIEW COMPARISON:  October 02, 2015 chest radiograph; chest CT March 17, 2020 FINDINGS: Lungs are clear. Heart size and pulmonary vascularity are normal. No adenopathy. There is aortic atherosclerosis. No bone lesions. IMPRESSION: Lungs clear. Heart size normal. Aortic Atherosclerosis (ICD10-I70.0). Electronically Signed   By: Lowella Grip III M.D.   On: 08/11/2020 08:54    Procedures Procedures   Medications Ordered in ED Medications  hydrALAZINE (APRESOLINE) injection 5 mg (has no administration in time range)  ondansetron (ZOFRAN) tablet 4 mg ( Oral See Alternative 08/11/20 2119)    Or  ondansetron (ZOFRAN) injection 4 mg (4 mg Intravenous Given 08/11/20 2119)  acetaminophen (TYLENOL) tablet 650 mg (650 mg Oral Given 08/12/20 1032)    Or  acetaminophen (TYLENOL) suppository 650 mg ( Rectal See Alternative 08/12/20 1032)  traZODone (DESYREL) tablet 25 mg (has no administration in time range)  dofetilide (TIKOSYN) capsule 500 mcg (500 mcg Oral Given 08/12/20 1031)  escitalopram (LEXAPRO) tablet 10 mg (10 mg Oral Not Given 08/11/20 2200)   furosemide (LASIX) tablet 20 mg (20 mg Oral Given 08/12/20 1032)  gabapentin (NEURONTIN) capsule 600 mg (600 mg Oral Given 08/12/20 1032)  lisinopril (ZESTRIL) tablet 5 mg (5 mg Oral Given 08/12/20 1032)  pravastatin (PRAVACHOL) tablet 40 mg (40 mg Oral Given 08/11/20 1628)  tamsulosin (FLOMAX) capsule 0.4 mg (0.4 mg Oral Given 08/11/20 1629)  vitamin B-12 (CYANOCOBALAMIN) tablet 1,000 mcg (1,000 mcg Oral Given 08/12/20 1032)  amLODipine (NORVASC) tablet 5 mg (5 mg Oral Given 08/12/20 1032)  pantoprazole (PROTONIX) injection 40 mg (40 mg Intravenous Given 08/12/20 0812)  0.9 %  sodium chloride infusion (Manually program via Guardrails IV Fluids) ( Intravenous Not Given 08/12/20 0820)  potassium chloride SA (KLOR-CON) CR tablet 20 mEq (20 mEq Oral Given 08/12/20 1032)  cefTRIAXone (ROCEPHIN) 1 g in sodium chloride 0.9 % 100 mL IVPB (1 g Intravenous New Bag/Given 08/12/20 1452)  0.9 %  sodium chloride infusion (has no administration in time range)  sodium chloride 0.9 % bolus 500 mL (0 mLs Intravenous Stopped 08/11/20 1004)  iohexol (OMNIPAQUE) 300 MG/ML solution 100 mL (100 mLs Intravenous Contrast Given 08/11/20 1205)  prochlorperazine (COMPAZINE) injection 5 mg (5 mg Intravenous Given 08/11/20 2247)  metoCLOPramide (REGLAN) injection 5 mg (5 mg Intravenous Given 08/12/20 0013)  0.9 %  sodium chloride infusion (Manually program via Guardrails IV Fluids) ( Intravenous New Bag/Given 08/12/20 1453)  CRITICAL CARE Performed by: Milton Ferguson Total critical care time: 40 minutes Critical care time was exclusive of separately billable procedures and treating other patients.  Critical care was necessary to treat or prevent imminent or life-threatening deterioration. Critical care was time spent personally by me on the following activities: development of treatment plan with patient and/or surrogate as well as nursing, discussions with consultants, evaluation of patient's response to treatment, examination of  patient, obtaining history from patient or surrogate, ordering and performing treatments and interventions, ordering and review of laboratory studies, ordering and review of radiographic studies, pulse oximetry and re-evaluation of patient's condition.   ED Course  I have reviewed the triage vital signs and the nursing notes.  Pertinent labs & imaging results that were available during my care of the patient were reviewed by me and considered in my medical decision making (see chart for details). Patient with anemia and bloody stools have been heme positive I spoke with GI and they agree with admission to medicine and GI consult   MDM Rules/Calculators/A&P                          Patient with anemia and lower GI bleed. Final Clinical Impression(s) / ED Diagnoses Final diagnoses:  Fever    Rx / DC Orders ED Discharge Orders    None       Milton Ferguson, MD 08/12/20 1659

## 2020-08-13 ENCOUNTER — Inpatient Hospital Stay (HOSPITAL_COMMUNITY): Payer: Medicare Other | Admitting: Anesthesiology

## 2020-08-13 ENCOUNTER — Encounter (HOSPITAL_COMMUNITY)
Admission: EM | Disposition: A | Discharge status: Home / Self Care | Payer: Self-pay | Source: Home / Self Care | Attending: Family Medicine

## 2020-08-13 DIAGNOSIS — K2289 Other specified disease of esophagus: Secondary | ICD-10-CM

## 2020-08-13 DIAGNOSIS — K317 Polyp of stomach and duodenum: Secondary | ICD-10-CM

## 2020-08-13 HISTORY — PX: ESOPHAGOGASTRODUODENOSCOPY (EGD) WITH PROPOFOL: SHX5813

## 2020-08-13 LAB — COMPREHENSIVE METABOLIC PANEL
ALT: 8 U/L (ref 0–44)
AST: 12 U/L — ABNORMAL LOW (ref 15–41)
Albumin: 2.9 g/dL — ABNORMAL LOW (ref 3.5–5.0)
Alkaline Phosphatase: 48 U/L (ref 38–126)
Anion gap: 9 (ref 5–15)
BUN: 17 mg/dL (ref 8–23)
CO2: 27 mmol/L (ref 22–32)
Calcium: 8.1 mg/dL — ABNORMAL LOW (ref 8.9–10.3)
Chloride: 105 mmol/L (ref 98–111)
Creatinine, Ser: 0.72 mg/dL (ref 0.61–1.24)
GFR, Estimated: >60 mL/min (ref >60)
Glucose, Bld: 78 mg/dL (ref 70–99)
Potassium: 3.5 mmol/L (ref 3.5–5.1)
Sodium: 141 mmol/L (ref 135–145)
Total Bilirubin: 1.2 mg/dL (ref 0.3–1.2)
Total Protein: 5.2 g/dL — ABNORMAL LOW (ref 6.5–8.1)

## 2020-08-13 LAB — BPAM RBC
Blood Product Expiration Date: 202206042359
ISSUE DATE / TIME: 202205131015
Unit Type and Rh: 9500

## 2020-08-13 LAB — CBC
HCT: 28.5 % — ABNORMAL LOW (ref 39.0–52.0)
Hemoglobin: 8.8 g/dL — ABNORMAL LOW (ref 13.0–17.0)
MCH: 28.9 pg (ref 26.0–34.0)
MCHC: 30.9 g/dL (ref 30.0–36.0)
MCV: 93.4 fL (ref 80.0–100.0)
Platelets: 192 10*3/uL (ref 150–400)
RBC: 3.05 MIL/uL — ABNORMAL LOW (ref 4.22–5.81)
RDW: 14.6 % (ref 11.5–15.5)
WBC: 6.7 10*3/uL (ref 4.0–10.5)
nRBC: 0 % (ref 0.0–0.2)

## 2020-08-13 LAB — TYPE AND SCREEN
ABO/RH(D): O NEG
Antibody Screen: NEGATIVE
Unit division: 0

## 2020-08-13 LAB — MAGNESIUM: Magnesium: 1.9 mg/dL (ref 1.7–2.4)

## 2020-08-13 SURGERY — ESOPHAGOGASTRODUODENOSCOPY (EGD) WITH PROPOFOL
Anesthesia: General

## 2020-08-13 MED ORDER — PROPOFOL 10 MG/ML IV BOLUS
INTRAVENOUS | Status: DC | PRN
  Administered 2020-08-13: 50 mg via INTRAVENOUS

## 2020-08-13 MED ORDER — POTASSIUM CHLORIDE CRYS ER 20 MEQ PO TBCR
40.0000 meq | EXTENDED_RELEASE_TABLET | Freq: Every day | ORAL | Status: DC
  Administered 2020-08-13 – 2020-08-14 (×2): 40 meq via ORAL
  Filled 2020-08-13 (×2): qty 2

## 2020-08-13 MED ORDER — LACTATED RINGERS IV SOLN: INTRAVENOUS | Status: DC | PRN

## 2020-08-13 MED ORDER — PROPOFOL 10 MG/ML IV BOLUS
INTRAVENOUS | Status: AC
  Filled 2020-08-13: qty 40

## 2020-08-13 MED ORDER — LIDOCAINE HCL (PF) 2 % IJ SOLN
INTRAMUSCULAR | Status: DC | PRN
  Administered 2020-08-13: 60 mg via INTRADERMAL

## 2020-08-13 MED ORDER — LIDOCAINE HCL (PF) 2 % IJ SOLN
INTRAMUSCULAR | Status: AC
  Filled 2020-08-13: qty 5

## 2020-08-13 MED ORDER — PROPOFOL 500 MG/50ML IV EMUL
INTRAVENOUS | Status: DC | PRN
  Administered 2020-08-13: 150 ug/kg/min via INTRAVENOUS

## 2020-08-13 MED ORDER — PEG 3350-KCL-NA BICARB-NACL 420 G PO SOLR
4000.0000 mL | Freq: Once | ORAL | Status: AC
  Administered 2020-08-13: 4000 mL via ORAL

## 2020-08-13 NOTE — Anesthesia Postprocedure Evaluation (Signed)
Anesthesia Post Note  Patient: Theodore Mendoza  Procedure(s) Performed: ESOPHAGOGASTRODUODENOSCOPY (EGD) WITH PROPOFOL (N/A )  Patient location during evaluation: PACU Anesthesia Type: General Level of consciousness: awake and alert and oriented Pain management: pain level controlled Vital Signs Assessment: post-procedure vital signs reviewed and stable Respiratory status: spontaneous breathing and respiratory function stable Cardiovascular status: stable Postop Assessment: no apparent nausea or vomiting Anesthetic complications: no   No complications documented.   Last Vitals:  Vitals:   08/13/20 0907 08/13/20 0915  BP: 111/61 116/67  Pulse: 88 85  Resp: 16 16  Temp: 37.1 C   SpO2: 99% 96%    Last Pain:  Vitals:   08/13/20 0907  TempSrc:   PainSc: 0-No pain                 Winston Misner C Mayte Diers

## 2020-08-13 NOTE — Transfer of Care (Signed)
Immediate Anesthesia Transfer of Care Note  Patient: Theodore Mendoza  Procedure(s) Performed: ESOPHAGOGASTRODUODENOSCOPY (EGD) WITH PROPOFOL (N/A )  Patient Location: PACU  Anesthesia Type:General  Level of Consciousness: awake, alert , oriented and sedated  Airway & Oxygen Therapy: Patient Spontanous Breathing and Patient connected to nasal cannula oxygen  Post-op Assessment: Report given to RN and Post -op Vital signs reviewed and stable  Post vital signs: Reviewed and stable  Last Vitals:  Vitals Value Taken Time  BP 111/61 08/13/20 0908  Temp 37.1 C 08/13/20 0907  Pulse 85 08/13/20 0913  Resp 18 08/13/20 0913  SpO2 99 % 08/13/20 0913  Vitals shown include unvalidated device data.  Last Pain:  Vitals:   08/13/20 0907  TempSrc:   PainSc: 0-No pain      Patients Stated Pain Goal: 0 (49/70/26 3785)  Complications: No complications documented.

## 2020-08-13 NOTE — Progress Notes (Signed)
Patient Demographics:    Theodore Mendoza, is a 80 y.o. male, DOB - [birth year], [id number]  Admit date - 08/11/2020   Admitting Physician Murlean Iba, MD  Outpatient Primary MD for the patient is Theodore Brooklyn, FNP  LOS - 2   Chief Complaint  Patient presents with  . Hypotension        Subjective:    Theodore Mendoza was seen and examined this morning, awake alert oriented no acute distress, status post EGD.  No signs of bleeding over night Hemodynamically stable Tolerated procedure well.  Daughter and wife present at bedside -updated     Assessment  & Plan :    Principal Problem:   Rectal bleeding Active Problems:   Essential (primary) hypertension   Hyperlipidemia   History of TIA (transient ischemic attack)   Idiopathic peripheral neuropathy   Late effects of CVA (cerebrovascular accident)   Meningioma (HCC)   Paraplegia, incomplete (HCC)   Atrial fibrillation (HCC)   Symptomatic anemia   Abdominal aortic aneurysm (AAA) without rupture (HCC)   History of non-ST elevation myocardial infarction (NSTEMI)   Long term current use of anticoagulant   Acute blood loss anemia   Acute lower UTI   GBS (Guillain Barre syndrome) ([place])  Brief Summary:- 80 y.o. male with medical history significant paroxysmal atrial fibrillation,  aortic aneurysm, history of GI bleeding status post EGD 12/18/2019 that showed a large ulcerated bleeding gastric polyp status post removal with hot snare and placement of hemostatic clips x2 admitted on 08/11/2020 with concerns for GI bleed  ------------------------------------------------------------------------------------------------------------------------------------  A/p 1) Rectal Bleeding/acute GI bleed/H/o Ulcerated bleeding Gastric Polyp - -status post EGD 08/13/2020, essentially normal study with exception of 2 polyps in the gastric body without stigmata  or bleeding, clips noted at the prior polypectomy site at the gastric body without bleeding negative for any other abnormalities -GI planning for colonoscopy --- likely in a.m.   2) acute on chronic anemia secondary to ABLA--- EGD planned as above #1 -1 unit of PRBC transfused and 08/12/2020 for symptomatic anemia with hemoglobin down to 7.3 in the setting of ongoing GI bleed -iv Protonix as ordered -Hemoglobin 8.4, 7.3, 8.8 today  3)PAFib--- continue to Eliquis due to #1 and #2 above, continue to question  4)HTN- -remained stable, continue amlodipine 5 mg daily and lisinopril 5 mg daily, IV hydralazine as needed elevated BP  5)H/o CVA/CAD--- stable, continue to hold Plavix due to #1 #2, continue pravastatin  6)Presumed UTI --- patient had low-grade temps this morning, UA suspicious for UTI, IV Rocephin  -- Urine culture >>> no growth to date   7)BPH--stable, continue Flomax  8)AAA--stable, infrarenal continues to follow-up with vascular surgery, previously evaluated by  Dr. Trula Slade -reportedly operative intervention was recommended if aneurysm reaches 5 cm and is now 4.7 cm.  9)Neuromuscular deficits due to underlying Lonia Blood syndrome--at baseline patient gets around with a wheelchair  Disposition/Need for in-Hospital Stay- patient unable to be discharged at this time due to acute on chronic anemia due to GI bleed/acute blood loss requiring further endoluminal evaluation/EGD possibly on 08/13/2020 as well as presumed UTI requiring IV antibiotics  Status is: Inpatient  Remains inpatient appropriate because:Please see disposition above  Disposition: The patient is from: Home  Anticipated d/c is to: Home              Anticipated d/c date is: 2 days              Patient currently is not medically stable to d/c. Barriers: Not Clinically Stable-continue need for GI work-up  Code Status :  -  Code Status: Full Code   Family Communication:  (patient is alert, awake  and coherent), discussed with wife and daughter at bedside  Consults  :  Gi  DVT Prophylaxis  :   - SCDs  SCDs Start: 08/11/20 1350    Lab Results  Component Value Date   PLT 192 08/13/2020    Inpatient Medications  Scheduled Meds: . sodium chloride   Intravenous Once  . amLODipine  5 mg Oral Daily  . dofetilide  500 mcg Oral BID  . escitalopram  10 mg Oral QHS  . furosemide  20 mg Oral Daily  . gabapentin  600 mg Oral BID  . lisinopril  5 mg Oral Daily  . pantoprazole (PROTONIX) IV  40 mg Intravenous Q12H  . potassium chloride  40 mEq Oral Daily  . pravastatin  40 mg Oral q1800  . tamsulosin  0.4 mg Oral QPC supper  . vitamin B-12  1,000 mcg Oral Daily   Continuous Infusions: . sodium chloride 40 mL/hr at 08/12/20 2059  . cefTRIAXone (ROCEPHIN)  IV 1 g (08/12/20 1452)   PRN Meds:.acetaminophen **OR** acetaminophen, hydrALAZINE, ondansetron **OR** ondansetron (ZOFRAN) IV, traZODone    Anti-infectives (From admission, onward)   Start     Dose/Rate Route Frequency Ordered Stop   08/12/20 1515  cefTRIAXone (ROCEPHIN) 1 g in sodium chloride 0.9 % 100 mL IVPB        1 g 200 mL/hr over 30 Minutes Intravenous Every 24 hours 08/12/20 1416          Objective:   Vitals:   08/13/20 0446 08/13/20 0848 08/13/20 0907 08/13/20 0915  BP: (!) 144/65 (!) 146/68 111/61 116/67  Pulse: 77 85 88 85  Resp: 16 16 16 16   Temp: 97.9 F (36.6 C) 99.6 F (37.6 C) 98.8 F (37.1 C)   TempSrc: Oral Oral    SpO2: 96% 96% 99% 96%  Weight:      Height:        Wt Readings from Last 3 Encounters:  08/11/20 68 kg  05/23/20 78.5 kg  05/06/20 78.5 kg     Intake/Output Summary (Last 24 hours) at 08/13/2020 1211 Last data filed at 08/13/2020 1020 Gross per 24 hour  Intake 2548.24 ml  Output 1150 ml  Net 1398.24 ml     Physical Exam:   General:  Alert, oriented, cooperative, no distress;   HEENT:  Normocephalic, PERRL, otherwise with in Normal limits   Neuro:  CNII-XII intact. ,  normal motor and sensation, reflexes intact   Lungs:   Clear to auscultation BL, Respirations unlabored, no wheezes / crackles  Cardio:    S1/S2, RRR, No murmure, No Rubs or Gallops   Abdomen:   Soft, non-tender, bowel sounds active all four quadrants,  no guarding or peritoneal signs.  Muscular skeletal:  Limited exam - in bed, able to move all 4 extremities, Normal strength,  2+ pulses,  symmetric, No pitting edema  Skin:  Dry, warm to touch, negative for any Rashes,  Wounds: Please see nursing documentation           Data Review:   Micro Results Recent Results (  from the past 240 hour(s))  Resp Panel by RT-PCR (Flu A&B, Covid) Nasopharyngeal Swab     Status: None   Collection Time: 08/11/20  3:07 PM   Specimen: Nasopharyngeal Swab; Nasopharyngeal(NP) swabs in vial transport medium  Result Value Ref Range Status   SARS Coronavirus 2 by RT PCR NEGATIVE NEGATIVE Final    Comment: (NOTE) SARS-CoV-2 target nucleic acids are NOT DETECTED.  The SARS-CoV-2 RNA is generally detectable in upper respiratory specimens during the acute phase of infection. The lowest concentration of SARS-CoV-2 viral copies this assay can detect is 138 copies/mL. A negative result does not preclude SARS-Cov-2 infection and should not be used as the sole basis for treatment or other patient management decisions. A negative result may occur with  improper specimen collection/handling, submission of specimen other than nasopharyngeal swab, presence of viral mutation(s) within the areas targeted by this assay, and inadequate number of viral copies(<138 copies/mL). A negative result must be combined with clinical observations, patient history, and epidemiological information. The expected result is Negative.  Fact Sheet for Patients:  EntrepreneurPulse.com.au  Fact Sheet for Healthcare Providers:  IncredibleEmployment.be  This test is no t yet approved or cleared by the  [place] FDA and  has been authorized for detection and/or diagnosis of SARS-CoV-2 by FDA under an Emergency Use Authorization (EUA). This EUA will remain  in effect (meaning this test can be used) for the duration of the COVID-19 declaration under Section 564(b)(1) of the Act, 21 U.S.C.section 360bbb-3(b)(1), unless the authorization is terminated  or revoked sooner.       Influenza A by PCR NEGATIVE NEGATIVE Final   Influenza B by PCR NEGATIVE NEGATIVE Final    Comment: (NOTE) The Xpert Xpress SARS-CoV-2/FLU/RSV plus assay is intended as an aid in the diagnosis of influenza from Nasopharyngeal swab specimens and should not be used as a sole basis for treatment. Nasal washings and aspirates are unacceptable for Xpert Xpress SARS-CoV-2/FLU/RSV testing.  Fact Sheet for Patients: EntrepreneurPulse.com.au  Fact Sheet for Healthcare Providers: IncredibleEmployment.be  This test is not yet approved or cleared by the [place] FDA and has been authorized for detection and/or diagnosis of SARS-CoV-2 by FDA under an Emergency Use Authorization (EUA). This EUA will remain in effect (meaning this test can be used) for the duration of the COVID-19 declaration under Section 564(b)(1) of the Act, 21 U.S.C. section 360bbb-3(b)(1), unless the authorization is terminated or revoked.  Performed at [facility], [address] [place], [place] [address]   Culture, blood (Routine X 2) w Reflex to ID Panel     Status: None (Preliminary result)   Collection Time: 08/12/20  8:11 AM   Specimen: BLOOD RIGHT ARM  Result Value Ref Range Status   Specimen Description   Final    BLOOD RIGHT ARM BOTTLES DRAWN AEROBIC AND ANAEROBIC   Special Requests Blood Culture adequate volume  Final   Culture   Final    NO GROWTH 1 DAY Performed at [facility], [address], [place], [place] [address]    Report Status PENDING  Incomplete  Culture, blood (Routine X 2)  w Reflex to ID Panel     Status: None (Preliminary result)   Collection Time: 08/12/20  8:11 AM   Specimen: BLOOD RIGHT HAND  Result Value Ref Range Status   Specimen Description   Final    BLOOD RIGHT HAND BOTTLES DRAWN AEROBIC AND ANAEROBIC   Special Requests Blood Culture adequate volume  Final   Culture  Final    NO GROWTH 1 DAY Performed at [facility], [address] [place], [place] [address]    Report Status PENDING  Incomplete    Radiology Reports CT Head Wo Contrast  Result Date: 08/11/2020 CLINICAL DATA:  Intermittent dizziness.  Recent fall. EXAM: CT HEAD WITHOUT CONTRAST TECHNIQUE: Contiguous axial images were obtained from the base of the skull through the vertex without intravenous contrast. COMPARISON:  10/01/2015 FINDINGS: Brain: Again noted is the partially calcified extra-axial mass in the CP angle on the left most compatible with meningioma, measuring 3 cm, stable since prior study. Mass effect on the adjacent left cerebellar hemisphere. No midline shift. No acute infarct, hemorrhage or hydrocephalus. Mild chronic small vessel disease throughout the deep white matter. Vascular: No hyperdense vessel or unexpected calcification Skull: No calvarial abnormality. Sinuses/Orbits: Short air-fluid level in the left sphenoid sinus, new since prior study. Scattered mucosal thickening in the ethmoid air cells. Other: None IMPRESSION: No acute intracranial abnormality. Stable left CP angle meningioma. Mild acute on chronic sinusitis. Electronically Signed   By: Rolm Baptise M.D.   On: 08/11/2020 09:52   CT ABDOMEN PELVIS W CONTRAST  Result Date: 08/11/2020 CLINICAL DATA:  Melena, hematuria EXAM: CT ABDOMEN AND PELVIS WITH CONTRAST TECHNIQUE: Multidetector CT imaging of the abdomen and pelvis was performed using the standard protocol following bolus administration of intravenous contrast. CONTRAST:  128mL OMNIPAQUE IOHEXOL 300 MG/ML  SOLN COMPARISON:  03/17/2020 FINDINGS: Lower chest:  No acute abnormality. Hepatobiliary: No focal hepatic abnormality. Gallbladder unremarkable. Pancreas: Pancreatic atrophy. No focal abnormality or ductal dilatation. Spleen: No focal abnormality.  Normal size. Adrenals/Urinary Tract: Right renal parapelvic cysts. No hydronephrosis. Complex cyst in the lower pole of the right kidney measures 2.6 cm with peripheral calcifications, stable since prior study. No renal or ureteral stones. Urinary bladder decompressed, grossly unremarkable. Stomach/Bowel: Scattered colonic diverticulosis, most pronounced in the right colon. No active diverticulitis. Stomach and small bowel decompressed, unremarkable. No bowel obstruction. Vascular/Lymphatic: Heavily calcified aorta and iliac vessels. 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 Multiple penetrating ulcers are again noted in the abdominal aorta. No significant change in the appearance since prior study. No adenopathy. Reproductive: Mildly prominent prostate. Other: No free fluid or free air. Musculoskeletal: No acute bony abnormality. IMPRESSION: Colonic diverticulosis.  No active diverticulitis. Right renal cysts, stable.  No stones or hydronephrosis. Multiple penetrating ulcers in the abdominal aorta with pseudoaneurysm in the infrarenal abdominal aorta measuring up to 4.7 cm. Overall appearance is stable since prior study. Prostate enlargement. Electronically Signed   By: Rolm Baptise M.D.   On: 08/11/2020 12:53   DG CHEST PORT 1 VIEW  Result Date: 08/12/2020 CLINICAL DATA:  Fever EXAM: PORTABLE CHEST 1 VIEW COMPARISON:  Aug 11, 2020, March 17, 2020 FINDINGS: The cardiomediastinal silhouette is unchanged in contour.Atherosclerotic calcifications of the aorta. No pleural effusion. No pneumothorax. No acute pleuroparenchymal abnormality. Gaseous distension of bowel. Multilevel degenerative changes of the thoracic spine. IMPRESSION: No acute cardiopulmonary abnormality. Electronically Signed   By:  Valentino Saxon MD   On: 08/12/2020 15:21   DG Chest Port 1 View  Result Date: 08/11/2020 CLINICAL DATA:  Weakness EXAM: PORTABLE CHEST 1 VIEW COMPARISON:  October 02, 2015 chest radiograph; chest CT March 17, 2020 FINDINGS: Lungs are clear. Heart size and pulmonary vascularity are normal. No adenopathy. There is aortic atherosclerosis. No bone lesions. IMPRESSION: Lungs clear. Heart size normal. Aortic Atherosclerosis (ICD10-I70.0). Electronically Signed   By: Lowella Grip III M.D.  On: 08/11/2020 08:54     CBC Recent Labs  Lab 08/11/20 0838 08/11/20 1658 08/12/20 0458 08/13/20 0450  WBC 6.1  --  13.2* 6.7  HGB 8.4* 8.4* 7.3* 8.8*  HCT 27.2* 27.2* 24.8* 28.5*  PLT 250  --  236 192  MCV 93.2  --  95.4 93.4  MCH 28.8  --  28.1 28.9  MCHC 30.9  --  29.4* 30.9  RDW 14.1  --  14.5 14.6  LYMPHSABS 0.5*  --   --   --   MONOABS 0.3  --   --   --   EOSABS 0.1  --   --   --   BASOSABS 0.1  --   --   --     Chemistries  Recent Labs  Lab 08/11/20 0838 08/12/20 0458 08/13/20 0450  NA 143 142 141  K 4.3 3.7 3.5  CL 107 107 105  CO2 31 27 27   GLUCOSE 123* 102* 78  BUN 17 17 17   CREATININE 0.78 0.81 0.72  CALCIUM 8.9 8.3* 8.1*  MG  --  1.9 1.9  AST 12* 11* 12*  ALT 7 7 8   ALKPHOS 57 50 48  BILITOT 1.1 1.2 1.2   ------------------------------------------------------------------------------------------------------------------ No results for input(s): CHOL, HDL, LDLCALC, TRIG, CHOLHDL, LDLDIRECT in the last 72 hours.  Lab Results  Component Value Date   HGBA1C 4.7 05/06/2020   ------------------------------------------------------------------------------------------------------------------ No results for input(s): TSH, T4TOTAL, T3FREE, THYROIDAB in the last 72 hours.  Invalid input(s): FREET3 ------------------------------------------------------------------------------------------------------------------ No results for input(s): VITAMINB12, FOLATE, FERRITIN, TIBC,  IRON, RETICCTPCT in the last 72 hours.  Coagulation profile No results for input(s): INR, PROTIME in the last 168 hours.  No results for input(s): DDIMER in the last 72 hours.  Cardiac Enzymes No results for input(s): CKMB, TROPONINI, MYOGLOBIN in the last 168 hours.  Invalid input(s): CK ------------------------------------------------------------------------------------------------------------------ No results found for: BNP   Deatra James M.D on 08/13/2020 at 12:11 PM  Go to [email or web address] - for contact info  [facility] - Office  [phone]

## 2020-08-13 NOTE — Progress Notes (Signed)
Subjective:  Patient feels better this morning.  He was able to sleep some.  He had a bowel movement around 1:30 AM and probably passed was watery stool.  He denies nausea vomiting chest pain or shortness of breath.  He also denies abdominal pain.  He says his family would be coming to visit him this morning.   Current Medications:  Current Facility-Administered Medications:  .  0.9 %  sodium chloride infusion (Manually program via Guardrails IV Fluids), , Intravenous, Once, Reubin Milan, MD, Last Rate: 10 mL/hr at 08/12/20 0811, Rate Change at 08/12/20 0811 .  0.9 %  sodium chloride infusion, , Intravenous, Continuous, Emokpae, Courage, MD, Last Rate: 40 mL/hr at 08/12/20 2059, New Bag at 08/12/20 2059 .  acetaminophen (TYLENOL) tablet 650 mg, 650 mg, Oral, Q6H PRN, 650 mg at 08/12/20 2047 **OR** acetaminophen (TYLENOL) suppository 650 mg, 650 mg, Rectal, Q6H PRN, Johnson, Clanford L, MD .  amLODipine (NORVASC) tablet 5 mg, 5 mg, Oral, Daily, Johnson, Clanford L, MD, 5 mg at 08/13/20 0815 .  cefTRIAXone (ROCEPHIN) 1 g in sodium chloride 0.9 % 100 mL IVPB, 1 g, Intravenous, Q24H, Emokpae, Courage, MD, Last Rate: 200 mL/hr at 08/12/20 1452, 1 g at 08/12/20 1452 .  dofetilide (TIKOSYN) capsule 500 mcg, 500 mcg, Oral, BID, Johnson, Clanford L, MD, 500 mcg at 08/13/20 0814 .  escitalopram (LEXAPRO) tablet 10 mg, 10 mg, Oral, QHS, Johnson, Clanford L, MD, 10 mg at 08/13/20 0004 .  furosemide (LASIX) tablet 20 mg, 20 mg, Oral, Daily, Johnson, Clanford L, MD, 20 mg at 08/13/20 0815 .  gabapentin (NEURONTIN) capsule 600 mg, 600 mg, Oral, BID, Johnson, Clanford L, MD, 600 mg at 08/13/20 0815 .  hydrALAZINE (APRESOLINE) injection 5 mg, 5 mg, Intravenous, Q8H PRN, Johnson, Clanford L, MD .  lisinopril (ZESTRIL) tablet 5 mg, 5 mg, Oral, Daily, Johnson, Clanford L, MD, 5 mg at 08/13/20 0814 .  ondansetron (ZOFRAN) tablet 4 mg, 4 mg, Oral, Q6H PRN **OR** ondansetron (ZOFRAN) injection 4 mg, 4 mg,  Intravenous, Q6H PRN, Wynetta Emery, Clanford L, MD, 4 mg at 08/11/20 2119 .  pantoprazole (PROTONIX) injection 40 mg, 40 mg, Intravenous, Q12H, Rehman, Najeeb U, MD, 40 mg at 08/13/20 0816 .  potassium chloride SA (KLOR-CON) CR tablet 40 mEq, 40 mEq, Oral, Daily, Shahmehdi, Seyed A, MD, 40 mEq at 08/13/20 0815 .  pravastatin (PRAVACHOL) tablet 40 mg, 40 mg, Oral, q1800, Johnson, Clanford L, MD, 40 mg at 08/12/20 1732 .  tamsulosin (FLOMAX) capsule 0.4 mg, 0.4 mg, Oral, QPC supper, Johnson, Clanford L, MD, 0.4 mg at 08/12/20 1732 .  traZODone (DESYREL) tablet 25 mg, 25 mg, Oral, QHS PRN, Johnson, Clanford L, MD .  vitamin B-12 (CYANOCOBALAMIN) tablet 1,000 mcg, 1,000 mcg, Oral, Daily, Johnson, Clanford L, MD, 1,000 mcg at 08/13/20 0815   Objective: Blood pressure (!) 144/65, pulse 77, temperature 97.9 F (36.6 C), temperature source Oral, resp. rate 16, height 5' 8" (1.727 m), weight 68 kg, SpO2 96 %. Patient is alert and in no acute distress. Cardiac exam with regular rhythm normal S1 and S2.  He has faint systolic murmur at aortic area. Lungs are clear to auscultation. Abdomen is symmetrical.  Bowel sounds are normal.  No abdominal bruit noted.  On palpation abdomen is soft.  Biliary cannulation was easily palpable in midepigastric region above umbilicus.  No organomegaly. He has 2+ pitting edema involving distal half of the legs.  Edema is slightly more on the left side. He has bilateral  foot drop. He has he has atrophy to muscles of both hands.  Labs/studies Results:   CBC Latest Ref Rng & Units 08/13/2020 08/12/2020 08/11/2020  WBC 4.0 - 10.5 K/uL 6.7 13.2(H) -  Hemoglobin 13.0 - 17.0 g/dL 8.8(L) 7.3(L) 8.4(L)  Hematocrit 39.0 - 52.0 % 28.5(L) 24.8(L) 27.2(L)  Platelets 150 - 400 K/uL 192 236 -    CMP Latest Ref Rng & Units 08/13/2020 08/12/2020 08/11/2020  Glucose 70 - 99 mg/dL 78 102(H) 123(H)  BUN 8 - 23 mg/dL _0 Creatinine 0.61 - 1.24 mg/dL 0.72 0.81 0.78  Sodium 135 - 145 mmol/L  141 142 143  Potassium 3.5 - 5.1 mmol/L 3.5 3.7 4.3  Chloride 98 - 111 mmol/L 105 107 107  CO2 22 - 32 mmol/L _1 Calcium 8.9 - 10.3 mg/dL 8.1(L) 8.3(L) 8.9  Total Protein 6.5 - 8.1 g/dL 5.2(L) 5.3(L) 6.0(L)  Total Bilirubin 0.3 - 1.2 mg/dL 1.2 1.2 1.1  Alkaline Phos 38 - 126 U/L 48 50 57  AST 15 - 41 U/L 12(L) 11(L) 12(L)  ALT 0 - 44 U/L _2 Hepatic Function Latest Ref Rng & Units 08/13/2020 08/12/2020 08/11/2020  Total Protein 6.5 - 8.1 g/dL 5.2(L) 5.3(L) 6.0(L)  Albumin 3.5 - 5.0 g/dL 2.9(L) 3.1(L) 3.4(L)  AST 15 - 41 U/L 12(L) 11(L) 12(L)  ALT 0 - 44 U/L _3 Alk Phosphatase 38 - 126 U/L 48 50 57  Total Bilirubin 0.3 - 1.2 mg/dL 1.2 1.2 1.1     Urine culture is pending.  Blood cultures negative at 24 hours.  Chest film negative for pneumonia.  Assessment:  #1.  GI bleed.  No evidence of active GI bleed.  Hemoglobin is, up to 8.8 g after 1 unit of PRBCs.  Patient has been off apixaban and clopidogrel for more than 2 days.  Patient is stable to proceed with esophagogastroduodenoscopy.  If EGD is negative would consider colonoscopy.  #2.  Anemia secondary to GI bleed.  Discussed with Dr. Roxan Hockey.  Patient received 1 unit of PRBCs when temperature is down.  #3.  Fever.  Fever secondary to urinary tract infection.  Patient is on ceftriaxone.  #4.  History of coronary artery disease and paroxysmal atrial fibrillation.  Anticoagulation is on hold because of GI bleed.  #5.  Motor deficit secondary to history of Guillain-Barr syndrome.  Patient is wheelchair-bound.   Recommendations  Proceed with esophagogastroduodenoscopy. Patient is agreeable.

## 2020-08-13 NOTE — Op Note (Signed)
[facility] Patient Name: Theodore Mendoza Procedure Date: 08/13/2020 8:33 AM MRN: [id number] Date of Birth: November 22, 1940 Attending MD: Hildred Laser , MD CSN: [id number] Age: 80 Admit Type: Inpatient Procedure:                Upper GI endoscopy Indications:              Melena, Suspected upper gastrointestinal bleeding Providers:                Hildred Laser, MD, Janeece Riggers, RN, Casimer Bilis, Technician Referring MD:              Medicines:                Propofol per Anesthesia Complications:            No immediate complications. Estimated Blood Loss:     Estimated blood loss: none. Procedure:                Pre-Anesthesia Assessment:                           - Prior to the procedure, a History and Physical                            was performed, and patient medications and                            allergies were reviewed. The patient's tolerance of                            previous anesthesia was also reviewed. The risks                            and benefits of the procedure and the sedation                            options and risks were discussed with the patient.                            All questions were answered, and informed consent                            was obtained. Prior Anticoagulants: The patient                            last took Eliquis (apixaban) 3 days and Plavix                            (clopidogrel) 2 days prior to the procedure. ASA                            Grade Assessment: III - A patient with severe  systemic disease. After reviewing the risks and                            benefits, the patient was deemed in satisfactory                            condition to undergo the procedure.                           After obtaining informed consent, the endoscope was                            passed under direct vision. Throughout the                            procedure, the  patient's blood pressure, pulse, and                            oxygen saturations were monitored continuously. The                            GIF-H190 ([id number]) scope was introduced through the                            mouth, and advanced to the third part of duodenum.                            The upper GI endoscopy was accomplished without                            difficulty. The patient tolerated the procedure                            well. Scope In: 8:55:09 AM Scope Out: 9:00:35 AM Total Procedure Duration: 0 hours 5 minutes 26 seconds  Findings:      The hypopharynx was normal.      The examined esophagus was normal.      The Z-line was irregular and was found 41 cm from the incisors.      Two 4 to 7 mm pedunculated and sessile polyps with no bleeding and no       stigmata of recent bleeding were found in the gastric body.      Two clips on place at previous polypectomy site without stigmata of       bleed.      The exam of the stomach was otherwise normal.      The duodenal bulb, second portion of the duodenum and third portion of       the duodenum were normal. Impression:               - Normal hypopharynx.                           - Normal esophagus.                           - Z-line  irregular, 41 cm from the incisors.                           - Two gastric polyps.                           - Two clips on place at previous polypectomy site                            without stigmata of bleed.                           - Normal duodenal bulb, second portion of the                            duodenum and third portion of the duodenum.                           - No specimens collected. Moderate Sedation:      Per Anesthesia Care Recommendation:           - Return patient to hospital ward for ongoing care.                           - Clear liquid diet today.                           - Continue present medications.                           - Perform a colonoscopy  tomorrow. Procedure Code(s):        --- Professional ---                           [phone], Esophagogastroduodenoscopy, flexible,                            transoral; diagnostic, including collection of                            specimen(s) by brushing or washing, when performed                            (separate procedure) Diagnosis Code(s):        --- Professional ---                           K22.8, Other specified diseases of esophagus                           K31.7, Polyp of stomach and duodenum                           K92.1, Melena (includes Hematochezia) CPT copyright 2019 American Medical Association. All rights reserved. The codes documented in this report are preliminary and upon coder review may  be revised to meet current compliance requirements. Hildred Laser, MD Hildred Laser,  MD 08/13/2020 9:15:36 AM This report has been signed electronically. Number of Addenda: 0

## 2020-08-13 NOTE — Progress Notes (Signed)
Brief EGD note.  Normal hypopharyngeal mucosa. Normal mucosa in the esophagus and GE junction. GE junction located at 41 cm from the incisors. Stomach empty.  2 polyps or gastric body without stigmata of bleed. Clips noted at prior polypectomy site at gastric body along the lesser curvature without stigmata of bleed. No other abnormality noted involving stomach. Normal bulbar and post bulbar mucosa.

## 2020-08-14 ENCOUNTER — Inpatient Hospital Stay (HOSPITAL_COMMUNITY): Payer: Medicare Other | Admitting: Anesthesiology

## 2020-08-14 ENCOUNTER — Encounter (HOSPITAL_COMMUNITY)
Admission: EM | Disposition: A | Discharge status: Home / Self Care | Payer: Self-pay | Source: Home / Self Care | Attending: Family Medicine

## 2020-08-14 DIAGNOSIS — K648 Other hemorrhoids: Secondary | ICD-10-CM

## 2020-08-14 DIAGNOSIS — K5730 Diverticulosis of large intestine without perforation or abscess without bleeding: Secondary | ICD-10-CM

## 2020-08-14 HISTORY — PX: COLONOSCOPY WITH PROPOFOL: SHX5780

## 2020-08-14 LAB — COMPREHENSIVE METABOLIC PANEL
ALT: 9 U/L (ref 0–44)
AST: 11 U/L — ABNORMAL LOW (ref 15–41)
Albumin: 2.7 g/dL — ABNORMAL LOW (ref 3.5–5.0)
Alkaline Phosphatase: 46 U/L (ref 38–126)
Anion gap: 9 (ref 5–15)
BUN: 15 mg/dL (ref 8–23)
CO2: 27 mmol/L (ref 22–32)
Calcium: 8.1 mg/dL — ABNORMAL LOW (ref 8.9–10.3)
Chloride: 106 mmol/L (ref 98–111)
Creatinine, Ser: 0.63 mg/dL (ref 0.61–1.24)
GFR, Estimated: >60 mL/min (ref >60)
Glucose, Bld: 76 mg/dL (ref 70–99)
Potassium: 3.5 mmol/L (ref 3.5–5.1)
Sodium: 142 mmol/L (ref 135–145)
Total Bilirubin: 1.1 mg/dL (ref 0.3–1.2)
Total Protein: 4.8 g/dL — ABNORMAL LOW (ref 6.5–8.1)

## 2020-08-14 LAB — CBC
HCT: 27.4 % — ABNORMAL LOW (ref 39.0–52.0)
Hemoglobin: 8.6 g/dL — ABNORMAL LOW (ref 13.0–17.0)
MCH: 29.2 pg (ref 26.0–34.0)
MCHC: 31.4 g/dL (ref 30.0–36.0)
MCV: 92.9 fL (ref 80.0–100.0)
Platelets: 200 10*3/uL (ref 150–400)
RBC: 2.95 MIL/uL — ABNORMAL LOW (ref 4.22–5.81)
RDW: 14.4 % (ref 11.5–15.5)
WBC: 5.4 10*3/uL (ref 4.0–10.5)
nRBC: 0 % (ref 0.0–0.2)

## 2020-08-14 LAB — MAGNESIUM: Magnesium: 1.7 mg/dL (ref 1.7–2.4)

## 2020-08-14 SURGERY — COLONOSCOPY WITH PROPOFOL
Anesthesia: General

## 2020-08-14 MED ORDER — CIPROFLOXACIN HCL 500 MG PO TABS: 500.0000 mg | ORAL_TABLET | Freq: Two times a day (BID) | ORAL | 0 refills | Status: AC

## 2020-08-14 MED ORDER — STERILE WATER FOR IRRIGATION IR SOLN
Status: DC | PRN
  Administered 2020-08-14: 200 mL

## 2020-08-14 MED ORDER — PROPOFOL 500 MG/50ML IV EMUL
INTRAVENOUS | Status: DC | PRN
  Administered 2020-08-14: 150 ug/kg/min via INTRAVENOUS

## 2020-08-14 MED ORDER — LIDOCAINE HCL (CARDIAC) PF 100 MG/5ML IV SOSY
PREFILLED_SYRINGE | INTRAVENOUS | Status: DC | PRN
  Administered 2020-08-14: 60 mg via INTRAVENOUS

## 2020-08-14 MED ORDER — PROPOFOL 10 MG/ML IV BOLUS
INTRAVENOUS | Status: DC | PRN
  Administered 2020-08-14: 50 mg via INTRAVENOUS

## 2020-08-14 MED ORDER — PHENYLEPHRINE 40 MCG/ML (10ML) SYRINGE FOR IV PUSH (FOR BLOOD PRESSURE SUPPORT)
PREFILLED_SYRINGE | INTRAVENOUS | Status: DC | PRN
  Administered 2020-08-14: 80 ug via INTRAVENOUS

## 2020-08-14 MED ORDER — LACTATED RINGERS IV SOLN: INTRAVENOUS | Status: DC | PRN

## 2020-08-14 NOTE — Progress Notes (Signed)
Patient Demographics:    Theodore Mendoza, is a 80 y.o. male, DOB - 1940-07-11, [id number]  Admit date - 08/11/2020   Admitting Physician Murlean Iba, MD  Outpatient Primary MD for the patient is Loman Brooklyn, FNP  LOS - 3   Chief Complaint  Patient presents with  . Hypotension        Subjective:    Theodore Mendoza was seen and examined this morning, overnight prep for colonoscopy this a.m. No issues overnight. Status post EGD yesterday. No signs of acute rectal bleed. Hemodynamically stable   Assessment  & Plan :    Principal Problem:   Rectal bleeding Active Problems:   Essential (primary) hypertension   Hyperlipidemia   History of TIA (transient ischemic attack)   Idiopathic peripheral neuropathy   Late effects of CVA (cerebrovascular accident)   Meningioma (HCC)   Paraplegia, incomplete (HCC)   Atrial fibrillation (HCC)   Symptomatic anemia   Abdominal aortic aneurysm (AAA) without rupture (HCC)   History of non-ST elevation myocardial infarction (NSTEMI)   Long term current use of anticoagulant   Acute blood loss anemia   Acute lower UTI   GBS (Guillain Barre syndrome) ([place])  Brief Summary:- 80 y.o. male with medical history significant paroxysmal atrial fibrillation,  aortic aneurysm, history of GI bleeding status post EGD 12/18/2019 that showed a large ulcerated bleeding gastric polyp status post removal with hot snare and placement of hemostatic clips x2 admitted on 08/11/2020 with concerns for GI bleed  ------------------------------------------------------------------------------------------------------------------------------------  A/p 1) Rectal Bleeding/acute GI bleed/H/o Ulcerated bleeding Gastric Polyp - -status post EGD 08/13/2020, essentially normal study with exception of 2 polyps in the gastric body without stigmata or bleeding, clips noted at the prior  polypectomy site at the gastric body without bleeding negative for any other abnormalities -Colonoscopy 08/14/2020 GI recommendation: -Resuming cardiac diet, resuming anticoagulation tomorrow 08/15/2020 Finding consistent with hemorrhoids, diverticulosis of large intestine without perforation or abscess without bleeding    2) acute on chronic anemia secondary to ABLA--- -Status post EGD and colonoscopy as above -1 unit of PRBC transfused and 08/12/2020 for symptomatic anemia with hemoglobin down to 7.3 in the setting of ongoing GI bleed -iv Protonix as ordered -Hemoglobin 8.4, 7.3, 8.8 >> 8.6 today  3)PAFib--- continue to Eliquis due to #1 and #2 above, continue to question  4)HTN- -stable, we will continue amlodipine 5 mg daily and lisinopril 5 mg daily, IV hydralazine as needed elevated BP  5)H/o CVA/CAD--- stable, continue to hold Plavix due to #1 #2, continue pravastatin  6)Presumed UTI --- patient had low-grade temps this morning, UA suspicious for UTI, IV Rocephin  -- Urine culture >>>  > 100 K gram-negative rods   7)BPH--stable, continue Flomax  8)AAA--stable, infrarenal continues to follow-up with vascular surgery, previously evaluated by  Dr. Trula Slade -reportedly operative intervention was recommended if aneurysm reaches 5 cm and is now 4.7 cm.  9)Neuromuscular deficits due to underlying -Lonia Blood syndrome--at baseline patient gets around with a wheelchair  Disposition/Need for in-Hospital Stay- patient unable to be discharged at this time due to acute on chronic anemia due to GI bleed/acute blood loss requiring further endoluminal evaluation/EGD possibly on 08/13/2020 as well as presumed UTI requiring IV antibiotics  Status is: Inpatient  Remains inpatient appropriate because:Please see disposition above  Disposition: The patient is from: Home              Anticipated d/c is to: Home              Anticipated d/c date is: 2 days              Patient currently is not  medically stable to d/c. Barriers: Not Clinically Stable-continue need for GI work-up  Code Status :  -  Code Status: Full Code   Family Communication:  (patient is alert, awake and coherent), discussed with wife and daughter at bedside  Consults  :  Gi  DVT Prophylaxis  :   - SCDs  SCDs Start: 08/11/20 1350    Lab Results  Component Value Date   PLT 200 08/14/2020    Inpatient Medications  Scheduled Meds: . sodium chloride   Intravenous Once  . amLODipine  5 mg Oral Daily  . dofetilide  500 mcg Oral BID  . escitalopram  10 mg Oral QHS  . furosemide  20 mg Oral Daily  . gabapentin  600 mg Oral BID  . lisinopril  5 mg Oral Daily  . pantoprazole (PROTONIX) IV  40 mg Intravenous Q12H  . potassium chloride  40 mEq Oral Daily  . pravastatin  40 mg Oral q1800  . tamsulosin  0.4 mg Oral QPC supper  . vitamin B-12  1,000 mcg Oral Daily   Continuous Infusions: . cefTRIAXone (ROCEPHIN)  IV 1 g (08/13/20 1603)   PRN Meds:.acetaminophen **OR** acetaminophen, hydrALAZINE, ondansetron **OR** ondansetron (ZOFRAN) IV, traZODone    Anti-infectives (From admission, onward)   Start     Dose/Rate Route Frequency Ordered Stop   08/12/20 1515  cefTRIAXone (ROCEPHIN) 1 g in sodium chloride 0.9 % 100 mL IVPB        1 g 200 mL/hr over 30 Minutes Intravenous Every 24 hours 08/12/20 1416          Objective:   Vitals:   08/14/20 0541 08/14/20 0924 08/14/20 1016 08/14/20 1048  BP: (!) 162/74 (!) 157/86 (!) 130/55 137/69  Pulse: 74  77 71  Resp: 18 17 10 18   Temp: 98.6 F (37 C) 98.2 F (36.8 C) 97.9 F (36.6 C) 98.1 F (36.7 C)  TempSrc:  Oral  Oral  SpO2: 95% 94% 98% 97%  Weight:      Height:        Wt Readings from Last 3 Encounters:  08/11/20 68 kg  05/23/20 78.5 kg  05/06/20 78.5 kg     Intake/Output Summary (Last 24 hours) at 08/14/2020 1107 Last data filed at 08/14/2020 1052 Gross per 24 hour  Intake 1420 ml  Output 700 ml  Net 720 ml       Physical Exam:    General:  Alert, oriented, cooperative, no distress;   HEENT:  Normocephalic, PERRL, otherwise with in Normal limits   Neuro:  CNII-XII intact. , normal motor and sensation, reflexes intact   Lungs:   Clear to auscultation BL, Respirations unlabored, no wheezes / crackles  Cardio:    S1/S2, RRR, No murmure, No Rubs or Gallops   Abdomen:   Soft, non-tender, bowel sounds active all four quadrants,  no guarding or peritoneal signs.  Muscular skeletal:  Limited exam - in bed, able to move all 4 extremities, Normal strength,  2+ pulses,  symmetric, No pitting edema  Skin:  Dry, warm to touch, negative for any Rashes,  Wounds: Please see nursing documentation            Data Review:   Micro Results Recent Results (from the past 240 hour(s))  Resp Panel by RT-PCR (Flu A&B, Covid) Nasopharyngeal Swab     Status: None   Collection Time: 08/11/20  3:07 PM   Specimen: Nasopharyngeal Swab; Nasopharyngeal(NP) swabs in vial transport medium  Result Value Ref Range Status   SARS Coronavirus 2 by RT PCR NEGATIVE NEGATIVE Final    Comment: (NOTE) SARS-CoV-2 target nucleic acids are NOT DETECTED.  The SARS-CoV-2 RNA is generally detectable in upper respiratory specimens during the acute phase of infection. The lowest concentration of SARS-CoV-2 viral copies this assay can detect is 138 copies/mL. A negative result does not preclude SARS-Cov-2 infection and should not be used as the sole basis for treatment or other patient management decisions. A negative result may occur with  improper specimen collection/handling, submission of specimen other than nasopharyngeal swab, presence of viral mutation(s) within the areas targeted by this assay, and inadequate number of viral copies(<138 copies/mL). A negative result must be combined with clinical observations, patient history, and epidemiological information. The expected result is Negative.  Fact Sheet for Patients:   EntrepreneurPulse.com.au  Fact Sheet for Healthcare Providers:  IncredibleEmployment.be  This test is no t yet approved or cleared by the [place] FDA and  has been authorized for detection and/or diagnosis of SARS-CoV-2 by FDA under an Emergency Use Authorization (EUA). This EUA will remain  in effect (meaning this test can be used) for the duration of the COVID-19 declaration under Section 564(b)(1) of the Act, 21 U.S.C.section 360bbb-3(b)(1), unless the authorization is terminated  or revoked sooner.       Influenza A by PCR NEGATIVE NEGATIVE Final   Influenza B by PCR NEGATIVE NEGATIVE Final    Comment: (NOTE) The Xpert Xpress SARS-CoV-2/FLU/RSV plus assay is intended as an aid in the diagnosis of influenza from Nasopharyngeal swab specimens and should not be used as a sole basis for treatment. Nasal washings and aspirates are unacceptable for Xpert Xpress SARS-CoV-2/FLU/RSV testing.  Fact Sheet for Patients: EntrepreneurPulse.com.au  Fact Sheet for Healthcare Providers: IncredibleEmployment.be  This test is not yet approved or cleared by the [place] FDA and has been authorized for detection and/or diagnosis of SARS-CoV-2 by FDA under an Emergency Use Authorization (EUA). This EUA will remain in effect (meaning this test can be used) for the duration of the COVID-19 declaration under Section 564(b)(1) of the Act, 21 U.S.C. section 360bbb-3(b)(1), unless the authorization is terminated or revoked.  Performed at [facility], [address] [place], [place] [address]   Culture, blood (Routine X 2) w Reflex to ID Panel     Status: None (Preliminary result)   Collection Time: 08/12/20  8:11 AM   Specimen: BLOOD RIGHT ARM  Result Value Ref Range Status   Specimen Description   Final    BLOOD RIGHT ARM BOTTLES DRAWN AEROBIC AND ANAEROBIC   Special Requests Blood Culture adequate volume   Final   Culture   Final    NO GROWTH 1 DAY Performed at [facility], [address] [place], [place] [address]    Report Status PENDING  Incomplete  Culture, blood (Routine X 2) w Reflex to ID Panel     Status: None (Preliminary result)   Collection Time: 08/12/20  8:11 AM   Specimen: BLOOD RIGHT HAND  Result Value Ref Range Status   Specimen Description   Final  BLOOD RIGHT HAND BOTTLES DRAWN AEROBIC AND ANAEROBIC   Special Requests Blood Culture adequate volume  Final   Culture   Final    NO GROWTH 1 DAY Performed at [facility], [address] [place], [place] [address]    Report Status PENDING  Incomplete  Urine Culture     Status: Abnormal (Preliminary result)   Collection Time: 08/12/20  8:34 AM   Specimen: Urine, Clean Catch  Result Value Ref Range Status   Specimen Description   Final    URINE, CLEAN CATCH Performed at [facility], [address], [place], [place] [address]    Special Requests   Final    Normal Performed at [facility], [address], [place], [place] [address]    Culture (A)  Final    >=100,000 COLONIES/mL GRAM NEGATIVE RODS SUSCEPTIBILITIES TO FOLLOW Performed at [facility], [place] [address], [place], [place] [address]    Report Status PENDING  Incomplete    Radiology Reports CT Head Wo Contrast  Result Date: 08/11/2020 CLINICAL DATA:  Intermittent dizziness.  Recent fall. EXAM: CT HEAD WITHOUT CONTRAST TECHNIQUE: Contiguous axial images were obtained from the base of the skull through the vertex without intravenous contrast. COMPARISON:  10/01/2015 FINDINGS: Brain: Again noted is the partially calcified extra-axial mass in the CP angle on the left most compatible with meningioma, measuring 3 cm, stable since prior study. Mass effect on the adjacent left cerebellar hemisphere. No midline shift. No acute infarct, hemorrhage or hydrocephalus. Mild chronic small vessel disease throughout the deep white matter. Vascular: No hyperdense vessel  or unexpected calcification Skull: No calvarial abnormality. Sinuses/Orbits: Short air-fluid level in the left sphenoid sinus, new since prior study. Scattered mucosal thickening in the ethmoid air cells. Other: None IMPRESSION: No acute intracranial abnormality. Stable left CP angle meningioma. Mild acute on chronic sinusitis. Electronically Signed   By: Rolm Baptise M.D.   On: 08/11/2020 09:52   CT ABDOMEN PELVIS W CONTRAST  Result Date: 08/11/2020 CLINICAL DATA:  Melena, hematuria EXAM: CT ABDOMEN AND PELVIS WITH CONTRAST TECHNIQUE: Multidetector CT imaging of the abdomen and pelvis was performed using the standard protocol following bolus administration of intravenous contrast. CONTRAST:  113mL OMNIPAQUE IOHEXOL 300 MG/ML  SOLN COMPARISON:  03/17/2020 FINDINGS: Lower chest: No acute abnormality. Hepatobiliary: No focal hepatic abnormality. Gallbladder unremarkable. Pancreas: Pancreatic atrophy. No focal abnormality or ductal dilatation. Spleen: No focal abnormality.  Normal size. Adrenals/Urinary Tract: Right renal parapelvic cysts. No hydronephrosis. Complex cyst in the lower pole of the right kidney measures 2.6 cm with peripheral calcifications, stable since prior study. No renal or ureteral stones. Urinary bladder decompressed, grossly unremarkable. Stomach/Bowel: Scattered colonic diverticulosis, most pronounced in the right colon. No active diverticulitis. Stomach and small bowel decompressed, unremarkable. No bowel obstruction. Vascular/Lymphatic: Heavily calcified aorta and iliac vessels. 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 Multiple penetrating ulcers are again noted in the abdominal aorta. No significant change in the appearance since prior study. No adenopathy. Reproductive: Mildly prominent prostate. Other: No free fluid or free air. Musculoskeletal: No acute bony abnormality. IMPRESSION: Colonic diverticulosis.  No active diverticulitis. Right renal cysts, stable.  No  stones or hydronephrosis. Multiple penetrating ulcers in the abdominal aorta with pseudoaneurysm in the infrarenal abdominal aorta measuring up to 4.7 cm. Overall appearance is stable since prior study. Prostate enlargement. Electronically Signed   By: Rolm Baptise M.D.   On: 08/11/2020 12:53   DG CHEST PORT 1 VIEW  Result Date: 08/12/2020 CLINICAL  DATA:  Fever EXAM: PORTABLE CHEST 1 VIEW COMPARISON:  Aug 11, 2020, March 17, 2020 FINDINGS: The cardiomediastinal silhouette is unchanged in contour.Atherosclerotic calcifications of the aorta. No pleural effusion. No pneumothorax. No acute pleuroparenchymal abnormality. Gaseous distension of bowel. Multilevel degenerative changes of the thoracic spine. IMPRESSION: No acute cardiopulmonary abnormality. Electronically Signed   By: Valentino Saxon MD   On: 08/12/2020 15:21   DG Chest Port 1 View  Result Date: 08/11/2020 CLINICAL DATA:  Weakness EXAM: PORTABLE CHEST 1 VIEW COMPARISON:  October 02, 2015 chest radiograph; chest CT March 17, 2020 FINDINGS: Lungs are clear. Heart size and pulmonary vascularity are normal. No adenopathy. There is aortic atherosclerosis. No bone lesions. IMPRESSION: Lungs clear. Heart size normal. Aortic Atherosclerosis (ICD10-I70.0). Electronically Signed   By: Lowella Grip III M.D.   On: 08/11/2020 08:54     CBC Recent Labs  Lab 08/11/20 0838 08/11/20 1658 08/12/20 0458 08/13/20 0450 08/14/20 0448  WBC 6.1  --  13.2* 6.7 5.4  HGB 8.4* 8.4* 7.3* 8.8* 8.6*  HCT 27.2* 27.2* 24.8* 28.5* 27.4*  PLT 250  --  236 192 200  MCV 93.2  --  95.4 93.4 92.9  MCH 28.8  --  28.1 28.9 29.2  MCHC 30.9  --  29.4* 30.9 31.4  RDW 14.1  --  14.5 14.6 14.4  LYMPHSABS 0.5*  --   --   --   --   MONOABS 0.3  --   --   --   --   EOSABS 0.1  --   --   --   --   BASOSABS 0.1  --   --   --   --     Chemistries  Recent Labs  Lab 08/11/20 0838 08/12/20 0458 08/13/20 0450 08/14/20 0448  NA 143 142 141 142  K 4.3 3.7 3.5 3.5   CL 107 107 105 106  CO2 31 27 27 27   GLUCOSE 123* 102* 78 76  BUN 17 17 17 15   CREATININE 0.78 0.81 0.72 0.63  CALCIUM 8.9 8.3* 8.1* 8.1*  MG  --  1.9 1.9 1.7  AST 12* 11* 12* 11*  ALT 7 7 8 9   ALKPHOS 57 50 48 46  BILITOT 1.1 1.2 1.2 1.1   ------------------------------------------------------------------------------------------------------------------ No results for input(s): CHOL, HDL, LDLCALC, TRIG, CHOLHDL, LDLDIRECT in the last 72 hours.  Lab Results  Component Value Date   HGBA1C 4.7 05/06/2020   ------------------------------------------------------------------------------------------------------------------ No results for input(s): TSH, T4TOTAL, T3FREE, THYROIDAB in the last 72 hours.  Invalid input(s): FREET3 ------------------------------------------------------------------------------------------------------------------ No results for input(s): VITAMINB12, FOLATE, FERRITIN, TIBC, IRON, RETICCTPCT in the last 72 hours.  Coagulation profile No results for input(s): INR, PROTIME in the last 168 hours.  No results for input(s): DDIMER in the last 72 hours.  Cardiac Enzymes No results for input(s): CKMB, TROPONINI, MYOGLOBIN in the last 168 hours.  Invalid input(s): CK ------------------------------------------------------------------------------------------------------------------ No results found for: BNP   Deatra James M.D on 08/14/2020 at 11:07 AM  Go to [email or web address] - for contact info  [facility] - Office  [phone]

## 2020-08-14 NOTE — Anesthesia Postprocedure Evaluation (Signed)
Anesthesia Post Note  Patient: Theodore Mendoza  Procedure(s) Performed: COLONOSCOPY WITH PROPOFOL (N/A )  Patient location during evaluation: Nursing Unit Anesthesia Type: General Level of consciousness: awake and alert and oriented Pain management: pain level controlled Vital Signs Assessment: post-procedure vital signs reviewed and stable Respiratory status: spontaneous breathing and respiratory function stable Cardiovascular status: blood pressure returned to baseline and stable Postop Assessment: no apparent nausea or vomiting Anesthetic complications: no   No complications documented.   Last Vitals:  Vitals:   08/14/20 1016 08/14/20 1048  BP: (!) 130/55 137/69  Pulse: 77 71  Resp: 10 18  Temp: 36.6 C 36.7 C  SpO2: 98% 97%    Last Pain:  Vitals:   08/14/20 1048  TempSrc: Oral  PainSc:                  Carmen Vallecillo C Editha Bridgeforth

## 2020-08-14 NOTE — Discharge Summary (Signed)
Physician Discharge Summary [facility]    Patient: Theodore Mendoza [facility]                   Admit date: 08/11/2020   DOB: [birth year]             Discharge date:08/14/2020/11:54 AM [id number]                          PCP: Loman Brooklyn, FNP  Disposition: HOME  Recommendations for Outpatient Follow-up:   Follow up: In 2-3 weeks Follow-up with gastroenterologist Dr. Laural Golden in 2-3 weeks The patient is to resume anticoagulation medication Eliquis on 08/15/2020 per recommendation  Discharge Condition: Stable   Code Status:   Code Status: Full Code  Diet recommendation: Cardiac diet   Discharge Diagnoses:    Principal Problem:   Rectal bleeding Active Problems:   Essential (primary) hypertension   Hyperlipidemia   History of TIA (transient ischemic attack)   Idiopathic peripheral neuropathy   Late effects of CVA (cerebrovascular accident)   Meningioma ([place])   Paraplegia, incomplete ([place])   Atrial fibrillation ([place])   Symptomatic anemia   Abdominal aortic aneurysm (AAA) without rupture ([place])   History of non-ST elevation myocardial infarction (NSTEMI)   Long term current use of anticoagulant   Acute blood loss anemia   Acute lower UTI   GBS (Guillain Barre syndrome) ([place])   History of Present Illness/ Hospital Course Kathleen Argue Summary:    Brief Summary:- 80 y.o.malewith medical history significantparoxysmal atrial fibrillation, aortic aneurysm, history of GI bleeding status post EGD 12/18/2019 that showed a large ulcerated bleeding gastric polyp status post removal with hot snare and placement of hemostatic clips x2 admitted on 08/11/2020 with concerns for GI bleed  ------------------------------------------------------------------------------------------------------------------------------------   1) Rectal Bleeding/acute GI bleed/H/o Ulcerated bleeding Gastric Polyp - -status post EGD 08/13/2020:     essentially normal study with exception of 2 polyps in the  gastric body without stigmata or bleeding, clips noted at the prior polypectomy site at the gastric body without bleeding negative for any other abnormalities  -Colonoscopy 08/14/2020    GI recommendation: -Resuming cardiac diet, resuming anticoagulation tomorrow 08/15/2020 Finding consistent with hemorrhoids, diverticulosis of large intestine without perforation or abscess without bleeding  -Dr. Corbin Ade has recommended patient to be discharged home, resume anticoagulation in a.m. Continue current [place] of Protonix   2) acute on chronic anemia secondary to ABLA--- -Status post EGD and colonoscopy as above -1 unit of PRBC transfused and 08/12/2020 for symptomatic anemia with hemoglobin down to 7.3 in the setting of ongoing GI bleed -iv Protonix .... Switched to p.o. -Hemoglobin 8.4, 7.3, 8.8 >> 8.6 today  3)PAFib--- May Eliquis on 08/15/2020  4)HTN- -stable, we will continue amlodipine 5 mg daily and lisinopril 5 mg daily, IV hydralazine as needed elevated BP  5)H/o CVA/CAD--- stable, continue Eliquis, hold Plavix (to be discussed with cardiologist if possible can be just switched to aspirin)  continue pravastatin  6)Presumed UTI --- patient had low-grade temps this morning, UA suspicious for UTI, IV Rocephin  -- Urine culture >>>  > 100 K gram-negative rods -IV Rocephin has been switched to p.o. ciprofloxacin   7)BPH--stable, continue Flomax  8)AAA--stable, infrarenal continues to follow-up with vascular surgery, previously evaluated by  Dr. Trula Slade -reportedly operative intervention was recommended ifaneurysm reaches 5 cm and is now 4.7 cm.  9)Neuromuscular deficits due to underlying -Lonia Blood syndrome--at baseline patient gets around with a wheelchair  Remains inpatient appropriate because:Please see disposition above  Disposition: The patient is from: Home  Anticipated d/c is to: Home   Code Status :  -  Code Status: Full Code    Family Communication:  (patient is alert, awake and coherent), discussed with wife and daughter at bedside  Consults  :  Gi   Discharge Instructions:   Discharge Instructions    Activity as tolerated - No restrictions   Complete by: As directed    Call MD for:  difficulty breathing, headache or visual disturbances   Complete by: As directed    Call MD for:  persistant dizziness or light-headedness   Complete by: As directed    Call MD for:  persistant nausea and vomiting   Complete by: As directed    Call MD for:  temperature >100.4   Complete by: As directed    Diet - low sodium heart healthy   Complete by: As directed    Discharge instructions   Complete by: As directed    Per gastroenterologist, resume your blood thinners in AM.  Monitor your stool for any blood loss. Your iron supplements. Advance your diet slowly... Gastroenterologist would like to follow-up with you as an outpatient in 1-2 wks   Increase activity slowly   Complete by: As directed        Medication List    TAKE these medications   amLODipine 5 MG tablet Commonly known as: NORVASC TAKE 1 TABLET BY MOUTH DAILY.   apixaban 5 MG Tabs tablet Commonly known as: Eliquis Take 1 tablet (5 mg total) by mouth 2 (two) times daily.   ciprofloxacin 500 MG tablet Commonly known as: Cipro Take 1 tablet (500 mg total) by mouth 2 (two) times daily for 7 days.   clopidogrel 75 MG tablet Commonly known as: PLAVIX TAKE 1 TABLET ONCE DAILY   dofetilide 500 MCG capsule Commonly known as: Tikosyn Take 1 capsule (500 mcg total) by mouth 2 (two) times daily.   escitalopram 10 MG tablet Commonly known as: LEXAPRO TAKE 1 TABLET AT BEDTIME   furosemide 20 MG tablet Commonly known as: LASIX Take 1 tablet (20 mg total) by mouth daily.   gabapentin 300 MG capsule Commonly known as: NEURONTIN TAKE 1 CAPSULE TWICE DAILY AND 2 AT BEDTIME What changed: See the new instructions.   Iron 325 (65 Fe) MG  Tabs Take 1 tablet by mouth daily.   lisinopril 5 MG tablet Commonly known as: ZESTRIL TAKE 1 TABLET ONCE DAILY   pantoprazole 40 MG tablet Commonly known as: PROTONIX TAKE  (1)  TABLET TWICE A DAY. What changed: See the new instructions.   pravastatin 40 MG tablet Commonly known as: PRAVACHOL TAKE 1 TABLET ONCE DAILY WITH LUNCH What changed: See the new instructions.   sucralfate 1 g tablet Commonly known as: CARAFATE TAKE  (1)  TABLET  FOUR TIMES DAILY. What changed: See the new instructions.   tamsulosin 0.4 MG Caps capsule Commonly known as: FLOMAX TAKE 1 CAPSULE ONCE DAILY   vitamin B-12 1000 MCG tablet Commonly known as: CYANOCOBALAMIN Take 1 tablet (1,000 mcg total) by mouth daily.       Allergies  Allergen Reactions  . Hydrocodone-Acetaminophen   . Morphine And Related Other (See Comments)    Reaction:  Confusion/weakness/hallucinations  . Valsartan Rash     Procedures /Studies:   CT Head Wo Contrast  Result Date: 08/11/2020 CLINICAL DATA:  Intermittent dizziness.  Recent fall. EXAM: CT HEAD WITHOUT CONTRAST TECHNIQUE: Contiguous  axial images were obtained from the base of the skull through the vertex without intravenous contrast. COMPARISON:  10/01/2015 FINDINGS: Brain: Again noted is the partially calcified extra-axial mass in the CP angle on the left most compatible with meningioma, measuring 3 cm, stable since prior study. Mass effect on the adjacent left cerebellar hemisphere. No midline shift. No acute infarct, hemorrhage or hydrocephalus. Mild chronic small vessel disease throughout the deep white matter. Vascular: No hyperdense vessel or unexpected calcification Skull: No calvarial abnormality. Sinuses/Orbits: Short air-fluid level in the left sphenoid sinus, new since prior study. Scattered mucosal thickening in the ethmoid air cells. Other: None IMPRESSION: No acute intracranial abnormality. Stable left CP angle meningioma. Mild acute on chronic  sinusitis. Electronically Signed   By: Rolm Baptise M.D.   On: 08/11/2020 09:52   CT ABDOMEN PELVIS W CONTRAST  Result Date: 08/11/2020 CLINICAL DATA:  Melena, hematuria EXAM: CT ABDOMEN AND PELVIS WITH CONTRAST TECHNIQUE: Multidetector CT imaging of the abdomen and pelvis was performed using the standard protocol following bolus administration of intravenous contrast. CONTRAST:  171mL OMNIPAQUE IOHEXOL 300 MG/ML  SOLN COMPARISON:  03/17/2020 FINDINGS: Lower chest: No acute abnormality. Hepatobiliary: No focal hepatic abnormality. Gallbladder unremarkable. Pancreas: Pancreatic atrophy. No focal abnormality or ductal dilatation. Spleen: No focal abnormality.  Normal size. Adrenals/Urinary Tract: Right renal parapelvic cysts. No hydronephrosis. Complex cyst in the lower pole of the right kidney measures 2.6 cm with peripheral calcifications, stable since prior study. No renal or ureteral stones. Urinary bladder decompressed, grossly unremarkable. Stomach/Bowel: Scattered colonic diverticulosis, most pronounced in the right colon. No active diverticulitis. Stomach and small bowel decompressed, unremarkable. No bowel obstruction. Vascular/Lymphatic: Heavily calcified aorta and iliac vessels. 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 Multiple penetrating ulcers are again noted in the abdominal aorta. No significant change in the appearance since prior study. No adenopathy. Reproductive: Mildly prominent prostate. Other: No free fluid or free air. Musculoskeletal: No acute bony abnormality. IMPRESSION: Colonic diverticulosis.  No active diverticulitis. Right renal cysts, stable.  No stones or hydronephrosis. Multiple penetrating ulcers in the abdominal aorta with pseudoaneurysm in the infrarenal abdominal aorta measuring up to 4.7 cm. Overall appearance is stable since prior study. Prostate enlargement. Electronically Signed   By: Rolm Baptise M.D.   On: 08/11/2020 12:53   DG CHEST PORT 1  VIEW  Result Date: 08/12/2020 CLINICAL DATA:  Fever EXAM: PORTABLE CHEST 1 VIEW COMPARISON:  Aug 11, 2020, March 17, 2020 FINDINGS: The cardiomediastinal silhouette is unchanged in contour.Atherosclerotic calcifications of the aorta. No pleural effusion. No pneumothorax. No acute pleuroparenchymal abnormality. Gaseous distension of bowel. Multilevel degenerative changes of the thoracic spine. IMPRESSION: No acute cardiopulmonary abnormality. Electronically Signed   By: Valentino Saxon MD   On: 08/12/2020 15:21   DG Chest Port 1 View  Result Date: 08/11/2020 CLINICAL DATA:  Weakness EXAM: PORTABLE CHEST 1 VIEW COMPARISON:  October 02, 2015 chest radiograph; chest CT March 17, 2020 FINDINGS: Lungs are clear. Heart size and pulmonary vascularity are normal. No adenopathy. There is aortic atherosclerosis. No bone lesions. IMPRESSION: Lungs clear. Heart size normal. Aortic Atherosclerosis (ICD10-I70.0). Electronically Signed   By: Lowella Grip III M.D.   On: 08/11/2020 08:54     Subjective:   Patient was seen and examined 08/14/2020, 11:54 AM Patient stable today. No acute distress.  No issues overnight Stable for discharge.  Discharge Exam:    Vitals:   08/14/20 0541 08/14/20 0924 08/14/20 1016 08/14/20 1048  BP: (!) 162/74 (!) 157/86 [name])  130/55 137/69  Pulse: 74  77 71  Resp: 18 17 10 18   Temp: 98.6 F (37 C) 98.2 F (36.8 C) 97.9 F (36.6 C) 98.1 F (36.7 C)  TempSrc:  Oral  Oral  SpO2: 95% 94% 98% 97%  Weight:      Height:        General: Pt lying comfortably in bed & appears in no obvious distress. Cardiovascular: S1 & S2 heard, RRR, S1/S2 +. No murmurs, rubs, gallops or clicks. No JVD or pedal edema. Respiratory: Clear to auscultation without wheezing, rhonchi or crackles. No increased work of breathing. Abdominal:  Non-distended, non-tender & soft. No organomegaly or masses appreciated. Normal bowel sounds heard. CNS: Alert and oriented. No focal  deficits. Extremities: no edema, no cyanosis      The results of significant diagnostics from this hospitalization (including imaging, microbiology, ancillary and laboratory) are listed below for reference.      Microbiology:   Recent Results (from the past 240 hour(s))  Resp Panel by RT-PCR (Flu A&B, Covid) Nasopharyngeal Swab     Status: None   Collection Time: 08/11/20  3:07 PM   Specimen: Nasopharyngeal Swab; Nasopharyngeal(NP) swabs in vial transport medium  Result Value Ref Range Status   SARS Coronavirus 2 by RT PCR NEGATIVE NEGATIVE Final    Comment: (NOTE) SARS-CoV-2 target nucleic acids are NOT DETECTED.  The SARS-CoV-2 RNA is generally detectable in upper respiratory specimens during the acute phase of infection. The lowest concentration of SARS-CoV-2 viral copies this assay can detect is 138 copies/mL. A negative result does not preclude SARS-Cov-2 infection and should not be used as the sole basis for treatment or other patient management decisions. A negative result may occur with  improper specimen collection/handling, submission of specimen other than nasopharyngeal swab, presence of viral mutation(s) within the areas targeted by this assay, and inadequate number of viral copies(<138 copies/mL). A negative result must be combined with clinical observations, patient history, and epidemiological information. The expected result is Negative.  Fact Sheet for Patients:  EntrepreneurPulse.com.au  Fact Sheet for Healthcare Providers:  IncredibleEmployment.be  This test is no t yet approved or cleared by the [place] FDA and  has been authorized for detection and/or diagnosis of SARS-CoV-2 by FDA under an Emergency Use Authorization (EUA). This EUA will remain  in effect (meaning this test can be used) for the duration of the COVID-19 declaration under Section 564(b)(1) of the Act, 21 U.S.C.section 360bbb-3(b)(1), unless the  authorization is terminated  or revoked sooner.       Influenza A by PCR NEGATIVE NEGATIVE Final   Influenza B by PCR NEGATIVE NEGATIVE Final    Comment: (NOTE) The Xpert Xpress SARS-CoV-2/FLU/RSV plus assay is intended as an aid in the diagnosis of influenza from Nasopharyngeal swab specimens and should not be used as a sole basis for treatment. Nasal washings and aspirates are unacceptable for Xpert Xpress SARS-CoV-2/FLU/RSV testing.  Fact Sheet for Patients: EntrepreneurPulse.com.au  Fact Sheet for Healthcare Providers: IncredibleEmployment.be  This test is not yet approved or cleared by the [place] FDA and has been authorized for detection and/or diagnosis of SARS-CoV-2 by FDA under an Emergency Use Authorization (EUA). This EUA will remain in effect (meaning this test can be used) for the duration of the COVID-19 declaration under Section 564(b)(1) of the Act, 21 U.S.C. section 360bbb-3(b)(1), unless the authorization is terminated or revoked.  Performed at [facility], [address], [place],  [address]   Culture, blood (Routine X  2) w Reflex to ID Panel     Status: None (Preliminary result)   Collection Time: 08/12/20  8:11 AM   Specimen: BLOOD RIGHT ARM  Result Value Ref Range Status   Specimen Description   Final    BLOOD RIGHT ARM BOTTLES DRAWN AEROBIC AND ANAEROBIC   Special Requests Blood Culture adequate volume  Final   Culture   Final    NO GROWTH 1 DAY Performed at [facility], [address] [place], [place] [address]    Report Status PENDING  Incomplete  Culture, blood (Routine X 2) w Reflex to ID Panel     Status: None (Preliminary result)   Collection Time: 08/12/20  8:11 AM   Specimen: BLOOD RIGHT HAND  Result Value Ref Range Status   Specimen Description   Final    BLOOD RIGHT HAND BOTTLES DRAWN AEROBIC AND ANAEROBIC   Special Requests Blood Culture adequate volume  Final   Culture   Final    NO  GROWTH 1 DAY Performed at [facility], [address] [place], [place] [address]    Report Status PENDING  Incomplete  Urine Culture     Status: Abnormal (Preliminary result)   Collection Time: 08/12/20  8:34 AM   Specimen: Urine, Clean Catch  Result Value Ref Range Status   Specimen Description   Final    URINE, CLEAN CATCH Performed at [facility], [address], [place], [place] [address]    Special Requests   Final    Normal Performed at [facility], [address] [place], [place] [address]    Culture (A)  Final    >=100,000 COLONIES/mL GRAM NEGATIVE RODS SUSCEPTIBILITIES TO FOLLOW Performed at [facility], [place] [address] [place], [place] [address]    Report Status PENDING  Incomplete     Labs:   CBC: Recent Labs  Lab 08/11/20 0838 08/11/20 1658 08/12/20 0458 08/13/20 0450 08/14/20 0448  WBC 6.1  --  13.2* 6.7 5.4  NEUTROABS 5.1  --   --   --   --   HGB 8.4* 8.4* 7.3* 8.8* 8.6*  HCT 27.2* 27.2* 24.8* 28.5* 27.4*  MCV 93.2  --  95.4 93.4 92.9  PLT 250  --  236 192 664   Basic Metabolic Panel: Recent Labs  Lab 08/11/20 0838 08/12/20 0458 08/13/20 0450 08/14/20 0448  NA 143 142 141 142  K 4.3 3.7 3.5 3.5  CL 107 107 105 106  CO2 31 27 27 27   GLUCOSE 123* 102* 78 76  BUN 17 17 17 15   CREATININE 0.78 0.81 0.72 0.63  CALCIUM 8.9 8.3* 8.1* 8.1*  MG  --  1.9 1.9 1.7   Liver Function Tests: Recent Labs  Lab 08/11/20 0838 08/12/20 0458 08/13/20 0450 08/14/20 0448  AST 12* 11* 12* 11*  ALT 7 7 8 9   ALKPHOS 57 50 48 46  BILITOT 1.1 1.2 1.2 1.1  PROT 6.0* 5.3* 5.2* 4.8*  ALBUMIN 3.4* 3.1* 2.9* 2.7*   BNP (last 3 results) No results for input(s): BNP in the last 8760 hours. Cardiac Enzymes: No results for input(s): CKTOTAL, CKMB, CKMBINDEX, TROPONINI in the last 168 hours. CBG: No results for input(s): GLUCAP in the last 168 hours. Hgb A1c No results for input(s): HGBA1C in the last 72 hours. Lipid Profile No results for input(s):  CHOL, HDL, LDLCALC, TRIG, CHOLHDL, LDLDIRECT in the last 72 hours. Thyroid function studies No results for input(s): TSH, T4TOTAL, T3FREE, THYROIDAB in the  last 72 hours.  Invalid input(s): FREET3 Anemia work up No results for input(s): VITAMINB12, FOLATE, FERRITIN, TIBC, IRON, RETICCTPCT in the last 72 hours. Urinalysis    Component Value Date/Time   COLORURINE AMBER (A) 08/12/2020 0834   APPEARANCEUR CLOUDY (A) 08/12/2020 0834   LABSPEC 1.024 08/12/2020 0834   PHURINE 6.0 08/12/2020 0834   GLUCOSEU NEGATIVE 08/12/2020 0834   HGBUR LARGE (A) 08/12/2020 0834   BILIRUBINUR NEGATIVE 08/12/2020 0834   KETONESUR 20 (A) 08/12/2020 0834   PROTEINUR 100 (A) 08/12/2020 0834   UROBILINOGEN 1.0 05/07/2014 1838   NITRITE POSITIVE (A) 08/12/2020 0834   LEUKOCYTESUR MODERATE (A) 08/12/2020 0834         Time coordinating discharge: Over 45 minutes  SIGNED: Deatra James, MD, FACP, FHM. [facility],  Please use [email or web address] to Page If 7PM-7AM, please contact night-coverage [email or web address] Ota [facility] 08/14/2020, 11:54 AM

## 2020-08-14 NOTE — Progress Notes (Signed)
Brief colonoscopy note.  Examination performed to cecum. Preparation satisfactory. Redundant sigmoid colon.  Scope guide used to complete examination along with abdominal pressure which is applied to lower abdomen and right upper quadrant. Scattered diverticuli at ascending colon and hepatic flexure. External/internal hemorrhoids.

## 2020-08-14 NOTE — Progress Notes (Signed)
Subjective:  Patient has no complaints.  He says he finished the prep.  Last BM was around 330.  He has been passing watery stool.  He denies nausea vomiting chest pain shortness of breath or abdominal pain.  He is hungry.  He is hoping to go home today.   Current Medications:  Current Facility-Administered Medications:  .  0.9 %  sodium chloride infusion (Manually program via Guardrails IV Fluids), , Intravenous, Once, Reubin Milan, MD, Last Rate: 10 mL/hr at 08/12/20 0811, Rate Change at 08/12/20 0811 .  acetaminophen (TYLENOL) tablet 650 mg, 650 mg, Oral, Q6H PRN, 650 mg at 08/12/20 2047 **OR** acetaminophen (TYLENOL) suppository 650 mg, 650 mg, Rectal, Q6H PRN, Johnson, Clanford L, MD .  amLODipine (NORVASC) tablet 5 mg, 5 mg, Oral, Daily, Johnson, Clanford L, MD, 5 mg at 08/14/20 0800 .  cefTRIAXone (ROCEPHIN) 1 g in sodium chloride 0.9 % 100 mL IVPB, 1 g, Intravenous, Q24H, Emokpae, Courage, MD, Last Rate: 200 mL/hr at 08/13/20 1603, 1 g at 08/13/20 1603 .  dofetilide (TIKOSYN) capsule 500 mcg, 500 mcg, Oral, BID, Johnson, Clanford L, MD, 500 mcg at 08/14/20 0759 .  escitalopram (LEXAPRO) tablet 10 mg, 10 mg, Oral, QHS, Johnson, Clanford L, MD, 10 mg at 08/13/20 2212 .  furosemide (LASIX) tablet 20 mg, 20 mg, Oral, Daily, Johnson, Clanford L, MD, 20 mg at 08/14/20 0800 .  gabapentin (NEURONTIN) capsule 600 mg, 600 mg, Oral, BID, Johnson, Clanford L, MD, 600 mg at 08/14/20 0758 .  hydrALAZINE (APRESOLINE) injection 5 mg, 5 mg, Intravenous, Q8H PRN, Johnson, Clanford L, MD .  lisinopril (ZESTRIL) tablet 5 mg, 5 mg, Oral, Daily, Johnson, Clanford L, MD, 5 mg at 08/14/20 0801 .  ondansetron (ZOFRAN) tablet 4 mg, 4 mg, Oral, Q6H PRN **OR** ondansetron (ZOFRAN) injection 4 mg, 4 mg, Intravenous, Q6H PRN, Wynetta Emery, Clanford L, MD, 4 mg at 08/13/20 2028 .  pantoprazole (PROTONIX) injection 40 mg, 40 mg, Intravenous, Q12H, Baran Kuhrt U, MD, 40 mg at 08/14/20 0758 .  potassium chloride SA  (KLOR-CON) CR tablet 40 mEq, 40 mEq, Oral, Daily, Shahmehdi, Seyed A, MD, 40 mEq at 08/14/20 0759 .  pravastatin (PRAVACHOL) tablet 40 mg, 40 mg, Oral, q1800, Johnson, Clanford L, MD, 40 mg at 08/13/20 1727 .  tamsulosin (FLOMAX) capsule 0.4 mg, 0.4 mg, Oral, QPC supper, Johnson, Clanford L, MD, 0.4 mg at 08/13/20 1726 .  traZODone (DESYREL) tablet 25 mg, 25 mg, Oral, QHS PRN, Johnson, Clanford L, MD .  vitamin B-12 (CYANOCOBALAMIN) tablet 1,000 mcg, 1,000 mcg, Oral, Daily, Johnson, Clanford L, MD, 1,000 mcg at 08/14/20 0801   Objective: Blood pressure (!) 162/74, pulse 74, temperature 98.6 F (37 C), resp. rate 18, height 5' 8"  (1.727 m), weight 68 kg, SpO2 95 %. Patient is alert and in no acute distress. Cardiac exam with regular rhythm normal S1 and S2.  He has faint systolic murmur at aortic area. Lungs are clear to auscultation. Abdomen is symmetrical.  Bowel sounds are normal.  No abdominal bruit noted.  On palpation abdomen is soft.  Biliary cannulation was easily palpable in midepigastric region above umbilicus.  No organomegaly. He has pitting edema involving both legs.  Edema has not changed. He has bilateral foot drop.   Labs/studies Results:   CBC Latest Ref Rng & Units 08/14/2020 08/13/2020 08/12/2020  WBC 4.0 - 10.5 K/uL 5.4 6.7 13.2(H)  Hemoglobin 13.0 - 17.0 g/dL 8.6(L) 8.8(L) 7.3(L)  Hematocrit 39.0 - 52.0 % 27.4(L) 28.5(L) 24.8(L)  Platelets 150 - 400 K/uL 200 192 236    CMP Latest Ref Rng & Units 08/14/2020 08/13/2020 08/12/2020  Glucose 70 - 99 mg/dL 76 78 102(H)  BUN 8 - 23 mg/dL 15 17 17   Creatinine 0.61 - 1.24 mg/dL 0.63 0.72 0.81  Sodium 135 - 145 mmol/L 142 141 142  Potassium 3.5 - 5.1 mmol/L 3.5 3.5 3.7  Chloride 98 - 111 mmol/L 106 105 107  CO2 22 - 32 mmol/L 27 27 27   Calcium 8.9 - 10.3 mg/dL 8.1(L) 8.1(L) 8.3(L)  Total Protein 6.5 - 8.1 g/dL 4.8(L) 5.2(L) 5.3(L)  Total Bilirubin 0.3 - 1.2 mg/dL 1.1 1.2 1.2  Alkaline Phos 38 - 126 U/L 46 48 50  AST 15 - 41  U/L 11(L) 12(L) 11(L)  ALT 0 - 44 U/L 9 8 7     Hepatic Function Latest Ref Rng & Units 08/14/2020 08/13/2020 08/12/2020  Total Protein 6.5 - 8.1 g/dL 4.8(L) 5.2(L) 5.3(L)  Albumin 3.5 - 5.0 g/dL 2.7(L) 2.9(L) 3.1(L)  AST 15 - 41 U/L 11(L) 12(L) 11(L)  ALT 0 - 44 U/L 9 8 7   Alk Phosphatase 38 - 126 U/L 46 48 50  Total Bilirubin 0.3 - 1.2 mg/dL 1.1 1.2 1.2     Urine culture is incomplete  Blood cultures negative   Assessment:  #1.  GI bleed.  Patient presented with melena.    EGD yesterday did not reveal source of bleeding.  Patient has received 1 unit of PRBCs.  Hemoglobin is 8.6 g today.  It was 8.8 g yesterday.  Patient will undergo diagnostic colonoscopy this morning.  If colonoscopy is negative would consider small bowel given capsule study on an outpatient basis while he is back on anticoagulant. Patient's condition was discussed with his wife Enid Derry and her daughter yesterday.  #2.  Anemia secondary to GI bleed.  Discussed with Dr. Roxan Hockey.  Patient received 1 unit of PRBCs when temperature is down.  #3.  Fever.  Fever secondary to urinary tract infection.  Patient is on ceftriaxone.  #4.  History of coronary artery disease and paroxysmal atrial fibrillation.  Anticoagulation is on hold because of GI bleed.    Recommendations  Proceed with diagnostic colonoscopy this morning. [name]

## 2020-08-14 NOTE — Anesthesia Preprocedure Evaluation (Signed)
Anesthesia Evaluation  Patient identified by MRN, date of birth, ID band Patient awake    Reviewed: Allergy & Precautions, NPO status , Patient's Chart, lab work & pertinent test results  Airway Mallampati: II  TM Distance: >3 FB Neck ROM: Full   Comment: H/o tracheostomy  Dental  (+) Dental Advisory Given, Missing   Pulmonary  Tracheostomy after GBS  H/o tracheostomy Pulmonary exam normal breath sounds clear to auscultation       Cardiovascular Exercise Tolerance: Poor METS (Paraplegic): hypertension, Pt. on medications + Past MI and +CHF  Normal cardiovascular exam+ dysrhythmias Atrial Fibrillation  Rhythm:Regular Rate:Normal  11-Aug-2020 08:10:34 [facility] ROUTINE RECORD [id number] (34 yr) Male Caucasian Room:ER2 Loc:499 Technician: VSP Test ind: Comment 1:: Comment 2:: Comment 3:: Comment 4:: Vent. rate 84 BPM PR interval 173 ms QRS duration 99 ms QT/QTcB 387/458 ms P-R-T axes 74 18 52 Sinus rhythm No significant change since last tracing Confirmed by Lacretia Leigh ([id number]) on 08/12/2020 10:50:07 AM   Neuro/Psych TIA Neuromuscular disease (Gullian Barre Syndrome, incomplete paraplegia) negative psych ROS   GI/Hepatic negative GI ROS, Neg liver ROS,   Endo/Other  negative endocrine ROS  Renal/GU Renal disease     Musculoskeletal negative musculoskeletal ROS (+)   Abdominal   Peds  Hematology  (+) anemia ,   Anesthesia Other Findings   Reproductive/Obstetrics negative OB ROS                             Anesthesia Physical  Anesthesia Plan  ASA: IV  Anesthesia Plan: General   Post-op Pain Management:    Induction: Intravenous  PONV Risk Score and Plan: Propofol infusion  Airway Management Planned: Nasal Cannula and Natural Airway  Additional Equipment:   Intra-op Plan:   Post-operative Plan:   Informed Consent: I have reviewed the  patients History and Physical, chart, labs and discussed the procedure including the risks, benefits and alternatives for the proposed anesthesia with the patient or authorized representative who has indicated his/her understanding and acceptance.     Dental advisory given  Plan Discussed with: Surgeon  Anesthesia Plan Comments:         Anesthesia Quick Evaluation

## 2020-08-14 NOTE — Transfer of Care (Signed)
Immediate Anesthesia Transfer of Care Note  Patient: Theodore Mendoza  Procedure(s) Performed: COLONOSCOPY WITH PROPOFOL (N/A )  Patient Location: PACU  Anesthesia Type:General  Level of Consciousness: awake, alert  and oriented  Airway & Oxygen Therapy: Patient Spontanous Breathing and Patient connected to nasal cannula oxygen  Post-op Assessment: Post -op Vital signs reviewed and stable  Post vital signs: Reviewed and stable  Last Vitals:  Vitals Value Taken Time  BP 130/55 08/14/20 1016  Temp 97.9   Pulse 77 08/14/20 1018  Resp 12 08/14/20 1018  SpO2 98 % 08/14/20 1018  Vitals shown include unvalidated device data.  Last Pain:  Vitals:   08/14/20 0926  TempSrc:   PainSc: 0-No pain      Patients Stated Pain Goal: 0 (52/77/82 4235)  Complications: No complications documented.

## 2020-08-14 NOTE — Op Note (Signed)
[facility] Patient Name: Theodore Mendoza Procedure Date: 08/14/2020 8:54 AM MRN: [id number] Date of Birth: 1941/02/19 Attending MD: Hildred Laser , MD CSN: [id number] Age: 80 Admit Type: Inpatient Procedure:                Colonoscopy Indications:              Gastrointestinal bleeding Providers:                Hildred Laser, MD, Lurline Del, RN, Casimer Bilis, Technician Referring MD:             Roxan Hockey, MD Medicines:                Propofol per Anesthesia Complications:            No immediate complications. Estimated Blood Loss:     Estimated blood loss: none. Procedure:                Pre-Anesthesia Assessment:                           - Prior to the procedure, a History and Physical                            was performed, and patient medications and                            allergies were reviewed. The patient's tolerance of                            previous anesthesia was also reviewed. The risks                            and benefits of the procedure and the sedation                            options and risks were discussed with the patient.                            All questions were answered, and informed consent                            was obtained. Prior Anticoagulants: The patient                            last took Eliquis (apixaban) 4 days and Plavix                            (clopidogrel) 3 days prior to the procedure. ASA                            Grade Assessment: III - A patient with severe  systemic disease. After reviewing the risks and                            benefits, the patient was deemed in satisfactory                            condition to undergo the procedure.                           After obtaining informed consent, the colonoscope                            was passed under direct vision. Throughout the                            procedure, the patient's blood  pressure, pulse, and                            oxygen saturations were monitored continuously. The                            [id number]) was introduced through the anus                            and advanced to the the cecum, identified by                            appendiceal orifice and ileocecal valve. The                            colonoscopy was technically difficult and complex                            due to a redundant colon. Successful completion of                            the procedure was aided by using manual pressure                            and scope guide. The patient tolerated the                            procedure well. The quality of the bowel                            preparation was adequate. The ileocecal valve,                            appendiceal orifice, and rectum were photographed. Scope In: 9:32:08 AM Scope Out: 10:03:46 AM Scope Withdrawal Time: 0 hours 4 minutes 48 seconds  Total Procedure Duration: 0 hours 31 minutes 38 seconds  Findings:      The perianal and digital rectal examinations were normal.      Scattered medium-mouthed diverticula were found in the hepatic  flexure       and ascending colon.      The exam was otherwise normal throughout the examined colon.      External and internal hemorrhoids were found during retroflexion. The       hemorrhoids were medium-sized. Impression:               - Diverticulosis at the hepatic flexure and in the                            ascending colon. No stigmata of bleed.                           - External and internal hemorrhoids.                           - No specimens collected.                           Comment;suspect colinic diverticular bleed.                           if bleeding recurs will do Small bowel Given                            capsule study. Moderate Sedation:      Per Anesthesia Care Recommendation:           - Return patient to hospital ward for ongoing care.                            - Cardiac diet today.                           - Continue present medications.                           - Resume anticoagulant and anticoagulant tomorrow. Procedure Code(s):        --- Professional ---                           [phone], Colonoscopy, flexible; diagnostic, including                            collection of specimen(s) by brushing or washing,                            when performed (separate procedure) Diagnosis Code(s):        --- Professional ---                           I62.7, Other hemorrhoids                           K92.2, Gastrointestinal hemorrhage, unspecified                           K57.30, Diverticulosis of large intestine without  perforation or abscess without bleeding CPT copyright 2019 [employer]. All rights reserved. The codes documented in this report are preliminary and upon coder review may  be revised to meet current compliance requirements. Hildred Laser, MD Hildred Laser, MD 08/14/2020 10:15:44 AM This report has been signed electronically. Number of Addenda: 0

## 2020-08-15 ENCOUNTER — Encounter (HOSPITAL_COMMUNITY): Payer: Self-pay | Admitting: Internal Medicine

## 2020-08-15 LAB — URINE CULTURE
Culture: >=100000 — AB
Special Requests: NORMAL

## 2020-08-16 ENCOUNTER — Ambulatory Visit: Payer: Medicare Other | Admitting: Pharmacist

## 2020-08-17 LAB — CULTURE, BLOOD (ROUTINE X 2)
Culture: NO GROWTH
Culture: NO GROWTH
Special Requests: ADEQUATE
Special Requests: ADEQUATE

## 2020-08-23 ENCOUNTER — Other Ambulatory Visit: Payer: Self-pay

## 2020-08-23 ENCOUNTER — Ambulatory Visit (INDEPENDENT_AMBULATORY_CARE_PROVIDER_SITE_OTHER): Payer: Medicare Other | Admitting: Family Medicine

## 2020-08-23 ENCOUNTER — Encounter: Payer: Self-pay | Admitting: Family Medicine

## 2020-08-23 VITALS — BP 155/65 | HR 61 | Temp 97.8°F

## 2020-08-23 DIAGNOSIS — Z8719 Personal history of other diseases of the digestive system: Secondary | ICD-10-CM

## 2020-08-23 DIAGNOSIS — F411 Generalized anxiety disorder: Secondary | ICD-10-CM | POA: Diagnosis not present

## 2020-08-23 DIAGNOSIS — L989 Disorder of the skin and subcutaneous tissue, unspecified: Secondary | ICD-10-CM

## 2020-08-23 DIAGNOSIS — I10 Essential (primary) hypertension: Secondary | ICD-10-CM | POA: Diagnosis not present

## 2020-08-23 NOTE — Progress Notes (Signed)
Assessment & Plan:  1. History of GI bleed Resolved. Miralax 1 capful in 6-8 oz beverage of choice once daily as needed for constipation.  Encouraged to keep his follow-up with gastroenterology next month. - CBC with Differential/Platelet - CMP14+EGFR  2. Scalp lesion Offered a referral to dermatology.  Patient would like to think about it and let me know at his next appointment.  3. Essential (primary) hypertension Patient to monitor his blood pressure at home.  Will reassess at his follow-up next week.  4. Generalized anxiety disorder Patient admits he does not feel well controlled at this time due to his recent hospitalization and recent diagnosis of breast cancer for his wife and daughter.  Offered an increase in Lexapro, but he would like to think about it.   Follow up plan: Return as scheduled.  [name], MSN, APRN, FNP-C [facility]  Subjective:   Patient ID: Theodore Mendoza, male    DOB: February 14, 1941, 80 y.o.   MRN: [id number]  HPI: Theodore Mendoza is a 80 y.o. male presenting on 08/23/2020 for Hospitalization Follow-up (AP 08/11/20- rectal bleeding )  Patient was admitted to [facility] 08/11/2020 - 08/14/2020 due to rectal bleeding.  Patient had an EGD and colonoscopy.  EGD showed an essentially normal study with exception of 2 polyps in the gastric body without stigmata or bleeding, clips noted at the prior polypectomy site at the gastric body without bleeding; negative for any other abnormalities.  Colonoscopy findings consistent with hemorrhoids; diverticulosis of the large intestine without perforation, abscess, or bleeding.  Patient was advised to follow-up with gastroenterology in 2 to 3 weeks.  This is scheduled for 09/22/2020.  Patient did require 1 unit of PRBCs due to symptomatic anemia.  Patient also had a UTI that was treated with Rocephin and ciprofloxacin.  Today patient denies any further episodes of bleeding.  He does report  occasional constipation.  Patient reports he has a spot on the right side of his head above his forehead that has been present x1 year.  He cannot get the area to heal.  Denies any itching or bleeding.  Patient's blood pressure is elevated today.  He does check it intermittently at home and states his top number is generally in the 130s.  Depression screen [facility] 2/9 08/23/2020 05/23/2020 05/06/2020  Decreased Interest 1 0 0  Down, Depressed, Hopeless 0 1 1  PHQ - 2 Score 1 1 1   Altered sleeping 3 - -  Tired, decreased energy 3 - -  Change in appetite 2 - -  Feeling bad or failure about yourself  0 - -  Trouble concentrating 0 - -  Moving slowly or fidgety/restless 0 - -  Suicidal thoughts 0 - -  PHQ-9 Score 9 - -  Difficult doing work/chores Not difficult at all - -   GAD 7 : Generalized Anxiety Score 08/23/2020 03/11/2020  Nervous, Anxious, on Edge 2 1  Control/stop worrying 2 0  Worry too much - different things 3 1  Trouble relaxing 1 0  Restless 1 0  Easily annoyed or irritable 2 0  Afraid - awful might happen 3 1  Total GAD 7 Score 14 3  Anxiety Difficulty Somewhat difficult Not difficult at all    ROS: Negative unless specifically indicated above in HPI.   Relevant past medical history reviewed and updated as indicated.   Allergies and medications reviewed and updated.   Current Outpatient Medications:  .  amLODipine (NORVASC) 5  MG tablet, TAKE 1 TABLET BY MOUTH DAILY. (Patient taking differently: Take 5 mg by mouth daily.), Disp: 90 tablet, Rfl: 0 .  apixaban (ELIQUIS) 5 MG TABS tablet, Take 1 tablet (5 mg total) by mouth 2 (two) times daily., Disp: 180 tablet, Rfl: 3 .  clopidogrel (PLAVIX) 75 MG tablet, TAKE 1 TABLET ONCE DAILY (Patient taking differently: Take 75 mg by mouth daily.), Disp: 90 tablet, Rfl: 1 .  dofetilide (TIKOSYN) 500 MCG capsule, Take 1 capsule (500 mcg total) by mouth 2 (two) times daily., Disp: 180 capsule, Rfl: 3 .  escitalopram (LEXAPRO) 10 MG  tablet, TAKE 1 TABLET AT BEDTIME (Patient taking differently: Take 10 mg by mouth at bedtime.), Disp: 90 tablet, Rfl: 0 .  Ferrous Sulfate (IRON) 325 (65 Fe) MG TABS, Take 1 tablet by mouth daily. , Disp: , Rfl:  .  furosemide (LASIX) 20 MG tablet, Take 1 tablet (20 mg total) by mouth daily., Disp: 90 tablet, Rfl: 1 .  gabapentin (NEURONTIN) 300 MG capsule, TAKE 1 CAPSULE TWICE DAILY AND 2 AT BEDTIME (Patient taking differently: Take 300 mg by mouth See admin instructions. 2 capsules in the day and 2 at bedtime), Disp: 360 capsule, Rfl: 0 .  lisinopril (ZESTRIL) 5 MG tablet, TAKE 1 TABLET ONCE DAILY (Patient taking differently: Take 5 mg by mouth daily.), Disp: 90 tablet, Rfl: 1 .  pantoprazole (PROTONIX) 40 MG tablet, TAKE  (1)  TABLET TWICE A DAY. (Patient taking differently: Take 40 mg by mouth 2 (two) times daily.), Disp: 60 tablet, Rfl: 2 .  pravastatin (PRAVACHOL) 40 MG tablet, TAKE 1 TABLET ONCE DAILY WITH LUNCH (Patient taking differently: Take 40 mg by mouth daily.), Disp: 90 tablet, Rfl: 0 .  sucralfate (CARAFATE) 1 g tablet, TAKE  (1)  TABLET  FOUR TIMES DAILY. (Patient taking differently: Take 1 g by mouth 2 (two) times daily.), Disp: 120 tablet, Rfl: 2 .  tamsulosin (FLOMAX) 0.4 MG CAPS capsule, TAKE 1 CAPSULE ONCE DAILY (Patient taking differently: Take 0.4 mg by mouth daily.), Disp: 30 capsule, Rfl: 2 .  vitamin B-12 (CYANOCOBALAMIN) 1000 MCG tablet, Take 1 tablet (1,000 mcg total) by mouth daily., Disp: 30 tablet, Rfl: 2  Allergies  Allergen Reactions  . Hydrocodone-Acetaminophen   . Morphine And Related Other (See Comments)    Reaction:  Confusion/weakness/hallucinations  . Valsartan Rash    Objective:   BP (!) 155/65   Pulse 61   Temp 97.8 F (36.6 C) (Temporal)   SpO2 99%    Physical Exam Vitals reviewed.  Constitutional:      General: He is not in acute distress.    Appearance: Normal appearance. He is overweight. He is not ill-appearing, toxic-appearing or  diaphoretic.  HENT:     Head: Normocephalic and atraumatic.  Eyes:     General: No scleral icterus.       Right eye: No discharge.        Left eye: No discharge.     Conjunctiva/sclera: Conjunctivae normal.  Cardiovascular:     Rate and Rhythm: Regular rhythm. Bradycardia present.     Heart sounds: Normal heart sounds. No murmur heard. No friction rub. No gallop.   Pulmonary:     Effort: Pulmonary effort is normal. No respiratory distress.     Breath sounds: Normal breath sounds. No stridor. No wheezing, rhonchi or rales.  Musculoskeletal:        General: Normal range of motion.     Cervical back: Normal range of motion.  Comments: Paralysis from the waist down.  Skin:    General: Skin is warm and dry.     Findings: Lesion (right side of scalp above forehead with irregular shaped and colored lesion) present.  Neurological:     Mental Status: He is alert and oriented to person, place, and time. Mental status is at baseline.     Gait: Gait abnormal (rides in [facility]).  Psychiatric:        Mood and Affect: Mood normal.        Behavior: Behavior normal.        Thought Content: Thought content normal.        Judgment: Judgment normal.

## 2020-08-24 LAB — CBC WITH DIFFERENTIAL/PLATELET
Basophils Absolute: 0.1 10*3/uL (ref 0.0–0.2)
Basos: 1 %
EOS (ABSOLUTE): 0.2 10*3/uL (ref 0.0–0.4)
Eos: 4 %
Hematocrit: 32.8 % — ABNORMAL LOW (ref 37.5–51.0)
Hemoglobin: 10.4 g/dL — ABNORMAL LOW (ref 13.0–17.7)
Immature Grans (Abs): 0 10*3/uL (ref 0.0–0.1)
Immature Granulocytes: 1 %
Lymphocytes Absolute: 1.3 10*3/uL (ref 0.7–3.1)
Lymphs: 24 %
MCH: 27.5 pg (ref 26.6–33.0)
MCHC: 31.7 g/dL (ref 31.5–35.7)
MCV: 87 fL (ref 79–97)
Monocytes Absolute: 0.4 10*3/uL (ref 0.1–0.9)
Monocytes: 8 %
Neutrophils Absolute: 3.2 10*3/uL (ref 1.4–7.0)
Neutrophils: 62 %
Platelets: 223 10*3/uL (ref 150–450)
RBC: 3.78 x10E6/uL — ABNORMAL LOW (ref 4.14–5.80)
RDW: 13 % (ref 11.6–15.4)
WBC: 5.1 10*3/uL (ref 3.4–10.8)

## 2020-08-24 LAB — CMP14+EGFR
ALT: 9 IU/L (ref 0–44)
AST: 16 IU/L (ref 0–40)
Albumin/Globulin Ratio: 2.2 (ref 1.2–2.2)
Albumin: 3.7 g/dL (ref 3.6–4.6)
Alkaline Phosphatase: 76 IU/L (ref 44–121)
BUN/Creatinine Ratio: 16 (ref 10–24)
BUN: 12 mg/dL (ref 8–27)
Bilirubin Total: 0.5 mg/dL (ref 0.0–1.2)
CO2: 30 mmol/L — ABNORMAL HIGH (ref 20–29)
Calcium: 8.8 mg/dL (ref 8.6–10.2)
Chloride: 102 mmol/L (ref 96–106)
Creatinine, Ser: 0.74 mg/dL — ABNORMAL LOW (ref 0.76–1.27)
Globulin, Total: 1.7 g/dL (ref 1.5–4.5)
Glucose: 108 mg/dL — ABNORMAL HIGH (ref 65–99)
Potassium: 4.2 mmol/L (ref 3.5–5.2)
Sodium: 141 mmol/L (ref 134–144)
Total Protein: 5.4 g/dL — ABNORMAL LOW (ref 6.0–8.5)
eGFR: 91 mL/min/{1.73_m2} (ref >59)

## 2020-09-01 ENCOUNTER — Ambulatory Visit (HOSPITAL_COMMUNITY)
Admission: RE | Admit: 2020-09-01 | Discharge: 2020-09-01 | Disposition: A | Discharge status: Home / Self Care | Payer: Medicare Other | Source: Ambulatory Visit | Attending: Family Medicine | Admitting: Family Medicine

## 2020-09-01 ENCOUNTER — Other Ambulatory Visit: Payer: Self-pay

## 2020-09-01 ENCOUNTER — Ambulatory Visit (INDEPENDENT_AMBULATORY_CARE_PROVIDER_SITE_OTHER): Payer: Medicare Other | Admitting: Family Medicine

## 2020-09-01 ENCOUNTER — Encounter: Payer: Self-pay | Admitting: Family Medicine

## 2020-09-01 VITALS — BP 96/48 | HR 68 | Temp 98.0°F

## 2020-09-01 DIAGNOSIS — I714 Abdominal aortic aneurysm, without rupture, unspecified: Secondary | ICD-10-CM

## 2020-09-01 DIAGNOSIS — D649 Anemia, unspecified: Secondary | ICD-10-CM

## 2020-09-01 DIAGNOSIS — R351 Nocturia: Secondary | ICD-10-CM

## 2020-09-01 DIAGNOSIS — G610 Guillain-Barre syndrome: Secondary | ICD-10-CM

## 2020-09-01 DIAGNOSIS — Z8673 Personal history of transient ischemic attack (TIA), and cerebral infarction without residual deficits: Secondary | ICD-10-CM

## 2020-09-01 DIAGNOSIS — D329 Benign neoplasm of meninges, unspecified: Secondary | ICD-10-CM

## 2020-09-01 DIAGNOSIS — G609 Hereditary and idiopathic neuropathy, unspecified: Secondary | ICD-10-CM

## 2020-09-01 DIAGNOSIS — Z7901 Long term (current) use of anticoagulants: Secondary | ICD-10-CM | POA: Diagnosis not present

## 2020-09-01 DIAGNOSIS — I10 Essential (primary) hypertension: Secondary | ICD-10-CM | POA: Diagnosis not present

## 2020-09-01 DIAGNOSIS — M7989 Other specified soft tissue disorders: Secondary | ICD-10-CM | POA: Diagnosis not present

## 2020-09-01 DIAGNOSIS — I480 Paroxysmal atrial fibrillation: Secondary | ICD-10-CM

## 2020-09-01 DIAGNOSIS — E785 Hyperlipidemia, unspecified: Secondary | ICD-10-CM

## 2020-09-01 DIAGNOSIS — F332 Major depressive disorder, recurrent severe without psychotic features: Secondary | ICD-10-CM

## 2020-09-01 DIAGNOSIS — G8222 Paraplegia, incomplete: Secondary | ICD-10-CM

## 2020-09-01 DIAGNOSIS — S8012XA Contusion of left lower leg, initial encounter: Secondary | ICD-10-CM | POA: Diagnosis not present

## 2020-09-01 DIAGNOSIS — F419 Anxiety disorder, unspecified: Secondary | ICD-10-CM

## 2020-09-01 DIAGNOSIS — I252 Old myocardial infarction: Secondary | ICD-10-CM

## 2020-09-01 LAB — HEMOGLOBIN, FINGERSTICK: Hemoglobin: 8.2 g/dL — ABNORMAL LOW (ref 12.6–17.7)

## 2020-09-01 MED ORDER — PRAVASTATIN SODIUM 40 MG PO TABS: 40.0000 mg | ORAL_TABLET | Freq: Every day | ORAL | 1 refills | Status: DC

## 2020-09-01 MED ORDER — TAMSULOSIN HCL 0.4 MG PO CAPS: 0.4000 mg | ORAL_CAPSULE | Freq: Every day | ORAL | 1 refills | Status: DC

## 2020-09-01 MED ORDER — PANTOPRAZOLE SODIUM 40 MG PO TBEC: 40.0000 mg | DELAYED_RELEASE_TABLET | Freq: Two times a day (BID) | ORAL | 1 refills | Status: DC

## 2020-09-01 MED ORDER — GABAPENTIN 300 MG PO CAPS: ORAL_CAPSULE | ORAL | 1 refills | Status: DC

## 2020-09-01 MED ORDER — SUCRALFATE 1 G PO TABS: 1.0000 g | ORAL_TABLET | Freq: Two times a day (BID) | ORAL | 1 refills | Status: DC

## 2020-09-01 MED ORDER — ESCITALOPRAM OXALATE 20 MG PO TABS: 20.0000 mg | ORAL_TABLET | Freq: Every day | ORAL | 5 refills | Status: DC

## 2020-09-01 NOTE — Patient Instructions (Signed)
Go to [facility] for your ultrasound now.  Come tomorrow for a repeat hemoglobin.  The GI office is going to put you in a cancellation spot for Monday, June 6th. They will call you with the time.

## 2020-09-01 NOTE — Progress Notes (Signed)
Assessment & Plan:  1. Paroxysmal atrial fibrillation ([place]) Managed by cardiology.  2. Long term current use of anticoagulant Managed by cardiology.  3. Abdominal aortic aneurysm (AAA) without rupture ([place]) Managed by Vascular.  - pravastatin (PRAVACHOL) 40 MG tablet; Take 1 tablet (40 mg total) by mouth daily.  Dispense: 90 tablet; Refill: 1  4. Essential (primary) hypertension Advised to hold Amlodipine. Low BP readings felt to be related to decreasing hemoglobin. Continue to monitor at home.  5. History of non-ST elevation myocardial infarction (NSTEMI) On statin. - pravastatin (PRAVACHOL) 40 MG tablet; Take 1 tablet (40 mg total) by mouth daily.  Dispense: 90 tablet; Refill: 1  6. Hyperlipidemia, unspecified hyperlipidemia type Well controlled on current regimen.  - pravastatin (PRAVACHOL) 40 MG tablet; Take 1 tablet (40 mg total) by mouth daily.  Dispense: 90 tablet; Refill: 1  7. History of TIA (transient ischemic attack) On statin & Plavix. - pravastatin (PRAVACHOL) 40 MG tablet; Take 1 tablet (40 mg total) by mouth daily.  Dispense: 90 tablet; Refill: 1  8. Idiopathic peripheral neuropathy Well controlled on current regimen.  - gabapentin (NEURONTIN) 300 MG capsule; Take 1 capsule (300 mg total) by mouth 2 (two) times daily AND 2 capsules (600 mg total) at bedtime.  Dispense: 360 capsule; Refill: 1  9. Guillain Barr syndrome ([place]) Well controlled on current regimen.  - gabapentin (NEURONTIN) 300 MG capsule; Take 1 capsule (300 mg total) by mouth 2 (two) times daily AND 2 capsules (600 mg total) at bedtime.  Dispense: 360 capsule; Refill: 1  10. Paraplegia, incomplete [facility]) Patient does well caring for himself at home.  11. Symptomatic anemia Hgb 8.2 today which is down from 10.4 9 days ago. Call placed to GI - explained he has an appointment on 6/23 but needs to be seen ASAP due to decreasing hgb after recent hospitalization for GI bleeding. They are going to put  him in one of the cancellation spots for Monday. They will call him with an appointment time. Advised patient to return here tomorrow for repeat POC hemoglobin. Also advised if BP drops further or he has worsening of dizziness, weakness and/or fatigue, that he needs to go to the ER. His last hospital admission worsened his depression, so we are trying very hard to keep him out of the hospital.  - Anemia Profile B - Hemoglobin, fingerstick  12. Meningioma [facility]) Managed by neurosurgery.  13. Severe episode of recurrent major depressive disorder, without psychotic features ([place]) Uncontrolled. Lexapro increased from 10 mg to 20 mg once daily.  - escitalopram (LEXAPRO) 20 MG tablet; Take 1 tablet (20 mg total) by mouth daily.  Dispense: 30 tablet; Refill: 5  14. Anxiety Uncontrolled. Lexapro increased from 10 mg to 20 mg once daily.  - escitalopram (LEXAPRO) 20 MG tablet; Take 1 tablet (20 mg total) by mouth daily.  Dispense: 30 tablet; Refill: 5  15. Nocturia Advised to take furosemide in the morning and not the evening.   16. Swelling of left lower extremity STAT u/s to rule out DVT. If negative - patient to apply thigh high compression hose daily. Encouraged to elevate lower extremities as much as possible.  - US Venous Img Lower Unilateral Left; Future   Return in about 2 months (around 11/01/2020) for depression.  [name], MSN, APRN, FNP-C [facility]  Subjective:    Patient ID: Theodore Mendoza, male    DOB: April 05, 1940, 80 y.o.   MRN: [id number]  Patient Care Team:  Loman Brooklyn, FNP as PCP - General (Family Medicine) Loretta Plume as Consulting Physician (Neurosurgery) Alexis Frock, MD as Consulting Physician (Urology) Dudley Major, MD as Consulting Physician (Cardiology) Lavera Guise, [facility] (Pharmacist)   Chief Complaint:  Chief Complaint  Patient presents with  . Hypertension  . Hyperlipidemia    4 month follow up of chronic medical  conditions    . Fatigue    Since he had a fall in May    HPI: Theodore Mendoza is a 80 y.o. male presenting on 09/01/2020 for Hypertension, Hyperlipidemia (4 month follow up of chronic medical conditions /), and Fatigue (Since he had a fall in May)  A-Fib: managed by cardiology.  Taking Tikosyn and Eliquis. Does not qualify for prescription assistance.   AAA: Patient has seen vascular surgery who does not recommend surgery until he is at least 5 cm due to his multiple comorbidities.    Anemia/Recent GI bleed: hgb last week of 10.4. Patient denies any obvious bleeding. His blood pressures have been running low this week: 113/43, 101/38, 99/69, 96/46, 86/38, 102/53, 93/44, 92/52. These readings are after he has taken his medications. He has been more fatigued and dizzy than previously.   Anxiety/Depression: patient does not feel he is doing well. Just wants to sleep all the time and has no desire to do anything. His wife and his daughter are currently battling cancer. He was recently hospitalized. He has several family members that have passed away over the past year. He has been taking Lexapro 10 mg once daily.   Depression screen [facility] 2/9 09/01/2020 08/23/2020 05/23/2020  Decreased Interest 3 1 0  Down, Depressed, Hopeless 2 0 1  PHQ - 2 Score _0 Altered sleeping 3 3 -  Tired, decreased energy 3 3 -  Change in appetite 2 2 -  Feeling bad or failure about yourself  2 0 -  Trouble concentrating 2 0 -  Moving slowly or fidgety/restless 2 0 -  Suicidal thoughts 2 0 -  PHQ-9 Score 21 9 -  Difficult doing work/chores Extremely dIfficult Not difficult at all -   Thoughts are that he would be better off dead, not of harming himself.   GAD 7 : Generalized Anxiety Score 09/01/2020 08/23/2020 03/11/2020  Nervous, Anxious, on Edge _1 Control/stop worrying 2 2 0  Worry too much - different things _2 Trouble relaxing 2 1 0  Restless 2 1 0  Easily annoyed or irritable 3 2 0  Afraid - awful  might happen _3 Total GAD 7 Score _4 Anxiety Difficulty Somewhat difficult Somewhat difficult Not difficult at all   Meningioma: Followed by neurosurgery.  He was last seen in June 2021 and they advised a follow-up in 3 years with a repeat brain MRI.  New complaints: Patient reports he is up all night urinating every 2 hours. He has been taking his furosemide in the evening.  Patient has left lower extremity swelling that has been present x1 month since he fell. Denies any pain. Reports he was severely bruised, but that it has greatly improved.    Social history:  Relevant past medical, surgical, family and social history reviewed and updated as indicated. Interim medical history since our last visit reviewed.  Allergies and medications reviewed and updated.  DATA REVIEWED: CHART IN EPIC  ROS: Negative unless specifically indicated above in HPI.    Current Outpatient Medications:  .  amLODipine (NORVASC) 5 MG tablet, TAKE 1 TABLET BY MOUTH DAILY. (Patient taking differently: Take 5 mg by mouth daily.), Disp: 90 tablet, Rfl: 0 .  apixaban (ELIQUIS) 5 MG TABS tablet, Take 1 tablet (5 mg total) by mouth 2 (two) times daily., Disp: 180 tablet, Rfl: 3 .  clopidogrel (PLAVIX) 75 MG tablet, TAKE 1 TABLET ONCE DAILY (Patient taking differently: Take 75 mg by mouth daily.), Disp: 90 tablet, Rfl: 1 .  dofetilide (TIKOSYN) 500 MCG capsule, Take 1 capsule (500 mcg total) by mouth 2 (two) times daily., Disp: 180 capsule, Rfl: 3 .  escitalopram (LEXAPRO) 10 MG tablet, TAKE 1 TABLET AT BEDTIME (Patient taking differently: Take 10 mg by mouth at bedtime.), Disp: 90 tablet, Rfl: 0 .  Ferrous Sulfate (IRON) 325 (65 Fe) MG TABS, Take 1 tablet by mouth daily. , Disp: , Rfl:  .  furosemide (LASIX) 20 MG tablet, Take 1 tablet (20 mg total) by mouth daily., Disp: 90 tablet, Rfl: 1 .  gabapentin (NEURONTIN) 300 MG capsule, TAKE 1 CAPSULE TWICE DAILY AND 2 AT BEDTIME (Patient taking differently: Take  300 mg by mouth See admin instructions. 2 capsules in the day and 2 at bedtime), Disp: 360 capsule, Rfl: 0 .  lisinopril (ZESTRIL) 5 MG tablet, TAKE 1 TABLET ONCE DAILY (Patient taking differently: Take 5 mg by mouth daily.), Disp: 90 tablet, Rfl: 1 .  pantoprazole (PROTONIX) 40 MG tablet, TAKE  (1)  TABLET TWICE A DAY. (Patient taking differently: Take 40 mg by mouth 2 (two) times daily.), Disp: 60 tablet, Rfl: 2 .  pravastatin (PRAVACHOL) 40 MG tablet, TAKE 1 TABLET ONCE DAILY WITH LUNCH (Patient taking differently: Take 40 mg by mouth daily.), Disp: 90 tablet, Rfl: 0 .  sucralfate (CARAFATE) 1 g tablet, TAKE  (1)  TABLET  FOUR TIMES DAILY. (Patient taking differently: Take 1 g by mouth 2 (two) times daily.), Disp: 120 tablet, Rfl: 2 .  tamsulosin (FLOMAX) 0.4 MG CAPS capsule, TAKE 1 CAPSULE ONCE DAILY (Patient taking differently: Take 0.4 mg by mouth daily.), Disp: 30 capsule, Rfl: 2 .  vitamin B-12 (CYANOCOBALAMIN) 1000 MCG tablet, Take 1 tablet (1,000 mcg total) by mouth daily., Disp: 30 tablet, Rfl: 2   Allergies  Allergen Reactions  . Hydrocodone-Acetaminophen   . Morphine And Related Other (See Comments)    Reaction:  Confusion/weakness/hallucinations  . Valsartan Rash   Past Medical History:  Diagnosis Date  . AAA (abdominal aortic aneurysm) ([place])   . Brain tumor ([place])    x2  . Cardiac arrhythmia   . CHF (congestive heart failure) ([place])   . Guillain-Barre syndrome ([place]) Feb 13 1986  . Hypertension   . Kidney stones   . Myocardial infarction [facility]) 2007    Past Surgical History:  Procedure Laterality Date  . COLONOSCOPY WITH PROPOFOL N/A 08/14/2020   Procedure: COLONOSCOPY WITH PROPOFOL;  Surgeon: Rogene Houston, MD;  Location[facility];  Service: Endoscopy;  Laterality: N/A;  . CORONARY ANGIOPLASTY  1997   after mi  . CYSTOSCOPY WITH RETROGRADE PYELOGRAM, URETEROSCOPY AND STENT PLACEMENT Left 06/28/2014   Procedure: 1ST STAGE CYSTOSCOPY/URETEROSCOPY/STENT PLACEMENT;   Surgeon: Alexis Frock, MD;  Location: [facility];  Service: Urology;  Laterality: Left;  . CYSTOSCOPY WITH STENT PLACEMENT Left 05/08/2014   Procedure: CYSTOSCOPY, RETROGRADE PYELOGRAM WITH LEFT URETERAL STENT PLACEMENT;  Surgeon: Alexis Frock, MD;  Location: [facility];  Service: Urology;  Laterality: Left;  . ESOPHAGOGASTRODUODENOSCOPY (EGD) WITH PROPOFOL N/A 12/16/2019   Procedure: ESOPHAGOGASTRODUODENOSCOPY (EGD)  WITH PROPOFOL;  Surgeon: Mauri Pole, MD;  [facility];  Service: Endoscopy;  Laterality: N/A;  . ESOPHAGOGASTRODUODENOSCOPY (EGD) WITH PROPOFOL N/A 08/13/2020   Procedure: ESOPHAGOGASTRODUODENOSCOPY (EGD) WITH PROPOFOL;  Surgeon: Rogene Houston, MD;  [facility];  Service: Endoscopy;  Laterality: N/A;  . EYE SURGERY Right may 2015   growth removed, july 2015left eye cataract removed, right eye catarct removed  . gamma kniferadiation treatment  Feb 09 2014   [facility]  . HEMOSTASIS CLIP PLACEMENT  12/16/2019   Procedure: HEMOSTASIS CLIP PLACEMENT;  Surgeon: Mauri Pole, MD;  [facility];  Service: Endoscopy;;  . HEMOSTASIS CONTROL  12/16/2019   Procedure: HEMOSTASIS CONTROL;  Surgeon: Mauri Pole, MD;  [facility];  Service: Endoscopy;;  Endoloop  . HOLMIUM LASER APPLICATION Left 2/42/3536   Procedure: HOLMIUM LASER APPLICATION;  Surgeon: Alexis Frock, MD;  Location: [facility];  Service: Urology;  Laterality: Left;  . LITHOTRIPSY  years ago  . POLYPECTOMY  12/16/2019   Procedure: POLYPECTOMY;  Surgeon: Mauri Pole, MD;  [facility];  Service: Endoscopy;;  . STONE EXTRACTION WITH BASKET Left 06/28/2014   Procedure: STONE EXTRACTION WITH BASKET;  Surgeon: Alexis Frock, MD;  Location: [facility];  Service: Urology;  Laterality: Left;    Social History   Socioeconomic History  . Marital status: Married    Spouse name: shirley   . Number of children: 1  . Years of education: Not on file  .  Highest education level: Not on file  Occupational History  . Occupation: retired   Tobacco Use  . Smoking status: Never Smoker  . Smokeless tobacco: Never Used  Vaping Use  . Vaping Use: Never used  Substance and Sexual Activity  . Alcohol use: No  . Drug use: No  . Sexual activity: Not Currently  Other Topics Concern  . Not on file  Social History Narrative   Lives with wife    Social Determinants of Health   Financial Resource Strain: Not on file  Food Insecurity: Not on file  Transportation Needs: Not on file  Physical Activity: Not on file  Stress: Not on file  Social Connections: Not on file  Intimate Partner Violence: Not on file        Objective:    BP (!) 96/48   Pulse 68   Temp 98 F (36.7 C) (Temporal)   SpO2 100%   Wt Readings from Last 3 Encounters:  08/11/20 150 lb (68 kg)  05/23/20 173 lb (78.5 kg)  05/06/20 173 lb (78.5 kg)    Physical Exam Vitals reviewed.  Constitutional:      General: He is not in acute distress.    Appearance: Normal appearance. He is overweight. He is not ill-appearing, toxic-appearing or diaphoretic.  HENT:     Head: Normocephalic and atraumatic.  Eyes:     General: No scleral icterus.       Right eye: No discharge.        Left eye: No discharge.     Conjunctiva/sclera: Conjunctivae normal.  Cardiovascular:     Rate and Rhythm: Regular rhythm.     Heart sounds: Normal heart sounds. No murmur heard. No friction rub. No gallop.   Pulmonary:     Effort: Pulmonary effort is normal. No respiratory distress.     Breath sounds: Normal breath sounds. No stridor. No wheezing, rhonchi or rales.  Musculoskeletal:        General: Normal  range of motion.     Cervical back: Normal range of motion.     Left lower leg: 2+ Pitting Edema present.     Comments: Paralysis from the waist down.  Skin:    General: Skin is warm and dry.     Comments: Yellow discoloration to LLE.  Neurological:     Mental Status: He is alert and  oriented to person, place, and time. Mental status is at baseline.     Gait: Gait abnormal (rides in [facility]).  Psychiatric:        Mood and Affect: Mood normal.        Behavior: Behavior normal.        Thought Content: Thought content normal.        Judgment: Judgment normal.     Lab Results  Component Value Date   TSH 3.590 05/06/2020   Lab Results  Component Value Date   WBC 5.1 08/23/2020   HGB 10.4 (L) 08/23/2020   HCT 32.8 (L) 08/23/2020   MCV 87 08/23/2020   PLT 223 08/23/2020   Lab Results  Component Value Date   NA 141 08/23/2020   K 4.2 08/23/2020   CO2 30 (H) 08/23/2020   GLUCOSE 108 (H) 08/23/2020   BUN 12 08/23/2020   CREATININE 0.74 (L) 08/23/2020   BILITOT 0.5 08/23/2020   ALKPHOS 76 08/23/2020   AST 16 08/23/2020   ALT 9 08/23/2020   PROT 5.4 (L) 08/23/2020   ALBUMIN 3.7 08/23/2020   CALCIUM 8.8 08/23/2020   ANIONGAP 9 08/14/2020   EGFR 91 08/23/2020   Lab Results  Component Value Date   CHOL 111 05/06/2020   Lab Results  Component Value Date   HDL 43 05/06/2020   Lab Results  Component Value Date   LDLCALC 57 05/06/2020   Lab Results  Component Value Date   TRIG 43 05/06/2020   Lab Results  Component Value Date   CHOLHDL 2.6 05/06/2020   Lab Results  Component Value Date   HGBA1C 4.7 05/06/2020

## 2020-09-02 ENCOUNTER — Encounter (HOSPITAL_COMMUNITY): Payer: Self-pay | Admitting: *Deleted

## 2020-09-02 ENCOUNTER — Emergency Department (HOSPITAL_COMMUNITY)
Admission: EM | Admit: 2020-09-02 | Discharge: 2020-09-02 | Disposition: A | Discharge status: Home / Self Care | Payer: Medicare Other | Attending: Emergency Medicine | Admitting: Emergency Medicine

## 2020-09-02 ENCOUNTER — Other Ambulatory Visit: Payer: Self-pay

## 2020-09-02 ENCOUNTER — Other Ambulatory Visit: Payer: Medicare Other

## 2020-09-02 ENCOUNTER — Telehealth: Payer: Self-pay | Admitting: Family Medicine

## 2020-09-02 DIAGNOSIS — Z79899 Other long term (current) drug therapy: Secondary | ICD-10-CM | POA: Insufficient documentation

## 2020-09-02 DIAGNOSIS — I110 Hypertensive heart disease with heart failure: Secondary | ICD-10-CM | POA: Diagnosis not present

## 2020-09-02 DIAGNOSIS — K625 Hemorrhage of anus and rectum: Secondary | ICD-10-CM

## 2020-09-02 DIAGNOSIS — D649 Anemia, unspecified: Secondary | ICD-10-CM

## 2020-09-02 DIAGNOSIS — I509 Heart failure, unspecified: Secondary | ICD-10-CM | POA: Diagnosis not present

## 2020-09-02 DIAGNOSIS — Z7901 Long term (current) use of anticoagulants: Secondary | ICD-10-CM | POA: Diagnosis not present

## 2020-09-02 DIAGNOSIS — Z7902 Long term (current) use of antithrombotics/antiplatelets: Secondary | ICD-10-CM | POA: Diagnosis not present

## 2020-09-02 LAB — ANEMIA PROFILE B
Basophils Absolute: 0.1 10*3/uL (ref 0.0–0.2)
Basos: 1 %
EOS (ABSOLUTE): 0.1 10*3/uL (ref 0.0–0.4)
Eos: 2 %
Ferritin: 79 ng/mL (ref 30–400)
Folate: 10.2 ng/mL (ref >3.0)
Hematocrit: 26.1 % — ABNORMAL LOW (ref 37.5–51.0)
Hemoglobin: 8.3 g/dL — CL (ref 13.0–17.7)
Immature Grans (Abs): 0 10*3/uL (ref 0.0–0.1)
Immature Granulocytes: 0 %
Iron Saturation: 14 % — ABNORMAL LOW (ref 15–55)
Iron: 36 ug/dL — ABNORMAL LOW (ref 38–169)
Lymphocytes Absolute: 0.8 10*3/uL (ref 0.7–3.1)
Lymphs: 19 %
MCH: 27.7 pg (ref 26.6–33.0)
MCHC: 31.8 g/dL (ref 31.5–35.7)
MCV: 87 fL (ref 79–97)
Monocytes Absolute: 0.3 10*3/uL (ref 0.1–0.9)
Monocytes: 6 %
Neutrophils Absolute: 3 10*3/uL (ref 1.4–7.0)
Neutrophils: 72 %
Platelets: 202 10*3/uL (ref 150–450)
RBC: 3 x10E6/uL — ABNORMAL LOW (ref 4.14–5.80)
RDW: 13.1 % (ref 11.6–15.4)
Retic Ct Pct: 2.4 % (ref 0.6–2.6)
Total Iron Binding Capacity: 256 ug/dL (ref 250–450)
UIBC: 220 ug/dL (ref 111–343)
Vitamin B-12: 532 pg/mL (ref 232–1245)
WBC: 4.3 10*3/uL (ref 3.4–10.8)

## 2020-09-02 LAB — COMPREHENSIVE METABOLIC PANEL
ALT: 7 U/L (ref 0–44)
AST: 11 U/L — ABNORMAL LOW (ref 15–41)
Albumin: 3.7 g/dL (ref 3.5–5.0)
Alkaline Phosphatase: 59 U/L (ref 38–126)
Anion gap: 6 (ref 5–15)
BUN: 21 mg/dL (ref 8–23)
CO2: 31 mmol/L (ref 22–32)
Calcium: 8.8 mg/dL — ABNORMAL LOW (ref 8.9–10.3)
Chloride: 103 mmol/L (ref 98–111)
Creatinine, Ser: 0.71 mg/dL (ref 0.61–1.24)
GFR, Estimated: >60 mL/min (ref >60)
Glucose, Bld: 104 mg/dL — ABNORMAL HIGH (ref 70–99)
Potassium: 4.1 mmol/L (ref 3.5–5.1)
Sodium: 140 mmol/L (ref 135–145)
Total Bilirubin: 1.7 mg/dL — ABNORMAL HIGH (ref 0.3–1.2)
Total Protein: 6.2 g/dL — ABNORMAL LOW (ref 6.5–8.1)

## 2020-09-02 LAB — CBC WITH DIFFERENTIAL/PLATELET
Abs Immature Granulocytes: 0.02 10*3/uL (ref 0.00–0.07)
Basophils Absolute: 0 10*3/uL (ref 0.0–0.1)
Basophils Relative: 1 %
Eosinophils Absolute: 0.1 10*3/uL (ref 0.0–0.5)
Eosinophils Relative: 1 %
HCT: 29.7 % — ABNORMAL LOW (ref 39.0–52.0)
Hemoglobin: 9.1 g/dL — ABNORMAL LOW (ref 13.0–17.0)
Immature Granulocytes: 0 %
Lymphocytes Relative: 14 %
Lymphs Abs: 0.7 10*3/uL (ref 0.7–4.0)
MCH: 28.1 pg (ref 26.0–34.0)
MCHC: 30.6 g/dL (ref 30.0–36.0)
MCV: 91.7 fL (ref 80.0–100.0)
Monocytes Absolute: 0.4 10*3/uL (ref 0.1–1.0)
Monocytes Relative: 7 %
Neutro Abs: 3.9 10*3/uL (ref 1.7–7.7)
Neutrophils Relative %: 77 %
Platelets: 191 10*3/uL (ref 150–400)
RBC: 3.24 MIL/uL — ABNORMAL LOW (ref 4.22–5.81)
RDW: 13.7 % (ref 11.5–15.5)
WBC: 5.1 10*3/uL (ref 4.0–10.5)
nRBC: 0 % (ref 0.0–0.2)

## 2020-09-02 LAB — TYPE AND SCREEN
ABO/RH(D): O NEG
Antibody Screen: NEGATIVE

## 2020-09-02 LAB — POC OCCULT BLOOD, ED: Fecal Occult Bld: POSITIVE — AB

## 2020-09-02 LAB — PROTIME-INR
INR: 1.5 — ABNORMAL HIGH (ref 0.8–1.2)
Prothrombin Time: 17.8 seconds — ABNORMAL HIGH (ref 11.4–15.2)

## 2020-09-02 LAB — HEMOGLOBIN, FINGERSTICK: Hemoglobin: 7.4 g/dL — ABNORMAL LOW (ref 12.6–17.7)

## 2020-09-02 NOTE — ED Provider Notes (Signed)
Emergency Medicine Provider Triage Evaluation Note  KHOLE BRANCH , a 80 y.o. male  was evaluated in triage.  Pt complains of low hgb.[name] PCP sent him in today. + weakness, lightheadedness and fatigue. ECD/ Colonoscopy last month. Patient is on Eliquis and Plavix for afib/ heart disease  Review of Systems  Positive: weakness Negative: Melena, hematochezia  Physical Exam  BP (!) 156/74 (BP Location: Left Arm)   Pulse 67   Temp (!) 97.5 F (36.4 C) (Oral)   Resp 18   SpO2 99%  Gen:   Awake, no distress   Resp:  Normal effort  MSK:   Moves extremities without difficulty  Other:    Medical Decision Making  Medically screening exam initiated at 4:13 PM.  Appropriate orders placed.  Jonael L Coard was informed that the remainder of the evaluation will be completed by another provider, this initial triage assessment does not replace that evaluation, and the importance of remaining in the ED until their evaluation is complete.  Low hgb. Labs initiated.   Margarita Mail, PA-C 09/02/20 1616    Daleen Bo, MD 09/03/20 1049

## 2020-09-02 NOTE — ED Provider Notes (Signed)
  Face-to-face evaluation   History: Patient here because his hemoglobin was low when checked today.  He has ongoing GI bleeding.  He is actively seeing GI and has an appointment in 3 days.  He denies shortness of breath, chest pain, focal weakness or paresthesia.  He is not vomiting.  He is here with his daughter who helps to give the history.  Physical exam: Alert elderly male who is calm and comfortable.  Findings discussed with the family members.  No respiratory distress.  He is lucid.   Medical screening examination/treatment/procedure(s) were conducted as a shared visit with non-physician practitioner(s) and myself.  I personally evaluated the patient during the encounter    Daleen Bo, MD 09/03/20 1049

## 2020-09-02 NOTE — ED Triage Notes (Signed)
Hemoglobin 7.4. referred by PCP

## 2020-09-02 NOTE — Discharge Instructions (Signed)
Your hemoglobin by our test today os 9.1 so you do not need a blood transfusion at this time.  Keep your appointment with your GI specialist on Monday as planned.  In the interim, return here if you develop worse bleeding, weakness, shortness of breath, chest pain or any new symptoms.

## 2020-09-02 NOTE — ED Provider Notes (Signed)
[facility] EMERGENCY DEPARTMENT Provider Note   CSN: [id number] Arrival date & time: 09/02/20  1441     History No chief complaint on file.   Theodore Mendoza is a 80 y.o. male.  With a history as outlined below including known GI blood loss having been admitted to the hospital early May with a similar presentation including anemia requiring transfusion.  He has been seen by his primary doctor over the past several days and had fingerstick hemoglobin tests including hemoglobin of 8.1 yesterday, and then again today it was 7.4.  He was sent here for anticipated blood transfusion.  Patient denies any abdominal pain, also denies dizziness or significant weakness beyond his baseline, he is wheelchair-bound secondary to Guillain-Barr syndrome.  He also denies shortness of breath, chest pain, headaches.  He does see a small amount of bright red blood with bowel movements and was told this is probably from hemorrhoid.  He is scheduled to see Dr. Jenetta Downer in 3 days for further evaluation of this blood loss.   HPI     Past Medical History:  Diagnosis Date  . AAA (abdominal aortic aneurysm) ([place])   . Brain tumor ([place])    x2  . Cardiac arrhythmia   . CHF (congestive heart failure) ([place])   . Guillain-Barre syndrome ([place]) Feb 13 1986  . Hypertension   . Kidney stones   . Myocardial infarction [facility]) 2007    Patient Active Problem List   Diagnosis Date Noted  . GBS (Guillain Barre syndrome) ([place]) 08/12/2020  . Rectal bleeding 08/11/2020  . Acute blood loss anemia 08/11/2020  . Fall 08/08/2020  . Long term current use of anticoagulant 01/20/2020  . Generalized edema 12/28/2019  . Abdominal aortic aneurysm (AAA) without rupture ([place])   . Gastric polyp   . Symptomatic anemia 12/14/2019  . Atrial fibrillation ([place]) 04/21/2019  . Paraplegia, incomplete ([place]) 10/16/2018  . Idiopathic peripheral neuropathy 06/26/2017  . Late effects of CVA (cerebrovascular accident) 11/16/2015  . History of  TIA (transient ischemic attack) 10/01/2015  . Hyperlipidemia 05/08/2014  . Essential (primary) hypertension 05/07/2014  . Meningioma ([place]) 12/13/2011  . History of non-ST elevation myocardial infarction (NSTEMI) 01/17/2011    Past Surgical History:  Procedure Laterality Date  . COLONOSCOPY WITH PROPOFOL N/A 08/14/2020   Procedure: COLONOSCOPY WITH PROPOFOL;  Surgeon: Rogene Houston, MD;  Location: [facility];  Service: Endoscopy;  Laterality: N/A;  . CORONARY ANGIOPLASTY  1997   after mi  . CYSTOSCOPY WITH RETROGRADE PYELOGRAM, URETEROSCOPY AND STENT PLACEMENT Left 06/28/2014   Procedure: 1ST STAGE CYSTOSCOPY/URETEROSCOPY/STENT PLACEMENT;  Surgeon: Alexis Frock, MD;  Location: [facility] ORS;  Service: Urology;  Laterality: Left;  . CYSTOSCOPY WITH STENT PLACEMENT Left 05/08/2014   Procedure: CYSTOSCOPY, RETROGRADE PYELOGRAM WITH LEFT URETERAL STENT PLACEMENT;  Surgeon: Alexis Frock, MD;  Location: [facility];  Service: Urology;  Laterality: Left;  . ESOPHAGOGASTRODUODENOSCOPY (EGD) WITH PROPOFOL N/A 12/16/2019   Procedure: ESOPHAGOGASTRODUODENOSCOPY (EGD) WITH PROPOFOL;  Surgeon: Mauri Pole, MD;  Location: [facility];  Service: Endoscopy;  Laterality: N/A;  . ESOPHAGOGASTRODUODENOSCOPY (EGD) WITH PROPOFOL N/A 08/13/2020   Procedure: ESOPHAGOGASTRODUODENOSCOPY (EGD) WITH PROPOFOL;  Surgeon: Rogene Houston, MD;  Location: [facility];  Service: Endoscopy;  Laterality: N/A;  . EYE SURGERY Right may 2015   growth removed, july 2015left eye cataract removed, right eye catarct removed  . gamma kniferadiation treatment  Feb 09 2014   [facility]  . HEMOSTASIS CLIP PLACEMENT  12/16/2019  Procedure: HEMOSTASIS CLIP PLACEMENT;  Surgeon: Mauri Pole, MD;  Location: [facility];  Service: Endoscopy;;  . HEMOSTASIS CONTROL  12/16/2019   Procedure: HEMOSTASIS CONTROL;  Surgeon: Mauri Pole, MD;  Location: [facility] END[facility];  Service: Endoscopy;;  Endoloop  . HOLMIUM LASER  APPLICATION Left 1/88/4166   Procedure: HOLMIUM LASER APPLICATION;  Surgeon: Alexis Frock, MD;  Location: [facility];  Service: Urology;  Laterality: Left;  . LITHOTRIPSY  years ago  . POLYPECTOMY  12/16/2019   Procedure: POLYPECTOMY;  Surgeon: Mauri Pole, MD;  Location: [facility];  Service: Endoscopy;;  . STONE EXTRACTION WITH BASKET Left 06/28/2014   Procedure: STONE EXTRACTION WITH BASKET;  Surgeon: Alexis Frock, MD;  Location: [facility];  Service: Urology;  Laterality: Left;       Family History  Problem Relation Age of Onset  . Diabetes Mother   . Lung cancer Mother   . CAD Father   . Diabetes Brother   . Diabetes Sister   . Stroke Sister   . Other Maternal Grandfather        brain tumor  . Colon cancer Neg Hx   . Pancreatic cancer Neg Hx   . Esophageal cancer Neg Hx     Social History   Tobacco Use  . Smoking status: Never Smoker  . Smokeless tobacco: Never Used  Vaping Use  . Vaping Use: Never used  Substance Use Topics  . Alcohol use: No  . Drug use: No    Home Medications Prior to Admission medications   Medication Sig Start Date End Date Taking? Authorizing Provider  amLODipine (NORVASC) 5 MG tablet TAKE 1 TABLET BY MOUTH DAILY. Patient taking differently: Take 5 mg by mouth daily. 03/21/20   Dettinger, Fransisca Kaufmann, MD  apixaban (ELIQUIS) 5 MG TABS tablet Take 1 tablet (5 mg total) by mouth 2 (two) times daily. 06/06/20   Loman Brooklyn, FNP  clopidogrel (PLAVIX) 75 MG tablet TAKE 1 TABLET ONCE DAILY Patient taking differently: Take 75 mg by mouth daily. 08/03/20   Loman Brooklyn, FNP  dofetilide (TIKOSYN) 500 MCG capsule Take 1 capsule (500 mcg total) by mouth 2 (two) times daily. 06/06/20   Loman Brooklyn, FNP  escitalopram (LEXAPRO) 20 MG tablet Take 1 tablet (20 mg total) by mouth daily. 09/01/20   Loman Brooklyn, FNP  Ferrous Sulfate (IRON) 325 (65 Fe) MG TABS Take 1 tablet by mouth daily.     [provider]  furosemide (LASIX) 20 MG  tablet Take 1 tablet (20 mg total) by mouth daily. 05/06/20   Loman Brooklyn, FNP  gabapentin (NEURONTIN) 300 MG capsule Take 1 capsule (300 mg total) by mouth 2 (two) times daily AND 2 capsules (600 mg total) at bedtime. 09/01/20   Loman Brooklyn, FNP  lisinopril (ZESTRIL) 5 MG tablet TAKE 1 TABLET ONCE DAILY Patient taking differently: Take 5 mg by mouth daily. 05/20/20   Loman Brooklyn, FNP  pantoprazole (PROTONIX) 40 MG tablet Take 1 tablet (40 mg total) by mouth 2 (two) times daily. 09/01/20   Loman Brooklyn, FNP  pravastatin (PRAVACHOL) 40 MG tablet Take 1 tablet (40 mg total) by mouth daily. 09/01/20   Loman Brooklyn, FNP  sucralfate (CARAFATE) 1 g tablet Take 1 tablet (1 g total) by mouth 2 (two) times daily. 09/01/20   Loman Brooklyn, FNP  tamsulosin (FLOMAX) 0.4 MG CAPS capsule Take 1 capsule (0.4 mg total) by mouth daily. 09/01/20   [name]  F, FNP  vitamin B-12 (CYANOCOBALAMIN) 1000 MCG tablet Take 1 tablet (1,000 mcg total) by mouth daily. 12/18/19   Patrecia Pour, MD    Allergies    Hydrocodone-acetaminophen, Morphine and related, and Valsartan  Review of Systems   Review of Systems  Constitutional: Negative for fever.  HENT: Negative for congestion and sore throat.   Eyes: Negative.   Respiratory: Negative for chest tightness and shortness of breath.   Cardiovascular: Negative for chest pain.  Gastrointestinal: Positive for blood in stool. Negative for abdominal pain, nausea and vomiting.  Genitourinary: Negative.   Musculoskeletal: Negative for arthralgias, joint swelling and neck pain.  Skin: Negative.  Negative for rash and wound.  Neurological: Positive for weakness. Negative for dizziness, light-headedness, numbness and headaches.       Generalized chronic weakness secondary to Guillain-Barr, not worsened.  Psychiatric/Behavioral: Negative.   All other systems reviewed and are negative.   Physical Exam Updated Vital Signs BP (!) 154/71   Pulse 78   Temp (!)  97.5 F (36.4 C) (Oral)   Resp 18   SpO2 100%   Physical Exam Vitals and nursing note reviewed. Exam conducted with a chaperone present.  Constitutional:      Appearance: Normal appearance. He is well-developed.  HENT:     Head: Normocephalic and atraumatic.     Mouth/Throat:     Mouth: Mucous membranes are moist.  Eyes:     Conjunctiva/sclera: Conjunctivae normal.  Cardiovascular:     Rate and Rhythm: Normal rate and regular rhythm.     Heart sounds: Normal heart sounds.  Pulmonary:     Effort: Pulmonary effort is normal.     Breath sounds: Normal breath sounds. No wheezing.  Abdominal:     General: Bowel sounds are normal.     Palpations: Abdomen is soft.     Tenderness: There is no abdominal tenderness. There is no guarding or rebound.  Genitourinary:    Rectum: Guaiac result positive.     Comments: Brown stool, moderate hemoccult positive. Musculoskeletal:        General: Normal range of motion.     Cervical back: Normal range of motion.  Skin:    General: Skin is warm and dry.  Neurological:     Mental Status: He is alert.     ED Results / Procedures / Treatments   Labs (all labs ordered are listed, but only abnormal results are displayed) Labs Reviewed  COMPREHENSIVE METABOLIC PANEL - Abnormal; Notable for the following components:      Result Value   Glucose, Bld 104 (*)    Calcium 8.8 (*)    Total Protein 6.2 (*)    AST 11 (*)    Total Bilirubin 1.7 (*)    All other components within normal limits  CBC WITH DIFFERENTIAL/PLATELET - Abnormal; Notable for the following components:   RBC 3.24 (*)    Hemoglobin 9.1 (*)    HCT 29.7 (*)    All other components within normal limits  PROTIME-INR - Abnormal; Notable for the following components:   Prothrombin Time 17.8 (*)    INR 1.5 (*)    All other components within normal limits  POC OCCULT BLOOD, ED - Abnormal; Notable for the following components:   Fecal Occult Bld POSITIVE (*)    All other components  within normal limits  POC OCCULT BLOOD, ED  TYPE AND SCREEN    EKG None  Radiology US Venous Img Lower Unilateral Left  Result Date:  09/01/2020 CLINICAL DATA:  Fall, bruising, pain EXAM: LEFT LOWER EXTREMITY VENOUS DOPPLER ULTRASOUND TECHNIQUE: Gray-scale sonography with graded compression, as well as color Doppler and duplex ultrasound were performed to evaluate the lower extremity deep venous systems from the level of the common femoral vein and including the common femoral, femoral, profunda femoral, popliteal and calf veins including the posterior tibial, peroneal and gastrocnemius veins when visible. The superficial great saphenous vein was also interrogated. Spectral Doppler was utilized to evaluate flow at rest and with distal augmentation maneuvers in the common femoral, femoral and popliteal veins. COMPARISON:  None. FINDINGS: Contralateral Common Femoral Vein: Respiratory phasicity is normal and symmetric with the symptomatic side. No evidence of thrombus. Normal compressibility. Common Femoral Vein: No evidence of thrombus. Normal compressibility, respiratory phasicity and response to augmentation. Saphenofemoral Junction: No evidence of thrombus. Normal compressibility and flow on color Doppler imaging. Profunda Femoral Vein: No evidence of thrombus. Normal compressibility and flow on color Doppler imaging. Femoral Vein: No evidence of thrombus. Normal compressibility, respiratory phasicity and response to augmentation. Popliteal Vein: No evidence of thrombus. Normal compressibility, respiratory phasicity and response to augmentation. Calf Veins: No evidence of thrombus. Normal compressibility and flow on color Doppler imaging. Other Findings:  Lower extremity subcutaneous edema noted. IMPRESSION: No evidence of significant acute deep venous thrombosis. Limited exam because of extensive lower extremity edema. Electronically Signed   By: Jerilynn Mages.  Shick M.D.   On: 09/01/2020 15:15     Procedures Procedures   Medications Ordered in ED Medications - No data to display  ED Course  I have reviewed the triage vital signs and the nursing notes.  Pertinent labs & imaging results that were available during my care of the patient were reviewed by me and considered in my medical decision making (see chart for details).    MDM Rules/Calculators/A&P                          Labs reviewed and discussed with patient and daughter at bedside.  He does not have hemoglobin that would require transfusion at this time at 9.1, especially given no other symptoms suggesting symptomatic anemia.  He is scheduled to see Dr. Jenetta Downer in 3 days for further evaluation of the GI blood loss.  He is stable at this time.  However he was given strict return precautions for any worsening bleeding or weakness or any new complaints.  He and daughter understand and are agreeable with this plan. Final Clinical Impression(s) / ED Diagnoses Final diagnoses:  Anemia, unspecified type  Rectal bleeding    Rx / DC Orders ED Discharge Orders    None       Landis Martins 09/02/20 2040    Daleen Bo, MD 09/03/20 1049

## 2020-09-02 NOTE — Telephone Encounter (Signed)
Hemoglobin 7.4 today.  Blood pressure this morning 95/58. Advised patient to go to the ER due to drop from 10.4 last week to 8.2 yesterday to 7.4 today with symptoms of weakness, dizziness, and hypotension. ER called and report given to nurse.

## 2020-09-05 ENCOUNTER — Encounter (INDEPENDENT_AMBULATORY_CARE_PROVIDER_SITE_OTHER): Payer: Self-pay

## 2020-09-05 ENCOUNTER — Other Ambulatory Visit (INDEPENDENT_AMBULATORY_CARE_PROVIDER_SITE_OTHER): Payer: Self-pay

## 2020-09-05 ENCOUNTER — Other Ambulatory Visit: Payer: Self-pay

## 2020-09-05 ENCOUNTER — Ambulatory Visit (INDEPENDENT_AMBULATORY_CARE_PROVIDER_SITE_OTHER): Payer: Medicare Other | Admitting: Gastroenterology

## 2020-09-05 ENCOUNTER — Encounter (INDEPENDENT_AMBULATORY_CARE_PROVIDER_SITE_OTHER): Payer: Self-pay | Admitting: Gastroenterology

## 2020-09-05 VITALS — BP 138/65 | HR 64 | Temp 98.0°F | Ht 68.0 in

## 2020-09-05 DIAGNOSIS — Z01812 Encounter for preprocedural laboratory examination: Secondary | ICD-10-CM

## 2020-09-05 DIAGNOSIS — K922 Gastrointestinal hemorrhage, unspecified: Secondary | ICD-10-CM

## 2020-09-05 DIAGNOSIS — K625 Hemorrhage of anus and rectum: Secondary | ICD-10-CM | POA: Diagnosis not present

## 2020-09-05 DIAGNOSIS — D649 Anemia, unspecified: Secondary | ICD-10-CM

## 2020-09-05 NOTE — Patient Instructions (Signed)
Schedule push enteroscopy Continue oral iron Ask your cardiologist if Eliquis can be decreased to 2.5 mg dosage

## 2020-09-05 NOTE — Progress Notes (Signed)
Maylon Peppers, M.D. Gastroenterology & Hepatology [facility] [address] [place], [place] [address]  Primary Care Physician: Loman Brooklyn, [place] [address]  I will communicate my assessment and recommendations to the referring MD via EMR.  Problems: 1. History of gastrointestinal bleeding 2. Symptomatic anemia  History of Present Illness: Theodore Mendoza is a 80 y.o. male with PMH brain tumor, afib on Eliquis, Guillian Barr and heart failure, who presents for follow up after recent hospitalization for gastrointestinal bleeding.  Patient reports that 8 months ago he had episodes of melena and positive FOBT.  He was presenting some anemia of 10.7 at that time for which she underwent an EGD on 12/16/2019 at [facility].  The patient was found to have a 20 mm polyp ulcerated mucosa in the in the cardia.  A large Endoloop was placed in the polyp stalk.  The polyp was subsequently removed with a hot snare.  2 hemostatic clips were placed successfully but there was persistent oozing from this area.  No further intervention was performed at that time but consultation with interventional radiology was made and the patient was not recommended to have any embolization of the vasculature.  Pathology of the polyp showed hyperplastic polyp with inflammatory changes.  The patient came to the hospital at [facility] on 08/11/2020. He reportsed having significant straining when moving his bowels and he reported having some episodes of rectal bleeding since 07/31/2020. He had rectal bleeding only two times at that time. He states he has been taking a stool softener since then which has helped decreasing his constipation and straining.  Due to drop in his hemoglobin he underwent an EGD and a colonoscopy.  The EGD showed presence of the previously placed clips with no abnormalities up to the third portion of the  duodenum.  Colonoscopy showed hemorrhoids and diverticulosis but no other alterations.  He is currently Eliquis 5 mg for afib and Plavix for stroke prevention.  Notably, the patient was recently sent to the ER on 09/02/2020 after he was found to have worsening anemia in most recent blood work-up performed on the same day.  His hemoglobin dropped down from 8.3-7.4 in 1 day and he was transfused 1 unit PRBC with repeat of 9.1.  Notably his iron stores on 09/01/2020 only showed mildly decreased iron of 36 and iron saturation of 14, ferritin was normal at 79. States that he has been taking iron for the last year and his stools are currently dark brown but not black.   The patient denies having any nausea, vomiting, fever, chills,  hematemesis, abdominal distention, abdominal pain, diarrhea, jaundice, pruritus or weight loss.  FHx: neg for any gastrointestinal/liver disease, no malignancies Social: neg smoking, alcohol or illicit drug use Surgical: no abdominal surgeries  Past Medical History: Past Medical History:  Diagnosis Date  . AAA (abdominal aortic aneurysm) ([place])   . Brain tumor ([place])    x2  . Cardiac arrhythmia   . CHF (congestive heart failure) ([place])   . Guillain-Barre syndrome ([place]) Feb 13 1986  . Hypertension   . Kidney stones   . Myocardial infarction [facility]) 2007    Past Surgical History: Past Surgical History:  Procedure Laterality Date  . COLONOSCOPY WITH PROPOFOL N/A 08/14/2020   Procedure: COLONOSCOPY WITH PROPOFOL;  Surgeon: Rogene Houston, MD;  [facility];  Service: Endoscopy;  Laterality: N/A;  . [place]  after mi  . CYSTOSCOPY WITH RETROGRADE PYELOGRAM, URETEROSCOPY AND STENT PLACEMENT Left 06/28/2014   Procedure: 1ST STAGE CYSTOSCOPY/URETEROSCOPY/STENT PLACEMENT;  Surgeon: Alexis Frock, MD;  [facility];  Service: Urology;  Laterality: Left;  . CYSTOSCOPY WITH STENT PLACEMENT Left 05/08/2014   Procedure: CYSTOSCOPY, RETROGRADE  PYELOGRAM WITH LEFT URETERAL STENT PLACEMENT;  Surgeon: Alexis Frock, MD;  [facility];  Service: Urology;  Laterality: Left;  . ESOPHAGOGASTRODUODENOSCOPY (EGD) WITH PROPOFOL N/A 12/16/2019   Procedure: ESOPHAGOGASTRODUODENOSCOPY (EGD) WITH PROPOFOL;  Surgeon: Mauri Pole, MD;  [facility];  Service: Endoscopy;  Laterality: N/A;  . ESOPHAGOGASTRODUODENOSCOPY (EGD) WITH PROPOFOL N/A 08/13/2020   Procedure: ESOPHAGOGASTRODUODENOSCOPY (EGD) WITH PROPOFOL;  Surgeon: Rogene Houston, MD;  [facility];  Service: Endoscopy;  Laterality: N/A;  . EYE SURGERY Right may 2015   growth removed, july 2015left eye cataract removed, right eye catarct removed  . gamma kniferadiation treatment  [facility] 09 [facility]  . HEMOSTASIS CLIP PLACEMENT  12/16/2019   Procedure: HEMOSTASIS CLIP PLACEMENT;  Surgeon: Mauri Pole, MD;  [facility];  Service: Endoscopy;;  . HEMOSTASIS CONTROL  12/16/2019   Procedure: HEMOSTASIS CONTROL;  Surgeon: Mauri Pole, MD;  [facility];  Service: Endoscopy;;  Endoloop  . HOLMIUM LASER APPLICATION Left 04/28/5168   Procedure: HOLMIUM LASER APPLICATION;  Surgeon: Alexis Frock, MD;  [facility];  Service: Urology;  Laterality: Left;  . LITHOTRIPSY  years ago  . POLYPECTOMY  12/16/2019   Procedure: POLYPECTOMY;  Surgeon: Mauri Pole, MD;  [facility];  Service: Endoscopy;;  . STONE EXTRACTION WITH BASKET Left 06/28/2014   Procedure: STONE EXTRACTION WITH BASKET;  Surgeon: Alexis Frock, MD;  [facility];  Service: Urology;  Laterality: Left;    Family History: Family History  Problem Relation Age of Onset  . Diabetes Mother   . Lung cancer Mother   . CAD Father   . Diabetes Brother   . Diabetes Sister   . Stroke Sister   . Other Maternal Grandfather        brain tumor  . Colon cancer Neg Hx   . Pancreatic cancer Neg Hx   . Esophageal cancer Neg Hx      Social History: Social History   Tobacco Use  Smoking Status Never Smoker  Smokeless Tobacco Never Used   Social History   Substance and Sexual Activity  Alcohol Use No   Social History   Substance and Sexual Activity  Drug Use No    Allergies: Allergies  Allergen Reactions  . Hydrocodone-Acetaminophen   . Morphine And Related Other (See Comments)    Reaction:  Confusion/weakness/hallucinations  . Valsartan Rash    Medications: Current Outpatient Medications  Medication Sig Dispense Refill  . amLODipine (NORVASC) 5 MG tablet TAKE 1 TABLET BY MOUTH DAILY. (Patient taking differently: Take 5 mg by mouth daily.) 90 tablet 0  . apixaban (ELIQUIS) 5 MG TABS tablet Take 1 tablet (5 mg total) by mouth 2 (two) times daily. 180 tablet 3  . clopidogrel (PLAVIX) 75 MG tablet TAKE 1 TABLET ONCE DAILY (Patient taking differently: Take 75 mg by mouth daily.) 90 tablet 1  . dofetilide (TIKOSYN) 500 MCG capsule Take 1 capsule (500 mcg total) by mouth 2 (two) times daily. 180 capsule 3  . escitalopram (LEXAPRO) 20 MG tablet Take 1 tablet (20 mg total) by mouth daily. 30 tablet 5  . Ferrous Sulfate (IRON) 325 (65 Fe) MG  TABS Take 1 tablet by mouth daily.     . furosemide (LASIX) 20 MG tablet Take 1 tablet (20 mg total) by mouth daily. 90 tablet 1  . gabapentin (NEURONTIN) 300 MG capsule Take 1 capsule (300 mg total) by mouth 2 (two) times daily AND 2 capsules (600 mg total) at bedtime. 360 capsule 1  . lisinopril (ZESTRIL) 5 MG tablet TAKE 1 TABLET ONCE DAILY (Patient taking differently: Take 5 mg by mouth daily.) 90 tablet 1  . pantoprazole (PROTONIX) 40 MG tablet Take 1 tablet (40 mg total) by mouth 2 (two) times daily. 180 tablet 1  . pravastatin (PRAVACHOL) 40 MG tablet Take 1 tablet (40 mg total) by mouth daily. 90 tablet 1  . sucralfate (CARAFATE) 1 g tablet Take 1 tablet (1 g total) by mouth 2 (two) times daily. 180 tablet 1  . tamsulosin (FLOMAX) 0.4 MG CAPS capsule Take 1  capsule (0.4 mg total) by mouth daily. 90 capsule 1  . vitamin B-12 (CYANOCOBALAMIN) 1000 MCG tablet Take 1 tablet (1,000 mcg total) by mouth daily. 30 tablet 2   No current facility-administered medications for this visit.    Review of Systems: GENERAL: negative for malaise, night sweats HEENT: No changes in hearing or vision, no nose bleeds or other nasal problems. NECK: Negative for lumps, goiter, pain and significant neck swelling RESPIRATORY: Negative for cough, wheezing CARDIOVASCULAR: Negative for chest pain, leg swelling, palpitations, orthopnea GI: SEE HPI MUSCULOSKELETAL: Negative for joint pain or swelling, back pain, and muscle pain. SKIN: Negative for lesions, rash PSYCH: Negative for sleep disturbance, mood disorder and recent psychosocial stressors. HEMATOLOGY Negative for prolonged bleeding, bruising easily, and swollen nodes. ENDOCRINE: Negative for cold or heat intolerance, polyuria, polydipsia and goiter. NEURO: negative for tremor, gait imbalance, syncope and seizures. The remainder of the review of systems is noncontributory.   Physical Exam: BP 138/65 (BP Location: Left Arm, Patient Position: Sitting, Cuff Size: Small)   Pulse 64   Temp 98 F (36.7 C) (Oral)   Ht 5\' 8"  (1.727 m)   BMI 22.81 kg/m  GENERAL: The patient is AO x3, in no acute distress. Sitting inwheelchair. HEENT: Head is normocephalic and atraumatic. EOMI are intact. Mouth is well hydrated and without lesions. NECK: Supple. No masses LUNGS: Clear to auscultation. No presence of rhonchi/wheezing/rales. Adequate chest expansion HEART: RRR, normal s1 and s2. ABDOMEN: Soft, nontender, no guarding, no peritoneal signs, and nondistended. BS +. No masses. EXTREMITIES: Without any cyanosis, clubbing, rash, lesions or edema. NEUROLOGIC: AOx3, no focal motor deficit. SKIN: no jaundice, no rashes  Imaging/Labs: as above  I personally reviewed and interpreted the available labs, imaging and  endoscopic files.  Impression and Plan: SKYLEN SPIERING is a 80 y.o. male with PMH brain tumor, afib on Eliquis, Guillian Barr and heart failure, who presents for follow up after recent hospitalization for gastrointestinal bleeding.  The patient has presented some episodes of gastrointestinal bleeding for the past which have been clinically self-limited, but he is still presenting recurrent worsening anemia despite taking oral iron supplementation.  It is likely that his intake of Plavix and Eliquis is making these episodes more frequent.  Ideally, he should decrease his dose of Eliquis to the lowest one effective, which he usually discussed with his cardiologist and primary care physician.  For now, we will investigate further his current presentation with a push enteroscopy to evaluate if there is any other lesion explain his recurrent anemia.  If this investigation is inconclusive, will need  to proceed with capsule endoscopy.  - Schedule push enteroscopy - Continue oral iron - Patient will need to ask cardiologist if Eliquis can be decreased to 2.5 mg dosage - Continue stool softener to decrease defecation strain frequency  All questions were answered.      Harvel Quale, MD Gastroenterology and Hepatology [facility]

## 2020-09-05 NOTE — H&P (View-Only) (Signed)
Maylon Peppers, M.D. Gastroenterology & Hepatology [facility] [address] [place], [place] [address]  Primary Care Physician: Loman Brooklyn, [place] [address]  I will communicate my assessment and recommendations to the referring MD via EMR.  Problems: 1. History of gastrointestinal bleeding 2. Symptomatic anemia  History of Present Illness: CHASKA HAGGER is a 80 y.o. male with PMH brain tumor, afib on Eliquis, Guillian Barr and heart failure, who presents for follow up after recent hospitalization for gastrointestinal bleeding.  Patient reports that 8 months ago he had episodes of melena and positive FOBT.  He was presenting some anemia of 10.7 at that time for which she underwent an EGD on 12/16/2019 at [facility].  The patient was found to have a 20 mm polyp ulcerated mucosa in the in the cardia.  A large Endoloop was placed in the polyp stalk.  The polyp was subsequently removed with a hot snare.  2 hemostatic clips were placed successfully but there was persistent oozing from this area.  No further intervention was performed at that time but consultation with interventional radiology was made and the patient was not recommended to have any embolization of the vasculature.  Pathology of the polyp showed hyperplastic polyp with inflammatory changes.  The patient came to the hospital at [facility] on 08/11/2020. He reportsed having significant straining when moving his bowels and he reported having some episodes of rectal bleeding since 07/31/2020. He had rectal bleeding only two times at that time. He states he has been taking a stool softener since then which has helped decreasing his constipation and straining.  Due to drop in his hemoglobin he underwent an EGD and a colonoscopy.  The EGD showed presence of the previously placed clips with no abnormalities up to the third portion of the  duodenum.  Colonoscopy showed hemorrhoids and diverticulosis but no other alterations.  He is currently Eliquis 5 mg for afib and Plavix for stroke prevention.  Notably, the patient was recently sent to the ER on 09/02/2020 after he was found to have worsening anemia in most recent blood work-up performed on the same day.  His hemoglobin dropped down from 8.3-7.4 in 1 day and he was transfused 1 unit PRBC with repeat of 9.1.  Notably his iron stores on 09/01/2020 only showed mildly decreased iron of 36 and iron saturation of 14, ferritin was normal at 79. States that he has been taking iron for the last year and his stools are currently dark brown but not black.   The patient denies having any nausea, vomiting, fever, chills,  hematemesis, abdominal distention, abdominal pain, diarrhea, jaundice, pruritus or weight loss.  FHx: neg for any gastrointestinal/liver disease, no malignancies Social: neg smoking, alcohol or illicit drug use Surgical: no abdominal surgeries  Past Medical History: Past Medical History:  Diagnosis Date  . AAA (abdominal aortic aneurysm) ([place])   . Brain tumor ([place])    x2  . Cardiac arrhythmia   . CHF (congestive heart failure) ([place])   . Guillain-Barre syndrome ([place]) Feb 13 1986  . Hypertension   . Kidney stones   . Myocardial infarction [facility]) 2007    Past Surgical History: Past Surgical History:  Procedure Laterality Date  . COLONOSCOPY WITH PROPOFOL N/A 08/14/2020   Procedure: COLONOSCOPY WITH PROPOFOL;  Surgeon: Rogene Houston, MD;  [facility];  Service: Endoscopy;  Laterality: N/A;  . [place]  after mi  . CYSTOSCOPY WITH RETROGRADE PYELOGRAM, URETEROSCOPY AND STENT PLACEMENT Left 06/28/2014   Procedure: 1ST STAGE CYSTOSCOPY/URETEROSCOPY/STENT PLACEMENT;  Surgeon: Alexis Frock, MD;  [facility];  Service: Urology;  Laterality: Left;  . CYSTOSCOPY WITH STENT PLACEMENT Left 05/08/2014   Procedure: CYSTOSCOPY, RETROGRADE  PYELOGRAM WITH LEFT URETERAL STENT PLACEMENT;  Surgeon: Alexis Frock, MD;  [facility];  Service: Urology;  Laterality: Left;  . ESOPHAGOGASTRODUODENOSCOPY (EGD) WITH PROPOFOL N/A 12/16/2019   Procedure: ESOPHAGOGASTRODUODENOSCOPY (EGD) WITH PROPOFOL;  Surgeon: Mauri Pole, MD;  [facility];  Service: Endoscopy;  Laterality: N/A;  . ESOPHAGOGASTRODUODENOSCOPY (EGD) WITH PROPOFOL N/A 08/13/2020   Procedure: ESOPHAGOGASTRODUODENOSCOPY (EGD) WITH PROPOFOL;  Surgeon: Rogene Houston, MD;  [facility];  Service: Endoscopy;  Laterality: N/A;  . EYE SURGERY Right may 2015   growth removed, july 2015left eye cataract removed, right eye catarct removed  . gamma kniferadiation treatment  Feb 09 2014   [facility]  . HEMOSTASIS CLIP PLACEMENT  12/16/2019   Procedure: HEMOSTASIS CLIP PLACEMENT;  Surgeon: Mauri Pole, MD;  [facility];  Service: Endoscopy;;  . HEMOSTASIS CONTROL  12/16/2019   Procedure: HEMOSTASIS CONTROL;  Surgeon: Mauri Pole, MD;  [facility];  Service: Endoscopy;;  Endoloop  . HOLMIUM LASER APPLICATION Left 1/61/0960   Procedure: HOLMIUM LASER APPLICATION;  Surgeon: Alexis Frock, MD;  [facility];  Service: Urology;  Laterality: Left;  . LITHOTRIPSY  years ago  . POLYPECTOMY  12/16/2019   Procedure: POLYPECTOMY;  Surgeon: Mauri Pole, MD;  [facility];  Service: Endoscopy;;  . STONE EXTRACTION WITH BASKET Left 06/28/2014   Procedure: STONE EXTRACTION WITH BASKET;  Surgeon: Alexis Frock, MD;  [facility];  Service: Urology;  Laterality: Left;    Family History: Family History  Problem Relation Age of Onset  . Diabetes Mother   . Lung cancer Mother   . CAD Father   . Diabetes Brother   . Diabetes Sister   . Stroke Sister   . Other Maternal Grandfather        brain tumor  . Colon cancer Neg Hx   . Pancreatic cancer Neg Hx   . Esophageal cancer Neg Hx      Social History: Social History   Tobacco Use  Smoking Status Never Smoker  Smokeless Tobacco Never Used   Social History   Substance and Sexual Activity  Alcohol Use No   Social History   Substance and Sexual Activity  Drug Use No    Allergies: Allergies  Allergen Reactions  . Hydrocodone-Acetaminophen   . Morphine And Related Other (See Comments)    Reaction:  Confusion/weakness/hallucinations  . Valsartan Rash    Medications: Current Outpatient Medications  Medication Sig Dispense Refill  . amLODipine (NORVASC) 5 MG tablet TAKE 1 TABLET BY MOUTH DAILY. (Patient taking differently: Take 5 mg by mouth daily.) 90 tablet 0  . apixaban (ELIQUIS) 5 MG TABS tablet Take 1 tablet (5 mg total) by mouth 2 (two) times daily. 180 tablet 3  . clopidogrel (PLAVIX) 75 MG tablet TAKE 1 TABLET ONCE DAILY (Patient taking differently: Take 75 mg by mouth daily.) 90 tablet 1  . dofetilide (TIKOSYN) 500 MCG capsule Take 1 capsule (500 mcg total) by mouth 2 (two) times daily. 180 capsule 3  . escitalopram (LEXAPRO) 20 MG tablet Take 1 tablet (20 mg total) by mouth daily. 30 tablet 5  . Ferrous Sulfate (IRON) 325 (65 Fe) MG  TABS Take 1 tablet by mouth daily.     . furosemide (LASIX) 20 MG tablet Take 1 tablet (20 mg total) by mouth daily. 90 tablet 1  . gabapentin (NEURONTIN) 300 MG capsule Take 1 capsule (300 mg total) by mouth 2 (two) times daily AND 2 capsules (600 mg total) at bedtime. 360 capsule 1  . lisinopril (ZESTRIL) 5 MG tablet TAKE 1 TABLET ONCE DAILY (Patient taking differently: Take 5 mg by mouth daily.) 90 tablet 1  . pantoprazole (PROTONIX) 40 MG tablet Take 1 tablet (40 mg total) by mouth 2 (two) times daily. 180 tablet 1  . pravastatin (PRAVACHOL) 40 MG tablet Take 1 tablet (40 mg total) by mouth daily. 90 tablet 1  . sucralfate (CARAFATE) 1 g tablet Take 1 tablet (1 g total) by mouth 2 (two) times daily. 180 tablet 1  . tamsulosin (FLOMAX) 0.4 MG CAPS capsule Take 1  capsule (0.4 mg total) by mouth daily. 90 capsule 1  . vitamin B-12 (CYANOCOBALAMIN) 1000 MCG tablet Take 1 tablet (1,000 mcg total) by mouth daily. 30 tablet 2   No current facility-administered medications for this visit.    Review of Systems: GENERAL: negative for malaise, night sweats HEENT: No changes in hearing or vision, no nose bleeds or other nasal problems. NECK: Negative for lumps, goiter, pain and significant neck swelling RESPIRATORY: Negative for cough, wheezing CARDIOVASCULAR: Negative for chest pain, leg swelling, palpitations, orthopnea GI: SEE HPI MUSCULOSKELETAL: Negative for joint pain or swelling, back pain, and muscle pain. SKIN: Negative for lesions, rash PSYCH: Negative for sleep disturbance, mood disorder and recent psychosocial stressors. HEMATOLOGY Negative for prolonged bleeding, bruising easily, and swollen nodes. ENDOCRINE: Negative for cold or heat intolerance, polyuria, polydipsia and goiter. NEURO: negative for tremor, gait imbalance, syncope and seizures. The remainder of the review of systems is noncontributory.   Physical Exam: BP 138/65 (BP Location: Left Arm, Patient Position: Sitting, Cuff Size: Small)   Pulse 64   Temp 98 F (36.7 C) (Oral)   Ht 5\' 8"  (1.727 m)   BMI 22.81 kg/m  GENERAL: The patient is AO x3, in no acute distress. Sitting inwheelchair. HEENT: Head is normocephalic and atraumatic. EOMI are intact. Mouth is well hydrated and without lesions. NECK: Supple. No masses LUNGS: Clear to auscultation. No presence of rhonchi/wheezing/rales. Adequate chest expansion HEART: RRR, normal s1 and s2. ABDOMEN: Soft, nontender, no guarding, no peritoneal signs, and nondistended. BS +. No masses. EXTREMITIES: Without any cyanosis, clubbing, rash, lesions or edema. NEUROLOGIC: AOx3, no focal motor deficit. SKIN: no jaundice, no rashes  Imaging/Labs: as above  I personally reviewed and interpreted the available labs, imaging and  endoscopic files.  Impression and Plan: BAY WAYSON is a 80 y.o. male with PMH brain tumor, afib on Eliquis, Guillian Barr and heart failure, who presents for follow up after recent hospitalization for gastrointestinal bleeding.  The patient has presented some episodes of gastrointestinal bleeding for the past which have been clinically self-limited, but he is still presenting recurrent worsening anemia despite taking oral iron supplementation.  It is likely that his intake of Plavix and Eliquis is making these episodes more frequent.  Ideally, he should decrease his dose of Eliquis to the lowest one effective, which he usually discussed with his cardiologist and primary care physician.  For now, we will investigate further his current presentation with a push enteroscopy to evaluate if there is any other lesion explain his recurrent anemia.  If this investigation is inconclusive, will need  to proceed with capsule endoscopy.  - Schedule push enteroscopy - Continue oral iron - Patient will need to ask cardiologist if Eliquis can be decreased to 2.5 mg dosage - Continue stool softener to decrease defecation strain frequency  All questions were answered.      Harvel Quale, MD Gastroenterology and Hepatology [facility]

## 2020-09-06 ENCOUNTER — Ambulatory Visit: Payer: Medicare Other | Admitting: Family Medicine

## 2020-09-07 DIAGNOSIS — H5032 Intermittent alternating esotropia: Secondary | ICD-10-CM | POA: Diagnosis not present

## 2020-09-12 NOTE — Patient Instructions (Signed)
Theodore Mendoza  09/12/2020     @PREFPERIOPPHARMACY @   Your procedure is scheduled on  09/16/2020.   Report to [facility] at  1110  A.M.   Call this number if you have problems the morning of surgery:  [phone]   Remember:  Follow the diet instructions given to you by the office.  Your last dose of eliquis should be 09/13/2020.  Your last dose of plavix should have been 09/10/2020.     Take these medicines the morning of surgery with A SIP OF WATER     tikosyn,lexapro, gabapentin, protonix, flomax.     Please brush your teeth.  Do not wear jewelry, make-up or nail polish.  Do not wear lotions, powders, or perfumes, or deodorant.  Do not shave 48 hours prior to surgery.  Men may shave face and neck.  Do not bring valuables to the hospital.  [facility] is not responsible for any belongings or valuables.  Contacts, dentures or bridgework may not be worn into surgery.  Leave your suitcase in the car.  After surgery it may be brought to your room.  For patients admitted to the hospital, discharge time will be determined by your treatment team.  Patients discharged the day of surgery will not be allowed to drive home and must have someone with them for 24 hours.    Special instructions:  DO NOT smoke tobacco or vape for 24 hours before your procedure.  Please read over the following fact sheets that you were given. Anesthesia Post-op Instructions and Care and Recovery After Surgery      Upper Endoscopy, Adult, Care After This sheet gives you information about how to care for yourself after your procedure. Your health care provider may also give you more specific instructions. If you have problems or questions, contact your health careprovider. What can I expect after the procedure? After the procedure, it is common to have: A sore throat. Mild stomach pain or discomfort. Bloating. Nausea. Follow these instructions at home:  Follow instructions from your  health care provider about what to eat or drink after your procedure. Return to your normal activities as told by your health care provider. Ask your health care provider what activities are safe for you. Take over-the-counter and prescription medicines only as told by your health care provider. If you were given a sedative during the procedure, it can affect you for several hours. Do not drive or operate machinery until your health care provider says that it is safe. Keep all follow-up visits as told by your health care provider. This is important. Contact a health care provider if you have: A sore throat that lasts longer than one day. Trouble swallowing. Get help right away if: You vomit blood or your vomit looks like coffee grounds. You have: A fever. Bloody, black, or tarry stools. A severe sore throat or you cannot swallow. Difficulty breathing. Severe pain in your chest or abdomen. Summary After the procedure, it is common to have a sore throat, mild stomach discomfort, bloating, and nausea. If you were given a sedative during the procedure, it can affect you for several hours. Do not drive or operate machinery until your health care provider says that it is safe. Follow instructions from your health care provider about what to eat or drink after your procedure. Return to your normal activities as told by your health care provider. This information is not intended to replace advice given to you  by your health care provider. Make sure you discuss any questions you have with your healthcare provider. Document Revised: 03/17/2019 Document Reviewed: 08/19/2017 Elsevier Patient Education  2022 [employer] [employer] After This sheet gives you information about how to care for yourself after your procedure. Your health care provider may also give you more specific instructions. If you have problems or questions, contact your health careprovider. What can I expect  after the procedure? After the procedure, it is common to have: Tiredness. Forgetfulness about what happened after the procedure. Impaired judgment for important decisions. Nausea or vomiting. Some difficulty with balance. Follow these instructions at home: For the time period you were told by your health care provider:     Rest as needed. Do not participate in activities where you could fall or become injured. Do not drive or use machinery. Do not drink alcohol. Do not take sleeping pills or medicines that cause drowsiness. Do not make important decisions or sign legal documents. Do not take care of children on your own. Eating and drinking Follow the diet that is recommended by your health care provider. Drink enough fluid to keep your urine pale yellow. If you vomit: Drink water, juice, or soup when you can drink without vomiting. Make sure you have little or no nausea before eating solid foods. General instructions Have a responsible adult stay with you for the time you are told. It is important to have someone help care for you until you are awake and alert. Take over-the-counter and prescription medicines only as told by your health care provider. If you have sleep apnea, surgery and certain medicines can increase your risk for breathing problems. Follow instructions from your health care provider about wearing your sleep device: Anytime you are sleeping, including during daytime naps. While taking prescription pain medicines, sleeping medicines, or medicines that make you drowsy. Avoid smoking. Keep all follow-up visits as told by your health care provider. This is important. Contact a health care provider if: You keep feeling nauseous or you keep vomiting. You feel light-headed. You are still sleepy or having trouble with balance after 24 hours. You develop a rash. You have a fever. You have redness or swelling around the IV site. Get help right away if: You have  trouble breathing. You have new-onset confusion at home. Summary For several hours after your procedure, you may feel tired. You may also be forgetful and have poor judgment. Have a responsible adult stay with you for the time you are told. It is important to have someone help care for you until you are awake and alert. Rest as told. Do not drive or operate machinery. Do not drink alcohol or take sleeping pills. Get help right away if you have trouble breathing, or if you suddenly become confused. This information is not intended to replace advice given to you by your health care provider. Make sure you discuss any questions you have with your healthcare provider. Document Revised: 12/03/2019 Document Reviewed: 02/19/2019 Elsevier Patient Education  2022 [employer].

## 2020-09-14 ENCOUNTER — Encounter (HOSPITAL_COMMUNITY): Payer: Self-pay

## 2020-09-14 ENCOUNTER — Encounter (HOSPITAL_COMMUNITY)
Admission: RE | Admit: 2020-09-14 | Discharge: 2020-09-14 | Disposition: A | Discharge status: Home / Self Care | Payer: Medicare Other | Source: Ambulatory Visit | Attending: Gastroenterology | Admitting: Gastroenterology

## 2020-09-14 ENCOUNTER — Other Ambulatory Visit: Payer: Self-pay

## 2020-09-14 ENCOUNTER — Other Ambulatory Visit (HOSPITAL_COMMUNITY)
Admission: RE | Admit: 2020-09-14 | Discharge: 2020-09-14 | Disposition: A | Discharge status: Home / Self Care | Payer: Medicare Other | Source: Ambulatory Visit | Attending: Gastroenterology | Admitting: Gastroenterology

## 2020-09-14 DIAGNOSIS — Z7901 Long term (current) use of anticoagulants: Secondary | ICD-10-CM | POA: Diagnosis not present

## 2020-09-14 DIAGNOSIS — I4891 Unspecified atrial fibrillation: Secondary | ICD-10-CM | POA: Diagnosis not present

## 2020-09-14 DIAGNOSIS — Z01812 Encounter for preprocedural laboratory examination: Secondary | ICD-10-CM | POA: Insufficient documentation

## 2020-09-14 DIAGNOSIS — I10 Essential (primary) hypertension: Secondary | ICD-10-CM | POA: Insufficient documentation

## 2020-09-14 DIAGNOSIS — D62 Acute posthemorrhagic anemia: Secondary | ICD-10-CM | POA: Diagnosis not present

## 2020-09-14 DIAGNOSIS — K625 Hemorrhage of anus and rectum: Secondary | ICD-10-CM | POA: Diagnosis not present

## 2020-09-14 LAB — CBC WITH DIFFERENTIAL/PLATELET
Abs Immature Granulocytes: 0.01 10*3/uL (ref 0.00–0.07)
Basophils Absolute: 0 10*3/uL (ref 0.0–0.1)
Basophils Relative: 1 %
Eosinophils Absolute: 0.1 10*3/uL (ref 0.0–0.5)
Eosinophils Relative: 3 %
HCT: 29.2 % — ABNORMAL LOW (ref 39.0–52.0)
Hemoglobin: 8.7 g/dL — ABNORMAL LOW (ref 13.0–17.0)
Immature Granulocytes: 0 %
Lymphocytes Relative: 25 %
Lymphs Abs: 1 10*3/uL (ref 0.7–4.0)
MCH: 27.9 pg (ref 26.0–34.0)
MCHC: 29.8 g/dL — ABNORMAL LOW (ref 30.0–36.0)
MCV: 93.6 fL (ref 80.0–100.0)
Monocytes Absolute: 0.2 10*3/uL (ref 0.1–1.0)
Monocytes Relative: 5 %
Neutro Abs: 2.6 10*3/uL (ref 1.7–7.7)
Neutrophils Relative %: 66 %
Platelets: 178 10*3/uL (ref 150–400)
RBC: 3.12 MIL/uL — ABNORMAL LOW (ref 4.22–5.81)
RDW: 14.8 % (ref 11.5–15.5)
WBC: 4.1 10*3/uL (ref 4.0–10.5)
nRBC: 0 % (ref 0.0–0.2)

## 2020-09-16 ENCOUNTER — Ambulatory Visit (HOSPITAL_COMMUNITY)
Admission: RE | Admit: 2020-09-16 | Discharge: 2020-09-16 | Disposition: A | Discharge status: Home / Self Care | Payer: Medicare Other | Attending: Gastroenterology | Admitting: Gastroenterology

## 2020-09-16 ENCOUNTER — Encounter (HOSPITAL_COMMUNITY): Payer: Self-pay | Admitting: Gastroenterology

## 2020-09-16 ENCOUNTER — Other Ambulatory Visit: Payer: Self-pay

## 2020-09-16 ENCOUNTER — Ambulatory Visit (HOSPITAL_COMMUNITY): Payer: Medicare Other | Admitting: Certified Registered"

## 2020-09-16 ENCOUNTER — Encounter (HOSPITAL_COMMUNITY)
Admission: RE | Disposition: A | Discharge status: Home / Self Care | Payer: Self-pay | Source: Home / Self Care | Attending: Gastroenterology

## 2020-09-16 DIAGNOSIS — Z888 Allergy status to other drugs, medicaments and biological substances status: Secondary | ICD-10-CM | POA: Insufficient documentation

## 2020-09-16 DIAGNOSIS — Z8249 Family history of ischemic heart disease and other diseases of the circulatory system: Secondary | ICD-10-CM | POA: Insufficient documentation

## 2020-09-16 DIAGNOSIS — Z801 Family history of malignant neoplasm of trachea, bronchus and lung: Secondary | ICD-10-CM | POA: Insufficient documentation

## 2020-09-16 DIAGNOSIS — D649 Anemia, unspecified: Secondary | ICD-10-CM | POA: Diagnosis not present

## 2020-09-16 DIAGNOSIS — K922 Gastrointestinal hemorrhage, unspecified: Secondary | ICD-10-CM

## 2020-09-16 DIAGNOSIS — Z885 Allergy status to narcotic agent status: Secondary | ICD-10-CM | POA: Insufficient documentation

## 2020-09-16 DIAGNOSIS — T182XXA Foreign body in stomach, initial encounter: Secondary | ICD-10-CM

## 2020-09-16 DIAGNOSIS — Z833 Family history of diabetes mellitus: Secondary | ICD-10-CM | POA: Insufficient documentation

## 2020-09-16 DIAGNOSIS — I509 Heart failure, unspecified: Secondary | ICD-10-CM | POA: Insufficient documentation

## 2020-09-16 DIAGNOSIS — Z7902 Long term (current) use of antithrombotics/antiplatelets: Secondary | ICD-10-CM | POA: Diagnosis not present

## 2020-09-16 DIAGNOSIS — Z79899 Other long term (current) drug therapy: Secondary | ICD-10-CM | POA: Diagnosis not present

## 2020-09-16 DIAGNOSIS — Z7901 Long term (current) use of anticoagulants: Secondary | ICD-10-CM | POA: Insufficient documentation

## 2020-09-16 DIAGNOSIS — I4891 Unspecified atrial fibrillation: Secondary | ICD-10-CM | POA: Diagnosis not present

## 2020-09-16 DIAGNOSIS — I110 Hypertensive heart disease with heart failure: Secondary | ICD-10-CM | POA: Insufficient documentation

## 2020-09-16 HISTORY — PX: ESOPHAGOGASTRODUODENOSCOPY (EGD) WITH PROPOFOL: SHX5813

## 2020-09-16 HISTORY — PX: ENTEROSCOPY: SHX5533

## 2020-09-16 SURGERY — ESOPHAGOGASTRODUODENOSCOPY (EGD) WITH PROPOFOL
Anesthesia: General

## 2020-09-16 MED ORDER — PROPOFOL 500 MG/50ML IV EMUL
INTRAVENOUS | Status: DC | PRN
  Administered 2020-09-16: 125 ug/kg/min via INTRAVENOUS

## 2020-09-16 MED ORDER — IRON 325 (65 FE) MG PO TABS: 325.0000 mg | ORAL_TABLET | Freq: Every day | ORAL | 2 refills | Status: DC

## 2020-09-16 MED ORDER — PROPOFOL 10 MG/ML IV BOLUS
INTRAVENOUS | Status: DC | PRN
  Administered 2020-09-16: 60 mg via INTRAVENOUS

## 2020-09-16 MED ORDER — PANTOPRAZOLE SODIUM 40 MG PO TBEC: 40.0000 mg | DELAYED_RELEASE_TABLET | Freq: Every day | ORAL | 3 refills | Status: DC

## 2020-09-16 MED ORDER — LACTATED RINGERS IV SOLN: INTRAVENOUS | Status: DC | PRN

## 2020-09-16 MED ORDER — GLUCAGON HCL RDNA (DIAGNOSTIC) 1 MG IJ SOLR
INTRAMUSCULAR | Status: AC
  Filled 2020-09-16: qty 1

## 2020-09-16 MED ORDER — GLUCAGON HCL RDNA (DIAGNOSTIC) 1 MG IJ SOLR
INTRAMUSCULAR | Status: DC | PRN
  Administered 2020-09-16: .5 mg via INTRAVENOUS

## 2020-09-16 NOTE — Pre-Procedure Instructions (Signed)
        no show Received: 2 days ago Encarnacion Chu, RN  [place], Michelene Gardener, CMA Good afternoon Darius Bump. Theodore Mendoza did not show for his PAT today.

## 2020-09-16 NOTE — Op Note (Signed)
[facility] Patient Name: Theodore Mendoza Procedure Date: 09/16/2020 1:15 PM MRN: [id number] Date of Birth: 1940-07-24 Attending MD: Maylon Peppers ,  CSN: [id number] Age: 80 Admit Type: Outpatient Procedure:                Small bowel enteroscopy Indications:              Obscure gastrointestinal bleeding Providers:                Maylon Peppers, [place] Page, Moquino Risa Grill, Technician Referring MD:              Medicines:                Monitored Anesthesia Care Complications:            No immediate complications. Estimated Blood Loss:     Estimated blood loss: none. Procedure:                After obtaining informed consent, the endoscope was                            passed under direct vision. Throughout the                            procedure, the patient's blood pressure, pulse, and                            oxygen saturations were monitored continuously. The                            [id number]-[id number] ([id number]) scope was introduced through                            the mouth and advanced to the proximal jejunum. The                            small bowel enteroscopy was accomplished without                            difficulty. The patient tolerated the procedure                            well. Scope In: 1:36:50 PM Scope Out: 1:55:13 PM Total Procedure Duration: 0 hours 18 minutes 23 seconds  Findings:      The examined esophagus was normal.      An endoclip was found in the gastric body in the area of previous       polypectomy.      There was no evidence of significant pathology in the entire examined       duodenum.      There was no evidence of significant pathology in the proximal jejunum. Impression:               - Normal esophagus.                           -  An endoclip was found in the stomach.                           - Normal examined duodenum.                           - The examined portion of the jejunum was  normal.                           - No specimens collected. Moderate Sedation:      Per Anesthesia Care Recommendation:           - Discharge patient to home (ambulatory).                           - Resume previous diet.                           - Increase ferrous sulfate to 325 mg twice a day.                           - Schedule capsule endoscopy.                           - Restart Plavix and Eliquis today. Procedure Code(s):        --- Professional ---                           [phone], Small intestinal endoscopy, enteroscopy                            beyond second portion of duodenum, not including                            ileum; diagnostic, including collection of                            specimen(s) by brushing or washing, when performed                            (separate procedure) Diagnosis Code(s):        --- Professional ---                           X38.1WEX, Foreign body in stomach, initial encounter                           K92.2, Gastrointestinal hemorrhage, unspecified CPT copyright 2019 American Medical Association. All rights reserved. The codes documented in this report are preliminary and upon coder review may  be revised to meet current compliance requirements. Maylon Peppers, MD Maylon Peppers,  09/16/2020 2:05:37 PM This report has been signed electronically. Number of Addenda: 0

## 2020-09-16 NOTE — Anesthesia Preprocedure Evaluation (Signed)
Anesthesia Evaluation  Patient identified by MRN, date of birth, ID band Patient awake    Reviewed: Allergy & Precautions, NPO status , Patient's Chart, lab work & pertinent test results  Airway Mallampati: II  TM Distance: >3 FB Neck ROM: Full   Comment: H/o tracheostomy  Dental  (+) Dental Advisory Given, Missing   Pulmonary  Tracheostomy after GBS  H/o tracheostomy Pulmonary exam normal breath sounds clear to auscultation       Cardiovascular Exercise Tolerance: Poor METS (Paraplegic): hypertension, Pt. on medications + Past MI and +CHF  Normal cardiovascular exam+ dysrhythmias Atrial Fibrillation  Rhythm:Regular Rate:Normal  11-Aug-2020 08:10:34 [facility]rum System-AP-ER ROUTINE RECORD [id number] (50 yr) Male Caucasian Room:ER2 Loc:499 Technician: VSP Test ind: Comment 1:: Comment 2:: Comment 3:: Comment 4:: Vent. rate 84 BPM PR interval 173 ms QRS duration 99 ms QT/QTcB 387/458 ms P-R-T axes 74 18 52 Sinus rhythm No significant change since last tracing Confirmed by Lacretia Leigh ([id number]) on 08/12/2020 10:50:07 AM   Neuro/Psych TIA Neuromuscular disease (Gullian Barre Syndrome, incomplete paraplegia) negative psych ROS   GI/Hepatic negative GI ROS, Neg liver ROS,   Endo/Other  negative endocrine ROS  Renal/GU Renal disease     Musculoskeletal negative musculoskeletal ROS (+)   Abdominal   Peds  Hematology  (+) anemia ,   Anesthesia Other Findings   Reproductive/Obstetrics negative OB ROS                             Anesthesia Physical  Anesthesia Plan  ASA: IV  Anesthesia Plan: General   Post-op Pain Management:    Induction: Intravenous  PONV Risk Score and Plan: Propofol infusion  Airway Management Planned: Nasal Cannula and Natural Airway  Additional Equipment:   Intra-op Plan:   Post-operative Plan:   Informed Consent: I have reviewed the  patients History and Physical, chart, labs and discussed the procedure including the risks, benefits and alternatives for the proposed anesthesia with the patient or authorized representative who has indicated his/her understanding and acceptance.     Dental advisory given  Plan Discussed with: Surgeon  Anesthesia Plan Comments:         Anesthesia Quick Evaluation

## 2020-09-16 NOTE — Interval H&P Note (Signed)
History and Physical Interval Note:  09/16/2020 1:16 PM Theodore Mendoza is a 80 y.o. male with PMH brain tumor, afib on Eliquis, Guillian Barr and heart failure, who presents for follow up after recent hospitalization for gastrointestinal bleeding.  Patient denies baving any abdominal pain, nausea, vomiting, fever, chills, melena or hematochezia.  His hemoglobin has remained stable with most recent value of 8.7 on 09/14/2020.  BP (!) 165/73   Pulse 60   Temp 98 F (36.7 C) (Oral)   Resp 16   Wt 68 kg   SpO2 99%   BMI 22.81 kg/m  GENERAL: The patient is AO x3, in no acute distress. HEENT: Head is normocephalic and atraumatic. EOMI are intact. Mouth is well hydrated and without lesions. NECK: Supple. No masses LUNGS: Clear to auscultation. No presence of rhonchi/wheezing/rales. Adequate chest expansion HEART: RRR, normal s1 and s2. ABDOMEN: Soft, nontender, no guarding, no peritoneal signs, and nondistended. BS +. No masses. EXTREMITIES: Without any cyanosis, clubbing, rash, lesions or edema. NEUROLOGIC: AOx3, no focal motor deficit. SKIN: no jaundice, no rashes  [name]  has presented today for surgery, with the diagnosis of GI Bleeding Anemia.  The various methods of treatment have been discussed with the patient and family. After consideration of risks, benefits and other options for treatment, the patient has consented to  Procedure(s) with comments: ESOPHAGOGASTRODUODENOSCOPY (EGD) WITH PROPOFOL (N/A) - 1:55 PUSH ENTEROSCOPY (N/A) as a surgical intervention.  The patient's history has been reviewed, patient examined, no change in status, stable for surgery.  I have reviewed the patient's chart and labs.  Questions were answered to the patient's satisfaction.     [name]

## 2020-09-16 NOTE — Anesthesia Postprocedure Evaluation (Signed)
Anesthesia Post Note  Patient: Theodore Mendoza  Procedure(s) Performed: ESOPHAGOGASTRODUODENOSCOPY (EGD) WITH PROPOFOL PUSH ENTEROSCOPY  Patient location during evaluation: Phase II Anesthesia Type: General Level of consciousness: awake Pain management: pain level controlled Vital Signs Assessment: post-procedure vital signs reviewed and stable Respiratory status: spontaneous breathing and respiratory function stable Cardiovascular status: blood pressure returned to baseline and stable Postop Assessment: no headache and no apparent nausea or vomiting Anesthetic complications: no Comments: Late entry   No notable events documented.   Last Vitals:  Vitals:   09/16/20 1359 09/16/20 1404  BP: 106/86 133/60  Pulse: 84 87  Resp: 14 13  Temp: 36.9 C   SpO2: 93% 94%    Last Pain:  Vitals:   09/16/20 1404  TempSrc:   PainSc: 0-No pain                 [name]

## 2020-09-16 NOTE — Transfer of Care (Signed)
Immediate Anesthesia Transfer of Care Note  Patient: Theodore Mendoza  Procedure(s) Performed: ESOPHAGOGASTRODUODENOSCOPY (EGD) WITH PROPOFOL PUSH ENTEROSCOPY  Patient Location: PACU and Short Stay  Anesthesia Type:General  Level of Consciousness: drowsy  Airway & Oxygen Therapy: Patient Spontanous Breathing  Post-op Assessment: Report given to RN and Post -op Vital signs reviewed and stable  Post vital signs: Reviewed and stable  Last Vitals:  Vitals Value Taken Time  BP    Temp    Pulse    Resp    SpO2      Last Pain:  Vitals:   09/16/20 1331  TempSrc:   PainSc: 0-No pain         Complications: No notable events documented.

## 2020-09-16 NOTE — Discharge Instructions (Addendum)
You are being discharged to home.  Resume your previous diet.  Increase ferrous sulfate to 325 mg twice a day. Schedule capsule endoscopy. Restart Plavix and Eliquis today.

## 2020-09-19 ENCOUNTER — Encounter (INDEPENDENT_AMBULATORY_CARE_PROVIDER_SITE_OTHER): Payer: Self-pay

## 2020-09-22 ENCOUNTER — Ambulatory Visit (INDEPENDENT_AMBULATORY_CARE_PROVIDER_SITE_OTHER): Payer: Medicare Other | Admitting: Gastroenterology

## 2020-09-22 ENCOUNTER — Encounter (HOSPITAL_COMMUNITY): Payer: Self-pay | Admitting: Gastroenterology

## 2020-09-26 ENCOUNTER — Other Ambulatory Visit: Payer: Self-pay | Admitting: Surgery

## 2020-09-26 ENCOUNTER — Ambulatory Visit (HOSPITAL_COMMUNITY)
Admission: RE | Admit: 2020-09-26 | Discharge: 2020-09-26 | Disposition: A | Discharge status: Home / Self Care | Payer: Medicare Other | Source: Ambulatory Visit | Attending: Surgery | Admitting: Surgery

## 2020-09-26 ENCOUNTER — Other Ambulatory Visit (HOSPITAL_COMMUNITY): Payer: Self-pay | Admitting: Surgery

## 2020-09-26 ENCOUNTER — Encounter: Payer: Self-pay | Admitting: Surgery

## 2020-09-26 ENCOUNTER — Ambulatory Visit (INDEPENDENT_AMBULATORY_CARE_PROVIDER_SITE_OTHER)
Admission: RE | Admit: 2020-09-26 | Discharge: 2020-09-26 | Disposition: A | Discharge status: Home / Self Care | Payer: Medicare Other | Source: Ambulatory Visit | Attending: Surgery | Admitting: Surgery

## 2020-09-26 ENCOUNTER — Ambulatory Visit: Payer: Medicare Other | Admitting: Surgery

## 2020-09-26 ENCOUNTER — Other Ambulatory Visit: Payer: Self-pay

## 2020-09-26 VITALS — BP 105/57 | HR 55 | Temp 97.9°F | Resp 20 | Ht 68.0 in | Wt 150.0 lb

## 2020-09-26 DIAGNOSIS — Z01818 Encounter for other preprocedural examination: Secondary | ICD-10-CM

## 2020-09-26 DIAGNOSIS — I714 Abdominal aortic aneurysm, without rupture, unspecified: Secondary | ICD-10-CM

## 2020-09-26 NOTE — Progress Notes (Signed)
[facility]  Patient name: Theodore Mendoza MRN: [id number] DOB: [birth year] Sex: male   REASON FOR VISIT:    Follow up  HISOTRY OF PRESENT ILLNESS:     Theodore Mendoza is a 80 y.o. male, who I saw 6 months ago for a AAA.[name]  This was detected on a CT scan that was performed in September 2021 for a GI bleed.  At that time maximum diameter was 4.5 cm.  He denies abdominal pain or back pain.  He is having issues with GI bleeding and is scheduled for endoscopy in the near future.   Patient has a history of coronary artery disease status post PCI and 2008 for a NSTEMI.  He is on anticoagulation for atrial fibrillation.  He has undergone cardioversion.  He also has a history of TIA in 2017.  He has been diagnosed with Guillain-Barr syndrome in 1987.  He has a meningioma that is being followed by neurosurgery.  PAST MEDICAL HISTORY:   Past Medical History:  Diagnosis Date   AAA (abdominal aortic aneurysm) ([place])    Brain tumor ([place])    x2   Cardiac arrhythmia    CHF (congestive heart failure) ([place])    Guillain-Barre syndrome ([facility]) Feb 13 1986   Hypertension    Kidney stones    Myocardial infarction [facility]) 2007     FAMILY HISTORY:   Family History  Problem Relation Age of Onset   Diabetes Mother    Lung cancer Mother    CAD Father    Diabetes Brother    Diabetes Sister    Stroke Sister    Other Maternal Grandfather        brain tumor   Colon cancer Neg Hx    Pancreatic cancer Neg Hx    Esophageal cancer Neg Hx     SOCIAL HISTORY:   Social History   Tobacco Use   Smoking status: Never   Smokeless tobacco: Never  Substance Use Topics   Alcohol use: No     ALLERGIES:   Allergies  Allergen Reactions   Hydrocodone-Acetaminophen    Morphine And Related Other (See Comments)    Reaction:  Confusion/weakness/hallucinations   Valsartan Rash     CURRENT MEDICATIONS:   Current Outpatient Medications   Medication Sig Dispense Refill   apixaban (ELIQUIS) 5 MG TABS tablet Take 1 tablet (5 mg total) by mouth 2 (two) times daily. 180 tablet 3   clopidogrel (PLAVIX) 75 MG tablet TAKE 1 TABLET ONCE DAILY 90 tablet 1   dofetilide (TIKOSYN) 500 MCG capsule Take 1 capsule (500 mcg total) by mouth 2 (two) times daily. 180 capsule 3   escitalopram (LEXAPRO) 20 MG tablet Take 1 tablet (20 mg total) by mouth daily. 30 tablet 5   Ferrous Sulfate (IRON) 325 (65 Fe) MG TABS Take 1 tablet (325 mg total) by mouth daily. 180 tablet 2   furosemide (LASIX) 20 MG tablet Take 1 tablet (20 mg total) by mouth daily. 90 tablet 1   gabapentin (NEURONTIN) 300 MG capsule Take 1 capsule (300 mg total) by mouth 2 (two) times daily AND 2 capsules (600 mg total) at bedtime. (Patient taking differently: Take 300 mg by mouth in the morning and at dinner 600 mg at bedtime.) 360 capsule 1   lisinopril (ZESTRIL) 5 MG tablet TAKE 1 TABLET ONCE DAILY (Patient taking differently: Take 5 mg by mouth daily.) 90 tablet 1   pantoprazole (PROTONIX) 40 MG tablet Take 1 tablet (  40 mg total) by mouth daily. 90 tablet 3   pravastatin (PRAVACHOL) 40 MG tablet Take 1 tablet (40 mg total) by mouth daily. 90 tablet 1   sucralfate (CARAFATE) 1 g tablet Take 1 tablet (1 g total) by mouth 2 (two) times daily. 180 tablet 1   tamsulosin (FLOMAX) 0.4 MG CAPS capsule Take 1 capsule (0.4 mg total) by mouth daily. 90 capsule 1   vitamin B-12 (CYANOCOBALAMIN) 1000 MCG tablet Take 1 tablet (1,000 mcg total) by mouth daily. 30 tablet 2   amLODipine (NORVASC) 5 MG tablet TAKE 1 TABLET BY MOUTH DAILY. (Patient not taking: No sig reported) 90 tablet 0   No current facility-administered medications for this visit.    REVIEW OF SYSTEMS:   [X]  denotes positive finding, [ ]  denotes negative finding Cardiac  Comments:  Chest pain or chest pressure:    Shortness of breath upon exertion:    Short of breath when lying flat:    Irregular heart rhythm:         Vascular    Pain in calf, thigh, or hip brought on by ambulation:    Pain in feet at night that wakes you up from your sleep:     Blood clot in your veins:    Leg swelling:         Pulmonary    Oxygen at home:    Productive cough:     Wheezing:         Neurologic    Sudden weakness in arms or legs:     Sudden numbness in arms or legs:     Sudden onset of difficulty speaking or slurred speech:    Temporary loss of vision in one eye:     Problems with dizziness:         Gastrointestinal    Blood in stool:     Vomited blood:         Genitourinary    Burning when urinating:     Blood in urine:        Psychiatric    Major depression:         Hematologic    Bleeding problems:    Problems with blood clotting too easily:        Skin    Rashes or ulcers:        Constitutional    Fever or chills:      PHYSICAL EXAM:   Vitals:   09/26/20 1007  BP: (!) 105/57  Pulse: (!) 55  Resp: 20  Temp: 97.9 F (36.6 C)  SpO2: 97%  Weight: 150 lb (68 kg)  Height: 5\' 8"  (1.727 m)    GENERAL: The patient is a well-nourished male, in no acute distress. The vital signs are documented above. CARDIAC: There is a regular rate and rhythm.  PULMONARY: Non-labored respirations ABDOMEN: Soft and non-tender  I have MUSCULOSKELETAL: There are no major deformities or cyanosis. NEUROLOGIC: No focal weakness or paresthesias are detected. SKIN: There are no ulcers or rashes noted. PSYCHIATRIC: The patient has a normal affect.  STUDIES:   I have reviewed the following studies:  Abdominal Aorta: There is evidence of abnormal dilatation of the distal  Abdominal aorta. The largest aortic diameter remains essentially unchanged  compared to prior exam. Previous diameter measurement was obtained on  03/21/20. Max diameter is 4.7 cm    Right Carotid: Velocities in the right ICA are consistent with a 1-39%  stenosis.   Left Carotid: Velocities in the  left ICA are consistent with a 1-39%   stenosis.   ABI Findings:  +---------+------------------+-----+----------+--------+  Right    Rt Pressure (mmHg)IndexWaveform  Comment   +---------+------------------+-----+----------+--------+  Brachial 116                                        +---------+------------------+-----+----------+--------+  ATA      102               0.86 biphasic            +---------+------------------+-----+----------+--------+  PTA      109               0.92 monophasic          +---------+------------------+-----+----------+--------+  Great Toe95                0.80 Normal              +---------+------------------+-----+----------+--------+   +---------+------------------+-----+----------+-------+  Left     Lt Pressure (mmHg)IndexWaveform  Comment  +---------+------------------+-----+----------+-------+  Brachial 119                                       +---------+------------------+-----+----------+-------+  ATA      101               0.85 monophasic         +---------+------------------+-----+----------+-------+  PTA      99                0.83 monophasic         +---------+------------------+-----+----------+-------+  Great Toe80                0.67 Abnormal           +---------+------------------+-----+----------+-------+   Right: Total occlusion noted in the superficial femoral artery.   Left: Total occlusion noted in the superficial femoral artery.   MEDICAL ISSUES:   AAA: Maximum diameter is 4.7 cm.  He will come back in 6 months for surveillance imaging.  If at that time, it remains less than 5 cm, I would schedule him to see me back in another 6 months with a CT angiogram of the chest abdomen pelvis.  Carotid: No significant stenosis  PAD: The patient has bilateral superficial femoral artery occlusions.  He is asymptomatic.  I did discuss that if he develops a wound, he would need to let me know because he may require  revascularization for limb salvage.    Leia Alf, MD, FACS [facility] [phone] Pager [phone]

## 2020-09-28 ENCOUNTER — Other Ambulatory Visit: Payer: Self-pay

## 2020-09-28 DIAGNOSIS — I714 Abdominal aortic aneurysm, without rupture, unspecified: Secondary | ICD-10-CM

## 2020-10-11 ENCOUNTER — Encounter (HOSPITAL_COMMUNITY)
Admission: RE | Disposition: A | Discharge status: Home / Self Care | Payer: Self-pay | Source: Home / Self Care | Attending: Gastroenterology

## 2020-10-11 ENCOUNTER — Ambulatory Visit (HOSPITAL_COMMUNITY)
Admission: RE | Admit: 2020-10-11 | Discharge: 2020-10-11 | Disposition: A | Discharge status: Home / Self Care | Payer: Medicare Other | Attending: Gastroenterology | Admitting: Gastroenterology

## 2020-10-11 DIAGNOSIS — D500 Iron deficiency anemia secondary to blood loss (chronic): Secondary | ICD-10-CM | POA: Diagnosis not present

## 2020-10-11 DIAGNOSIS — K31819 Angiodysplasia of stomach and duodenum without bleeding: Secondary | ICD-10-CM | POA: Diagnosis not present

## 2020-10-11 HISTORY — PX: GIVENS CAPSULE STUDY: SHX5432

## 2020-10-11 SURGERY — IMAGING PROCEDURE, GI TRACT, INTRALUMINAL, VIA CAPSULE

## 2020-10-13 ENCOUNTER — Encounter (HOSPITAL_COMMUNITY): Payer: Self-pay | Admitting: Gastroenterology

## 2020-10-14 NOTE — Procedures (Signed)
Small Bowel [place] Capsule Study Procedure date:  10/11/2020  Referring Provider:  PCP PCP:  Dr. Loman Brooklyn, FNP  Indication for procedure:  Iron deficiency anemia  Findings: Capsule endoscopy reach the cecum.  Preparation was adequate.  There was presence of a nonbleeding AVM in the first part of the small bowel, likely in the 3/4 portion of the duodenum (01:13:07).  This lesion was not bleeding.  There was no presence of any other lesions in the small bowel lining.  No stigmata of recent bleeding.  First Gastric image:  00:04:00 First Duodenal image: 01:12:40 First Cecal image: 04:51:49 Gastric Passage time: 01h 58m Small Bowel Passage time:  03h 20m  Summary & Recommendations: There was presence of one nonbleeding AVM in the duodenum.   I discussed with the patient the possibility of ablating these endoscopically, he agreed to proceed with this.  No other lesions were found throughout the rest of the gastrointestinal tract.  - Schedule repeat push enteroscopy - Continue oral iron supplementation BID  I personally communicated these recommendations to the patient  Maylon Peppers, MD Gastroenterology and Hepatology [facility]

## 2020-10-18 ENCOUNTER — Encounter (INDEPENDENT_AMBULATORY_CARE_PROVIDER_SITE_OTHER): Payer: Self-pay

## 2020-10-18 ENCOUNTER — Other Ambulatory Visit (INDEPENDENT_AMBULATORY_CARE_PROVIDER_SITE_OTHER): Payer: Self-pay

## 2020-10-19 NOTE — Patient Instructions (Signed)
Theodore Mendoza  10/19/2020     @PREFPERIOPPHARMACY @   Your procedure is scheduled on  10/25/2020.   Report to [facility] at  1130  A.M.   Call this number if you have problems the morning of surgery:  [phone]   Remember:  Follow the diet instructions given to you by the office.    Take these medicines the morning of surgery with A SIP OF WATER        amlodipine, tikosyn, lexapro, gabapentin, protonix, flomax.     Do not wear jewelry, make-up or nail polish.  Do not wear lotions, powders, or perfumes, or deodorant.  Do not shave 48 hours prior to surgery.  Men may shave face and neck.  Do not bring valuables to the hospital.  [facility] is not responsible for any belongings or valuables.  Contacts, dentures or bridgework may not be worn into surgery.  Leave your suitcase in the car.  After surgery it may be brought to your room.  For patients admitted to the hospital, discharge time will be determined by your treatment team.  Patients discharged the day of surgery will not be allowed to drive home and must have someone with them for 24 hours.    Special instructions:    DO NOT smoke tobacco or vape for 24 hours before your procedure.  Please read over the following fact sheets that you were given. Anesthesia Post-op Instructions and Care and Recovery After Surgery      Upper Endoscopy, Adult, Care After This sheet gives you information about how to care for yourself after your procedure. Your health care provider may also give you more specific instructions. If you have problems or questions, contact your health careprovider. What can I expect after the procedure? After the procedure, it is common to have: A sore throat. Mild stomach pain or discomfort. Bloating. Nausea. Follow these instructions at home:  Follow instructions from your health care provider about what to eat or drink after your procedure. Return to your normal activities as told by your  health care provider. Ask your health care provider what activities are safe for you. Take over-the-counter and prescription medicines only as told by your health care provider. If you were given a sedative during the procedure, it can affect you for several hours. Do not drive or operate machinery until your health care provider says that it is safe. Keep all follow-up visits as told by your health care provider. This is important. Contact a health care provider if you have: A sore throat that lasts longer than one day. Trouble swallowing. Get help right away if: You vomit blood or your vomit looks like coffee grounds. You have: A fever. Bloody, black, or tarry stools. A severe sore throat or you cannot swallow. Difficulty breathing. Severe pain in your chest or abdomen. Summary After the procedure, it is common to have a sore throat, mild stomach discomfort, bloating, and nausea. If you were given a sedative during the procedure, it can affect you for several hours. Do not drive or operate machinery until your health care provider says that it is safe. Follow instructions from your health care provider about what to eat or drink after your procedure. Return to your normal activities as told by your health care provider. This information is not intended to replace advice given to you by your health care provider. Make sure you discuss any questions you have with your healthcare provider. Document  Revised: 03/17/2019 Document Reviewed: 08/19/2017 Elsevier Patient Education  2022 [place] After This sheet gives you information about how to care for yourself after your procedure. Your health care provider may also give you more specific instructions. If you have problems or questions, contact your health careprovider. What can I expect after the procedure? After the procedure, it is common to have: Tiredness. Forgetfulness about what happened after the  procedure. Impaired judgment for important decisions. Nausea or vomiting. Some difficulty with balance. Follow these instructions at home: For the time period you were told by your health care provider:     Rest as needed. Do not participate in activities where you could fall or become injured. Do not drive or use machinery. Do not drink alcohol. Do not take sleeping pills or medicines that cause drowsiness. Do not make important decisions or sign legal documents. Do not take care of children on your own. Eating and drinking Follow the diet that is recommended by your health care provider. Drink enough fluid to keep your urine pale yellow. If you vomit: Drink water, juice, or soup when you can drink without vomiting. Make sure you have little or no nausea before eating solid foods. General instructions Have a responsible adult stay with you for the time you are told. It is important to have someone help care for you until you are awake and alert. Take over-the-counter and prescription medicines only as told by your health care provider. If you have sleep apnea, surgery and certain medicines can increase your risk for breathing problems. Follow instructions from your health care provider about wearing your sleep device: Anytime you are sleeping, including during daytime naps. While taking prescription pain medicines, sleeping medicines, or medicines that make you drowsy. Avoid smoking. Keep all follow-up visits as told by your health care provider. This is important. Contact a health care provider if: You keep feeling nauseous or you keep vomiting. You feel light-headed. You are still sleepy or having trouble with balance after 24 hours. You develop a rash. You have a fever. You have redness or swelling around the IV site. Get help right away if: You have trouble breathing. You have new-onset confusion at home. Summary For several hours after your procedure, you may feel  tired. You may also be forgetful and have poor judgment. Have a responsible adult stay with you for the time you are told. It is important to have someone help care for you until you are awake and alert. Rest as told. Do not drive or operate machinery. Do not drink alcohol or take sleeping pills. Get help right away if you have trouble breathing, or if you suddenly become confused. This information is not intended to replace advice given to you by your health care provider. Make sure you discuss any questions you have with your healthcare provider. Document Revised: 12/03/2019 Document Reviewed: 02/19/2019 Elsevier Patient Education  2022 [employer].

## 2020-10-20 ENCOUNTER — Other Ambulatory Visit: Payer: Self-pay | Admitting: Family Medicine

## 2020-10-20 DIAGNOSIS — I10 Essential (primary) hypertension: Secondary | ICD-10-CM

## 2020-10-21 ENCOUNTER — Other Ambulatory Visit: Payer: Self-pay

## 2020-10-21 ENCOUNTER — Encounter (HOSPITAL_COMMUNITY)
Admission: RE | Admit: 2020-10-21 | Discharge: 2020-10-21 | Disposition: A | Discharge status: Home / Self Care | Payer: Medicare Other | Source: Ambulatory Visit | Attending: Gastroenterology | Admitting: Gastroenterology

## 2020-10-21 DIAGNOSIS — Z01812 Encounter for preprocedural laboratory examination: Secondary | ICD-10-CM | POA: Insufficient documentation

## 2020-10-21 LAB — BASIC METABOLIC PANEL
Anion gap: 5 (ref 5–15)
BUN: 16 mg/dL (ref 8–23)
CO2: 32 mmol/L (ref 22–32)
Calcium: 9.1 mg/dL (ref 8.9–10.3)
Chloride: 106 mmol/L (ref 98–111)
Creatinine, Ser: 0.77 mg/dL (ref 0.61–1.24)
GFR, Estimated: >60 mL/min (ref >60)
Glucose, Bld: 107 mg/dL — ABNORMAL HIGH (ref 70–99)
Potassium: 4 mmol/L (ref 3.5–5.1)
Sodium: 143 mmol/L (ref 135–145)

## 2020-10-21 LAB — CBC WITH DIFFERENTIAL/PLATELET
Abs Immature Granulocytes: 0.01 10*3/uL (ref 0.00–0.07)
Basophils Absolute: 0.1 10*3/uL (ref 0.0–0.1)
Basophils Relative: 1 %
Eosinophils Absolute: 0.3 10*3/uL (ref 0.0–0.5)
Eosinophils Relative: 4 %
HCT: 35.5 % — ABNORMAL LOW (ref 39.0–52.0)
Hemoglobin: 11.2 g/dL — ABNORMAL LOW (ref 13.0–17.0)
Immature Granulocytes: 0 %
Lymphocytes Relative: 20 %
Lymphs Abs: 1.4 10*3/uL (ref 0.7–4.0)
MCH: 28.8 pg (ref 26.0–34.0)
MCHC: 31.5 g/dL (ref 30.0–36.0)
MCV: 91.3 fL (ref 80.0–100.0)
Monocytes Absolute: 0.3 10*3/uL (ref 0.1–1.0)
Monocytes Relative: 4 %
Neutro Abs: 4.8 10*3/uL (ref 1.7–7.7)
Neutrophils Relative %: 71 %
Platelets: 169 10*3/uL (ref 150–400)
RBC: 3.89 MIL/uL — ABNORMAL LOW (ref 4.22–5.81)
RDW: 14.1 % (ref 11.5–15.5)
WBC: 6.8 10*3/uL (ref 4.0–10.5)
nRBC: 0 % (ref 0.0–0.2)

## 2020-10-25 ENCOUNTER — Encounter (HOSPITAL_COMMUNITY)
Admission: RE | Disposition: A | Discharge status: Home / Self Care | Payer: Self-pay | Source: Home / Self Care | Attending: Gastroenterology

## 2020-10-25 ENCOUNTER — Other Ambulatory Visit: Payer: Self-pay

## 2020-10-25 ENCOUNTER — Ambulatory Visit (HOSPITAL_COMMUNITY): Payer: Medicare Other | Admitting: Anesthesiology

## 2020-10-25 ENCOUNTER — Ambulatory Visit (HOSPITAL_COMMUNITY)
Admission: RE | Admit: 2020-10-25 | Discharge: 2020-10-25 | Disposition: A | Discharge status: Home / Self Care | Payer: Medicare Other | Attending: Gastroenterology | Admitting: Gastroenterology

## 2020-10-25 ENCOUNTER — Encounter (HOSPITAL_COMMUNITY): Payer: Self-pay | Admitting: Gastroenterology

## 2020-10-25 DIAGNOSIS — Z7982 Long term (current) use of aspirin: Secondary | ICD-10-CM | POA: Diagnosis not present

## 2020-10-25 DIAGNOSIS — Z885 Allergy status to narcotic agent status: Secondary | ICD-10-CM | POA: Diagnosis not present

## 2020-10-25 DIAGNOSIS — I509 Heart failure, unspecified: Secondary | ICD-10-CM | POA: Insufficient documentation

## 2020-10-25 DIAGNOSIS — Z888 Allergy status to other drugs, medicaments and biological substances status: Secondary | ICD-10-CM | POA: Insufficient documentation

## 2020-10-25 DIAGNOSIS — K921 Melena: Secondary | ICD-10-CM | POA: Diagnosis not present

## 2020-10-25 DIAGNOSIS — I4891 Unspecified atrial fibrillation: Secondary | ICD-10-CM | POA: Diagnosis not present

## 2020-10-25 DIAGNOSIS — Z7901 Long term (current) use of anticoagulants: Secondary | ICD-10-CM | POA: Diagnosis not present

## 2020-10-25 DIAGNOSIS — Z7902 Long term (current) use of antithrombotics/antiplatelets: Secondary | ICD-10-CM | POA: Insufficient documentation

## 2020-10-25 DIAGNOSIS — K5520 Angiodysplasia of colon without hemorrhage: Secondary | ICD-10-CM | POA: Diagnosis not present

## 2020-10-25 DIAGNOSIS — I110 Hypertensive heart disease with heart failure: Secondary | ICD-10-CM | POA: Diagnosis not present

## 2020-10-25 DIAGNOSIS — Z79899 Other long term (current) drug therapy: Secondary | ICD-10-CM | POA: Insufficient documentation

## 2020-10-25 DIAGNOSIS — Z955 Presence of coronary angioplasty implant and graft: Secondary | ICD-10-CM | POA: Diagnosis not present

## 2020-10-25 HISTORY — PX: ENTEROSCOPY: SHX5533

## 2020-10-25 HISTORY — PX: ESOPHAGOGASTRODUODENOSCOPY (EGD) WITH PROPOFOL: SHX5813

## 2020-10-25 SURGERY — ESOPHAGOGASTRODUODENOSCOPY (EGD) WITH PROPOFOL
Anesthesia: General

## 2020-10-25 MED ORDER — PROPOFOL 10 MG/ML IV BOLUS
INTRAVENOUS | Status: DC | PRN
  Administered 2020-10-25: 100 mg via INTRAVENOUS
  Administered 2020-10-25: 30 mg via INTRAVENOUS

## 2020-10-25 MED ORDER — PROPOFOL 500 MG/50ML IV EMUL
INTRAVENOUS | Status: DC | PRN
  Administered 2020-10-25: 100 ug/kg/min via INTRAVENOUS

## 2020-10-25 MED ORDER — GLUCAGON HCL RDNA (DIAGNOSTIC) 1 MG IJ SOLR
INTRAMUSCULAR | Status: DC | PRN
Start: 2020-10-25 — End: 2020-10-25
  Administered 2020-10-25: .25 mg via INTRAVENOUS

## 2020-10-25 MED ORDER — GLUCAGON HCL RDNA (DIAGNOSTIC) 1 MG IJ SOLR
INTRAMUSCULAR | Status: AC
  Filled 2020-10-25: qty 1

## 2020-10-25 MED ORDER — LACTATED RINGERS IV SOLN: INTRAVENOUS | Status: DC

## 2020-10-25 MED ORDER — LIDOCAINE HCL (CARDIAC) PF 100 MG/5ML IV SOSY
PREFILLED_SYRINGE | INTRAVENOUS | Status: DC | PRN
  Administered 2020-10-25: 50 mg via INTRAVENOUS

## 2020-10-25 NOTE — Anesthesia Postprocedure Evaluation (Signed)
Anesthesia Post Note  Patient: Theodore Mendoza  Procedure(s) Performed: ESOPHAGOGASTRODUODENOSCOPY (EGD) WITH PROPOFOL PUSH ENTEROSCOPY  Patient location during evaluation: Phase II Anesthesia Type: General Level of consciousness: awake Pain management: pain level controlled Vital Signs Assessment: post-procedure vital signs reviewed and stable Respiratory status: spontaneous breathing and respiratory function stable Cardiovascular status: blood pressure returned to baseline and stable Postop Assessment: no headache and no apparent nausea or vomiting Anesthetic complications: no Comments: Late entry   No notable events documented.   Last Vitals:  Vitals:   10/25/20 1409 10/25/20 1412  BP:    Pulse: 88   Resp: 18   Temp:  36.7 C  SpO2: 100%     Last Pain:  Vitals:   10/25/20 1412  TempSrc: Axillary  PainSc:                  [name]

## 2020-10-25 NOTE — Anesthesia Procedure Notes (Signed)
Date/Time: 10/25/2020 1:42 PM Performed by: Orlie Dakin, CRNA Pre-anesthesia Checklist: Patient identified, Emergency Drugs available, Suction available and Patient being monitored Patient Re-evaluated:Patient Re-evaluated prior to induction Oxygen Delivery Method: Nasal cannula Induction Type: IV induction Placement Confirmation: positive ETCO2

## 2020-10-25 NOTE — Discharge Instructions (Addendum)
You are being discharged to home.  Resume your previous diet.  Continue oral iron intake. Restart Eliquis and Plavix today.

## 2020-10-25 NOTE — Anesthesia Preprocedure Evaluation (Signed)
Anesthesia Evaluation  Patient identified by MRN, date of birth, ID band Patient awake    Reviewed: Allergy & Precautions, NPO status , Patient's Chart, lab work & pertinent test results  Airway Mallampati: II  TM Distance: >3 FB Neck ROM: Full   Comment: H/o tracheostomy  Dental  (+) Dental Advisory Given, Missing   Pulmonary  Tracheostomy after GBS  H/o tracheostomy Pulmonary exam normal breath sounds clear to auscultation       Cardiovascular Exercise Tolerance: Poor METS (Paraplegic): hypertension, Pt. on medications + Past MI and +CHF  Normal cardiovascular exam+ dysrhythmias Atrial Fibrillation  Rhythm:Regular Rate:Normal  11-Aug-2020 08:10:34 [facility] ROUTINE RECORD [id number] (34 yr) Male Caucasian Room:ER2 Loc:499 Technician: VSP Test ind: Comment 1:: Comment 2:: Comment 3:: Comment 4:: Vent. rate 84 BPM PR interval 173 ms QRS duration 99 ms QT/QTcB 387/458 ms P-R-T axes 74 18 52 Sinus rhythm No significant change since last tracing Confirmed by Lacretia Leigh ([id number]) on 08/12/2020 10:50:07 AM   Neuro/Psych TIA Neuromuscular disease (Gullian Barre Syndrome, incomplete paraplegia) negative psych ROS   GI/Hepatic negative GI ROS, Neg liver ROS,   Endo/Other  negative endocrine ROS  Renal/GU Renal disease     Musculoskeletal negative musculoskeletal ROS (+)   Abdominal   Peds  Hematology  (+) anemia ,   Anesthesia Other Findings   Reproductive/Obstetrics negative OB ROS                             Anesthesia Physical  Anesthesia Plan  ASA: IV  Anesthesia Plan: General   Post-op Pain Management:    Induction: Intravenous  PONV Risk Score and Plan: Propofol infusion  Airway Management Planned: Nasal Cannula and Natural Airway  Additional Equipment:   Intra-op Plan:   Post-operative Plan:   Informed Consent: I have reviewed the  patients History and Physical, chart, labs and discussed the procedure including the risks, benefits and alternatives for the proposed anesthesia with the patient or authorized representative who has indicated his/her understanding and acceptance.     Dental advisory given  Plan Discussed with: Surgeon  Anesthesia Plan Comments:         Anesthesia Quick Evaluation

## 2020-10-25 NOTE — Transfer of Care (Signed)
Immediate Anesthesia Transfer of Care Note  Patient: Theodore Mendoza  Procedure(s) Performed: ESOPHAGOGASTRODUODENOSCOPY (EGD) WITH PROPOFOL PUSH ENTEROSCOPY  Patient Location: Short Stay  Anesthesia Type:General  Level of Consciousness: drowsy  Airway & Oxygen Therapy: Patient Spontanous Breathing and Patient connected to nasal cannula oxygen  Post-op Assessment: Report given to RN and Post -op Vital signs reviewed and stable  Post vital signs: Reviewed and stable  Last Vitals:  Vitals Value Taken Time  BP    Temp    Pulse    Resp    SpO2      Last Pain:  Vitals:   10/25/20 1340  TempSrc:   PainSc: 0-No pain      Patients Stated Pain Goal: 6 ([id number])  Complications: No notable events documented.

## 2020-10-25 NOTE — H&P (Signed)
Theodore Mendoza is an 80 y.o. male.   Chief Complaint: small bowel AVM HPI: Theodore Mendoza is a 80 y.o. male with PMH brain tumor, afib on Eliquis, Guillian Barr and heart failure, who presents for management of small bowel AVMs.  The patient had an episode of melena and drop in his hemoglobin in the past which has been thoroughly evaluated with EGD, colonoscopy and push enteroscopy in the past.  Unfortunately, no source was found.  Due to this he underwent a capsule endoscopy he was found to have an AVM that was not bleeding in the duodenum.  He denies having any melena, hematochezia, nausea, vomiting, fever, chills.  He is taking Plavix and aspirin.  Past Medical History:  Diagnosis Date   AAA (abdominal aortic aneurysm) ([place])    Brain tumor ([place])    x2   Cardiac arrhythmia    CHF (congestive heart failure) ([place])    Guillain-Barre syndrome ([place]) Feb 13 1986   Hypertension    Kidney stones    Myocardial infarction ([place]) 2007    Past Surgical History:  Procedure Laterality Date   COLONOSCOPY WITH PROPOFOL N/A 08/14/2020   Procedure: COLONOSCOPY WITH PROPOFOL;  Surgeon: Rogene Houston, MD;  [facility];  Service: Endoscopy;  Laterality: N/A;   CORONARY ANGIOPLASTY  1997   after mi   CYSTOSCOPY WITH RETROGRADE PYELOGRAM, URETEROSCOPY AND STENT PLACEMENT Left 06/28/2014   Procedure: 1ST STAGE CYSTOSCOPY/URETEROSCOPY/STENT PLACEMENT;  Surgeon: Alexis Frock, MD;  Location: [facility];  Service: Urology;  Laterality: Left;   CYSTOSCOPY WITH STENT PLACEMENT Left 05/08/2014   Procedure: CYSTOSCOPY, RETROGRADE PYELOGRAM WITH LEFT URETERAL STENT PLACEMENT;  Surgeon: Alexis Frock, MD;  Location: [facility];  Service: Urology;  Laterality: Left;   ENTEROSCOPY N/A 09/16/2020   Procedure: PUSH ENTEROSCOPY;  Surgeon: Harvel Quale, MD;  [facility];  Service: Gastroenterology;  Laterality: N/A;   ESOPHAGOGASTRODUODENOSCOPY (EGD) WITH PROPOFOL N/A 12/16/2019   Procedure:  ESOPHAGOGASTRODUODENOSCOPY (EGD) WITH PROPOFOL;  Surgeon: Mauri Pole, MD;  [facility];  Service: Endoscopy;  Laterality: N/A;   ESOPHAGOGASTRODUODENOSCOPY (EGD) WITH PROPOFOL N/A 08/13/2020   Procedure: ESOPHAGOGASTRODUODENOSCOPY (EGD) WITH PROPOFOL;  Surgeon: Rogene Houston, MD;  [facility];  Service: Endoscopy;  Laterality: N/A;   ESOPHAGOGASTRODUODENOSCOPY (EGD) WITH PROPOFOL N/A 09/16/2020   Procedure: ESOPHAGOGASTRODUODENOSCOPY (EGD) WITH PROPOFOL;  Surgeon: Harvel Quale, MD;  [facility];  Service: Gastroenterology;  Laterality: N/A;  1:55   EYE SURGERY Right may 2015   growth removed, july 2015left eye cataract removed, right eye catarct removed   gamma kniferadiation treatment  Feb 09 2014   [facility]   GIVENS CAPSULE STUDY N/A 10/11/2020   Procedure: GIVENS CAPSULE STUDY;  Surgeon: Harvel Quale, MD;  [facility];  Service: Gastroenterology;  Laterality: N/A;  7:30   HEMOSTASIS CLIP PLACEMENT  12/16/2019   Procedure: HEMOSTASIS CLIP PLACEMENT;  Surgeon: Mauri Pole, MD;  [facility];  Service: Endoscopy;;   HEMOSTASIS CONTROL  12/16/2019   Procedure: HEMOSTASIS CONTROL;  Surgeon: Mauri Pole, MD;  [facility];  Service: Endoscopy;;  Endoloop   HOLMIUM LASER APPLICATION Left [id number]   Procedure: HOLMIUM LASER APPLICATION;  Surgeon: Alexis Frock, MD;  [facility];  Service: Urology;  Laterality: Left;   LITHOTRIPSY  years ago   POLYPECTOMY  12/16/2019   Procedure: POLYPECTOMY;  Surgeon: Mauri Pole, MD;  [facility];  Service: Endoscopy;;  STONE EXTRACTION WITH BASKET Left 06/28/2014   Procedure: STONE EXTRACTION WITH BASKET;  Surgeon: Alexis Frock, MD;  [facility];  Service: Urology;  Laterality: Left;    Family History  Problem Relation Age of Onset   Diabetes Mother    Lung cancer Mother    CAD Father    Diabetes  Brother    Diabetes Sister    Stroke Sister    Other Maternal Grandfather        brain tumor   Colon cancer Neg Hx    Pancreatic cancer Neg Hx    Esophageal cancer Neg Hx    Social History:  reports that he has never smoked. He has never used smokeless tobacco. He reports that he does not drink alcohol and does not use drugs.  Allergies:  Allergies  Allergen Reactions   Hydrocodone-Acetaminophen     Passed out and got real weak   Morphine And Related Other (See Comments)    Reaction:  Confusion/weakness/hallucinations   Valsartan Rash    Medications Prior to Admission  Medication Sig Dispense Refill   amLODipine (NORVASC) 5 MG tablet TAKE 1 TABLET BY MOUTH DAILY. (Patient taking differently: Take 5 mg by mouth daily.) 90 tablet 0   apixaban (ELIQUIS) 5 MG TABS tablet Take 1 tablet (5 mg total) by mouth 2 (two) times daily. 180 tablet 3   clopidogrel (PLAVIX) 75 MG tablet TAKE 1 TABLET ONCE DAILY (Patient taking differently: Take 75 mg by mouth daily.) 90 tablet 1   dofetilide (TIKOSYN) 500 MCG capsule Take 1 capsule (500 mcg total) by mouth 2 (two) times daily. 180 capsule 3   escitalopram (LEXAPRO) 20 MG tablet Take 1 tablet (20 mg total) by mouth daily. 30 tablet 5   Ferrous Sulfate (IRON) 325 (65 Fe) MG TABS Take 1 tablet (325 mg total) by mouth daily. (Patient taking differently: Take 325 mg by mouth in the morning and at bedtime.) 180 tablet 2   furosemide (LASIX) 20 MG tablet Take 1 tablet (20 mg total) by mouth daily. 90 tablet 1   gabapentin (NEURONTIN) 300 MG capsule Take 1 capsule (300 mg total) by mouth 2 (two) times daily AND 2 capsules (600 mg total) at bedtime. (Patient taking differently: Take 300 mg by mouth three times daily) 360 capsule 1   lisinopril (ZESTRIL) 5 MG tablet TAKE 1 TABLET ONCE DAILY (Patient taking differently: Take 5 mg by mouth daily.) 90 tablet 1   pantoprazole (PROTONIX) 40 MG tablet Take 1 tablet (40 mg total) by mouth daily. (Patient taking  differently: Take 40 mg by mouth 2 (two) times daily.) 90 tablet 3   pravastatin (PRAVACHOL) 40 MG tablet Take 1 tablet (40 mg total) by mouth daily. 90 tablet 1   sucralfate (CARAFATE) 1 g tablet Take 1 tablet (1 g total) by mouth 2 (two) times daily. 180 tablet 1   tamsulosin (FLOMAX) 0.4 MG CAPS capsule Take 1 capsule (0.4 mg total) by mouth daily. 90 capsule 1   vitamin B-12 (CYANOCOBALAMIN) 1000 MCG tablet Take 1 tablet (1,000 mcg total) by mouth daily. 30 tablet 2    No results found for this or any previous visit (from the past 48 hour(s)). No results found.  Review of Systems  Constitutional: Negative.   HENT: Negative.    Eyes: Negative.   Respiratory: Negative.    Cardiovascular: Negative.   Gastrointestinal: Negative.   Endocrine: Negative.   Genitourinary: Negative.   Musculoskeletal: Negative.   Skin: Negative.   Allergic/Immunologic:  Negative.   Neurological: Negative.   Hematological: Negative.   Psychiatric/Behavioral: Negative.     Blood pressure (!) 141/66, pulse (!) 58, temperature 98.3 F (36.8 C), temperature source Oral, resp. rate 20, SpO2 100 %. Physical Exam  GENERAL: The patient is AO x3, in no acute distress. HEENT: Head is normocephalic and atraumatic. EOMI are intact. Mouth is well hydrated and without lesions. NECK: Supple. No masses LUNGS: Clear to auscultation. No presence of rhonchi/wheezing/rales. Adequate chest expansion HEART: RRR, normal s1 and s2. ABDOMEN: Soft, nontender, no guarding, no peritoneal signs, and nondistended. BS +. No masses. EXTREMITIES: Without any cyanosis, clubbing, rash, lesions or edema. NEUROLOGIC: AOx3, no focal motor deficit. SKIN: no jaundice, no rashes  Assessment/Plan Theodore Mendoza is a 80 y.o. male with PMH brain tumor, afib on Eliquis, Guillian Barr and heart failure, who presents for management of small bowel AVMs. Will proceed with push enterocopy  Harvel Quale, MD 10/25/2020, 12:53 PM

## 2020-10-25 NOTE — Op Note (Signed)
[facility] Patient Name: Theodore Mendoza Procedure Date: 10/25/2020 1:22 PM MRN: [id number] Date of Birth: May 25, 1940 Attending MD: Maylon Peppers ,  CSN: [id number] Age: 80 Admit Type: Outpatient Procedure:                Small bowel enteroscopy Indications:              Angioectasia Providers:                Maylon Peppers, Janeece Riggers, RN, Randa Spike,                            Technician Referring MD:              Medicines:                Monitored Anesthesia Care Complications:            No immediate complications. Estimated Blood Loss:     Estimated blood loss: none. Procedure:                Pre-Anesthesia Assessment:                           - Prior to the procedure, a History and Physical                            was performed, and patient medications, allergies                            and sensitivities were reviewed. The patient's                            tolerance of previous anesthesia was reviewed.                           - The risks and benefits of the procedure and the                            sedation options and risks were discussed with the                            patient. All questions were answered and informed                            consent was obtained.                           - ASA Grade Assessment: III - A patient with severe                            systemic disease.                           After obtaining informed consent, the endoscope was                            passed under direct vision. Throughout the  procedure, the patient's blood pressure, pulse, and                            oxygen saturations were monitored continuously. The                            [id number]) scope was introduced through                            the mouth and advanced to the proximal jejunum. The                            small bowel enteroscopy was accomplished without                             difficulty. The patient tolerated the procedure                            well. Scope In: 1:40:23 PM Scope Out: 1:56:28 PM Total Procedure Duration: 0 hours 16 minutes 5 seconds  Findings:      The esophagus was normal.      The stomach was normal.      There was no evidence of significant pathology in the entire examined       duodenum. No AVMs were observed upo careful inspection.      There was no evidence of significant pathology in the proximal jejunum.       No AVMs were observed upo careful inspection. Impression:               - Normal esophagus.                           - Normal stomach.                           - Normal examined duodenum.                           - The examined portion of the jejunum was normal.                           - No specimens collected. Moderate Sedation:      Per Anesthesia Care Recommendation:           - Discharge patient to home (ambulatory).                           - Resume previous diet.                           - Continue oral iron intake. Procedure Code(s):        --- Professional ---                           [phone], Small intestinal endoscopy, enteroscopy                            beyond  second portion of duodenum, not including                            ileum; diagnostic, including collection of                            specimen(s) by brushing or washing, when performed                            (separate procedure) Diagnosis Code(s):        --- Professional ---                           [id number], Angiodysplasia of colon without hemorrhage CPT copyright 2019 [employer]. All rights reserved. The codes documented in this report are preliminary and upon coder review may  be revised to meet current compliance requirements. Maylon Peppers, MD Maylon Peppers,  10/25/2020 2:04:02 PM This report has been signed electronically. Number of Addenda: 0

## 2020-10-28 ENCOUNTER — Encounter (HOSPITAL_COMMUNITY): Payer: Self-pay | Admitting: Gastroenterology

## 2020-11-01 ENCOUNTER — Encounter: Payer: Self-pay | Admitting: Family Medicine

## 2020-11-01 ENCOUNTER — Other Ambulatory Visit: Payer: Self-pay

## 2020-11-01 ENCOUNTER — Ambulatory Visit (INDEPENDENT_AMBULATORY_CARE_PROVIDER_SITE_OTHER): Payer: Medicare Other | Admitting: Family Medicine

## 2020-11-01 VITALS — BP 150/69 | HR 59 | Temp 97.6°F

## 2020-11-01 DIAGNOSIS — F411 Generalized anxiety disorder: Secondary | ICD-10-CM | POA: Insufficient documentation

## 2020-11-01 DIAGNOSIS — I10 Essential (primary) hypertension: Secondary | ICD-10-CM | POA: Diagnosis not present

## 2020-11-01 DIAGNOSIS — F332 Major depressive disorder, recurrent severe without psychotic features: Secondary | ICD-10-CM | POA: Diagnosis not present

## 2020-11-01 DIAGNOSIS — D500 Iron deficiency anemia secondary to blood loss (chronic): Secondary | ICD-10-CM | POA: Diagnosis not present

## 2020-11-01 MED ORDER — AMLODIPINE BESYLATE 5 MG PO TABS: 5.0000 mg | ORAL_TABLET | Freq: Every day | ORAL | 1 refills | Status: DC

## 2020-11-01 NOTE — Progress Notes (Addendum)
Assessment & Plan:  1. Iron deficiency anemia due to chronic blood loss Continue iron supplement. I have called patient's cardiologist and left a message for permission to decrease Eliquis dose from 5 mg to 2.5 mg.  2. Essential (primary) hypertension Uncontrolled. Amlodipine added back to patient's regimen. Patient to continue to monitor his BP at home, keep a log, and bring it back with him to his next appointment.  - amLODipine (NORVASC) 5 MG tablet; Take 1 tablet (5 mg total) by mouth daily.  Dispense: 90 tablet; Refill: 1  3. Severe episode of recurrent major depressive disorder, without psychotic features ([place]) Well controlled on current regimen.   4. Generalized anxiety disorder Well controlled on current regimen.    Addendum: I have spoken with Helene Kelp at the cardiology office. An order was provided that the patient can temporarily decrease Eliquis from 5 mg to 2.5 mg twice daily if he is actively bleeding. Long term management of his Eliquis and Plavix will be discussed with him at his appointment at the end of the month now that they are aware he has had multiple GI bleeds over the past year. Patient aware.   Return in about 3 months (around 02/01/2021) for annual physical.  [name], MSN, APRN, FNP-C [facility]  Subjective:    Patient ID: Theodore Mendoza, male    DOB: [birth year], 80 y.o.   MRN: [id number]  Patient Care Team: Loman Brooklyn, FNP as PCP - General (Family Medicine) Loretta Plume as Consulting Physician (Neurosurgery) Alexis Frock, MD as Consulting Physician (Urology) Dudley Major, MD as Consulting Physician (Cardiology) Lavera Guise, [facility] (Pharmacist)   Chief Complaint:  Chief Complaint  Patient presents with   Depression    2 month follow up- patient states it has improved     Hospitalization Follow-up    6/3 Anemia [facility]    HPI: Theodore Mendoza is a 80 y.o. male presenting on 11/01/2020 for Depression (2 month  follow up- patient states it has improved /) and Hospitalization Follow-up (6/3 Anemia [facility])  After patient's last appointment he was sent to the hospital due to symptomatic anemia. He received a blood transfusion. He has had a push enteroscopy, a capsule endoscopy, and a small bowel enteroscopy which were normal. Gastroenterology feels Plavix and Eliquis are making his episodes of gastrointestinal bleeding (that are self-limiting) more frequent and suggested discussing a decrease in Eliquis with cardiology. He is taking oral iron and a stool softener. Patient is feeling much better today. His amlodipine was stopped at his last visit due to his soft blood pressure readings with his anemia, but this has resolved. His blood pressure readings at home over the past 10 days were 154-171/80-98.   Patient is also following up on depression and anxiety. At our last appointment Lexapro was increased from 10 mg to 20 mg, which he reports has worked well for him. He does not feel he needs further medication changes.  Depression screen [facility] 2/9 11/01/2020 09/01/2020 08/23/2020  Decreased Interest 0 3 1  Down, Depressed, Hopeless 1 2 0  PHQ - 2 Score _0 Altered sleeping _1 Tired, decreased energy _2 Change in appetite 0 2 2  Feeling bad or failure about yourself  0 2 0  Trouble concentrating 0 2 0  Moving slowly or fidgety/restless 1 2 0  Suicidal thoughts 0 2 0  PHQ-9 Score _3 Difficult doing work/chores Somewhat  difficult Extremely dIfficult Not difficult at all  Some recent data might be hidden   GAD 7 : Generalized Anxiety Score 11/01/2020 09/01/2020 08/23/2020 03/11/2020  Nervous, Anxious, on Edge _0 Control/stop worrying _1 0  Worry too much - different things 0 _2 Trouble relaxing _3 0  Restless _4 0  Easily annoyed or irritable _5 0  Afraid - awful might happen _6 Total GAD 7 Score _7 Anxiety Difficulty Somewhat difficult Somewhat difficult Somewhat  difficult Not difficult at all    New complaints: None   Social history:  Relevant past medical, surgical, family and social history reviewed and updated as indicated. Interim medical history since our last visit reviewed.  Allergies and medications reviewed and updated.  DATA REVIEWED: CHART IN EPIC  ROS: Negative unless specifically indicated above in HPI.    Current Outpatient Medications:    amLODipine (NORVASC) 5 MG tablet, TAKE 1 TABLET BY MOUTH DAILY. (Patient taking differently: Take 5 mg by mouth daily.), Disp: 90 tablet, Rfl: 0   apixaban (ELIQUIS) 5 MG TABS tablet, Take 1 tablet (5 mg total) by mouth 2 (two) times daily., Disp: 180 tablet, Rfl: 3   clopidogrel (PLAVIX) 75 MG tablet, TAKE 1 TABLET ONCE DAILY (Patient taking differently: Take 75 mg by mouth daily.), Disp: 90 tablet, Rfl: 1   dofetilide (TIKOSYN) 500 MCG capsule, Take 1 capsule (500 mcg total) by mouth 2 (two) times daily., Disp: 180 capsule, Rfl: 3   escitalopram (LEXAPRO) 20 MG tablet, Take 1 tablet (20 mg total) by mouth daily., Disp: 30 tablet, Rfl: 5   Ferrous Sulfate (IRON) 325 (65 Fe) MG TABS, Take 1 tablet (325 mg total) by mouth daily., Disp: 180 tablet, Rfl: 2   furosemide (LASIX) 20 MG tablet, Take 1 tablet (20 mg total) by mouth daily., Disp: 90 tablet, Rfl: 1   gabapentin (NEURONTIN) 300 MG capsule, Take 1 capsule (300 mg total) by mouth 2 (two) times daily AND 2 capsules (600 mg total) at bedtime. (Patient taking differently: Take 300 mg by mouth three times daily), Disp: 360 capsule, Rfl: 1   lisinopril (ZESTRIL) 5 MG tablet, TAKE 1 TABLET ONCE DAILY (Patient taking differently: Take 5 mg by mouth daily.), Disp: 90 tablet, Rfl: 1   pantoprazole (PROTONIX) 40 MG tablet, Take 1 tablet (40 mg total) by mouth daily. (Patient taking differently: Take 40 mg by mouth 2 (two) times daily.), Disp: 90 tablet, Rfl: 3   pravastatin (PRAVACHOL) 40 MG tablet, Take 1 tablet (40 mg total) by mouth daily., Disp: 90  tablet, Rfl: 1   sucralfate (CARAFATE) 1 g tablet, Take 1 tablet (1 g total) by mouth 2 (two) times daily., Disp: 180 tablet, Rfl: 1   tamsulosin (FLOMAX) 0.4 MG CAPS capsule, Take 1 capsule (0.4 mg total) by mouth daily., Disp: 90 capsule, Rfl: 1   vitamin B-12 (CYANOCOBALAMIN) 1000 MCG tablet, Take 1 tablet (1,000 mcg total) by mouth daily., Disp: 30 tablet, Rfl: 2   Allergies  Allergen Reactions   Hydrocodone-Acetaminophen     Passed out and got real weak   Morphine And Related Other (See Comments)    Reaction:  Confusion/weakness/hallucinations   Valsartan Rash   Past Medical History:  Diagnosis Date   AAA (abdominal aortic aneurysm) ([place])    Brain tumor ([place])    x2   Cardiac arrhythmia    CHF (congestive  heart failure) ([place])    Guillain-Barre syndrome ([place]) Feb 13 1986   Hypertension    Kidney stones    Myocardial infarction [facility]) 2007    Past Surgical History:  Procedure Laterality Date   COLONOSCOPY WITH PROPOFOL N/A 08/14/2020   Procedure: COLONOSCOPY WITH PROPOFOL;  Surgeon: Rogene Houston, MD;  [facility];  Service: Endoscopy;  Laterality: N/A;   CORONARY ANGIOPLASTY  1997   after mi   CYSTOSCOPY WITH RETROGRADE PYELOGRAM, URETEROSCOPY AND STENT PLACEMENT Left 06/28/2014   Procedure: 1ST STAGE CYSTOSCOPY/URETEROSCOPY/STENT PLACEMENT;  Surgeon: Alexis Frock, MD;  Location: [facility];  Service: Urology;  Laterality: Left;   CYSTOSCOPY WITH STENT PLACEMENT Left 05/08/2014   Procedure: CYSTOSCOPY, RETROGRADE PYELOGRAM WITH LEFT URETERAL STENT PLACEMENT;  Surgeon: Alexis Frock, MD;  Location: [facility];  Service: Urology;  Laterality: Left;   ENTEROSCOPY N/A 09/16/2020   Procedure: PUSH ENTEROSCOPY;  Surgeon: Harvel Quale, MD;  [facility];  Service: Gastroenterology;  Laterality: N/A;   ENTEROSCOPY N/A 10/25/2020   Procedure: PUSH ENTEROSCOPY;  Surgeon: Harvel Quale, MD;  [facility];  Service: Gastroenterology;   Laterality: N/A;   ESOPHAGOGASTRODUODENOSCOPY (EGD) WITH PROPOFOL N/A 12/16/2019   Procedure: ESOPHAGOGASTRODUODENOSCOPY (EGD) WITH PROPOFOL;  Surgeon: Mauri Pole, MD;  [facility];  Service: Endoscopy;  Laterality: N/A;   ESOPHAGOGASTRODUODENOSCOPY (EGD) WITH PROPOFOL N/A 08/13/2020   Procedure: ESOPHAGOGASTRODUODENOSCOPY (EGD) WITH PROPOFOL;  Surgeon: Rogene Houston, MD;  [facility];  Service: Endoscopy;  Laterality: N/A;   ESOPHAGOGASTRODUODENOSCOPY (EGD) WITH PROPOFOL N/A 09/16/2020   Procedure: ESOPHAGOGASTRODUODENOSCOPY (EGD) WITH PROPOFOL;  Surgeon: Harvel Quale, MD;  [facility];  Service: Gastroenterology;  Laterality: N/A;  1:55   ESOPHAGOGASTRODUODENOSCOPY (EGD) WITH PROPOFOL N/A 10/25/2020   Procedure: ESOPHAGOGASTRODUODENOSCOPY (EGD) WITH PROPOFOL;  Surgeon: Harvel Quale, MD;  [facility];  Service: Gastroenterology;  Laterality: N/A;  2:00   EYE SURGERY Right may 2015   growth removed, july 2015left eye cataract removed, right eye catarct removed   gamma kniferadiation treatment  Feb 09 2014   [facility]   GIVENS CAPSULE STUDY N/A 10/11/2020   Procedure: GIVENS CAPSULE STUDY;  Surgeon: Harvel Quale, MD;  [facility];  Service: Gastroenterology;  Laterality: N/A;  7:30   HEMOSTASIS CLIP PLACEMENT  12/16/2019   Procedure: HEMOSTASIS CLIP PLACEMENT;  Surgeon: Mauri Pole, MD;  [facility];  Service: Endoscopy;;   HEMOSTASIS CONTROL  12/16/2019   Procedure: HEMOSTASIS CONTROL;  Surgeon: Mauri Pole, MD;  [facility];  Service: Endoscopy;;  Endoloop   HOLMIUM LASER APPLICATION Left 1/94/1740   Procedure: HOLMIUM LASER APPLICATION;  Surgeon: Alexis Frock, MD;  [facility];  Service: Urology;  Laterality: Left;   LITHOTRIPSY  years ago   POLYPECTOMY  12/16/2019   Procedure: POLYPECTOMY;  Surgeon: Mauri Pole, MD;   [facility];  Service: Endoscopy;;   STONE EXTRACTION WITH BASKET Left 06/28/2014   Procedure: STONE EXTRACTION WITH BASKET;  Surgeon: Alexis Frock, MD;  [facility];  Service: Urology;  Laterality: Left;    Social History   Socioeconomic History   Marital status: Married    Spouse name: shirley    Number of children: 1   Years of education: Not on file   Highest education level: Not on file  Occupational History   Occupation: retired   Tobacco Use   Smoking status: Never   Smokeless tobacco:  Never  Vaping Use   Vaping Use: Never used  Substance and Sexual Activity   Alcohol use: No   Drug use: No   Sexual activity: Not Currently  Other Topics Concern   Not on file  Social History Narrative   Lives with wife    Social Determinants of Health   Financial Resource Strain: Not on file  Food Insecurity: Not on file  Transportation Needs: Not on file  Physical Activity: Not on file  Stress: Not on file  Social Connections: Not on file  Intimate Partner Violence: Not on file        Objective:    BP (!) 150/69   Pulse (!) 59   Temp 97.6 F (36.4 C) (Temporal)   SpO2 98%   Wt Readings from Last 3 Encounters:  10/21/20 150 lb (68 kg)  09/26/20 150 lb (68 kg)  09/16/20 150 lb (68 kg)    Physical Exam Vitals reviewed.  Constitutional:      General: He is not in acute distress.    Appearance: Normal appearance. He is overweight. He is not ill-appearing, toxic-appearing or diaphoretic.  HENT:     Head: Normocephalic and atraumatic.  Eyes:     General: No scleral icterus.       Right eye: No discharge.        Left eye: No discharge.     Conjunctiva/sclera: Conjunctivae normal.  Cardiovascular:     Rate and Rhythm: Regular rhythm. Bradycardia present.     Heart sounds: Normal heart sounds. No murmur heard.   No friction rub. No gallop.  Pulmonary:     Effort: Pulmonary effort is normal. No respiratory distress.     Breath sounds: Normal breath  sounds. No stridor. No wheezing, rhonchi or rales.  Musculoskeletal:        General: Normal range of motion.     Cervical back: Normal range of motion.     Comments: Paralysis from the waist down.  Skin:    General: Skin is warm and dry.  Neurological:     Mental Status: He is alert and oriented to person, place, and time. Mental status is at baseline.     Gait: Gait abnormal (rides in [facility]).  Psychiatric:        Mood and Affect: Mood normal.        Behavior: Behavior normal.        Thought Content: Thought content normal.        Judgment: Judgment normal.    Lab Results  Component Value Date   TSH 3.590 05/06/2020   Lab Results  Component Value Date   WBC 6.8 10/21/2020   HGB 11.2 (L) 10/21/2020   HCT 35.5 (L) 10/21/2020   MCV 91.3 10/21/2020   PLT 169 10/21/2020   Lab Results  Component Value Date   NA 143 10/21/2020   K 4.0 10/21/2020   CO2 32 10/21/2020   GLUCOSE 107 (H) 10/21/2020   BUN 16 10/21/2020   CREATININE 0.77 10/21/2020   BILITOT 1.7 (H) 09/02/2020   ALKPHOS 59 09/02/2020   AST 11 (L) 09/02/2020   ALT 7 09/02/2020   PROT 6.2 (L) 09/02/2020   ALBUMIN 3.7 09/02/2020   CALCIUM 9.1 10/21/2020   ANIONGAP 5 10/21/2020   EGFR 91 08/23/2020   Lab Results  Component Value Date   CHOL 111 05/06/2020   Lab Results  Component Value Date   HDL 43 05/06/2020   Lab Results  Component Value Date  LDLCALC 57 05/06/2020   Lab Results  Component Value Date   TRIG 43 05/06/2020   Lab Results  Component Value Date   CHOLHDL 2.6 05/06/2020   Lab Results  Component Value Date   HGBA1C 4.7 05/06/2020

## 2020-11-02 ENCOUNTER — Telehealth: Payer: Self-pay | Admitting: *Deleted

## 2020-11-02 NOTE — Telephone Encounter (Signed)
I have removed it from his medication list.

## 2020-11-02 NOTE — Telephone Encounter (Signed)
[facility] Cardiology called to report they have stopped Plavix and want to see how patient responds and/or monitor if chest pains reoccur....Marland Kitchenfyi

## 2020-11-28 DIAGNOSIS — Z7901 Long term (current) use of anticoagulants: Secondary | ICD-10-CM | POA: Diagnosis not present

## 2020-11-28 DIAGNOSIS — I4819 Other persistent atrial fibrillation: Secondary | ICD-10-CM | POA: Diagnosis not present

## 2020-12-14 ENCOUNTER — Other Ambulatory Visit: Payer: Self-pay | Admitting: Family Medicine

## 2020-12-14 DIAGNOSIS — I10 Essential (primary) hypertension: Secondary | ICD-10-CM

## 2020-12-28 DIAGNOSIS — I480 Paroxysmal atrial fibrillation: Secondary | ICD-10-CM | POA: Diagnosis not present

## 2020-12-28 DIAGNOSIS — E7800 Pure hypercholesterolemia, unspecified: Secondary | ICD-10-CM | POA: Diagnosis not present

## 2020-12-28 DIAGNOSIS — I2510 Atherosclerotic heart disease of native coronary artery without angina pectoris: Secondary | ICD-10-CM | POA: Diagnosis not present

## 2020-12-28 DIAGNOSIS — Z7982 Long term (current) use of aspirin: Secondary | ICD-10-CM | POA: Diagnosis not present

## 2020-12-28 DIAGNOSIS — Z79899 Other long term (current) drug therapy: Secondary | ICD-10-CM | POA: Diagnosis not present

## 2021-02-01 ENCOUNTER — Encounter: Payer: Self-pay | Admitting: Family Medicine

## 2021-02-01 ENCOUNTER — Other Ambulatory Visit: Payer: Self-pay

## 2021-02-01 ENCOUNTER — Ambulatory Visit (INDEPENDENT_AMBULATORY_CARE_PROVIDER_SITE_OTHER): Payer: Medicare Other | Admitting: Family Medicine

## 2021-02-01 VITALS — BP 138/75 | HR 54 | Temp 97.5°F

## 2021-02-01 DIAGNOSIS — K219 Gastro-esophageal reflux disease without esophagitis: Secondary | ICD-10-CM

## 2021-02-01 DIAGNOSIS — I714 Abdominal aortic aneurysm, without rupture, unspecified: Secondary | ICD-10-CM

## 2021-02-01 DIAGNOSIS — Z0001 Encounter for general adult medical examination with abnormal findings: Secondary | ICD-10-CM | POA: Diagnosis not present

## 2021-02-01 DIAGNOSIS — I480 Paroxysmal atrial fibrillation: Secondary | ICD-10-CM | POA: Diagnosis not present

## 2021-02-01 DIAGNOSIS — G610 Guillain-Barre syndrome: Secondary | ICD-10-CM

## 2021-02-01 DIAGNOSIS — I252 Old myocardial infarction: Secondary | ICD-10-CM

## 2021-02-01 DIAGNOSIS — F332 Major depressive disorder, recurrent severe without psychotic features: Secondary | ICD-10-CM | POA: Diagnosis not present

## 2021-02-01 DIAGNOSIS — Z0000 Encounter for general adult medical examination without abnormal findings: Secondary | ICD-10-CM

## 2021-02-01 DIAGNOSIS — E782 Mixed hyperlipidemia: Secondary | ICD-10-CM

## 2021-02-01 DIAGNOSIS — Z7901 Long term (current) use of anticoagulants: Secondary | ICD-10-CM

## 2021-02-01 DIAGNOSIS — R601 Generalized edema: Secondary | ICD-10-CM

## 2021-02-01 DIAGNOSIS — Z8673 Personal history of transient ischemic attack (TIA), and cerebral infarction without residual deficits: Secondary | ICD-10-CM

## 2021-02-01 DIAGNOSIS — F411 Generalized anxiety disorder: Secondary | ICD-10-CM

## 2021-02-01 DIAGNOSIS — Z7189 Other specified counseling: Secondary | ICD-10-CM | POA: Diagnosis not present

## 2021-02-01 DIAGNOSIS — L989 Disorder of the skin and subcutaneous tissue, unspecified: Secondary | ICD-10-CM

## 2021-02-01 DIAGNOSIS — D500 Iron deficiency anemia secondary to blood loss (chronic): Secondary | ICD-10-CM

## 2021-02-01 DIAGNOSIS — R062 Wheezing: Secondary | ICD-10-CM

## 2021-02-01 DIAGNOSIS — I10 Essential (primary) hypertension: Secondary | ICD-10-CM

## 2021-02-01 DIAGNOSIS — G609 Hereditary and idiopathic neuropathy, unspecified: Secondary | ICD-10-CM

## 2021-02-01 MED ORDER — ALBUTEROL SULFATE HFA 108 (90 BASE) MCG/ACT IN AERS: 2.0000 | INHALATION_SPRAY | Freq: Four times a day (QID) | RESPIRATORY_TRACT | 2 refills | Status: DC | PRN

## 2021-02-01 MED ORDER — MIRTAZAPINE 7.5 MG PO TABS: 7.5000 mg | ORAL_TABLET | Freq: Every day | ORAL | 2 refills | Status: DC

## 2021-02-01 MED ORDER — GABAPENTIN 300 MG PO CAPS: ORAL_CAPSULE | ORAL | 1 refills | Status: DC

## 2021-02-01 MED ORDER — TAMSULOSIN HCL 0.4 MG PO CAPS: 0.4000 mg | ORAL_CAPSULE | Freq: Every day | ORAL | 1 refills | Status: DC

## 2021-02-01 MED ORDER — PANTOPRAZOLE SODIUM 40 MG PO TBEC: 40.0000 mg | DELAYED_RELEASE_TABLET | Freq: Two times a day (BID) | ORAL | 1 refills | Status: DC

## 2021-02-01 MED ORDER — PRAVASTATIN SODIUM 40 MG PO TABS: 40.0000 mg | ORAL_TABLET | Freq: Every day | ORAL | 1 refills | Status: DC

## 2021-02-01 MED ORDER — FUROSEMIDE 20 MG PO TABS: 20.0000 mg | ORAL_TABLET | Freq: Every day | ORAL | 1 refills | Status: DC

## 2021-02-01 NOTE — Progress Notes (Signed)
Assessment & Plan:  1. Well adult exam - printed wellness information provided - Anemia Profile B - CMP14+EGFR - Lipid panel  2. ACP (advance care planning) - information on advanced directives provided  3. Scalp lesion - Ambulatory referral to Dermatology  4. Severe episode of recurrent major depressive disorder, without psychotic features ([place]) - poorly controlled, adding mirtazapine - continue Lexapro as prescribed - mirtazapine (REMERON) 7.5 MG tablet; Take 1 tablet (7.5 mg total) by mouth at bedtime.  Dispense: 30 tablet; Refill: 2 - Anemia Profile B - CMP14+EGFR  5. Generalized anxiety disorder - poorly controlled, adding mirtazapine - continue Lexapro as prescribed - mirtazapine (REMERON) 7.5 MG tablet; Take 1 tablet (7.5 mg total) by mouth at bedtime.  Dispense: 30 tablet; Refill: 2 - CMP14+EGFR  6. Paroxysmal atrial fibrillation (HCC) - Well controlled on current regimen - AMB Referral to [facility] for Eliquis assistance - Anemia Profile B - CMP14+EGFR  7. Long term current use of anticoagulant - continue Eliquis for A-Fib  8. Iron deficiency anemia due to chronic blood loss - Anemia Profile B  9. Essential (primary) hypertension - Well controlled on current regimen - continue medications as prescribed  10. Guillain Barr syndrome ([place]) - gabapentin (NEURONTIN) 300 MG capsule; Take 300 mg by mouth three times daily  Dispense: 360 capsule; Refill: 1  11. Idiopathic peripheral neuropathy - Well controlled on current regimen - gabapentin (NEURONTIN) 300 MG capsule; Take 300 mg by mouth three times daily  Dispense: 360 capsule; Refill: 1 - CMP14+EGFR  12. Mixed hyperlipidemia - continue statin - pravastatin (PRAVACHOL) 40 MG tablet; Take 1 tablet (40 mg total) by mouth daily.  Dispense: 90 tablet; Refill: 1 - CMP14+EGFR - Lipid panel  13. Abdominal aortic aneurysm (AAA) without rupture - continue statin - pravastatin (PRAVACHOL) 40  MG tablet; Take 1 tablet (40 mg total) by mouth daily.  Dispense: 90 tablet; Refill: 1 - CMP14+EGFR - Lipid panel  14. History of non-ST elevation myocardial infarction (NSTEMI) - continue statin - pravastatin (PRAVACHOL) 40 MG tablet; Take 1 tablet (40 mg total) by mouth daily.  Dispense: 90 tablet; Refill: 1 - CMP14+EGFR - Lipid panel  15. History of TIA (transient ischemic attack) - continue statin - pravastatin (PRAVACHOL) 40 MG tablet; Take 1 tablet (40 mg total) by mouth daily.  Dispense: 90 tablet; Refill: 1 - CMP14+EGFR - Lipid panel  16. Generalized edema - Well controlled on current regimen - furosemide (LASIX) 20 MG tablet; Take 1 tablet (20 mg total) by mouth daily.  Dispense: 90 tablet; Refill: 1 - CMP14+EGFR  17. Wheezing - encouraged to use albuterol when needed for wheezing - albuterol (VENTOLIN HFA) 108 (90 Base) MCG/ACT inhaler; Inhale 2 puffs into the lungs every 6 (six) hours as needed for wheezing or shortness of breath.  Dispense: 18 g; Refill: 2  18. Gastroesophageal reflux disease, unspecified whether esophagitis present - Well controlled on current regimen - pantoprazole (PROTONIX) 40 MG tablet; Take 1 tablet (40 mg total) by mouth 2 (two) times daily.  Dispense: 180 tablet; Refill: 1   Follow-up: Return in about 4 months (around 06/01/2021) for follow-up of chronic medication conditions.   Lucile Crater, NP Student  I personally was present during the history, physical exam, and medical decision-making activities of this service and have verified that the service and findings are accurately documented in the nurse practitioner student's note.  [name], MSN, APRN, FNP-C [facility]   Subjective:  Patient ID: Theodore Mendoza  Theodore Mendoza, male    DOB: Jul 29, 1940  Age: 80 y.o. MRN: [id number]  Patient Care Team: Loman Brooklyn, FNP as PCP - General (Family Medicine) Loretta Plume as Consulting Physician (Neurosurgery) Alexis Frock,  MD as Consulting Physician (Urology) Dudley Major, MD as Consulting Physician (Cardiology) Lavera Guise, [facility] (Pharmacist)   CC:  Chief Complaint  Patient presents with   Annual Exam   place on forehead    Patient states that it has been looked at before and he thinks he was bite by an insect.     HPI Theodore Mendoza presents for annual physical.    Health Maintenance: Occupation: retired, Marital status: married, Substance use: none Diet: well-balanced, Exercise: none, wheelchair bound Last eye exam: 2021, Dr. Wynetta Emery Last dental exam: has appointment 02/08/21 Immunizations: Flu Vaccine: declined Tdap Vaccine: declined  Shingrix Vaccine: declined  COVID-19 Vaccine: declined Pneumonia Vaccine: up to date  Advanced Directives Patient does not have advanced directives including DNR, living will, healthcare power of attorney, financial power of attorney, and MOST form. He does not have a copy in the electronic medical record. He is interested in information on this.    Depression/Anxiety He takes Lexapro for depression and anxiety. He states he occasionally has days where he thinks about hurting himself. He denies a plan to hurt himself. He states this is better since he started Lexapro and occurs less frequently.   Depression screen [facility] 2/9 02/01/2021 11/01/2020 09/01/2020  Decreased Interest 2 0 3  Down, Depressed, Hopeless _0 PHQ - 2 Score _1 Altered sleeping _2 Tired, decreased energy _3 Change in appetite 2 0 2  Feeling bad or failure about yourself  2 0 2  Trouble concentrating 3 0 2  Moving slowly or fidgety/restless _4 Suicidal thoughts 1 0 2  PHQ-9 Score _5 Difficult doing work/chores Not difficult at all Somewhat difficult Extremely dIfficult  Some recent data might be hidden   GAD 7 : Generalized Anxiety Score 02/01/2021 11/01/2020 09/01/2020 08/23/2020  Nervous, Anxious, on Edge _6 Control/stop worrying _7 Worry too much -  different things 2 0 3 3  Trouble relaxing _8 Restless _9 Easily annoyed or irritable _10 Afraid - awful might happen _11 Total GAD 7 Score _12 Anxiety Difficulty Somewhat difficult Somewhat difficult Somewhat difficult Somewhat difficult   New Complaints: He has a lesion on his forehead he states that he thinks was caused by an insect bite. He states this lesion has been present for 8 months. He declined a previous offer for dermatology referral but is interested today. He states he has been putting iodine swabs on it and occasionally it has drainage that he squeezes out, which is painful. He states that when it is not draining it is not painful.    Review of Systems  Constitutional: Negative.  Negative for chills, fever, malaise/fatigue and weight loss.  HENT: Negative.  Negative for congestion, ear discharge, ear pain, hearing loss, sinus pain and sore throat.   Eyes: Negative.  Negative for blurred vision, pain, discharge and redness.  Respiratory:  Positive for sputum production (at night) and wheezing. Negative for cough and shortness of breath.   Cardiovascular:  Positive for leg swelling. Negative for chest pain, palpitations and  orthopnea.  Gastrointestinal: Negative.  Negative for abdominal pain, constipation, diarrhea, heartburn, nausea and vomiting.  Genitourinary: Negative.  Negative for dysuria, frequency and urgency.  Musculoskeletal: Negative.  Negative for back pain, falls, joint pain, myalgias and neck pain.  Skin: Negative.  Negative for itching and rash.  Neurological:  Positive for weakness (chronic from GB). Negative for dizziness, tremors and headaches.  Endo/Heme/Allergies: Negative.  Negative for environmental allergies and polydipsia. Does not bruise/bleed easily.  Psychiatric/Behavioral: Negative.  Negative for depression, memory loss and substance abuse. The patient is not nervous/anxious and does not have insomnia.     Current  Outpatient Medications:    albuterol (VENTOLIN HFA) 108 (90 Base) MCG/ACT inhaler, Inhale 2 puffs into the lungs every 6 (six) hours as needed for wheezing or shortness of breath., Disp: 18 g, Rfl: 2   amLODipine (NORVASC) 5 MG tablet, Take 1 tablet (5 mg total) by mouth daily., Disp: 90 tablet, Rfl: 1   apixaban (ELIQUIS) 5 MG TABS tablet, Take 1 tablet (5 mg total) by mouth 2 (two) times daily., Disp: 180 tablet, Rfl: 3   dofetilide (TIKOSYN) 500 MCG capsule, Take 1 capsule (500 mcg total) by mouth 2 (two) times daily., Disp: 180 capsule, Rfl: 3   escitalopram (LEXAPRO) 20 MG tablet, Take 1 tablet (20 mg total) by mouth daily., Disp: 30 tablet, Rfl: 5   Ferrous Sulfate (IRON) 325 (65 Fe) MG TABS, Take 1 tablet (325 mg total) by mouth daily., Disp: 180 tablet, Rfl: 2   lisinopril (ZESTRIL) 5 MG tablet, TAKE 1 TABLET ONCE DAILY, Disp: 90 tablet, Rfl: 0   mirtazapine (REMERON) 7.5 MG tablet, Take 1 tablet (7.5 mg total) by mouth at bedtime., Disp: 30 tablet, Rfl: 2   sucralfate (CARAFATE) 1 g tablet, Take 1 tablet (1 g total) by mouth 2 (two) times daily., Disp: 180 tablet, Rfl: 1   vitamin B-12 (CYANOCOBALAMIN) 1000 MCG tablet, Take 1 tablet (1,000 mcg total) by mouth daily., Disp: 30 tablet, Rfl: 2   furosemide (LASIX) 20 MG tablet, Take 1 tablet (20 mg total) by mouth daily., Disp: 90 tablet, Rfl: 1   gabapentin (NEURONTIN) 300 MG capsule, Take 300 mg by mouth three times daily, Disp: 360 capsule, Rfl: 1   pantoprazole (PROTONIX) 40 MG tablet, Take 1 tablet (40 mg total) by mouth 2 (two) times daily., Disp: 180 tablet, Rfl: 1   pravastatin (PRAVACHOL) 40 MG tablet, Take 1 tablet (40 mg total) by mouth daily., Disp: 90 tablet, Rfl: 1   tamsulosin (FLOMAX) 0.4 MG CAPS capsule, Take 1 capsule (0.4 mg total) by mouth daily., Disp: 90 capsule, Rfl: 1  Allergies  Allergen Reactions   Hydrocodone-Acetaminophen     Passed out and got real weak   Morphine And Related Other (See Comments)    Reaction:   Confusion/weakness/hallucinations   Valsartan Rash    Past Medical History:  Diagnosis Date   AAA (abdominal aortic aneurysm)    Brain tumor (HCC)    x2   Cardiac arrhythmia    CHF (congestive heart failure) ([place])    Guillain-Barre syndrome ([place]) Feb 13 1986   Hypertension    Kidney stones    Myocardial infarction ([place]) 2007    Past Surgical History:  Procedure Laterality Date   COLONOSCOPY WITH PROPOFOL N/A 08/14/2020   Procedure: COLONOSCOPY WITH PROPOFOL;  Surgeon: Rogene Houston, MD;  [facility];  Service: Endoscopy;  Laterality: N/A;   CORONARY ANGIOPLASTY  1997   after mi  CYSTOSCOPY WITH RETROGRADE PYELOGRAM, URETEROSCOPY AND STENT PLACEMENT Left 06/28/2014   Procedure: 1ST STAGE CYSTOSCOPY/URETEROSCOPY/STENT PLACEMENT;  Surgeon: Alexis Frock, MD;  [facility]: [facility];  Service: Urology;  Laterality: Left;   CYSTOSCOPY WITH STENT PLACEMENT Left 05/08/2014   Procedure: CYSTOSCOPY, RETROGRADE PYELOGRAM WITH LEFT URETERAL STENT PLACEMENT;  Surgeon: Alexis Frock, MD;  [facility];  Service: Urology;  Laterality: Left;   ENTEROSCOPY N/A 09/16/2020   Procedure: PUSH ENTEROSCOPY;  Surgeon: Harvel Quale, MD;  [facility];  Service: Gastroenterology;  Laterality: N/A;   ENTEROSCOPY N/A 10/25/2020   Procedure: PUSH ENTEROSCOPY;  Surgeon: Harvel Quale, MD;  [facility];  Service: Gastroenterology;  Laterality: N/A;   ESOPHAGOGASTRODUODENOSCOPY (EGD) WITH PROPOFOL N/A 12/16/2019   Procedure: ESOPHAGOGASTRODUODENOSCOPY (EGD) WITH PROPOFOL;  Surgeon: Mauri Pole, MD;  [facility];  Service: Endoscopy;  Laterality: N/A;   ESOPHAGOGASTRODUODENOSCOPY (EGD) WITH PROPOFOL N/A 08/13/2020   Procedure: ESOPHAGOGASTRODUODENOSCOPY (EGD) WITH PROPOFOL;  Surgeon: Rogene Houston, MD;  [facility];  Service: Endoscopy;  Laterality: N/A;   ESOPHAGOGASTRODUODENOSCOPY (EGD) WITH PROPOFOL N/A 09/16/2020    Procedure: ESOPHAGOGASTRODUODENOSCOPY (EGD) WITH PROPOFOL;  Surgeon: Harvel Quale, MD;  [facility];  Service: Gastroenterology;  Laterality: N/A;  1:55   ESOPHAGOGASTRODUODENOSCOPY (EGD) WITH PROPOFOL N/A 10/25/2020   Procedure: ESOPHAGOGASTRODUODENOSCOPY (EGD) WITH PROPOFOL;  Surgeon: Harvel Quale, MD;  [facility];  Service: Gastroenterology;  Laterality: N/A;  2:00   EYE SURGERY Right may 2015   growth removed, july 2015left eye cataract removed, right eye catarct removed   gamma kniferadiation treatment  Feb 09 2014   [facility]   GIVENS CAPSULE STUDY N/A 10/11/2020   Procedure: GIVENS CAPSULE STUDY;  Surgeon: Harvel Quale, MD;  [facility];  Service: Gastroenterology;  Laterality: N/A;  7:30   HEMOSTASIS CLIP PLACEMENT  12/16/2019   Procedure: HEMOSTASIS CLIP PLACEMENT;  Surgeon: Mauri Pole, MD;  [facility];  Service: Endoscopy;;   HEMOSTASIS CONTROL  12/16/2019   Procedure: HEMOSTASIS CONTROL;  Surgeon: Mauri Pole, MD;  [facility];  Service: Endoscopy;;  Endoloop   HOLMIUM LASER APPLICATION Left 1/79/1505   Procedure: HOLMIUM LASER APPLICATION;  Surgeon: Alexis Frock, MD;  [facility];  Service: Urology;  Laterality: Left;   LITHOTRIPSY  years ago   POLYPECTOMY  12/16/2019   Procedure: POLYPECTOMY;  Surgeon: Mauri Pole, MD;  [facility];  Service: Endoscopy;;   STONE EXTRACTION WITH BASKET Left 06/28/2014   Procedure: STONE EXTRACTION WITH BASKET;  Surgeon: Alexis Frock, MD;  [facility];  Service: Urology;  Laterality: Left;    Family History  Problem Relation Age of Onset   Diabetes Mother    Lung cancer Mother    CAD Father    Diabetes Brother    Diabetes Sister    Stroke Sister    Other Maternal Grandfather        brain tumor   Colon cancer Neg Hx    Pancreatic cancer Neg Hx    Esophageal cancer Neg Hx     Social  History   Socioeconomic History   Marital status: Married    Spouse name: shirley    Number of children: 1   Years of education: Not on file   Highest education level: Not on file  Occupational History   Occupation: retired   Tobacco Use   Smoking status: Never   Smokeless tobacco: Never  Vaping Use   Vaping Use: Never used  Substance and Sexual Activity   Alcohol use: No   Drug use: No   Sexual activity: Not Currently  Other Topics Concern   Not on file  Social History Narrative   Lives with wife    Social Determinants of Health   Financial Resource Strain: Not on file  Food Insecurity: Not on file  Transportation Needs: Not on file  Physical Activity: Not on file  Stress: Not on file  Social Connections: Not on file  Intimate Partner Violence: Not on file      Objective:    BP 138/75   Pulse (!) 54   Temp (!) 97.5 F (36.4 C) (Temporal)   SpO2 99%   Wt Readings from Last 3 Encounters:  10/21/20 150 lb (68 kg)  09/26/20 150 lb (68 kg)  09/16/20 150 lb (68 kg)    Physical Exam Vitals reviewed.  Constitutional:      General: He is not in acute distress.    Appearance: Normal appearance. He is normal weight. He is not ill-appearing, toxic-appearing or diaphoretic.  HENT:     Head: Normocephalic and atraumatic.     Right Ear: Tympanic membrane, ear canal and external ear normal.     Left Ear: Tympanic membrane, ear canal and external ear normal.     Nose: Nose normal.     Mouth/Throat:     Mouth: Mucous membranes are moist.     Pharynx: Oropharynx is clear. No oropharyngeal exudate or posterior oropharyngeal erythema.  Eyes:     General: No scleral icterus.       Right eye: No discharge.        Left eye: No discharge.     Extraocular Movements: Extraocular movements intact.     Conjunctiva/sclera: Conjunctivae normal.     Pupils: Pupils are equal, round, and reactive to light.  Cardiovascular:     Rate and Rhythm: Normal rate and regular rhythm.      Heart sounds: Normal heart sounds. No murmur heard.   No friction rub. No gallop.  Pulmonary:     Effort: Pulmonary effort is normal. No respiratory distress.     Breath sounds: Normal breath sounds. No stridor. No wheezing, rhonchi or rales.  Abdominal:     General: Abdomen is flat. Bowel sounds are normal. There is no distension.     Palpations: Abdomen is soft. There is no mass.     Hernia: No hernia is present.  Musculoskeletal:        General: Normal range of motion.     Cervical back: Normal range of motion.     Right lower leg: Edema present.     Left lower leg: Edema present.  Skin:    General: Skin is warm and dry.     Capillary Refill: Capillary refill takes less than 2 seconds.     Findings: Lesion present.     Comments: Irregular, crusted, nickel-sized lesion on right lateral forehead  Neurological:     Mental Status: He is alert and oriented to person, place, and time. Mental status is at baseline.     Motor: Weakness present.     Gait: Gait abnormal.  Psychiatric:        Mood and Affect: Mood normal.        Behavior: Behavior normal.        Thought Content: Thought content normal.        Judgment: Judgment normal.  Lab Results  Component Value Date   TSH 3.590 05/06/2020   Lab Results  Component Value Date   WBC 6.8 10/21/2020   HGB 11.2 (Theodore) 10/21/2020   HCT 35.5 (Theodore) 10/21/2020   MCV 91.3 10/21/2020   PLT 169 10/21/2020   Lab Results  Component Value Date   NA 143 10/21/2020   K 4.0 10/21/2020   CO2 32 10/21/2020   GLUCOSE 107 (H) 10/21/2020   BUN 16 10/21/2020   CREATININE 0.77 10/21/2020   BILITOT 1.7 (H) 09/02/2020   ALKPHOS 59 09/02/2020   AST 11 (Theodore) 09/02/2020   ALT 7 09/02/2020   PROT 6.2 (Theodore) 09/02/2020   ALBUMIN 3.7 09/02/2020   CALCIUM 9.1 10/21/2020   ANIONGAP 5 10/21/2020   EGFR 91 08/23/2020   Lab Results  Component Value Date   CHOL 111 05/06/2020   Lab Results  Component Value Date   HDL 43 05/06/2020   Lab Results   Component Value Date   LDLCALC 57 05/06/2020   Lab Results  Component Value Date   TRIG 43 05/06/2020   Lab Results  Component Value Date   CHOLHDL 2.6 05/06/2020   Lab Results  Component Value Date   HGBA1C 4.7 05/06/2020

## 2021-02-03 ENCOUNTER — Telehealth: Payer: Self-pay

## 2021-02-03 NOTE — Chronic Care Management (AMB) (Signed)
  Chronic Care Management   Note  02/03/2021 Name: Theodore Mendoza MRN: [id number] DOB: [birth year]  [name] is a 80 y.o. year old male who is a primary care patient of Loman Brooklyn, FNP. I reached out to [place] by phone today in response to a referral sent by [name].  [name] about Chronic Care Management services today including:  CCM service includes personalized support from designated clinical staff supervised by his physician, including individualized plan of care and coordination with other care providers 24/7 contact phone numbers for assistance for urgent and routine care needs. Service will only be billed when office clinical staff spend 20 minutes or more in a month to coordinate care. Only one practitioner may furnish and bill the service in a calendar month. The patient may stop CCM services at any time (effective at the end of the month) by phone call to the office staff. The patient is responsible for co-pay (up to 20% after annual deductible is met) if co-pay is required by the individual health plan.   Patient agreed to services and verbal consent obtained.   Follow up plan: Telephone appointment with care management team member scheduled for:03/22/2021  Noreene Larsson, [address], [place], [place] [address] Direct Dial: [phone] [email or web address]: [email or web address]

## 2021-02-06 ENCOUNTER — Encounter: Payer: Self-pay | Admitting: Family Medicine

## 2021-02-07 LAB — ANEMIA PROFILE B
Basophils Absolute: 0.1 10*3/uL (ref 0.0–0.2)
Basos: 2 %
EOS (ABSOLUTE): 0.2 10*3/uL (ref 0.0–0.4)
Eos: 4 %
Ferritin: 44 ng/mL (ref 30–400)
Folate: 11.8 ng/mL (ref >3.0)
Hematocrit: 35 % — ABNORMAL LOW (ref 37.5–51.0)
Hemoglobin: 11 g/dL — ABNORMAL LOW (ref 13.0–17.7)
Immature Grans (Abs): 0 10*3/uL (ref 0.0–0.1)
Immature Granulocytes: 0 %
Iron Saturation: 23 % (ref 15–55)
Iron: 65 ug/dL (ref 38–169)
Lymphocytes Absolute: 1.3 10*3/uL (ref 0.7–3.1)
Lymphs: 22 %
MCH: 28.3 pg (ref 26.6–33.0)
MCHC: 31.4 g/dL — ABNORMAL LOW (ref 31.5–35.7)
MCV: 90 fL (ref 79–97)
Monocytes Absolute: 0.3 10*3/uL (ref 0.1–0.9)
Monocytes: 6 %
Neutrophils Absolute: 3.8 10*3/uL (ref 1.4–7.0)
Neutrophils: 66 %
Platelets: 205 10*3/uL (ref 150–450)
RBC: 3.89 x10E6/uL — ABNORMAL LOW (ref 4.14–5.80)
RDW: 12.8 % (ref 11.6–15.4)
Retic Ct Pct: 2 % (ref 0.6–2.6)
Total Iron Binding Capacity: 287 ug/dL (ref 250–450)
UIBC: 222 ug/dL (ref 111–343)
Vitamin B-12: 1478 pg/mL — ABNORMAL HIGH (ref 232–1245)
WBC: 5.7 10*3/uL (ref 3.4–10.8)

## 2021-02-07 LAB — LIPID PANEL
Chol/HDL Ratio: 2.2 ratio (ref 0.0–5.0)
Cholesterol, Total: 107 mg/dL (ref 100–199)
HDL: 48 mg/dL (ref >39)
LDL Chol Calc (NIH): 47 mg/dL (ref 0–99)
Triglycerides: 52 mg/dL (ref 0–149)
VLDL Cholesterol Cal: 12 mg/dL (ref 5–40)

## 2021-02-07 LAB — CMP14+EGFR
ALT: 6 IU/L (ref 0–44)
AST: 13 IU/L (ref 0–40)
Albumin/Globulin Ratio: 2 (ref 1.2–2.2)
Albumin: 3.8 g/dL (ref 3.6–4.6)
Alkaline Phosphatase: 66 IU/L (ref 44–121)
BUN/Creatinine Ratio: 20 (ref 10–24)
BUN: 16 mg/dL (ref 8–27)
Bilirubin Total: 0.4 mg/dL (ref 0.0–1.2)
CO2: 28 mmol/L (ref 20–29)
Calcium: 9.2 mg/dL (ref 8.6–10.2)
Chloride: 105 mmol/L (ref 96–106)
Creatinine, Ser: 0.82 mg/dL (ref 0.76–1.27)
Globulin, Total: 1.9 g/dL (ref 1.5–4.5)
Glucose: 96 mg/dL (ref 70–99)
Potassium: 4.7 mmol/L (ref 3.5–5.2)
Sodium: 144 mmol/L (ref 134–144)
Total Protein: 5.7 g/dL — ABNORMAL LOW (ref 6.0–8.5)
eGFR: 88 mL/min/{1.73_m2} (ref >59)

## 2021-02-25 ENCOUNTER — Other Ambulatory Visit: Payer: Self-pay | Admitting: Family Medicine

## 2021-02-25 DIAGNOSIS — I10 Essential (primary) hypertension: Secondary | ICD-10-CM

## 2021-02-25 DIAGNOSIS — F332 Major depressive disorder, recurrent severe without psychotic features: Secondary | ICD-10-CM

## 2021-02-25 DIAGNOSIS — G459 Transient cerebral ischemic attack, unspecified: Secondary | ICD-10-CM

## 2021-02-25 DIAGNOSIS — F419 Anxiety disorder, unspecified: Secondary | ICD-10-CM

## 2021-02-27 DIAGNOSIS — R34 Anuria and oliguria: Secondary | ICD-10-CM | POA: Diagnosis not present

## 2021-02-27 DIAGNOSIS — N202 Calculus of kidney with calculus of ureter: Secondary | ICD-10-CM | POA: Diagnosis not present

## 2021-02-27 DIAGNOSIS — N480 Leukoplakia of penis: Secondary | ICD-10-CM | POA: Diagnosis not present

## 2021-02-27 DIAGNOSIS — N281 Cyst of kidney, acquired: Secondary | ICD-10-CM | POA: Diagnosis not present

## 2021-03-09 DIAGNOSIS — D044 Carcinoma in situ of skin of scalp and neck: Secondary | ICD-10-CM | POA: Diagnosis not present

## 2021-03-09 DIAGNOSIS — C4442 Squamous cell carcinoma of skin of scalp and neck: Secondary | ICD-10-CM | POA: Diagnosis not present

## 2021-03-22 ENCOUNTER — Telehealth: Payer: Medicare Other

## 2021-03-23 DIAGNOSIS — Z79899 Other long term (current) drug therapy: Secondary | ICD-10-CM | POA: Diagnosis not present

## 2021-03-23 DIAGNOSIS — L0100 Impetigo, unspecified: Secondary | ICD-10-CM | POA: Diagnosis not present

## 2021-04-10 ENCOUNTER — Other Ambulatory Visit (HOSPITAL_COMMUNITY): Payer: Medicare Other

## 2021-04-10 ENCOUNTER — Ambulatory Visit: Payer: Medicare Other

## 2021-04-24 ENCOUNTER — Ambulatory Visit (HOSPITAL_COMMUNITY)
Admission: RE | Admit: 2021-04-24 | Discharge: 2021-04-24 | Disposition: A | Discharge status: Home / Self Care | Payer: Medicare Other | Source: Ambulatory Visit | Attending: Surgery | Admitting: Surgery

## 2021-04-24 ENCOUNTER — Other Ambulatory Visit: Payer: Self-pay

## 2021-04-24 ENCOUNTER — Ambulatory Visit: Payer: Medicare Other | Admitting: Physician Assistant

## 2021-04-24 VITALS — BP 132/64 | HR 64 | Temp 98.0°F | Resp 20 | Ht 68.0 in | Wt 150.0 lb

## 2021-04-24 DIAGNOSIS — I7143 Infrarenal abdominal aortic aneurysm, without rupture: Secondary | ICD-10-CM | POA: Diagnosis not present

## 2021-04-24 DIAGNOSIS — I714 Abdominal aortic aneurysm, without rupture, unspecified: Secondary | ICD-10-CM | POA: Insufficient documentation

## 2021-04-24 NOTE — Progress Notes (Signed)
[facility] HISTORY AND PHYSICAL   History of Present Illness:  Patient is a 81 y.o. year old male who presents for evaluation of abdominal aortic aneurysm.  The AAA was detected on CT scan in 2021 during work up for GI bleed.  He denies new symptoms of abdominal pain or lumbar pain.    He denies ischemic changes to B LE.  No history of non healing wounds.  He has known B SFA occlusion.    He has history of Guillain Barre Syndrome, CVA/TIA and Afib.  He is medically managed on Eliquis and daily Statin.  His mobility is dependent on a WC.      Past Medical History:  Diagnosis Date   AAA (abdominal aortic aneurysm)    Brain tumor [facility])    x2   Cardiac arrhythmia    CHF (congestive heart failure) ([place])    Guillain-Barre syndrome ([place]) Feb 13 1986   Hypertension    Kidney stones    Myocardial infarction ([place]) 2007    Past Surgical History:  Procedure Laterality Date   COLONOSCOPY WITH PROPOFOL N/A 08/14/2020   Procedure: COLONOSCOPY WITH PROPOFOL;  Surgeon: Rogene Houston, MD;  [facility];  Service: Endoscopy;  Laterality: N/A;   CORONARY ANGIOPLASTY  1997   after mi   CYSTOSCOPY WITH RETROGRADE PYELOGRAM, URETEROSCOPY AND STENT PLACEMENT Left 06/28/2014   Procedure: 1ST STAGE CYSTOSCOPY/URETEROSCOPY/STENT PLACEMENT;  Surgeon: Alexis Frock, MD;  Location: [facility];  Service: Urology;  Laterality: Left;   CYSTOSCOPY WITH STENT PLACEMENT Left 05/08/2014   Procedure: CYSTOSCOPY, RETROGRADE PYELOGRAM WITH LEFT URETERAL STENT PLACEMENT;  Surgeon: Alexis Frock, MD;  [facility]: [facility];  Service: Urology;  Laterality: Left;   ENTEROSCOPY N/A 09/16/2020   Procedure: PUSH ENTEROSCOPY;  Surgeon: Harvel Quale, MD;  [facility];  Service: Gastroenterology;  Laterality: N/A;   ENTEROSCOPY N/A 10/25/2020   Procedure: PUSH ENTEROSCOPY;  Surgeon: Harvel Quale, MD;  [facility];  Service: Gastroenterology;   Laterality: N/A;   ESOPHAGOGASTRODUODENOSCOPY (EGD) WITH PROPOFOL N/A 12/16/2019   Procedure: ESOPHAGOGASTRODUODENOSCOPY (EGD) WITH PROPOFOL;  Surgeon: Mauri Pole, MD;  [facility];  Service: Endoscopy;  Laterality: N/A;   ESOPHAGOGASTRODUODENOSCOPY (EGD) WITH PROPOFOL N/A 08/13/2020   Procedure: ESOPHAGOGASTRODUODENOSCOPY (EGD) WITH PROPOFOL;  Surgeon: Rogene Houston, MD;  [facility];  Service: Endoscopy;  Laterality: N/A;   ESOPHAGOGASTRODUODENOSCOPY (EGD) WITH PROPOFOL N/A 09/16/2020   Procedure: ESOPHAGOGASTRODUODENOSCOPY (EGD) WITH PROPOFOL;  Surgeon: Harvel Quale, MD;  [facility];  Service: Gastroenterology;  Laterality: N/A;  1:55   ESOPHAGOGASTRODUODENOSCOPY (EGD) WITH PROPOFOL N/A 10/25/2020   Procedure: ESOPHAGOGASTRODUODENOSCOPY (EGD) WITH PROPOFOL;  Surgeon: Harvel Quale, MD;  [facility];  Service: Gastroenterology;  Laterality: N/A;  2:00   EYE SURGERY Right may 2015   growth removed, july 2015left eye cataract removed, right eye catarct removed   gamma kniferadiation treatment  Feb 09 2014   [facility]   GIVENS CAPSULE STUDY N/A 10/11/2020   Procedure: GIVENS CAPSULE STUDY;  Surgeon: Harvel Quale, MD;  [facility];  Service: Gastroenterology;  Laterality: N/A;  7:30   HEMOSTASIS CLIP PLACEMENT  12/16/2019   Procedure: HEMOSTASIS CLIP PLACEMENT;  Surgeon: Mauri Pole, MD;  [facility];  Service: Endoscopy;;   HEMOSTASIS CONTROL  12/16/2019   Procedure: HEMOSTASIS CONTROL;  Surgeon: Mauri Pole, MD;  [facility];  Service: Endoscopy;;  Endoloop  HOLMIUM LASER APPLICATION Left 1/61/0960   Procedure: HOLMIUM LASER APPLICATION;  Surgeon: Alexis Frock, MD;  [facility];  Service: Urology;  Laterality: Left;   LITHOTRIPSY  years ago   POLYPECTOMY  12/16/2019   Procedure: POLYPECTOMY;  Surgeon: Mauri Pole, MD;   [facility];  Service: Endoscopy;;   STONE EXTRACTION WITH BASKET Left 06/28/2014   Procedure: STONE EXTRACTION WITH BASKET;  Surgeon: Alexis Frock, MD;  [facility];  Service: Urology;  Laterality: Left;     Social History Social History   Tobacco Use   Smoking status: Never   Smokeless tobacco: Never  Vaping Use   Vaping Use: Never used  Substance Use Topics   Alcohol use: No   Drug use: No    Family History Family History  Problem Relation Age of Onset   Diabetes Mother    Lung cancer Mother    CAD Father    Diabetes Brother    Diabetes Sister    Stroke Sister    Other Maternal Grandfather        brain tumor   Colon cancer Neg Hx    Pancreatic cancer Neg Hx    Esophageal cancer Neg Hx     Allergies  Allergies  Allergen Reactions   Hydrocodone-Acetaminophen     Passed out and got real weak   Morphine And Related Other (See Comments)    Reaction:  Confusion/weakness/hallucinations   Valsartan Rash     Current Outpatient Medications  Medication Sig Dispense Refill   albuterol (VENTOLIN HFA) 108 (90 Base) MCG/ACT inhaler Inhale 2 puffs into the lungs every 6 (six) hours as needed for wheezing or shortness of breath. 18 g 2   amLODipine (NORVASC) 5 MG tablet Take 1 tablet (5 mg total) by mouth daily. 90 tablet 1   apixaban (ELIQUIS) 5 MG TABS tablet Take 1 tablet (5 mg total) by mouth 2 (two) times daily. 180 tablet 3   dofetilide (TIKOSYN) 500 MCG capsule Take 1 capsule (500 mcg total) by mouth 2 (two) times daily. 180 capsule 3   escitalopram (LEXAPRO) 20 MG tablet TAKE ONE TABLET ONCE DAILY 30 tablet 3   Ferrous Sulfate (IRON) 325 (65 Fe) MG TABS Take 1 tablet (325 mg total) by mouth daily. 180 tablet 2   furosemide (LASIX) 20 MG tablet Take 1 tablet (20 mg total) by mouth daily. 90 tablet 1   gabapentin (NEURONTIN) 300 MG capsule Take 300 mg by mouth three times daily 360 capsule 1   lisinopril (ZESTRIL) 5 MG tablet TAKE 1 TABLET ONCE DAILY  90 tablet 0   mirtazapine (REMERON) 7.5 MG tablet Take 1 tablet (7.5 mg total) by mouth at bedtime. 30 tablet 2   pantoprazole (PROTONIX) 40 MG tablet Take 1 tablet (40 mg total) by mouth 2 (two) times daily. 180 tablet 1   pravastatin (PRAVACHOL) 40 MG tablet Take 1 tablet (40 mg total) by mouth daily. 90 tablet 1   sucralfate (CARAFATE) 1 g tablet Take 1 tablet (1 g total) by mouth 2 (two) times daily. 180 tablet 1   tamsulosin (FLOMAX) 0.4 MG CAPS capsule Take 1 capsule (0.4 mg total) by mouth daily. 90 capsule 1   vitamin B-12 (CYANOCOBALAMIN) 1000 MCG tablet Take 1 tablet (1,000 mcg total) by mouth daily. 30 tablet 2   No current facility-administered medications for this visit.    ROS:   General:  No weight loss, Fever, chills  HEENT: No recent headaches, no nasal bleeding, no  visual changes, no sore throat  Neurologic: No dizziness, blackouts, seizures. No recent symptoms of stroke or mini- stroke. No recent episodes of slurred speech, or temporary blindness. Generalized weakness at [place]  Cardiac: No recent episodes of chest pain/pressure, no shortness of breath at rest.  No shortness of breath with exertion.  Denies history of atrial fibrillation or irregular heartbeat  Vascular: No history of rest pain in feet.  No history of claudication.  No history of non-healing ulcer, No history of DVT   Pulmonary: No home oxygen, no productive cough, no hemoptysis,  No asthma or wheezing  Musculoskeletal:  [ ]  Arthritis, [ ]  Low back pain,  [ ]  Joint pain  Hematologic:No history of hypercoagulable state.  No history of easy bleeding.  No history of anemia  Gastrointestinal: No hematochezia or melena,  No gastroesophageal reflux, no trouble swallowing  Urinary: [ ]  chronic Kidney disease, [ ]  on HD - [ ]  MWF or [ ]  TTHS, [ ]  Burning with urination, [ ]  Frequent urination, [ ]  Difficulty urinating;   Skin: No rashes  Psychological: No history of anxiety,  No history of  depression   Physical Examination  Vitals:   04/24/21 0853  BP: 132/64  Pulse: 64  Resp: 20  Temp: 98 F (36.7 C)  SpO2: 98%  Weight: 150 lb (68 kg)  Height: 5\' 8"  (1.727 m)    Body mass index is 22.81 kg/m.  General:  Alert and oriented, no acute distress HEENT: Normal Neck: No bruit or JVD Pulmonary: inspiratory/ext wheezing to auscultation bilaterally Cardiac: Regular Rate and Rhythm without murmur Gastrointestinal: Soft, non-tender, non-distended, no mass, no scars Skin: No rash Feet warm and well perfused, no ischemic skin changes. Musculoskeletal: No deformity or edema  Neurologic: Upper and lower extremity motor 5/5 and symmetric  DATA:  Abdominal Aorta Findings:  +-----------+-------+----------+----------+--------+--------+--------+   Location    AP (cm) Trans (cm) PSV (cm/s) Waveform Thrombus Comments   +-----------+-------+----------+----------+--------+--------+--------+   Proximal    2.01    1.92       40                                      +-----------+-------+----------+----------+--------+--------+--------+   Mid         2.76    2.92       45                                      +-----------+-------+----------+----------+--------+--------+--------+   Distal      4.82    4.60       53                                      +-----------+-------+----------+----------+--------+--------+--------+   RT CIA Prox 1.3     1.2        103                                     +-----------+-------+----------+----------+--------+--------+--------+   LT CIA Prox 1.0     1.0        106                                     +-----------+-------+----------+----------+--------+--------+--------+  Summary:  Abdominal Aorta: There is evidence of abnormal dilatation of the distal  Abdominal aorta. The largest aortic measurement is 4.8 cm. The largest  aortic diameter has increased compared to prior exam. Previous diameter  measurement was 4.7 cm obtained on   09/26/2020.   ASSESSMENT/PLAN: Asymptomatic AAA with duplex demonstrating largest diameter of 4.82 cm  This is slightly increase from 6 months ago, but still not significant to require CTA at this time.  He will f/u in 6 months for repeat duplex.  We reviewed symptoms of AAA with rupture causing sever abdominal/lumbar pain he should call 911.        Roxy Horseman PA-C [facility] [place] Office: [phone]  MD in clinic [place]

## 2021-04-26 ENCOUNTER — Other Ambulatory Visit: Payer: Self-pay | Admitting: *Deleted

## 2021-04-26 DIAGNOSIS — I714 Abdominal aortic aneurysm, without rupture, unspecified: Secondary | ICD-10-CM

## 2021-04-28 ENCOUNTER — Other Ambulatory Visit: Payer: Self-pay | Admitting: Family Medicine

## 2021-04-28 DIAGNOSIS — F411 Generalized anxiety disorder: Secondary | ICD-10-CM

## 2021-04-28 DIAGNOSIS — F332 Major depressive disorder, recurrent severe without psychotic features: Secondary | ICD-10-CM

## 2021-05-02 ENCOUNTER — Other Ambulatory Visit: Payer: Self-pay | Admitting: Family Medicine

## 2021-05-02 DIAGNOSIS — I10 Essential (primary) hypertension: Secondary | ICD-10-CM

## 2021-05-12 ENCOUNTER — Other Ambulatory Visit: Payer: Self-pay | Admitting: Family Medicine

## 2021-05-12 DIAGNOSIS — R062 Wheezing: Secondary | ICD-10-CM

## 2021-05-24 ENCOUNTER — Ambulatory Visit (INDEPENDENT_AMBULATORY_CARE_PROVIDER_SITE_OTHER): Payer: Medicare Other

## 2021-05-24 VITALS — Wt 150.0 lb

## 2021-05-24 DIAGNOSIS — Z658 Other specified problems related to psychosocial circumstances: Secondary | ICD-10-CM | POA: Diagnosis not present

## 2021-05-24 DIAGNOSIS — Z599 Problem related to housing and economic circumstances, unspecified: Secondary | ICD-10-CM

## 2021-05-24 DIAGNOSIS — Z0000 Encounter for general adult medical examination without abnormal findings: Secondary | ICD-10-CM | POA: Diagnosis not present

## 2021-05-24 DIAGNOSIS — Z5941 Food insecurity: Secondary | ICD-10-CM

## 2021-05-24 NOTE — Progress Notes (Signed)
Subjective:   Theodore Mendoza is a 81 y.o. male who presents for Medicare Annual/Subsequent preventive examination.  Virtual Visit via Telephone Note  I connected with  Theodore Mendoza on 05/24/21 at  1:15 PM EST by telephone and verified that I am speaking with the correct person using two identifiers.  Location: Patient: Home Provider: [facility] Persons participating in the virtual visit: patient/Nurse Health Advisor   I discussed the limitations, risks, security and privacy concerns of performing an evaluation and management service by telephone and the availability of in person appointments. The patient expressed understanding and agreed to proceed.  Interactive audio and video telecommunications were attempted between this nurse and patient, however failed, due to patient having technical difficulties OR patient did not have access to video capability.  We continued and completed visit with audio only.  Some vital signs may be absent or patient reported.   Theodore Mendoza E Theodore Naser, LPN   Review of Systems     Cardiac Risk Factors include: advanced age (>14men, >74 women);sedentary lifestyle;male gender;dyslipidemia;hypertension;Other (see comment), Risk factor comments: atherosclerosis, A.Fib, Paraplegia, Guillain Barre syndrome, hx of stroke     Objective:    Today's Vitals   05/24/21 1331  Weight: 150 lb (68 kg)   Body mass index is 22.81 kg/m.  Advanced Directives 05/24/2021 10/25/2020 10/21/2020 09/02/2020 08/11/2020 08/11/2020 05/23/2020  Does Patient Have a Medical Advance Directive? Yes No No No No No No  Type of Advance Directive Gans in Chart? No - copy requested - - - - - -  Would patient like information on creating a medical advance directive? - No - Patient declined No - Patient declined No - Patient declined No - Patient declined No - Patient declined No - Patient declined    Current Medications  (verified) Outpatient Encounter Medications as of 05/24/2021  Medication Sig   albuterol (VENTOLIN HFA) 108 (90 Base) MCG/ACT inhaler INHALE 2 PUFFS INTO THE LUNGS EVERY 6 HOURS AS NEEDED FOR WHEEZING OR SHORTNESS OF BREATH   amLODipine (NORVASC) 5 MG tablet TAKE 1 TABLET DAILY   apixaban (ELIQUIS) 5 MG TABS tablet Take 1 tablet (5 mg total) by mouth 2 (two) times daily.   dofetilide (TIKOSYN) 500 MCG capsule Take 1 capsule (500 mcg total) by mouth 2 (two) times daily.   escitalopram (LEXAPRO) 20 MG tablet TAKE ONE TABLET ONCE DAILY   Ferrous Sulfate (IRON) 325 (65 Fe) MG TABS Take 1 tablet (325 mg total) by mouth daily.   furosemide (LASIX) 20 MG tablet Take 1 tablet (20 mg total) by mouth daily.   gabapentin (NEURONTIN) 300 MG capsule Take 300 mg by mouth three times daily   lisinopril (ZESTRIL) 5 MG tablet TAKE 1 TABLET ONCE DAILY   mirtazapine (REMERON) 7.5 MG tablet TAKE ONE TABLET AT BEDTIME   pantoprazole (PROTONIX) 40 MG tablet Take 1 tablet (40 mg total) by mouth 2 (two) times daily.   pravastatin (PRAVACHOL) 40 MG tablet Take 1 tablet (40 mg total) by mouth daily.   sucralfate (CARAFATE) 1 g tablet Take 1 tablet (1 g total) by mouth 2 (two) times daily.   tamsulosin (FLOMAX) 0.4 MG CAPS capsule Take 1 capsule (0.4 mg total) by mouth daily.   vitamin B-12 (CYANOCOBALAMIN) 1000 MCG tablet Take 1 tablet (1,000 mcg total) by mouth daily.   No facility-administered encounter medications on file as of 05/24/2021.    Allergies (verified)  Hydrocodone-acetaminophen, Morphine and related, and Valsartan   History: Past Medical History:  Diagnosis Date   AAA (abdominal aortic aneurysm)    Brain tumor (HCC)    x2   Cardiac arrhythmia    CHF (congestive heart failure) ([place])    Guillain-Barre syndrome ([place]) Feb 13 1986   Hypertension    Kidney stones    Myocardial infarction ([place]) 2007   Past Surgical History:  Procedure Laterality Date   COLONOSCOPY WITH PROPOFOL N/A 08/14/2020    Procedure: COLONOSCOPY WITH PROPOFOL;  Surgeon: Rogene Houston, MD;  [facility];  Service: Endoscopy;  Laterality: N/A;   CORONARY ANGIOPLASTY  1997   after mi   CYSTOSCOPY WITH RETROGRADE PYELOGRAM, URETEROSCOPY AND STENT PLACEMENT Left 06/28/2014   Procedure: 1ST STAGE CYSTOSCOPY/URETEROSCOPY/STENT PLACEMENT;  Surgeon: Alexis Frock, MD;  [facility];  Service: Urology;  Laterality: Left;   CYSTOSCOPY WITH STENT PLACEMENT Left 05/08/2014   Procedure: CYSTOSCOPY, RETROGRADE PYELOGRAM WITH LEFT URETERAL STENT PLACEMENT;  Surgeon: Alexis Frock, MD;  [facility];  Service: Urology;  Laterality: Left;   ENTEROSCOPY N/A 09/16/2020   Procedure: PUSH ENTEROSCOPY;  Surgeon: Harvel Quale, MD;  [facility];  Service: Gastroenterology;  Laterality: N/A;   ENTEROSCOPY N/A 10/25/2020   Procedure: PUSH ENTEROSCOPY;  Surgeon: Harvel Quale, MD;  [facility];  Service: Gastroenterology;  Laterality: N/A;   ESOPHAGOGASTRODUODENOSCOPY (EGD) WITH PROPOFOL N/A 12/16/2019   Procedure: ESOPHAGOGASTRODUODENOSCOPY (EGD) WITH PROPOFOL;  Surgeon: Mauri Pole, MD;  [facility];  Service: Endoscopy;  Laterality: N/A;   ESOPHAGOGASTRODUODENOSCOPY (EGD) WITH PROPOFOL N/A 08/13/2020   Procedure: ESOPHAGOGASTRODUODENOSCOPY (EGD) WITH PROPOFOL;  Surgeon: Rogene Houston, MD;  [facility];  Service: Endoscopy;  Laterality: N/A;   ESOPHAGOGASTRODUODENOSCOPY (EGD) WITH PROPOFOL N/A 09/16/2020   Procedure: ESOPHAGOGASTRODUODENOSCOPY (EGD) WITH PROPOFOL;  Surgeon: Harvel Quale, MD;  [facility];  Service: Gastroenterology;  Laterality: N/A;  1:55   ESOPHAGOGASTRODUODENOSCOPY (EGD) WITH PROPOFOL N/A 10/25/2020   Procedure: ESOPHAGOGASTRODUODENOSCOPY (EGD) WITH PROPOFOL;  Surgeon: Harvel Quale, MD;  [facility];  Service: Gastroenterology;  Laterality: N/A;  2:00   EYE SURGERY Right may  2015   growth removed, july 2015left eye cataract removed, right eye catarct removed   gamma kniferadiation treatment  Feb 09 2014   [facility]   GIVENS CAPSULE STUDY N/A 10/11/2020   Procedure: GIVENS CAPSULE STUDY;  Surgeon: Harvel Quale, MD;  [facility];  Service: Gastroenterology;  Laterality: N/A;  7:30   HEMOSTASIS CLIP PLACEMENT  12/16/2019   Procedure: HEMOSTASIS CLIP PLACEMENT;  Surgeon: Mauri Pole, MD;  [facility];  Service: Endoscopy;;   HEMOSTASIS CONTROL  12/16/2019   Procedure: HEMOSTASIS CONTROL;  Surgeon: Mauri Pole, MD;  [facility];  Service: Endoscopy;;  Endoloop   HOLMIUM LASER APPLICATION Left 9/41/7408   Procedure: HOLMIUM LASER APPLICATION;  Surgeon: Alexis Frock, MD;  [facility];  Service: Urology;  Laterality: Left;   LITHOTRIPSY  years ago   POLYPECTOMY  12/16/2019   Procedure: POLYPECTOMY;  Surgeon: Mauri Pole, MD;  [facility];  Service: Endoscopy;;   STONE EXTRACTION WITH BASKET Left 06/28/2014   Procedure: STONE EXTRACTION WITH BASKET;  Surgeon: Alexis Frock, MD;  [facility];  Service: Urology;  Laterality: Left;   Family History  Problem Relation Age of Onset   Diabetes Mother    Lung cancer Mother    CAD  Father    Diabetes Brother    Diabetes Sister    Stroke Sister    Other Maternal Grandfather        brain tumor   Colon cancer Neg Hx    Pancreatic cancer Neg Hx    Esophageal cancer Neg Hx    Social History   Socioeconomic History   Marital status: Married    Spouse name: Enid Derry   Number of children: 1   Years of education: Not on file   Highest education level: Not on file  Occupational History   Occupation: retired   Tobacco Use   Smoking status: Never   Smokeless tobacco: Never  Vaping Use   Vaping Use: Never used  Substance and Sexual Activity   Alcohol use: No   Drug use: No   Sexual activity: Not Currently  Other  Topics Concern   Not on file  Social History Narrative   Lives with wife - they have a basement but don't use it   One son - he's a Environmental education officer and they go to his church   Patient is paraplegic - uses wheelchair, sometimes motorized   He does still drive using hand controls.   Social Determinants of Health   Financial Resource Strain: Medium Risk   Difficulty of Paying Living Expenses: Somewhat hard  Food Insecurity: Food Insecurity Present   Worried About Running Out of Food in the Last Year: Sometimes true   Ran Out of Food in the Last Year: Never true  Transportation Needs: No Transportation Needs   Lack of Transportation (Medical): No   Lack of Transportation (Non-Medical): No  Physical Activity: Insufficiently Active   Days of Exercise per Week: 7 days   Minutes of Exercise per Session: 20 min  Stress: No Stress Concern Present   Feeling of Stress : Not at all  Social Connections: Socially Integrated   Frequency of Communication with Friends and Family: More than three times a week   Frequency of Social Gatherings with Friends and Family: More than three times a week   Attends Religious Services: More than 4 times per year   Active Member of [employer] or Organizations: Yes   Attends Music therapist: More than 4 times per year   Marital Status: Married    Tobacco Counseling Counseling given: Not Answered   Clinical Intake:  Pre-visit preparation completed: Yes  Pain : No/denies pain     BMI - recorded: 22.81 Nutritional Status: BMI of 19-24  Normal Nutritional Risks: None Diabetes: No  How often do you need to have someone help you when you read instructions, pamphlets, or other written materials from your doctor or pharmacy?: 1 - Never  Diabetic? no  Interpreter Needed?: No  Information entered by :: Eshaal Duby, LPN   Activities of Daily Living In your present state of health, do you have any difficulty performing the following activities:  05/24/2021 10/21/2020  Hearing? N N  Vision? N N  Difficulty concentrating or making decisions? N N  Walking or climbing stairs? Y N  Dressing or bathing? Y N  Comment hand weakness - has a hard time with buttons -  Doing errands, shopping? N N  Preparing Food and eating ? N -  Using the Toilet? N -  In the past six months, have you accidently leaked urine? N -  Do you have problems with loss of bowel control? N -  Managing your Medications? N -  Managing your Finances? N -  Housekeeping or managing your Housekeeping? Y -  Some recent data might be hidden    Patient Care Team: Loman Brooklyn, FNP as PCP - General (Family Medicine) Loretta Plume as Consulting Physician (Neurosurgery) Alexis Frock, MD as Consulting Physician (Urology) Lavera Guise, [facility] (Pharmacist) Celestia Khat, OD (Optometry)  Indicate any recent Medical Services you may have received from other than [facility] providers in the past year (date may be approximate).     Assessment:   This is a routine wellness examination for Argie.  Hearing/Vision screen Hearing Screening - Comments:: Denies hearing difficulties    Vision Screening - Comments:: Wears rx glasses - up to date with routine eye exams with [facility]  Dietary issues and exercise activities discussed: Current Exercise Habits: Home exercise routine, Type of exercise: strength training/weights;stretching;Other - see comments (stationary hand cycle he uses for upper body strength), Time (Minutes): 20, Frequency (Times/Week): 7, Weekly Exercise (Minutes/Week): 140, Intensity: Mild, Exercise limited by: neurologic condition(s);orthopedic condition(s);psychological condition(s)   Goals Addressed   None    Depression Screen PHQ 2/9 Scores 05/24/2021 02/01/2021 11/01/2020 09/01/2020 08/23/2020 05/23/2020 05/06/2020  PHQ - 2 Score 2 3 1 5 1 1 1   PHQ- 9 Score 8 17 8 21 9  - -    Fall Risk Fall Risk  05/24/2021 02/01/2021 11/01/2020 09/01/2020 08/23/2020  Falls  in the past year? 0 1 1 1 1   Number falls in past yr: 0 0 0 0 0  Injury with Fall? 0 0 1 1 1   Risk for fall due to : Orthopedic patient;Impaired mobility;Other (Comment) - - - -  Risk for fall due to: Comment hx of TIA, Guillain Barre - - - -  Follow up Falls prevention discussed;Education provided Falls prevention discussed Falls prevention discussed Falls prevention discussed Falls prevention discussed    FALL RISK PREVENTION PERTAINING TO THE HOME:  Any stairs in or around the home? Yes  If so, are there any without handrails? No  Home free of loose throw rugs in walkways, pet beds, electrical cords, etc? Yes  Adequate lighting in your home to reduce risk of falls? Yes   ASSISTIVE DEVICES UTILIZED TO PREVENT FALLS:  Life alert? No  Use of a cane, walker or w/c? Yes  Grab bars in the bathroom? Yes  Shower chair or bench in shower? Yes  Elevated toilet seat or a handicapped toilet? Yes   TIMED UP AND GO:  Was the test performed? No . Telephonic visit  Cognitive Function: Normal cognitive status assessed by direct observation by this Nurse Health Advisor. No abnormalities found.      6CIT Screen 05/23/2020 05/19/2019  What Year? 0 points 0 points  What month? 0 points 0 points  What time? 0 points 0 points  Count back from 20 0 points 0 points  Months in reverse 0 points 0 points  Repeat phrase 2 points 2 points  Total Score 2 2    Immunizations Immunization History  Administered Date(s) Administered   Pneumococcal Conjugate-13 01/15/2019   Pneumococcal Polysaccharide-23 02/03/2020    TDAP status: Due, Education has been provided regarding the importance of this vaccine. Advised may receive this vaccine at local pharmacy or Health Dept. Aware to provide a copy of the vaccination record if obtained from local pharmacy or Health Dept. Verbalized acceptance and understanding.  Flu Vaccine status: Declined, Education has been provided regarding the importance of this  vaccine but patient still declined. Advised may receive this vaccine at local pharmacy or  Health Dept. Aware to provide a copy of the vaccination record if obtained from local pharmacy or Health Dept. Verbalized acceptance and understanding.  Pneumococcal vaccine status: Up to date  Covid-19 vaccine status: Declined, Education has been provided regarding the importance of this vaccine but patient still declined. Advised may receive this vaccine at local pharmacy or Health Dept.or vaccine clinic. Aware to provide a copy of the vaccination record if obtained from local pharmacy or Health Dept. Verbalized acceptance and understanding.  Qualifies for Shingles Vaccine? No   Zostavax completed No   Shingrix Completed?: No.    Education has been provided regarding the importance of this vaccine. Patient has been advised to call insurance company to determine out of pocket expense if they have not yet received this vaccine. Advised may also receive vaccine at local pharmacy or Health Dept. Verbalized acceptance and understanding.  Screening Tests Health Maintenance  Topic Date Due   INFLUENZA VACCINE  06/30/2021 (Originally 10/31/2020)   Pneumonia Vaccine 37+ Years old  Completed   HPV VACCINES  Aged Out   TETANUS/TDAP  Discontinued   COVID-19 Vaccine  Discontinued   Zoster Vaccines- Shingrix  Discontinued    Health Maintenance  There are no preventive care reminders to display for this patient.  Colorectal cancer screening: No longer required.   Lung Cancer Screening: (Low Dose CT Chest recommended if Age 30-80 years, 30 pack-year currently smoking OR have quit w/in 15years.) does not qualify  Additional Screening:  Hepatitis C Screening: does not qualify  Vision Screening: Recommended annual ophthalmology exams for early detection of glaucoma and other disorders of the eye. Is the patient up to date with their annual eye exam?  No  Who is the provider or what is the name of the office in  which the patient attends annual eye exams? [place] If pt is not established with a provider, would they like to be referred to a provider to establish care? No .   Dental Screening: Recommended annual dental exams for proper oral hygiene  Community Resource Referral / Chronic Care Management: CRR required this visit?  Yes   CCM required this visit?  Yes      Plan:     I have personally reviewed and noted the following in the patients chart:   Medical and social history Use of alcohol, tobacco or illicit drugs  Current medications and supplements including opioid prescriptions. Patient is not currently taking opioid prescriptions. Functional ability and status Nutritional status Physical activity Advanced directives List of other physicians Hospitalizations, surgeries, and ER visits in previous 12 months Vitals Screenings to include cognitive, depression, and falls Referrals and appointments  In addition, I have reviewed and discussed with patient certain preventive protocols, quality metrics, and best practice recommendations. A written personalized care plan for preventive services as well as general preventive health recommendations were provided to patient.     Sandrea Hammond, LPN   11/[birth year]/32   Nurse Notes: They are struggling financially to get basic needs such as food, medication and utilities. CRR and CCM referrals sent for assistance.

## 2021-05-24 NOTE — Patient Instructions (Signed)
Theodore Mendoza , Thank you for taking time to come for your Medicare Wellness Visit. I appreciate your ongoing commitment to your health goals. Please review the following plan we discussed and let me know if I can assist you in the future.   Screening recommendations/referrals: Colonoscopy: Done 08/14/2020 - no repeat required Recommended yearly ophthalmology/optometry visit for glaucoma screening and checkup Recommended yearly dental visit for hygiene and checkup  Vaccinations: Influenza vaccine: Declined Pneumococcal vaccine: Done 01/15/2019 & 02/03/2020 Tdap vaccine: Declined Shingles vaccine: Declined   Covid-19: Declined  Advanced directives: Please bring a copy of your health care power of attorney and living will to the office to be added to your chart at your convenience.   Conditions/risks identified: Aim for 30 minutes of exercise each day, drink 6-8 glasses of water and eat lots of fruits and vegetables.   Next appointment: Follow up in one year for your annual wellness visit.   Preventive Care 25 Years and Older, Male  Preventive care refers to lifestyle choices and visits with your health care provider that can promote health and wellness. What does preventive care include? A yearly physical exam. This is also called an annual well check. Dental exams once or twice a year. Routine eye exams. Ask your health care provider how often you should have your eyes checked. Personal lifestyle choices, including: Daily care of your teeth and gums. Regular physical activity. Eating a healthy diet. Avoiding tobacco and drug use. Limiting alcohol use. Practicing safe sex. Taking low doses of aspirin every day. Taking vitamin and mineral supplements as recommended by your health care provider. What happens during an annual well check? The services and screenings done by your health care provider during your annual well check will depend on your age, overall health, lifestyle risk  factors, and family history of disease. Counseling  Your health care provider may ask you questions about your: Alcohol use. Tobacco use. Drug use. Emotional well-being. Home and relationship well-being. Sexual activity. Eating habits. History of falls. Memory and ability to understand (cognition). Work and work Statistician. Screening  You may have the following tests or measurements: Height, weight, and BMI. Blood pressure. Lipid and cholesterol levels. These may be checked every 5 years, or more frequently if you are over 24 years old. Skin check. Lung cancer screening. You may have this screening every year starting at age 89 if you have a 30-pack-year history of smoking and currently smoke or have quit within the past 15 years. Fecal occult blood test (FOBT) of the stool. You may have this test every year starting at age 36. Flexible sigmoidoscopy or colonoscopy. You may have a sigmoidoscopy every 5 years or a colonoscopy every 10 years starting at age 50. Prostate cancer screening. Recommendations will vary depending on your family history and other risks. Hepatitis C blood test. Hepatitis B blood test. Sexually transmitted disease (STD) testing. Diabetes screening. This is done by checking your blood sugar (glucose) after you have not eaten for a while (fasting). You may have this done every 1-3 years. Abdominal aortic aneurysm (AAA) screening. You may need this if you are a current or former smoker. Osteoporosis. You may be screened starting at age 65 if you are at high risk. Talk with your health care provider about your test results, treatment options, and if necessary, the need for more tests. Vaccines  Your health care provider may recommend certain vaccines, such as: Influenza vaccine. This is recommended every year. Tetanus, diphtheria, and acellular pertussis (Tdap,  Td) vaccine. You may need a Td booster every 10 years. Zoster vaccine. You may need this after age  7. Pneumococcal 13-valent conjugate (PCV13) vaccine. One dose is recommended after age 7. Pneumococcal polysaccharide (PPSV23) vaccine. One dose is recommended after age 88. Talk to your health care provider about which screenings and vaccines you need and how often you need them. This information is not intended to replace advice given to you by your health care provider. Make sure you discuss any questions you have with your health care provider. Document Released: 04/15/2015 Document Revised: 12/07/2015 Document Reviewed: 01/18/2015 Elsevier Interactive Patient Education  2017 [place] Prevention in the Home Falls can cause injuries. They can happen to people of all ages. There are many things you can do to make your home safe and to help prevent falls. What can I do on the outside of my home? Regularly fix the edges of walkways and driveways and fix any cracks. Remove anything that might make you trip as you walk through a door, such as a raised step or threshold. Trim any bushes or trees on the path to your home. Use bright outdoor lighting. Clear any walking paths of anything that might make someone trip, such as rocks or tools. Regularly check to see if handrails are loose or broken. Make sure that both sides of any steps have handrails. Any raised decks and porches should have guardrails on the edges. Have any leaves, snow, or ice cleared regularly. Use sand or salt on walking paths during winter. Clean up any spills in your garage right away. This includes oil or grease spills. What can I do in the bathroom? Use night lights. Install grab bars by the toilet and in the tub and shower. Do not use towel bars as grab bars. Use non-skid mats or decals in the tub or shower. If you need to sit down in the shower, use a plastic, non-slip stool. Keep the floor dry. Clean up any water that spills on the floor as soon as it happens. Remove soap buildup in the tub or shower  regularly. Attach bath mats securely with double-sided non-slip rug tape. Do not have throw rugs and other things on the floor that can make you trip. What can I do in the bedroom? Use night lights. Make sure that you have a light by your bed that is easy to reach. Do not use any sheets or blankets that are too big for your bed. They should not hang down onto the floor. Have a firm chair that has side arms. You can use this for support while you get dressed. Do not have throw rugs and other things on the floor that can make you trip. What can I do in the kitchen? Clean up any spills right away. Avoid walking on wet floors. Keep items that you use a lot in easy-to-reach places. If you need to reach something above you, use a strong step stool that has a grab bar. Keep electrical cords out of the way. Do not use floor polish or wax that makes floors slippery. If you must use wax, use non-skid floor wax. Do not have throw rugs and other things on the floor that can make you trip. What can I do with my stairs? Do not leave any items on the stairs. Make sure that there are handrails on both sides of the stairs and use them. Fix handrails that are broken or loose. Make sure that handrails are as  long as the stairways. Check any carpeting to make sure that it is firmly attached to the stairs. Fix any carpet that is loose or worn. Avoid having throw rugs at the top or bottom of the stairs. If you do have throw rugs, attach them to the floor with carpet tape. Make sure that you have a light switch at the top of the stairs and the bottom of the stairs. If you do not have them, ask someone to add them for you. What else can I do to help prevent falls? Wear shoes that: Do not have high heels. Have rubber bottoms. Are comfortable and fit you well. Are closed at the toe. Do not wear sandals. If you use a stepladder: Make sure that it is fully opened. Do not climb a closed stepladder. Make sure that  both sides of the stepladder are locked into place. Ask someone to hold it for you, if possible. Clearly mark and make sure that you can see: Any grab bars or handrails. First and last steps. Where the edge of each step is. Use tools that help you move around (mobility aids) if they are needed. These include: Canes. Walkers. Scooters. Crutches. Turn on the lights when you go into a dark area. Replace any light bulbs as soon as they burn out. Set up your furniture so you have a clear path. Avoid moving your furniture around. If any of your floors are uneven, fix them. If there are any pets around you, be aware of where they are. Review your medicines with your doctor. Some medicines can make you feel dizzy. This can increase your chance of falling. Ask your doctor what other things that you can do to help prevent falls. This information is not intended to replace advice given to you by your health care provider. Make sure you discuss any questions you have with your health care provider. Document Released: 01/13/2009 Document Revised: 08/25/2015 Document Reviewed: 04/23/2014 Elsevier Interactive Patient Education  2017 [employer].

## 2021-05-26 ENCOUNTER — Other Ambulatory Visit: Payer: Self-pay | Admitting: Family Medicine

## 2021-05-26 DIAGNOSIS — I10 Essential (primary) hypertension: Secondary | ICD-10-CM

## 2021-05-29 DIAGNOSIS — I4819 Other persistent atrial fibrillation: Secondary | ICD-10-CM | POA: Diagnosis not present

## 2021-05-30 ENCOUNTER — Telehealth: Payer: Self-pay

## 2021-05-30 NOTE — Chronic Care Management (AMB) (Signed)
°  Chronic Care Management   Note  05/30/2021 Name: TAMOTSU WIEDERHOLT MRN: [id number] DOB: 06/09/1940  Karen Chafe Miu is a 81 y.o. year old male who is a primary care patient of Loman Brooklyn, FNP. Oluwasemilore L Ballinas is currently enrolled in care management services. An additional referral for Pharm D  was placed.   Follow up plan: Face to Face appointment with care management team member scheduled for: 07/06/2021  Noreene Larsson, [place], [place], [place] [address] Direct Dial: [phone] [email or web address]: [email or web address]

## 2021-05-30 NOTE — Telephone Encounter (Signed)
° °  Telephone encounter was:  Successful.  05/30/2021 Name: Theodore Mendoza MRN: [id number] DOB: 11/18/1940  Theodore Mendoza is a 81 y.o. year old male who is a primary care patient of Loman Brooklyn, FNP . The community resource team was consulted for assistance with  medication assistance  Care guide performed the following interventions: Patient provided with information about care guide support team and interviewed to confirm resource needs.Patient stated they didnt need food and utility assistance they only needed medication assistance  Follow Up Plan:  Care guide will follow up with patient by phone over the next [place], [place], Care Management  [phone] [address], [place], [place] [address] Phone: [phone] Email: [email or web address]

## 2021-06-02 ENCOUNTER — Encounter: Payer: Self-pay | Admitting: Family Medicine

## 2021-06-02 ENCOUNTER — Ambulatory Visit (INDEPENDENT_AMBULATORY_CARE_PROVIDER_SITE_OTHER): Payer: Medicare Other | Admitting: Family Medicine

## 2021-06-02 VITALS — BP 138/76 | HR 68 | Temp 97.7°F

## 2021-06-02 DIAGNOSIS — K219 Gastro-esophageal reflux disease without esophagitis: Secondary | ICD-10-CM

## 2021-06-02 DIAGNOSIS — F419 Anxiety disorder, unspecified: Secondary | ICD-10-CM

## 2021-06-02 DIAGNOSIS — G609 Hereditary and idiopathic neuropathy, unspecified: Secondary | ICD-10-CM

## 2021-06-02 DIAGNOSIS — I4891 Unspecified atrial fibrillation: Secondary | ICD-10-CM

## 2021-06-02 DIAGNOSIS — E782 Mixed hyperlipidemia: Secondary | ICD-10-CM

## 2021-06-02 DIAGNOSIS — R0681 Apnea, not elsewhere classified: Secondary | ICD-10-CM

## 2021-06-02 DIAGNOSIS — I10 Essential (primary) hypertension: Secondary | ICD-10-CM

## 2021-06-02 DIAGNOSIS — F411 Generalized anxiety disorder: Secondary | ICD-10-CM | POA: Diagnosis not present

## 2021-06-02 DIAGNOSIS — I252 Old myocardial infarction: Secondary | ICD-10-CM

## 2021-06-02 DIAGNOSIS — I714 Abdominal aortic aneurysm, without rupture, unspecified: Secondary | ICD-10-CM

## 2021-06-02 DIAGNOSIS — G610 Guillain-Barre syndrome: Secondary | ICD-10-CM | POA: Diagnosis not present

## 2021-06-02 DIAGNOSIS — F332 Major depressive disorder, recurrent severe without psychotic features: Secondary | ICD-10-CM | POA: Diagnosis not present

## 2021-06-02 DIAGNOSIS — G8222 Paraplegia, incomplete: Secondary | ICD-10-CM

## 2021-06-02 DIAGNOSIS — I4819 Other persistent atrial fibrillation: Secondary | ICD-10-CM

## 2021-06-02 DIAGNOSIS — Z8673 Personal history of transient ischemic attack (TIA), and cerebral infarction without residual deficits: Secondary | ICD-10-CM

## 2021-06-02 DIAGNOSIS — G4719 Other hypersomnia: Secondary | ICD-10-CM

## 2021-06-02 DIAGNOSIS — D329 Benign neoplasm of meninges, unspecified: Secondary | ICD-10-CM

## 2021-06-02 LAB — LIPID PANEL

## 2021-06-02 MED ORDER — APIXABAN 5 MG PO TABS: 5.0000 mg | ORAL_TABLET | Freq: Two times a day (BID) | ORAL | 3 refills | Status: DC

## 2021-06-02 MED ORDER — ESCITALOPRAM OXALATE 20 MG PO TABS: 20.0000 mg | ORAL_TABLET | Freq: Every day | ORAL | 1 refills | Status: DC

## 2021-06-02 MED ORDER — MIRTAZAPINE 7.5 MG PO TABS: 7.5000 mg | ORAL_TABLET | Freq: Every day | ORAL | 1 refills | Status: DC

## 2021-06-02 MED ORDER — SUCRALFATE 1 G PO TABS: 1.0000 g | ORAL_TABLET | Freq: Two times a day (BID) | ORAL | 1 refills | Status: DC

## 2021-06-02 NOTE — Progress Notes (Signed)
Assessment & Plan:  1. Generalized anxiety disorder Well controlled on current regimen.  - escitalopram (LEXAPRO) 20 MG tablet; Take 1 tablet (20 mg total) by mouth daily.  Dispense: 90 tablet; Refill: 1 - mirtazapine (REMERON) 7.5 MG tablet; Take 1 tablet (7.5 mg total) by mouth at bedtime.  Dispense: 90 tablet; Refill: 1 - CMP14+EGFR  2. Severe episode of recurrent major depressive disorder, without psychotic features ([place]) Well controlled on current regimen.  - escitalopram (LEXAPRO) 20 MG tablet; Take 1 tablet (20 mg total) by mouth daily.  Dispense: 90 tablet; Refill: 1 - mirtazapine (REMERON) 7.5 MG tablet; Take 1 tablet (7.5 mg total) by mouth at bedtime.  Dispense: 90 tablet; Refill: 1 - CMP14+EGFR  3. Gastroesophageal reflux disease, unspecified whether esophagitis present Well controlled on current regimen.  - sucralfate (CARAFATE) 1 g tablet; Take 1 tablet (1 g total) by mouth 2 (two) times daily.  Dispense: 180 tablet; Refill: 1 - CMP14+EGFR  4-5. GBS (Guillain Barre syndrome) (HCC)/Paraplegia, incomplete [facility]) Patient does well caring for himself.   6. Idiopathic peripheral neuropathy Well controlled on current regimen.  - CMP14+EGFR  7. Meningioma ([place]) Followed by neurosurgery who he does not have to see again until 2024.   8. Persistent atrial fibrillation (HCC) Well controlled on current regimen. Managed by cardiology. - apixaban (ELIQUIS) 5 MG TABS tablet; Take 1 tablet (5 mg total) by mouth 2 (two) times daily.  Dispense: 180 tablet; Refill: 3 - CBC with Differential/Platelet - CMP14+EGFR  9. Essential (primary) hypertension Well controlled on current regimen.  - CBC with Differential/Platelet - CMP14+EGFR - Lipid panel  10. History of non-ST elevation myocardial infarction (NSTEMI) Continue Pravastatin and Eliquis. - CMP14+EGFR - Lipid panel  11. History of TIA (transient ischemic attack) Continue Pravastatin and Eliquis. - CMP14+EGFR - Lipid  panel  12. Abdominal aortic aneurysm (AAA) without rupture, unspecified part Stable. Followed by vascular.  13. Mixed hyperlipidemia Well controlled on current regimen.  - CBC with Differential/Platelet - CMP14+EGFR - Lipid panel  14-15. Excessive daytime sleepiness/Apnea Home sleep study to be completed.   Return in about 4 months (around 10/02/2021) for follow-up of chronic medication conditions.  [name], MSN, APRN, FNP-C [facility]  Subjective:    Patient ID: Theodore Mendoza, male    DOB: 1940/09/11, 81 y.o.   MRN: [id number]  Patient Care Team: Loman Brooklyn, FNP as PCP - General (Family Medicine) Loretta Plume as Consulting Physician (Neurosurgery) Alexis Frock, MD as Consulting Physician (Urology) Lavera Guise, [facility] (Pharmacist) Celestia Khat, OD (Optometry)   Chief Complaint:  Chief Complaint  Patient presents with   Medical Management of Chronic Issues   sleeping alot     Patent states that he has been sleeping a lot x 1 year.  Wondering if it is from his medications?    HPI: Theodore Mendoza is a 81 y.o. male presenting on 06/02/2021 for Medical Management of Chronic Issues and sleeping alot  (Patent states that he has been sleeping a lot x 1 year.  Wondering if it is from his medications?)  A-Fib: managed by cardiology. Taking Tikosyn and Eliquis. Does not qualify for prescription assistance.   Hypertension: taking amlodipine 5 mg, Lisinopril 5 mg, and furosemide 20 mg.   Hyperlipidemia/History of NSTEMI/History of TIA: taking pravastatin daily.  Rosalee Kaufman Syndrome/Neuropathy: taking gabapentin.  AAA: Patient has seen vascular surgery who does not recommend surgery until he is at least 5 cm due to his multiple  comorbidities.  Last CT scan on 08/11/2020 showed the abdominal aorta measuring 4.7 cm.   GERD: taking pantoprazole.   Anxiety/Depression: patient feels like he is doing much better. He is still concerned that  he is sleeping all the time. His cardiologist has suggested an evaluation for OSA. His wife and his daughter are currently battling cancer. He has been taking Lexapro 20 mg once daily and Remeron 7.5 mg was added three months ago.   STOP-BANG: Height: 5' 8"   Weight: 150 lbs  BMI: 22.81  Neck circumference: 40 cm  Do you snore loudly (louder than talking or loud enough to be heard through closed doors)?  Yes Do you often feel tired, fatigued, or sleepy during daytime?  Yes Has anyone observed you stop breathing during your sleep?  Yes Do you have or are you being treated for high blood pressure?  Yes BMI more than 35 kg/m2?  No Age over 83 years?  Yes Neck circumference greater than 40 cm?  No Male gender?  Yes  Total "Yes" Answers: 6  Depression screen [facility] 2/9 06/02/2021 05/24/2021 02/01/2021  Decreased Interest 2 1 2   Down, Depressed, Hopeless 2 1 1   PHQ - 2 Score 4 2 3   Altered sleeping 3 1 1   Tired, decreased energy 3 2 3   Change in appetite 1 1 2   Feeling bad or failure about yourself  0 1 2  Trouble concentrating 0 1 3  Moving slowly or fidgety/restless 0 0 2  Suicidal thoughts 0 0 1  PHQ-9 Score 11 8 17   Difficult doing work/chores Not difficult at all Somewhat difficult Not difficult at all  Some recent data might be hidden   GAD 7 : Generalized Anxiety Score 06/02/2021 02/01/2021 11/01/2020 09/01/2020  Nervous, Anxious, on Edge 0 2 1 3   Control/stop worrying 1 2 1 2   Worry too much - different things 1 2 0 3  Trouble relaxing 0 3 1 2   Restless 0 2 2 2   Easily annoyed or irritable 1 1 2 3   Afraid - awful might happen 1 1 2 2   Total GAD 7 Score 4 13 9 17   Anxiety Difficulty Not difficult at all Somewhat difficult Somewhat difficult Somewhat difficult   Meningioma: Followed by neurosurgery.  He was last seen in June 2021 and they advised a follow-up in 3 years with a repeat brain MRI.  New complaints: None   Social history:  Relevant past medical, surgical,  family and social history reviewed and updated as indicated. Interim medical history since our last visit reviewed.  Allergies and medications reviewed and updated.  DATA REVIEWED: CHART IN EPIC  ROS: Negative unless specifically indicated above in HPI.    Current Outpatient Medications:    albuterol (VENTOLIN HFA) 108 (90 Base) MCG/ACT inhaler, INHALE 2 PUFFS INTO THE LUNGS EVERY 6 HOURS AS NEEDED FOR WHEEZING OR SHORTNESS OF BREATH, Disp: 8.5 g, Rfl: 1   amLODipine (NORVASC) 5 MG tablet, TAKE 1 TABLET DAILY, Disp: 90 tablet, Rfl: 0   apixaban (ELIQUIS) 5 MG TABS tablet, Take 1 tablet (5 mg total) by mouth 2 (two) times daily., Disp: 180 tablet, Rfl: 3   dofetilide (TIKOSYN) 500 MCG capsule, Take 1 capsule (500 mcg total) by mouth 2 (two) times daily., Disp: 180 capsule, Rfl: 3   escitalopram (LEXAPRO) 20 MG tablet, TAKE ONE TABLET ONCE DAILY, Disp: 30 tablet, Rfl: 3   Ferrous Sulfate (IRON) 325 (65 Fe) MG TABS, Take 1 tablet (325 mg total) by mouth  daily., Disp: 180 tablet, Rfl: 2   furosemide (LASIX) 20 MG tablet, Take 1 tablet (20 mg total) by mouth daily., Disp: 90 tablet, Rfl: 1   gabapentin (NEURONTIN) 300 MG capsule, Take 300 mg by mouth three times daily, Disp: 360 capsule, Rfl: 1   lisinopril (ZESTRIL) 5 MG tablet, TAKE 1 TABLET ONCE DAILY, Disp: 90 tablet, Rfl: 0   mirtazapine (REMERON) 7.5 MG tablet, TAKE ONE TABLET AT BEDTIME, Disp: 30 tablet, Rfl: 1   pantoprazole (PROTONIX) 40 MG tablet, Take 1 tablet (40 mg total) by mouth 2 (two) times daily., Disp: 180 tablet, Rfl: 1   pravastatin (PRAVACHOL) 40 MG tablet, Take 1 tablet (40 mg total) by mouth daily., Disp: 90 tablet, Rfl: 1   sucralfate (CARAFATE) 1 g tablet, Take 1 tablet (1 g total) by mouth 2 (two) times daily., Disp: 180 tablet, Rfl: 1   tamsulosin (FLOMAX) 0.4 MG CAPS capsule, Take 1 capsule (0.4 mg total) by mouth daily., Disp: 90 capsule, Rfl: 1   vitamin B-12 (CYANOCOBALAMIN) 1000 MCG tablet, Take 1 tablet (1,000 mcg  total) by mouth daily., Disp: 30 tablet, Rfl: 2   Allergies  Allergen Reactions   Hydrocodone-Acetaminophen     Passed out and got real weak   Morphine And Related Other (See Comments)    Reaction:  Confusion/weakness/hallucinations   Valsartan Rash   Past Medical History:  Diagnosis Date   AAA (abdominal aortic aneurysm)    Brain tumor (HCC)    x2   Cardiac arrhythmia    CHF (congestive heart failure) ([place])    Guillain-Barre syndrome ([place]) Feb 13 1986   Hypertension    Kidney stones    Myocardial infarction ([place]) 2007    Past Surgical History:  Procedure Laterality Date   COLONOSCOPY WITH PROPOFOL N/A 08/14/2020   Procedure: COLONOSCOPY WITH PROPOFOL;  Surgeon: Rogene Houston, MD;  [facility];  Service: Endoscopy;  Laterality: N/A;   CORONARY ANGIOPLASTY  1997   after mi   CYSTOSCOPY WITH RETROGRADE PYELOGRAM, URETEROSCOPY AND STENT PLACEMENT Left 06/28/2014   Procedure: 1ST STAGE CYSTOSCOPY/URETEROSCOPY/STENT PLACEMENT;  Surgeon: Alexis Frock, MD;  [facility]: [facility];  Service: Urology;  Laterality: Left;   CYSTOSCOPY WITH STENT PLACEMENT Left 05/08/2014   Procedure: CYSTOSCOPY, RETROGRADE PYELOGRAM WITH LEFT URETERAL STENT PLACEMENT;  Surgeon: Alexis Frock, MD;  [facility];  Service: Urology;  Laterality: Left;   ENTEROSCOPY N/A 09/16/2020   Procedure: PUSH ENTEROSCOPY;  Surgeon: Harvel Quale, MD;  [facility];  Service: Gastroenterology;  Laterality: N/A;   ENTEROSCOPY N/A 10/25/2020   Procedure: PUSH ENTEROSCOPY;  Surgeon: Harvel Quale, MD;  [facility];  Service: Gastroenterology;  Laterality: N/A;   ESOPHAGOGASTRODUODENOSCOPY (EGD) WITH PROPOFOL N/A 12/16/2019   Procedure: ESOPHAGOGASTRODUODENOSCOPY (EGD) WITH PROPOFOL;  Surgeon: Mauri Pole, MD;  [facility];  Service: Endoscopy;  Laterality: N/A;   ESOPHAGOGASTRODUODENOSCOPY (EGD) WITH PROPOFOL N/A 08/13/2020   Procedure:  ESOPHAGOGASTRODUODENOSCOPY (EGD) WITH PROPOFOL;  Surgeon: Rogene Houston, MD;  [facility];  Service: Endoscopy;  Laterality: N/A;   ESOPHAGOGASTRODUODENOSCOPY (EGD) WITH PROPOFOL N/A 09/16/2020   Procedure: ESOPHAGOGASTRODUODENOSCOPY (EGD) WITH PROPOFOL;  Surgeon: Harvel Quale, MD;  [facility];  Service: Gastroenterology;  Laterality: N/A;  1:55   ESOPHAGOGASTRODUODENOSCOPY (EGD) WITH PROPOFOL N/A 10/25/2020   Procedure: ESOPHAGOGASTRODUODENOSCOPY (EGD) WITH PROPOFOL;  Surgeon: Harvel Quale, MD;  [facility];  Service: Gastroenterology;  Laterality: N/A;  2:00   EYE SURGERY  Right may 2015   growth removed, july 2015left eye cataract removed, right eye catarct removed   gamma kniferadiation treatment  Feb 09 2014   [facility]   GIVENS CAPSULE STUDY N/A 10/11/2020   Procedure: GIVENS CAPSULE STUDY;  Surgeon: Harvel Quale, MD;  [facility];  Service: Gastroenterology;  Laterality: N/A;  7:30   HEMOSTASIS CLIP PLACEMENT  12/16/2019   Procedure: HEMOSTASIS CLIP PLACEMENT;  Surgeon: Mauri Pole, MD;  [facility];  Service: Endoscopy;;   HEMOSTASIS CONTROL  12/16/2019   Procedure: HEMOSTASIS CONTROL;  Surgeon: Mauri Pole, MD;  [facility];  Service: Endoscopy;;  Endoloop   HOLMIUM LASER APPLICATION Left 1/42/3953   Procedure: HOLMIUM LASER APPLICATION;  Surgeon: Alexis Frock, MD;  [facility];  Service: Urology;  Laterality: Left;   LITHOTRIPSY  years ago   POLYPECTOMY  12/16/2019   Procedure: POLYPECTOMY;  Surgeon: Mauri Pole, MD;  [facility];  Service: Endoscopy;;   STONE EXTRACTION WITH BASKET Left 06/28/2014   Procedure: STONE EXTRACTION WITH BASKET;  Surgeon: Alexis Frock, MD;  [facility];  Service: Urology;  Laterality: Left;    Social History   Socioeconomic History   Marital status: Married    Spouse name: Enid Derry    Number of children: 1   Years of education: Not on file   Highest education level: Not on file  Occupational History   Occupation: retired   Tobacco Use   Smoking status: Never   Smokeless tobacco: Never  Vaping Use   Vaping Use: Never used  Substance and Sexual Activity   Alcohol use: No   Drug use: No   Sexual activity: Not Currently  Other Topics Concern   Not on file  Social History Narrative   Lives with wife - they have a basement but don't use it   One son - he's a Environmental education officer and they go to his church   Patient is paraplegic - uses wheelchair, sometimes motorized   He does still drive using hand controls.   Social Determinants of Health   Financial Resource Strain: Medium Risk   Difficulty of Paying Living Expenses: Somewhat hard  Food Insecurity: Food Insecurity Present   Worried About Running Out of Food in the Last Year: Sometimes true   Ran Out of Food in the Last Year: Never true  Transportation Needs: No Transportation Needs   Lack of Transportation (Medical): No   Lack of Transportation (Non-Medical): No  Physical Activity: Insufficiently Active   Days of Exercise per Week: 7 days   Minutes of Exercise per Session: 20 min  Stress: No Stress Concern Present   Feeling of Stress : Not at all  Social Connections: Socially Integrated   Frequency of Communication with Friends and Family: More than three times a week   Frequency of Social Gatherings with Friends and Family: More than three times a week   Attends Religious Services: More than 4 times per year   Active Member of [employer] or Organizations: Yes   Attends Music therapist: More than 4 times per year   Marital Status: Married  Human resources officer Violence: Not At Risk   Fear of Current or Ex-Partner: No   Emotionally Abused: No   Physically Abused: No   Sexually Abused: No        Objective:    BP 138/76    Pulse 68    Temp 97.7 F (36.5 C) (Temporal)  SpO2 96%   Wt Readings from Last 3  Encounters:  05/24/21 150 lb (68 kg)  04/24/21 150 lb (68 kg)  10/21/20 150 lb (68 kg)    Physical Exam Vitals reviewed.  Constitutional:      General: He is not in acute distress.    Appearance: Normal appearance. He is overweight. He is not ill-appearing, toxic-appearing or diaphoretic.  HENT:     Head: Normocephalic and atraumatic.  Eyes:     General: No scleral icterus.       Right eye: No discharge.        Left eye: No discharge.     Conjunctiva/sclera: Conjunctivae normal.  Cardiovascular:     Rate and Rhythm: Regular rhythm.     Heart sounds: Normal heart sounds. No murmur heard.   No friction rub. No gallop.  Pulmonary:     Effort: Pulmonary effort is normal. No respiratory distress.     Breath sounds: Normal breath sounds. No stridor. No wheezing, rhonchi or rales.  Musculoskeletal:        General: Normal range of motion.     Cervical back: Normal range of motion.     Comments: Paralysis from the waist down.  Skin:    General: Skin is warm and dry.  Neurological:     Mental Status: He is alert and oriented to person, place, and time. Mental status is at baseline.     Gait: Gait abnormal (rides in [facility]).  Psychiatric:        Mood and Affect: Mood normal.        Behavior: Behavior normal.        Thought Content: Thought content normal.        Judgment: Judgment normal.    Lab Results  Component Value Date   TSH 3.590 05/06/2020   Lab Results  Component Value Date   WBC 5.7 02/01/2021   HGB 11.0 (L) 02/01/2021   HCT 35.0 (L) 02/01/2021   MCV 90 02/01/2021   PLT 205 02/01/2021   Lab Results  Component Value Date   NA 144 02/01/2021   K 4.7 02/01/2021   CO2 28 02/01/2021   GLUCOSE 96 02/01/2021   BUN 16 02/01/2021   CREATININE 0.82 02/01/2021   BILITOT 0.4 02/01/2021   ALKPHOS 66 02/01/2021   AST 13 02/01/2021   ALT 6 02/01/2021   PROT 5.7 (L) 02/01/2021   ALBUMIN 3.8 02/01/2021   CALCIUM 9.2 02/01/2021   ANIONGAP 5 10/21/2020   EGFR 88  02/01/2021   Lab Results  Component Value Date   CHOL 107 02/01/2021   Lab Results  Component Value Date   HDL 48 02/01/2021   Lab Results  Component Value Date   LDLCALC 47 02/01/2021   Lab Results  Component Value Date   TRIG 52 02/01/2021   Lab Results  Component Value Date   CHOLHDL 2.2 02/01/2021   Lab Results  Component Value Date   HGBA1C 4.7 05/06/2020

## 2021-06-03 LAB — CMP14+EGFR
ALT: 9 IU/L (ref 0–44)
AST: 13 IU/L (ref 0–40)
Albumin/Globulin Ratio: 2.5 — ABNORMAL HIGH (ref 1.2–2.2)
Albumin: 4 g/dL (ref 3.6–4.6)
Alkaline Phosphatase: 66 IU/L (ref 44–121)
BUN/Creatinine Ratio: 19 (ref 10–24)
BUN: 14 mg/dL (ref 8–27)
Bilirubin Total: 0.5 mg/dL (ref 0.0–1.2)
CO2: 28 mmol/L (ref 20–29)
Calcium: 9.1 mg/dL (ref 8.6–10.2)
Chloride: 102 mmol/L (ref 96–106)
Creatinine, Ser: 0.72 mg/dL — ABNORMAL LOW (ref 0.76–1.27)
Globulin, Total: 1.6 g/dL (ref 1.5–4.5)
Glucose: 86 mg/dL (ref 70–99)
Potassium: 4.2 mmol/L (ref 3.5–5.2)
Sodium: 146 mmol/L — ABNORMAL HIGH (ref 134–144)
Total Protein: 5.6 g/dL — ABNORMAL LOW (ref 6.0–8.5)
eGFR: 91 mL/min/{1.73_m2} (ref >59)

## 2021-06-03 LAB — CBC WITH DIFFERENTIAL/PLATELET
Basophils Absolute: 0.1 10*3/uL (ref 0.0–0.2)
Basos: 2 %
EOS (ABSOLUTE): 0.3 10*3/uL (ref 0.0–0.4)
Eos: 6 %
Hematocrit: 38.3 % (ref 37.5–51.0)
Hemoglobin: 12.5 g/dL — ABNORMAL LOW (ref 13.0–17.7)
Immature Grans (Abs): 0 10*3/uL (ref 0.0–0.1)
Immature Granulocytes: 0 %
Lymphocytes Absolute: 1.2 10*3/uL (ref 0.7–3.1)
Lymphs: 22 %
MCH: 28.8 pg (ref 26.6–33.0)
MCHC: 32.6 g/dL (ref 31.5–35.7)
MCV: 88 fL (ref 79–97)
Monocytes Absolute: 0.3 10*3/uL (ref 0.1–0.9)
Monocytes: 5 %
Neutrophils Absolute: 3.5 10*3/uL (ref 1.4–7.0)
Neutrophils: 65 %
Platelets: 169 10*3/uL (ref 150–450)
RBC: 4.34 x10E6/uL (ref 4.14–5.80)
RDW: 12.8 % (ref 11.6–15.4)
WBC: 5.4 10*3/uL (ref 3.4–10.8)

## 2021-06-03 LAB — LIPID PANEL
Chol/HDL Ratio: 2.6 ratio (ref 0.0–5.0)
Cholesterol, Total: 126 mg/dL (ref 100–199)
HDL: 49 mg/dL (ref >39)
LDL Chol Calc (NIH): 66 mg/dL (ref 0–99)
Triglycerides: 48 mg/dL (ref 0–149)
VLDL Cholesterol Cal: 11 mg/dL (ref 5–40)

## 2021-06-06 ENCOUNTER — Encounter: Payer: Self-pay | Admitting: Family Medicine

## 2021-06-16 ENCOUNTER — Telehealth: Payer: Self-pay | Admitting: Family Medicine

## 2021-06-19 NOTE — Telephone Encounter (Signed)
Patient said they had called to get his insurance information and he was able to give that to them.  They called back and told him it was going to cost $180 for the sleep study, he gave them his information but then called the bank and cancelled the transaction.  He said he does not want to do the sleep study. ?

## 2021-06-19 NOTE — Telephone Encounter (Signed)
Did they leave a message or can he call the # back they called from? It is [employer]; it is a home sleep study that I ordered. There was not a referral placed.  ?

## 2021-06-20 DIAGNOSIS — K529 Noninfective gastroenteritis and colitis, unspecified: Secondary | ICD-10-CM | POA: Diagnosis not present

## 2021-06-20 DIAGNOSIS — R110 Nausea: Secondary | ICD-10-CM | POA: Diagnosis not present

## 2021-06-20 NOTE — Telephone Encounter (Signed)
[email or web address]

## 2021-06-20 NOTE — Telephone Encounter (Signed)
Patient's wife calling back. Please return call.  

## 2021-06-20 NOTE — Telephone Encounter (Signed)
I hate to hear that. I do believe this is a cheaper option than going to see a pulmonologist and having it completed in the hospital, but I understand if he does not wish to pay for it.  ?

## 2021-06-26 NOTE — Telephone Encounter (Signed)
Patient aware and verbalizes understanding. 

## 2021-07-06 ENCOUNTER — Ambulatory Visit: Payer: Medicare Other

## 2021-07-11 DIAGNOSIS — L570 Actinic keratosis: Secondary | ICD-10-CM | POA: Diagnosis not present

## 2021-07-11 DIAGNOSIS — Z1283 Encounter for screening for malignant neoplasm of skin: Secondary | ICD-10-CM | POA: Diagnosis not present

## 2021-07-11 DIAGNOSIS — Z85828 Personal history of other malignant neoplasm of skin: Secondary | ICD-10-CM | POA: Diagnosis not present

## 2021-07-14 ENCOUNTER — Other Ambulatory Visit: Payer: Self-pay | Admitting: Family Medicine

## 2021-07-14 DIAGNOSIS — R062 Wheezing: Secondary | ICD-10-CM

## 2021-07-28 ENCOUNTER — Ambulatory Visit (INDEPENDENT_AMBULATORY_CARE_PROVIDER_SITE_OTHER): Payer: Medicare Other | Admitting: Pharmacist

## 2021-07-28 DIAGNOSIS — G8222 Paraplegia, incomplete: Secondary | ICD-10-CM

## 2021-07-28 DIAGNOSIS — E782 Mixed hyperlipidemia: Secondary | ICD-10-CM

## 2021-07-28 DIAGNOSIS — I4819 Other persistent atrial fibrillation: Secondary | ICD-10-CM

## 2021-07-30 DIAGNOSIS — I4819 Other persistent atrial fibrillation: Secondary | ICD-10-CM

## 2021-07-30 DIAGNOSIS — E782 Mixed hyperlipidemia: Secondary | ICD-10-CM

## 2021-07-30 NOTE — Progress Notes (Signed)
? ? ?Chronic Care Management ?Pharmacy Note ? ?07/28/2021 ?Name:  Theodore Mendoza MRN:  [id number] DOB:  Aug 20, 1940 ? ?Summary: ? ?Atrial Fibrillation: ?Controlled/challenging for patient to afford ?Current rate/rhythm control: TIKOSYN; anticoagulant treatment: ELIQUIS ?Paroxysmal AF on Eliquis ?-CHADS2-VASc Score is 28 (age, HTN, vascular disease, hx of prior CVA) ?Home blood pressure, heart rate readings:  ?Assessed patient finances.   ?TIKOSYN GENERIC NOW $75 PER MONTH on cash ?NO PAP FOR TIKOSYN BUT MAY BE ABLE TO DO GOOD RX AT [facility] FOR $45 ?CHECKING ON MEDICARE RULES ?ELIQUIS PAP ONLY WHEN PATIENT AND WIFE MEET 3% OUT OF POCKET WHICH WOULD BE AROUND $875 BETWEEN THEM ?SAMPLES OF ELIQUIS LEFT OF FRONT ?WILL F/U WITH PATIENT AS INFORMATION IS DISCOVERED ?STABLE ON CURRENT REGIMEN; denies signs/symptoms of bleeding ? ?Subjective: ?Theodore Mendoza is an 81 y.o. year old male who is a primary patient of Loman Brooklyn, FNP.  The CCM team was consulted for assistance with disease management and care coordination needs.   ? ?Engaged with patient by telephone for initial visit in response to provider referral for pharmacy case management and/or care coordination services.  ? ?Consent to Services:  ?The patient was given information about Chronic Care Management services, agreed to services, and gave verbal consent prior to initiation of services.  Please see initial visit note for detailed documentation.  ? ?Patient Care Team: ?Loman Brooklyn, FNP as PCP - General (Family Medicine) ?Loretta Plume as Consulting Physician (Neurosurgery) ?Alexis Frock, MD as Consulting Physician (Urology) ?Lavera Guise, [facility] (Pharmacist) ?Celestia Khat, OD (Optometry) ? ?Objective: ? ?Lab Results  ?Component Value Date  ? CREATININE 0.72 (L) 06/02/2021  ? CREATININE 0.82 02/01/2021  ? CREATININE 0.77 10/21/2020  ? ? ?Lab Results  ?Component Value Date  ? HGBA1C 4.7 05/06/2020  ? ?Last diabetic Eye exam: No results found for:  HMDIABEYEEXA  ?Last diabetic Foot exam: No results found for: HMDIABFOOTEX  ? ?   ?Component Value Date/Time  ? CHOL 126 06/02/2021 1014  ? TRIG 48 06/02/2021 1014  ? HDL 49 06/02/2021 1014  ? CHOLHDL 2.6 06/02/2021 1014  ? CHOLHDL 3.2 10/02/2015 0723  ? VLDL 8 10/02/2015 0723  ? [place] 66 06/02/2021 1014  ? ? ? ?  Latest Ref Rng & Units 06/02/2021  ? 10:14 AM 02/01/2021  ? 12:31 PM 09/02/2020  ?  4:34 PM  ?Hepatic Function  ?Total Protein 6.0 - 8.5 g/dL 5.6   5.7   6.2    ?Albumin 3.6 - 4.6 g/dL 4.0   3.8   3.7    ?AST 0 - 40 IU/L 13   13   11     ?ALT 0 - 44 IU/L 9   6   7     ?Alk Phosphatase 44 - 121 IU/L 66   66   59    ?Total Bilirubin 0.0 - 1.2 mg/dL 0.5   0.4   1.7    ? ? ?Lab Results  ?Component Value Date/Time  ? TSH 3.590 05/06/2020 10:57 AM  ? TSH 3.241 12/14/2019 03:28 PM  ? ? ? ?  Latest Ref Rng & Units 06/02/2021  ? 10:14 AM 02/01/2021  ? 12:31 PM 10/21/2020  ?  3:20 PM  ?CBC  ?WBC 3.4 - 10.8 x10E3/uL 5.4   5.7   6.8    ?Hemoglobin 13.0 - 17.7 g/dL 12.5   11.0   11.2    ?Hematocrit 37.5 - 51.0 % 38.3   35.0   35.5    ?  Platelets 150 - 450 x10E3/uL 169   205   169    ? ? ?No results found for: VD25OH ? ?Clinical ASCVD: Yes  ?The ASCVD Risk score (Arnett DK, et al., 2019) failed to calculate for the following reasons: ?  The 2019 ASCVD risk score is only valid for ages 67 to 87 ?  The patient has a prior MI or stroke diagnosis   ? ?Other: (CHADS2VASc if Afib, PHQ9 if depression, MMRC or CAT for COPD, ACT, DEXA) ? ?Social History  ? ?Tobacco Use  ?Smoking Status Never  ?Smokeless Tobacco Never  ? ?BP Readings from Last 3 Encounters:  ?06/02/21 138/76  ?04/24/21 132/64  ?02/01/21 138/75  ? ?Pulse Readings from Last 3 Encounters:  ?06/02/21 68  ?04/24/21 64  ?02/01/21 (!) 54  ? ?Wt Readings from Last 3 Encounters:  ?05/24/21 150 lb (68 kg)  ?04/24/21 150 lb (68 kg)  ?10/21/20 150 lb (68 kg)  ? ? ?Assessment: Review of patient past medical history, allergies, medications, health status, including review of consultants  reports, laboratory and other test data, was performed as part of comprehensive evaluation and provision of chronic care management services.  ? ?SDOH:  (Social Determinants of Health) assessments and interventions performed:  ? ? ?CCM Care Plan ? ?Allergies  ?Allergen Reactions  ? Hydrocodone-Acetaminophen   ?  Passed out and got real weak  ? Morphine And Related Other (See Comments)  ?  Reaction:  Confusion/weakness/hallucinations  ? Valsartan Rash  ? ? ?Medications Reviewed Today   ? ? Reviewed by Loman Brooklyn, FNP (Family Nurse Practitioner) on 06/06/21 at [place] List Status: <None>  ? ?Medication Order Taking? Sig Documenting Provider Last Dose Status Informant  ?albuterol (VENTOLIN HFA) 108 (90 Base) MCG/ACT inhaler [id number] Yes INHALE 2 PUFFS INTO THE LUNGS EVERY 6 HOURS AS NEEDED FOR WHEEZING OR SHORTNESS OF Estil Daft F, FNP Taking Active   ?amLODipine (NORVASC) 5 MG tablet [id number] Yes TAKE 1 TABLET DAILY Loman Brooklyn, FNP Taking Active   ?apixaban (ELIQUIS) 5 MG TABS tablet [id number]  Take 1 tablet (5 mg total) by mouth 2 (two) times daily. [name] F, FNP  Active   ?dofetilide [facility]) 500 MCG capsule [id number] Yes Take 1 capsule (500 mcg total) by mouth 2 (two) times daily. Loman Brooklyn, FNP Taking Active Self  ?escitalopram (LEXAPRO) 20 MG tablet [id number]  Take 1 tablet (20 mg total) by mouth daily. Loman Brooklyn, FNP  Active   ?Ferrous Sulfate (IRON) 325 (65 Fe) MG TABS [id number] Yes Take 1 tablet (325 mg total) by mouth daily. Montez Morita, Quillian Quince, MD Taking Active Self  ?furosemide (LASIX) 20 MG tablet [id number] Yes Take 1 tablet (20 mg total) by mouth daily. Loman Brooklyn, FNP Taking Active   ?gabapentin (NEURONTIN) 300 MG capsule [id number] Yes Take 300 mg by mouth three times daily [name] F, FNP Taking Active   ?lisinopril (ZESTRIL) 5 MG tablet [id number] Yes TAKE 1 TABLET ONCE DAILY Loman Brooklyn, FNP Taking Active   ?mirtazapine (REMERON)  7.5 MG tablet [id number]  Take 1 tablet (7.5 mg total) by mouth at bedtime. Loman Brooklyn, FNP  Active   ?pantoprazole (PROTONIX) 40 MG tablet [id number] Yes Take 1 tablet (40 mg total) by mouth 2 (two) times daily. Loman Brooklyn, FNP Taking Active   ?pravastatin (PRAVACHOL) 40 MG tablet [id number] Yes Take 1 tablet (40 mg total) by mouth daily. Loman Brooklyn, FNP Taking Active   ?  sucralfate (CARAFATE) 1 g tablet [id number]  Take 1 tablet (1 g total) by mouth 2 (two) times daily. [name] F, FNP  Active   ?tamsulosin [facility]) 0.4 MG CAPS capsule [id number] Yes Take 1 capsule (0.4 mg total) by mouth daily. Loman Brooklyn, FNP Taking Active   ?vitamin B-12 (CYANOCOBALAMIN) 1000 MCG tablet [id number] Yes Take 1 tablet (1,000 mcg total) by mouth daily. Patrecia Pour, MD Taking Active Self  ? ?  ?  ? ?  ? ? ?Patient Active Problem List  ? Diagnosis Date Noted  ? Severe episode of recurrent major depressive disorder, without psychotic features ([place]) 11/01/2020  ? Generalized anxiety disorder 11/01/2020  ? GBS (Guillain Barre syndrome) ([place]) 08/12/2020  ? Long term current use of anticoagulant 01/20/2020  ? Abdominal aortic aneurysm (AAA) without rupture ([place])   ? Persistent atrial fibrillation ([place]) 04/21/2019  ? Paraplegia, incomplete ([place]) 10/16/2018  ? Idiopathic peripheral neuropathy 06/26/2017  ? Late effects of CVA (cerebrovascular accident) 11/16/2015  ? History of TIA (transient ischemic attack) 10/01/2015  ? Hyperlipidemia 05/08/2014  ? Essential (primary) hypertension 05/07/2014  ? Meningioma ([place]) 12/13/2011  ? History of non-ST elevation myocardial infarction (NSTEMI) 01/17/2011  ? ? ?Immunization History  ?Administered Date(s) Administered  ? Pneumococcal Conjugate-13 01/15/2019  ? Pneumococcal Polysaccharide-23 02/03/2020  ? ? ?Conditions to be addressed/monitored: ?Atrial Fibrillation, CAD, and HTN ? ?Care Plan : General Pharmacy (Adult)  ?Updates made by Lavera Guise, RPH since 07/30/2021  12:00 AM  ?  ? ?Problem: DISEASE PROGRESSION PREVENTION   ?  ? ?Long-Range Goal: AFIB, HTN   ?Note:   ?Current Barriers:  ?Unable to independently afford treatment regimen ? ?Pharmacist Clinical Goal(s):  ?p

## 2021-07-30 NOTE — Patient Instructions (Addendum)
Visit Information ? ?Following are the goals we discussed today:  ?Current Barriers:  ?Unable to independently afford treatment regimen ? ?Pharmacist Clinical Goal(s):  ?patient will verbalize ability to afford treatment regimen through collaboration with PharmD and provider.  ? ?Interventions: ?1:1 collaboration with Loman Brooklyn, FNP regarding development and update of comprehensive plan of care as evidenced by provider attestation and co-signature ?Inter-disciplinary care team collaboration (see longitudinal plan of care) ?Comprehensive medication review performed; medication list updated in electronic medical record ? ?Atrial Fibrillation: ?Controlled/challenging for patient to afford ?Current rate/rhythm control: TIKOSYN; anticoagulant treatment: ELIQUIS ?Paroxysmal AF on Eliquis ?-CHADS2-VASc Score is 57 (age, HTN, vascular disease, hx of prior CVA) ?Home blood pressure, heart rate readings:  ?Assessed patient finances.   ?TIKOSYN GENERIC NOW $75 PER MONTH on cash ?NO PAP FOR TIKOSYN BUT MAY BE ABLE TO DO GOOD RX AT [facility] FOR $45 ?CHECKING ON MEDICARE RULES ?ELIQUIS PAP ONLY WHEN PATIENT AND WIFE MEET 3% OUT OF POCKET WHICH WOULD BE AROUND $875 BETWEEN THEM ?SAMPLES OF ELIQUIS LEFT OF FRONT ?WILL F/U WITH PATIENT AS INFORMATION IS DISCOVERED ?STABLE ON CURRENT REGIMEN; denies signs/symptoms of bleeding ? ?Patient Goals/Self-Care Activities ?patient will:  ?- take medications as prescribed as evidenced by patient report and record review ?collaborate with provider on medication access solutions ? ? ?Plan: Telephone follow up appointment with care management team member scheduled for:  1 WEEK ? ?Signature ?Regina Eck, PharmD, BCPS ?Clinical Pharmacist, [facility] ?[place]  II Phone [phone] ? ? ?Please call the care guide team at [phone] if you need to cancel or reschedule your appointment.  ? ?The patient verbalized understanding of instructions, educational  materials, and care plan provided today and declined offer to receive copy of patient instructions, educational materials, and care plan.  ? ?

## 2021-08-01 ENCOUNTER — Other Ambulatory Visit: Payer: Self-pay | Admitting: Family Medicine

## 2021-08-01 ENCOUNTER — Ambulatory Visit: Payer: Medicare Other | Admitting: Pharmacist

## 2021-08-01 DIAGNOSIS — I4819 Other persistent atrial fibrillation: Secondary | ICD-10-CM

## 2021-08-01 DIAGNOSIS — R601 Generalized edema: Secondary | ICD-10-CM

## 2021-08-01 DIAGNOSIS — I10 Essential (primary) hypertension: Secondary | ICD-10-CM

## 2021-08-09 NOTE — Progress Notes (Signed)
?  Care Management  ? ?Follow Up Note ? ? ?08/09/2021 ?Name: KEYLER HOGE MRN: [id number] DOB: Apr 28, 1940 ? ? ?Referred by: Loman Brooklyn, FNP ?Reason for referral : Chronic Care Management and Atrial Fibrillation ? ? ?An unsuccessful telephone outreach was attempted today. The patient was referred to the case management team for assistance with care management and care coordination.  ? ?Follow Up Plan: Telephone follow up appointment with care management team member scheduled IEP:PIRJ week ? ? ? ?Regina Eck, PharmD, BCPS ?Clinical Pharmacist, [facility] ?[place]  II Phone [phone] ? ? ?

## 2021-08-11 ENCOUNTER — Ambulatory Visit (INDEPENDENT_AMBULATORY_CARE_PROVIDER_SITE_OTHER): Payer: Medicare Other | Admitting: Pharmacist

## 2021-08-11 DIAGNOSIS — I4819 Other persistent atrial fibrillation: Secondary | ICD-10-CM

## 2021-08-11 DIAGNOSIS — I10 Essential (primary) hypertension: Secondary | ICD-10-CM

## 2021-08-11 MED ORDER — DOFETILIDE 500 MCG PO CAPS: 500.0000 ug | ORAL_CAPSULE | Freq: Two times a day (BID) | ORAL | 3 refills | Status: DC

## 2021-08-11 NOTE — Progress Notes (Signed)
Chronic Care Management Pharmacy Note  08/11/2021 Name:  Theodore Mendoza MRN:  [id number] DOB:  [birth year]  Summary: Atrial Fibrillation: Controlled/challenging for patient to afford Current rate/rhythm control: TIKOSYN; anticoagulant treatment: ELIQUIS Paroxysmal AF on Eliquis -CHADS2-VASc Score is 2 (age, HTN, vascular disease, hx of prior CVA) Home blood pressure, heart rate readings:  Assessed patient finances.   TIKOSYN GENERIC NOW $75 PER MONTH on cash NO PAP FOR TIKOSYN BUT ABLE TO DO GOOD RX AT [facility] FOR $45 (PATIENT PICKED UP!) ELIQUIS PAP ONLY WHEN PATIENT AND WIFE MEET 3% OUT OF POCKET WHICH WOULD BE AROUND $875 BETWEEN THEM ADDITIONAL SAMPLES OF ELIQUIS LEFT OF FRONT TODAY WILL F/U WITH PATIENT AS INFORMATION IS DISCOVERED STABLE ON CURRENT REGIMEN; denies signs/symptoms of bleeding  Subjective: Theodore Mendoza is an 81 y.o. year old male who is a primary patient of Loman Brooklyn, FNP.  The CCM team was consulted for assistance with disease management and care coordination needs.    Engaged with patient by telephone for follow up visit in response to provider referral for pharmacy case management and/or care coordination services.   Consent to Services:  The patient was given information about Chronic Care Management services, agreed to services, and gave verbal consent prior to initiation of services.  Please see initial visit note for detailed documentation.   Patient Care Team: Loman Brooklyn, FNP as PCP - General (Family Medicine) Loretta Plume as Consulting Physician (Neurosurgery) Alexis Frock, MD as Consulting Physician (Urology) Lavera Guise, [facility] (Pharmacist) Celestia Khat, OD (Optometry)   Objective:  Lab Results  Component Value Date   CREATININE 0.72 (L) 06/02/2021   CREATININE 0.82 02/01/2021   CREATININE 0.77 10/21/2020    Lab Results  Component Value Date   HGBA1C 4.7 05/06/2020   Last diabetic Eye exam: No results found for:  HMDIABEYEEXA  Last diabetic Foot exam: No results found for: HMDIABFOOTEX      Component Value Date/Time   CHOL 126 06/02/2021 1014   TRIG 48 06/02/2021 1014   HDL 49 06/02/2021 1014   CHOLHDL 2.6 06/02/2021 1014   CHOLHDL 3.2 10/02/2015 0723   VLDL 8 10/02/2015 0723   LDLCALC 66 06/02/2021 1014       Latest Ref Rng & Units 06/02/2021   10:14 AM 02/01/2021   12:31 PM 09/02/2020    4:34 PM  Hepatic Function  Total Protein 6.0 - 8.5 g/dL 5.6   5.7   6.2    Albumin 3.6 - 4.6 g/dL 4.0   3.8   3.7    AST 0 - 40 IU/L 13   13   11     ALT 0 - 44 IU/L 9   6   7     Alk Phosphatase 44 - 121 IU/L 66   66   59    Total Bilirubin 0.0 - 1.2 mg/dL 0.5   0.4   1.7      Lab Results  Component Value Date/Time   TSH 3.590 05/06/2020 10:57 AM   TSH 3.241 12/14/2019 03:28 PM       Latest Ref Rng & Units 06/02/2021   10:14 AM 02/01/2021   12:31 PM 10/21/2020    3:20 PM  CBC  WBC 3.4 - 10.8 x10E3/uL 5.4   5.7   6.8    Hemoglobin 13.0 - 17.7 g/dL 12.5   11.0   11.2    Hematocrit 37.5 - 51.0 % 38.3   35.0   35.5  Platelets 150 - 450 x10E3/uL 169   205   169      No results found for: VD25OH  Clinical ASCVD: Yes  The ASCVD Risk score (Arnett DK, et al., 2019) failed to calculate for the following reasons:   The 2019 ASCVD risk score is only valid for ages 79 to 48   The patient has a prior MI or stroke diagnosis    Other: (CHADS2VASc if Afib, PHQ9 if depression, MMRC or CAT for COPD, ACT, DEXA)  Social History   Tobacco Use  Smoking Status Never  Smokeless Tobacco Never   BP Readings from Last 3 Encounters:  06/02/21 138/76  04/24/21 132/64  02/01/21 138/75   Pulse Readings from Last 3 Encounters:  06/02/21 68  04/24/21 64  02/01/21 (!) 54   Wt Readings from Last 3 Encounters:  05/24/21 150 lb (68 kg)  04/24/21 150 lb (68 kg)  10/21/20 150 lb (68 kg)    Assessment: Review of patient past medical history, allergies, medications, health status, including review of consultants  reports, laboratory and other test data, was performed as part of comprehensive evaluation and provision of chronic care management services.   SDOH:  (Social Determinants of Health) assessments and interventions performed:    CCM Care Plan  Allergies  Allergen Reactions   Hydrocodone-Acetaminophen     Passed out and got real weak   Morphine And Related Other (See Comments)    Reaction:  Confusion/weakness/hallucinations   Valsartan Rash    Medications Reviewed Today     Reviewed by Lavera Guise, [facility] (Pharmacist) on 08/18/21 at [place] List Status: <None>   Medication Order Taking? Sig Documenting Provider Last Dose Status Informant  albuterol (VENTOLIN HFA) 108 (90 Base) MCG/ACT inhaler [id number]  INHALE 2 PUFFS INTO THE LUNGS EVERY 6 HOURS AS NEEDED FOR WHEEZING OR SHORTNESS OF BREATH [name] F, FNP  Active   amLODipine (NORVASC) 5 MG tablet [id number]  TAKE 1 TABLET DAILY Loman Brooklyn, FNP  Active   apixaban (ELIQUIS) 5 MG TABS tablet [id number]  Take 1 tablet (5 mg total) by mouth 2 (two) times daily. [name] F, FNP  Active   dofetilide [facility]) 500 MCG capsule [id number]  Take 1 capsule (500 mcg total) by mouth 2 (two) times daily. [name] F, FNP  Active            Med Note Blanca Friend, Royce Macadamia   Fri Aug 18, 2021  3:47 PM) Good rx [facility] to [facility] (other Rxs go to [place])  escitalopram (LEXAPRO) 20 MG tablet [id number]  Take 1 tablet (20 mg total) by mouth daily. Loman Brooklyn, FNP  Active   Ferrous Sulfate (IRON) 325 (65 Fe) MG TABS [id number] No Take 1 tablet (325 mg total) by mouth daily. Montez Morita, Quillian Quince, MD Taking Active Self  furosemide (LASIX) 20 MG tablet [id number]  TAKE ONE TABLET ONCE DAILY Loman Brooklyn, FNP  Active   gabapentin (NEURONTIN) 300 MG capsule [id number] No Take 300 mg by mouth three times daily [name] F, FNP Taking Active   lisinopril (ZESTRIL) 5 MG tablet [id number] No TAKE 1 TABLET ONCE DAILY Loman Brooklyn, FNP Taking Active   mirtazapine (REMERON) 7.5 MG tablet [id number]  Take 1 tablet (7.5 mg total) by mouth at bedtime. Loman Brooklyn, FNP  Active   pantoprazole (PROTONIX) 40 MG tablet [id number] No Take 1 tablet (40 mg total) by mouth 2 (two) times daily. Loman Brooklyn,  FNP Taking Active   pravastatin (PRAVACHOL) 40 MG tablet [id number] No Take 1 tablet (40 mg total) by mouth daily. Loman Brooklyn, FNP Taking Active   sucralfate (CARAFATE) 1 g tablet [id number]  Take 1 tablet (1 g total) by mouth 2 (two) times daily. [name] F, FNP  Active   tamsulosin [facility]) 0.4 MG CAPS capsule [id number] No Take 1 capsule (0.4 mg total) by mouth daily. Loman Brooklyn, FNP Taking Active   vitamin B-12 (CYANOCOBALAMIN) 1000 MCG tablet [id number] No Take 1 tablet (1,000 mcg total) by mouth daily. Patrecia Pour, MD Taking Active Self            Patient Active Problem List   Diagnosis Date Noted   Severe episode of recurrent major depressive disorder, without psychotic features ([place]) 11/01/2020   Generalized anxiety disorder 11/01/2020   GBS (Guillain Barre syndrome) ([place]) 08/12/2020   Long term current use of anticoagulant 01/20/2020   Abdominal aortic aneurysm (AAA) without rupture ([facility])    Persistent atrial fibrillation ([place]) 04/21/2019   Paraplegia, incomplete ([place]) 10/16/2018   Idiopathic peripheral neuropathy 06/26/2017   Late effects of CVA (cerebrovascular accident) 11/16/2015   History of TIA (transient ischemic attack) 10/01/2015   Hyperlipidemia 05/08/2014   Essential (primary) hypertension 05/07/2014   Meningioma ([place]) 12/13/2011   History of non-ST elevation myocardial infarction (NSTEMI) 01/17/2011    Immunization History  Administered Date(s) Administered   Pneumococcal Conjugate-13 01/15/2019   Pneumococcal Polysaccharide-23 02/03/2020    Conditions to be addressed/monitored: Atrial Fibrillation and HTN  Care Plan : General Pharmacy (Adult)  Updates made  by Lavera Guise, [place] since 08/18/2021 12:00 AM     Problem: DISEASE PROGRESSION PREVENTION      Long-Range Goal: AFIB, HTN   Note:   Current Barriers:  Unable to independently afford treatment regimen  Pharmacist Clinical Goal(s):  patient will verbalize ability to afford treatment regimen through collaboration with PharmD and provider.   Interventions: 1:1 collaboration with Loman Brooklyn, FNP regarding development and update of comprehensive plan of care as evidenced by provider attestation and co-signature Inter-disciplinary care team collaboration (see longitudinal plan of care) Comprehensive medication review performed; medication list updated in electronic medical record  Atrial Fibrillation: Controlled/challenging for patient to afford Current rate/rhythm control: TIKOSYN; anticoagulant treatment: ELIQUIS Paroxysmal AF on Eliquis -CHADS2-VASc Score is 67 (age, HTN, vascular disease, hx of prior CVA) Home blood pressure, heart rate readings:  Assessed patient finances.   TIKOSYN GENERIC NOW $75 PER MONTH on cash NO PAP FOR TIKOSYN BUT ABLE TO DO GOOD RX AT [facility] FOR $45 (PATIENT PICKED UP!) ELIQUIS PAP ONLY WHEN PATIENT AND WIFE MEET 3% OUT OF POCKET WHICH WOULD BE AROUND $875 BETWEEN THEM ADDITIONAL SAMPLES OF ELIQUIS LEFT OF FRONT TODAY WILL F/U WITH PATIENT AS INFORMATION IS DISCOVERED STABLE ON CURRENT REGIMEN; denies signs/symptoms of bleeding  Patient Goals/Self-Care Activities patient will:  - take medications as prescribed as evidenced by patient report and record review collaborate with provider on medication access solutions      Medication Assistance: TIKOSYN GENERIC VIA [place]  Follow Up:  Patient agrees to Care Plan and Follow-up.  Plan: Telephone follow up appointment with care management team member scheduled for:  3 MONTHS   Regina Eck, PharmD, BCPS Clinical Pharmacist, [place]  II Phone [phone]

## 2021-08-18 NOTE — Patient Instructions (Signed)
Visit Information  Following are the goals we discussed today:  Current Barriers:  Unable to independently afford treatment regimen  Pharmacist Clinical Goal(s):  patient will verbalize ability to afford treatment regimen through collaboration with PharmD and provider.   Interventions: 1:1 collaboration with Loman Brooklyn, FNP regarding development and update of comprehensive plan of care as evidenced by provider attestation and co-signature Inter-disciplinary care team collaboration (see longitudinal plan of care) Comprehensive medication review performed; medication list updated in electronic medical record  Atrial Fibrillation: Controlled/challenging for patient to afford Current rate/rhythm control: TIKOSYN; anticoagulant treatment: ELIQUIS Paroxysmal AF on Eliquis -CHADS2-VASc Score is 54 (age, HTN, vascular disease, hx of prior CVA) Home blood pressure, heart rate readings:  Assessed patient finances.   TIKOSYN GENERIC NOW $75 PER MONTH on cash NO PAP FOR TIKOSYN BUT ABLE TO DO GOOD RX AT [facility] FOR $45 (PATIENT PICKED UP!) ELIQUIS PAP ONLY WHEN PATIENT AND WIFE MEET 3% OUT OF POCKET WHICH WOULD BE AROUND $875 BETWEEN THEM ADDITIONAL SAMPLES OF ELIQUIS LEFT OF FRONT TODAY WILL F/U WITH PATIENT AS INFORMATION IS DISCOVERED STABLE ON CURRENT REGIMEN; denies signs/symptoms of bleeding  Patient Goals/Self-Care Activities patient will:  - take medications as prescribed as evidenced by patient report and record review collaborate with provider on medication access solutions    Signature Regina Eck, PharmD, BCPS Clinical Pharmacist, [place]  II Phone [phone]   Please call the care guide team at [phone] if you need to cancel or reschedule your appointment.   The patient verbalized understanding of instructions, educational materials, and care plan provided today and DECLINED offer to receive copy of patient  instructions, educational materials, and care plan.

## 2021-08-23 ENCOUNTER — Other Ambulatory Visit: Payer: Self-pay | Admitting: Family Medicine

## 2021-08-23 DIAGNOSIS — I252 Old myocardial infarction: Secondary | ICD-10-CM

## 2021-08-23 DIAGNOSIS — K219 Gastro-esophageal reflux disease without esophagitis: Secondary | ICD-10-CM

## 2021-08-23 DIAGNOSIS — E782 Mixed hyperlipidemia: Secondary | ICD-10-CM

## 2021-08-23 DIAGNOSIS — I714 Abdominal aortic aneurysm, without rupture, unspecified: Secondary | ICD-10-CM

## 2021-08-23 DIAGNOSIS — Z8673 Personal history of transient ischemic attack (TIA), and cerebral infarction without residual deficits: Secondary | ICD-10-CM

## 2021-08-30 DIAGNOSIS — I4891 Unspecified atrial fibrillation: Secondary | ICD-10-CM

## 2021-08-30 DIAGNOSIS — I4819 Other persistent atrial fibrillation: Secondary | ICD-10-CM

## 2021-08-30 DIAGNOSIS — I10 Essential (primary) hypertension: Secondary | ICD-10-CM

## 2021-09-11 ENCOUNTER — Other Ambulatory Visit: Payer: Self-pay | Admitting: Family Medicine

## 2021-09-11 DIAGNOSIS — I10 Essential (primary) hypertension: Secondary | ICD-10-CM

## 2021-09-18 ENCOUNTER — Other Ambulatory Visit: Payer: Self-pay | Admitting: Family Medicine

## 2021-09-29 ENCOUNTER — Encounter: Payer: Self-pay | Admitting: Family Medicine

## 2021-09-29 ENCOUNTER — Ambulatory Visit (INDEPENDENT_AMBULATORY_CARE_PROVIDER_SITE_OTHER): Payer: Medicare Other | Admitting: Family Medicine

## 2021-09-29 VITALS — BP 132/64 | HR 51 | Temp 98.0°F

## 2021-09-29 DIAGNOSIS — I252 Old myocardial infarction: Secondary | ICD-10-CM

## 2021-09-29 DIAGNOSIS — Z8673 Personal history of transient ischemic attack (TIA), and cerebral infarction without residual deficits: Secondary | ICD-10-CM

## 2021-09-29 DIAGNOSIS — I4819 Other persistent atrial fibrillation: Secondary | ICD-10-CM | POA: Diagnosis not present

## 2021-09-29 DIAGNOSIS — Z7901 Long term (current) use of anticoagulants: Secondary | ICD-10-CM

## 2021-09-29 DIAGNOSIS — I714 Abdominal aortic aneurysm, without rupture, unspecified: Secondary | ICD-10-CM

## 2021-09-29 DIAGNOSIS — F411 Generalized anxiety disorder: Secondary | ICD-10-CM

## 2021-09-29 DIAGNOSIS — K219 Gastro-esophageal reflux disease without esophagitis: Secondary | ICD-10-CM

## 2021-09-29 DIAGNOSIS — G8222 Paraplegia, incomplete: Secondary | ICD-10-CM

## 2021-09-29 DIAGNOSIS — I10 Essential (primary) hypertension: Secondary | ICD-10-CM

## 2021-09-29 DIAGNOSIS — G609 Hereditary and idiopathic neuropathy, unspecified: Secondary | ICD-10-CM

## 2021-09-29 DIAGNOSIS — F332 Major depressive disorder, recurrent severe without psychotic features: Secondary | ICD-10-CM

## 2021-09-29 DIAGNOSIS — Z596 Low income: Secondary | ICD-10-CM

## 2021-09-29 DIAGNOSIS — E782 Mixed hyperlipidemia: Secondary | ICD-10-CM

## 2021-09-29 DIAGNOSIS — D329 Benign neoplasm of meninges, unspecified: Secondary | ICD-10-CM

## 2021-09-29 DIAGNOSIS — G610 Guillain-Barre syndrome: Secondary | ICD-10-CM

## 2021-09-29 LAB — CMP14+EGFR
ALT: 7 IU/L (ref 0–44)
AST: 15 IU/L (ref 0–40)
Albumin/Globulin Ratio: 1.6 (ref 1.2–2.2)
Albumin: 3.6 g/dL (ref 3.6–4.6)
Alkaline Phosphatase: 67 IU/L (ref 44–121)
BUN/Creatinine Ratio: 16 (ref 10–24)
BUN: 11 mg/dL (ref 8–27)
Bilirubin Total: 0.5 mg/dL (ref 0.0–1.2)
CO2: 28 mmol/L (ref 20–29)
Calcium: 8.7 mg/dL (ref 8.6–10.2)
Chloride: 104 mmol/L (ref 96–106)
Creatinine, Ser: 0.7 mg/dL — ABNORMAL LOW (ref 0.76–1.27)
Globulin, Total: 2.2 g/dL (ref 1.5–4.5)
Glucose: 87 mg/dL (ref 70–99)
Potassium: 4.1 mmol/L (ref 3.5–5.2)
Sodium: 144 mmol/L (ref 134–144)
Total Protein: 5.8 g/dL — ABNORMAL LOW (ref 6.0–8.5)
eGFR: 91 mL/min/{1.73_m2} (ref >59)

## 2021-09-29 LAB — CBC WITH DIFFERENTIAL/PLATELET
Basophils Absolute: 0.1 10*3/uL (ref 0.0–0.2)
Basos: 2 %
EOS (ABSOLUTE): 0.3 10*3/uL (ref 0.0–0.4)
Eos: 6 %
Hematocrit: 36.9 % — ABNORMAL LOW (ref 37.5–51.0)
Hemoglobin: 11.9 g/dL — ABNORMAL LOW (ref 13.0–17.7)
Immature Grans (Abs): 0 10*3/uL (ref 0.0–0.1)
Immature Granulocytes: 0 %
Lymphocytes Absolute: 1.2 10*3/uL (ref 0.7–3.1)
Lymphs: 24 %
MCH: 28.8 pg (ref 26.6–33.0)
MCHC: 32.2 g/dL (ref 31.5–35.7)
MCV: 89 fL (ref 79–97)
Monocytes Absolute: 0.3 10*3/uL (ref 0.1–0.9)
Monocytes: 7 %
Neutrophils Absolute: 3 10*3/uL (ref 1.4–7.0)
Neutrophils: 61 %
Platelets: 173 10*3/uL (ref 150–450)
RBC: 4.13 x10E6/uL — ABNORMAL LOW (ref 4.14–5.80)
RDW: 12.5 % (ref 11.6–15.4)
WBC: 4.8 10*3/uL (ref 3.4–10.8)

## 2021-09-29 LAB — LIPID PANEL
Chol/HDL Ratio: 2.3 ratio (ref 0.0–5.0)
Cholesterol, Total: 106 mg/dL (ref 100–199)
HDL: 47 mg/dL (ref >39)
LDL Chol Calc (NIH): 48 mg/dL (ref 0–99)
Triglycerides: 43 mg/dL (ref 0–149)
VLDL Cholesterol Cal: 11 mg/dL (ref 5–40)

## 2021-09-29 MED ORDER — GABAPENTIN 300 MG PO CAPS: ORAL_CAPSULE | ORAL | 1 refills | Status: DC

## 2021-09-29 NOTE — Patient Instructions (Signed)
[place]

## 2021-09-29 NOTE — Progress Notes (Unsigned)
Assessment & Plan:  1. Generalized anxiety disorder Well controlled on current regimen.  - escitalopram (LEXAPRO) 20 MG tablet; Take 1 tablet (20 mg total) by mouth daily.  Dispense: 90 tablet; Refill: 1 - mirtazapine (REMERON) 7.5 MG tablet; Take 1 tablet (7.5 mg total) by mouth at bedtime.  Dispense: 90 tablet; Refill: 1 - CMP14+EGFR  2. Severe episode of recurrent major depressive disorder, without psychotic features (Theodore Mendoza) Well controlled on current regimen.  - escitalopram (LEXAPRO) 20 MG tablet; Take 1 tablet (20 mg total) by mouth daily.  Dispense: 90 tablet; Refill: 1 - mirtazapine (REMERON) 7.5 MG tablet; Take 1 tablet (7.5 mg total) by mouth at bedtime.  Dispense: 90 tablet; Refill: 1 - CMP14+EGFR  3. Gastroesophageal reflux disease, unspecified whether esophagitis present Well controlled on current regimen.  - sucralfate (CARAFATE) 1 g tablet; Take 1 tablet (1 g total) by mouth 2 (two) times daily.  Dispense: 180 tablet; Refill: 1 - CMP14+EGFR  4-5. GBS (Guillain Barre syndrome) (Theodore Mendoza)/Paraplegia, incomplete Theodore Mendoza) Patient does well caring for himself.   6. Idiopathic peripheral neuropathy Well controlled on current regimen.  - CMP14+EGFR  7. Meningioma (Theodore Mendoza) Followed by neurosurgery who he does not have to see again until 2024.   8. Persistent atrial fibrillation (Theodore Mendoza) Well controlled on current regimen. Managed by cardiology. - apixaban (ELIQUIS) 5 MG TABS tablet; Take 1 tablet (5 mg total) by mouth 2 (two) times daily.  Dispense: 180 tablet; Refill: 3 - CBC with Differential/Platelet - CMP14+EGFR  9. Essential (primary) hypertension Well controlled on current regimen.  - CBC with Differential/Platelet - CMP14+EGFR - Lipid panel  10. History of non-ST elevation myocardial infarction (NSTEMI) Continue Pravastatin and Eliquis. - CMP14+EGFR - Lipid panel  11. History of TIA (transient ischemic attack) Continue Pravastatin and Eliquis. - CMP14+EGFR - Lipid  panel  12. Abdominal aortic aneurysm (AAA) without rupture, unspecified part Stable. Followed by vascular.  13. Mixed hyperlipidemia Well controlled on current regimen.  - CBC with Differential/Platelet - CMP14+EGFR - Lipid panel  14-15. Excessive daytime sleepiness/Apnea Home sleep study to be completed.  ***  No follow-ups on file.  [name], MSN, APRN, FNP-C [facility]  Subjective:    Patient ID: Theodore Mendoza, male    DOB: 1940-04-16, 81 y.o.   MRN: [id number]  Patient Care Team: Loman Brooklyn, FNP as PCP - General (Family Medicine) Loretta Plume as Consulting Physician (Neurosurgery) Alexis Frock, MD as Consulting Physician (Urology) Lavera Guise, [facility] (Pharmacist) Celestia Khat, OD (Optometry)   Chief Complaint:  Chief Complaint  Patient presents with   Medical Management of Chronic Issues    HPI: Theodore Mendoza is a 81 y.o. male presenting on 09/29/2021 for Medical Management of Chronic Issues  A-Fib: managed by cardiology. Taking Tikosyn and Eliquis. Does not qualify for prescription assistance.   Hypertension: taking amlodipine 5 mg, Lisinopril 5 mg, and furosemide 20 mg.   Hyperlipidemia/History of NSTEMI/History of TIA: taking pravastatin daily.  Rosalee Kaufman Syndrome/Neuropathy: taking gabapentin.  AAA: Patient has seen vascular surgery who does not recommend surgery until he is at least 5 cm due to his multiple comorbidities.  Last CT scan on 08/11/2020 showed the abdominal aorta measuring 4.7 cm.   GERD: taking pantoprazole.   Anxiety/Depression: patient feels like he is doing much better. He is still concerned that he is sleeping all the time. His cardiologist has suggested an evaluation for OSA. His wife and his daughter are currently battling cancer. He has  been taking Lexapro 20 mg once daily and Remeron 7.5 mg was added three months ago.      09/29/2021    9:59 AM 06/02/2021   10:05 AM 05/24/2021    2:08  PM  Depression screen PHQ 2/9  Decreased Interest 0 2 1  Down, Depressed, Hopeless _0 PHQ - 2 Score _1 Altered sleeping 0 3 1  Tired, decreased energy _2 Change in appetite 0 1 1  Feeling bad or failure about yourself  1 0 1  Trouble concentrating 0 0 1  Moving slowly or fidgety/restless 2 0 0  Suicidal thoughts 0 0 0  PHQ-9 Score _3 Difficult doing work/chores Not difficult at all Not difficult at all Somewhat difficult      09/29/2021    9:59 AM 06/02/2021   10:06 AM 02/01/2021    4:12 PM 11/01/2020    8:41 AM  GAD 7 : Generalized Anxiety Score  Nervous, Anxious, on Edge 2 0 2 1  Control/stop worrying _4 Worry too much - different things _5 0  Trouble relaxing 0 0 3 1  Restless 3 0 2 2  Easily annoyed or irritable _6 Afraid - awful might happen _7 Total GAD 7 Score _8 Anxiety Difficulty Somewhat difficult Not difficult at all Somewhat difficult Somewhat difficult   Meningioma: Followed by neurosurgery.  He was last seen in June 2021 and they advised a follow-up in 3 years with a repeat brain MRI.  New complaints: None   Social history:  Relevant past medical, surgical, family and social history reviewed and updated as indicated. Interim medical history since our last visit reviewed.  Allergies and medications reviewed and updated.  DATA REVIEWED: CHART IN EPIC  ROS: Negative unless specifically indicated above in HPI.    Current Outpatient Medications:    albuterol (VENTOLIN HFA) 108 (90 Base) MCG/ACT inhaler, INHALE 2 PUFFS INTO THE LUNGS EVERY 6 HOURS AS NEEDED FOR WHEEZING OR SHORTNESS OF BREATH, Disp: 8.5 g, Rfl: 1   amLODipine (NORVASC) 5 MG tablet, TAKE 1 TABLET DAILY, Disp: 90 tablet, Rfl: 0   apixaban (ELIQUIS) 5 MG TABS tablet, Take 1 tablet (5 mg total) by mouth 2 (two) times daily., Disp: 180 tablet, Rfl: 3   dofetilide (TIKOSYN) 500 MCG capsule, Take 1 capsule (500 mcg total) by mouth 2 (two) times daily.,  Disp: 60 capsule, Rfl: 3   escitalopram (LEXAPRO) 20 MG tablet, Take 1 tablet (20 mg total) by mouth daily., Disp: 90 tablet, Rfl: 1   Ferrous Sulfate (IRON) 325 (65 Fe) MG TABS, Take 1 tablet (325 mg total) by mouth daily., Disp: 180 tablet, Rfl: 2   furosemide (LASIX) 20 MG tablet, TAKE ONE TABLET ONCE DAILY, Disp: 90 tablet, Rfl: 0   gabapentin (NEURONTIN) 300 MG capsule, Take 300 mg by mouth three times daily, Disp: 360 capsule, Rfl: 1   lisinopril (ZESTRIL) 5 MG tablet, TAKE 1 TABLET ONCE DAILY, Disp: 90 tablet, Rfl: 0   mirtazapine (REMERON) 7.5 MG tablet, Take 1 tablet (7.5 mg total) by mouth at bedtime., Disp: 90 tablet, Rfl: 1   pantoprazole (PROTONIX) 40 MG tablet, TAKE ONE TABLET TWICE DAILY, Disp: 180 tablet, Rfl: 0   pravastatin (PRAVACHOL) 40 MG tablet, TAKE ONE TABLET ONCE DAILY, Disp: 90 tablet, Rfl: 0   sucralfate (CARAFATE) 1 g tablet, Take 1  tablet (1 g total) by mouth 2 (two) times daily., Disp: 180 tablet, Rfl: 1   tamsulosin (FLOMAX) 0.4 MG CAPS capsule, TAKE ONE CAPSULE ONCE DAILY, Disp: 90 capsule, Rfl: 0   vitamin B-12 (CYANOCOBALAMIN) 1000 MCG tablet, Take 1 tablet (1,000 mcg total) by mouth daily., Disp: 30 tablet, Rfl: 2   Allergies  Allergen Reactions   Hydrocodone-Acetaminophen     Passed out and got real weak   Morphine And Related Other (See Comments)    Reaction:  Confusion/weakness/hallucinations   Other Other (See Comments)    Reaction:  Confusion/weakness/hallucinations   Valsartan Rash   Past Medical History:  Diagnosis Date   AAA (abdominal aortic aneurysm) ([place])    Brain tumor ([place])    x2   Cardiac arrhythmia    CHF (congestive heart failure) ([place])    Guillain-Barre syndrome ([place]) Feb 13 1986   Hypertension    Kidney stones    Myocardial infarction ([place]) 2007    Past Surgical History:  Procedure Laterality Date   COLONOSCOPY WITH PROPOFOL N/A 08/14/2020   Procedure: COLONOSCOPY WITH PROPOFOL;  Surgeon: Rogene Houston, MD;  [facility];  Service: Endoscopy;  Laterality: N/A;   CORONARY ANGIOPLASTY  1997   after mi   CYSTOSCOPY WITH RETROGRADE PYELOGRAM, URETEROSCOPY AND STENT PLACEMENT Left 06/28/2014   Procedure: 1ST STAGE CYSTOSCOPY/URETEROSCOPY/STENT PLACEMENT;  Surgeon: Alexis Frock, MD;  [facility]: [facility];  Service: Urology;  Laterality: Left;   CYSTOSCOPY WITH STENT PLACEMENT Left 05/08/2014   Procedure: CYSTOSCOPY, RETROGRADE PYELOGRAM WITH LEFT URETERAL STENT PLACEMENT;  Surgeon: Alexis Frock, MD;  [facility];  Service: Urology;  Laterality: Left;   ENTEROSCOPY N/A 09/16/2020   Procedure: PUSH ENTEROSCOPY;  Surgeon: Harvel Quale, MD;  [facility];  Service: Gastroenterology;  Laterality: N/A;   ENTEROSCOPY N/A 10/25/2020   Procedure: PUSH ENTEROSCOPY;  Surgeon: Harvel Quale, MD;  [facility];  Service: Gastroenterology;  Laterality: N/A;   ESOPHAGOGASTRODUODENOSCOPY (EGD) WITH PROPOFOL N/A 12/16/2019   Procedure: ESOPHAGOGASTRODUODENOSCOPY (EGD) WITH PROPOFOL;  Surgeon: Mauri Pole, MD;  [facility];  Service: Endoscopy;  Laterality: N/A;   ESOPHAGOGASTRODUODENOSCOPY (EGD) WITH PROPOFOL N/A 08/13/2020   Procedure: ESOPHAGOGASTRODUODENOSCOPY (EGD) WITH PROPOFOL;  Surgeon: Rogene Houston, MD;  [facility];  Service: Endoscopy;  Laterality: N/A;   ESOPHAGOGASTRODUODENOSCOPY (EGD) WITH PROPOFOL N/A 09/16/2020   Procedure: ESOPHAGOGASTRODUODENOSCOPY (EGD) WITH PROPOFOL;  Surgeon: Harvel Quale, MD;  [facility];  Service: Gastroenterology;  Laterality: N/A;  1:55   ESOPHAGOGASTRODUODENOSCOPY (EGD) WITH PROPOFOL N/A 10/25/2020   Procedure: ESOPHAGOGASTRODUODENOSCOPY (EGD) WITH PROPOFOL;  Surgeon: Harvel Quale, MD;  [facility];  Service: Gastroenterology;  Laterality: N/A;  2:00   Theodore SURGERY Right may 2015   growth removed, july 2015left Theodore cataract removed, right Theodore catarct removed    gamma kniferadiation treatment  Feb 09 2014   [facility]   GIVENS CAPSULE STUDY N/A 10/11/2020   Procedure: GIVENS CAPSULE STUDY;  Surgeon: Harvel Quale, MD;  [facility];  Service: Gastroenterology;  Laterality: N/A;  7:30   HEMOSTASIS CLIP PLACEMENT  12/16/2019   Procedure: HEMOSTASIS CLIP PLACEMENT;  Surgeon: Mauri Pole, MD;  [facility];  Service: Endoscopy;;   HEMOSTASIS CONTROL  12/16/2019   Procedure: HEMOSTASIS CONTROL;  Surgeon: Mauri Pole, MD;  [facility];  Service: Endoscopy;;  Endoloop   HOLMIUM LASER APPLICATION Left 6/96/2952   Procedure: HOLMIUM  LASER APPLICATION;  Surgeon: Alexis Frock, MD;  [facility];  Service: Urology;  Laterality: Left;   LITHOTRIPSY  years ago   POLYPECTOMY  12/16/2019   Procedure: POLYPECTOMY;  Surgeon: Mauri Pole, MD;  [facility];  Service: Endoscopy;;   STONE EXTRACTION WITH BASKET Left 06/28/2014   Procedure: STONE EXTRACTION WITH BASKET;  Surgeon: Alexis Frock, MD;  [facility];  Service: Urology;  Laterality: Left;    Social History   Socioeconomic History   Marital status: Married    Spouse name: Enid Derry   Number of children: 1   Years of education: Not on file   Highest education level: Not on file  Occupational History   Occupation: retired   Tobacco Use   Smoking status: Never   Smokeless tobacco: Never  Vaping Use   Vaping Use: Never used  Substance and Sexual Activity   Alcohol use: No   Drug use: No   Sexual activity: Not Currently  Other Topics Concern   Not on file  Social History Narrative   Lives with wife - they have a basement but don't use it   One son - he's a Environmental education officer and they go to his church   Patient is paraplegic - uses wheelchair, sometimes motorized   He does still drive using hand controls.   Social Determinants of Health   Financial Resource Strain: Medium Risk (05/24/2021)   Overall Financial  Resource Strain (CARDIA)    Difficulty of Paying Living Expenses: Somewhat hard  Food Insecurity: Food Insecurity Present (05/24/2021)   Hunger Vital Sign    Worried About Running Out of Food in the Last Year: Sometimes true    Ran Out of Food in the Last Year: Never true  Transportation Needs: No Transportation Needs (05/24/2021)   PRAPARE - Hydrologist (Medical): No    Lack of Transportation (Non-Medical): No  Physical Activity: Insufficiently Active (05/24/2021)   Exercise Vital Sign    Days of Exercise per Week: 7 days    Minutes of Exercise per Session: 20 min  Stress: No Stress Concern Present (05/24/2021)   [place]    Feeling of Stress : Not at all  Social Connections: [place] (05/24/2021)   Social Connection and Isolation Panel [NHANES]    Frequency of Communication with Friends and Family: More than three times a week    Frequency of Social Gatherings with Friends and Family: More than three times a week    Attends Religious Services: More than 4 times per year    Active Member of [employer] or Organizations: Yes    Attends Archivist Meetings: More than 4 times per year    Marital Status: Married  Human resources officer Violence: Not At Risk (05/24/2021)   Humiliation, Afraid, Rape, and Kick questionnaire    Fear of Current or Ex-Partner: No    Emotionally Abused: No    Physically Abused: No    Sexually Abused: No        Objective:    BP (!) 157/76   Pulse (!) 51   Temp 98 F (36.7 C) (Temporal)   Wt Readings from Last 3 Encounters:  05/24/21 150 lb (68 kg)  04/24/21 150 lb (68 kg)  10/21/20 150 lb (68 kg)    Physical Exam Vitals reviewed.  Constitutional:      General: He is not in acute distress.    Appearance: Normal appearance. He  is overweight. He is not ill-appearing, toxic-appearing or diaphoretic.  HENT:     Head: Normocephalic and atraumatic.   Eyes:     General: No scleral icterus.       Right Theodore: No discharge.        Left Theodore: No discharge.     Conjunctiva/sclera: Conjunctivae normal.  Cardiovascular:     Rate and Rhythm: Regular rhythm.     Heart sounds: Normal heart sounds. No murmur heard.   No friction rub. No gallop.  Pulmonary:     Effort: Pulmonary effort is normal. No respiratory distress.     Breath sounds: Normal breath sounds. No stridor. No wheezing, rhonchi or rales.  Musculoskeletal:        General: Normal range of motion.     Cervical back: Normal range of motion.     Comments: Paralysis from the waist down.  Skin:    General: Skin is warm and dry.  Neurological:     Mental Status: He is alert and oriented to person, place, and time. Mental status is at baseline.     Gait: Gait abnormal (rides in [facility]).  Psychiatric:        Mood and Affect: Mood normal.        Behavior: Behavior normal.        Thought Content: Thought content normal.        Judgment: Judgment normal.    Lab Results  Component Value Date   TSH 3.590 05/06/2020   Lab Results  Component Value Date   WBC 5.4 06/02/2021   HGB 12.5 (L) 06/02/2021   HCT 38.3 06/02/2021   MCV 88 06/02/2021   PLT 169 06/02/2021   Lab Results  Component Value Date   NA 146 (H) 06/02/2021   K 4.2 06/02/2021   CO2 28 06/02/2021   GLUCOSE 86 06/02/2021   BUN 14 06/02/2021   CREATININE 0.72 (L) 06/02/2021   BILITOT 0.5 06/02/2021   ALKPHOS 66 06/02/2021   AST 13 06/02/2021   ALT 9 06/02/2021   PROT 5.6 (L) 06/02/2021   ALBUMIN 4.0 06/02/2021   CALCIUM 9.1 06/02/2021   ANIONGAP 5 10/21/2020   EGFR 91 06/02/2021   Lab Results  Component Value Date   CHOL 126 06/02/2021   Lab Results  Component Value Date   HDL 49 06/02/2021   Lab Results  Component Value Date   LDLCALC 66 06/02/2021   Lab Results  Component Value Date   TRIG 48 06/02/2021   Lab Results  Component Value Date   CHOLHDL 2.6 06/02/2021   Lab Results  Component  Value Date   HGBA1C 4.7 05/06/2020

## 2021-10-02 DIAGNOSIS — K219 Gastro-esophageal reflux disease without esophagitis: Secondary | ICD-10-CM | POA: Insufficient documentation

## 2021-10-02 DIAGNOSIS — Z596 Low income: Secondary | ICD-10-CM | POA: Insufficient documentation

## 2021-10-02 NOTE — Assessment & Plan Note (Signed)
Stable. Followed by vascular, who he is suppose to see next month.

## 2021-10-02 NOTE — Assessment & Plan Note (Signed)
Well-controlled on current regimen. ?

## 2021-10-02 NOTE — Assessment & Plan Note (Signed)
Patient does well caring for himself. Declines vaccines.

## 2021-10-02 NOTE — Assessment & Plan Note (Signed)
Continue Pravastatin and Eliquis.

## 2021-10-02 NOTE — Assessment & Plan Note (Signed)
We assist with prescription assistance of Tikosyn so I did tell him about [place] where he can get a 30-day supply for $10.80. He will see if his daughter will set him up an account since he does not have an e-mail address or use a computer.

## 2021-10-02 NOTE — Assessment & Plan Note (Signed)
Well controlled on current regimen. Managed by cardiology. We assist with prescription assistance of Tikosyn so I did tell him about [place] where he can get a 30-day supply for $10.80. He will see if his daughter will set him up an account since he does not have an e-mail address or use a computer.

## 2021-10-02 NOTE — Assessment & Plan Note (Signed)
Taking Eliquis. 

## 2021-10-02 NOTE — Assessment & Plan Note (Signed)
Followed by neurosurgery who he does not have to see again until 2024.

## 2021-10-02 NOTE — Assessment & Plan Note (Signed)
Patient does well caring for himself. Has a wheelchair.

## 2021-10-06 MED ORDER — DOFETILIDE 500 MCG PO CAPS: 500.0000 ug | ORAL_CAPSULE | Freq: Two times a day (BID) | ORAL | 5 refills | Status: DC

## 2021-10-06 NOTE — Addendum Note (Signed)
Addended by: [name] F on: 10/06/2021 11:25 AM   Modules accepted: Orders

## 2021-10-12 ENCOUNTER — Other Ambulatory Visit (INDEPENDENT_AMBULATORY_CARE_PROVIDER_SITE_OTHER): Payer: Self-pay | Admitting: Gastroenterology

## 2021-10-12 NOTE — Telephone Encounter (Signed)
Will refill medication for 3 months, needs follow up appointment with any provider in order to receive any refills. He can also ask PCP to refill this.  Thanks,  Maylon Peppers, MD Gastroenterology and Hepatology [facility]

## 2021-10-12 NOTE — Telephone Encounter (Signed)
Small bowel endoscopy on 10/25/20 Last office visit 09/05/20 No upcoming appt

## 2021-10-31 ENCOUNTER — Other Ambulatory Visit: Payer: Self-pay | Admitting: Family Medicine

## 2021-10-31 DIAGNOSIS — R601 Generalized edema: Secondary | ICD-10-CM

## 2021-10-31 DIAGNOSIS — I10 Essential (primary) hypertension: Secondary | ICD-10-CM

## 2021-11-08 ENCOUNTER — Encounter: Payer: Self-pay | Admitting: *Deleted

## 2021-11-13 ENCOUNTER — Telehealth: Payer: Self-pay | Admitting: Family Medicine

## 2021-11-13 NOTE — Telephone Encounter (Signed)
No samples patient aware. 

## 2021-11-21 ENCOUNTER — Other Ambulatory Visit: Payer: Self-pay | Admitting: Family Medicine

## 2021-11-21 DIAGNOSIS — K219 Gastro-esophageal reflux disease without esophagitis: Secondary | ICD-10-CM

## 2021-11-21 DIAGNOSIS — Z8673 Personal history of transient ischemic attack (TIA), and cerebral infarction without residual deficits: Secondary | ICD-10-CM

## 2021-11-21 DIAGNOSIS — E782 Mixed hyperlipidemia: Secondary | ICD-10-CM

## 2021-11-21 DIAGNOSIS — I714 Abdominal aortic aneurysm, without rupture, unspecified: Secondary | ICD-10-CM

## 2021-11-21 DIAGNOSIS — I252 Old myocardial infarction: Secondary | ICD-10-CM

## 2021-11-28 DIAGNOSIS — K529 Noninfective gastroenteritis and colitis, unspecified: Secondary | ICD-10-CM | POA: Diagnosis not present

## 2021-11-28 DIAGNOSIS — R110 Nausea: Secondary | ICD-10-CM | POA: Diagnosis not present

## 2021-11-29 DIAGNOSIS — D485 Neoplasm of uncertain behavior of skin: Secondary | ICD-10-CM | POA: Diagnosis not present

## 2021-11-29 DIAGNOSIS — C44622 Squamous cell carcinoma of skin of right upper limb, including shoulder: Secondary | ICD-10-CM | POA: Diagnosis not present

## 2021-12-05 ENCOUNTER — Other Ambulatory Visit: Payer: Self-pay | Admitting: Family Medicine

## 2021-12-05 DIAGNOSIS — K219 Gastro-esophageal reflux disease without esophagitis: Secondary | ICD-10-CM

## 2021-12-06 ENCOUNTER — Ambulatory Visit: Payer: Medicare Other | Admitting: Family Medicine

## 2021-12-06 ENCOUNTER — Encounter: Payer: Self-pay | Admitting: Family Medicine

## 2021-12-07 ENCOUNTER — Encounter: Payer: Self-pay | Admitting: Family Medicine

## 2021-12-07 ENCOUNTER — Telehealth: Payer: Self-pay | Admitting: Family Medicine

## 2021-12-07 ENCOUNTER — Ambulatory Visit (INDEPENDENT_AMBULATORY_CARE_PROVIDER_SITE_OTHER): Payer: Medicare Other | Admitting: Family Medicine

## 2021-12-07 VITALS — BP 135/69 | HR 58 | Temp 98.1°F | Resp 20 | Ht 68.0 in | Wt 150.0 lb

## 2021-12-07 DIAGNOSIS — G8222 Paraplegia, incomplete: Secondary | ICD-10-CM | POA: Diagnosis not present

## 2021-12-07 NOTE — Telephone Encounter (Signed)
Called and aware of referral

## 2021-12-07 NOTE — Progress Notes (Signed)
Assessment & Plan:  1. Paraplegia, incomplete ([place]) - Ambulatory referral for Orthotics   Follow up plan: Return as scheduled.  [name], MSN, APRN, FNP-C [facility]  Subjective:   Patient ID: Theodore Mendoza, male    DOB: December 06, 1940, 81 y.o.   MRN: [id number]  HPI: Theodore Mendoza is a 81 y.o. male presenting on 12/07/2021 for DME needs  (Needs braces for shoes - needs referral to [facility] )  Patient is in need of new braces for his shoes, which he can obtain at the [facility] as he has seen them in the past. He is only here to get a referral.    ROS: Negative unless specifically indicated above in HPI.   Relevant past medical history reviewed and updated as indicated.   Allergies and medications reviewed and updated.   Current Outpatient Medications:    albuterol (VENTOLIN HFA) 108 (90 Base) MCG/ACT inhaler, INHALE 2 PUFFS INTO THE LUNGS EVERY 6 HOURS AS NEEDED FOR WHEEZING OR SHORTNESS OF BREATH, Disp: 8.5 g, Rfl: 1   amLODipine (NORVASC) 5 MG tablet, TAKE 1 TABLET DAILY, Disp: 90 tablet, Rfl: 0   apixaban (ELIQUIS) 5 MG TABS tablet, Take 1 tablet (5 mg total) by mouth 2 (two) times daily., Disp: 180 tablet, Rfl: 3   dofetilide (TIKOSYN) 500 MCG capsule, Take 1 capsule (500 mcg total) by mouth 2 (two) times daily., Disp: 60 capsule, Rfl: 5   escitalopram (LEXAPRO) 20 MG tablet, Take 1 tablet (20 mg total) by mouth daily., Disp: 90 tablet, Rfl: 1   FEROSUL 325 (65 Fe) MG tablet, TAKE 1 TABLET DAILY, Disp: 90 tablet, Rfl: 0   furosemide (LASIX) 20 MG tablet, TAKE ONE TABLET ONCE DAILY, Disp: 90 tablet, Rfl: 0   gabapentin (NEURONTIN) 300 MG capsule, Take 300 mg by mouth three times daily, Disp: 360 capsule, Rfl: 1   lisinopril (ZESTRIL) 5 MG tablet, TAKE 1 TABLET ONCE DAILY, Disp: 90 tablet, Rfl: 0   mirtazapine (REMERON) 7.5 MG tablet, Take 1 tablet (7.5 mg total) by mouth at bedtime., Disp: 90 tablet, Rfl: 1   pantoprazole (PROTONIX) 40 MG  tablet, TAKE ONE TABLET TWICE DAILY, Disp: 180 tablet, Rfl: 0   pravastatin (PRAVACHOL) 40 MG tablet, TAKE ONE TABLET ONCE DAILY, Disp: 90 tablet, Rfl: 0   sucralfate (CARAFATE) 1 g tablet, TAKE ONE TABLET BY MOUTH TWICE DAILY, Disp: 180 tablet, Rfl: 0   tamsulosin (FLOMAX) 0.4 MG CAPS capsule, TAKE ONE CAPSULE ONCE DAILY, Disp: 90 capsule, Rfl: 0   vitamin B-12 (CYANOCOBALAMIN) 1000 MCG tablet, Take 1 tablet (1,000 mcg total) by mouth daily., Disp: 30 tablet, Rfl: 2  Allergies  Allergen Reactions   Hydrocodone-Acetaminophen     Passed out and got real weak   Morphine And Related Other (See Comments)    Reaction:  Confusion/weakness/hallucinations   Other Other (See Comments)    Reaction:  Confusion/weakness/hallucinations   Valsartan Rash    Objective:   BP 135/69   Pulse (!) 58   Temp 98.1 F (36.7 C)   Resp 20   Ht '5\' 8"'$  (1.727 m)   Wt 150 lb (68 kg)   SpO2 97%   BMI 22.81 kg/m    Physical Exam Vitals reviewed.  Constitutional:      General: He is not in acute distress.    Appearance: Normal appearance. He is overweight. He is not ill-appearing, toxic-appearing or diaphoretic.  HENT:     Head: Normocephalic and atraumatic.  Eyes:     General: No scleral icterus.       Right eye: No discharge.        Left eye: No discharge.     Conjunctiva/sclera: Conjunctivae normal.  Pulmonary:     Effort: Pulmonary effort is normal.     Breath sounds: No stridor.  Musculoskeletal:        General: Normal range of motion.     Cervical back: Normal range of motion.     Comments: Paralysis from the waist down.  Skin:    General: Skin is warm and dry.  Neurological:     Mental Status: He is alert and oriented to person, place, and time. Mental status is at baseline.     Gait: Gait abnormal (rides in [facility] [place]).  Psychiatric:        Mood and Affect: Mood normal.        Behavior: Behavior normal.        Thought Content: Thought content normal.        Judgment: Judgment normal.

## 2021-12-11 ENCOUNTER — Telehealth: Payer: Self-pay | Admitting: Family Medicine

## 2021-12-11 ENCOUNTER — Other Ambulatory Visit: Payer: Self-pay | Admitting: Family Medicine

## 2021-12-11 DIAGNOSIS — I10 Essential (primary) hypertension: Secondary | ICD-10-CM

## 2021-12-11 NOTE — Telephone Encounter (Signed)
Spoke with patient, he reports [facility] called about his referral and is needing more info

## 2021-12-19 ENCOUNTER — Other Ambulatory Visit: Payer: Self-pay | Admitting: Family Medicine

## 2021-12-19 DIAGNOSIS — F332 Major depressive disorder, recurrent severe without psychotic features: Secondary | ICD-10-CM

## 2021-12-19 DIAGNOSIS — F411 Generalized anxiety disorder: Secondary | ICD-10-CM

## 2021-12-28 DIAGNOSIS — L905 Scar conditions and fibrosis of skin: Secondary | ICD-10-CM | POA: Diagnosis not present

## 2021-12-28 DIAGNOSIS — C44622 Squamous cell carcinoma of skin of right upper limb, including shoulder: Secondary | ICD-10-CM | POA: Diagnosis not present

## 2022-01-02 ENCOUNTER — Other Ambulatory Visit: Payer: Self-pay | Admitting: Family Medicine

## 2022-01-07 IMAGING — CT CT CTA ABD/PEL W/CM AND/OR W/O CM
3 of 12 series · 10 of 46 positions shown, 15 images · IV contrast (omnipaque)
Comparison: CT of the abdomen and pelvis without contrast on
11/10/2015

CLINICAL DATA: Anemia and possible melena. Periumbilical pulsatile
abdominal mass on physical exam concerning for abdominal aortic
aneurysm which is not associated with acute abdominal pain or
tenderness.

EXAM:
CT ANGIOGRAPHY ABDOMEN AND PELVIS WITH CONTRAST AND WITHOUT CONTRAST
TECHNIQUE: Multidetector CT imaging of the abdomen and pelvis was performed
using the standard protocol during bolus administration of
intravenous contrast. Multiplanar reconstructed images and MIPs were
obtained and reviewed to evaluate the vascular anatomy.
CONTRAST:  100mL OMNIPAQUE IOHEXOL 350 MG/ML SOLN

[Series 6: axial arterial · axial · arterial · 0.72mm/px · z∈[-612,-390]mm · 5 of 245 slices shown]
[im 23/245  soft-tissue]
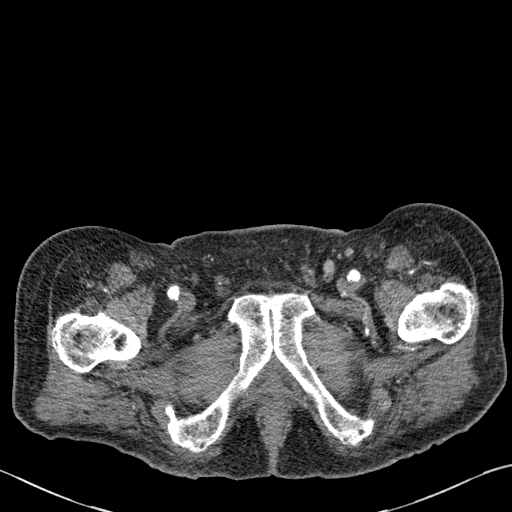
[im 45/245  soft-tissue]
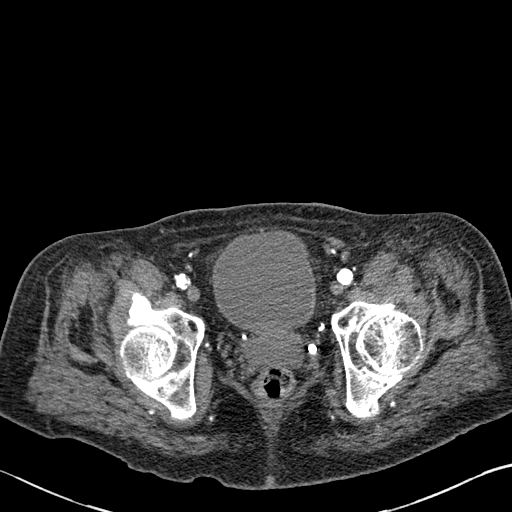
[im 89/245  soft-tissue]
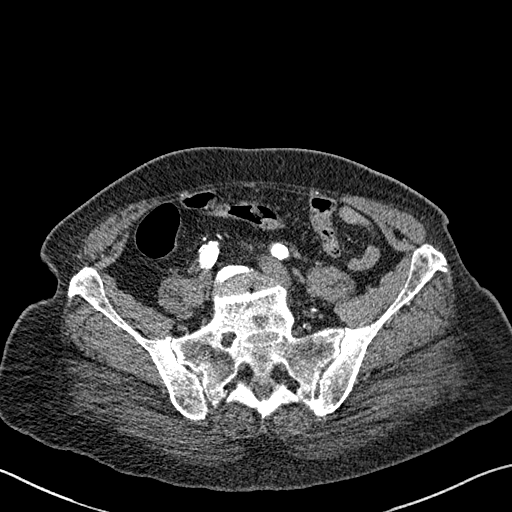
[im 111/245  soft-tissue]
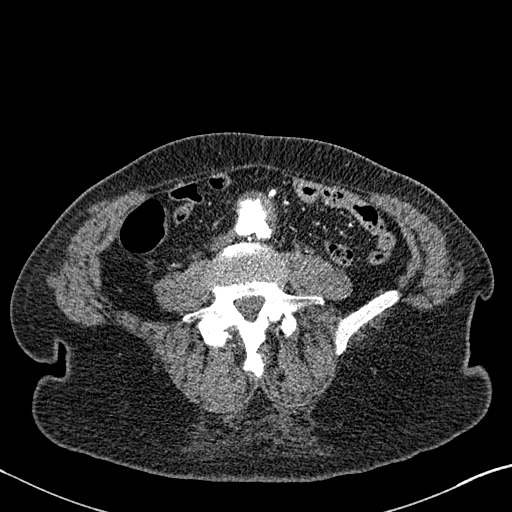
[im 134/245  soft-tissue]
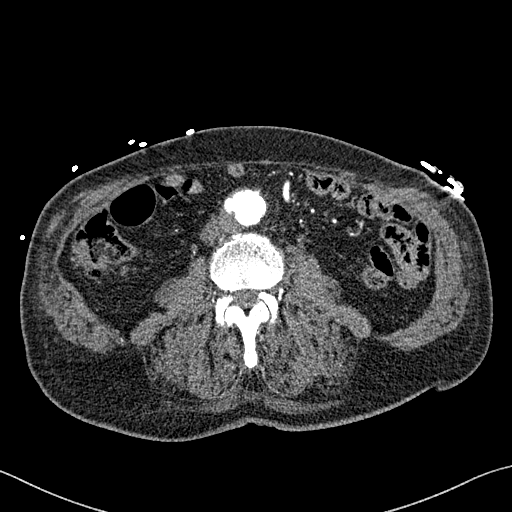

[Series 9: coronal arterial · coronal · arterial · 0.72mm/px · 2 of 152 slices shown, 3 images]
[im 51/152  soft-tissue]
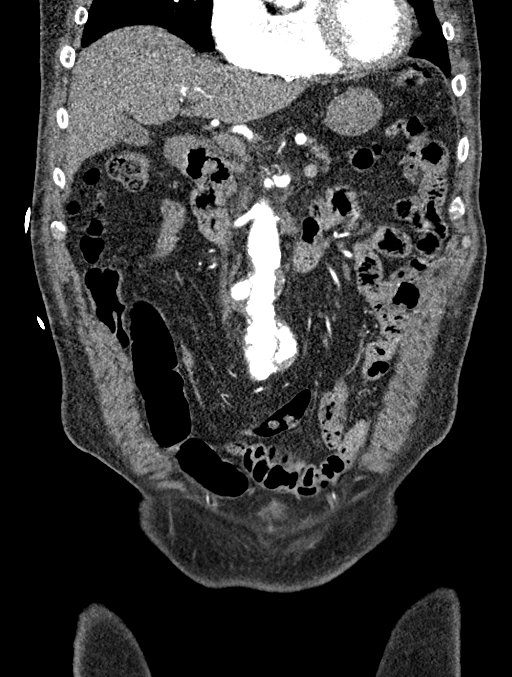
[im 51/152  bone]
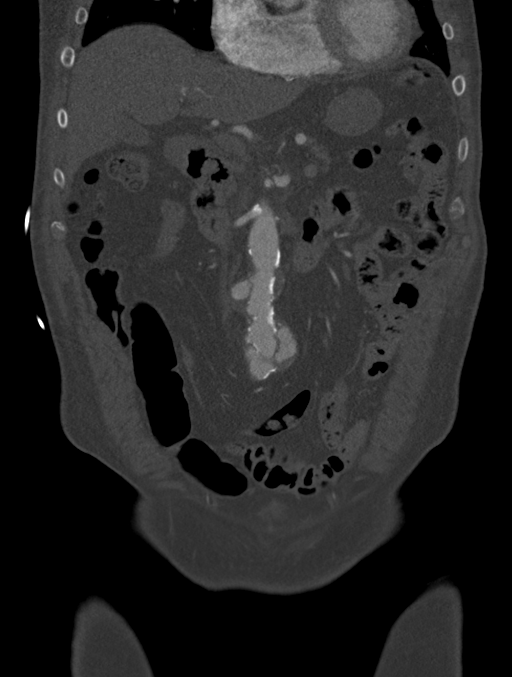
[im 101/152  soft-tissue]
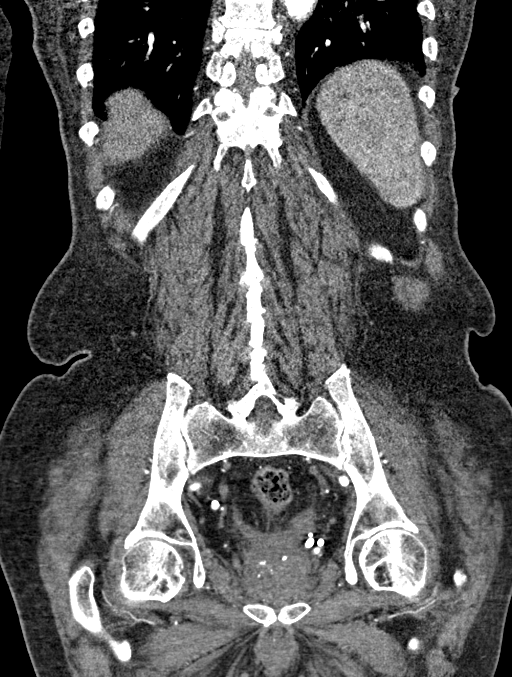

[Series 13: axial portal venous · axial · portal-venous · 0.74mm/px · z∈[-533,-293]mm · 3 of 98 slices shown, 7 images]
[im 25/98  soft-tissue]
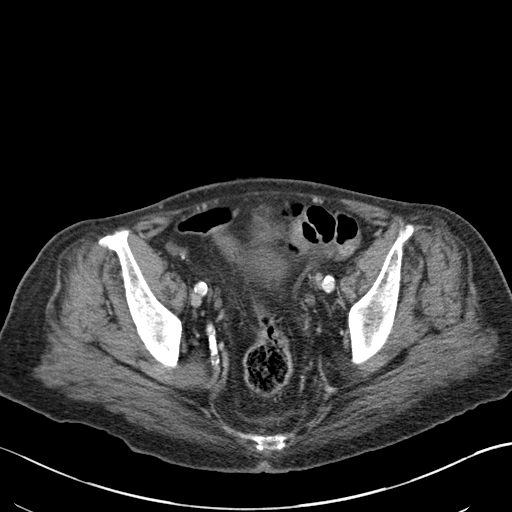
[im 25/98  lung]
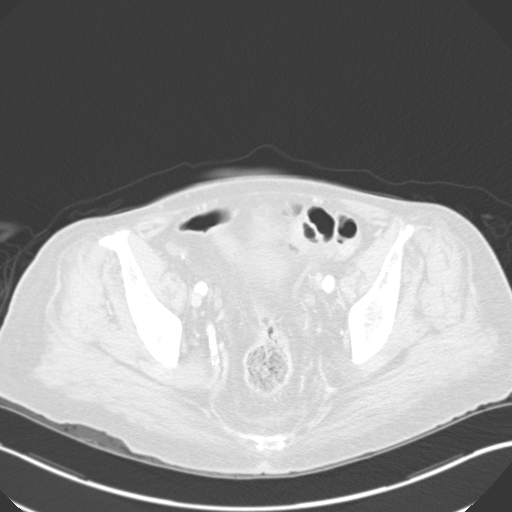
[im 25/98  bone]
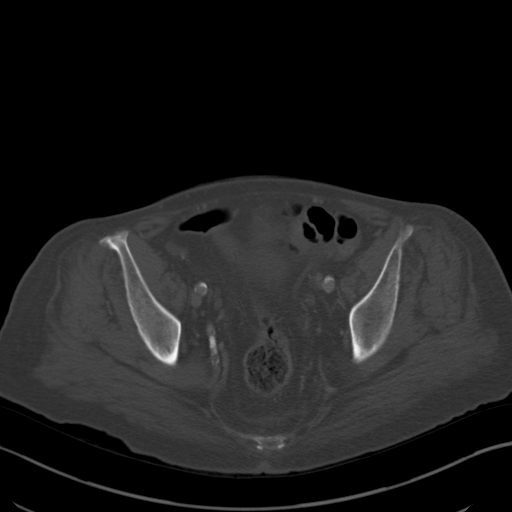
[im 49/98  soft-tissue]
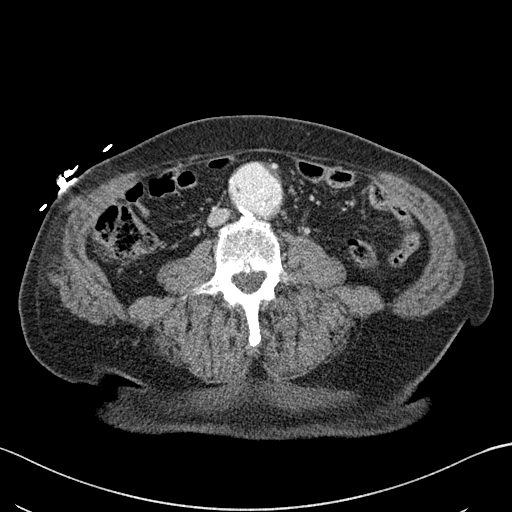
[im 49/98  lung]
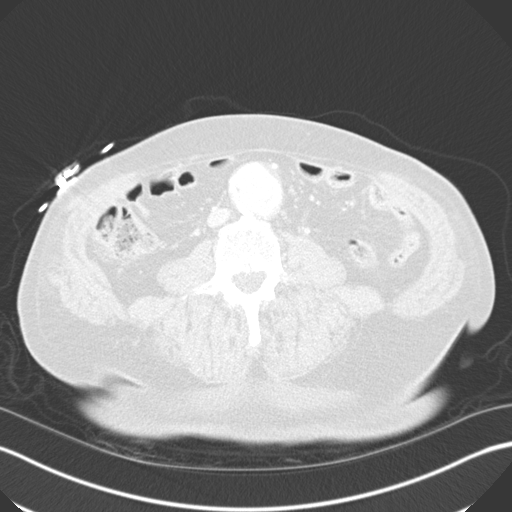
[im 73/98  soft-tissue]
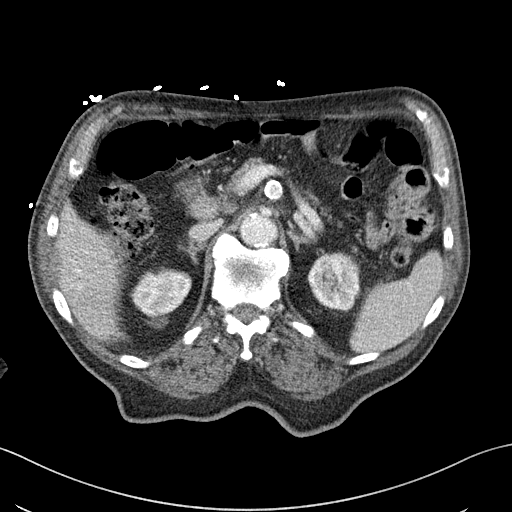
[im 73/98  lung]
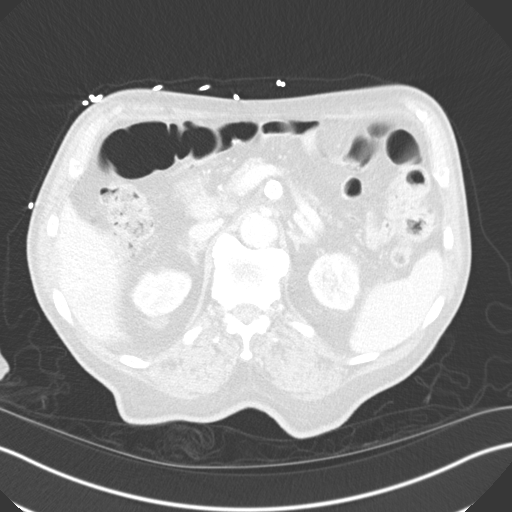

[10 of 46 positions shown; findings below may reference images not displayed]

FINDINGS: VASCULAR

Aorta: Since the prior unenhanced CT, there is progressive
enlargement of the infrarenal abdominal aorta with multiple areas of
ulcerated plaque and penetrating ulcer disease. Beginning below the
level of the renal arteries, prominent penetrating ulcer disease is
seen along the right lateral wall of the abdominal aorta causing
dilatation of 4 cm. Below the IMA origin, there is a large
circumferential contained extrusion of contrast material consistent
with a very large ulcerated plaque causing a broad penetrating
ulcer. Maximal aortic diameter at this level is 4.5 cm with maximal
transverse diameter of 4.1 cm. This represents enlargement since the
prior unenhanced scan in 5619 at which time maximal aortic diameter
was approximately 3.4 cm. Penetrating ulcer disease extends to the
aortic bifurcation. No evidence of overt aneurysm rupture.

Celiac: Stenosis of the proximal celiac trunk approaching 70% in
maximal caliber with associated poststenotic dilatation of
approximately 11-12 mm. Distal branches are patent.

SMA: Proximal calcified plaque present with maximal narrowing of
approximately 30%.

Renals: Bilateral single renal arteries. Calcified plaque at the
origins of both renal arteries cause moderate range stenoses
estimated to be approximately 50-70% bilaterally, likely more
significant on the left compared to the right.

IMA: Patent.

Inflow: Calcified plaque throughout the iliac arteries bilaterally.
Component of ulcerated plaque at the origin of the right common
iliac artery. No significant iliac aneurysmal disease. Focal
stenosis of the proximal to mid left external iliac artery appears
significant and is likely approaching 70-80% in caliber. Stenosis of
the distal left external iliac artery of approximately 40%.

Proximal Outflow: Atherosclerosis of bilateral common femoral
arteries without evidence of significant stenosis or aneurysmal
disease. Femoral bifurcations demonstrate occlusion of the proximal
right SFA and high-grade stenosis of the proximal left SFA.

Review of the MIP images confirms the above findings.

NON-VASCULAR

Lower chest: No acute abnormality.

Hepatobiliary: No focal liver abnormality is seen. No gallstones,
gallbladder wall thickening, or biliary dilatation.

Pancreas: Unremarkable. No pancreatic ductal dilatation or
surrounding inflammatory changes.

Spleen: Normal in size without focal abnormality.

Adrenals/Urinary Tract: Adrenal glands are unremarkable. Kidneys
demonstrate no hydronephrosis. Lower pole renal cyst on the right
seen previously in 5619 demonstrates slight enlargement and interval
development of some partial rim calcification. The bladder is
unremarkable.

Stomach/Bowel: Bowel shows no evidence obstruction, ileus, lesion or
inflammation. No free intraperitoneal air identified.

Lymphatic: No enlarged abdominal or pelvic lymph nodes.

Reproductive: Prostate is unremarkable.

Other: No abdominal wall hernia or abnormality. No abdominopelvic
ascites.

Musculoskeletal: No fracture is seen.
IMPRESSION: 1. Significant interval enlargement of the infrarenal abdominal
aorta with multiple areas of ulcerated plaque and penetrating ulcer
disease present. There is a large circumferential contained
extrusion of contrast material along the anterior margin of the
distal abdominal aorta consistent with a very large ulcerated plaque
causing a broad penetrating ulcer. Maximal aortic diameter at this
level is 4.5 cm with maximal transverse diameter of 4.1 cm. No
evidence of overt aneurysm rupture. The aorta measured approximately
3.4 cm in 5619. Given interval aortic enlargement as well as
potential unstable nature of plaque and penetrating ulcer disease,
recommend elective referral to Vascular Surgery for assessment of
aortic treatment options.
2. Moderate stenosis at the origins of both renal arteries estimated
to be approximately 50-70% bilaterally, likely more significant on
the left compared to the right.
3. Significant stenosis of the proximal to mid left external iliac
artery approaching 70-80% in caliber.
4. Occlusion of the proximal right SFA and high-grade stenosis of
the proximal left SFA.
5. Moderate stenosis of the proximal celiac trunk approaching 70% in
maximal caliber.
6. Recommend referral to a vascular specialist. This recommendation
follows ACR consensus guidelines: White Paper of the ACR Incidental

## 2022-01-10 DIAGNOSIS — C4441 Basal cell carcinoma of skin of scalp and neck: Secondary | ICD-10-CM | POA: Diagnosis not present

## 2022-01-10 DIAGNOSIS — D485 Neoplasm of uncertain behavior of skin: Secondary | ICD-10-CM | POA: Diagnosis not present

## 2022-01-10 DIAGNOSIS — L820 Inflamed seborrheic keratosis: Secondary | ICD-10-CM | POA: Diagnosis not present

## 2022-01-10 DIAGNOSIS — L570 Actinic keratosis: Secondary | ICD-10-CM | POA: Diagnosis not present

## 2022-01-11 ENCOUNTER — Encounter: Payer: Self-pay | Admitting: Family Medicine

## 2022-01-11 ENCOUNTER — Ambulatory Visit (INDEPENDENT_AMBULATORY_CARE_PROVIDER_SITE_OTHER): Payer: Medicare Other | Admitting: Family Medicine

## 2022-01-11 VITALS — BP 152/72 | HR 60 | Temp 97.9°F

## 2022-01-11 DIAGNOSIS — Z8673 Personal history of transient ischemic attack (TIA), and cerebral infarction without residual deficits: Secondary | ICD-10-CM

## 2022-01-11 DIAGNOSIS — F332 Major depressive disorder, recurrent severe without psychotic features: Secondary | ICD-10-CM | POA: Diagnosis not present

## 2022-01-11 DIAGNOSIS — I714 Abdominal aortic aneurysm, without rupture, unspecified: Secondary | ICD-10-CM

## 2022-01-11 DIAGNOSIS — E782 Mixed hyperlipidemia: Secondary | ICD-10-CM

## 2022-01-11 DIAGNOSIS — Z7901 Long term (current) use of anticoagulants: Secondary | ICD-10-CM

## 2022-01-11 DIAGNOSIS — F411 Generalized anxiety disorder: Secondary | ICD-10-CM | POA: Diagnosis not present

## 2022-01-11 DIAGNOSIS — D329 Benign neoplasm of meninges, unspecified: Secondary | ICD-10-CM

## 2022-01-11 DIAGNOSIS — G8222 Paraplegia, incomplete: Secondary | ICD-10-CM

## 2022-01-11 DIAGNOSIS — I10 Essential (primary) hypertension: Secondary | ICD-10-CM | POA: Diagnosis not present

## 2022-01-11 DIAGNOSIS — R601 Generalized edema: Secondary | ICD-10-CM

## 2022-01-11 DIAGNOSIS — K219 Gastro-esophageal reflux disease without esophagitis: Secondary | ICD-10-CM

## 2022-01-11 DIAGNOSIS — I252 Old myocardial infarction: Secondary | ICD-10-CM

## 2022-01-11 MED ORDER — ESCITALOPRAM OXALATE 20 MG PO TABS: 20.0000 mg | ORAL_TABLET | Freq: Every day | ORAL | 3 refills | Status: DC

## 2022-01-11 MED ORDER — TAMSULOSIN HCL 0.4 MG PO CAPS: 0.4000 mg | ORAL_CAPSULE | Freq: Every day | ORAL | 3 refills | Status: DC

## 2022-01-11 MED ORDER — MIRTAZAPINE 7.5 MG PO TABS: 7.5000 mg | ORAL_TABLET | Freq: Every day | ORAL | 3 refills | Status: DC

## 2022-01-11 MED ORDER — PRAVASTATIN SODIUM 40 MG PO TABS: 40.0000 mg | ORAL_TABLET | Freq: Every day | ORAL | 3 refills | Status: DC

## 2022-01-11 MED ORDER — FUROSEMIDE 20 MG PO TABS: 20.0000 mg | ORAL_TABLET | Freq: Every day | ORAL | 3 refills | Status: DC

## 2022-01-11 MED ORDER — AMLODIPINE BESYLATE 5 MG PO TABS: 5.0000 mg | ORAL_TABLET | Freq: Every day | ORAL | 3 refills | Status: DC

## 2022-01-11 MED ORDER — SUCRALFATE 1 G PO TABS: 1.0000 g | ORAL_TABLET | Freq: Two times a day (BID) | ORAL | 3 refills | Status: DC

## 2022-01-11 MED ORDER — PANTOPRAZOLE SODIUM 40 MG PO TBEC: 40.0000 mg | DELAYED_RELEASE_TABLET | Freq: Two times a day (BID) | ORAL | 3 refills | Status: DC

## 2022-01-11 MED ORDER — LISINOPRIL 5 MG PO TABS: 5.0000 mg | ORAL_TABLET | Freq: Every day | ORAL | 3 refills | Status: DC

## 2022-01-11 NOTE — Progress Notes (Signed)
Subjective:  Patient ID: Theodore Mendoza, male    DOB: May 20, 1940  Age: 81 y.o. MRN: [id number]  CC: Medical Management of Chronic Issues   HPI Jessie Schrieber Guay presents for initial visit. WC bound due to [employer]. Hospitalized for a year in 4. Disables ever since. Can't flex fingers or toes. Needing new braces due to flaccid paralysis of feet and ankles - foot drops without braces. He can do transfers with braces. Went to [facility] [employer] was told they needed information about why he needs braces. Gets depressed due to lack of ability to do things. Has a power wheelchair to help him get around at home.   PT. Denies having had a stroke. He has a meningioma, but is unaware of any problems it is causing.   He had an MI in 2007.   Atrial fibrillation follow up. Pt. is treated with rate control and anticoagulation. Pt.  denies palpitations, rapid rate, chest pain, dyspnea and edema. There has been no bleeding from nose or gums. Pt. has not noticed blood with urine or stool.  Although there is routine bruising easily, it is not excessive.     01/11/2022   10:06 AM 09/29/2021    9:59 AM 06/02/2021   10:05 AM 05/24/2021    2:08 PM 02/01/2021    4:11 PM  Depression screen PHQ 2/9  Decreased Interest 1 0 '2 1 2  '$ Down, Depressed, Hopeless '1 1 2 1 1  '$ PHQ - 2 Score '2 1 4 2 3  '$ Altered sleeping 3 0 '3 1 1  '$ Tired, decreased energy '3 3 3 2 3  '$ Change in appetite 2 0 '1 1 2  '$ Feeling bad or failure about yourself  1 1 0 1 2  Trouble concentrating 0 0 0 1 3  Moving slowly or fidgety/restless 0 2 0 0 2  Suicidal thoughts 1 0 0 0 1  PHQ-9 Score '12 7 11 8 17  '$ Difficult doing work/chores Somewhat difficult Not difficult at all Not difficult at all Somewhat difficult Not difficult at all         01/11/2022   10:06 AM 09/29/2021    9:59 AM 06/02/2021   10:05 AM  Depression screen PHQ 2/9  Decreased Interest 1 0 2  Down, Depressed, Hopeless '1 1 2  '$ PHQ - 2 Score '2 1 4  '$ Altered sleeping 3 0 3  Tired,  decreased energy '3 3 3  '$ Change in appetite 2 0 1  Feeling bad or failure about yourself  1 1 0  Trouble concentrating 0 0 0  Moving slowly or fidgety/restless 0 2 0  Suicidal thoughts 1 0 0  PHQ-9 Score '12 7 11  '$ Difficult doing work/chores Somewhat difficult Not difficult at all Not difficult at all    History Kamar has a past medical history of AAA (abdominal aortic aneurysm) ([place]), Brain tumor [facility]), Cardiac arrhythmia, CHF (congestive heart failure) ([place]), Guillain-Barre syndrome ([place]) (Feb 13 1986), Hypertension, Kidney stones, and Myocardial infarction ([address]) (2007).   He has a past surgical history that includes Cystoscopy with stent placement (Left, 05/08/2014); Coronary angioplasty (1997); Eye surgery (Right, may 2015); gamma kniferadiation treatment (Feb 09 2014); Lithotripsy (years ago); Cystoscopy with retrograde pyelogram, ureteroscopy and stent placement (Left, 06/28/2014); Holmium laser application (Left, 1/85/6314); Stone extraction with basket (Left, 06/28/2014); Esophagogastroduodenoscopy (egd) with propofol (N/A, 12/16/2019); Hemostasis clip placement (12/16/2019); Hemostasis control (12/16/2019); polypectomy (12/16/2019); Esophagogastroduodenoscopy (egd) with propofol (N/A, 08/13/2020); Colonoscopy with propofol (N/A, 08/14/2020); Esophagogastroduodenoscopy (egd) with propofol (  N/A, 09/16/2020); enteroscopy (N/A, 09/16/2020); Givens capsule study (N/A, 10/11/2020); Esophagogastroduodenoscopy (egd) with propofol (N/A, 10/25/2020); and enteroscopy (N/A, 10/25/2020).   His family history includes CAD in his father; Diabetes in his brother, mother, and sister; Lung cancer in his mother; Other in his maternal grandfather; Stroke in his sister.He reports that he has never smoked. He has never used smokeless tobacco. He reports that he does not drink alcohol and does not use drugs.    ROS Review of Systems  Constitutional:  Negative for fever.  Respiratory:  Negative for shortness of breath.    Cardiovascular:  Negative for chest pain.  Musculoskeletal:  Negative for arthralgias.  Skin:  Negative for rash.    Objective:  BP (!) 152/72   Pulse 60   Temp 97.9 F (36.6 C)   SpO2 98%   BP Readings from Last 3 Encounters:  01/11/22 (!) 152/72  12/07/21 135/69  09/29/21 132/64    Wt Readings from Last 3 Encounters:  12/07/21 150 lb (68 kg)  05/24/21 150 lb (68 kg)  04/24/21 150 lb (68 kg)     Physical Exam Vitals reviewed.  Constitutional:      Appearance: He is well-developed.  HENT:     Head: Normocephalic and atraumatic.     Right Ear: External ear normal.     Left Ear: External ear normal.     Mouth/Throat:     Pharynx: No oropharyngeal exudate or posterior oropharyngeal erythema.  Eyes:     Pupils: Pupils are equal, round, and reactive to light.  Cardiovascular:     Rate and Rhythm: Normal rate and regular rhythm.     Heart sounds: No murmur heard. Pulmonary:     Effort: No respiratory distress.     Breath sounds: Normal breath sounds.  Musculoskeletal:     Cervical back: Normal range of motion and neck supple.  Neurological:     Mental Status: He is alert and oriented to person, place, and time.       Assessment & Plan:   Elhadj was seen today for medical management of chronic issues.  Diagnoses and all orders for this visit:  Long term current use of anticoagulant  Essential (primary) hypertension -     amLODipine (NORVASC) 5 MG tablet; Take 1 tablet (5 mg total) by mouth daily. -     lisinopril (ZESTRIL) 5 MG tablet; Take 1 tablet (5 mg total) by mouth daily.  Severe episode of recurrent major depressive disorder, without psychotic features ([place]) -     escitalopram (LEXAPRO) 20 MG tablet; Take 1 tablet (20 mg total) by mouth daily. -     mirtazapine (REMERON) 7.5 MG tablet; Take 1 tablet (7.5 mg total) by mouth at bedtime.  Generalized anxiety disorder -     escitalopram (LEXAPRO) 20 MG tablet; Take 1 tablet (20 mg total) by mouth  daily. -     mirtazapine (REMERON) 7.5 MG tablet; Take 1 tablet (7.5 mg total) by mouth at bedtime.  Generalized edema -     furosemide (LASIX) 20 MG tablet; Take 1 tablet (20 mg total) by mouth daily.  Gastroesophageal reflux disease, unspecified whether esophagitis present -     pantoprazole (PROTONIX) 40 MG tablet; Take 1 tablet (40 mg total) by mouth 2 (two) times daily. -     sucralfate (CARAFATE) 1 g tablet; Take 1 tablet (1 g total) by mouth 2 (two) times daily.  Mixed hyperlipidemia -     pravastatin (PRAVACHOL) 40 MG tablet; Take  1 tablet (40 mg total) by mouth daily.  Abdominal aortic aneurysm (AAA) without rupture, unspecified part ([place]) -     pravastatin (PRAVACHOL) 40 MG tablet; Take 1 tablet (40 mg total) by mouth daily.  History of non-ST elevation myocardial infarction (NSTEMI) -     pravastatin (PRAVACHOL) 40 MG tablet; Take 1 tablet (40 mg total) by mouth daily.  History of TIA (transient ischemic attack) -     pravastatin (PRAVACHOL) 40 MG tablet; Take 1 tablet (40 mg total) by mouth daily.  Paraplegia, incomplete ([place])  Meningioma ([place])  Other orders -     tamsulosin (FLOMAX) 0.4 MG CAPS capsule; Take 1 capsule (0.4 mg total) by mouth daily.       I have changed [facility] L. Quintin's amLODipine, escitalopram, furosemide, lisinopril, mirtazapine, pantoprazole, pravastatin, sucralfate, and tamsulosin. I am also having him maintain his cyanocobalamin, apixaban, albuterol, gabapentin, FeroSul, and dofetilide.  Allergies as of 01/11/2022       Reactions   Hydrocodone-acetaminophen    Passed out and got real weak   Morphine And Related Other (See Comments)   Reaction:  Confusion/weakness/hallucinations   Other Other (See Comments)   Reaction:  Confusion/weakness/hallucinations   Valsartan Rash        Medication List        Accurate as of January 11, 2022 11:59 PM. If you have any questions, ask your nurse or doctor.          albuterol 108 (90 Base)  MCG/ACT inhaler Commonly known as: VENTOLIN HFA INHALE 2 PUFFS INTO THE LUNGS EVERY 6 HOURS AS NEEDED FOR WHEEZING OR SHORTNESS OF BREATH   amLODipine 5 MG tablet Commonly known as: NORVASC Take 1 tablet (5 mg total) by mouth daily.   apixaban 5 MG Tabs tablet Commonly known as: Eliquis Take 1 tablet (5 mg total) by mouth 2 (two) times daily.   cyanocobalamin 1000 MCG tablet Commonly known as: VITAMIN B12 Take 1 tablet (1,000 mcg total) by mouth daily.   dofetilide 500 MCG capsule Commonly known as: TIKOSYN Take 1 capsule by mouth twice daily   escitalopram 20 MG tablet Commonly known as: LEXAPRO Take 1 tablet (20 mg total) by mouth daily.   FeroSul 325 (65 FE) MG tablet Generic drug: ferrous sulfate TAKE 1 TABLET DAILY   furosemide 20 MG tablet Commonly known as: LASIX Take 1 tablet (20 mg total) by mouth daily.   gabapentin 300 MG capsule Commonly known as: NEURONTIN Take 300 mg by mouth three times daily   lisinopril 5 MG tablet Commonly known as: ZESTRIL Take 1 tablet (5 mg total) by mouth daily.   mirtazapine 7.5 MG tablet Commonly known as: REMERON Take 1 tablet (7.5 mg total) by mouth at bedtime.   pantoprazole 40 MG tablet Commonly known as: PROTONIX Take 1 tablet (40 mg total) by mouth 2 (two) times daily.   pravastatin 40 MG tablet Commonly known as: PRAVACHOL Take 1 tablet (40 mg total) by mouth daily.   sucralfate 1 g tablet Commonly known as: CARAFATE Take 1 tablet (1 g total) by mouth 2 (two) times daily.   tamsulosin 0.4 MG Caps capsule Commonly known as: FLOMAX Take 1 capsule (0.4 mg total) by mouth daily.         Follow-up: No follow-ups on file.  Claretta Fraise, M.D.

## 2022-01-13 ENCOUNTER — Encounter: Payer: Self-pay | Admitting: Family Medicine

## 2022-01-18 DIAGNOSIS — C4441 Basal cell carcinoma of skin of scalp and neck: Secondary | ICD-10-CM | POA: Diagnosis not present

## 2022-01-30 ENCOUNTER — Ambulatory Visit: Payer: Medicare Other | Admitting: Family Medicine

## 2022-02-19 ENCOUNTER — Ambulatory Visit (HOSPITAL_COMMUNITY)
Admission: RE | Admit: 2022-02-19 | Discharge: 2022-02-19 | Disposition: A | Discharge status: Home / Self Care | Payer: Medicare Other | Source: Ambulatory Visit | Attending: Surgery | Admitting: Surgery

## 2022-02-19 ENCOUNTER — Ambulatory Visit: Payer: Medicare Other | Admitting: Physician Assistant

## 2022-02-19 VITALS — BP 139/68 | HR 59 | Temp 97.9°F | Resp 18 | Ht 68.0 in | Wt 150.0 lb

## 2022-02-19 DIAGNOSIS — I714 Abdominal aortic aneurysm, without rupture, unspecified: Secondary | ICD-10-CM | POA: Diagnosis not present

## 2022-02-19 NOTE — Progress Notes (Signed)
[facility] [place] HISTORY AND PHYSICAL   History of Present Illness:  Patient is a 81 y.o. year old male who presents for evaluation of abdominal aortic aneurysm.  he AAA was detected on CT scan in 2021 during work up for GI bleed.  He denies new symptoms of abdominal pain or lumbar pain out of the ordinary.     He denise claudication, rest pain and non healing wounds on B LE.    He was last seen 04/24/21 and at that time the largest diameter was 4.82 on Ultrasound.    He has history of Guillain Barre Syndrome, CVA/TIA and Afib. He is medically managed on Eliquis and daily Statin. His mobility is dependent on a WC.     Past Medical History:  Diagnosis Date   AAA (abdominal aortic aneurysm) ([place])    Brain tumor ([place])    x2   Cardiac arrhythmia    CHF (congestive heart failure) ([place])    Guillain-Barre syndrome ([place]) Feb 13 1986   Hypertension    Kidney stones    Myocardial infarction ([place]) 2007    Past Surgical History:  Procedure Laterality Date   COLONOSCOPY WITH PROPOFOL N/A 08/14/2020   Procedure: COLONOSCOPY WITH PROPOFOL;  Surgeon: Rogene Houston, MD;  [facility];  Service: Endoscopy;  Laterality: N/A;   CORONARY ANGIOPLASTY  1997   after mi   CYSTOSCOPY WITH RETROGRADE PYELOGRAM, URETEROSCOPY AND STENT PLACEMENT Left 06/28/2014   Procedure: 1ST STAGE CYSTOSCOPY/URETEROSCOPY/STENT PLACEMENT;  Surgeon: Alexis Frock, MD;  Location: [facility];  Service: Urology;  Laterality: Left;   CYSTOSCOPY WITH STENT PLACEMENT Left 05/08/2014   Procedure: CYSTOSCOPY, RETROGRADE PYELOGRAM WITH LEFT URETERAL STENT PLACEMENT;  Surgeon: Alexis Frock, MD;  [facility];  Service: Urology;  Laterality: Left;   ENTEROSCOPY N/A 09/16/2020   Procedure: PUSH ENTEROSCOPY;  Surgeon: Harvel Quale, MD;  [facility];  Service: Gastroenterology;  Laterality: N/A;   ENTEROSCOPY N/A 10/25/2020   Procedure: PUSH ENTEROSCOPY;  Surgeon: Harvel Quale, MD;  [facility];  Service: Gastroenterology;  Laterality: N/A;   ESOPHAGOGASTRODUODENOSCOPY (EGD) WITH PROPOFOL N/A 12/16/2019   Procedure: ESOPHAGOGASTRODUODENOSCOPY (EGD) WITH PROPOFOL;  Surgeon: Mauri Pole, MD;  [facility];  Service: Endoscopy;  Laterality: N/A;   ESOPHAGOGASTRODUODENOSCOPY (EGD) WITH PROPOFOL N/A 08/13/2020   Procedure: ESOPHAGOGASTRODUODENOSCOPY (EGD) WITH PROPOFOL;  Surgeon: Rogene Houston, MD;  [facility];  Service: Endoscopy;  Laterality: N/A;   ESOPHAGOGASTRODUODENOSCOPY (EGD) WITH PROPOFOL N/A 09/16/2020   Procedure: ESOPHAGOGASTRODUODENOSCOPY (EGD) WITH PROPOFOL;  Surgeon: Harvel Quale, MD;  [facility];  Service: Gastroenterology;  Laterality: N/A;  1:55   ESOPHAGOGASTRODUODENOSCOPY (EGD) WITH PROPOFOL N/A 10/25/2020   Procedure: ESOPHAGOGASTRODUODENOSCOPY (EGD) WITH PROPOFOL;  Surgeon: Harvel Quale, MD;  [facility];  Service: Gastroenterology;  Laterality: N/A;  2:00   EYE SURGERY Right may 2015   growth removed, july 2015left eye cataract removed, right eye catarct removed   gamma kniferadiation treatment  Feb 09 2014   [facility]   GIVENS CAPSULE STUDY N/A 10/11/2020   Procedure: GIVENS CAPSULE STUDY;  Surgeon: Harvel Quale, MD;  [facility];  Service: Gastroenterology;  Laterality: N/A;  7:30   HEMOSTASIS CLIP PLACEMENT  12/16/2019   Procedure: HEMOSTASIS CLIP PLACEMENT;  Surgeon: Mauri Pole, MD;  [facility];  Service: Endoscopy;;   HEMOSTASIS CONTROL  12/16/2019   Procedure: HEMOSTASIS CONTROL;  Surgeon:  Mauri Pole, MD;  [facility];  Service: Endoscopy;;  Endoloop   HOLMIUM LASER APPLICATION Left 1/96/2229   Procedure: HOLMIUM LASER APPLICATION;  Surgeon: Alexis Frock, MD;  [facility];  Service: Urology;  Laterality: Left;   LITHOTRIPSY  years ago   POLYPECTOMY   12/16/2019   Procedure: POLYPECTOMY;  Surgeon: Mauri Pole, MD;  [facility];  Service: Endoscopy;;   STONE EXTRACTION WITH BASKET Left 06/28/2014   Procedure: STONE EXTRACTION WITH BASKET;  Surgeon: Alexis Frock, MD;  [facility];  Service: Urology;  Laterality: Left;     Social History Social History   Tobacco Use   Smoking status: Never   Smokeless tobacco: Never  Vaping Use   Vaping Use: Never used  Substance Use Topics   Alcohol use: No   Drug use: No    Family History Family History  Problem Relation Age of Onset   Diabetes Mother    Lung cancer Mother    CAD Father    Diabetes Brother    Diabetes Sister    Stroke Sister    Other Maternal Grandfather        brain tumor   Colon cancer Neg Hx    Pancreatic cancer Neg Hx    Esophageal cancer Neg Hx     Allergies  Allergies  Allergen Reactions   Hydrocodone-Acetaminophen     Passed out and got real weak   Morphine And Related Other (See Comments)    Reaction:  Confusion/weakness/hallucinations   Other Other (See Comments)    Reaction:  Confusion/weakness/hallucinations   Valsartan Rash     Current Outpatient Medications  Medication Sig Dispense Refill   albuterol (VENTOLIN HFA) 108 (90 Base) MCG/ACT inhaler INHALE 2 PUFFS INTO THE LUNGS EVERY 6 HOURS AS NEEDED FOR WHEEZING OR SHORTNESS OF BREATH 8.5 g 1   amLODipine (NORVASC) 5 MG tablet Take 1 tablet (5 mg total) by mouth daily. 90 tablet 3   apixaban (ELIQUIS) 5 MG TABS tablet Take 1 tablet (5 mg total) by mouth 2 (two) times daily. 180 tablet 3   dofetilide (TIKOSYN) 500 MCG capsule Take 1 capsule by mouth twice daily 60 capsule 2   escitalopram (LEXAPRO) 20 MG tablet Take 1 tablet (20 mg total) by mouth daily. 90 tablet 3   FEROSUL 325 (65 Fe) MG tablet TAKE 1 TABLET DAILY 90 tablet 0   furosemide (LASIX) 20 MG tablet Take 1 tablet (20 mg total) by mouth daily. 90 tablet 3   gabapentin (NEURONTIN) 300 MG capsule Take 300 mg by  mouth three times daily 360 capsule 1   lisinopril (ZESTRIL) 5 MG tablet Take 1 tablet (5 mg total) by mouth daily. 90 tablet 3   mirtazapine (REMERON) 7.5 MG tablet Take 1 tablet (7.5 mg total) by mouth at bedtime. 90 tablet 3   pantoprazole (PROTONIX) 40 MG tablet Take 1 tablet (40 mg total) by mouth 2 (two) times daily. 180 tablet 3   pravastatin (PRAVACHOL) 40 MG tablet Take 1 tablet (40 mg total) by mouth daily. 90 tablet 3   sucralfate (CARAFATE) 1 g tablet Take 1 tablet (1 g total) by mouth 2 (two) times daily. 180 tablet 3   tamsulosin (FLOMAX) 0.4 MG CAPS capsule Take 1 capsule (0.4 mg total) by mouth daily. 90 capsule 3   vitamin B-12 (CYANOCOBALAMIN) 1000 MCG tablet Take 1 tablet (1,000 mcg total) by mouth daily. 30 tablet 2   No current facility-administered medications for this visit.  ROS:   General:  No weight loss, Fever, chills  HEENT: No recent headaches, no nasal bleeding, no visual changes, no sore throat  Neurologic: No dizziness, blackouts, seizures. No recent symptoms of stroke or mini- stroke. No recent episodes of slurred speech, or temporary blindness.  Semi-Quad WC bound  Cardiac: No recent episodes of chest pain/pressure, no shortness of breath at rest.  No shortness of breath with exertion.  Denies history of atrial fibrillation or irregular heartbeat  Vascular: No history of rest pain in feet.  No history of claudication.  No history of non-healing ulcer, No history of DVT   Pulmonary: No home oxygen, no productive cough, no hemoptysis,  No asthma or wheezing  Musculoskeletal:  '[ ]'$  Arthritis, '[ ]'$  Low back pain,  '[ ]'$  Joint pain   Hematologic:No history of hypercoagulable state.  No history of easy bleeding.  No history of anemia  Gastrointestinal: No hematochezia or melena,  No gastroesophageal reflux, no trouble swallowing  Urinary: '[ ]'$  chronic Kidney disease, '[ ]'$  on HD - '[ ]'$  MWF or '[ ]'$  TTHS, '[ ]'$  Burning with urination, '[ ]'$  Frequent urination, '[ ]'$   Difficulty urinating;   Skin: No rashes  Psychological: No history of anxiety,  No history of depression   Physical Examination  Vitals:   02/19/22 0824  BP: 139/68  Pulse: (!) 59  Resp: 18  Temp: 97.9 F (36.6 C)  TempSrc: Temporal  SpO2: 98%  Weight: 150 lb (68 kg)  Height: '5\' 8"'$  (1.727 m)    Body mass index is 22.81 kg/m.  General:  Alert and oriented, no acute distress HEENT: Normal Neck: No bruit or JVD Pulmonary: Clear to auscultation bilaterally Cardiac: Regular Rate and Rhythm without murmur Gastrointestinal: Soft, non-tender, non-distended, no mass, no scars Skin: No rash Extremity Pulses:  no ischemic skin changes Musculoskeletal: No deformity or edema  Neurologic: Upper and lower extremity motor 5/5 and symmetric  DATA:  ABDOMINAL AORTA STUDY      Abdominal Aorta Findings: +-----------+-------+----------+----------+----------+--------+--------+ Location   AP (cm)Trans (cm)PSV (cm/s)Waveform  ThrombusComments +-----------+-------+----------+----------+----------+--------+--------+ Proximal   2.10   2.40      54                                   +-----------+-------+----------+----------+----------+--------+--------+ Mid        2.60   3.00      50                                   +-----------+-------+----------+----------+----------+--------+--------+ Distal     5.10   5.30      47                                   +-----------+-------+----------+----------+----------+--------+--------+ RT CIA Prox1.7    1.9       88        biphasic                   +-----------+-------+----------+----------+----------+--------+--------+ LT CIA Prox1.0    1.2       79        monophasic                 +-----------+-------+----------+----------+----------+--------+--------+  Summary: Abdominal Aorta: There is evidence of abnormal dilatation of the distal  Abdominal aorta, largest diameter measuring 5.3cm. The largest  aortic diameter has increased compared to prior exam. Previous diameter measurement was 4.8 cm obtained on 04/24/2021.      ASSESSMENT:  Asymptomatic AAA with duplex demonstrating largest diameter of  5.3 cm this has increased from 4.8 in 6 months.  He remains asymptomatic for abdominal/lumbar pain out of the ordinary.  He is non ambulatory, but does not have rest pain or non healing wounds.    PLAN: The aneurysm has increased > 0.5 in 6 month period, so I  will order CTA aorta angiogram of the AAA abd/pelvis and  he will f/u with Dr. Trula Slade.   Roxy Horseman PA-C [facility] [place] Office: [phone]  MD on call Donzetta Matters

## 2022-02-27 ENCOUNTER — Other Ambulatory Visit: Payer: Self-pay

## 2022-02-27 DIAGNOSIS — I714 Abdominal aortic aneurysm, without rupture, unspecified: Secondary | ICD-10-CM

## 2022-03-02 ENCOUNTER — Ambulatory Visit (HOSPITAL_COMMUNITY)
Admission: RE | Admit: 2022-03-02 | Discharge: 2022-03-02 | Disposition: A | Discharge status: Home / Self Care | Payer: Medicare Other | Source: Ambulatory Visit | Attending: Surgery | Admitting: Surgery

## 2022-03-02 DIAGNOSIS — I714 Abdominal aortic aneurysm, without rupture, unspecified: Secondary | ICD-10-CM | POA: Diagnosis not present

## 2022-03-02 DIAGNOSIS — I7143 Infrarenal abdominal aortic aneurysm, without rupture: Secondary | ICD-10-CM | POA: Diagnosis not present

## 2022-03-02 DIAGNOSIS — N281 Cyst of kidney, acquired: Secondary | ICD-10-CM | POA: Diagnosis not present

## 2022-03-02 MED ORDER — IOHEXOL 350 MG/ML SOLN
60.0000 mL | Freq: Once | INTRAVENOUS | Status: AC | PRN
  Administered 2022-03-02: 60 mL via INTRAVENOUS

## 2022-03-05 ENCOUNTER — Ambulatory Visit: Payer: Medicare Other | Admitting: Surgery

## 2022-03-05 ENCOUNTER — Encounter: Payer: Self-pay | Admitting: Surgery

## 2022-03-05 VITALS — BP 161/76 | HR 54 | Temp 97.9°F | Resp 20 | Ht 68.0 in | Wt 150.0 lb

## 2022-03-05 DIAGNOSIS — I7143 Infrarenal abdominal aortic aneurysm, without rupture: Secondary | ICD-10-CM | POA: Diagnosis not present

## 2022-03-05 NOTE — Progress Notes (Signed)
[facility]  Patient name: Theodore Mendoza MRN: [id number] DOB: 1940-12-24 Sex: male   REASON FOR VISIT:    Follow-up  HISOTRY OF PRESENT ILLNESS:    [name] is a 81 y.o. male who returns today for follow-up of an abdominal aortic aneurysm that was detected on a CT scan for a GI bleed in 2021.  His most recent ultrasound showed progression to 5.3 cm and so he was sent for a CT scan.  Patient has a history of coronary artery disease status post PCI and 2008 for a NSTEMI.  He is on anticoagulation for atrial fibrillation.  He has undergone cardioversion.  He also has a history of TIA in 2017.  He was diagnosed with Guillain-Barr syndrome in 1987, and has been wheelchair-bound.  He has a meningioma that is being followed by neurosurgery.  PAST MEDICAL HISTORY:   Past Medical History:  Diagnosis Date   AAA (abdominal aortic aneurysm) ([place])    Brain tumor ([place])    x2   Cardiac arrhythmia    CHF (congestive heart failure) ([place])    Guillain-Barre syndrome ([facility]) Feb 13 1986   Hypertension    Kidney stones    Myocardial infarction ([place]) 2007     FAMILY HISTORY:   Family History  Problem Relation Age of Onset   Diabetes Mother    Lung cancer Mother    CAD Father    Diabetes Brother    Diabetes Sister    Stroke Sister    Other Maternal Grandfather        brain tumor   Colon cancer Neg Hx    Pancreatic cancer Neg Hx    Esophageal cancer Neg Hx     SOCIAL HISTORY:   Social History   Tobacco Use   Smoking status: Never   Smokeless tobacco: Never  Substance Use Topics   Alcohol use: No     ALLERGIES:   Allergies  Allergen Reactions   Hydrocodone-Acetaminophen     Passed out and got real weak   Morphine And Related Other (See Comments)    Reaction:  Confusion/weakness/hallucinations   Other Other (See Comments)    Reaction:  Confusion/weakness/hallucinations   Valsartan Rash     CURRENT  MEDICATIONS:   Current Outpatient Medications  Medication Sig Dispense Refill   albuterol (VENTOLIN HFA) 108 (90 Base) MCG/ACT inhaler INHALE 2 PUFFS INTO THE LUNGS EVERY 6 HOURS AS NEEDED FOR WHEEZING OR SHORTNESS OF BREATH 8.5 g 1   amLODipine (NORVASC) 5 MG tablet Take 1 tablet (5 mg total) by mouth daily. 90 tablet 3   apixaban (ELIQUIS) 5 MG TABS tablet Take 1 tablet (5 mg total) by mouth 2 (two) times daily. 180 tablet 3   dofetilide (TIKOSYN) 500 MCG capsule Take 1 capsule by mouth twice daily 60 capsule 2   escitalopram (LEXAPRO) 20 MG tablet Take 1 tablet (20 mg total) by mouth daily. 90 tablet 3   FEROSUL 325 (65 Fe) MG tablet TAKE 1 TABLET DAILY 90 tablet 0   furosemide (LASIX) 20 MG tablet Take 1 tablet (20 mg total) by mouth daily. 90 tablet 3   gabapentin (NEURONTIN) 300 MG capsule Take 300 mg by mouth three times daily 360 capsule 1   lisinopril (ZESTRIL) 5 MG tablet Take 1 tablet (5 mg total) by mouth daily. 90 tablet 3   mirtazapine (REMERON) 7.5 MG tablet Take 1 tablet (7.5 mg total) by mouth at bedtime. 90 tablet 3  pantoprazole (PROTONIX) 40 MG tablet Take 1 tablet (40 mg total) by mouth 2 (two) times daily. 180 tablet 3   pravastatin (PRAVACHOL) 40 MG tablet Take 1 tablet (40 mg total) by mouth daily. 90 tablet 3   sucralfate (CARAFATE) 1 g tablet Take 1 tablet (1 g total) by mouth 2 (two) times daily. 180 tablet 3   tamsulosin (FLOMAX) 0.4 MG CAPS capsule Take 1 capsule (0.4 mg total) by mouth daily. 90 capsule 3   vitamin B-12 (CYANOCOBALAMIN) 1000 MCG tablet Take 1 tablet (1,000 mcg total) by mouth daily. 30 tablet 2   No current facility-administered medications for this visit.    REVIEW OF SYSTEMS:   '[X]'$  denotes positive finding, '[ ]'$  denotes negative finding Cardiac  Comments:  Chest pain or chest pressure:    Shortness of breath upon exertion:    Short of breath when lying flat:    Irregular heart rhythm:        Vascular    Pain in calf, thigh, or hip  brought on by ambulation:    Pain in feet at night that wakes you up from your sleep:     Blood clot in your veins:    Leg swelling:         Pulmonary    Oxygen at home:    Productive cough:     Wheezing:         Neurologic    Sudden weakness in arms or legs:     Sudden numbness in arms or legs:     Sudden onset of difficulty speaking or slurred speech:    Temporary loss of vision in one eye:     Problems with dizziness:         Gastrointestinal    Blood in stool:     Vomited blood:         Genitourinary    Burning when urinating:     Blood in urine:        Psychiatric    Major depression:         Hematologic    Bleeding problems:    Problems with blood clotting too easily:        Skin    Rashes or ulcers:        Constitutional    Fever or chills:      PHYSICAL EXAM:   Vitals:   03/05/22 1133  BP: (!) 161/76  Pulse: (!) 54  Resp: 20  Temp: 97.9 F (36.6 C)  SpO2: 96%  Weight: 150 lb (68 kg)  Height: '5\' 8"'$  (1.727 m)    GENERAL: The patient is a well-nourished male, in no acute distress. The vital signs are documented above. CARDIAC: There is a regular rate and rhythm.  VASCULAR: Palpable pedal pulses edema PULMONARY: Non-labored respirations ABDOMEN: Soft and non-tender ds.  MUSCULOSKELETAL: There are no major deformities or cyanosis. NEUROLOGIC: No focal weakness or paresthesias are detected. SKIN: There are no ulcers or rashes noted. PSYCHIATRIC: The patient has a normal affect.  STUDIES:   I reviewed his CT scan with the following findings: 1. Unchanged infrarenal abdominal aortic aneurysm measuring up to 4.8 x 4.7 cm. Patient already under the care of vascular surgeon. 2. Unchanged penetrating ulcers of the infrarenal abdominal aorta measuring up to 2.3 x 1.5 cm and 2.1 x 0.8 cm. 3. Unchanged penetrating ulcer of the proximal right common iliac artery measuring up to 1.0 x 0.5 cm. 4. No acute abnormality of the abdomen or  pelvis. 5. 2 mm  nonobstructing right renal calculus. 6. Moderately enlarged, nodular prostate impressing upon the bladder neck.  MEDICAL ISSUES:   AAA: By my measurement that aneurysm is 5.2 cm which is in line with what ultrasound showed.  In addition there is a penetrating ulcer with a saccular component that looks very ugly and has progressed.  I therefore talked to the patient about proceeding with endovascular repair.  He is amenable.  He will need to be off of his Eliquis.  We will do this after the holidays.  I discussed the risk and benefits of the operation including the risk of cardiopulmonary issues, stroke, bleeding, intestinal ischemia and renal insufficiencies as well as access issues.  All questions were answered.    Leia Alf, MD, FACS [facility] [phone] Pager [phone]

## 2022-03-05 NOTE — H&P (View-Only) (Signed)
[facility]ist of [facility]  Patient name: Theodore Mendoza MRN: [id number] DOB: [birth year] Sex: male   REASON FOR VISIT:    Follow-up  HISOTRY OF PRESENT ILLNESS:    Theodore Mendoza is a 81 y.o. male who returns today for follow-up of an abdominal aortic aneurysm that was detected on a CT scan for a GI bleed in 2021.  His most recent ultrasound showed progression to 5.3 cm and so he was sent for a CT scan.  Patient has a history of coronary artery disease status post PCI and 2008 for a NSTEMI.  He is on anticoagulation for atrial fibrillation.  He has undergone cardioversion.  He also has a history of TIA in 2017.  He was diagnosed with Guillain-Barr syndrome in 1987, and has been wheelchair-bound.  He has a meningioma that is being followed by neurosurgery.  PAST MEDICAL HISTORY:   Past Medical History:  Diagnosis Date   AAA (abdominal aortic aneurysm) ([place])    Brain tumor ([place])    x2   Cardiac arrhythmia    CHF (congestive heart failure) ([place])    Guillain-Barre syndrome ([facility]) Feb 13 1986   Hypertension    Kidney stones    Myocardial infarction ([place]) 2007     FAMILY HISTORY:   Family History  Problem Relation Age of Onset   Diabetes Mother    Lung cancer Mother    CAD Father    Diabetes Brother    Diabetes Sister    Stroke Sister    Other Maternal Grandfather        brain tumor   Colon cancer Neg Hx    Pancreatic cancer Neg Hx    Esophageal cancer Neg Hx     SOCIAL HISTORY:   Social History   Tobacco Use   Smoking status: Never   Smokeless tobacco: Never  Substance Use Topics   Alcohol use: No     ALLERGIES:   Allergies  Allergen Reactions   Hydrocodone-Acetaminophen     Passed out and got real weak   Morphine And Related Other (See Comments)    Reaction:  Confusion/weakness/hallucinations   Other Other (See Comments)    Reaction:  Confusion/weakness/hallucinations   Valsartan Rash     CURRENT  MEDICATIONS:   Current Outpatient Medications  Medication Sig Dispense Refill   albuterol (VENTOLIN HFA) 108 (90 Base) MCG/ACT inhaler INHALE 2 PUFFS INTO THE LUNGS EVERY 6 HOURS AS NEEDED FOR WHEEZING OR SHORTNESS OF BREATH 8.5 g 1   amLODipine (NORVASC) 5 MG tablet Take 1 tablet (5 mg total) by mouth daily. 90 tablet 3   apixaban (ELIQUIS) 5 MG TABS tablet Take 1 tablet (5 mg total) by mouth 2 (two) times daily. 180 tablet 3   dofetilide (TIKOSYN) 500 MCG capsule Take 1 capsule by mouth twice daily 60 capsule 2   escitalopram (LEXAPRO) 20 MG tablet Take 1 tablet (20 mg total) by mouth daily. 90 tablet 3   FEROSUL 325 (65 Fe) MG tablet TAKE 1 TABLET DAILY 90 tablet 0   furosemide (LASIX) 20 MG tablet Take 1 tablet (20 mg total) by mouth daily. 90 tablet 3   gabapentin (NEURONTIN) 300 MG capsule Take 300 mg by mouth three times daily 360 capsule 1   lisinopril (ZESTRIL) 5 MG tablet Take 1 tablet (5 mg total) by mouth daily. 90 tablet 3   mirtazapine (REMERON) 7.5 MG tablet Take 1 tablet (7.5 mg total) by mouth at bedtime. 90 tablet 3  pantoprazole (PROTONIX) 40 MG tablet Take 1 tablet (40 mg total) by mouth 2 (two) times daily. 180 tablet 3   pravastatin (PRAVACHOL) 40 MG tablet Take 1 tablet (40 mg total) by mouth daily. 90 tablet 3   sucralfate (CARAFATE) 1 g tablet Take 1 tablet (1 g total) by mouth 2 (two) times daily. 180 tablet 3   tamsulosin (FLOMAX) 0.4 MG CAPS capsule Take 1 capsule (0.4 mg total) by mouth daily. 90 capsule 3   vitamin B-12 (CYANOCOBALAMIN) 1000 MCG tablet Take 1 tablet (1,000 mcg total) by mouth daily. 30 tablet 2   No current facility-administered medications for this visit.    REVIEW OF SYSTEMS:   '[X]'$  denotes positive finding, '[ ]'$  denotes negative finding Cardiac  Comments:  Chest pain or chest pressure:    Shortness of breath upon exertion:    Short of breath when lying flat:    Irregular heart rhythm:        Vascular    Pain in calf, thigh, or hip  brought on by ambulation:    Pain in feet at night that wakes you up from your sleep:     Blood clot in your veins:    Leg swelling:         Pulmonary    Oxygen at home:    Productive cough:     Wheezing:         Neurologic    Sudden weakness in arms or legs:     Sudden numbness in arms or legs:     Sudden onset of difficulty speaking or slurred speech:    Temporary loss of vision in one eye:     Problems with dizziness:         Gastrointestinal    Blood in stool:     Vomited blood:         Genitourinary    Burning when urinating:     Blood in urine:        Psychiatric    Major depression:         Hematologic    Bleeding problems:    Problems with blood clotting too easily:        Skin    Rashes or ulcers:        Constitutional    Fever or chills:      PHYSICAL EXAM:   Vitals:   03/05/22 1133  BP: (!) 161/76  Pulse: (!) 54  Resp: 20  Temp: 97.9 F (36.6 C)  SpO2: 96%  Weight: 150 lb (68 kg)  Height: '5\' 8"'$  (1.727 m)    GENERAL: The patient is a well-nourished male, in no acute distress. The vital signs are documented above. CARDIAC: There is a regular rate and rhythm.  VASCULAR: Palpable pedal pulses edema PULMONARY: Non-labored respirations ABDOMEN: Soft and non-tender ds.  MUSCULOSKELETAL: There are no major deformities or cyanosis. NEUROLOGIC: No focal weakness or paresthesias are detected. SKIN: There are no ulcers or rashes noted. PSYCHIATRIC: The patient has a normal affect.  STUDIES:   I reviewed his CT scan with the following findings: 1. Unchanged infrarenal abdominal aortic aneurysm measuring up to 4.8 x 4.7 cm. Patient already under the care of vascular surgeon. 2. Unchanged penetrating ulcers of the infrarenal abdominal aorta measuring up to 2.3 x 1.5 cm and 2.1 x 0.8 cm. 3. Unchanged penetrating ulcer of the proximal right common iliac artery measuring up to 1.0 x 0.5 cm. 4. No acute abnormality of the abdomen or  pelvis. 5. 2 mm  nonobstructing right renal calculus. 6. Moderately enlarged, nodular prostate impressing upon the bladder neck.  MEDICAL ISSUES:   AAA: By my measurement that aneurysm is 5.2 cm which is in line with what ultrasound showed.  In addition there is a penetrating ulcer with a saccular component that looks very ugly and has progressed.  I therefore talked to the patient about proceeding with endovascular repair.  He is amenable.  He will need to be off of his Eliquis.  We will do this after the holidays.  I discussed the risk and benefits of the operation including the risk of cardiopulmonary issues, stroke, bleeding, intestinal ischemia and renal insufficiencies as well as access issues.  All questions were answered.    Leia Alf, MD, FACS [facility] [phone] Pager [phone]

## 2022-03-08 ENCOUNTER — Encounter (INDEPENDENT_AMBULATORY_CARE_PROVIDER_SITE_OTHER): Payer: Self-pay | Admitting: Gastroenterology

## 2022-03-08 ENCOUNTER — Ambulatory Visit (INDEPENDENT_AMBULATORY_CARE_PROVIDER_SITE_OTHER): Payer: Medicare Other | Admitting: Gastroenterology

## 2022-03-08 VITALS — BP 142/70 | HR 54 | Temp 97.3°F | Ht 68.0 in | Wt 150.0 lb

## 2022-03-08 DIAGNOSIS — D509 Iron deficiency anemia, unspecified: Secondary | ICD-10-CM | POA: Insufficient documentation

## 2022-03-08 NOTE — Addendum Note (Signed)
Addended by: Carmelina Noun on: 03/08/2022 09:13 AM   Modules accepted: Orders

## 2022-03-08 NOTE — Progress Notes (Addendum)
Referring Provider: Claretta Fraise, MD Primary Care Physician:  Claretta Fraise, MD Primary GI Physician: Jenetta Downer   Chief Complaint  Patient presents with   Anemia    Follow up on anemia. Reports no concerns. Feeling well. Has not noticed any bleeding, no dizziness, no sob.    HPI:   Theodore Mendoza is a 81 y.o. male with past medical history of brain tumor, afib on Eliquis, Guillian Barr and heart failure   Patient presenting today for follow up of IDA.  Last seen in office in June 2022, at that time here for GI bleed and anemia.  History:  EGD on 12/16/2019 at [facility].  20 mm polyp ulcerated mucosa in the in the cardia.  A large Endoloop was placed in the polyp stalk.  The polyp was subsequently removed with a hot snare.  2 hemostatic clips were placed successfully but there was persistent oozing from this area.  No further intervention was performed at that time but consultation with interventional radiology was made and the patient was not recommended to have any embolization of the vasculature.  Pathology of the polyp showed hyperplastic polyp with inflammatory changes.  The patient came to the hospital at [facility] on 08/11/2020 with rectal bleeding with straining to defecate. He had rectal bleeding only two times at that time.  Due to drop in his hemoglobin he underwent an EGD and a colonoscopy.  The EGD showed presence of the previously placed clips with no abnormalities up to the third portion of the duodenum.  Colonoscopy showed hemorrhoids and diverticulosis but no other alterations.    ER visit on 09/02/2020 after he was found to have worsening anemia. His hemoglobin dropped down from 8.3-7.4 in 1 day and he was transfused 1 unit PRBC with repeat of 9.1.  Notably his iron stores on 09/01/2020 only showed mildly decreased iron of 36 and iron saturation of 14, ferritin was normal at 79. States that he has been taking iron for the last year and his stools are  currently dark brown but not black.   Recommended push enteroscopy, continue oral iron, consdier Eliquis decrease to 2.'5mg'$ , continue stool softeners.   Small bowel enteroscopy in June 2022 with endoclip in the stomach, no other abnormalities  Underwent capsule endoscopy thereafter in July with non bleeding AVM in duodenum  Last hgb 11.9 in June 2023.   Present:  Patient states he doing well. Denies rectal bleeing or melena. States he feels good. He is still on eliquis '5mg'$  BID. He is having aneurism surgery in j[place]. He denies any shortness of breath or dizziness. No weight loss or changes in appetite. No constipation or diarrhea. Having a BM daily. He is still taking his iron once daily. No red flag symptoms. Patient denies nausea, vomiting, dysphagia, odyonophagia, early satiety or weight loss.   No recent hemoglobin or iron studies  Small Bowel endoscopy: July 2022 Normal esophagus. - Normal stomach. - Normal examined duodenum. - The examined portion of the jejunum was normal. Last Colonoscopy:5/2022Diverticulosis at the hepatic flexure and in the ascending colon. No stigmata of bleed.- External and internal hemorrhoids. - No specimens collected. Last Endoscopy:07/2020- Normal hypopharynx. - Normal esophagus. - Z-line irregular, 41 cm from the incisors. - Two gastric polyps. - Two clips on place at previous polypectomy site without stigmata of bleed. - Normal duodenal bulb, second portion of the duodenum and third portion of the duodenum. - No specimens collected.  Recommendations:    Past Medical  History:  Diagnosis Date   AAA (abdominal aortic aneurysm) ([place])    Brain tumor ([place])    x2   Cardiac arrhythmia    CHF (congestive heart failure) ([place])    Guillain-Barre syndrome ([place]) Feb 13 1986   Hypertension    Kidney stones    Myocardial infarction ([place]) 2007    Past Surgical History:  Procedure Laterality Date   COLONOSCOPY WITH PROPOFOL N/A 08/14/2020   Procedure:  COLONOSCOPY WITH PROPOFOL;  Surgeon: Rogene [place], MD;  [facility];  Service: Endoscopy;  Laterality: N/A;   CORONARY ANGIOPLASTY  1997   after mi   CYSTOSCOPY WITH RETROGRADE PYELOGRAM, URETEROSCOPY AND STENT PLACEMENT Left 06/28/2014   Procedure: 1ST STAGE CYSTOSCOPY/URETEROSCOPY/STENT PLACEMENT;  Surgeon: Alexis Frock, MD;  Location: [facility];  Service: Urology;  Laterality: Left;   CYSTOSCOPY WITH STENT PLACEMENT Left 05/08/2014   Procedure: CYSTOSCOPY, RETROGRADE PYELOGRAM WITH LEFT URETERAL STENT PLACEMENT;  Surgeon: Alexis Frock, MD;  [facility]: [facility];  Service: Urology;  Laterality: Left;   ENTEROSCOPY N/A 09/16/2020   Procedure: PUSH ENTEROSCOPY;  Surgeon: Harvel Quale, MD;  [facility];  Service: Gastroenterology;  Laterality: N/A;   ENTEROSCOPY N/A 10/25/2020   Procedure: PUSH ENTEROSCOPY;  Surgeon: Harvel Quale, MD;  [facility];  Service: Gastroenterology;  Laterality: N/A;   ESOPHAGOGASTRODUODENOSCOPY (EGD) WITH PROPOFOL N/A 12/16/2019   Procedure: ESOPHAGOGASTRODUODENOSCOPY (EGD) WITH PROPOFOL;  Surgeon: Mauri Pole, MD;  [facility];  Service: Endoscopy;  Laterality: N/A;   ESOPHAGOGASTRODUODENOSCOPY (EGD) WITH PROPOFOL N/A 08/13/2020   Procedure: ESOPHAGOGASTRODUODENOSCOPY (EGD) WITH PROPOFOL;  Surgeon: Rogene [place], MD;  [facility];  Service: Endoscopy;  Laterality: N/A;   ESOPHAGOGASTRODUODENOSCOPY (EGD) WITH PROPOFOL N/A 09/16/2020   Procedure: ESOPHAGOGASTRODUODENOSCOPY (EGD) WITH PROPOFOL;  Surgeon: Harvel Quale, MD;  [facility];  Service: Gastroenterology;  Laterality: N/A;  1:55   ESOPHAGOGASTRODUODENOSCOPY (EGD) WITH PROPOFOL N/A 10/25/2020   Procedure: ESOPHAGOGASTRODUODENOSCOPY (EGD) WITH PROPOFOL;  Surgeon: Harvel Quale, MD;  [facility];  Service: Gastroenterology;  Laterality: N/A;  2:00   EYE SURGERY Right may 2015    growth removed, july 2015left eye cataract removed, right eye catarct removed   gamma kniferadiation treatment  Feb 09 2014   [facility]   GIVENS CAPSULE STUDY N/A 10/11/2020   Procedure: GIVENS CAPSULE STUDY;  Surgeon: Harvel Quale, MD;  [facility];  Service: Gastroenterology;  Laterality: N/A;  7:30   HEMOSTASIS CLIP PLACEMENT  12/16/2019   Procedure: HEMOSTASIS CLIP PLACEMENT;  Surgeon: Mauri Pole, MD;  [facility];  Service: Endoscopy;;   HEMOSTASIS CONTROL  12/16/2019   Procedure: HEMOSTASIS CONTROL;  Surgeon: Mauri Pole, MD;  [facility];  Service: Endoscopy;;  Endoloop   HOLMIUM LASER APPLICATION Left 6/96/2952   Procedure: HOLMIUM LASER APPLICATION;  Surgeon: Alexis Frock, MD;  [facility];  Service: Urology;  Laterality: Left;   LITHOTRIPSY  years ago   POLYPECTOMY  12/16/2019   Procedure: POLYPECTOMY;  Surgeon: Mauri Pole, MD;  [facility];  Service: Endoscopy;;   STONE EXTRACTION WITH BASKET Left 06/28/2014   Procedure: STONE EXTRACTION WITH BASKET;  Surgeon: Alexis Frock, MD;  [facility];  Service: Urology;  Laterality: Left;    Current Outpatient Medications  Medication Sig Dispense Refill   albuterol (VENTOLIN HFA) 108 (90 Base) MCG/ACT inhaler INHALE 2 PUFFS INTO THE LUNGS EVERY 6 HOURS AS NEEDED FOR  WHEEZING OR SHORTNESS OF BREATH 8.5 g 1   amLODipine (NORVASC) 5 MG tablet Take 1 tablet (5 mg total) by mouth daily. 90 tablet 3   apixaban (ELIQUIS) 5 MG TABS tablet Take 1 tablet (5 mg total) by mouth 2 (two) times daily. 180 tablet 3   dofetilide (TIKOSYN) 500 MCG capsule Take 1 capsule by mouth twice daily 60 capsule 2   escitalopram (LEXAPRO) 20 MG tablet Take 1 tablet (20 mg total) by mouth daily. 90 tablet 3   FEROSUL 325 (65 Fe) MG tablet TAKE 1 TABLET DAILY 90 tablet 0   furosemide (LASIX) 20 MG tablet Take 1 tablet (20 mg total) by mouth daily. 90 tablet 3    gabapentin (NEURONTIN) 300 MG capsule Take 300 mg by mouth three times daily 360 capsule 1   lisinopril (ZESTRIL) 5 MG tablet Take 1 tablet (5 mg total) by mouth daily. 90 tablet 3   mirtazapine (REMERON) 7.5 MG tablet Take 1 tablet (7.5 mg total) by mouth at bedtime. 90 tablet 3   pantoprazole (PROTONIX) 40 MG tablet Take 1 tablet (40 mg total) by mouth 2 (two) times daily. 180 tablet 3   pravastatin (PRAVACHOL) 40 MG tablet Take 1 tablet (40 mg total) by mouth daily. 90 tablet 3   sucralfate (CARAFATE) 1 g tablet Take 1 tablet (1 g total) by mouth 2 (two) times daily. 180 tablet 3   tamsulosin (FLOMAX) 0.4 MG CAPS capsule Take 1 capsule (0.4 mg total) by mouth daily. 90 capsule 3   vitamin B-12 (CYANOCOBALAMIN) 1000 MCG tablet Take 1 tablet (1,000 mcg total) by mouth daily. 30 tablet 2   No current facility-administered medications for this visit.    Allergies as of 03/08/2022 - Review Complete 03/08/2022  Allergen Reaction Noted   Hydrocodone-acetaminophen  05/19/2019   Morphine and related Other (See Comments) 05/07/2014   Other Other (See Comments) 05/07/2014   Valsartan Rash 05/07/2014    Family History  Problem Relation Age of Onset   Diabetes Mother    Lung cancer Mother    CAD Father    Diabetes Brother    Diabetes Sister    Stroke Sister    Other Maternal Grandfather        brain tumor   Colon cancer Neg Hx    Pancreatic cancer Neg Hx    Esophageal cancer Neg Hx     Social History   Socioeconomic History   Marital status: Married    Spouse name: Enid Derry   Number of children: 1   Years of education: Not on file   Highest education level: Not on file  Occupational History   Occupation: retired   Tobacco Use   Smoking status: Never    Passive exposure: Never   Smokeless tobacco: Never  Vaping Use   Vaping Use: Never used  Substance and Sexual Activity   Alcohol use: No   Drug use: No   Sexual activity: Not Currently  Other Topics Concern   Not on file   Social History Narrative   Lives with wife - they have a basement but don't use it   One son - he's a Environmental education officer and they go to his church   Patient is paraplegic - uses wheelchair, sometimes motorized   He does still drive using hand controls.   Social Determinants of Health   Financial Resource Strain: Medium Risk (05/24/2021)   Overall Financial Resource Strain (CARDIA)    Difficulty of Paying Living Expenses: Somewhat hard  Food Insecurity: Food Insecurity Present (05/24/2021)   Hunger Vital Sign    Worried About Running Out of Food in the Last Year: Sometimes true    Ran Out of Food in the Last Year: Never true  Transportation Needs: No Transportation Needs (05/24/2021)   PRAPARE - Hydrologist (Medical): No    Lack of Transportation (Non-Medical): No  Physical Activity: Insufficiently Active (05/24/2021)   Exercise Vital Sign    Days of Exercise per Week: 7 days    Minutes of Exercise per Session: 20 min  Stress: No Stress Concern Present (05/24/2021)   [place]    Feeling of Stress : Not at all  Social Connections: [place] (05/24/2021)   Social Connection and Isolation Panel [NHANES]    Frequency of Communication with Friends and Family: More than three times a week    Frequency of Social Gatherings with Friends and Family: More than three times a week    Attends Religious Services: More than 4 times per year    Active Member of [employer] or Organizations: Yes    Attends Music therapist: More than 4 times per year    Marital Status: Married   Review of systems General: negative for malaise, night sweats, fever, chills, weight loss Neck: Negative for lumps, goiter, pain and significant neck swelling Resp: Negative for cough, wheezing, dyspnea at rest CV: Negative for chest pain, leg swelling, palpitations, orthopnea GI: denies melena, hematochezia, nausea,  vomiting, diarrhea, constipation, dysphagia, odyonophagia, early satiety or unintentional weight loss.  MSK: Negative for joint pain or swelling, back pain, and muscle pain. Derm: Negative for itching or rash Psych: Denies depression, anxiety, memory loss, confusion. No homicidal or suicidal ideation.  Heme: Negative for prolonged bleeding, bruising easily, and swollen nodes. Endocrine: Negative for cold or heat intolerance, polyuria, polydipsia and goiter. Neuro: negative for tremor, gait imbalance, syncope and seizures. The remainder of the review of systems is noncontributory.  Physical Exam: BP (!) 142/70 (BP Location: Right Arm, Patient Position: Sitting, Cuff Size: Normal) Comment: recheck 134/66  Pulse (!) 54   Temp (!) 97.3 F (36.3 C) (Oral)   Ht '5\' 8"'$  (1.727 m)   Wt 150 lb (68 kg) Comment: per patient  BMI 22.81 kg/m  General:   Alert and oriented. No distress noted. Pleasant and cooperative. In wheelchair  Head:  Normocephalic and atraumatic. Eyes:  Conjuctiva clear without scleral icterus. Mouth:  Oral mucosa pink and moist. Good dentition. No lesions. Heart: Normal rate and rhythm, s1 and s2 heart sounds present.  Lungs: Clear lung sounds in all lobes. Respirations equal and unlabored. Abdomen:  +BS, soft, non-tender and non-distended. No rebound or guarding. No HSM or masses noted. Derm: No palmar erythema or jaundice Msk:  Symmetrical without gross deformities. Normal posture. Extremities:  Without edema. Neurologic:  Alert and  oriented x4 Psych:  Alert and cooperative. Normal mood and affect.  Invalid input(s): "6 MONTHS"   ASSESSMENT: Theodore Mendoza is a 81 y.o. male presenting today for follow up of IDA secondary to GI bleeding  Patient with ongoing history of IDA secondary to GI blood loss, previous extensive workup to include EGDx2, Colonoscopy, Push enteroscopy x2, and givens capsule study with bleeding polyp in stomach in 2021 and AVM in duodenum in 2022  thought to be contributing to his ongoing IDA. He has remained on PPI BID and daily iron pills, most recent hgb 11.9 in  June. He has no rectal bleeding, melena, weight loss, dizziness, SOB or fatigue. Will continue daily iron and PPI BID, will update h&h and Iron studies today.    PLAN:  Continue PPI BID  2. Continue Daily iron  3. H&H, Iron studies   All questions were answered, patient verbalized understanding and is in agreement with plan as outlined above.   Follow Up: 1 year  Lorijean Husser L. Alver Sorrow, MSN, APRN, AGNP-C Adult-Gerontology Nurse Practitioner [facility]  I have reviewed the note and agree with the APP's assessment as described in this progress note  Maylon Peppers, MD Gastroenterology and Hepatology [facility]

## 2022-03-08 NOTE — Patient Instructions (Addendum)
Please continue your iron pill once daily Continue pantoprazole twice daily  We will check blood counts and iron studies   Follow up 1 year

## 2022-03-09 LAB — IRON,TIBC AND FERRITIN PANEL
%SAT: 17 % (calc) — ABNORMAL LOW (ref 20–48)
Ferritin: 28 ng/mL (ref 24–380)
Iron: 53 ug/dL (ref 50–180)
TIBC: 312 mcg/dL (calc) (ref 250–425)

## 2022-03-09 LAB — HEMOGLOBIN AND HEMATOCRIT, BLOOD
HCT: 35.9 % — ABNORMAL LOW (ref 38.5–50.0)
Hemoglobin: 11.8 g/dL — ABNORMAL LOW (ref 13.2–17.1)

## 2022-03-14 ENCOUNTER — Telehealth: Payer: Self-pay | Admitting: Family Medicine

## 2022-03-14 NOTE — Telephone Encounter (Signed)
Patient aware no samples available at this time

## 2022-03-16 ENCOUNTER — Other Ambulatory Visit: Payer: Self-pay

## 2022-03-16 DIAGNOSIS — I7143 Infrarenal abdominal aortic aneurysm, without rupture: Secondary | ICD-10-CM

## 2022-03-22 NOTE — Pre-Procedure Instructions (Signed)
Surgical Instructions    Your procedure is scheduled on April 04, 2022.  Report to [facility] Main Entrance "A" at 6:30 A.M., then check in with the Admitting office.  Call this number if you have problems the morning of surgery:  [phone]   If you have any questions prior to your surgery date call [phone]: Open Monday-Friday 8am-4pm    Remember:  Do not eat or drink after midnight the night before your surgery      Take these medicines the morning of surgery with A SIP OF WATER:  amLODipine (NORVASC)   dofetilide (TIKOSYN)   escitalopram (LEXAPRO)   gabapentin (NEURONTIN)   pantoprazole (PROTONIX)   pravastatin (PRAVACHOL)   sucralfate (CARAFATE)   tamsulosin [facility])    May take if needed:  albuterol (VENTOLIN HFA) inhaler    Follow your surgeon's instructions on when to stop apixaban (ELIQUIS).  If no instructions were given by your surgeon then you will need to call the office to get those instructions.     As of today, STOP taking any Aspirin (unless otherwise instructed by your surgeon) Aleve, Naproxen, Ibuprofen, Motrin, Advil, Goody's, BC's, all herbal medications, fish oil, and all vitamins.                     Do NOT Smoke (Tobacco/Vaping) for 24 hours prior to your procedure.  If you use a CPAP at night, you may bring your mask/headgear for your overnight stay.   Contacts, glasses, piercing's, hearing aid's, dentures or partials may not be worn into surgery, please bring cases for these belongings.    For patients admitted to the hospital, discharge time will be determined by your treatment team.   Patients discharged the day of surgery will not be allowed to drive home, and someone needs to stay with them for 24 hours.  SURGICAL WAITING ROOM VISITATION Patients having surgery or a procedure may have no more than 2 support people in the waiting area - these visitors may rotate.   Children under the age of 89 must have an adult with them who is not  the patient. If the patient needs to stay at the hospital during part of their recovery, the visitor guidelines for inpatient rooms apply. Pre-op nurse will coordinate an appropriate time for 1 support person to accompany patient in pre-op.  This support person may not rotate.   Please refer to the [facility] website for the visitor guidelines for Inpatients (after your surgery is over and you are in a regular room).    Special instructions:   [place]- Preparing For Surgery  Before surgery, you can play an important role. Because skin is not sterile, your skin needs to be as free of germs as possible. You can reduce the number of germs on your skin by washing with CHG (chlorahexidine gluconate) Soap before surgery.  CHG is an antiseptic cleaner which kills germs and bonds with the skin to continue killing germs even after washing.    Oral Hygiene is also important to reduce your risk of infection.  Remember - BRUSH YOUR TEETH THE MORNING OF SURGERY WITH YOUR REGULAR TOOTHPASTE  Please do not use if you have an allergy to CHG or antibacterial soaps. If your skin becomes reddened/irritated stop using the CHG.  Do not shave (including legs and underarms) for at least 48 hours prior to first CHG shower. It is OK to shave your face.  Please follow these instructions carefully.   Shower the  NIGHT BEFORE SURGERY and the MORNING OF SURGERY  If you chose to wash your hair, wash your hair first as usual with your normal shampoo.  After you shampoo, rinse your hair and body thoroughly to remove the shampoo.  Use CHG Soap as you would any other liquid soap. You can apply CHG directly to the skin and wash gently with a scrungie or a clean washcloth.   Apply the CHG Soap to your body ONLY FROM THE NECK DOWN.  Do not use on open wounds or open sores. Avoid contact with your eyes, ears, mouth and genitals (private parts). Wash Face and genitals (private parts)  with your normal soap.   Wash  thoroughly, paying special attention to the area where your surgery will be performed.  Thoroughly rinse your body with warm water from the neck down.  DO NOT shower/wash with your normal soap after using and rinsing off the CHG Soap.  Pat yourself dry with a CLEAN TOWEL.  Wear CLEAN PAJAMAS to bed the night before surgery  Place CLEAN SHEETS on your bed the night before your surgery  DO NOT SLEEP WITH PETS.   Day of Surgery: Take a shower with CHG soap. Do not wear jewelry or makeup Do not wear lotions, powders, perfumes/colognes, or deodorant. Do not shave 48 hours prior to surgery.  Men may shave face and neck. Do not bring valuables to the hospital.  [facility] is not responsible for any belongings or valuables. Do not wear nail polish, gel polish, artificial nails, or any other type of covering on natural nails (fingers and toes) If you have artificial nails or gel coating that need to be removed by a nail salon, please have this removed prior to surgery. Artificial nails or gel coating may interfere with anesthesia's ability to adequately monitor your vital signs.  Wear Clean/Comfortable clothing the morning of surgery Remember to brush your teeth WITH YOUR REGULAR TOOTHPASTE.   Please read over the following fact sheets that you were given.    If you received a COVID test during your pre-op visit  it is requested that you wear a mask when out in public, stay away from anyone that may not be feeling well and notify your surgeon if you develop symptoms. If you have been in contact with anyone that has tested positive in the last 10 days please notify you surgeon.

## 2022-03-23 ENCOUNTER — Encounter (HOSPITAL_COMMUNITY): Payer: Self-pay

## 2022-03-23 ENCOUNTER — Telehealth: Payer: Self-pay

## 2022-03-23 ENCOUNTER — Encounter (HOSPITAL_COMMUNITY)
Admission: RE | Admit: 2022-03-23 | Discharge: 2022-03-23 | Disposition: A | Discharge status: Home / Self Care | Payer: Medicare Other | Source: Ambulatory Visit | Attending: Surgery | Admitting: Surgery

## 2022-03-23 ENCOUNTER — Other Ambulatory Visit: Payer: Self-pay

## 2022-03-23 VITALS — BP 125/55 | HR 55 | Temp 97.7°F | Resp 20 | Ht 68.0 in | Wt 150.0 lb

## 2022-03-23 DIAGNOSIS — I7143 Infrarenal abdominal aortic aneurysm, without rupture: Secondary | ICD-10-CM | POA: Insufficient documentation

## 2022-03-23 DIAGNOSIS — Z01812 Encounter for preprocedural laboratory examination: Secondary | ICD-10-CM | POA: Diagnosis not present

## 2022-03-23 DIAGNOSIS — Z01818 Encounter for other preprocedural examination: Secondary | ICD-10-CM

## 2022-03-23 HISTORY — DX: Unspecified atrial fibrillation: I48.91

## 2022-03-23 HISTORY — DX: Gastro-esophageal reflux disease without esophagitis: K21.9

## 2022-03-23 HISTORY — DX: Headache, unspecified: R51.9

## 2022-03-23 HISTORY — DX: Malignant (primary) neoplasm, unspecified: C80.1

## 2022-03-23 LAB — URINALYSIS, ROUTINE W REFLEX MICROSCOPIC
Bilirubin Urine: NEGATIVE
Glucose, UA: NEGATIVE mg/dL
Ketones, ur: NEGATIVE mg/dL
Nitrite: POSITIVE — AB
Protein, ur: NEGATIVE mg/dL
Specific Gravity, Urine: 1.014 (ref 1.005–1.030)
WBC, UA: >50 WBC/hpf — ABNORMAL HIGH (ref 0–5)
pH: 6 (ref 5.0–8.0)

## 2022-03-23 LAB — COMPREHENSIVE METABOLIC PANEL
ALT: 10 U/L (ref 0–44)
AST: 15 U/L (ref 15–41)
Albumin: 3.8 g/dL (ref 3.5–5.0)
Alkaline Phosphatase: 61 U/L (ref 38–126)
Anion gap: 7 (ref 5–15)
BUN: 14 mg/dL (ref 8–23)
CO2: 29 mmol/L (ref 22–32)
Calcium: 9 mg/dL (ref 8.9–10.3)
Chloride: 105 mmol/L (ref 98–111)
Creatinine, Ser: 0.79 mg/dL (ref 0.61–1.24)
GFR, Estimated: >60 mL/min (ref >60)
Glucose, Bld: 94 mg/dL (ref 70–99)
Potassium: 3.5 mmol/L (ref 3.5–5.1)
Sodium: 141 mmol/L (ref 135–145)
Total Bilirubin: 0.8 mg/dL (ref 0.3–1.2)
Total Protein: 6.2 g/dL — ABNORMAL LOW (ref 6.5–8.1)

## 2022-03-23 LAB — SURGICAL PCR SCREEN
MRSA, PCR: NEGATIVE
Staphylococcus aureus: NEGATIVE

## 2022-03-23 LAB — TYPE AND SCREEN
ABO/RH(D): O NEG
Antibody Screen: NEGATIVE

## 2022-03-23 LAB — CBC
HCT: 39.3 % (ref 39.0–52.0)
Hemoglobin: 12.2 g/dL — ABNORMAL LOW (ref 13.0–17.0)
MCH: 29 pg (ref 26.0–34.0)
MCHC: 31 g/dL (ref 30.0–36.0)
MCV: 93.6 fL (ref 80.0–100.0)
Platelets: 181 10*3/uL (ref 150–400)
RBC: 4.2 MIL/uL — ABNORMAL LOW (ref 4.22–5.81)
RDW: 13.7 % (ref 11.5–15.5)
WBC: 7.4 10*3/uL (ref 4.0–10.5)
nRBC: 0 % (ref 0.0–0.2)

## 2022-03-23 LAB — PROTIME-INR
INR: 1.6 — ABNORMAL HIGH (ref 0.8–1.2)
Prothrombin Time: 18.8 seconds — ABNORMAL HIGH (ref 11.4–15.2)

## 2022-03-23 LAB — APTT: aPTT: 39 seconds — ABNORMAL HIGH (ref 24–36)

## 2022-03-23 MED ORDER — SULFAMETHOXAZOLE-TRIMETHOPRIM 800-160 MG PO TABS: 1.0000 | ORAL_TABLET | Freq: Two times a day (BID) | ORAL | 0 refills | Status: AC

## 2022-03-23 NOTE — Telephone Encounter (Signed)
Received notification from preadmission testing regarding patient's abnormal pre-op urinalysis results. Spoke with patient regarding urinalysis results and will send a Rx for Bactrim DS, take 1 tablet by mouth twice daily x 5 days to pharmacy. Patient verbalized understanding.

## 2022-03-23 NOTE — Progress Notes (Signed)
PCP - Dr. Claretta Fraise Cardiologist - Dr. Lilla Shook Nathaniel Man Shantha  PPM/ICD - denies   Chest x-ray - 08/12/20 EKG - 05/29/21 Stress Test - denies ECHO - 11/12/19 Cardiac Cath - denies  Sleep Study - denies  DM- denies    Blood Thinner Instructions: Eliquis- Hold 5 days. Last dose 12/28 Aspirin Instructions: n/a  ERAS Protcol - no, NPO   COVID TEST- n/a   Anesthesia review: yes, cardiac hx  Patient denies shortness of breath, fever, cough and chest pain at PAT appointment   All instructions explained to the patient, with a verbal understanding of the material. Patient agrees to go over the instructions while at home for a better understanding. The opportunity to ask questions was provided.

## 2022-03-27 NOTE — Progress Notes (Signed)
Anesthesia Chart Review:  Follows with cardiology at [facility] for history of persistent afib on Tikosyn and Eliquis, HTN, CAD s/p NSTEMI 2008 treate with PTCA of distal circumflex (otherwise non-obstructive disease at that time), CVA. Last seen by Dr. Jonette Eva 05/29/21, stable at that time, no changes to management, 1 year followup recommended.  History of Guillian-Barre syndrome with functional impairment. Wheelchair bound.   Hx of stable meningioma, followed by neurology.  Preop labs reviewed, mild anemia hgb 12.2. INR noted to be elevated at 1.6. Pt's LD of Eliquis is on 03/29/22. Urinalysis concerning for UTI, vascular treating with bactrim.   EKG 08/11/20: NSR. Rate 84.  TTE 11/12/19 (care everywhere): SUMMARY  The left ventricular size is normal.  There is normal left ventricular wall thickness.  Left ventricular systolic function is normal.  LV ejection fraction = 60-65%.  The left ventricular wall motion is normal.  Left ventricular filling pattern is indeterminate.  The right ventricle is normal in size and function.  No significant valvular stenosis or regurgitation.  The aortic sinus is normal size.  Pulmonary veins were not well visualized during exam.  The inferior vena cava was not visualized during the exam.  There is no pericardial effusion.  No prior study available for comparison.    Wynonia Musty [facility] Short Stay Center/Anesthesiology Phone [phone] 03/27/2022 11:18 AM

## 2022-03-27 NOTE — Anesthesia Preprocedure Evaluation (Addendum)
Anesthesia Evaluation  Patient identified by MRN, date of birth, ID band Patient awake    Reviewed: Allergy & Precautions, NPO status , Patient's Chart, lab work & pertinent test results  History of Anesthesia Complications Negative for: history of anesthetic complications  Airway Mallampati: III  TM Distance: >3 FB Neck ROM: Full    Dental  (+) Dental Advisory Given   Pulmonary neg pulmonary ROS   Pulmonary exam normal        Cardiovascular hypertension, Pt. on medications + Past MI and +CHF  Normal cardiovascular exam+ dysrhythmias Atrial Fibrillation    '23 AAA US - Abdominal Aorta: There is evidence of abnormal dilatation of the distal Abdominal aorta, largest diameter measuring 5.3cm. The largest aortic diameter has increased compared to prior exam.   '21 TTE - EF 60-65%, no significant valvulopathy    Neuro/Psych  Headaches PSYCHIATRIC DISORDERS Anxiety Depression     Incomplete paraplegia Wheelchair dependent Brain tumor  TIA Neuromuscular disease (hx GBS)    GI/Hepatic Neg liver ROS,GERD  Medicated and Controlled,,  Endo/Other  negative endocrine ROS    Renal/GU negative Renal ROS     Musculoskeletal negative musculoskeletal ROS (+)    Abdominal   Peds  Hematology  INR 1.6 12/22 On eliquis    Anesthesia Other Findings   Reproductive/Obstetrics                             Anesthesia Physical Anesthesia Plan  ASA: 3  Anesthesia Plan: General   Post-op Pain Management: Tylenol PO (pre-op)*   Induction: Intravenous  PONV Risk Score and Plan: 2 and Treatment may vary due to age or medical condition, Ondansetron and Propofol infusion  Airway Management Planned: Oral ETT  Additional Equipment: Arterial line  Intra-op Plan:   Post-operative Plan: Extubation in OR  Informed Consent: I have reviewed the patients History and Physical, chart, labs and discussed the  procedure including the risks, benefits and alternatives for the proposed anesthesia with the patient or authorized representative who has indicated his/her understanding and acceptance.     Dental advisory given  Plan Discussed with: CRNA and Anesthesiologist  Anesthesia Plan Comments: ( )        Anesthesia Quick Evaluation

## 2022-04-03 ENCOUNTER — Other Ambulatory Visit: Payer: Self-pay | Admitting: Family Medicine

## 2022-04-04 ENCOUNTER — Other Ambulatory Visit: Payer: Self-pay

## 2022-04-04 ENCOUNTER — Encounter (HOSPITAL_COMMUNITY)
Admission: RE | Disposition: A | Discharge status: Home / Self Care | Payer: Self-pay | Source: Home / Self Care | Attending: Surgery

## 2022-04-04 ENCOUNTER — Encounter (HOSPITAL_COMMUNITY): Payer: Self-pay | Admitting: Surgery

## 2022-04-04 ENCOUNTER — Inpatient Hospital Stay (HOSPITAL_COMMUNITY): Payer: Medicare Other

## 2022-04-04 ENCOUNTER — Inpatient Hospital Stay (HOSPITAL_COMMUNITY)
Admission: RE | Admit: 2022-04-04 | Discharge: 2022-04-05 | DRG: 269 | Disposition: A | Discharge status: Home / Self Care | Payer: Medicare Other | Attending: Surgery | Admitting: Surgery

## 2022-04-04 ENCOUNTER — Inpatient Hospital Stay (HOSPITAL_COMMUNITY): Payer: Medicare Other | Admitting: Physician Assistant

## 2022-04-04 DIAGNOSIS — I7143 Infrarenal abdominal aortic aneurysm, without rupture: Secondary | ICD-10-CM | POA: Diagnosis not present

## 2022-04-04 DIAGNOSIS — G8220 Paraplegia, unspecified: Secondary | ICD-10-CM | POA: Diagnosis not present

## 2022-04-04 DIAGNOSIS — Z8673 Personal history of transient ischemic attack (TIA), and cerebral infarction without residual deficits: Secondary | ICD-10-CM

## 2022-04-04 DIAGNOSIS — I2510 Atherosclerotic heart disease of native coronary artery without angina pectoris: Secondary | ICD-10-CM | POA: Diagnosis present

## 2022-04-04 DIAGNOSIS — Z993 Dependence on wheelchair: Secondary | ICD-10-CM | POA: Diagnosis not present

## 2022-04-04 DIAGNOSIS — Z79899 Other long term (current) drug therapy: Secondary | ICD-10-CM

## 2022-04-04 DIAGNOSIS — I4819 Other persistent atrial fibrillation: Secondary | ICD-10-CM | POA: Diagnosis present

## 2022-04-04 DIAGNOSIS — I10 Essential (primary) hypertension: Secondary | ICD-10-CM | POA: Diagnosis present

## 2022-04-04 DIAGNOSIS — I110 Hypertensive heart disease with heart failure: Secondary | ICD-10-CM | POA: Diagnosis not present

## 2022-04-04 DIAGNOSIS — G650 Sequelae of Guillain-Barre syndrome: Secondary | ICD-10-CM | POA: Diagnosis present

## 2022-04-04 DIAGNOSIS — F32A Depression, unspecified: Secondary | ICD-10-CM | POA: Diagnosis not present

## 2022-04-04 DIAGNOSIS — Z8249 Family history of ischemic heart disease and other diseases of the circulatory system: Secondary | ICD-10-CM

## 2022-04-04 DIAGNOSIS — Z955 Presence of coronary angioplasty implant and graft: Secondary | ICD-10-CM

## 2022-04-04 DIAGNOSIS — I252 Old myocardial infarction: Secondary | ICD-10-CM

## 2022-04-04 DIAGNOSIS — Z87442 Personal history of urinary calculi: Secondary | ICD-10-CM

## 2022-04-04 DIAGNOSIS — Z823 Family history of stroke: Secondary | ICD-10-CM

## 2022-04-04 DIAGNOSIS — I714 Abdominal aortic aneurysm, without rupture, unspecified: Principal | ICD-10-CM | POA: Diagnosis present

## 2022-04-04 DIAGNOSIS — Z885 Allergy status to narcotic agent status: Secondary | ICD-10-CM | POA: Diagnosis not present

## 2022-04-04 DIAGNOSIS — F419 Anxiety disorder, unspecified: Secondary | ICD-10-CM | POA: Diagnosis not present

## 2022-04-04 DIAGNOSIS — I4891 Unspecified atrial fibrillation: Secondary | ICD-10-CM | POA: Diagnosis present

## 2022-04-04 DIAGNOSIS — I509 Heart failure, unspecified: Secondary | ICD-10-CM

## 2022-04-04 DIAGNOSIS — Z85820 Personal history of malignant melanoma of skin: Secondary | ICD-10-CM | POA: Diagnosis not present

## 2022-04-04 DIAGNOSIS — Z7901 Long term (current) use of anticoagulants: Secondary | ICD-10-CM | POA: Diagnosis not present

## 2022-04-04 DIAGNOSIS — Z888 Allergy status to other drugs, medicaments and biological substances status: Secondary | ICD-10-CM

## 2022-04-04 DIAGNOSIS — Z8679 Personal history of other diseases of the circulatory system: Principal | ICD-10-CM

## 2022-04-04 DIAGNOSIS — D329 Benign neoplasm of meninges, unspecified: Secondary | ICD-10-CM | POA: Diagnosis present

## 2022-04-04 DIAGNOSIS — K219 Gastro-esophageal reflux disease without esophagitis: Secondary | ICD-10-CM | POA: Diagnosis present

## 2022-04-04 HISTORY — PX: ABDOMINAL AORTIC ENDOVASCULAR STENT GRAFT: SHX5707

## 2022-04-04 LAB — BASIC METABOLIC PANEL
Anion gap: 7 (ref 5–15)
BUN: 17 mg/dL (ref 8–23)
CO2: 28 mmol/L (ref 22–32)
Calcium: 8.4 mg/dL — ABNORMAL LOW (ref 8.9–10.3)
Chloride: 103 mmol/L (ref 98–111)
Creatinine, Ser: 0.85 mg/dL (ref 0.61–1.24)
GFR, Estimated: >60 mL/min (ref >60)
Glucose, Bld: 132 mg/dL — ABNORMAL HIGH (ref 70–99)
Potassium: 3.6 mmol/L (ref 3.5–5.1)
Sodium: 138 mmol/L (ref 135–145)

## 2022-04-04 LAB — CBC
HCT: 33.4 % — ABNORMAL LOW (ref 39.0–52.0)
Hemoglobin: 10.4 g/dL — ABNORMAL LOW (ref 13.0–17.0)
MCH: 29.3 pg (ref 26.0–34.0)
MCHC: 31.1 g/dL (ref 30.0–36.0)
MCV: 94.1 fL (ref 80.0–100.0)
Platelets: 105 10*3/uL — ABNORMAL LOW (ref 150–400)
RBC: 3.55 MIL/uL — ABNORMAL LOW (ref 4.22–5.81)
RDW: 13.2 % (ref 11.5–15.5)
WBC: 6.3 10*3/uL (ref 4.0–10.5)
nRBC: 0 % (ref 0.0–0.2)

## 2022-04-04 LAB — PROTIME-INR
INR: 1.3 — ABNORMAL HIGH (ref 0.8–1.2)
Prothrombin Time: 16.4 seconds — ABNORMAL HIGH (ref 11.4–15.2)

## 2022-04-04 LAB — MAGNESIUM: Magnesium: 1.7 mg/dL (ref 1.7–2.4)

## 2022-04-04 LAB — APTT: aPTT: 39 seconds — ABNORMAL HIGH (ref 24–36)

## 2022-04-04 LAB — POCT ACTIVATED CLOTTING TIME: Activated Clotting Time: 233 seconds

## 2022-04-04 SURGERY — INSERTION, ENDOVASCULAR STENT GRAFT, AORTA, ABDOMINAL
Anesthesia: General | Site: Groin | Laterality: Bilateral

## 2022-04-04 MED ORDER — PROPOFOL 10 MG/ML IV BOLUS
INTRAVENOUS | Status: DC | PRN
  Administered 2022-04-04: 100 mg via INTRAVENOUS

## 2022-04-04 MED ORDER — ONDANSETRON HCL 4 MG/2ML IJ SOLN: 4.0000 mg | Freq: Four times a day (QID) | INTRAMUSCULAR | Status: DC | PRN

## 2022-04-04 MED ORDER — FENTANYL CITRATE (PF) 100 MCG/2ML IJ SOLN: 25.0000 ug | INTRAMUSCULAR | Status: DC | PRN

## 2022-04-04 MED ORDER — CHLORHEXIDINE GLUCONATE 0.12 % MT SOLN
OROMUCOSAL | Status: AC
  Administered 2022-04-04: 15 mL
  Filled 2022-04-04: qty 15

## 2022-04-04 MED ORDER — SODIUM CHLORIDE 0.9 % IV SOLN: INTRAVENOUS | Status: DC

## 2022-04-04 MED ORDER — GABAPENTIN 300 MG PO CAPS
300.0000 mg | ORAL_CAPSULE | Freq: Three times a day (TID) | ORAL | Status: DC
  Administered 2022-04-04 – 2022-04-05 (×3): 300 mg via ORAL
  Filled 2022-04-04 (×3): qty 1

## 2022-04-04 MED ORDER — EPHEDRINE 5 MG/ML INJ
INTRAVENOUS | Status: AC
  Filled 2022-04-04: qty 5

## 2022-04-04 MED ORDER — PANTOPRAZOLE SODIUM 40 MG PO TBEC
40.0000 mg | DELAYED_RELEASE_TABLET | Freq: Two times a day (BID) | ORAL | Status: DC
  Administered 2022-04-04: 40 mg via ORAL
  Filled 2022-04-04 (×2): qty 1

## 2022-04-04 MED ORDER — ONDANSETRON HCL 4 MG/2ML IJ SOLN: 4.0000 mg | Freq: Once | INTRAMUSCULAR | Status: DC | PRN

## 2022-04-04 MED ORDER — ROCURONIUM BROMIDE 10 MG/ML (PF) SYRINGE
PREFILLED_SYRINGE | INTRAVENOUS | Status: DC | PRN
  Administered 2022-04-04 (×2): 50 mg via INTRAVENOUS

## 2022-04-04 MED ORDER — SODIUM CHLORIDE 0.9 % IV SOLN: 500.0000 mL | Freq: Once | INTRAVENOUS | Status: DC | PRN

## 2022-04-04 MED ORDER — HEPARIN SODIUM (PORCINE) 5000 UNIT/ML IJ SOLN: 5000.0000 [IU] | Freq: Three times a day (TID) | INTRAMUSCULAR | Status: DC

## 2022-04-04 MED ORDER — CEFAZOLIN SODIUM-DEXTROSE 2-4 GM/100ML-% IV SOLN
2.0000 g | INTRAVENOUS | Status: AC
  Administered 2022-04-04: 2 g via INTRAVENOUS
  Filled 2022-04-04: qty 100

## 2022-04-04 MED ORDER — SODIUM CHLORIDE 0.9% FLUSH: 3.0000 mL | INTRAVENOUS | Status: DC | PRN

## 2022-04-04 MED ORDER — CHLORHEXIDINE GLUCONATE CLOTH 2 % EX PADS: 6.0000 | MEDICATED_PAD | Freq: Once | CUTANEOUS | Status: DC

## 2022-04-04 MED ORDER — 0.9 % SODIUM CHLORIDE (POUR BTL) OPTIME
TOPICAL | Status: DC | PRN
  Administered 2022-04-04: 1000 mL

## 2022-04-04 MED ORDER — GUAIFENESIN-DM 100-10 MG/5ML PO SYRP: 15.0000 mL | ORAL_SOLUTION | ORAL | Status: DC | PRN

## 2022-04-04 MED ORDER — MAGNESIUM SULFATE 2 GM/50ML IV SOLN
2.0000 g | Freq: Once | INTRAVENOUS | Status: AC
  Administered 2022-04-04: 2 g via INTRAVENOUS
  Filled 2022-04-04: qty 50

## 2022-04-04 MED ORDER — PROTAMINE SULFATE 10 MG/ML IV SOLN
INTRAVENOUS | Status: DC | PRN
  Administered 2022-04-04: 50 mg via INTRAVENOUS

## 2022-04-04 MED ORDER — FENTANYL CITRATE (PF) 250 MCG/5ML IJ SOLN
INTRAMUSCULAR | Status: AC
  Filled 2022-04-04: qty 5

## 2022-04-04 MED ORDER — SUCRALFATE 1 G PO TABS
1.0000 g | ORAL_TABLET | Freq: Two times a day (BID) | ORAL | Status: DC
  Administered 2022-04-05: 1 g via ORAL
  Filled 2022-04-04 (×3): qty 1

## 2022-04-04 MED ORDER — HEPARIN 6000 UNIT IRRIGATION SOLUTION
Status: DC | PRN
  Administered 2022-04-04: 1

## 2022-04-04 MED ORDER — POTASSIUM CHLORIDE 20 MEQ PO PACK
40.0000 meq | PACK | Freq: Once | ORAL | Status: AC
  Administered 2022-04-04: 40 meq via ORAL
  Filled 2022-04-04: qty 2

## 2022-04-04 MED ORDER — ALUM & MAG HYDROXIDE-SIMETH 200-200-20 MG/5ML PO SUSP: 15.0000 mL | ORAL | Status: DC | PRN

## 2022-04-04 MED ORDER — SENNOSIDES-DOCUSATE SODIUM 8.6-50 MG PO TABS: 1.0000 | ORAL_TABLET | Freq: Every evening | ORAL | Status: DC | PRN

## 2022-04-04 MED ORDER — POTASSIUM CHLORIDE 10 MEQ/50ML IV SOLN
10.0000 meq | INTRAVENOUS | Status: DC
  Filled 2022-04-04 (×4): qty 50

## 2022-04-04 MED ORDER — PHENOL 1.4 % MT LIQD: 1.0000 | OROMUCOSAL | Status: DC | PRN

## 2022-04-04 MED ORDER — SODIUM CHLORIDE 0.9% FLUSH
3.0000 mL | Freq: Two times a day (BID) | INTRAVENOUS | Status: DC
  Administered 2022-04-04: 3 mL via INTRAVENOUS

## 2022-04-04 MED ORDER — MAGNESIUM SULFATE 2 GM/50ML IV SOLN: 2.0000 g | Freq: Every day | INTRAVENOUS | Status: DC | PRN

## 2022-04-04 MED ORDER — SUGAMMADEX SODIUM 200 MG/2ML IV SOLN
INTRAVENOUS | Status: DC | PRN
  Administered 2022-04-04: 200 mg via INTRAVENOUS

## 2022-04-04 MED ORDER — METOPROLOL TARTRATE 5 MG/5ML IV SOLN: 2.0000 mg | INTRAVENOUS | Status: DC | PRN

## 2022-04-04 MED ORDER — OXYCODONE HCL 5 MG PO TABS: 5.0000 mg | ORAL_TABLET | Freq: Once | ORAL | Status: DC | PRN

## 2022-04-04 MED ORDER — ONDANSETRON HCL 4 MG/2ML IJ SOLN
INTRAMUSCULAR | Status: DC | PRN
  Administered 2022-04-04: 4 mg via INTRAVENOUS

## 2022-04-04 MED ORDER — DOCUSATE SODIUM 100 MG PO CAPS
100.0000 mg | ORAL_CAPSULE | Freq: Every day | ORAL | Status: DC
  Administered 2022-04-05: 100 mg via ORAL

## 2022-04-04 MED ORDER — PROPOFOL 10 MG/ML IV BOLUS
INTRAVENOUS | Status: AC
  Filled 2022-04-04: qty 20

## 2022-04-04 MED ORDER — OXYCODONE HCL 5 MG/5ML PO SOLN: 5.0000 mg | Freq: Once | ORAL | Status: DC | PRN

## 2022-04-04 MED ORDER — IODIXANOL 320 MG/ML IV SOLN
INTRAVENOUS | Status: DC | PRN
  Administered 2022-04-04: 58 mL via INTRAVENOUS

## 2022-04-04 MED ORDER — ESCITALOPRAM OXALATE 20 MG PO TABS
20.0000 mg | ORAL_TABLET | Freq: Every day | ORAL | Status: DC
  Administered 2022-04-05: 20 mg via ORAL
  Filled 2022-04-04: qty 1

## 2022-04-04 MED ORDER — TAMSULOSIN HCL 0.4 MG PO CAPS
0.4000 mg | ORAL_CAPSULE | Freq: Every day | ORAL | Status: DC
  Administered 2022-04-05: 0.4 mg via ORAL
  Filled 2022-04-04: qty 1

## 2022-04-04 MED ORDER — SODIUM CHLORIDE 0.9 % IV SOLN: 250.0000 mL | INTRAVENOUS | Status: DC | PRN

## 2022-04-04 MED ORDER — HYDRALAZINE HCL 20 MG/ML IJ SOLN: 5.0000 mg | INTRAMUSCULAR | Status: DC | PRN

## 2022-04-04 MED ORDER — LABETALOL HCL 5 MG/ML IV SOLN: 10.0000 mg | INTRAVENOUS | Status: DC | PRN

## 2022-04-04 MED ORDER — EPHEDRINE SULFATE-NACL 50-0.9 MG/10ML-% IV SOSY
PREFILLED_SYRINGE | INTRAVENOUS | Status: DC | PRN
  Administered 2022-04-04 (×3): 10 mg via INTRAVENOUS

## 2022-04-04 MED ORDER — PHENYLEPHRINE 80 MCG/ML (10ML) SYRINGE FOR IV PUSH (FOR BLOOD PRESSURE SUPPORT)
PREFILLED_SYRINGE | INTRAVENOUS | Status: DC | PRN
  Administered 2022-04-04: 200 ug via INTRAVENOUS

## 2022-04-04 MED ORDER — PRAVASTATIN SODIUM 40 MG PO TABS: 40.0000 mg | ORAL_TABLET | Freq: Every day | ORAL | Status: DC

## 2022-04-04 MED ORDER — HEPARIN SODIUM (PORCINE) 1000 UNIT/ML IJ SOLN
INTRAMUSCULAR | Status: AC
  Filled 2022-04-04: qty 10

## 2022-04-04 MED ORDER — ACETAMINOPHEN 325 MG PO TABS
325.0000 mg | ORAL_TABLET | ORAL | Status: DC | PRN
  Administered 2022-04-04: 650 mg via ORAL
  Filled 2022-04-04: qty 2

## 2022-04-04 MED ORDER — TRAMADOL HCL 50 MG PO TABS: 50.0000 mg | ORAL_TABLET | Freq: Four times a day (QID) | ORAL | Status: DC | PRN

## 2022-04-04 MED ORDER — PANTOPRAZOLE SODIUM 40 MG PO TBEC: 40.0000 mg | DELAYED_RELEASE_TABLET | Freq: Every day | ORAL | Status: DC

## 2022-04-04 MED ORDER — FENTANYL CITRATE (PF) 250 MCG/5ML IJ SOLN
INTRAMUSCULAR | Status: DC | PRN
  Administered 2022-04-04 (×3): 50 ug via INTRAVENOUS

## 2022-04-04 MED ORDER — LACTATED RINGERS IV SOLN: INTRAVENOUS | Status: DC | PRN

## 2022-04-04 MED ORDER — LISINOPRIL 5 MG PO TABS
5.0000 mg | ORAL_TABLET | Freq: Every day | ORAL | Status: DC
  Administered 2022-04-04 – 2022-04-05 (×2): 5 mg via ORAL
  Filled 2022-04-04 (×2): qty 1

## 2022-04-04 MED ORDER — APIXABAN 5 MG PO TABS
5.0000 mg | ORAL_TABLET | Freq: Two times a day (BID) | ORAL | Status: DC
  Administered 2022-04-05: 5 mg via ORAL
  Filled 2022-04-04: qty 1

## 2022-04-04 MED ORDER — PHENYLEPHRINE HCL-NACL 20-0.9 MG/250ML-% IV SOLN
INTRAVENOUS | Status: DC | PRN
  Administered 2022-04-04: 50 ug/min via INTRAVENOUS

## 2022-04-04 MED ORDER — DOFETILIDE 500 MCG PO CAPS
500.0000 ug | ORAL_CAPSULE | Freq: Two times a day (BID) | ORAL | Status: DC
  Administered 2022-04-04 – 2022-04-05 (×2): 500 ug via ORAL
  Filled 2022-04-04 (×3): qty 1

## 2022-04-04 MED ORDER — HEPARIN SODIUM (PORCINE) 1000 UNIT/ML IJ SOLN
INTRAMUSCULAR | Status: DC | PRN
  Administered 2022-04-04: 7000 [IU] via INTRAVENOUS

## 2022-04-04 MED ORDER — MIRTAZAPINE 15 MG PO TABS
7.5000 mg | ORAL_TABLET | Freq: Every day | ORAL | Status: DC
  Administered 2022-04-04: 7.5 mg via ORAL
  Filled 2022-04-04: qty 1

## 2022-04-04 MED ORDER — LIDOCAINE 2% (20 MG/ML) 5 ML SYRINGE
INTRAMUSCULAR | Status: DC | PRN
  Administered 2022-04-04: 60 mg via INTRAVENOUS

## 2022-04-04 MED ORDER — ACETAMINOPHEN 650 MG RE SUPP: 325.0000 mg | RECTAL | Status: DC | PRN

## 2022-04-04 MED ORDER — DEXAMETHASONE SODIUM PHOSPHATE 10 MG/ML IJ SOLN
INTRAMUSCULAR | Status: DC | PRN
  Administered 2022-04-04: 10 mg via INTRAVENOUS

## 2022-04-04 MED ORDER — HEPARIN 6000 UNIT IRRIGATION SOLUTION
Status: AC
  Filled 2022-04-04: qty 500

## 2022-04-04 MED ORDER — CEFAZOLIN SODIUM-DEXTROSE 2-4 GM/100ML-% IV SOLN
2.0000 g | Freq: Three times a day (TID) | INTRAVENOUS | Status: AC
  Administered 2022-04-04 – 2022-04-05 (×2): 2 g via INTRAVENOUS
  Filled 2022-04-04 (×2): qty 100

## 2022-04-04 MED ORDER — AMLODIPINE BESYLATE 5 MG PO TABS
5.0000 mg | ORAL_TABLET | Freq: Every day | ORAL | Status: DC
  Administered 2022-04-05: 5 mg via ORAL
  Filled 2022-04-04: qty 1

## 2022-04-04 MED ORDER — POTASSIUM CHLORIDE CRYS ER 20 MEQ PO TBCR: 20.0000 meq | EXTENDED_RELEASE_TABLET | Freq: Every day | ORAL | Status: DC | PRN

## 2022-04-04 MED ORDER — ACETAMINOPHEN 500 MG PO TABS
1000.0000 mg | ORAL_TABLET | Freq: Once | ORAL | Status: AC
  Administered 2022-04-04: 1000 mg via ORAL
  Filled 2022-04-04: qty 2

## 2022-04-04 SURGICAL SUPPLY — 58 items
ADH SKN CLS APL DERMABOND .7 (GAUZE/BANDAGES/DRESSINGS) ×2
BAG COUNTER SPONGE SURGICOUNT (BAG) ×2 IMPLANT
BAG SNAP BAND KOVER 36X36 (MISCELLANEOUS) IMPLANT
BAG SPNG CNTER NS LX DISP (BAG) ×1
BALLN MUSTANG 8X80X75 (BALLOONS) ×1
BALLOON MUSTANG 8X80X75 (BALLOONS) IMPLANT
BLADE CLIPPER SURG (BLADE) ×2 IMPLANT
CANISTER SUCT 3000ML PPV (MISCELLANEOUS) ×2 IMPLANT
CATH BEACON 5.038 65CM KMP-01 (CATHETERS) ×2 IMPLANT
CATH OMNI FLUSH .035X70CM (CATHETERS) ×2 IMPLANT
DERMABOND ADVANCED .7 DNX12 (GAUZE/BANDAGES/DRESSINGS) ×2 IMPLANT
DEVICE CLOSURE PERCLS PRGLD 6F (VASCULAR PRODUCTS) ×8 IMPLANT
DEVICE TORQUE H2O (MISCELLANEOUS) IMPLANT
DRSG TEGADERM 2-3/8X2-3/4 SM (GAUZE/BANDAGES/DRESSINGS) ×4 IMPLANT
DRYSEAL FLEXSHEATH 12FR 33CM (SHEATH) ×1
DRYSEAL FLEXSHEATH 16FR 33CM (SHEATH) ×1
ELECT CAUTERY BLADE 6.4 (BLADE) ×2 IMPLANT
ELECT REM PT RETURN 9FT ADLT (ELECTROSURGICAL) ×2
ELECTRODE REM PT RTRN 9FT ADLT (ELECTROSURGICAL) ×4 IMPLANT
EXCLUDER TNK LEG 26MX12X18 (Endovascular Graft) IMPLANT
EXCLUDER TRUNK LEG 26MX12X18 (Endovascular Graft) ×1 IMPLANT
GAUZE SPONGE 2X2 8PLY STRL LF (GAUZE/BANDAGES/DRESSINGS) ×4 IMPLANT
GLOVE SURG SS PI 7.5 STRL IVOR (GLOVE) ×6 IMPLANT
GOWN STRL REUS W/ TWL LRG LVL3 (GOWN DISPOSABLE) ×4 IMPLANT
GOWN STRL REUS W/ TWL XL LVL3 (GOWN DISPOSABLE) ×4 IMPLANT
GOWN STRL REUS W/TWL LRG LVL3 (GOWN DISPOSABLE) ×2
GOWN STRL REUS W/TWL XL LVL3 (GOWN DISPOSABLE) ×2
GRAFT BALLN CATH 65CM (STENTS) ×2 IMPLANT
GUIDEWIRE ANGLED .035X150CM (WIRE) IMPLANT
KIT BASIN OR (CUSTOM PROCEDURE TRAY) ×2 IMPLANT
KIT DRAIN CSF ACCUDRAIN (MISCELLANEOUS) IMPLANT
KIT ENCORE 26 ADVANTAGE (KITS) IMPLANT
KIT TURNOVER KIT B (KITS) ×2 IMPLANT
LEG CONTRALATERAL 16X14.5X14 (Vascular Products) IMPLANT
NS IRRIG 1000ML POUR BTL (IV SOLUTION) ×2 IMPLANT
PACK ENDOVASCULAR (PACKS) ×2 IMPLANT
PAD ARMBOARD 7.5X6 YLW CONV (MISCELLANEOUS) ×4 IMPLANT
PENCIL BUTTON HOLSTER BLD 10FT (ELECTRODE) ×2 IMPLANT
PERCLOSE PROGLIDE 6F (VASCULAR PRODUCTS) ×4
SET MICROPUNCTURE 5F STIFF (MISCELLANEOUS) ×2 IMPLANT
SHEATH BRITE TIP 8FR 23CM (SHEATH) ×2 IMPLANT
SHEATH DRYSEAL FLEX 12FR 33CM (SHEATH) IMPLANT
SHEATH DRYSEAL FLEX 16FR 33CM (SHEATH) IMPLANT
SHEATH PINNACLE 8F 10CM (SHEATH) ×2 IMPLANT
STENT GRAFT BALLN CATH 65CM (STENTS) ×1
STOPCOCK MORSE 400PSI 3WAY (MISCELLANEOUS) ×2 IMPLANT
SUT PROLENE 5 0 C 1 24 (SUTURE) IMPLANT
SUT VIC AB 2-0 CT1 27 (SUTURE)
SUT VIC AB 2-0 CT1 TAPERPNT 27 (SUTURE) IMPLANT
SUT VIC AB 3-0 SH 27 (SUTURE)
SUT VIC AB 3-0 SH 27X BRD (SUTURE) IMPLANT
SUT VICRYL 4-0 PS2 18IN ABS (SUTURE) IMPLANT
SYR 20ML LL LF (SYRINGE) ×2 IMPLANT
TOWEL GREEN STERILE (TOWEL DISPOSABLE) ×2 IMPLANT
TRAY FOLEY MTR SLVR 16FR STAT (SET/KITS/TRAYS/PACK) ×2 IMPLANT
TUBING HIGH PRESSURE 120CM (CONNECTOR) ×2 IMPLANT
WIRE AMPLATZ SS-J .035X180CM (WIRE) ×4 IMPLANT
WIRE BENTSON .035X145CM (WIRE) ×4 IMPLANT

## 2022-04-04 NOTE — Discharge Instructions (Signed)
  [facility] [place]   Discharge Instructions  Endovascular Aortic Aneurysm Repair  Please refer to the following instructions for your post-procedure care. Your surgeon or Physician Assistant will discuss any changes with you.  Activity  You are encouraged to walk as much as you can. You can slowly return to normal activities but must avoid strenuous activity and heavy lifting until your doctor tells you it's OK. Avoid activities such as vacuuming or swinging a gold club. It is normal to feel tired for several weeks after your surgery. Do not drive until your doctor gives the OK and you are no longer taking prescription pain medications. It is also normal to have difficulty with sleep habits, eating, and bowel movements after surgery. These will go away with time.  Bathing/Showering  Shower daily after you go home.  Do not soak in a bathtub, hot tub, or swim until the incision heals completely.  If you have incisions in your groin, wash the groin wounds with soap and water daily and pat dry. (No tub bath-only shower)  Then put a dry gauze or washcloth there to keep this area dry to help prevent wound infection daily and as needed.  Do not use Vaseline or neosporin on your incisions.  Only use soap and water on your incisions and then protect and keep dry.  Incision Care  Shower every day. Clean your incision with mild soap and water. Pat the area dry with a clean towel. You do not need a bandage unless otherwise instructed. Do not apply any ointments or creams to your incision. If you clothing is irritating, you may cover your incision with a dry gauze pad.  Diet  Resume your normal diet. There are no special food restrictions following this procedure. A low fat/low cholesterol diet is recommended for all patients with vascular disease. In order to heal from your surgery, it is CRITICAL to get adequate nutrition. Your body requires vitamins, minerals, and protein.  Vegetables are the best source of vitamins and minerals. Vegetables also provide the perfect balance of protein. Processed food has little nutritional value, so try to avoid this.  Medications  Resume taking all of your medications unless your doctor or nurse practitioner tells you not to. If your incision is causing pain, you may take over-the-counter pain relievers such as acetaminophen (Tylenol). If you were prescribed a stronger pain medication, please be aware these medications can cause nausea and constipation. Prevent nausea by taking the medication with a snack or meal. Avoid constipation by drinking plenty of fluids and eating foods with a high amount of fiber, such as fruits, vegetables, and grains.  Do not take Tylenol if you are taking prescription pain medications.   Follow up  Our office will schedule a follow-up appointment with a CT scan 3-4 weeks after your surgery.  Please call us immediately for any of the following conditions  Severe or worsening pain in your legs or feet or in your abdomen back or chest. Increased pain, redness, drainage (pus) from your incision site. Increased abdominal pain, bloating, nausea, vomiting or persistent diarrhea. Fever of 101 degrees or higher. Swelling in your leg (s),  Reduce your risk of vascular disease  Stop smoking. If you would like help call [facility] at 1-800-QUIT-NOW (1-800-784-8669) or [place] at [phone]. Manage your cholesterol Maintain a desired weight Control your diabetes Keep your blood pressure down  If you have questions, please call the office at [phone].  

## 2022-04-04 NOTE — Interval H&P Note (Signed)
History and Physical Interval Note:  04/04/2022 8:31 AM  Theodore Mendoza  has presented today for surgery, with the diagnosis of Infrarenal abdominal aortic aneurysm without rupture.  The various methods of treatment have been discussed with the patient and family. After consideration of risks, benefits and other options for treatment, the patient has consented to  Procedure(s): ABDOMINAL AORTIC ENDOVASCULAR STENT GRAFT (N/A) as a surgical intervention.  The patient's history has been reviewed, patient examined, no change in status, stable for surgery.  I have reviewed the patient's chart and labs.  Questions were answered to the patient's satisfaction.     Annamarie Major

## 2022-04-04 NOTE — Anesthesia Procedure Notes (Signed)
Procedure Name: Intubation Date/Time: 04/04/2022 8:51 AM  Performed by: Lance Coon, CRNAPre-anesthesia Checklist: Patient identified, Emergency Drugs available, Suction available, Patient being monitored and Timeout performed Patient Re-evaluated:Patient Re-evaluated prior to induction Oxygen Delivery Method: Circle system utilized Preoxygenation: Pre-oxygenation with 100% oxygen Induction Type: IV induction Ventilation: Mask ventilation without difficulty Laryngoscope Size: Miller and 3 Grade View: Grade I Tube type: Oral Tube size: 7.5 mm Number of attempts: 1 Airway Equipment and Method: Stylet Placement Confirmation: ETT inserted through vocal cords under direct vision, positive ETCO2 and breath sounds checked- equal and bilateral Secured at: 22 cm Tube secured with: Tape Dental Injury: Teeth and Oropharynx as per pre-operative assessment

## 2022-04-04 NOTE — Op Note (Signed)
Patient name: Theodore Mendoza MRN: [id number] DOB: [birth year] Sex: male  04/04/2022 Pre-operative Diagnosis: AAA Post-operative diagnosis:  Same Surgeon:  Annamarie Major Assistants:  Ivin Booty, PA Procedure:   1: Endovascular repair of abdominal aortic aneurysm   #2: Bilateral ultrasound-guided common femoral artery access   #3: Catheter in aorta x 2   #4: Abdominal aortogram   #5: Angioplasty, left external iliac artery   #6: Radiology S and I codes Anesthesia:  General Blood Loss:  minimal Specimens:  none  Findings: I could not get the sheath up the left external iliac artery and so I pretreated this with balloon angioplasty using an 8 mm balloon.  Devices used: Main body was primary left, Gore 26 x 12 x 18.  Contralateral right was a Gore 14 x 14  Indications: This is an 82 year old gentleman with a 5.2 cm infrarenal aneurysm associated with an aortic ulcer which is very irregular in appearance.  We discussed proceeding with treatment to prevent rupture.  Procedure:  The patient was identified in the holding area and taken to [place] [facility]  The patient was then placed supine on the table. general anesthesia was administered.  The patient was prepped and draped in the usual sterile fashion.  A time out was called and antibiotics were administered.  A PA was necessary to expedite the procedure and assist with technical details.  She helped with access, wire exchanges, and groin closure.  Ultrasound was used to evaluate bilateral common femoral arteries which were moderately diseased.  A #11 leg was used to make a skin nick.  Bilateral common femoral arteries were then cannulated under ultrasound guidance with a micropuncture needle.  A 018 wire was advanced without resistance followed placement of a micropuncture sheath.  A Bentson wire was then inserted.  The subcutaneous tract was dilated with an 8 [place] dilator.  Pro-glide devices were placed at the 11:00 and 1 o'clock position for free  closure.  8 French sheaths were placed bilaterally.  The patient was fully heparinized.  Wire exchanges were then performed and Amplatz wires were placed bilaterally into the descending thoracic aorta.  A 12 French sheath was advanced up the right side.  I tried to advance a 57 French sheath up the left but was unsuccessful.  A angiogram was performed which showed diffuse disease throughout the external iliac artery on the left.  I therefore performed balloon angioplasty using an 8 x 60 balloon.  This was done for 90 seconds.  Completion imaging showed improved flow through the artery with no dissection.  I was then able to put the dilator back into the 16 French sheath and placed it into the abdominal aorta without resistance.  A pigtail catheter was placed up the right side and abdominal aortogram was performed locating the renal arteries.  The main body device was then prepared on the back table and inserted up the left side.  This was a Gore 26 x 12 x 18 device.  It was deployed landing at the level of the renal arteries, down to the contralateral gate.  A second access in the 12 French dry seal sheath was obtained.  A Berenstein 2 catheter and a Bentson wire were used to cannulate the contralateral gate.  The Berenstein catheter was removed and the Omni Flush catheter was advanced into the main body which was able to be freely rotated, confirming successful cannulation.  A Amplatz Super Stiff wire was then placed.  The Office manager  was rotated to a left anterior oblique position and a retrograde injection from the sheath was performed on the right locating the right hypogastric artery.  The ipsilateral limb was then prepared on the back table.  This was a Gore 14 x 14 device.  It was advanced to see with appropriate overlap and deployed landing just proximal to the right hypogastric artery.  Next the image detector was rotated to a right anterior oblique position and a retrograde injection was performed to  the sheath locating the left hypogastric artery.  The remaining ipsilateral limb was then deployed and landed just proximal to the hypogastric artery.  Next, using a MOB balloon, the proximal and distal attachment sites were molded as well as device overlap.  A completion angiogram was then performed.  This showed successful exclusion of the aneurysm with preservation of patency of bilateral renal arteries as well as bilateral external and hypogastric arteries.  I then performed a final retrograde injection through the sheath in the left groin which showed that the external iliac artery remained widely patent without dissection.  Bentson wires were then inserted.  The sheaths were removed and the Pro-glide devices were used to close the arteriotomy sites.  The patient had palpable femoral pulses as well as pedal Doppler signals which were similar to preop.  The heparin was reversed with 50 mg of protamine.  Manual pressure was held while the protamine was administered.  The groins were hemostatic.  Cautery was used on the subcutaneous tissue and Dermabond was placed.  The patient tolerated the procedure well.  He was successfully extubated and taken recovery in stable condition.  There were no immediate complications.   Disposition: To PACU stable.   Theotis Burrow, M.D., [facility] [place] Office: [phone] Pager:  [phone]

## 2022-04-04 NOTE — Progress Notes (Signed)
  Progress Note    04/04/2022 3:37 PM Day of Surgery  Subjective:  no complaints. Denies any abdominal pain or back pain. No pain in legs   Vitals:   04/04/22 1330 04/04/22 1423  BP: (!) 114/57 (!) 109/51  Pulse: 74 69  Resp: 13 16  Temp: 97.6 F (36.4 C) (!) 97.5 F (36.4 C)  SpO2: 99% 99%   Physical Exam: Cardiac:  regular Lungs:  non labored Extremities:  Bilateral groin access sites are c/d/I without swelling or hematoma Abdomen:  distended but soft, non tender Neurologic: alert and oriented  CBC    Component Value Date/Time   WBC 6.3 04/04/2022 1110   RBC 3.55 (L) 04/04/2022 1110   HGB 10.4 (L) 04/04/2022 1110   HGB 11.9 (L) 09/29/2021 1026   HCT 33.4 (L) 04/04/2022 1110   HCT 36.9 (L) 09/29/2021 1026   PLT 105 (L) 04/04/2022 1110   PLT 173 09/29/2021 1026   MCV 94.1 04/04/2022 1110   MCV 89 09/29/2021 1026   MCH 29.3 04/04/2022 1110   MCHC 31.1 04/04/2022 1110   RDW 13.2 04/04/2022 1110   RDW 12.5 09/29/2021 1026   LYMPHSABS 1.2 09/29/2021 1026   MONOABS 0.3 10/21/2020 1520   EOSABS 0.3 09/29/2021 1026   BASOSABS 0.1 09/29/2021 1026    BMET    Component Value Date/Time   NA 138 04/04/2022 1110   NA 144 09/29/2021 1026   K 3.6 04/04/2022 1110   CL 103 04/04/2022 1110   CO2 28 04/04/2022 1110   GLUCOSE 132 (H) 04/04/2022 1110   BUN 17 04/04/2022 1110   BUN 11 09/29/2021 1026   CREATININE 0.85 04/04/2022 1110   CALCIUM 8.4 (L) 04/04/2022 1110   GFRNONAA >60 04/04/2022 1110   GFRAA 97 05/06/2020 1057    INR    Component Value Date/Time   INR 1.3 (H) 04/04/2022 1110     Intake/Output Summary (Last 24 hours) at 04/04/2022 1537 Last data filed at 04/04/2022 1330 Gross per 24 hour  Intake 1550 ml  Output 425 ml  Net 1125 ml     Assessment/Plan:  82 y.o. male is s/p EVAR Day of Surgery   Doing well post op Not having any pain Bilateral femoral access sites without swelling or hematoma Lower extremities well perfused with doppler left DP,  right PT Anticipate d/c tomorrow if he continues to do well overnight   Karoline Caldwell, PA-C [facility] [phone] 04/04/2022 3:37 PM

## 2022-04-04 NOTE — Transfer of Care (Signed)
Immediate Anesthesia Transfer of Care Note  Patient: Theodore Mendoza  Procedure(s) Performed: ABDOMINAL AORTIC ENDOVASCULAR STENT GRAFT (Bilateral: Groin)  Patient Location: PACU  Anesthesia Type:General  Level of Consciousness: drowsy and patient cooperative  Airway & Oxygen Therapy: Patient Spontanous Breathing and Patient connected to face mask oxygen  Post-op Assessment: Report given to RN and Post -op Vital signs reviewed and stable  Post vital signs: Reviewed and stable  Last Vitals:  Vitals Value Taken Time  BP 121/58 04/04/22 1105  Temp    Pulse 83 04/04/22 1107  Resp 16 04/04/22 1107  SpO2 99 % 04/04/22 1107  Vitals shown include unvalidated device data.  Last Pain:  Vitals:   04/04/22 0706  TempSrc:   PainSc: 0-No pain         Complications: No notable events documented.

## 2022-04-04 NOTE — Anesthesia Postprocedure Evaluation (Signed)
Anesthesia Post Note  Patient: Theodore Mendoza  Procedure(s) Performed: ABDOMINAL AORTIC ENDOVASCULAR STENT GRAFT (Bilateral: Groin)     Patient location during evaluation: PACU Anesthesia Type: General Level of consciousness: awake and alert Pain management: pain level controlled Vital Signs Assessment: post-procedure vital signs reviewed and stable Respiratory status: spontaneous breathing, nonlabored ventilation and respiratory function stable Cardiovascular status: stable and blood pressure returned to baseline Anesthetic complications: no   No notable events documented.  Last Vitals:  Vitals:   04/04/22 1315 04/04/22 1330  BP: (!) 105/58 (!) 114/57  Pulse: 74 74  Resp: 15 13  Temp:  36.4 C  SpO2: 99% 99%    Last Pain:  Vitals:   04/04/22 1330  TempSrc:   PainSc: 0-No pain                 Audry Pili

## 2022-04-05 ENCOUNTER — Encounter (HOSPITAL_COMMUNITY): Payer: Self-pay | Admitting: Surgery

## 2022-04-05 LAB — BASIC METABOLIC PANEL
Anion gap: 7 (ref 5–15)
BUN: 18 mg/dL (ref 8–23)
CO2: 29 mmol/L (ref 22–32)
Calcium: 8.4 mg/dL — ABNORMAL LOW (ref 8.9–10.3)
Chloride: 105 mmol/L (ref 98–111)
Creatinine, Ser: 0.87 mg/dL (ref 0.61–1.24)
GFR, Estimated: >60 mL/min (ref >60)
Glucose, Bld: 135 mg/dL — ABNORMAL HIGH (ref 70–99)
Potassium: 5.2 mmol/L — ABNORMAL HIGH (ref 3.5–5.1)
Sodium: 141 mmol/L (ref 135–145)

## 2022-04-05 LAB — CBC
HCT: 31.9 % — ABNORMAL LOW (ref 39.0–52.0)
Hemoglobin: 10.2 g/dL — ABNORMAL LOW (ref 13.0–17.0)
MCH: 29.7 pg (ref 26.0–34.0)
MCHC: 32 g/dL (ref 30.0–36.0)
MCV: 93 fL (ref 80.0–100.0)
Platelets: 121 10*3/uL — ABNORMAL LOW (ref 150–400)
RBC: 3.43 MIL/uL — ABNORMAL LOW (ref 4.22–5.81)
RDW: 13.1 % (ref 11.5–15.5)
WBC: 6.3 10*3/uL (ref 4.0–10.5)
nRBC: 0 % (ref 0.0–0.2)

## 2022-04-05 NOTE — Progress Notes (Addendum)
  Progress Note    04/05/2022 7:10 AM 1 Day Post-Op  Subjective:  no complaints. Denies any pain in back or abdomen. Tolerated diet. Voiding without issues   Vitals:   04/04/22 1945 04/05/22 0408  BP: 101/60 (!) 120/54  Pulse: 67 61  Resp: 19 12  Temp: 97.7 F (36.5 C) 97.6 F (36.4 C)  SpO2: 98% 99%   Physical Exam: Cardiac:  regular Lungs:  non labored Extremities:  B femoral access sites c/d/I without swelling or hematoma. Doppler left Dp and right PT signals Abdomen:  soft, non tender Neurologic: alert and oriented  CBC    Component Value Date/Time   WBC 6.3 04/05/2022 0109   RBC 3.43 (L) 04/05/2022 0109   HGB 10.2 (L) 04/05/2022 0109   HGB 11.9 (L) 09/29/2021 1026   HCT 31.9 (L) 04/05/2022 0109   HCT 36.9 (L) 09/29/2021 1026   PLT 121 (L) 04/05/2022 0109   PLT 173 09/29/2021 1026   MCV 93.0 04/05/2022 0109   MCV 89 09/29/2021 1026   MCH 29.7 04/05/2022 0109   MCHC 32.0 04/05/2022 0109   RDW 13.1 04/05/2022 0109   RDW 12.5 09/29/2021 1026   LYMPHSABS 1.2 09/29/2021 1026   MONOABS 0.3 10/21/2020 1520   EOSABS 0.3 09/29/2021 1026   BASOSABS 0.1 09/29/2021 1026    BMET    Component Value Date/Time   NA 141 04/05/2022 0109   NA 144 09/29/2021 1026   K 5.2 (H) 04/05/2022 0109   CL 105 04/05/2022 0109   CO2 29 04/05/2022 0109   GLUCOSE 135 (H) 04/05/2022 0109   BUN 18 04/05/2022 0109   BUN 11 09/29/2021 1026   CREATININE 0.87 04/05/2022 0109   CALCIUM 8.4 (L) 04/05/2022 0109   GFRNONAA >60 04/05/2022 0109   GFRAA 97 05/06/2020 1057    INR    Component Value Date/Time   INR 1.3 (H) 04/04/2022 1110     Intake/Output Summary (Last 24 hours) at 04/05/2022 0710 Last data filed at 04/05/2022 0410 Gross per 24 hour  Intake 1899.54 ml  Output 975 ml  Net 924.54 ml     Assessment/Plan:  82 y.o. male is s/p # 1: Endovascular repair of abdominal aortic aneurysm #2: Bilateral ultrasound-guided common femoral artery access #3: Catheter in aorta x 2 #4:  Abdominal aortogram #5: Angioplasty, left external iliac artery Post Op Day 1.  Doing well post operatively Without any pain  B femoral access sites c/d/I without swelling or hematoma Lower extremities remain well perfused with Doppler DP on left PT on right Stable for discharge home today Will have follow up in 1 month with CTA abdomen pelvis   Karoline Caldwell, PA-C [facility] [phone] 04/05/2022 7:10 AM  I agree with the above.  I have seen and evaluated the patient.  He is postoperative 1 from endovascular aneurysm repair.  His groin sites are soft.  Extremities are warm and well-perfused.  He is voiding.  Anticipate discharge home today.  Annamarie Major

## 2022-04-05 NOTE — Plan of Care (Signed)
  Problem: Education: Goal: Knowledge of discharge needs will improve Outcome: Progressing   Problem: Clinical Measurements: Goal: Postoperative complications will be avoided or minimized Outcome: Progressing   Problem: Skin Integrity: Goal: Demonstration of wound healing without infection will improve Outcome: Progressing   Problem: Education: Goal: Knowledge of General Education information will improve Description: Including pain rating scale, medication(s)/side effects and non-pharmacologic comfort measures Outcome: Progressing   Problem: Health Behavior/Discharge Planning: Goal: Ability to manage health-related needs will improve Outcome: Progressing   Problem: Activity: Goal: Risk for activity intolerance will decrease Outcome: Progressing

## 2022-04-06 ENCOUNTER — Telehealth: Payer: Self-pay

## 2022-04-06 NOTE — Patient Outreach (Signed)
  Care Coordination [facility] Note Transition Care Management Unsuccessful Follow-up Telephone Call  Date of discharge and from where:  [facility]  Attempts:  1st Attempt  Reason for unsuccessful TCM follow-up call:  No answer/busy  Johnney Killian, RN, BSN, CCM Care Management Coordinator [facility] Phone: [phone]: [phone]

## 2022-04-09 ENCOUNTER — Telehealth: Payer: Self-pay

## 2022-04-09 NOTE — Patient Outreach (Signed)
  Care Coordination [facility] Note Transition Care Management Unsuccessful Follow-up Telephone Call  Date of discharge and from where:  [facility]/22  Attempts:  2nd Attempt  Reason for unsuccessful TCM follow-up call:  No answer/busy  Johnney Killian, RN, BSN, CCM Care Management Coordinator [facility] Phone: [phone]: [phone]

## 2022-04-10 ENCOUNTER — Telehealth: Payer: Self-pay | Admitting: *Deleted

## 2022-04-10 NOTE — Patient Outreach (Signed)
  Care Coordination TOC Note Transition Care Management Unsuccessful Follow-up Telephone Call  Date of discharge and from where:  [facility] on 04/05/22  Attempts:  3rd Attempt  Reason for unsuccessful TCM follow-up call:  No answer/busy on primary phone and unable to leave message on mobile phone.  Follow-up Appointments Scheduled: Dr Livia Snellen on 04/16/22 at 10:10 Dr Trula Slade (vascular surgeon) on 05/14/22 at 9:20  Chong Sicilian, BSN, RN-BC [place] Direct Dial: [phone] Main #: [phone]

## 2022-04-10 NOTE — Discharge Summary (Signed)
EVAR Discharge Summary   Theodore Mendoza 1940/06/28 82 y.o. male  MRN: [id number]  Admission Date: 04/04/2022  Discharge Date: 04/05/2022  Physician: No att. providers found  Admission Diagnosis: Status post abdominal aortic aneurysm (AAA) repair [E42.353, Z86.79] Abdominal aortic aneurysm (AAA) greater than 5.5 cm in diameter in male [facility]) [I71.40]  Hospital Course:  The patient was admitted to the hospital and taken to the operating room on 04/04/2022 and underwent:Endovascular repair of abdominal aortic aneurysm, abdominal aortogram, angioplasty left external iliac artery by Dr. Trula Slade. The pt tolerated the procedure well and was transported to the PACU in good condition.   By POD#1, bilateral femoral access sites remained clean,dry and intact without swelling or hematoma. Lower extremities well perfused and warm with left DP and right PT signals. No abdominal pain or back pain. Voiding without difficulty. Tolerating diet. He does not ambulate at baseline. No change. He remained stable for discharge home. He will have follow up arranged in 1 month with CTA and to see Dr. Trula Slade   CBC    Component Value Date/Time   WBC 6.3 04/05/2022 0109   RBC 3.43 (L) 04/05/2022 0109   HGB 10.2 (L) 04/05/2022 0109   HGB 11.9 (L) 09/29/2021 1026   HCT 31.9 (L) 04/05/2022 0109   HCT 36.9 (L) 09/29/2021 1026   PLT 121 (L) 04/05/2022 0109   PLT 173 09/29/2021 1026   MCV 93.0 04/05/2022 0109   MCV 89 09/29/2021 1026   MCH 29.7 04/05/2022 0109   MCHC 32.0 04/05/2022 0109   RDW 13.1 04/05/2022 0109   RDW 12.5 09/29/2021 1026   LYMPHSABS 1.2 09/29/2021 1026   MONOABS 0.3 10/21/2020 1520   EOSABS 0.3 09/29/2021 1026   BASOSABS 0.1 09/29/2021 1026    BMET    Component Value Date/Time   NA 141 04/05/2022 0109   NA 144 09/29/2021 1026   K 5.2 (H) 04/05/2022 0109   CL 105 04/05/2022 0109   CO2 29 04/05/2022 0109   GLUCOSE 135 (H) 04/05/2022 0109   BUN 18 04/05/2022 0109   BUN 11  09/29/2021 1026   CREATININE 0.87 04/05/2022 0109   CALCIUM 8.4 (L) 04/05/2022 0109   GFRNONAA >60 04/05/2022 0109   GFRAA 97 05/06/2020 1057       Discharge Instructions     ABDOMINAL PROCEDURE/ANEURYSM REPAIR/AORTO-BIFEMORAL BYPASS:  Call MD for increased abdominal pain; cramping diarrhea; nausea/vomiting   Complete by: As directed    Activity as tolerated - No restrictions   Complete by: As directed    Call MD for:  redness, tenderness, or signs of infection (pain, swelling, bleeding, redness, odor or green/yellow discharge around incision site)   Complete by: As directed    Call MD for:  severe or increased pain, loss or decreased feeling  in affected limb(s)   Complete by: As directed    Call MD for:  temperature >100.5   Complete by: As directed    Discharge wound care:   Complete by: As directed    You may shower as normal. Do not soak in bathtub   Resume previous diet   Complete by: As directed        Discharge Diagnosis:  Status post abdominal aortic aneurysm (AAA) repair [I14.431, Z86.79] Abdominal aortic aneurysm (AAA) greater than 5.5 cm in diameter in male [facility]) [I71.40]  Secondary Diagnosis: Patient Active Problem List   Diagnosis Date Noted   Status post abdominal aortic aneurysm (AAA) repair 04/04/2022   Abdominal aortic aneurysm (AAA) greater  than 5.5 cm in diameter in male [facility]) 04/04/2022   Iron deficiency anemia 03/08/2022   Gastroesophageal reflux disease 10/02/2021   Patient cannot afford medications 10/02/2021   Severe episode of recurrent major depressive disorder, without psychotic features ([place]) 11/01/2020   Generalized anxiety disorder 11/01/2020   Guillain Barr syndrome ([place]) 08/12/2020   Long term current use of anticoagulant 01/20/2020   Abdominal aortic aneurysm (AAA) without rupture ([place])    Persistent atrial fibrillation ([place]) 04/21/2019   Paraplegia, incomplete ([place]) 10/16/2018   Idiopathic peripheral neuropathy 06/26/2017    History of TIA (transient ischemic attack) 10/01/2015   Hyperlipidemia 05/08/2014   Essential (primary) hypertension 05/07/2014   Meningioma ([place]) 12/13/2011   History of non-ST elevation myocardial infarction (NSTEMI) 01/17/2011   Past Medical History:  Diagnosis Date   AAA (abdominal aortic aneurysm) ([place])    Atrial fibrillation ([place])    Brain tumor ([place])    x2 (benign per pt)   Cancer ([place])    melanoma removed twice (head and right forearm)   Cardiac arrhythmia    CHF (congestive heart failure) ([place])    GERD (gastroesophageal reflux disease)    Guillain-Barre syndrome ([place]) 02/13/1986   Headache    Hypertension    Kidney stones    Myocardial infarction [facility]) 2007     Allergies as of 04/05/2022       Reactions   Hydrocodone-acetaminophen    Passed out and got real weak   Morphine And Related Other (See Comments)   Reaction:  Confusion/weakness/hallucinations   Other Other (See Comments)   Reaction:  Confusion/weakness/hallucinations   Valsartan Rash        Medication List     TAKE these medications    albuterol 108 (90 Base) MCG/ACT inhaler Commonly known as: VENTOLIN HFA INHALE 2 PUFFS INTO THE LUNGS EVERY 6 HOURS AS NEEDED FOR WHEEZING OR SHORTNESS OF BREATH   amLODipine 5 MG tablet Commonly known as: NORVASC Take 1 tablet (5 mg total) by mouth daily.   apixaban 5 MG Tabs tablet Commonly known as: Eliquis Take 1 tablet (5 mg total) by mouth 2 (two) times daily.   cyanocobalamin 1000 MCG tablet Commonly known as: VITAMIN B12 Take 1 tablet (1,000 mcg total) by mouth daily.   dofetilide 500 MCG capsule Commonly known as: TIKOSYN Take 1 capsule by mouth twice daily   escitalopram 20 MG tablet Commonly known as: LEXAPRO Take 1 tablet (20 mg total) by mouth daily.   FeroSul 325 (65 FE) MG tablet Generic drug: ferrous sulfate TAKE 1 TABLET DAILY   furosemide 20 MG tablet Commonly known as: LASIX Take 1 tablet (20 mg total) by mouth daily.    gabapentin 300 MG capsule Commonly known as: NEURONTIN Take 300 mg by mouth three times daily   lisinopril 5 MG tablet Commonly known as: ZESTRIL Take 1 tablet (5 mg total) by mouth daily.   mirtazapine 7.5 MG tablet Commonly known as: REMERON Take 1 tablet (7.5 mg total) by mouth at bedtime.   pantoprazole 40 MG tablet Commonly known as: PROTONIX Take 1 tablet (40 mg total) by mouth 2 (two) times daily.   pravastatin 40 MG tablet Commonly known as: PRAVACHOL Take 1 tablet (40 mg total) by mouth daily.   sucralfate 1 g tablet Commonly known as: CARAFATE Take 1 tablet (1 g total) by mouth 2 (two) times daily.   tamsulosin 0.4 MG Caps capsule Commonly known as: FLOMAX Take 1 capsule (0.4 mg total) by mouth daily.  Discharge Care Instructions  (From admission, onward)           Start     Ordered   04/05/22 0000  Discharge wound care:       Comments: You may shower as normal. Do not soak in bathtub   04/05/22 0836            Discharge Instructions:   [facility]  Discharge Instructions Endovascular Aortic Aneurysm Repair  Please refer to the following instructions for your post-procedure care. Your surgeon or Physician Assistant will discuss any changes with you.  Activity  You are encouraged to walk as much as you can. You can slowly return to normal activities but must avoid strenuous activity and heavy lifting until your doctor tells you it's OK. Avoid activities such as vacuuming or swinging a gold club. It is normal to feel tired for several weeks after your surgery. Do not drive until your doctor gives the OK and you are no longer taking prescription pain medications. It is also normal to have difficulty with sleep habits, eating, and bowel movements after surgery. These will go away with time.  Bathing/Showering  You may shower after you go home. If you have an incision, do not soak in a bathtub, hot  tub, or swim until the incision heals completely.  Incision Care  Shower every day. Clean your incision with mild soap and water. Pat the area dry with a clean towel. You do not need a bandage unless otherwise instructed. Do not apply any ointments or creams to your incision. If you clothing is irritating, you may cover your incision with a dry gauze pad.  Diet  Resume your normal diet. There are no special food restrictions following this procedure. A low fat/low cholesterol diet is recommended for all patients with vascular disease. In order to heal from your surgery, it is CRITICAL to get adequate nutrition. Your body requires vitamins, minerals, and protein. Vegetables are the best source of vitamins and minerals. Vegetables also provide the perfect balance of protein. Processed food has little nutritional value, so try to avoid this.  Medications  Resume taking all of your medications unless your doctor or Physician Assistnat tells you not to. If your incision is causing pain, you may take over-the-counter pain relievers such as acetaminophen (Tylenol). If you were prescribed a stronger pain medication, please be aware these medications can cause nausea and constipation. Prevent nausea by taking the medication with a snack or meal. Avoid constipation by drinking plenty of fluids and eating foods with a high amount of fiber, such as fruits, vegetables, and grains. Do not take Tylenol if you are taking prescription pain medications.   Follow up  [place] office will schedule a follow-up appointment with a C.T. scan 3-4 weeks after your surgery.  Please call us immediately for any of the following conditions  Severe or worsening pain in your legs or feet or in your abdomen back or chest. Increased pain, redness, drainage (pus) from your incision sit. Increased abdominal pain, bloating, nausea, vomiting or persistent diarrhea. Fever of 101 degrees or higher. Swelling in your leg (s),  Reduce  your risk of vascular disease  Stop smoking. If you would like help call [facility] at 1-800-QUIT-NOW [phone]) or [place] at [phone]. Manage your cholesterol Maintain a desired weight Control your diabetes Keep your blood pressure down  If you have questions, please call the office at [phone].   Prescriptions given: None  Disposition: Home  Patient's condition: is Good  Follow up: 1. Dr. Trula Slade in 4 weeks with CTA protocol   Karoline Caldwell, PA-C [facility] [phone] 04/10/2022  12:30 PM   - For VQI Registry use - Post-op:  Time to Extubation: '[X]'$  In OR, '[ ]'$  < 12 hrs, '[ ]'$  12-24 hrs, '[ ]'$  >=24 hrs Vasopressors Req. Post-op: No MI: No., '[ ]'$  Troponin only, '[ ]'$  EKG or Clinical New Arrhythmia: No CHF: No ICU Stay: 0 day in ICU Transfusion: No     If yes, 0 units given  Complications: Resp failure: No., '[ ]'$  Pneumonia, '[ ]'$  Ventilator Chg in renal function: No., '[ ]'$  Inc. Cr > 0.5, '[ ]'$  Temp. Dialysis,  '[ ]'$  Permanent dialysis Leg ischemia: No., no Surgery needed, '[ ]'$  Yes, Surgery needed,  '[ ]'$  Amputation Bowel ischemia: No., '[ ]'$  Medical Rx, '[ ]'$  Surgical Rx Wound complication: No., '[ ]'$  Superficial separation/infection, '[ ]'$  Return to OR Return to OR: No  Return to OR for bleeding: No Stroke: No., '[ ]'$  Minor, '[ ]'$  Major  Discharge medications: Statin use:  Yes  ASA use:  No  Plavix use:  No  Beta blocker use:  No  ARB use:  No ACEI use:  Yes CCB use:  Yes

## 2022-04-16 ENCOUNTER — Ambulatory Visit (INDEPENDENT_AMBULATORY_CARE_PROVIDER_SITE_OTHER): Payer: Medicare Other | Admitting: Family Medicine

## 2022-04-16 ENCOUNTER — Encounter: Payer: Self-pay | Admitting: Family Medicine

## 2022-04-16 VITALS — BP 116/57 | HR 55 | Temp 97.6°F | Ht 68.0 in | Wt 150.0 lb

## 2022-04-16 DIAGNOSIS — G610 Guillain-Barre syndrome: Secondary | ICD-10-CM

## 2022-04-16 DIAGNOSIS — G8222 Paraplegia, incomplete: Secondary | ICD-10-CM

## 2022-04-16 DIAGNOSIS — I4819 Other persistent atrial fibrillation: Secondary | ICD-10-CM

## 2022-04-16 DIAGNOSIS — I714 Abdominal aortic aneurysm, without rupture, unspecified: Secondary | ICD-10-CM | POA: Diagnosis not present

## 2022-04-16 DIAGNOSIS — E782 Mixed hyperlipidemia: Secondary | ICD-10-CM

## 2022-04-16 DIAGNOSIS — I10 Essential (primary) hypertension: Secondary | ICD-10-CM

## 2022-04-16 NOTE — Progress Notes (Addendum)
Subjective:  Patient ID: Theodore Mendoza, male    DOB: 1940-04-07  Age: 82 y.o. MRN: [id number]  CC: Medical Management of Chronic Issues   HPI Theodore Mendoza presents for follow up of paraplegia with onset after Guillain Barre syndrome 40 years ago.He uses leg braces to assist with keeping them straight and helping him to make transfers. HE has been using races for many years. The current set are worn out.    He recently had a repair of a his aortic aneurysm through a catheter.    presents for  follow-up of hypertension. Patient has no history of headache chest pain or shortness of breath or recent cough. Patient also denies symptoms of TIA such as focal numbness or weakness. Patient denies side effects from medication. States taking it regularly.  Atrial fibrillation follow up. Pt. is treated with rate control and anticoagulation. Pt.  denies palpitations, rapid rate, chest pain, dyspnea and edema. There has been no bleeding from nose or gums. Pt. has not noticed blood with urine or stool.  Although there is routine bruising easily, it is not excessive.   in for follow-up of elevated cholesterol. Doing well without complaints on current medication. Denies side effects of statin including myalgia and arthralgia and nausea. Currently no chest pain, shortness of breath or other cardiovascular related symptoms noted.       04/16/2022   10:03 AM 04/16/2022    9:52 AM 01/11/2022   10:06 AM  Depression screen PHQ 2/9  Decreased Interest 1 0 1  Down, Depressed, Hopeless 1 0 1  PHQ - 2 Score 2 0 2  Altered sleeping 0  3  Tired, decreased energy 0  3  Change in appetite 0  2  Feeling bad or failure about yourself  2  1  Trouble concentrating 0  0  Moving slowly or fidgety/restless 3  0  Suicidal thoughts 0  1  PHQ-9 Score 7  12  Difficult doing work/chores Somewhat difficult  Somewhat difficult    History Theodore Mendoza has a past medical history of AAA (abdominal aortic aneurysm) ([place]), Atrial  fibrillation ([place]), Brain tumor ([place]), Cancer ([place]), Cardiac arrhythmia, CHF (congestive heart failure) ([place]), GERD (gastroesophageal reflux disease), Guillain-Barre syndrome ([place]) (02/13/1986), Headache, Hypertension, Kidney stones, and Myocardial infarction ([place]) (2007).   He has a past surgical history that includes Cystoscopy with stent placement (Left, 05/08/2014); Coronary angioplasty (1997); Eye surgery (Right, may 2015); gamma kniferadiation treatment (Feb 09 2014); Lithotripsy (years ago); Cystoscopy with retrograde pyelogram, ureteroscopy and stent placement (Left, 06/28/2014); Holmium laser application (Left, 2/75/1700); Stone extraction with basket (Left, 06/28/2014); Esophagogastroduodenoscopy (egd) with propofol (N/A, 12/16/2019); Hemostasis clip placement (12/16/2019); Hemostasis control (12/16/2019); polypectomy (12/16/2019); Esophagogastroduodenoscopy (egd) with propofol (N/A, 08/13/2020); Colonoscopy with propofol (N/A, 08/14/2020); Esophagogastroduodenoscopy (egd) with propofol (N/A, 09/16/2020); enteroscopy (N/A, 09/16/2020); Givens capsule study (N/A, 10/11/2020); Esophagogastroduodenoscopy (egd) with propofol (N/A, 10/25/2020); enteroscopy (N/A, 10/25/2020); and Abdominal aortic endovascular stent graft (Bilateral, 04/04/2022).   His family history includes CAD in his father; Diabetes in his brother, mother, and sister; Lung cancer in his mother; Other in his maternal grandfather; Stroke in his sister.He reports that he has never smoked. He has never been exposed to tobacco smoke. He has never used smokeless tobacco. He reports that he does not drink alcohol and does not use drugs.    ROS Review of Systems  Constitutional:  Negative for fever.  Respiratory:  Negative for shortness of breath.   Cardiovascular:  Negative for chest pain.  Musculoskeletal:  Positive for gait problem. Negative for arthralgias.  Skin:  Negative for rash.    Objective:  BP (!) 116/57   Pulse (!) 55   Temp 97.6 F  (36.4 C)   Ht '5\' 8"'$  (1.727 m)   Wt 150 lb (68 kg) Comment: patient reported  SpO2 98%   BMI 22.81 kg/m   BP Readings from Last 3 Encounters:  04/16/22 (!) 116/57  04/05/22 (!) 116/53  03/23/22 (!) 125/55    Wt Readings from Last 3 Encounters:  04/16/22 150 lb (68 kg)  04/04/22 161 lb 2.5 oz (73.1 kg)  03/23/22 150 lb (68 kg)     Physical Exam Vitals reviewed.  Constitutional:      Appearance: He is well-developed.  HENT:     Head: Normocephalic and atraumatic.     Right Ear: External ear normal.     Left Ear: External ear normal.     Mouth/Throat:     Pharynx: No oropharyngeal exudate or posterior oropharyngeal erythema.  Eyes:     Pupils: Pupils are equal, round, and reactive to light.  Cardiovascular:     Rate and Rhythm: Normal rate and regular rhythm.     Heart sounds: No murmur heard. Pulmonary:     Effort: No respiratory distress.     Breath sounds: Normal breath sounds.  Musculoskeletal:     Cervical back: Normal range of motion and neck supple.     Comments: Wheelchair bound. Bilateral plegia below waist.    Neurological:     Mental Status: He is alert and oriented to person, place, and time.       Assessment & Plan:   Theodore Mendoza was seen today for medical management of chronic issues.  Diagnoses and all orders for this visit:  Guillain Barr syndrome ([place]) -     For home use only DME Other see comment  Paraplegia, incomplete ([place]) -     For home use only DME Other see comment  Abdominal aortic aneurysm (AAA) greater than 5.5 cm in diameter in male [facility])  Essential (primary) hypertension  Mixed hyperlipidemia  Persistent atrial fibrillation ([place])       I am having Theodore Mendoza maintain his cyanocobalamin, apixaban, albuterol, gabapentin, FeroSul, amLODipine, escitalopram, furosemide, lisinopril, mirtazapine, pantoprazole, pravastatin, sucralfate, tamsulosin, and dofetilide.  Allergies as of 04/16/2022       Reactions    Hydrocodone-acetaminophen    Passed out and got real weak   Morphine And Related Other (See Comments)   Reaction:  Confusion/weakness/hallucinations   Other Other (See Comments)   Reaction:  Confusion/weakness/hallucinations   Valsartan Rash        Medication List        Accurate as of April 16, 2022  9:19 PM. If you have any questions, ask your nurse or doctor.          albuterol 108 (90 Base) MCG/ACT inhaler Commonly known as: VENTOLIN HFA INHALE 2 PUFFS INTO THE LUNGS EVERY 6 HOURS AS NEEDED FOR WHEEZING OR SHORTNESS OF BREATH   amLODipine 5 MG tablet Commonly known as: NORVASC Take 1 tablet (5 mg total) by mouth daily.   apixaban 5 MG Tabs tablet Commonly known as: Eliquis Take 1 tablet (5 mg total) by mouth 2 (two) times daily.   cyanocobalamin 1000 MCG tablet Commonly known as: VITAMIN B12 Take 1 tablet (1,000 mcg total) by mouth daily.   dofetilide 500 MCG capsule Commonly known as: TIKOSYN Take 1 capsule by mouth twice daily  escitalopram 20 MG tablet Commonly known as: LEXAPRO Take 1 tablet (20 mg total) by mouth daily.   FeroSul 325 (65 FE) MG tablet Generic drug: ferrous sulfate TAKE 1 TABLET DAILY   furosemide 20 MG tablet Commonly known as: LASIX Take 1 tablet (20 mg total) by mouth daily.   gabapentin 300 MG capsule Commonly known as: NEURONTIN Take 300 mg by mouth three times daily   lisinopril 5 MG tablet Commonly known as: ZESTRIL Take 1 tablet (5 mg total) by mouth daily.   mirtazapine 7.5 MG tablet Commonly known as: REMERON Take 1 tablet (7.5 mg total) by mouth at bedtime.   pantoprazole 40 MG tablet Commonly known as: PROTONIX Take 1 tablet (40 mg total) by mouth 2 (two) times daily.   pravastatin 40 MG tablet Commonly known as: PRAVACHOL Take 1 tablet (40 mg total) by mouth daily.   sucralfate 1 g tablet Commonly known as: CARAFATE Take 1 tablet (1 g total) by mouth 2 (two) times daily.   tamsulosin 0.4 MG Caps  capsule Commonly known as: FLOMAX Take 1 capsule (0.4 mg total) by mouth daily.               Durable Medical Equipment  (From admission, onward)           Start     Ordered   04/16/22 0000  For home use only DME Other see comment       Comments: JH:[id number], G82.22; Guillan Barre, G 61.0  Question:  Length of Need  Answer:  Lifetime   04/16/22 1035             Follow-up: Return in about 6 months (around 10/15/2022) for A fib, hypertension, cholesterol.  Claretta Fraise, M.D.

## 2022-04-30 ENCOUNTER — Other Ambulatory Visit: Payer: Self-pay

## 2022-04-30 DIAGNOSIS — I7143 Infrarenal abdominal aortic aneurysm, without rupture: Secondary | ICD-10-CM

## 2022-04-30 DIAGNOSIS — I714 Abdominal aortic aneurysm, without rupture, unspecified: Secondary | ICD-10-CM

## 2022-05-01 ENCOUNTER — Telehealth: Payer: Self-pay | Admitting: *Deleted

## 2022-05-01 NOTE — Addendum Note (Signed)
Addended by: Claretta Fraise on: 05/01/2022 05:18 PM   Modules accepted: Orders

## 2022-05-01 NOTE — Telephone Encounter (Signed)
Need new specific DME order and updated note with F2F documentation for coverage purposes.

## 2022-05-01 NOTE — Telephone Encounter (Signed)
Note and order havee been updated. Order reprinted. Please fax.

## 2022-05-02 NOTE — Telephone Encounter (Signed)
This has been faxed.

## 2022-05-07 ENCOUNTER — Other Ambulatory Visit: Payer: Self-pay | Admitting: Family Medicine

## 2022-05-10 ENCOUNTER — Ambulatory Visit (HOSPITAL_COMMUNITY)
Admission: RE | Admit: 2022-05-10 | Discharge: 2022-05-10 | Disposition: A | Discharge status: Home / Self Care | Payer: Medicare Other | Source: Ambulatory Visit | Attending: Surgery | Admitting: Surgery

## 2022-05-10 DIAGNOSIS — I714 Abdominal aortic aneurysm, without rupture, unspecified: Secondary | ICD-10-CM | POA: Diagnosis not present

## 2022-05-10 DIAGNOSIS — I7143 Infrarenal abdominal aortic aneurysm, without rupture: Secondary | ICD-10-CM | POA: Insufficient documentation

## 2022-05-10 MED ORDER — IOHEXOL 350 MG/ML SOLN
75.0000 mL | Freq: Once | INTRAVENOUS | Status: AC | PRN
  Administered 2022-05-10: 75 mL via INTRAVENOUS

## 2022-05-14 ENCOUNTER — Ambulatory Visit (INDEPENDENT_AMBULATORY_CARE_PROVIDER_SITE_OTHER): Payer: Medicare Other | Admitting: Surgery

## 2022-05-14 ENCOUNTER — Encounter: Payer: Self-pay | Admitting: Surgery

## 2022-05-14 VITALS — BP 127/66 | HR 54 | Temp 97.7°F | Resp 18 | Ht 68.0 in | Wt 150.0 lb

## 2022-05-14 DIAGNOSIS — I7143 Infrarenal abdominal aortic aneurysm, without rupture: Secondary | ICD-10-CM

## 2022-05-14 NOTE — Progress Notes (Signed)
Patient name: Theodore Mendoza MRN: [id number] DOB: April 08, 1940 Sex: male  REASON FOR VISIT:    Post op  HISTORY OF PRESENT ILLNESS:   Theodore Mendoza is a 82 y.o. male who is status post endovascular repair of a 5.3 cm infrarenal abdominal aortic aneurysm on 04/04/2022.  His postoperative course was uncomplicated and he was discharged home on postoperative day 1.  He has no complaints today.  Patient has a history of coronary artery disease status post PCI and 2008 for a NSTEMI.  He is on anticoagulation for atrial fibrillation.  He has undergone cardioversion.  He also has a history of TIA in 2017.  He was diagnosed with Guillain-Barr syndrome in 1987, and has been wheelchair-bound.  He has a meningioma that is being followed by neurosurgery.    CURRENT MEDICATIONS:    Current Outpatient Medications  Medication Sig Dispense Refill   albuterol (VENTOLIN HFA) 108 (90 Base) MCG/ACT inhaler INHALE 2 PUFFS INTO THE LUNGS EVERY 6 HOURS AS NEEDED FOR WHEEZING OR SHORTNESS OF BREATH 8.5 g 1   amLODipine (NORVASC) 5 MG tablet Take 1 tablet (5 mg total) by mouth daily. 90 tablet 3   apixaban (ELIQUIS) 5 MG TABS tablet Take 1 tablet (5 mg total) by mouth 2 (two) times daily. 180 tablet 3   dofetilide (TIKOSYN) 500 MCG capsule Take 1 capsule by mouth twice daily 60 capsule 2   escitalopram (LEXAPRO) 20 MG tablet Take 1 tablet (20 mg total) by mouth daily. 90 tablet 3   FEROSUL 325 (65 Fe) MG tablet TAKE 1 TABLET DAILY 90 tablet 0   furosemide (LASIX) 20 MG tablet Take 1 tablet (20 mg total) by mouth daily. 90 tablet 3   gabapentin (NEURONTIN) 300 MG capsule Take 300 mg by mouth three times daily 360 capsule 1   lisinopril (ZESTRIL) 5 MG tablet Take 1 tablet (5 mg total) by mouth daily. 90 tablet 3   mirtazapine (REMERON) 7.5 MG tablet Take 1 tablet (7.5 mg total) by mouth at bedtime. 90 tablet 3   pantoprazole (PROTONIX) 40 MG tablet Take 1 tablet (40 mg total) by mouth  2 (two) times daily. 180 tablet 3   pravastatin (PRAVACHOL) 40 MG tablet Take 1 tablet (40 mg total) by mouth daily. 90 tablet 3   sucralfate (CARAFATE) 1 g tablet Take 1 tablet (1 g total) by mouth 2 (two) times daily. 180 tablet 3   tamsulosin (FLOMAX) 0.4 MG CAPS capsule Take 1 capsule (0.4 mg total) by mouth daily. 90 capsule 3   vitamin B-12 (CYANOCOBALAMIN) 1000 MCG tablet Take 1 tablet (1,000 mcg total) by mouth daily. 30 tablet 2   No current facility-administered medications for this visit.    REVIEW OF SYSTEMS:   [X]$  denotes positive finding, [ ]$  denotes negative finding Cardiac  Comments:  Chest pain or chest pressure:    Shortness of breath upon exertion:    Short of breath when lying flat:    Irregular heart rhythm:    Constitutional    Fever or chills:      PHYSICAL EXAM:   Vitals:   05/14/22 0922  BP: 127/66  Pulse: (!) 54  Resp: 18  Temp: 97.7 F (36.5 C)  TempSrc: Temporal  SpO2: 94%  Weight: 150 lb (68 kg)  Height: 5' 8"$  (1.727 m)    GENERAL: The patient is a well-nourished male, in no acute distress. The vital signs are documented above. CARDIOVASCULAR: There is a regular rate and rhythm.  PULMONARY: Non-labored respirations   STUDIES:   CTA:  VASCULAR   1. Interval endovascular aortic repair of bilobed infrarenal abdominal aortic aneurysm. The excluded aneurysm sac measures up to 5.1 cm in diameter. This exam will serve as the new baseline for future follow-up studies. 2. Small type 2 endoleak likely arising via communication between the L4 lumbar arteries and inferior mesenteric artery. Recommend attention on routine follow-up imaging. 3. Extensive atherosclerotic vascular disease with moderate stenosis of the proximal celiac axis, mild stenosis of the proximal SMA and chronic occlusion of the bilateral superficial femoral arteries.   NON-VASCULAR   1. No acute abnormality within the abdomen or pelvis. 2. Ancillary findings as above  without significant interval change compared to recent prior imaging.    MEDICAL ISSUES:   Status post endovascular aneurysm repair of a 5.3 cm infrarenal abdominal aortic aneurysm.  By CT scan, his stent graft is in good position.  There is a small type II endoleak.  This was discussed with the patient and his daughter.  I will have him follow-up in 6 months with a ultrasound.  Leia Alf, MD, FACS [facility] [phone] Pager [phone]

## 2022-05-16 ENCOUNTER — Other Ambulatory Visit: Payer: Self-pay

## 2022-05-16 DIAGNOSIS — I7143 Infrarenal abdominal aortic aneurysm, without rupture: Secondary | ICD-10-CM

## 2022-05-31 ENCOUNTER — Other Ambulatory Visit: Payer: Self-pay

## 2022-05-31 DIAGNOSIS — D509 Iron deficiency anemia, unspecified: Secondary | ICD-10-CM

## 2022-06-08 ENCOUNTER — Ambulatory Visit (INDEPENDENT_AMBULATORY_CARE_PROVIDER_SITE_OTHER): Payer: Medicare Other

## 2022-06-08 VITALS — Ht 68.0 in | Wt 150.0 lb

## 2022-06-08 DIAGNOSIS — Z0000 Encounter for general adult medical examination without abnormal findings: Secondary | ICD-10-CM

## 2022-06-08 NOTE — Progress Notes (Signed)
Subjective:   Theodore Mendoza is a 82 y.o. male who presents for Medicare Annual/Subsequent preventive examination. I connected with  Macdonald L Jicha on 06/08/22 by a audio enabled telemedicine application and verified that I am speaking with the correct person using two identifiers.  Patient Location: Home  Provider Location: Home Office  I discussed the limitations of evaluation and management by telemedicine. The patient expressed understanding and agreed to proceed.  Review of Systems           Objective:    Today's Vitals   06/08/22 1122  Weight: 150 lb (68 kg)  Height: '5\' 8"'$  (1.727 m)   Body mass index is 22.81 kg/m.     06/08/2022   11:25 AM 04/04/2022    7:11 AM 03/23/2022   10:24 AM 05/24/2021    2:10 PM 10/25/2020   12:16 PM 10/21/2020    3:48 PM 09/02/2020    3:51 PM  Advanced Directives  Does Patient Have a Medical Advance Directive? Yes No No Yes No No No  Type of Paramedic of [place];Living will   [place] in Chart? No - copy requested   No - copy requested     Would patient like information on creating a medical advance directive?  No - Patient declined No - Patient declined  No - Patient declined No - Patient declined No - Patient declined    Current Medications (verified) Outpatient Encounter Medications as of 06/08/2022  Medication Sig   albuterol (VENTOLIN HFA) 108 (90 Base) MCG/ACT inhaler INHALE 2 PUFFS INTO THE LUNGS EVERY 6 HOURS AS NEEDED FOR WHEEZING OR SHORTNESS OF BREATH   amLODipine (NORVASC) 5 MG tablet Take 1 tablet (5 mg total) by mouth daily.   apixaban (ELIQUIS) 5 MG TABS tablet Take 1 tablet (5 mg total) by mouth 2 (two) times daily.   dofetilide (TIKOSYN) 500 MCG capsule Take 1 capsule by mouth twice daily   escitalopram (LEXAPRO) 20 MG tablet Take 1 tablet (20 mg total) by mouth daily.   FEROSUL 325 (65 Fe) MG tablet TAKE 1 TABLET DAILY   furosemide (LASIX)  20 MG tablet Take 1 tablet (20 mg total) by mouth daily.   gabapentin (NEURONTIN) 300 MG capsule Take 300 mg by mouth three times daily   lisinopril (ZESTRIL) 5 MG tablet Take 1 tablet (5 mg total) by mouth daily.   mirtazapine (REMERON) 7.5 MG tablet Take 1 tablet (7.5 mg total) by mouth at bedtime.   pantoprazole (PROTONIX) 40 MG tablet Take 1 tablet (40 mg total) by mouth 2 (two) times daily.   pravastatin (PRAVACHOL) 40 MG tablet Take 1 tablet (40 mg total) by mouth daily.   sucralfate (CARAFATE) 1 g tablet Take 1 tablet (1 g total) by mouth 2 (two) times daily.   tamsulosin (FLOMAX) 0.4 MG CAPS capsule Take 1 capsule (0.4 mg total) by mouth daily.   vitamin B-12 (CYANOCOBALAMIN) 1000 MCG tablet Take 1 tablet (1,000 mcg total) by mouth daily.   No facility-administered encounter medications on file as of 06/08/2022.    Allergies (verified) Hydrocodone-acetaminophen, Morphine and related, Other, and Valsartan   History: Past Medical History:  Diagnosis Date   AAA (abdominal aortic aneurysm) (HCC)    Atrial fibrillation ([place])    Brain tumor ([place])    x2 (benign per pt)   Cancer ([place])    melanoma removed twice (head and right forearm)  Cardiac arrhythmia    CHF (congestive heart failure) (HCC)    GERD (gastroesophageal reflux disease)    Guillain-Barre syndrome ([place]) 02/13/1986   Headache    Hypertension    Kidney stones    Myocardial infarction [facility]) 2007   Past Surgical History:  Procedure Laterality Date   ABDOMINAL AORTIC ENDOVASCULAR STENT GRAFT Bilateral 04/04/2022   Procedure: ABDOMINAL AORTIC ENDOVASCULAR STENT GRAFT;  Surgeon: Serafina Mitchell, MD;  Location: [facility];  Service: Vascular;  Laterality: Bilateral;   COLONOSCOPY WITH PROPOFOL N/A 08/14/2020   Procedure: COLONOSCOPY WITH PROPOFOL;  Surgeon: Rogene Houston, MD;  [facility];  Service: Endoscopy;  Laterality: N/A;   CORONARY ANGIOPLASTY  1997   after mi   CYSTOSCOPY WITH RETROGRADE PYELOGRAM,  URETEROSCOPY AND STENT PLACEMENT Left 06/28/2014   Procedure: 1ST STAGE CYSTOSCOPY/URETEROSCOPY/STENT PLACEMENT;  Surgeon: Alexis Frock, MD;  [facility];  Service: Urology;  Laterality: Left;   CYSTOSCOPY WITH STENT PLACEMENT Left 05/08/2014   Procedure: CYSTOSCOPY, RETROGRADE PYELOGRAM WITH LEFT URETERAL STENT PLACEMENT;  Surgeon: Alexis Frock, MD;  [facility];  Service: Urology;  Laterality: Left;   ENTEROSCOPY N/A 09/16/2020   Procedure: PUSH ENTEROSCOPY;  Surgeon: Harvel Quale, MD;  [facility];  Service: Gastroenterology;  Laterality: N/A;   ENTEROSCOPY N/A 10/25/2020   Procedure: PUSH ENTEROSCOPY;  Surgeon: Harvel Quale, MD;  [facility];  Service: Gastroenterology;  Laterality: N/A;   ESOPHAGOGASTRODUODENOSCOPY (EGD) WITH PROPOFOL N/A 12/16/2019   Procedure: ESOPHAGOGASTRODUODENOSCOPY (EGD) WITH PROPOFOL;  Surgeon: Mauri Pole, MD;  [facility];  Service: Endoscopy;  Laterality: N/A;   ESOPHAGOGASTRODUODENOSCOPY (EGD) WITH PROPOFOL N/A 08/13/2020   Procedure: ESOPHAGOGASTRODUODENOSCOPY (EGD) WITH PROPOFOL;  Surgeon: Rogene Houston, MD;  [facility];  Service: Endoscopy;  Laterality: N/A;   ESOPHAGOGASTRODUODENOSCOPY (EGD) WITH PROPOFOL N/A 09/16/2020   Procedure: ESOPHAGOGASTRODUODENOSCOPY (EGD) WITH PROPOFOL;  Surgeon: Harvel Quale, MD;  [facility];  Service: Gastroenterology;  Laterality: N/A;  1:55   ESOPHAGOGASTRODUODENOSCOPY (EGD) WITH PROPOFOL N/A 10/25/2020   Procedure: ESOPHAGOGASTRODUODENOSCOPY (EGD) WITH PROPOFOL;  Surgeon: Harvel Quale, MD;  [facility];  Service: Gastroenterology;  Laterality: N/A;  2:00   EYE SURGERY Right may 2015   growth removed, july 2015left eye cataract removed, right eye catarct removed   gamma kniferadiation treatment  Feb 09 2014   [facility]   GIVENS CAPSULE STUDY N/A 10/11/2020   Procedure: GIVENS  CAPSULE STUDY;  Surgeon: Harvel Quale, MD;  [facility];  Service: Gastroenterology;  Laterality: N/A;  7:30   HEMOSTASIS CLIP PLACEMENT  12/16/2019   Procedure: HEMOSTASIS CLIP PLACEMENT;  Surgeon: Mauri Pole, MD;  [facility];  Service: Endoscopy;;   HEMOSTASIS CONTROL  12/16/2019   Procedure: HEMOSTASIS CONTROL;  Surgeon: Mauri Pole, MD;  [facility];  Service: Endoscopy;;  Endoloop   HOLMIUM LASER APPLICATION Left [id number]   Procedure: HOLMIUM LASER APPLICATION;  Surgeon: Alexis Frock, MD;  [facility];  Service: Urology;  Laterality: Left;   LITHOTRIPSY  years ago   POLYPECTOMY  12/16/2019   Procedure: POLYPECTOMY;  Surgeon: Mauri Pole, MD;  [facility];  Service: Endoscopy;;   STONE EXTRACTION WITH BASKET Left 06/28/2014   Procedure: STONE EXTRACTION WITH BASKET;  Surgeon: Alexis Frock, MD;  [facility];  Service: Urology;  Laterality: Left;   Family History  Problem Relation Age of Onset   Diabetes Mother  Lung cancer Mother    CAD Father    Diabetes Brother    Diabetes Sister    Stroke Sister    Other Maternal Grandfather        brain tumor   Colon cancer Neg Hx    Pancreatic cancer Neg Hx    Esophageal cancer Neg Hx    Social History   Socioeconomic History   Marital status: Married    Spouse name: Enid Derry   Number of children: 1   Years of education: Not on file   Highest education level: Not on file  Occupational History   Occupation: retired   Tobacco Use   Smoking status: Never    Passive exposure: Never   Smokeless tobacco: Never  Vaping Use   Vaping Use: Never used  Substance and Sexual Activity   Alcohol use: No   Drug use: No   Sexual activity: Not Currently  Other Topics Concern   Not on file  Social History Narrative   Lives with wife - they have a basement but don't use it   One daughter - husband is a Environmental education officer and they go to his church   Patient is  paraplegic - uses wheelchair, sometimes motorized   He does still drive using hand controls.   Social Determinants of Health   Financial Resource Strain: Low Risk  (06/08/2022)   Overall Financial Resource Strain (CARDIA)    Difficulty of Paying Living Expenses: Not hard at all  Food Insecurity: No Food Insecurity (06/08/2022)   Hunger Vital Sign    Worried About Running Out of Food in the Last Year: Never true    Ran Out of Food in the Last Year: Never true  Transportation Needs: No Transportation Needs (06/08/2022)   PRAPARE - Hydrologist (Medical): No    Lack of Transportation (Non-Medical): No  Physical Activity: Insufficiently Active (06/08/2022)   Exercise Vital Sign    Days of Exercise per Week: 3 days    Minutes of Exercise per Session: 30 min  Stress: No Stress Concern Present (06/08/2022)   [place]    Feeling of Stress : Not at all  Social Connections: Moderately Integrated (06/08/2022)   Social Connection and Isolation Panel [NHANES]    Frequency of Communication with Friends and Family: More than three times a week    Frequency of Social Gatherings with Friends and Family: More than three times a week    Attends Religious Services: More than 4 times per year    Active Member of [employer] or Organizations: No    Attends Music therapist: Never    Marital Status: Married    Tobacco Counseling Counseling given: Not Answered   Clinical Intake:  Pre-visit preparation completed: Yes  Pain : No/denies pain     Nutritional Risks: None Diabetes: No  How often do you need to have someone help you when you read instructions, pamphlets, or other written materials from your doctor or pharmacy?: 1 - Never  Diabetic?no   Interpreter Needed?: No  Information entered by :: Jadene Pierini, LPN   Activities of Daily Living    03/23/2022   10:26 AM 03/23/2022   10:20 AM   In your present state of health, do you have any difficulty performing the following activities:  Hearing?  0  Vision?  0  Difficulty concentrating or making decisions?  0  Walking or climbing stairs?  1  Dressing  or bathing?  1  Doing errands, shopping? 0     Patient Care Team: Claretta Fraise, MD as PCP - General (Family Medicine) Loretta Plume as Consulting Physician (Neurosurgery) Alexis Frock, MD as Consulting Physician (Urology) Lavera Guise, [facility] (Pharmacist) Celestia Khat, OD (Optometry)  Indicate any recent Medical Services you may have received from other than [facility] providers in the past year (date may be approximate).     Assessment:   This is a routine wellness examination for [place].  Hearing/Vision screen Vision Screening - Comments:: Wears rx glasses - up to date with routine eye exams with  Dr.Johnson   Dietary issues and exercise activities discussed:     Goals Addressed             This Visit's Progress    Prevent falls   On track      Depression Screen    06/08/2022   11:24 AM 04/16/2022   10:03 AM 04/16/2022    9:52 AM 01/11/2022   10:06 AM 12/07/2021   10:35 AM 09/29/2021    9:59 AM 06/02/2021   10:05 AM  PHQ 2/9 Scores  PHQ - 2 Score 0 2 0 '2  1 4  '$ PHQ- 9 Score 0 '7  12  7 11  '$ Exception Documentation     Patient refusal      Fall Risk    06/08/2022   11:23 AM 04/16/2022   10:03 AM 04/16/2022    9:52 AM 01/11/2022   10:06 AM 12/07/2021   10:35 AM  Fall Risk   Falls in the past year? 0 0 0 Exclusion - non ambulatory 0  Number falls in past yr: 0      Injury with Fall? 0      Risk for fall due to : No Fall Risks      Follow up Falls prevention discussed        [place]:  Any stairs in or around the home? Yes  If so, are there any without handrails? No  Home free of loose throw rugs in walkways, pet beds, electrical cords, etc? Yes  Adequate lighting in your home to reduce risk of falls? Yes    ASSISTIVE DEVICES UTILIZED TO PREVENT FALLS:  Life alert? No  Use of a cane, walker or w/c? Yes  Grab bars in the bathroom? Yes  Shower chair or bench in shower? Yes  Elevated toilet seat or a handicapped toilet? Yes        06/08/2022   11:25 AM 05/23/2020    1:36 PM 05/19/2019   12:30 PM  6CIT Screen  What Year? 0 points 0 points 0 points  What month? 0 points 0 points 0 points  What time? 0 points 0 points 0 points  Count back from 20 0 points 0 points 0 points  Months in reverse 0 points 0 points 0 points  Repeat phrase 2 points 2 points 2 points  Total Score 2 points 2 points 2 points    Immunizations Immunization History  Administered Date(s) Administered   Pneumococcal Conjugate-13 01/15/2019   Pneumococcal Polysaccharide-23 02/03/2020    TDAP status: Due, Education has been provided regarding the importance of this vaccine. Advised may receive this vaccine at local pharmacy or Health Dept. Aware to provide a copy of the vaccination record if obtained from local pharmacy or Health Dept. Verbalized acceptance and understanding.  Flu Vaccine status: Declined, Education has been provided regarding the importance of  this vaccine but patient still declined. Advised may receive this vaccine at local pharmacy or Health Dept. Aware to provide a copy of the vaccination record if obtained from local pharmacy or Health Dept. Verbalized acceptance and understanding.  Pneumococcal vaccine status: Up to date  Covid-19 vaccine status: Declined, Education has been provided regarding the importance of this vaccine but patient still declined. Advised may receive this vaccine at local pharmacy or Health Dept.or vaccine clinic. Aware to provide a copy of the vaccination record if obtained from local pharmacy or Health Dept. Verbalized acceptance and understanding.  Qualifies for Shingles Vaccine? Yes   Zostavax completed No   Shingrix Completed?: No.    Education has been provided  regarding the importance of this vaccine. Patient has been advised to call insurance company to determine out of pocket expense if they have not yet received this vaccine. Advised may also receive vaccine at local pharmacy or Health Dept. Verbalized acceptance and understanding.  Screening Tests Health Maintenance  Topic Date Due   DTaP/Tdap/Td (1 - Tdap) Never done   INFLUENZA VACCINE  07/01/2022 (Originally 10/31/2021)   Medicare Annual Wellness (AWV)  06/08/2023   Pneumonia Vaccine 63+ Years old  Completed   HPV VACCINES  Aged Out   COVID-19 Vaccine  Discontinued   Zoster Vaccines- Shingrix  Discontinued    Health Maintenance  Health Maintenance Due  Topic Date Due   DTaP/Tdap/Td (1 - Tdap) Never done    Colorectal cancer screening: No longer required.   Lung Cancer Screening: (Low Dose CT Chest recommended if Age 69-80 years, 30 pack-year currently smoking OR have quit w/in 15years.) does not qualify.   Lung Cancer Screening Referral: n/a  Additional Screening:  Hepatitis C Screening: does not qualify;   Vision Screening: Recommended annual ophthalmology exams for early detection of glaucoma and other disorders of the eye. Is the patient up to date with their annual eye exam?  Yes  Who is the provider or what is the name of the office in which the patient attends annual eye exams? Dr.Johnson  If pt is not established with a provider, would they like to be referred to a provider to establish care? No .   Dental Screening: Recommended annual dental exams for proper oral hygiene  Community Resource Referral / Chronic Care Management: CRR required this visit?  No   CCM required this visit?  No      Plan:     I have personally reviewed and noted the following in the patient's chart:   Medical and social history Use of alcohol, tobacco or illicit drugs  Current medications and supplements including opioid prescriptions. Patient is not currently taking opioid  prescriptions. Functional ability and status Nutritional status Physical activity Advanced directives List of other physicians Hospitalizations, surgeries, and ER visits in previous 12 months Vitals Screenings to include cognitive, depression, and falls Referrals and appointments  In addition, I have reviewed and discussed with patient certain preventive protocols, quality metrics, and best practice recommendations. A written personalized care plan for preventive services as well as general preventive health recommendations were provided to patient.     Daphane Shepherd, LPN   [id number]   Nurse Notes: none

## 2022-06-08 NOTE — Patient Instructions (Signed)
Theodore Mendoza , Thank you for taking time to come for your Medicare Wellness Visit. I appreciate your ongoing commitment to your health goals. Please review the following plan we discussed and let me know if I can assist you in the future.   These are the goals we discussed:  Goals       AFIB, HTN PHARMD (pt-stated)      Current Barriers:  Unable to independently afford treatment regimen  Pharmacist Clinical Goal(s):  patient will verbalize ability to afford treatment regimen through collaboration with PharmD and provider.   Interventions: 1:1 collaboration with Loman Brooklyn, FNP regarding development and update of comprehensive plan of care as evidenced by provider attestation and co-signature Inter-disciplinary care team collaboration (see longitudinal plan of care) Comprehensive medication review performed; medication list updated in electronic medical record  Atrial Fibrillation: Controlled/challenging for patient to afford Current rate/rhythm control: TIKOSYN; anticoagulant treatment: ELIQUIS Paroxysmal AF on Eliquis -CHADS2-VASc Score is 90 (age, HTN, vascular disease, hx of prior CVA) Home blood pressure, heart rate readings:  Assessed patient finances.   TIKOSYN GENERIC NOW $75 PER MONTH on cash NO PAP FOR TIKOSYN BUT ABLE TO DO GOOD RX AT [facility] FOR $45 (PATIENT PICKED UP!) ELIQUIS PAP ONLY WHEN PATIENT AND WIFE MEET 3% OUT OF POCKET WHICH WOULD BE AROUND $875 BETWEEN THEM ADDITIONAL SAMPLES OF ELIQUIS LEFT OF FRONT TODAY WILL F/U WITH PATIENT AS INFORMATION IS DISCOVERED STABLE ON CURRENT REGIMEN; denies signs/symptoms of bleeding  Patient Goals/Self-Care Activities patient will:  - take medications as prescribed as evidenced by patient report and record review collaborate with provider on medication access solutions       Client will verbalize knowledge of self management of Hypertension      Prevent falls        This is a list of the screening recommended for  you and due dates:  Health Maintenance  Topic Date Due   DTaP/Tdap/Td vaccine (1 - Tdap) Never done   Flu Shot  07/01/2022*   Medicare Annual Wellness Visit  06/08/2023   Pneumonia Vaccine  Completed   HPV Vaccine  Aged Out   COVID-19 Vaccine  Discontinued   Zoster (Shingles) Vaccine  Discontinued  *Topic was postponed. The date shown is not the original due date.    Advanced directives: Advance directive discussed with you today. I have provided a copy for you to complete at home and have notarized. Once this is complete please bring a copy in to our office so we can scan it into your chart.   Conditions/risks identified: Aim for 30 minutes of exercise or brisk walking, 6-8 glasses of water, and 5 servings of fruits and vegetables each day.   Next appointment: Follow up in one year for your annual wellness visit.   Preventive Care 52 Years and Older, Male  Preventive care refers to lifestyle choices and visits with your health care provider that can promote health and wellness. What does preventive care include? A yearly physical exam. This is also called an annual well check. Dental exams once or twice a year. Routine eye exams. Ask your health care provider how often you should have your eyes checked. Personal lifestyle choices, including: Daily care of your teeth and gums. Regular physical activity. Eating a healthy diet. Avoiding tobacco and drug use. Limiting alcohol use. Practicing safe sex. Taking low doses of aspirin every day. Taking vitamin and mineral supplements as recommended by your health care provider. What happens during an annual well  check? The services and screenings done by your health care provider during your annual well check will depend on your age, overall health, lifestyle risk factors, and family history of disease. Counseling  Your health care provider may ask you questions about your: Alcohol use. Tobacco use. Drug use. Emotional  well-being. Home and relationship well-being. Sexual activity. Eating habits. History of falls. Memory and ability to understand (cognition). Work and work Statistician. Screening  You may have the following tests or measurements: Height, weight, and BMI. Blood pressure. Lipid and cholesterol levels. These may be checked every 5 years, or more frequently if you are over 62 years old. Skin check. Lung cancer screening. You may have this screening every year starting at age 59 if you have a 30-pack-year history of smoking and currently smoke or have quit within the past 15 years. Fecal occult blood test (FOBT) of the stool. You may have this test every year starting at age 67. Flexible sigmoidoscopy or colonoscopy. You may have a sigmoidoscopy every 5 years or a colonoscopy every 10 years starting at age 52. Prostate cancer screening. Recommendations will vary depending on your family history and other risks. Hepatitis C blood test. Hepatitis B blood test. Sexually transmitted disease (STD) testing. Diabetes screening. This is done by checking your blood sugar (glucose) after you have not eaten for a while (fasting). You may have this done every 1-3 years. Abdominal aortic aneurysm (AAA) screening. You may need this if you are a current or former smoker. Osteoporosis. You may be screened starting at age 26 if you are at high risk. Talk with your health care provider about your test results, treatment options, and if necessary, the need for more tests. Vaccines  Your health care provider may recommend certain vaccines, such as: Influenza vaccine. This is recommended every year. Tetanus, diphtheria, and acellular pertussis (Tdap, Td) vaccine. You may need a Td booster every 10 years. Zoster vaccine. You may need this after age 14. Pneumococcal 13-valent conjugate (PCV13) vaccine. One dose is recommended after age 76. Pneumococcal polysaccharide (PPSV23) vaccine. One dose is recommended after  age 4. Talk to your health care provider about which screenings and vaccines you need and how often you need them. This information is not intended to replace advice given to you by your health care provider. Make sure you discuss any questions you have with your health care provider. Document Released: 04/15/2015 Document Revised: 12/07/2015 Document Reviewed: 01/18/2015 Elsevier Interactive Patient Education  2017 [place] Prevention in the Home Falls can cause injuries. They can happen to people of all ages. There are many things you can do to make your home safe and to help prevent falls. What can I do on the outside of my home? Regularly fix the edges of walkways and driveways and fix any cracks. Remove anything that might make you trip as you walk through a door, such as a raised step or threshold. Trim any bushes or trees on the path to your home. Use bright outdoor lighting. Clear any walking paths of anything that might make someone trip, such as rocks or tools. Regularly check to see if handrails are loose or broken. Make sure that both sides of any steps have handrails. Any raised decks and porches should have guardrails on the edges. Have any leaves, snow, or ice cleared regularly. Use sand or salt on walking paths during winter. Clean up any spills in your garage right away. This includes oil or grease spills. What can  I do in the bathroom? Use night lights. Install grab bars by the toilet and in the tub and shower. Do not use towel bars as grab bars. Use non-skid mats or decals in the tub or shower. If you need to sit down in the shower, use a plastic, non-slip stool. Keep the floor dry. Clean up any water that spills on the floor as soon as it happens. Remove soap buildup in the tub or shower regularly. Attach bath mats securely with double-sided non-slip rug tape. Do not have throw rugs and other things on the floor that can make you trip. What can I do in the  bedroom? Use night lights. Make sure that you have a light by your bed that is easy to reach. Do not use any sheets or blankets that are too big for your bed. They should not hang down onto the floor. Have a firm chair that has side arms. You can use this for support while you get dressed. Do not have throw rugs and other things on the floor that can make you trip. What can I do in the kitchen? Clean up any spills right away. Avoid walking on wet floors. Keep items that you use a lot in easy-to-reach places. If you need to reach something above you, use a strong step stool that has a grab bar. Keep electrical cords out of the way. Do not use floor polish or wax that makes floors slippery. If you must use wax, use non-skid floor wax. Do not have throw rugs and other things on the floor that can make you trip. What can I do with my stairs? Do not leave any items on the stairs. Make sure that there are handrails on both sides of the stairs and use them. Fix handrails that are broken or loose. Make sure that handrails are as long as the stairways. Check any carpeting to make sure that it is firmly attached to the stairs. Fix any carpet that is loose or worn. Avoid having throw rugs at the top or bottom of the stairs. If you do have throw rugs, attach them to the floor with carpet tape. Make sure that you have a light switch at the top of the stairs and the bottom of the stairs. If you do not have them, ask someone to add them for you. What else can I do to help prevent falls? Wear shoes that: Do not have high heels. Have rubber bottoms. Are comfortable and fit you well. Are closed at the toe. Do not wear sandals. If you use a stepladder: Make sure that it is fully opened. Do not climb a closed stepladder. Make sure that both sides of the stepladder are locked into place. Ask someone to hold it for you, if possible. Clearly mark and make sure that you can see: Any grab bars or  handrails. First and last steps. Where the edge of each step is. Use tools that help you move around (mobility aids) if they are needed. These include: Canes. Walkers. Scooters. Crutches. Turn on the lights when you go into a dark area. Replace any light bulbs as soon as they burn out. Set up your furniture so you have a clear path. Avoid moving your furniture around. If any of your floors are uneven, fix them. If there are any pets around you, be aware of where they are. Review your medicines with your doctor. Some medicines can make you feel dizzy. This can increase your chance of falling.  Ask your doctor what other things that you can do to help prevent falls. This information is not intended to replace advice given to you by your health care provider. Make sure you discuss any questions you have with your health care provider. Document Released: 01/13/2009 Document Revised: 08/25/2015 Document Reviewed: 04/23/2014 Elsevier Interactive Patient Education  2017 [employer].

## 2022-06-11 DIAGNOSIS — D509 Iron deficiency anemia, unspecified: Secondary | ICD-10-CM | POA: Diagnosis not present

## 2022-06-12 LAB — HEMOGLOBIN AND HEMATOCRIT, BLOOD
HCT: 37.1 % — ABNORMAL LOW (ref 38.5–50.0)
Hemoglobin: 11.7 g/dL — ABNORMAL LOW (ref 13.2–17.1)

## 2022-06-12 LAB — IRON,TIBC AND FERRITIN PANEL
%SAT: 36 % (calc) (ref 20–48)
Ferritin: 27 ng/mL (ref 24–380)
Iron: 109 ug/dL (ref 50–180)
TIBC: 307 mcg/dL (calc) (ref 250–425)

## 2022-06-15 ENCOUNTER — Other Ambulatory Visit: Payer: Self-pay | Admitting: Family Medicine

## 2022-06-15 DIAGNOSIS — I4819 Other persistent atrial fibrillation: Secondary | ICD-10-CM

## 2022-07-09 DIAGNOSIS — G610 Guillain-Barre syndrome: Secondary | ICD-10-CM | POA: Diagnosis not present

## 2022-07-09 DIAGNOSIS — M21171 Varus deformity, not elsewhere classified, right ankle: Secondary | ICD-10-CM | POA: Diagnosis not present

## 2022-07-09 DIAGNOSIS — M21372 Foot drop, left foot: Secondary | ICD-10-CM | POA: Diagnosis not present

## 2022-07-09 DIAGNOSIS — M21371 Foot drop, right foot: Secondary | ICD-10-CM | POA: Diagnosis not present

## 2022-07-16 ENCOUNTER — Encounter: Payer: Self-pay | Admitting: Family Medicine

## 2022-07-16 ENCOUNTER — Ambulatory Visit (INDEPENDENT_AMBULATORY_CARE_PROVIDER_SITE_OTHER): Payer: Medicare Other | Admitting: Family Medicine

## 2022-07-16 VITALS — BP 139/62 | HR 60 | Temp 98.0°F | Ht 68.0 in | Wt 150.0 lb

## 2022-07-16 DIAGNOSIS — Z7901 Long term (current) use of anticoagulants: Secondary | ICD-10-CM

## 2022-07-16 DIAGNOSIS — E782 Mixed hyperlipidemia: Secondary | ICD-10-CM

## 2022-07-16 DIAGNOSIS — I10 Essential (primary) hypertension: Secondary | ICD-10-CM | POA: Diagnosis not present

## 2022-07-16 DIAGNOSIS — G610 Guillain-Barre syndrome: Secondary | ICD-10-CM | POA: Diagnosis not present

## 2022-07-16 DIAGNOSIS — G8222 Paraplegia, incomplete: Secondary | ICD-10-CM

## 2022-07-16 DIAGNOSIS — I4819 Other persistent atrial fibrillation: Secondary | ICD-10-CM

## 2022-07-16 MED ORDER — DOFETILIDE 500 MCG PO CAPS: 500.0000 ug | ORAL_CAPSULE | Freq: Two times a day (BID) | ORAL | 3 refills | Status: DC

## 2022-07-16 NOTE — Progress Notes (Signed)
Subjective:  Patient ID: Theodore Mendoza, male    DOB: 11-19-1940  Age: 82 y.o. MRN: [id number]  CC: Medical Management of Chronic Issues   HPI Theodore Mendoza presents for  follow-up of hypertension. Patient has no history of headache chest pain or shortness of breath or recent cough. Patient also denies symptoms of TIA such as focal numbness or weakness. Patient denies side effects from medication. States taking it regularly.   in for follow-up of elevated cholesterol. Doing well without complaints on current medication. Denies side effects of statin including myalgia and arthralgia and nausea. Currently no chest pain, shortness of breath or other cardiovascular related symptoms noted.  Atrial fibrillation follow up. Pt. is treated with rate control and anticoagulation. Pt.  denies palpitations, rapid rate, chest pain, dyspnea and edema. There has been no bleeding from nose or gums. Pt. has not noticed blood with urine or stool.  Although there is routine bruising easily, it is not excessive.   History Theodore Mendoza has a past medical history of AAA (abdominal aortic aneurysm), Atrial fibrillation, Brain tumor, Cancer, Cardiac arrhythmia, CHF (congestive heart failure), GERD (gastroesophageal reflux disease), Guillain-Barre syndrome (02/13/1986), Headache, Hypertension, Kidney stones, and Myocardial infarction (2007).   He has a past surgical history that includes Cystoscopy with stent placement (Left, 05/08/2014); Coronary angioplasty (1997); Eye surgery (Right, may 2015); gamma kniferadiation treatment (Feb 09 2014); Lithotripsy (years ago); Cystoscopy with retrograde pyelogram, ureteroscopy and stent placement (Left, 06/28/2014); Holmium laser application (Left, 06/28/2014); Stone extraction with basket (Left, 06/28/2014); Esophagogastroduodenoscopy (egd) with propofol (N/A, 12/16/2019); Hemostasis clip placement (12/16/2019); Hemostasis control (12/16/2019); polypectomy (12/16/2019); Esophagogastroduodenoscopy  (egd) with propofol (N/A, 08/13/2020); Colonoscopy with propofol (N/A, 08/14/2020); Esophagogastroduodenoscopy (egd) with propofol (N/A, 09/16/2020); enteroscopy (N/A, 09/16/2020); Givens capsule study (N/A, 10/11/2020); Esophagogastroduodenoscopy (egd) with propofol (N/A, 10/25/2020); enteroscopy (N/A, 10/25/2020); and Abdominal aortic endovascular stent graft (Bilateral, 04/04/2022).   His family history includes CAD in his father; Diabetes in his brother, mother, and sister; Lung cancer in his mother; Other in his maternal grandfather; Stroke in his sister.He reports that he has never smoked. He has never been exposed to tobacco smoke. He has never used smokeless tobacco. He reports that he does not drink alcohol and does not use drugs.  Current Outpatient Medications on File Prior to Visit  Medication Sig Dispense Refill   albuterol (VENTOLIN HFA) 108 (90 Base) MCG/ACT inhaler INHALE 2 PUFFS INTO THE LUNGS EVERY 6 HOURS AS NEEDED FOR WHEEZING OR SHORTNESS OF BREATH 8.5 g 1   amLODipine (NORVASC) 5 MG tablet Take 1 tablet (5 mg total) by mouth daily. 90 tablet 3   apixaban (ELIQUIS) 5 MG TABS tablet TAKE ONE TABLET BY MOUTH TWICE DAILY 180 tablet 0   escitalopram (LEXAPRO) 20 MG tablet Take 1 tablet (20 mg total) by mouth daily. 90 tablet 3   FEROSUL 325 (65 Fe) MG tablet TAKE 1 TABLET DAILY 90 tablet 0   furosemide (LASIX) 20 MG tablet Take 1 tablet (20 mg total) by mouth daily. 90 tablet 3   gabapentin (NEURONTIN) 300 MG capsule Take 300 mg by mouth three times daily 360 capsule 1   lisinopril (ZESTRIL) 5 MG tablet Take 1 tablet (5 mg total) by mouth daily. 90 tablet 3   mirtazapine (REMERON) 7.5 MG tablet Take 1 tablet (7.5 mg total) by mouth at bedtime. 90 tablet 3   pantoprazole (PROTONIX) 40 MG tablet Take 1 tablet (40 mg total) by mouth 2 (two) times daily. 180 tablet 3   pravastatin (  PRAVACHOL) 40 MG tablet Take 1 tablet (40 mg total) by mouth daily. 90 tablet 3   sucralfate (CARAFATE) 1 g tablet  Take 1 tablet (1 g total) by mouth 2 (two) times daily. 180 tablet 3   tamsulosin (FLOMAX) 0.4 MG CAPS capsule Take 1 capsule (0.4 mg total) by mouth daily. 90 capsule 3   vitamin B-12 (CYANOCOBALAMIN) 1000 MCG tablet Take 1 tablet (1,000 mcg total) by mouth daily. 30 tablet 2   No current facility-administered medications on file prior to visit.    ROS Review of Systems  Constitutional:  Negative for fever.  Respiratory:  Negative for shortness of breath.   Cardiovascular:  Negative for chest pain.  Musculoskeletal:  Positive for gait problem (w/c bound due to paralysis of long term Guillan Barre Syndrome). Negative for arthralgias.  Skin:  Negative for rash.    Objective:  BP 139/62   Pulse 60   Temp 98 F (36.7 C)   Ht 5\' 8"  (1.727 m)   Wt 150 lb (68 kg)   SpO2 97%   BMI 22.81 kg/m   BP Readings from Last 3 Encounters:  07/16/22 139/62  05/14/22 127/66  04/16/22 (!) 116/57    Wt Readings from Last 3 Encounters:  07/16/22 150 lb (68 kg)  06/08/22 150 lb (68 kg)  05/14/22 150 lb (68 kg)     Physical Exam Vitals reviewed.  Constitutional:      Appearance: He is well-developed.  HENT:     Head: Normocephalic and atraumatic.     Right Ear: External ear normal.     Left Ear: External ear normal.     Mouth/Throat:     Pharynx: No oropharyngeal exudate or posterior oropharyngeal erythema.  Eyes:     Pupils: Pupils are equal, round, and reactive to light.  Cardiovascular:     Rate and Rhythm: Normal rate and regular rhythm.     Heart sounds: No murmur heard. Pulmonary:     Effort: No respiratory distress.     Breath sounds: Normal breath sounds.  Musculoskeletal:        General: Deformity (in WC due to weakness of legs) present.     Cervical back: Normal range of motion and neck supple.  Skin:    General: Skin is warm and dry.  Neurological:     Mental Status: He is alert and oriented to person, place, and time.       Assessment & Plan:   Theodore Mendoza was  seen today for medical management of chronic issues.  Diagnoses and all orders for this visit:  Essential (primary) hypertension -     CBC with Differential/Platelet -     CMP14+EGFR  Mixed hyperlipidemia -     Lipid panel  Guillain Barr syndrome  Long term current use of anticoagulant  Persistent atrial fibrillation  Paraplegia, incomplete  Other orders -     dofetilide (TIKOSYN) 500 MCG capsule; Take 1 capsule (500 mcg total) by mouth 2 (two) times daily.   Allergies as of 07/16/2022       Reactions   Hydrocodone-acetaminophen    Passed out and got real weak   Morphine And Related Other (See Comments)   Reaction:  Confusion/weakness/hallucinations   Other Other (See Comments)   Reaction:  Confusion/weakness/hallucinations   Valsartan Rash        Medication List        Accurate as of July 16, 2022  9:41 AM. If you have any questions, ask your nurse  or doctor.          albuterol 108 (90 Base) MCG/ACT inhaler Commonly known as: VENTOLIN HFA INHALE 2 PUFFS INTO THE LUNGS EVERY 6 HOURS AS NEEDED FOR WHEEZING OR SHORTNESS OF BREATH   amLODipine 5 MG tablet Commonly known as: NORVASC Take 1 tablet (5 mg total) by mouth daily.   cyanocobalamin 1000 MCG tablet Commonly known as: VITAMIN B12 Take 1 tablet (1,000 mcg total) by mouth daily.   dofetilide 500 MCG capsule Commonly known as: TIKOSYN Take 1 capsule (500 mcg total) by mouth 2 (two) times daily.   Eliquis 5 MG Tabs tablet Generic drug: apixaban TAKE ONE TABLET BY MOUTH TWICE DAILY   escitalopram 20 MG tablet Commonly known as: LEXAPRO Take 1 tablet (20 mg total) by mouth daily.   FeroSul 325 (65 FE) MG tablet Generic drug: ferrous sulfate TAKE 1 TABLET DAILY   furosemide 20 MG tablet Commonly known as: LASIX Take 1 tablet (20 mg total) by mouth daily.   gabapentin 300 MG capsule Commonly known as: NEURONTIN Take 300 mg by mouth three times daily   lisinopril 5 MG tablet Commonly  known as: ZESTRIL Take 1 tablet (5 mg total) by mouth daily.   mirtazapine 7.5 MG tablet Commonly known as: REMERON Take 1 tablet (7.5 mg total) by mouth at bedtime.   pantoprazole 40 MG tablet Commonly known as: PROTONIX Take 1 tablet (40 mg total) by mouth 2 (two) times daily.   pravastatin 40 MG tablet Commonly known as: PRAVACHOL Take 1 tablet (40 mg total) by mouth daily.   sucralfate 1 g tablet Commonly known as: CARAFATE Take 1 tablet (1 g total) by mouth 2 (two) times daily.   tamsulosin 0.4 MG Caps capsule Commonly known as: FLOMAX Take 1 capsule (0.4 mg total) by mouth daily.        Meds ordered this encounter  Medications   dofetilide (TIKOSYN) 500 MCG capsule    Sig: Take 1 capsule (500 mcg total) by mouth 2 (two) times daily.    Dispense:  180 capsule    Refill:  3    Will see pharmacist for medications assistance due to cost.  Follow-up: Return in about 3 months (around 10/15/2022).  Mechele Claude, M.D.

## 2022-07-17 DIAGNOSIS — Z85828 Personal history of other malignant neoplasm of skin: Secondary | ICD-10-CM | POA: Diagnosis not present

## 2022-07-17 DIAGNOSIS — L570 Actinic keratosis: Secondary | ICD-10-CM | POA: Diagnosis not present

## 2022-07-17 LAB — CBC WITH DIFFERENTIAL/PLATELET
Basophils Absolute: 0.1 10*3/uL (ref 0.0–0.2)
Basos: 1 %
EOS (ABSOLUTE): 0.4 10*3/uL (ref 0.0–0.4)
Eos: 7 %
Hematocrit: 37.1 % — ABNORMAL LOW (ref 37.5–51.0)
Hemoglobin: 11.8 g/dL — ABNORMAL LOW (ref 13.0–17.7)
Immature Grans (Abs): 0 10*3/uL (ref 0.0–0.1)
Immature Granulocytes: 0 %
Lymphocytes Absolute: 1.3 10*3/uL (ref 0.7–3.1)
Lymphs: 23 %
MCH: 28.2 pg (ref 26.6–33.0)
MCHC: 31.8 g/dL (ref 31.5–35.7)
MCV: 89 fL (ref 79–97)
Monocytes Absolute: 0.3 10*3/uL (ref 0.1–0.9)
Monocytes: 5 %
Neutrophils Absolute: 3.8 10*3/uL (ref 1.4–7.0)
Neutrophils: 64 %
Platelets: 172 10*3/uL (ref 150–450)
RBC: 4.19 x10E6/uL (ref 4.14–5.80)
RDW: 12.4 % (ref 11.6–15.4)
WBC: 5.9 10*3/uL (ref 3.4–10.8)

## 2022-07-17 LAB — CMP14+EGFR
ALT: 9 IU/L (ref 0–44)
AST: 12 IU/L (ref 0–40)
Albumin/Globulin Ratio: 2 (ref 1.2–2.2)
Albumin: 3.8 g/dL (ref 3.7–4.7)
Alkaline Phosphatase: 72 IU/L (ref 44–121)
BUN/Creatinine Ratio: 18 (ref 10–24)
BUN: 14 mg/dL (ref 8–27)
Bilirubin Total: 0.4 mg/dL (ref 0.0–1.2)
CO2: 31 mmol/L — ABNORMAL HIGH (ref 20–29)
Calcium: 8.8 mg/dL (ref 8.6–10.2)
Chloride: 102 mmol/L (ref 96–106)
Creatinine, Ser: 0.76 mg/dL (ref 0.76–1.27)
Globulin, Total: 1.9 g/dL (ref 1.5–4.5)
Glucose: 90 mg/dL (ref 70–99)
Potassium: 3.8 mmol/L (ref 3.5–5.2)
Sodium: 145 mmol/L — ABNORMAL HIGH (ref 134–144)
Total Protein: 5.7 g/dL — ABNORMAL LOW (ref 6.0–8.5)
eGFR: 89 mL/min/{1.73_m2} (ref >59)

## 2022-07-17 LAB — LIPID PANEL
Chol/HDL Ratio: 2.2 ratio (ref 0.0–5.0)
Cholesterol, Total: 110 mg/dL (ref 100–199)
HDL: 49 mg/dL (ref >39)
LDL Chol Calc (NIH): 51 mg/dL (ref 0–99)
Triglycerides: 38 mg/dL (ref 0–149)
VLDL Cholesterol Cal: 10 mg/dL (ref 5–40)

## 2022-07-17 NOTE — Progress Notes (Signed)
Hello Kaitlyn,  Your lab result is normal and/or stable.Some minor variations that are not significant are commonly marked abnormal, but do not represent any medical problem for you.  Best regards, Kyliah Deanda, M.D.

## 2022-07-31 ENCOUNTER — Emergency Department (HOSPITAL_COMMUNITY): Payer: Medicare Other

## 2022-07-31 ENCOUNTER — Inpatient Hospital Stay (HOSPITAL_COMMUNITY)
Admission: EM | Admit: 2022-07-31 | Discharge: 2022-08-03 | DRG: 871 | Disposition: A | Discharge status: Home / Self Care | Payer: Medicare Other | Attending: Internal Medicine | Admitting: Internal Medicine

## 2022-07-31 ENCOUNTER — Other Ambulatory Visit: Payer: Self-pay

## 2022-07-31 ENCOUNTER — Encounter (HOSPITAL_COMMUNITY): Payer: Self-pay | Admitting: Emergency Medicine

## 2022-07-31 DIAGNOSIS — Z885 Allergy status to narcotic agent status: Secondary | ICD-10-CM | POA: Diagnosis not present

## 2022-07-31 DIAGNOSIS — Z8249 Family history of ischemic heart disease and other diseases of the circulatory system: Secondary | ICD-10-CM

## 2022-07-31 DIAGNOSIS — N390 Urinary tract infection, site not specified: Secondary | ICD-10-CM | POA: Diagnosis present

## 2022-07-31 DIAGNOSIS — I10 Essential (primary) hypertension: Secondary | ICD-10-CM | POA: Diagnosis not present

## 2022-07-31 DIAGNOSIS — R509 Fever, unspecified: Secondary | ICD-10-CM | POA: Diagnosis not present

## 2022-07-31 DIAGNOSIS — F411 Generalized anxiety disorder: Secondary | ICD-10-CM | POA: Diagnosis not present

## 2022-07-31 DIAGNOSIS — Z79899 Other long term (current) drug therapy: Secondary | ICD-10-CM | POA: Diagnosis not present

## 2022-07-31 DIAGNOSIS — G610 Guillain-Barre syndrome: Secondary | ICD-10-CM | POA: Diagnosis present

## 2022-07-31 DIAGNOSIS — E785 Hyperlipidemia, unspecified: Secondary | ICD-10-CM | POA: Diagnosis present

## 2022-07-31 DIAGNOSIS — I252 Old myocardial infarction: Secondary | ICD-10-CM | POA: Diagnosis not present

## 2022-07-31 DIAGNOSIS — Z9861 Coronary angioplasty status: Secondary | ICD-10-CM

## 2022-07-31 DIAGNOSIS — R0902 Hypoxemia: Secondary | ICD-10-CM | POA: Diagnosis not present

## 2022-07-31 DIAGNOSIS — I4819 Other persistent atrial fibrillation: Secondary | ICD-10-CM | POA: Diagnosis not present

## 2022-07-31 DIAGNOSIS — Z85820 Personal history of malignant melanoma of skin: Secondary | ICD-10-CM | POA: Diagnosis not present

## 2022-07-31 DIAGNOSIS — Z87442 Personal history of urinary calculi: Secondary | ICD-10-CM

## 2022-07-31 DIAGNOSIS — Z7901 Long term (current) use of anticoagulants: Secondary | ICD-10-CM | POA: Diagnosis not present

## 2022-07-31 DIAGNOSIS — A419 Sepsis, unspecified organism: Secondary | ICD-10-CM | POA: Diagnosis not present

## 2022-07-31 DIAGNOSIS — Z1152 Encounter for screening for COVID-19: Secondary | ICD-10-CM

## 2022-07-31 DIAGNOSIS — J9601 Acute respiratory failure with hypoxia: Secondary | ICD-10-CM | POA: Diagnosis present

## 2022-07-31 DIAGNOSIS — I110 Hypertensive heart disease with heart failure: Secondary | ICD-10-CM | POA: Diagnosis present

## 2022-07-31 DIAGNOSIS — E860 Dehydration: Secondary | ICD-10-CM | POA: Diagnosis not present

## 2022-07-31 DIAGNOSIS — K219 Gastro-esophageal reflux disease without esophagitis: Secondary | ICD-10-CM | POA: Diagnosis present

## 2022-07-31 DIAGNOSIS — N3001 Acute cystitis with hematuria: Secondary | ICD-10-CM

## 2022-07-31 DIAGNOSIS — R6520 Severe sepsis without septic shock: Secondary | ICD-10-CM | POA: Diagnosis not present

## 2022-07-31 DIAGNOSIS — N3000 Acute cystitis without hematuria: Secondary | ICD-10-CM | POA: Diagnosis not present

## 2022-07-31 LAB — CBC WITH DIFFERENTIAL/PLATELET
Abs Immature Granulocytes: 0.05 10*3/uL (ref 0.00–0.07)
Basophils Absolute: 0.1 10*3/uL (ref 0.0–0.1)
Basophils Relative: 0 %
Eosinophils Absolute: 0 10*3/uL (ref 0.0–0.5)
Eosinophils Relative: 0 %
HCT: 36.4 % — ABNORMAL LOW (ref 39.0–52.0)
Hemoglobin: 11.5 g/dL — ABNORMAL LOW (ref 13.0–17.0)
Immature Granulocytes: 0 %
Lymphocytes Relative: 3 %
Lymphs Abs: 0.4 10*3/uL — ABNORMAL LOW (ref 0.7–4.0)
MCH: 29 pg (ref 26.0–34.0)
MCHC: 31.6 g/dL (ref 30.0–36.0)
MCV: 91.9 fL (ref 80.0–100.0)
Monocytes Absolute: 0.6 10*3/uL (ref 0.1–1.0)
Monocytes Relative: 6 %
Neutro Abs: 10.1 10*3/uL — ABNORMAL HIGH (ref 1.7–7.7)
Neutrophils Relative %: 91 %
Platelets: 138 10*3/uL — ABNORMAL LOW (ref 150–400)
RBC: 3.96 MIL/uL — ABNORMAL LOW (ref 4.22–5.81)
RDW: 13.8 % (ref 11.5–15.5)
WBC: 11.2 10*3/uL — ABNORMAL HIGH (ref 4.0–10.5)
nRBC: 0 % (ref 0.0–0.2)

## 2022-07-31 LAB — COMPREHENSIVE METABOLIC PANEL
ALT: 9 U/L (ref 0–44)
AST: 16 U/L (ref 15–41)
Albumin: 3.7 g/dL (ref 3.5–5.0)
Alkaline Phosphatase: 62 U/L (ref 38–126)
Anion gap: 8 (ref 5–15)
BUN: 19 mg/dL (ref 8–23)
CO2: 31 mmol/L (ref 22–32)
Calcium: 8.6 mg/dL — ABNORMAL LOW (ref 8.9–10.3)
Chloride: 100 mmol/L (ref 98–111)
Creatinine, Ser: 0.98 mg/dL (ref 0.61–1.24)
GFR, Estimated: >60 mL/min (ref >60)
Glucose, Bld: 122 mg/dL — ABNORMAL HIGH (ref 70–99)
Potassium: 3.4 mmol/L — ABNORMAL LOW (ref 3.5–5.1)
Sodium: 139 mmol/L (ref 135–145)
Total Bilirubin: 1.3 mg/dL — ABNORMAL HIGH (ref 0.3–1.2)
Total Protein: 6.5 g/dL (ref 6.5–8.1)

## 2022-07-31 LAB — URINALYSIS, W/ REFLEX TO CULTURE (INFECTION SUSPECTED)
Bilirubin Urine: NEGATIVE
Glucose, UA: NEGATIVE mg/dL
Ketones, ur: NEGATIVE mg/dL
Nitrite: NEGATIVE
Protein, ur: 100 mg/dL — AB
RBC / HPF: >50 RBC/hpf (ref 0–5)
Specific Gravity, Urine: 1.018 (ref 1.005–1.030)
WBC, UA: >50 WBC/hpf (ref 0–5)
pH: 5 (ref 5.0–8.0)

## 2022-07-31 LAB — PROTIME-INR
INR: 2 — ABNORMAL HIGH (ref 0.8–1.2)
Prothrombin Time: 22.8 seconds — ABNORMAL HIGH (ref 11.4–15.2)

## 2022-07-31 LAB — RESP PANEL BY RT-PCR (RSV, FLU A&B, COVID)  RVPGX2
Influenza A by PCR: NEGATIVE
Influenza B by PCR: NEGATIVE
Resp Syncytial Virus by PCR: NEGATIVE
SARS Coronavirus 2 by RT PCR: NEGATIVE

## 2022-07-31 LAB — APTT: aPTT: 39 seconds — ABNORMAL HIGH (ref 24–36)

## 2022-07-31 LAB — LACTIC ACID, PLASMA: Lactic Acid, Venous: 1.1 mmol/L (ref 0.5–1.9)

## 2022-07-31 LAB — LIPASE, BLOOD: Lipase: 19 U/L (ref 11–51)

## 2022-07-31 MED ORDER — ESCITALOPRAM OXALATE 10 MG PO TABS
20.0000 mg | ORAL_TABLET | Freq: Every day | ORAL | Status: DC
  Administered 2022-08-01 – 2022-08-03 (×3): 20 mg via ORAL
  Filled 2022-07-31 (×3): qty 2

## 2022-07-31 MED ORDER — PANTOPRAZOLE SODIUM 40 MG PO TBEC
40.0000 mg | DELAYED_RELEASE_TABLET | Freq: Two times a day (BID) | ORAL | Status: DC
  Administered 2022-07-31 – 2022-08-03 (×6): 40 mg via ORAL
  Filled 2022-07-31 (×6): qty 1

## 2022-07-31 MED ORDER — AMLODIPINE BESYLATE 5 MG PO TABS
5.0000 mg | ORAL_TABLET | Freq: Every day | ORAL | Status: DC
  Administered 2022-08-01 – 2022-08-03 (×3): 5 mg via ORAL
  Filled 2022-07-31 (×3): qty 1

## 2022-07-31 MED ORDER — DOFETILIDE 500 MCG PO CAPS
500.0000 ug | ORAL_CAPSULE | Freq: Two times a day (BID) | ORAL | Status: DC
  Administered 2022-07-31 – 2022-08-03 (×4): 500 ug via ORAL
  Filled 2022-07-31 (×6): qty 1

## 2022-07-31 MED ORDER — MORPHINE SULFATE (PF) 2 MG/ML IV SOLN: 2.0000 mg | INTRAVENOUS | Status: DC | PRN

## 2022-07-31 MED ORDER — APIXABAN 5 MG PO TABS
5.0000 mg | ORAL_TABLET | Freq: Two times a day (BID) | ORAL | Status: DC
  Administered 2022-07-31 – 2022-08-03 (×6): 5 mg via ORAL
  Filled 2022-07-31 (×6): qty 1

## 2022-07-31 MED ORDER — ALBUTEROL SULFATE HFA 108 (90 BASE) MCG/ACT IN AERS: 2.0000 | INHALATION_SPRAY | Freq: Four times a day (QID) | RESPIRATORY_TRACT | Status: DC | PRN

## 2022-07-31 MED ORDER — SODIUM CHLORIDE 0.9 % IV SOLN
2.0000 g | Freq: Once | INTRAVENOUS | Status: AC
  Administered 2022-07-31: 2 g via INTRAVENOUS
  Filled 2022-07-31: qty 20

## 2022-07-31 MED ORDER — LACTATED RINGERS IV SOLN: INTRAVENOUS | Status: DC

## 2022-07-31 MED ORDER — SODIUM CHLORIDE 0.9 % IV SOLN: INTRAVENOUS | Status: AC

## 2022-07-31 MED ORDER — ACETAMINOPHEN 325 MG PO TABS: 650.0000 mg | ORAL_TABLET | Freq: Four times a day (QID) | ORAL | Status: DC | PRN

## 2022-07-31 MED ORDER — SODIUM CHLORIDE 0.9 % IV BOLUS (SEPSIS)
1000.0000 mL | Freq: Once | INTRAVENOUS | Status: AC
  Administered 2022-07-31: 1000 mL via INTRAVENOUS

## 2022-07-31 MED ORDER — ONDANSETRON HCL 4 MG/2ML IJ SOLN
4.0000 mg | Freq: Four times a day (QID) | INTRAMUSCULAR | Status: DC | PRN
  Administered 2022-08-01: 4 mg via INTRAVENOUS
  Filled 2022-07-31: qty 2

## 2022-07-31 MED ORDER — ACETAMINOPHEN 650 MG RE SUPP: 650.0000 mg | Freq: Four times a day (QID) | RECTAL | Status: DC | PRN

## 2022-07-31 MED ORDER — SODIUM CHLORIDE 0.9 % IV SOLN
1.0000 g | INTRAVENOUS | Status: DC
  Administered 2022-08-01 – 2022-08-02 (×2): 1 g via INTRAVENOUS
  Filled 2022-07-31 (×2): qty 10

## 2022-07-31 MED ORDER — METRONIDAZOLE 500 MG/100ML IV SOLN
500.0000 mg | Freq: Once | INTRAVENOUS | Status: AC
  Administered 2022-07-31: 500 mg via INTRAVENOUS
  Filled 2022-07-31: qty 100

## 2022-07-31 MED ORDER — FERROUS SULFATE 325 (65 FE) MG PO TABS
325.0000 mg | ORAL_TABLET | Freq: Every day | ORAL | Status: DC
  Administered 2022-08-01 – 2022-08-03 (×3): 325 mg via ORAL
  Filled 2022-07-31 (×3): qty 1

## 2022-07-31 MED ORDER — ALBUTEROL SULFATE (2.5 MG/3ML) 0.083% IN NEBU: 2.5000 mg | INHALATION_SOLUTION | Freq: Four times a day (QID) | RESPIRATORY_TRACT | Status: DC | PRN

## 2022-07-31 MED ORDER — ONDANSETRON HCL 4 MG PO TABS: 4.0000 mg | ORAL_TABLET | Freq: Four times a day (QID) | ORAL | Status: DC | PRN

## 2022-07-31 MED ORDER — PRAVASTATIN SODIUM 40 MG PO TABS
40.0000 mg | ORAL_TABLET | Freq: Every day | ORAL | Status: DC
  Administered 2022-08-01 – 2022-08-03 (×3): 40 mg via ORAL
  Filled 2022-07-31 (×3): qty 1

## 2022-07-31 MED ORDER — SODIUM CHLORIDE 0.9 % IV SOLN: INTRAVENOUS | Status: DC

## 2022-07-31 MED ORDER — ONDANSETRON HCL 4 MG/2ML IJ SOLN
4.0000 mg | Freq: Once | INTRAMUSCULAR | Status: AC
  Administered 2022-07-31: 4 mg via INTRAVENOUS
  Filled 2022-07-31: qty 2

## 2022-07-31 MED ORDER — MIRTAZAPINE 15 MG PO TABS
7.5000 mg | ORAL_TABLET | Freq: Every day | ORAL | Status: DC
  Administered 2022-07-31 – 2022-08-02 (×3): 7.5 mg via ORAL
  Filled 2022-07-31 (×3): qty 1

## 2022-07-31 MED ORDER — LISINOPRIL 10 MG PO TABS
5.0000 mg | ORAL_TABLET | Freq: Every day | ORAL | Status: DC
  Administered 2022-08-01 – 2022-08-03 (×3): 5 mg via ORAL
  Filled 2022-07-31 (×3): qty 1

## 2022-07-31 MED ORDER — GABAPENTIN 300 MG PO CAPS
300.0000 mg | ORAL_CAPSULE | Freq: Three times a day (TID) | ORAL | Status: DC
  Administered 2022-07-31 – 2022-08-03 (×8): 300 mg via ORAL
  Filled 2022-07-31 (×8): qty 1

## 2022-07-31 MED ORDER — OXYCODONE HCL 5 MG PO TABS: 5.0000 mg | ORAL_TABLET | ORAL | Status: DC | PRN

## 2022-07-31 NOTE — ED Triage Notes (Signed)
Pt states that has been running a fever since last night with N/V. Last tylenol was 11am today.

## 2022-07-31 NOTE — ED Notes (Signed)
ED Provider at bedside. 

## 2022-07-31 NOTE — ED Provider Notes (Signed)
[place] EMERGENCY DEPARTMENT AT [facility] Provider Note   CSN: [id number] Arrival date & time: 07/31/22  1931     History  Chief Complaint  Patient presents with   Fever    Theodore Mendoza is a 82 y.o. male.   Fever Associated symptoms: nausea and vomiting    This patient is an 82 year old male, history of Guillain-Barr syndrome, walks with bilateral foot braces, has history of hypertension on lisinopril, atrial fibrillation on Tikosyn and he is also on Eliquis, he presents with a fever nausea and vomiting without the ability to hold anything down over the last 24 hours.  This started last night.  He took Tylenol last night and again this morning at 11 AM.  No dysuria, he states he urinates very frequently, he is on a diuretic which keeps him in the bathroom according to his report.  Nobody has been sick around him.    Home Medications Prior to Admission medications   Medication Sig Start Date End Date Taking? Authorizing Provider  albuterol (VENTOLIN HFA) 108 (90 Base) MCG/ACT inhaler INHALE 2 PUFFS INTO THE LUNGS EVERY 6 HOURS AS NEEDED FOR WHEEZING OR SHORTNESS OF BREATH 07/14/21   Gwenlyn Fudge, FNP  amLODipine (NORVASC) 5 MG tablet Take 1 tablet (5 mg total) by mouth daily. 01/11/22   Mechele Claude, MD  apixaban (ELIQUIS) 5 MG TABS tablet TAKE ONE TABLET BY MOUTH TWICE DAILY 06/15/22   Mechele Claude, MD  dofetilide (TIKOSYN) 500 MCG capsule Take 1 capsule (500 mcg total) by mouth 2 (two) times daily. 07/16/22   Mechele Claude, MD  escitalopram (LEXAPRO) 20 MG tablet Take 1 tablet (20 mg total) by mouth daily. 01/11/22   Mechele Claude, MD  FEROSUL 325 (65 Fe) MG tablet TAKE 1 TABLET DAILY 10/12/21   Marguerita Merles, Reuel Boom, MD  furosemide (LASIX) 20 MG tablet Take 1 tablet (20 mg total) by mouth daily. 01/11/22   Mechele Claude, MD  gabapentin (NEURONTIN) 300 MG capsule Take 300 mg by mouth three times daily 09/29/21   Gwenlyn Fudge, FNP  lisinopril  (ZESTRIL) 5 MG tablet Take 1 tablet (5 mg total) by mouth daily. 01/11/22   Mechele Claude, MD  mirtazapine (REMERON) 7.5 MG tablet Take 1 tablet (7.5 mg total) by mouth at bedtime. 01/11/22   Mechele Claude, MD  pantoprazole (PROTONIX) 40 MG tablet Take 1 tablet (40 mg total) by mouth 2 (two) times daily. 01/11/22   Mechele Claude, MD  pravastatin (PRAVACHOL) 40 MG tablet Take 1 tablet (40 mg total) by mouth daily. 01/11/22   Mechele Claude, MD  sucralfate (CARAFATE) 1 g tablet Take 1 tablet (1 g total) by mouth 2 (two) times daily. 01/11/22   Mechele Claude, MD  tamsulosin (FLOMAX) 0.4 MG CAPS capsule Take 1 capsule (0.4 mg total) by mouth daily. 01/11/22   Mechele Claude, MD  vitamin B-12 (CYANOCOBALAMIN) 1000 MCG tablet Take 1 tablet (1,000 mcg total) by mouth daily. 12/18/19   Tyrone Nine, MD      Allergies    Hydrocodone-acetaminophen, Morphine and related, Other, and Valsartan    Review of Systems   Review of Systems  Constitutional:  Positive for fever.  Gastrointestinal:  Positive for nausea and vomiting.    Physical Exam Updated Vital Signs BP (!) 150/72   Pulse 70   Temp (!) 101.4 F (38.6 C) (Rectal)   Resp 18   Ht 1.727 m (5\' 8" )   Wt 68 kg   SpO2 100%  BMI 22.79 kg/m  Physical Exam Vitals and nursing note reviewed.  Constitutional:      General: He is not in acute distress.    Appearance: He is well-developed.  HENT:     Head: Normocephalic and atraumatic.     Nose: No congestion.     Mouth/Throat:     Mouth: Mucous membranes are dry.     Pharynx: No oropharyngeal exudate.  Eyes:     General: No scleral icterus.       Right eye: No discharge.        Left eye: No discharge.     Conjunctiva/sclera: Conjunctivae normal.     Pupils: Pupils are equal, round, and reactive to light.  Neck:     Thyroid: No thyromegaly.     Vascular: No JVD.  Cardiovascular:     Rate and Rhythm: Normal rate and regular rhythm.     Heart sounds: Normal heart sounds. No murmur  heard.    No friction rub. No gallop.  Pulmonary:     Effort: Pulmonary effort is normal. No respiratory distress.     Breath sounds: Normal breath sounds. No wheezing or rales.  Abdominal:     General: Bowel sounds are normal. There is no distension.     Palpations: Abdomen is soft. There is no mass.     Tenderness: There is no abdominal tenderness.  Musculoskeletal:        General: No tenderness. Normal range of motion.     Cervical back: Normal range of motion and neck supple.     Right lower leg: Edema present.     Left lower leg: Edema present.     Comments: Bilateral lower extremity edema, symmetrical, chronic  Lymphadenopathy:     Cervical: No cervical adenopathy.  Skin:    General: Skin is warm and dry.     Findings: No erythema or rash.  Neurological:     Mental Status: He is alert.     Coordination: Coordination normal.  Psychiatric:        Behavior: Behavior normal.     ED Results / Procedures / Treatments   Labs (all labs ordered are listed, but only abnormal results are displayed) Labs Reviewed  COMPREHENSIVE METABOLIC PANEL - Abnormal; Notable for the following components:      Result Value   Potassium 3.4 (*)    Glucose, Bld 122 (*)    Calcium 8.6 (*)    Total Bilirubin 1.3 (*)    All other components within normal limits  CBC WITH DIFFERENTIAL/PLATELET - Abnormal; Notable for the following components:   WBC 11.2 (*)    RBC 3.96 (*)    Hemoglobin 11.5 (*)    HCT 36.4 (*)    Platelets 138 (*)    Neutro Abs 10.1 (*)    Lymphs Abs 0.4 (*)    All other components within normal limits  PROTIME-INR - Abnormal; Notable for the following components:   Prothrombin Time 22.8 (*)    INR 2.0 (*)    All other components within normal limits  APTT - Abnormal; Notable for the following components:   aPTT 39 (*)    All other components within normal limits  URINALYSIS, W/ REFLEX TO CULTURE (INFECTION SUSPECTED) - Abnormal; Notable for the following components:    Color, Urine AMBER (*)    APPearance CLOUDY (*)    Hgb urine dipstick LARGE (*)    Protein, ur 100 (*)    Leukocytes,Ua LARGE (*)  Bacteria, UA MANY (*)    All other components within normal limits  CULTURE, BLOOD (ROUTINE X 2)  CULTURE, BLOOD (ROUTINE X 2)  RESP PANEL BY RT-PCR (RSV, FLU A&B, COVID)  RVPGX2  URINE CULTURE  LACTIC ACID, PLASMA  LIPASE, BLOOD  LACTIC ACID, PLASMA    EKG EKG Interpretation  Date/Time:  Tuesday July 31 2022 20:00:49 EDT Ventricular Rate:  69 PR Interval:  172 QRS Duration: 104 QT Interval:  400 QTC Calculation: 429 R Axis:   3 Text Interpretation: Sinus rhythm Abnormal R-wave progression, early transition Borderline T abnormalities, lateral leads Baseline wander in lead(s) V2 Confirmed by Eber Hong ([id number]) on 07/31/2022 8:07:57 PM  Radiology DG Chest Port 1 View  Result Date: 07/31/2022 CLINICAL DATA:  Fevers for 2 days, initial encounter EXAM: PORTABLE CHEST 1 VIEW COMPARISON:  08/12/2020 FINDINGS: Cardiac shadow is stable. Aortic calcifications are noted. The lungs are well aerated bilaterally. No focal infiltrate or effusion is seen. No bony abnormality is noted. IMPRESSION: No active disease. Electronically Signed   By: Alcide Clever M.D.   On: 07/31/2022 20:33    Procedures Procedures    Medications Ordered in ED Medications  lactated ringers infusion ( Intravenous New Bag/Given 07/31/22 2043)  0.9 %  sodium chloride infusion (has no administration in time range)  ondansetron (ZOFRAN) injection 4 mg (has no administration in time range)  sodium chloride 0.9 % bolus 1,000 mL (1,000 mLs Intravenous New Bag/Given 07/31/22 2033)  cefTRIAXone (ROCEPHIN) 2 g in sodium chloride 0.9 % 100 mL IVPB (0 g Intravenous Stopped 07/31/22 2047)  metroNIDAZOLE (FLAGYL) IVPB 500 mg (0 mg Intravenous Stopped 07/31/22 2131)    ED Course/ Medical Decision Making/ A&P                             Medical Decision Making Amount and/or Complexity of Data  Reviewed Labs: ordered. Radiology: ordered. ECG/medicine tests: ordered.  Risk Prescription drug management. Decision regarding hospitalization.   This patient presents to the ED for concern of fever nausea vomiting and weakness, this involves an extensive number of treatment options, and is a complaint that carries with it a high risk of complications and morbidity.  The differential diagnosis includes sepsis, infection, consider UTI, COVID, pneumonia, intra-abdominal problems, he does not have any scars to suggest that he has never had gallbladders appendix or other significant surgical procedures.  He does report having some type of aneurysm many years ago   Co morbidities that complicate the patient evaluation  Chronic Guillain-Barr syndrome, hypertension, elderly   Additional history obtained:  Additional history obtained from medical record External records from outside source obtained and reviewed including prior Guillain-Barr syndrome initially treated at [facility], multiple different tracheostomy had been placed in the past, he no longer needs a trach   Lab Tests:  I Ordered, and personally interpreted labs.  The pertinent results include: Leukocytosis of 11,200, lactate of 1.1, metabolic panel with preserved renal function and electrolytes, no significant anemia, INR of 2.0, lipase of 19, urinalysis with too numerous to count white blood cells and bacteria   Imaging Studies ordered:  I ordered imaging studies including chest x-ray I independently visualized and interpreted imaging which showed no acute findings I agree with the radiologist interpretation   Cardiac Monitoring: / EKG:  The patient was maintained on a cardiac monitor.  I personally viewed and interpreted the cardiac monitored which showed an underlying rhythm of: Normal sinus rhythm  Consultations Obtained:  I requested consultation with the hospitalist,  and discussed lab and imaging findings as well  as pertinent plan - they recommend: Admission   Problem List / ED Course / Critical interventions / Medication management  This is presents with signs and symptoms of sepsis, he has a fever of 101.4 with a source of infection in his urine and a leukocytosis of 11,200, he has been weak with nausea and vomiting and very little to eat or drink over the last 24 hours and feels very dehydrated. I ordered medication including antibiotics for infection Reevaluation of the patient after these medicines showed that the patient critically ill but improved I have reviewed the patients home medicines and have made adjustments as needed   Social Determinants of Health:  Will need to be admitted to the hospital, elderly   Test / Admission - Considered:  Admit to higher level of care         Final Clinical Impression(s) / ED Diagnoses Final diagnoses:  Acute cystitis with hematuria  Sepsis without acute organ dysfunction, due to unspecified organism [facility])     Eber Hong, MD 07/31/22 2140

## 2022-07-31 NOTE — ED Notes (Signed)
NT placed pt on O2 2 LNC due to low O2 sat of 88%. Pt's O2 now at 100%

## 2022-07-31 NOTE — Sepsis Progress Note (Signed)
Elink monitoring for the code sepsis protocol.  

## 2022-08-01 DIAGNOSIS — J9601 Acute respiratory failure with hypoxia: Secondary | ICD-10-CM | POA: Insufficient documentation

## 2022-08-01 DIAGNOSIS — E782 Mixed hyperlipidemia: Secondary | ICD-10-CM

## 2022-08-01 DIAGNOSIS — K219 Gastro-esophageal reflux disease without esophagitis: Secondary | ICD-10-CM | POA: Diagnosis not present

## 2022-08-01 DIAGNOSIS — A419 Sepsis, unspecified organism: Secondary | ICD-10-CM

## 2022-08-01 DIAGNOSIS — N3000 Acute cystitis without hematuria: Secondary | ICD-10-CM | POA: Diagnosis not present

## 2022-08-01 DIAGNOSIS — F411 Generalized anxiety disorder: Secondary | ICD-10-CM

## 2022-08-01 DIAGNOSIS — I10 Essential (primary) hypertension: Secondary | ICD-10-CM | POA: Diagnosis not present

## 2022-08-01 DIAGNOSIS — I4819 Other persistent atrial fibrillation: Secondary | ICD-10-CM

## 2022-08-01 DIAGNOSIS — R6520 Severe sepsis without septic shock: Secondary | ICD-10-CM

## 2022-08-01 LAB — COMPREHENSIVE METABOLIC PANEL
ALT: 9 U/L (ref 0–44)
AST: 12 U/L — ABNORMAL LOW (ref 15–41)
Albumin: 3.3 g/dL — ABNORMAL LOW (ref 3.5–5.0)
Alkaline Phosphatase: 53 U/L (ref 38–126)
Anion gap: 8 (ref 5–15)
BUN: 18 mg/dL (ref 8–23)
CO2: 28 mmol/L (ref 22–32)
Calcium: 8.1 mg/dL — ABNORMAL LOW (ref 8.9–10.3)
Chloride: 105 mmol/L (ref 98–111)
Creatinine, Ser: 0.88 mg/dL (ref 0.61–1.24)
GFR, Estimated: >60 mL/min (ref >60)
Glucose, Bld: 91 mg/dL (ref 70–99)
Potassium: 3.2 mmol/L — ABNORMAL LOW (ref 3.5–5.1)
Sodium: 141 mmol/L (ref 135–145)
Total Bilirubin: 0.8 mg/dL (ref 0.3–1.2)
Total Protein: 5.8 g/dL — ABNORMAL LOW (ref 6.5–8.1)

## 2022-08-01 LAB — CBC WITH DIFFERENTIAL/PLATELET
Abs Immature Granulocytes: 0.04 10*3/uL (ref 0.00–0.07)
Basophils Absolute: 0.1 10*3/uL (ref 0.0–0.1)
Basophils Relative: 1 %
Eosinophils Absolute: 0.1 10*3/uL (ref 0.0–0.5)
Eosinophils Relative: 1 %
HCT: 34.4 % — ABNORMAL LOW (ref 39.0–52.0)
Hemoglobin: 10.6 g/dL — ABNORMAL LOW (ref 13.0–17.0)
Immature Granulocytes: 0 %
Lymphocytes Relative: 6 %
Lymphs Abs: 0.5 10*3/uL — ABNORMAL LOW (ref 0.7–4.0)
MCH: 29 pg (ref 26.0–34.0)
MCHC: 30.8 g/dL (ref 30.0–36.0)
MCV: 94 fL (ref 80.0–100.0)
Monocytes Absolute: 0.6 10*3/uL (ref 0.1–1.0)
Monocytes Relative: 7 %
Neutro Abs: 7.7 10*3/uL (ref 1.7–7.7)
Neutrophils Relative %: 85 %
Platelets: 114 10*3/uL — ABNORMAL LOW (ref 150–400)
RBC: 3.66 MIL/uL — ABNORMAL LOW (ref 4.22–5.81)
RDW: 13.9 % (ref 11.5–15.5)
WBC: 9 10*3/uL (ref 4.0–10.5)
nRBC: 0 % (ref 0.0–0.2)

## 2022-08-01 LAB — MAGNESIUM: Magnesium: 1.7 mg/dL (ref 1.7–2.4)

## 2022-08-01 LAB — PROCALCITONIN: Procalcitonin: 0.45 ng/mL

## 2022-08-01 LAB — CULTURE, BLOOD (ROUTINE X 2)

## 2022-08-01 MED ORDER — POTASSIUM CHLORIDE CRYS ER 20 MEQ PO TBCR
40.0000 meq | EXTENDED_RELEASE_TABLET | Freq: Once | ORAL | Status: AC
  Administered 2022-08-01: 40 meq via ORAL
  Filled 2022-08-01: qty 2

## 2022-08-01 MED ORDER — MAGNESIUM SULFATE 4 GM/100ML IV SOLN
4.0000 g | Freq: Once | INTRAVENOUS | Status: AC
  Administered 2022-08-01: 4 g via INTRAVENOUS
  Filled 2022-08-01: qty 100

## 2022-08-01 NOTE — ED Notes (Signed)
Took pt off of O2, pt O2 sats dropped to 88%. Placed pt back on 2L [place]. O2 sats back to 96%

## 2022-08-01 NOTE — Assessment & Plan Note (Signed)
-  Febrile, tachypneic -end organ damage: Hypoxia -CXR shows no infiltrate -UA indicative of UTI, urine culture pending -blood culture pending -1 liter NS bolus -Continue IV fluids -Continue rocephin -Continue to monitor

## 2022-08-01 NOTE — Assessment & Plan Note (Signed)
-  Continue rocephin  -Urine culture pending

## 2022-08-01 NOTE — Progress Notes (Signed)
Pharmacy: Dofetilide (Tikosyn) - Follow Up Assessment and Electrolyte Replacement  Pharmacy consulted to assist in monitoring and replacing electrolytes in this 82 y.o. male admitted on 07/31/2022 on Tikosyn  Labs:    Component Value Date/Time   K 3.2 (L) 08/01/2022 0428   MG 1.7 08/01/2022 0428     Plan: Potassium: K < 3.5:  Give KCl 40 mEq q4hr x2   Magnesium: Mg 1.3-1.7: Give Mg 4 gm IV x1    Thank you for allowing pharmacy to participate in this patient's care   Judeth Cornfield, PharmD Clinical Pharmacist 08/01/2022 8:24 AM

## 2022-08-01 NOTE — Plan of Care (Signed)

## 2022-08-01 NOTE — Assessment & Plan Note (Signed)
-  continue norvasc, lisinopril

## 2022-08-01 NOTE — Progress Notes (Signed)
  Transition of Care [facility]) Screening Note   Patient Details  Name: Theodore Mendoza Date of Birth: 10-09-1940   Transition of Care [facility]) CM/SW Contact:    Villa Herb, LCSWA Phone Number: 08/01/2022, 11:34 AM    Transition of Care Department [facility]) has reviewed patient and no TOC needs have been identified at this time. We will continue to monitor patient advancement through interdisciplinary progression rounds. If new patient transition needs arise, please place a TOC consult.

## 2022-08-01 NOTE — Assessment & Plan Note (Signed)
Continue Tikosyn and eliquis

## 2022-08-01 NOTE — Progress Notes (Signed)
Theodore Mendoza is a 82 y.o. male with medical history significant of AAA, atrial fibrillation, brain tumor, CHF, GERD, Guillain-Barr, hypertension, and more presents the ED with a chief complaint of flulike symptoms.  Patient reports that he had myalgias, nausea, vomiting that started on Monday.  Patient patient was admitted with mild hypoxemia as well as UTI and started on Rocephin empirically with cultures pending.  He has been admitted after midnight and was seen and evaluated at bedside.  Continue current medications and follow cultures.  Anticipate discharge in the next 24 hours if further improved.  Monitor lab work.  Total care time: 25 minutes.

## 2022-08-01 NOTE — H&P (Signed)
History and Physical    Patient: Theodore Mendoza:[id number] DOB: 1941-03-09 DOA: 07/31/2022 DOS: the patient was seen and examined on 08/01/2022 PCP: Mechele Claude, MD  Patient coming from: Home  Chief Complaint:  Chief Complaint  Patient presents with   Fever   HPI: ZAKRY CASO is a 82 y.o. male with medical history significant of AAA, atrial fibrillation, brain tumor, CHF, GERD, Guillain-Barr, hypertension, and more presents the ED with a chief complaint of flulike symptoms.  Patient reports that he had myalgias, nausea, vomiting that started on Monday.  He reports his nausea and was constant but he only vomited twice.  It was nonbloody emesis.  He had no diarrhea, no hematuria, no dysuria, no cough.  Patient reports that he did not feel short of breath and did not have palpitations.  Patient reports he has [employer] and uses a wheelchair.  He lives at home with his wife.  He really just wants to go home.  After fluids and antibiotics in the ER he is feeling much better.  Patient does not smoke, does not drink.  He cannot get vaccines because of Guillain-Barr.  Patient is full code. Review of Systems: As mentioned in the history of present illness. All other systems reviewed and are negative. Past Medical History:  Diagnosis Date   AAA (abdominal aortic aneurysm) (HCC)    Atrial fibrillation (HCC)    Brain tumor (HCC)    x2 (benign per pt)   Cancer (HCC)    melanoma removed twice (head and right forearm)   Cardiac arrhythmia    CHF (congestive heart failure) (HCC)    GERD (gastroesophageal reflux disease)    Guillain-Barre syndrome (HCC) 02/13/1986   Headache    Hypertension    Kidney stones    Myocardial infarction [facility]) 2007   Past Surgical History:  Procedure Laterality Date   ABDOMINAL AORTIC ENDOVASCULAR STENT GRAFT Bilateral 04/04/2022   Procedure: ABDOMINAL AORTIC ENDOVASCULAR STENT GRAFT;  Surgeon: Nada Libman, MD;  Location: [facility] OR;  Service: Vascular;   Laterality: Bilateral;   COLONOSCOPY WITH PROPOFOL N/A 08/14/2020   Procedure: COLONOSCOPY WITH PROPOFOL;  Surgeon: Malissa Hippo, MD;  Location: [facility];  Service: Endoscopy;  Laterality: N/A;   CORONARY ANGIOPLASTY  1997   after mi   CYSTOSCOPY WITH RETROGRADE PYELOGRAM, URETEROSCOPY AND STENT PLACEMENT Left 06/28/2014   Procedure: 1ST STAGE CYSTOSCOPY/URETEROSCOPY/STENT PLACEMENT;  Surgeon: Sebastian Ache, MD;  Location: [facility];  Service: Urology;  Laterality: Left;   CYSTOSCOPY WITH STENT PLACEMENT Left 05/08/2014   Procedure: CYSTOSCOPY, RETROGRADE PYELOGRAM WITH LEFT URETERAL STENT PLACEMENT;  Surgeon: Sebastian Ache, MD;  Location: [facility];  Service: Urology;  Laterality: Left;   ENTEROSCOPY N/A 09/16/2020   Procedure: PUSH ENTEROSCOPY;  Surgeon: Dolores Frame, MD;  Location: [facility];  Service: Gastroenterology;  Laterality: N/A;   ENTEROSCOPY N/A 10/25/2020   Procedure: PUSH ENTEROSCOPY;  Surgeon: Dolores Frame, MD;  [facility]: [facility];  Service: Gastroenterology;  Laterality: N/A;   ESOPHAGOGASTRODUODENOSCOPY (EGD) WITH PROPOFOL N/A 12/16/2019   Procedure: ESOPHAGOGASTRODUODENOSCOPY (EGD) WITH PROPOFOL;  Surgeon: Napoleon Form, MD;  Location: [facility];  Service: Endoscopy;  Laterality: N/A;   ESOPHAGOGASTRODUODENOSCOPY (EGD) WITH PROPOFOL N/A 08/13/2020   Procedure: ESOPHAGOGASTRODUODENOSCOPY (EGD) WITH PROPOFOL;  Surgeon: Malissa Hippo, MD;  [facility];  Service: Endoscopy;  Laterality: N/A;   ESOPHAGOGASTRODUODENOSCOPY (EGD) WITH PROPOFOL N/A 09/16/2020   Procedure: ESOPHAGOGASTRODUODENOSCOPY (EGD) WITH PROPOFOL;  Surgeon: Dolores Frame, MD;  [facility];  Service: Gastroenterology;  Laterality: N/A;  1:55   ESOPHAGOGASTRODUODENOSCOPY (EGD) WITH PROPOFOL N/A 10/25/2020   Procedure: ESOPHAGOGASTRODUODENOSCOPY (EGD) WITH PROPOFOL;  Surgeon: Dolores Frame, MD;  [facility];  Service:  Gastroenterology;  Laterality: N/A;  2:00   EYE SURGERY Right may 2015   growth removed, july 2015left eye cataract removed, right eye catarct removed   gamma kniferadiation treatment  Feb 09 2014   [facility]   GIVENS CAPSULE STUDY N/A 10/11/2020   Procedure: GIVENS CAPSULE STUDY;  Surgeon: Dolores Frame, MD;  [facility];  Service: Gastroenterology;  Laterality: N/A;  7:30   HEMOSTASIS CLIP PLACEMENT  12/16/2019   Procedure: HEMOSTASIS CLIP PLACEMENT;  Surgeon: Napoleon Form, MD;  Location: [facility];  Service: Endoscopy;;   HEMOSTASIS CONTROL  12/16/2019   Procedure: HEMOSTASIS CONTROL;  Surgeon: Napoleon Form, MD;  Location: [facility];  Service: Endoscopy;;  Endoloop   HOLMIUM LASER APPLICATION Left 06/28/2014   Procedure: HOLMIUM LASER APPLICATION;  Surgeon: Sebastian Ache, MD;  Location: [facility];  Service: Urology;  Laterality: Left;   LITHOTRIPSY  years ago   POLYPECTOMY  12/16/2019   Procedure: POLYPECTOMY;  Surgeon: Napoleon Form, MD;  Location: [facility];  Service: Endoscopy;;   STONE EXTRACTION WITH BASKET Left 06/28/2014   Procedure: STONE EXTRACTION WITH BASKET;  Surgeon: Sebastian Ache, MD;  Location: [facility];  Service: Urology;  Laterality: Left;   Social History:  reports that he has never smoked. He has never been exposed to tobacco smoke. He has never used smokeless tobacco. He reports that he does not drink alcohol and does not use drugs.  Allergies  Allergen Reactions   Valsartan Rash   Morphine And Related Other (See Comments)    Reaction:  Confusion/weakness/hallucinations    Family History  Problem Relation Age of Onset   Diabetes Mother    Lung cancer Mother    CAD Father    Diabetes Brother    Diabetes Sister    Stroke Sister    Other Maternal Grandfather        brain tumor   Colon cancer Neg Hx    Pancreatic cancer Neg Hx    Esophageal cancer Neg Hx     Prior to Admission medications    Medication Sig Start Date End Date Taking? Authorizing Provider  albuterol (VENTOLIN HFA) 108 (90 Base) MCG/ACT inhaler INHALE 2 PUFFS INTO THE LUNGS EVERY 6 HOURS AS NEEDED FOR WHEEZING OR SHORTNESS OF BREATH 07/14/21   Gwenlyn Fudge, FNP  amLODipine (NORVASC) 5 MG tablet Take 1 tablet (5 mg total) by mouth daily. 01/11/22   Mechele Claude, MD  apixaban (ELIQUIS) 5 MG TABS tablet TAKE ONE TABLET BY MOUTH TWICE DAILY 06/15/22   Mechele Claude, MD  dofetilide (TIKOSYN) 500 MCG capsule Take 1 capsule (500 mcg total) by mouth 2 (two) times daily. 07/16/22   Mechele Claude, MD  escitalopram (LEXAPRO) 20 MG tablet Take 1 tablet (20 mg total) by mouth daily. 01/11/22   Mechele Claude, MD  FEROSUL 325 (65 Fe) MG tablet TAKE 1 TABLET DAILY 10/12/21   Marguerita Merles, Reuel Boom, MD  furosemide (LASIX) 20 MG tablet Take 1 tablet (20 mg total) by mouth daily. 01/11/22   Mechele Claude, MD  gabapentin (NEURONTIN) 300 MG capsule Take 300 mg by mouth three times daily 09/29/21   Gwenlyn Fudge, FNP  lisinopril (ZESTRIL) 5 MG tablet Take 1 tablet (5 mg total)  by mouth daily. 01/11/22   Mechele Claude, MD  mirtazapine (REMERON) 7.5 MG tablet Take 1 tablet (7.5 mg total) by mouth at bedtime. 01/11/22   Mechele Claude, MD  pantoprazole (PROTONIX) 40 MG tablet Take 1 tablet (40 mg total) by mouth 2 (two) times daily. 01/11/22   Mechele Claude, MD  pravastatin (PRAVACHOL) 40 MG tablet Take 1 tablet (40 mg total) by mouth daily. 01/11/22   Mechele Claude, MD  sucralfate (CARAFATE) 1 g tablet Take 1 tablet (1 g total) by mouth 2 (two) times daily. 01/11/22   Mechele Claude, MD  tamsulosin (FLOMAX) 0.4 MG CAPS capsule Take 1 capsule (0.4 mg total) by mouth daily. 01/11/22   Mechele Claude, MD  vitamin B-12 (CYANOCOBALAMIN) 1000 MCG tablet Take 1 tablet (1,000 mcg total) by mouth daily. 12/18/19   Tyrone Nine, MD    Physical Exam: Vitals:   08/01/22 0145 08/01/22 0212 08/01/22 0400 08/01/22 0515  BP:  (!) 161/64  (!)  157/68  Pulse: 72 73  81  Resp: (!) 24 18 16 20   Temp:  98.8 F (37.1 C)  98.7 F (37.1 C)  TempSrc:      SpO2: 100% 98%  94%  Weight:      Height:       1.  General: Patient lying supine in bed,  no acute distress   2. Psychiatric: Alert and oriented x 3, mood and behavior normal for situation, pleasant and cooperative with exam   3. Neurologic: Speech and language are normal, face is symmetric, at baseline without acute deficits on limited exam   4. HEENMT:  Head is atraumatic, normocephalic, pupils reactive to light, neck is supple, trachea is midline, mucous membranes are moist   5. Respiratory : Lungs are clear to auscultation bilaterally without wheezing, rhonchi, rales, no cyanosis, no increase in work of breathing or accessory muscle use   6. Cardiovascular : Heart rate normal, rhythm is regular, rubs or gallops, no peripheral edema, peripheral pulses palpated   7. Gastrointestinal:  Abdomen is soft, nondistended, nontender to palpation bowel sounds active, no masses or organomegaly palpated   8. Skin:  Skin is warm, dry and intact without rashes, acute lesions, or ulcers on limited exam   9.Musculoskeletal:  No acute deformities or trauma, no asymmetry in tone, no peripheral edema, peripheral pulses palpated, no tenderness to palpation in the extremities  Data Reviewed: In the ED Temp 98.8-101.4, heart rate 60-71, respiratory rate 18-25, blood pressure 117/58-150/72, satting 87-100% Borderline leukocytosis with white blood cell count of 11.2, hemoglobin 11.5, platelets 138 Chemistry unremarkable UA indicative of UTI Blood culture and urine culture pending Chest x-ray shows no evidence of infiltrate Patient was started on Rocephin, Flagyl he was given 1 L bolus and started on IV fluids Admission requested for UTI, mild sepsis  Assessment and Plan: * UTI (urinary tract infection) -Continue rocephin  -Urine culture pending  Acute hypoxic respiratory failure  (HCC) -O2 sats down to 87% -2/2 sepsis? -Asymptomatic -Continue albuterol PRN -Wean off O2 as tolerated -Continue to monitor  Gastroesophageal reflux disease -Continue protonix  Persistent atrial fibrillation (HCC) -Continue Tikosyn and eliquis  Hyperlipidemia -Continue pravastatin   Essential (primary) hypertension -continue norvasc, lisinopril  Sepsis (HCC) -Febrile, tachypneic -end organ damage: Hypoxia -CXR shows no infiltrate -UA indicative of UTI, urine culture pending -blood culture pending -1 liter NS bolus -Continue IV fluids -Continue rocephin -Continue to monitor  Generalized anxiety disorder-resolved as of 08/01/2022 -      Advance  Care Planning:   Code Status: Full Code  Consults: None at this time  Family Communication: No family at bedside  Severity of Illness: The appropriate patient status for this patient is OBSERVATION. Observation status is judged to be reasonable and necessary in order to provide the required intensity of service to ensure the patient's safety. The patient's presenting symptoms, physical exam findings, and initial radiographic and laboratory data in the context of their medical condition is felt to place them at decreased risk for further clinical deterioration. Furthermore, it is anticipated that the patient will be medically stable for discharge from the hospital within 2 midnights of admission.   Author: Lilyan Gilford, DO 08/01/2022 5:50 AM  For on call review [email or web address].

## 2022-08-01 NOTE — Assessment & Plan Note (Signed)
Continue protonix  

## 2022-08-01 NOTE — Assessment & Plan Note (Signed)
-  O2 sats down to 87% -2/2 sepsis? -Asymptomatic -Continue albuterol PRN -Wean off O2 as tolerated -Continue to monitor

## 2022-08-01 NOTE — Assessment & Plan Note (Signed)
Continue pravastatin 

## 2022-08-01 NOTE — ED Notes (Signed)
Pt is feeling nauseated. Pt is asking for something to help with nausea. See mar

## 2022-08-01 NOTE — ED Notes (Signed)
ED TO INPATIENT HANDOFF REPORT  ED Nurse Name and Phone #: Efraim Kaufmann, RN  S Name/Age/Gender Theodore Mendoza 82 y.o. male Room/Bed: APA14/APA14  Code Status   Code Status: Full Code  Home/SNF/Other Home Patient oriented to: self, place, time, and situation Is this baseline? Yes   Triage Complete: Triage complete  Chief Complaint UTI (urinary tract infection) [N39.0]  Triage Note Pt states that has been running a fever since last night with N/V. Last tylenol was 11am today.    Allergies Allergies  Allergen Reactions   Morphine And Related Other (See Comments)    Reaction:  Confusion/weakness/hallucinations   Valsartan Rash    Level of Care/Admitting Diagnosis ED Disposition     ED Disposition  Admit   Condition  --   Comment  Hospital Area: [facility] [[id number]]  Level of Care: Telemetry [5]  Covid Evaluation: Asymptomatic - no recent exposure (last 10 days) testing not required  Diagnosis: UTI (urinary tract infection) [161096]  Admitting Physician: Lilyan Gilford [[id number]]  Attending Physician: Lilyan Gilford [[id number]]          B Medical/Surgery History Past Medical History:  Diagnosis Date   AAA (abdominal aortic aneurysm) (HCC)    Atrial fibrillation (HCC)    Brain tumor (HCC)    x2 (benign per pt)   Cancer (HCC)    melanoma removed twice (head and right forearm)   Cardiac arrhythmia    CHF (congestive heart failure) (HCC)    GERD (gastroesophageal reflux disease)    Guillain-Barre syndrome (HCC) 02/13/1986   Headache    Hypertension    Kidney stones    Myocardial infarction [facility]) 2007   Past Surgical History:  Procedure Laterality Date   ABDOMINAL AORTIC ENDOVASCULAR STENT GRAFT Bilateral 04/04/2022   Procedure: ABDOMINAL AORTIC ENDOVASCULAR STENT GRAFT;  Surgeon: Nada Libman, MD;  Location: [facility] OR;  Service: Vascular;  Laterality: Bilateral;   COLONOSCOPY WITH PROPOFOL N/A 08/14/2020   Procedure: COLONOSCOPY WITH  PROPOFOL;  Surgeon: Malissa Hippo, MD;  Location[facility];  Service: Endoscopy;  Laterality: N/A;   CORONARY ANGIOPLASTY  1997   after mi   CYSTOSCOPY WITH RETROGRADE PYELOGRAM, URETEROSCOPY AND STENT PLACEMENT Left 06/28/2014   Procedure: 1ST STAGE CYSTOSCOPY/URETEROSCOPY/STENT PLACEMENT;  Surgeon: Sebastian Ache, MD;  Location: [facility] ORS;  Service: Urology;  Laterality: Left;   CYSTOSCOPY WITH STENT PLACEMENT Left 05/08/2014   Procedure: CYSTOSCOPY, RETROGRADE PYELOGRAM WITH LEFT URETERAL STENT PLACEMENT;  Surgeon: Sebastian Ache, MD;  Location: [facility];  Service: Urology;  Laterality: Left;   ENTEROSCOPY N/A 09/16/2020   Procedure: PUSH ENTEROSCOPY;  Surgeon: Dolores Frame, MD;  [facility];  Service: Gastroenterology;  Laterality: N/A;   ENTEROSCOPY N/A 10/25/2020   Procedure: PUSH ENTEROSCOPY;  Surgeon: Dolores Frame, MD;  [facility];  Service: Gastroenterology;  Laterality: N/A;   ESOPHAGOGASTRODUODENOSCOPY (EGD) WITH PROPOFOL N/A 12/16/2019   Procedure: ESOPHAGOGASTRODUODENOSCOPY (EGD) WITH PROPOFOL;  Surgeon: Napoleon Form, MD;  [facility];  Service: Endoscopy;  Laterality: N/A;   ESOPHAGOGASTRODUODENOSCOPY (EGD) WITH PROPOFOL N/A 08/13/2020   Procedure: ESOPHAGOGASTRODUODENOSCOPY (EGD) WITH PROPOFOL;  Surgeon: Malissa Hippo, MD;  [facility];  Service: Endoscopy;  Laterality: N/A;   ESOPHAGOGASTRODUODENOSCOPY (EGD) WITH PROPOFOL N/A 09/16/2020   Procedure: ESOPHAGOGASTRODUODENOSCOPY (EGD) WITH PROPOFOL;  Surgeon: Dolores Frame, MD;  [facility];  Service: Gastroenterology;  Laterality: N/A;  1:55   ESOPHAGOGASTRODUODENOSCOPY (EGD) WITH PROPOFOL N/A 10/25/2020   Procedure: ESOPHAGOGASTRODUODENOSCOPY (  EGD) WITH PROPOFOL;  Surgeon: Marguerita Merles, Reuel Boom, MD;  [facility];  Service: Gastroenterology;  Laterality: N/A;  2:00   EYE SURGERY Right may 2015   growth removed, july  2015left eye cataract removed, right eye catarct removed   gamma kniferadiation treatment  Feb 09 2014   [facility]   GIVENS CAPSULE STUDY N/A 10/11/2020   Procedure: GIVENS CAPSULE STUDY;  Surgeon: Dolores Frame, MD;  [facility];  Service: Gastroenterology;  Laterality: N/A;  7:30   HEMOSTASIS CLIP PLACEMENT  12/16/2019   Procedure: HEMOSTASIS CLIP PLACEMENT;  Surgeon: Napoleon Form, MD;  [facility];  Service: Endoscopy;;   HEMOSTASIS CONTROL  12/16/2019   Procedure: HEMOSTASIS CONTROL;  Surgeon: Napoleon Form, MD;  [facility];  Service: Endoscopy;;  Endoloop   HOLMIUM LASER APPLICATION Left 06/28/2014   Procedure: HOLMIUM LASER APPLICATION;  Surgeon: Sebastian Ache, MD;  Location[facility];  Service: Urology;  Laterality: Left;   LITHOTRIPSY  years ago   POLYPECTOMY  12/16/2019   Procedure: POLYPECTOMY;  Surgeon: Napoleon Form, MD;  [facility];  Service: Endoscopy;;   STONE EXTRACTION WITH BASKET Left 06/28/2014   Procedure: STONE EXTRACTION WITH BASKET;  Surgeon: Sebastian Ache, MD;  Location[facility];  Service: Urology;  Laterality: Left;     A IV Location/Drains/Wounds Patient Lines/Drains/Airways Status     Active Line/Drains/Airways     Name Placement date Placement time Site Days   Peripheral IV 07/31/22 20 G Anterior;Right;Upper Arm 07/31/22  2015  Arm  1   Peripheral IV 07/31/22 20 G Anterior;Left Forearm 07/31/22  2033  Forearm  1   Ureteral Drain/Stent Left ureter 6 Fr. 06/28/14  1651  Left ureter  2956   Pressure Injury 04/04/22 Heel Left Stage 1 -  Intact skin with non-blanchable redness of a localized area usually over a bony prominence. Pink, non-blanching. 04/04/22  1423  -- 119            Intake/Output Last 24 hours  Intake/Output Summary (Last 24 hours) at 08/01/2022 0106 Last data filed at 07/31/2022 2153 Gross per 24 hour  Intake 1200 ml  Output --  Net 1200 ml     Labs/Imaging Results for orders placed or performed during the hospital encounter of 07/31/22 (from the past 48 hour(s))  Comprehensive metabolic panel     Status: Abnormal   Collection Time: 07/31/22  8:24 PM  Result Value Ref Range   Sodium 139 135 - 145 mmol/L   Potassium 3.4 (L) 3.5 - 5.1 mmol/L   Chloride 100 98 - 111 mmol/L   CO2 31 22 - 32 mmol/L   Glucose, Bld 122 (H) 70 - 99 mg/dL    Comment: Glucose reference range applies only to samples taken after fasting for at least 8 hours.   BUN 19 8 - 23 mg/dL   Creatinine, Ser 1.61 0.61 - 1.24 mg/dL   Calcium 8.6 (L) 8.9 - 10.3 mg/dL   Total Protein 6.5 6.5 - 8.1 g/dL   Albumin 3.7 3.5 - 5.0 g/dL   AST 16 15 - 41 U/L   ALT 9 0 - 44 U/L   Alkaline Phosphatase 62 38 - 126 U/L   Total Bilirubin 1.3 (H) 0.3 - 1.2 mg/dL   GFR, Estimated >09 >60 mL/min    Comment: (NOTE) Calculated using the CKD-EPI Creatinine Equation (2021)    Anion gap 8 5 - 15    Comment: Performed at  [facility], [address] [place], [place] [address]  CBC with Differential     Status: Abnormal   Collection Time: 07/31/22  8:24 PM  Result Value Ref Range   WBC 11.2 (H) 4.0 - 10.5 K/uL   RBC 3.96 (L) 4.22 - 5.81 MIL/uL   Hemoglobin 11.5 (L) 13.0 - 17.0 g/dL   HCT 60.4 (L) 54.0 - 98.1 %   MCV 91.9 80.0 - 100.0 fL   MCH 29.0 26.0 - 34.0 pg   MCHC 31.6 30.0 - 36.0 g/dL   RDW 19.1 47.8 - 29.5 %   Platelets 138 (L) 150 - 400 K/uL   nRBC 0.0 0.0 - 0.2 %   Neutrophils Relative % 91 %   Neutro Abs 10.1 (H) 1.7 - 7.7 K/uL   Lymphocytes Relative 3 %   Lymphs Abs 0.4 (L) 0.7 - 4.0 K/uL   Monocytes Relative 6 %   Monocytes Absolute 0.6 0.1 - 1.0 K/uL   Eosinophils Relative 0 %   Eosinophils Absolute 0.0 0.0 - 0.5 K/uL   Basophils Relative 0 %   Basophils Absolute 0.1 0.0 - 0.1 K/uL   Immature Granulocytes 0 %   Abs Immature Granulocytes 0.05 0.00 - 0.07 K/uL    Comment: Performed at [facility], [address] [place], [place] [address]  Protime-INR      Status: Abnormal   Collection Time: 07/31/22  8:24 PM  Result Value Ref Range   Prothrombin Time 22.8 (H) 11.4 - 15.2 seconds   INR 2.0 (H) 0.8 - 1.2    Comment: (NOTE) INR goal varies based on device and disease states. Performed at [facility], [address] [place], [place] [address]   APTT     Status: Abnormal   Collection Time: 07/31/22  8:24 PM  Result Value Ref Range   aPTT 39 (H) 24 - 36 seconds    Comment:        IF BASELINE aPTT IS ELEVATED, SUGGEST PATIENT RISK ASSESSMENT BE USED TO DETERMINE APPROPRIATE ANTICOAGULANT THERAPY. Performed at [facility], [address] [place], [place] [address]   Lipase, blood     Status: None   Collection Time: 07/31/22  8:24 PM  Result Value Ref Range   Lipase 19 11 - 51 U/L    Comment: Performed at [facility], [address], [place], [place] [address]  Lactic acid, plasma     Status: None   Collection Time: 07/31/22  8:25 PM  Result Value Ref Range   Lactic Acid, Venous 1.1 0.5 - 1.9 mmol/L    Comment: Performed at [facility], [address], [place], [place] [address]  Blood Culture (routine x 2)     Status: None (Preliminary result)   Collection Time: 07/31/22  8:25 PM   Specimen: Blood  Result Value Ref Range   Specimen Description BLOOD RIGHT ARM    Special Requests      BOTTLES DRAWN AEROBIC AND ANAEROBIC Blood Culture results may not be optimal due to an excessive volume of blood received in culture bottles Performed at [facility], [address] [place], [place] [address]    Culture PENDING    Report Status PENDING   Urinalysis, w/ Reflex to Culture (Infection Suspected) -Urine, Clean Catch     Status: Abnormal   Collection Time: 07/31/22  8:39 PM  Result Value Ref Range   Specimen Source URINE, CLEAN CATCH    Color, Urine AMBER (A) YELLOW    Comment: BIOCHEMICALS MAY BE  AFFECTED BY COLOR   APPearance CLOUDY (A) CLEAR   Specific Gravity, Urine 1.018 1.005 - 1.030   pH 5.0 5.0 - 8.0   Glucose, UA  NEGATIVE NEGATIVE mg/dL   Hgb urine dipstick LARGE (A) NEGATIVE   Bilirubin Urine NEGATIVE NEGATIVE   Ketones, ur NEGATIVE NEGATIVE mg/dL   Protein, ur 782 (A) NEGATIVE mg/dL   Nitrite NEGATIVE NEGATIVE   Leukocytes,Ua LARGE (A) NEGATIVE   RBC / HPF >50 0 - 5 RBC/hpf   WBC, UA >50 0 - 5 WBC/hpf    Comment:        Reflex urine culture not performed if WBC <=10, OR if Squamous epithelial cells >5. If Squamous epithelial cells >5 suggest recollection.    Bacteria, UA MANY (A) NONE SEEN   Squamous Epithelial / HPF 0-5 0 - 5 /HPF   WBC Clumps PRESENT    Mucus PRESENT     Comment: Performed at [facility], [address] [place], [place] [address]  Resp panel by RT-PCR (RSV, Flu A&B, Covid) Anterior Nasal Swab     Status: None   Collection Time: 07/31/22  8:40 PM   Specimen: Anterior Nasal Swab  Result Value Ref Range   SARS Coronavirus 2 by RT PCR NEGATIVE NEGATIVE    Comment: (NOTE) SARS-CoV-2 target nucleic acids are NOT DETECTED.  The SARS-CoV-2 RNA is generally detectable in upper respiratory specimens during the acute phase of infection. The lowest concentration of SARS-CoV-2 viral copies this assay can detect is 138 copies/mL. A negative result does not preclude SARS-Cov-2 infection and should not be used as the sole basis for treatment or other patient management decisions. A negative result may occur with  improper specimen collection/handling, submission of specimen other than nasopharyngeal swab, presence of viral mutation(s) within the areas targeted by this assay, and inadequate number of viral copies(<138 copies/mL). A negative result must be combined with clinical observations, patient history, and epidemiological information. The expected result is Negative.  Fact Sheet for Patients:  BloggerCourse.com  Fact Sheet for Healthcare Providers:  SeriousBroker.it  This test is no t yet approved or cleared by the  [place] FDA and  has been authorized for detection and/or diagnosis of SARS-CoV-2 by FDA under an Emergency Use Authorization (EUA). This EUA will remain  in effect (meaning this test can be used) for the duration of the COVID-19 declaration under Section 564(b)(1) of the Act, 21 U.S.C.section 360bbb-3(b)(1), unless the authorization is terminated  or revoked sooner.       Influenza A by PCR NEGATIVE NEGATIVE   Influenza B by PCR NEGATIVE NEGATIVE    Comment: (NOTE) The Xpert Xpress SARS-CoV-2/FLU/RSV plus assay is intended as an aid in the diagnosis of influenza from Nasopharyngeal swab specimens and should not be used as a sole basis for treatment. Nasal washings and aspirates are unacceptable for Xpert Xpress SARS-CoV-2/FLU/RSV testing.  Fact Sheet for Patients: BloggerCourse.com  Fact Sheet for Healthcare Providers: SeriousBroker.it  This test is not yet approved or cleared by the [place] FDA and has been authorized for detection and/or diagnosis of SARS-CoV-2 by FDA under an Emergency Use Authorization (EUA). This EUA will remain in effect (meaning this test can be used) for the duration of the COVID-19 declaration under Section 564(b)(1) of the Act, 21 U.S.C. section 360bbb-3(b)(1), unless the authorization is terminated or revoked.     Resp Syncytial Virus by PCR NEGATIVE NEGATIVE    Comment: (NOTE) Fact Sheet for Patients: BloggerCourse.com  Fact Sheet for Healthcare  Providers: SeriousBroker.it  This test is not yet approved or cleared by the [place] and has been authorized for detection and/or diagnosis of SARS-CoV-2 by FDA under an Emergency Use Authorization (EUA). This EUA will remain in effect (meaning this test can be used) for the duration of the COVID-19 declaration under Section 564(b)(1) of the Act, 21 U.S.C. section 360bbb-3(b)(1),  unless the authorization is terminated or revoked.  Performed at [facility], [address], [place], [place] [address]   Blood Culture (routine x 2)     Status: None (Preliminary result)   Collection Time: 07/31/22  8:40 PM   Specimen: Blood  Result Value Ref Range   Specimen Description BLOOD LEFT WRIST    Special Requests      BOTTLES DRAWN AEROBIC AND ANAEROBIC Blood Culture results may not be optimal due to an excessive volume of blood received in culture bottles Performed at [facility], [address], [place], [place] [address]    Culture PENDING    Report Status PENDING    DG Chest Port 1 View  Result Date: 07/31/2022 CLINICAL DATA:  Fevers for 2 days, initial encounter EXAM: PORTABLE CHEST 1 VIEW COMPARISON:  08/12/2020 FINDINGS: Cardiac shadow is stable. Aortic calcifications are noted. The lungs are well aerated bilaterally. No focal infiltrate or effusion is seen. No bony abnormality is noted. IMPRESSION: No active disease. Electronically Signed   By: Alcide Clever M.D.   On: 07/31/2022 20:33    Pending Labs Unresulted Labs (From admission, onward)     Start     Ordered   08/01/22 0500  Procalcitonin  Tomorrow morning,   R       References:    Procalcitonin Lower Respiratory Tract Infection AND Sepsis Procalcitonin Algorithm   07/31/22 2227   08/01/22 0500  Comprehensive metabolic panel  Tomorrow morning,   R        07/31/22 2227   08/01/22 0500  Magnesium  Tomorrow morning,   R        07/31/22 2227   08/01/22 0500  CBC with Differential/Platelet  Tomorrow morning,   R        07/31/22 2227   07/31/22 2039  Urine Culture  Once,   R        07/31/22 2039   07/31/22 2001  Lactic acid, plasma  (Septic presentation on arrival (screening labs, nursing and treatment orders for obvious sepsis))  STAT Now then every 2 hours,   R      07/31/22 2001            Vitals/Pain Today's Vitals   07/31/22 2315 07/31/22 2330 08/01/22 0100 08/01/22 0100  BP:  (!) 140/68 (!)  147/69   Pulse: 72 69 68   Resp: 19 18 20    Temp:    100 F (37.8 C)  TempSrc:    Rectal  SpO2: 97% 97% 98%   Weight:      Height:      PainSc:        Isolation Precautions No active isolations  Medications Medications  0.9 %  sodium chloride infusion ( Intravenous Rate/Dose Change 07/31/22 2236)  amLODipine (NORVASC) tablet 5 mg (has no administration in time range)  dofetilide (TIKOSYN) capsule 500 mcg (500 mcg Oral Given 07/31/22 2240)  lisinopril (ZESTRIL) tablet 5 mg (has no administration in time range)  pravastatin (PRAVACHOL) tablet 40 mg (has no administration in time range)  escitalopram (LEXAPRO) tablet 20 mg (has no administration in time  range)  mirtazapine (REMERON) tablet 7.5 mg (7.5 mg Oral Given 07/31/22 2239)  pantoprazole (PROTONIX) EC tablet 40 mg (40 mg Oral Given 07/31/22 2239)  apixaban (ELIQUIS) tablet 5 mg (5 mg Oral Given 07/31/22 2238)  ferrous sulfate tablet 325 mg (has no administration in time range)  gabapentin (NEURONTIN) capsule 300 mg (300 mg Oral Given 07/31/22 2239)  acetaminophen (TYLENOL) tablet 650 mg (has no administration in time range)    Or  acetaminophen (TYLENOL) suppository 650 mg (has no administration in time range)  oxyCODONE (Oxy IR/ROXICODONE) immediate release tablet 5 mg (has no administration in time range)  morphine (PF) 2 MG/ML injection 2 mg (has no administration in time range)  ondansetron (ZOFRAN) tablet 4 mg (has no administration in time range)    Or  ondansetron (ZOFRAN) injection 4 mg (has no administration in time range)  albuterol (PROVENTIL) (2.5 MG/3ML) 0.083% nebulizer solution 2.5 mg (has no administration in time range)  cefTRIAXone (ROCEPHIN) 1 g in sodium chloride 0.9 % 100 mL IVPB (has no administration in time range)  sodium chloride 0.9 % bolus 1,000 mL (0 mLs Intravenous Stopped 07/31/22 2153)  cefTRIAXone (ROCEPHIN) 2 g in sodium chloride 0.9 % 100 mL IVPB (0 g Intravenous Stopped 07/31/22 2047)   metroNIDAZOLE (FLAGYL) IVPB 500 mg (0 mg Intravenous Stopped 07/31/22 2131)  ondansetron (ZOFRAN) injection 4 mg (4 mg Intravenous Given 07/31/22 2152)    Mobility manual wheelchair     Focused Assessments Sepsis Workup.    R Recommendations: See Admitting Provider Note  Report given to:   Additional Notes: pt on 1L [place] but does not wear O2 at home. Working on weaning off pt. Pt uses wheelchair as mobility. Has braces on shoes for leg stability. Pt states they are able to stand and pivot.

## 2022-08-02 ENCOUNTER — Observation Stay (HOSPITAL_COMMUNITY): Payer: Medicare Other

## 2022-08-02 DIAGNOSIS — Z9861 Coronary angioplasty status: Secondary | ICD-10-CM | POA: Diagnosis not present

## 2022-08-02 DIAGNOSIS — N390 Urinary tract infection, site not specified: Secondary | ICD-10-CM | POA: Diagnosis present

## 2022-08-02 DIAGNOSIS — A419 Sepsis, unspecified organism: Secondary | ICD-10-CM | POA: Diagnosis present

## 2022-08-02 DIAGNOSIS — N3000 Acute cystitis without hematuria: Secondary | ICD-10-CM | POA: Diagnosis not present

## 2022-08-02 DIAGNOSIS — J9601 Acute respiratory failure with hypoxia: Secondary | ICD-10-CM | POA: Diagnosis present

## 2022-08-02 DIAGNOSIS — Z85820 Personal history of malignant melanoma of skin: Secondary | ICD-10-CM | POA: Diagnosis not present

## 2022-08-02 DIAGNOSIS — E860 Dehydration: Secondary | ICD-10-CM | POA: Diagnosis present

## 2022-08-02 DIAGNOSIS — K219 Gastro-esophageal reflux disease without esophagitis: Secondary | ICD-10-CM | POA: Diagnosis present

## 2022-08-02 DIAGNOSIS — I110 Hypertensive heart disease with heart failure: Secondary | ICD-10-CM | POA: Diagnosis present

## 2022-08-02 DIAGNOSIS — Z7901 Long term (current) use of anticoagulants: Secondary | ICD-10-CM | POA: Diagnosis not present

## 2022-08-02 DIAGNOSIS — R6520 Severe sepsis without septic shock: Secondary | ICD-10-CM | POA: Diagnosis present

## 2022-08-02 DIAGNOSIS — Z8249 Family history of ischemic heart disease and other diseases of the circulatory system: Secondary | ICD-10-CM | POA: Diagnosis not present

## 2022-08-02 DIAGNOSIS — F411 Generalized anxiety disorder: Secondary | ICD-10-CM | POA: Diagnosis present

## 2022-08-02 DIAGNOSIS — Z79899 Other long term (current) drug therapy: Secondary | ICD-10-CM | POA: Diagnosis not present

## 2022-08-02 DIAGNOSIS — G610 Guillain-Barre syndrome: Secondary | ICD-10-CM | POA: Diagnosis present

## 2022-08-02 DIAGNOSIS — R0902 Hypoxemia: Secondary | ICD-10-CM | POA: Diagnosis not present

## 2022-08-02 DIAGNOSIS — Z1152 Encounter for screening for COVID-19: Secondary | ICD-10-CM | POA: Diagnosis not present

## 2022-08-02 DIAGNOSIS — E785 Hyperlipidemia, unspecified: Secondary | ICD-10-CM | POA: Diagnosis present

## 2022-08-02 DIAGNOSIS — I4819 Other persistent atrial fibrillation: Secondary | ICD-10-CM | POA: Diagnosis present

## 2022-08-02 DIAGNOSIS — I252 Old myocardial infarction: Secondary | ICD-10-CM | POA: Diagnosis not present

## 2022-08-02 DIAGNOSIS — Z87442 Personal history of urinary calculi: Secondary | ICD-10-CM | POA: Diagnosis not present

## 2022-08-02 DIAGNOSIS — Z885 Allergy status to narcotic agent status: Secondary | ICD-10-CM | POA: Diagnosis not present

## 2022-08-02 LAB — CBC
HCT: 35.1 % — ABNORMAL LOW (ref 39.0–52.0)
Hemoglobin: 10.9 g/dL — ABNORMAL LOW (ref 13.0–17.0)
MCH: 29.2 pg (ref 26.0–34.0)
MCHC: 31.1 g/dL (ref 30.0–36.0)
MCV: 94.1 fL (ref 80.0–100.0)
Platelets: 116 10*3/uL — ABNORMAL LOW (ref 150–400)
RBC: 3.73 MIL/uL — ABNORMAL LOW (ref 4.22–5.81)
RDW: 13.9 % (ref 11.5–15.5)
WBC: 7.3 10*3/uL (ref 4.0–10.5)
nRBC: 0 % (ref 0.0–0.2)

## 2022-08-02 LAB — CULTURE, BLOOD (ROUTINE X 2)

## 2022-08-02 LAB — URINE CULTURE

## 2022-08-02 LAB — BASIC METABOLIC PANEL
Anion gap: 6 (ref 5–15)
BUN: 21 mg/dL (ref 8–23)
CO2: 28 mmol/L (ref 22–32)
Calcium: 8.1 mg/dL — ABNORMAL LOW (ref 8.9–10.3)
Chloride: 105 mmol/L (ref 98–111)
Creatinine, Ser: 0.85 mg/dL (ref 0.61–1.24)
GFR, Estimated: >60 mL/min (ref >60)
Glucose, Bld: 101 mg/dL — ABNORMAL HIGH (ref 70–99)
Potassium: 4.3 mmol/L (ref 3.5–5.1)
Sodium: 139 mmol/L (ref 135–145)

## 2022-08-02 LAB — MAGNESIUM: Magnesium: 2.3 mg/dL (ref 1.7–2.4)

## 2022-08-02 MED ORDER — IPRATROPIUM-ALBUTEROL 0.5-2.5 (3) MG/3ML IN SOLN: 3.0000 mL | Freq: Four times a day (QID) | RESPIRATORY_TRACT | Status: DC | PRN

## 2022-08-02 NOTE — Progress Notes (Signed)
PROGRESS NOTE    Theodore Mendoza  WUJ:[id number] DOB: December 18, 1940 DOA: 07/31/2022 PCP: Mechele Claude, MD   Brief Narrative:    Theodore Mendoza is a 82 y.o. male with medical history significant of AAA, atrial fibrillation, brain tumor, CHF, GERD, Guillain-Barr, hypertension, and more presents the ED with a chief complaint of flulike symptoms.  Patient reports that he had myalgias, nausea, vomiting that started on Monday.  Patient patient was admitted with mild hypoxemia as well as UTI and started on Rocephin with urine cultures demonstrating no specific growth.  He continues to have persistent hypoxemia.  Assessment & Plan:   Principal Problem:   UTI (urinary tract infection) Active Problems:   Sepsis (HCC)   Essential (primary) hypertension   Hyperlipidemia   Persistent atrial fibrillation (HCC)   Gastroesophageal reflux disease   Acute hypoxic respiratory failure (HCC)  Assessment and Plan:  Sepsis, POA, secondary to UTI (urinary tract infection) -Continue rocephin for total 3 days -Urine culture with growth of multiple organisms noted on   Acute hypoxic respiratory failure (HCC) -O2 sats down to 87% -2/2 sepsis? -Asymptomatic -Continue albuterol PRN -Wean off O2 as tolerated -Continue to monitor and wean as tolerated -Chest x-ray with no findings noted   Gastroesophageal reflux disease -Continue protonix   Persistent atrial fibrillation (HCC) -Continue Tikosyn and eliquis   Hyperlipidemia -Continue pravastatin    Essential (primary) hypertension -continue norvasc, lisinopril   DVT prophylaxis: Eliquis Code Status: Full Family Communication: None at bedside Disposition Plan:  Status is: Observation The patient will require care spanning > 2 midnights and should be moved to inpatient because: Need for continued IV antibiotics   Consultants:  None  Procedures:  None  Antimicrobials:  Anti-infectives (From admission, onward)    Start     Dose/Rate Route  Frequency Ordered Stop   08/01/22 2100  cefTRIAXone (ROCEPHIN) 1 g in sodium chloride 0.9 % 100 mL IVPB        1 g 200 mL/hr over 30 Minutes Intravenous Every 24 hours 07/31/22 2231     07/31/22 2015  cefTRIAXone (ROCEPHIN) 2 g in sodium chloride 0.9 % 100 mL IVPB        2 g 200 mL/hr over 30 Minutes Intravenous  Once 07/31/22 2001 07/31/22 2047   07/31/22 2015  metroNIDAZOLE (FLAGYL) IVPB 500 mg        500 mg 100 mL/hr over 60 Minutes Intravenous  Once 07/31/22 2001 07/31/22 2131      Subjective: Patient seen and evaluated today with no new acute complaints or concerns. No acute concerns or events noted overnight.  Objective: Vitals:   08/01/22 1528 08/01/22 2042 08/02/22 0533 08/02/22 0948  BP: (!) 130/56 (!) 117/46 (!) 168/73 (!) 144/59  Pulse: 62 (!) 56 77 72  Resp: 14 20    Temp: 97.7 F (36.5 C) 97.9 F (36.6 C) 98 F (36.7 C)   TempSrc: Oral Oral Oral   SpO2: 100% 99% 93% 99%  Weight:      Height:        Intake/Output Summary (Last 24 hours) at 08/02/2022 1145 Last data filed at 08/02/2022 0900 Gross per 24 hour  Intake 820 ml  Output 700 ml  Net 120 ml   Filed Weights   07/31/22 1945 08/01/22 0212  Weight: 68 kg 70.3 kg    Examination:  General exam: Appears calm and comfortable  Respiratory system: Clear to auscultation. Respiratory effort normal.  6 L nasal cannula Cardiovascular system: S1 & S2  heard, RRR.  Gastrointestinal system: Abdomen is soft Central nervous system: Alert and awake Extremities: No edema Skin: No significant lesions noted Psychiatry: Flat affect.    Data Reviewed: I have personally reviewed following labs and imaging studies  CBC: Recent Labs  Lab 07/31/22 2024 08/01/22 0428 08/02/22 0410  WBC 11.2* 9.0 7.3  NEUTROABS 10.1* 7.7  --   HGB 11.5* 10.6* 10.9*  HCT 36.4* 34.4* 35.1*  MCV 91.9 94.0 94.1  PLT 138* 114* 116*   Basic Metabolic Panel: Recent Labs  Lab 07/31/22 2024 08/01/22 0428 08/02/22 0410  NA 139 141  139  K 3.4* 3.2* 4.3  CL 100 105 105  CO2 31 28 28   GLUCOSE 122* 91 101*  BUN 19 18 21   CREATININE 0.98 0.88 0.85  CALCIUM 8.6* 8.1* 8.1*  MG  --  1.7 2.3   GFR: Estimated Creatinine Clearance: 63.7 mL/min (by C-G formula based on SCr of 0.85 mg/dL). Liver Function Tests: Recent Labs  Lab 07/31/22 2024 08/01/22 0428  AST 16 12*  ALT 9 9  ALKPHOS 62 53  BILITOT 1.3* 0.8  PROT 6.5 5.8*  ALBUMIN 3.7 3.3*   Recent Labs  Lab 07/31/22 2024  LIPASE 19   No results for input(s): "AMMONIA" in the last 168 hours. Coagulation Profile: Recent Labs  Lab 07/31/22 2024  INR 2.0*   Cardiac Enzymes: No results for input(s): "CKTOTAL", "CKMB", "CKMBINDEX", "TROPONINI" in the last 168 hours. BNP (last 3 results) No results for input(s): "PROBNP" in the last 8760 hours. HbA1C: No results for input(s): "HGBA1C" in the last 72 hours. CBG: No results for input(s): "GLUCAP" in the last 168 hours. Lipid Profile: No results for input(s): "CHOL", "HDL", "LDLCALC", "TRIG", "CHOLHDL", "LDLDIRECT" in the last 72 hours. Thyroid Function Tests: No results for input(s): "TSH", "T4TOTAL", "FREET4", "T3FREE", "THYROIDAB" in the last 72 hours. Anemia Panel: No results for input(s): "VITAMINB12", "FOLATE", "FERRITIN", "TIBC", "IRON", "RETICCTPCT" in the last 72 hours. Sepsis Labs: Recent Labs  Lab 07/31/22 2025 08/01/22 0428  PROCALCITON  --  0.45  LATICACIDVEN 1.1  --     Recent Results (from the past 240 hour(s))  Blood Culture (routine x 2)     Status: None (Preliminary result)   Collection Time: 07/31/22  8:25 PM   Specimen: BLOOD RIGHT ARM  Result Value Ref Range Status   Specimen Description BLOOD RIGHT ARM  Final   Special Requests   Final    BOTTLES DRAWN AEROBIC AND ANAEROBIC Blood Culture results may not be optimal due to an excessive volume of blood received in culture bottles   Culture   Final    NO GROWTH 2 DAYS Performed at [facility], [address] [place],  [place] [address]    Report Status PENDING  Incomplete  Urine Culture     Status: Abnormal   Collection Time: 07/31/22  8:39 PM   Specimen: Urine, Random  Result Value Ref Range Status   Specimen Description   Final    URINE, RANDOM Performed at [facility], [address], [place], [place] [address]    Special Requests   Final    NONE Reflexed from [id number] Performed at [facility], [address] [place], [place] [address]    Culture MULTIPLE SPECIES PRESENT, SUGGEST RECOLLECTION (A)  Final   Report Status 08/02/2022 FINAL  Final  Resp panel by RT-PCR (RSV, Flu A&B, Covid) Anterior Nasal Swab     Status: None   Collection Time: 07/31/22  8:40 PM  Specimen: Anterior Nasal Swab  Result Value Ref Range Status   SARS Coronavirus 2 by RT PCR NEGATIVE NEGATIVE Final    Comment: (NOTE) SARS-CoV-2 target nucleic acids are NOT DETECTED.  The SARS-CoV-2 RNA is generally detectable in upper respiratory specimens during the acute phase of infection. The lowest concentration of SARS-CoV-2 viral copies this assay can detect is 138 copies/mL. A negative result does not preclude SARS-Cov-2 infection and should not be used as the sole basis for treatment or other patient management decisions. A negative result may occur with  improper specimen collection/handling, submission of specimen other than nasopharyngeal swab, presence of viral mutation(s) within the areas targeted by this assay, and inadequate number of viral copies(<138 copies/mL). A negative result must be combined with clinical observations, patient history, and epidemiological information. The expected result is Negative.  Fact Sheet for Patients:  BloggerCourse.com  Fact Sheet for Healthcare Providers:  SeriousBroker.it  This test is no t yet approved or cleared by the [place] FDA and  has been authorized for detection and/or diagnosis of SARS-CoV-2 by FDA under an Emergency  Use Authorization (EUA). This EUA will remain  in effect (meaning this test can be used) for the duration of the COVID-19 declaration under Section 564(b)(1) of the Act, 21 U.S.C.section 360bbb-3(b)(1), unless the authorization is terminated  or revoked sooner.       Influenza A by PCR NEGATIVE NEGATIVE Final   Influenza B by PCR NEGATIVE NEGATIVE Final    Comment: (NOTE) The Xpert Xpress SARS-CoV-2/FLU/RSV plus assay is intended as an aid in the diagnosis of influenza from Nasopharyngeal swab specimens and should not be used as a sole basis for treatment. Nasal washings and aspirates are unacceptable for Xpert Xpress SARS-CoV-2/FLU/RSV testing.  Fact Sheet for Patients: BloggerCourse.com  Fact Sheet for Healthcare Providers: SeriousBroker.it  This test is not yet approved or cleared by the [place] FDA and has been authorized for detection and/or diagnosis of SARS-CoV-2 by FDA under an Emergency Use Authorization (EUA). This EUA will remain in effect (meaning this test can be used) for the duration of the COVID-19 declaration under Section 564(b)(1) of the Act, 21 U.S.C. section 360bbb-3(b)(1), unless the authorization is terminated or revoked.     Resp Syncytial Virus by PCR NEGATIVE NEGATIVE Final    Comment: (NOTE) Fact Sheet for Patients: BloggerCourse.com  Fact Sheet for Healthcare Providers: SeriousBroker.it  This test is not yet approved or cleared by the [place] FDA and has been authorized for detection and/or diagnosis of SARS-CoV-2 by FDA under an Emergency Use Authorization (EUA). This EUA will remain in effect (meaning this test can be used) for the duration of the COVID-19 declaration under Section 564(b)(1) of the Act, 21 U.S.C. section 360bbb-3(b)(1), unless the authorization is terminated or revoked.  Performed at [facility], [address], [place], [place] [address]   Blood Culture (routine x 2)     Status: None (Preliminary result)   Collection Time: 07/31/22  8:40 PM   Specimen: BLOOD LEFT WRIST  Result Value Ref Range Status   Specimen Description BLOOD LEFT WRIST  Final   Special Requests   Final    BOTTLES DRAWN AEROBIC AND ANAEROBIC Blood Culture results may not be optimal due to an excessive volume of blood received in culture bottles   Culture   Final    NO GROWTH 2 DAYS Performed at [facility], [address] [place], [place] [address]    Report Status PENDING  Incomplete  Radiology Studies: DG CHEST PORT 1 VIEW  Result Date: 08/02/2022 CLINICAL DATA:  Hypoxemia. EXAM: PORTABLE CHEST 1 VIEW COMPARISON:  07/31/2022 FINDINGS: Lung volumes are similar to the previous examination and no significant airspace disease or consolidation. There may be mild atelectasis at the medial left lung base. Heart size is stable and within normal limits. Atherosclerotic calcifications at the aortic arch. Trachea is midline. Negative for a pneumothorax. No acute bone abnormality. IMPRESSION: No active disease. Electronically Signed   By: Richarda Overlie M.D.   On: 08/02/2022 11:16   DG Chest Port 1 View  Result Date: 07/31/2022 CLINICAL DATA:  Fevers for 2 days, initial encounter EXAM: PORTABLE CHEST 1 VIEW COMPARISON:  08/12/2020 FINDINGS: Cardiac shadow is stable. Aortic calcifications are noted. The lungs are well aerated bilaterally. No focal infiltrate or effusion is seen. No bony abnormality is noted. IMPRESSION: No active disease. Electronically Signed   By: Alcide Clever M.D.   On: 07/31/2022 20:33        Scheduled Meds:  amLODipine  5 mg Oral Daily   apixaban  5 mg Oral BID   dofetilide  500 mcg Oral BID   escitalopram  20 mg Oral Daily   ferrous sulfate  325 mg Oral Daily   gabapentin  300 mg Oral TID   lisinopril  5 mg Oral Daily   mirtazapine  7.5 mg Oral QHS   pantoprazole  40 mg Oral BID   pravastatin  40  mg Oral Daily   Continuous Infusions:  cefTRIAXone (ROCEPHIN)  IV 1 g (08/01/22 2056)     LOS: 0 days    Time spent: 35 minutes    Gelisa Tieken Hoover Brunette, DO [facility]  If 7PM-7AM, please contact night-coverage [email or web address] 08/02/2022, 11:45 AM

## 2022-08-02 NOTE — Care Management Obs Status (Signed)
MEDICARE OBSERVATION STATUS NOTIFICATION   Patient Details  Name: Theodore Mendoza MRN: [id number] Date of Birth: [birth year]   Medicare Observation Status Notification Given:  Yes    Corey Harold 08/02/2022, 9:24 AM

## 2022-08-03 DIAGNOSIS — N3000 Acute cystitis without hematuria: Secondary | ICD-10-CM | POA: Diagnosis not present

## 2022-08-03 LAB — CBC
HCT: 32.7 % — ABNORMAL LOW (ref 39.0–52.0)
Hemoglobin: 10.1 g/dL — ABNORMAL LOW (ref 13.0–17.0)
MCH: 29.5 pg (ref 26.0–34.0)
MCHC: 30.9 g/dL (ref 30.0–36.0)
MCV: 95.6 fL (ref 80.0–100.0)
Platelets: 125 10*3/uL — ABNORMAL LOW (ref 150–400)
RBC: 3.42 MIL/uL — ABNORMAL LOW (ref 4.22–5.81)
RDW: 13.8 % (ref 11.5–15.5)
WBC: 5.2 10*3/uL (ref 4.0–10.5)
nRBC: 0 % (ref 0.0–0.2)

## 2022-08-03 LAB — BASIC METABOLIC PANEL
Anion gap: 5 (ref 5–15)
BUN: 20 mg/dL (ref 8–23)
CO2: 30 mmol/L (ref 22–32)
Calcium: 8.1 mg/dL — ABNORMAL LOW (ref 8.9–10.3)
Chloride: 104 mmol/L (ref 98–111)
Creatinine, Ser: 0.77 mg/dL (ref 0.61–1.24)
GFR, Estimated: >60 mL/min (ref >60)
Glucose, Bld: 101 mg/dL — ABNORMAL HIGH (ref 70–99)
Potassium: 3.8 mmol/L (ref 3.5–5.1)
Sodium: 139 mmol/L (ref 135–145)

## 2022-08-03 LAB — MAGNESIUM: Magnesium: 2.1 mg/dL (ref 1.7–2.4)

## 2022-08-03 LAB — CULTURE, BLOOD (ROUTINE X 2)

## 2022-08-03 MED ORDER — POTASSIUM CHLORIDE CRYS ER 20 MEQ PO TBCR
40.0000 meq | EXTENDED_RELEASE_TABLET | Freq: Once | ORAL | Status: AC
  Administered 2022-08-03: 40 meq via ORAL
  Filled 2022-08-03: qty 2

## 2022-08-03 NOTE — Progress Notes (Signed)
Pt discharged to home. DC instructions given with daughter and wife at bedside, no concerns verbalized. Pt left unit in wheelchair pushed by Victorino Dike, NT and accompanied by wife and Velna Hatchet, daughter.  Left in stable condition.

## 2022-08-03 NOTE — Discharge Summary (Signed)
Physician Discharge Summary  Arthel Curren Russum ZOX:[id number] DOB: 11-Jan-1941 DOA: 07/31/2022  PCP: Mechele Claude, MD  Admit date: 07/31/2022  Discharge date: 08/03/2022  Admitted From:Home  Disposition:  Home  Recommendations for Outpatient Follow-up:  Follow up with PCP in 1-2 weeks Continue on home medications as prior  Home Health: None  Equipment/Devices: Wheelchair bound  Discharge Condition:Stable  CODE STATUS: Full  Diet recommendation: Heart Healthy  Brief/Interim Summary:  Theodore Mendoza is a 82 y.o. male with medical history significant of AAA, atrial fibrillation, brain tumor, CHF, GERD, Guillain-Barr, hypertension, and more presents the ED with a chief complaint of flulike symptoms.  Patient reports that he had myalgias, nausea, vomiting that started on Monday.  Patient patient was admitted with mild hypoxemia as well as UTI and started on Rocephin with urine cultures demonstrating no specific growth.  He was noted to have some hypoxemia while admitted and required up to 6 L nasal cannula, but chest x-rays were negative and he was actually asymptomatic.  This was quickly weaned down back to room air and he is now in stable condition for discharge after completing 3-day course of IV antibiotics.  He denies any further symptomatic complaints or concerns and no other acute events or concerns noted throughout the course of this admission.  Discharge Diagnoses:  Principal Problem:   UTI (urinary tract infection) Active Problems:   Sepsis (HCC)   Essential (primary) hypertension   Hyperlipidemia   Persistent atrial fibrillation (HCC)   Gastroesophageal reflux disease   Acute hypoxic respiratory failure (HCC)  Principal discharge diagnosis: Sepsis, POA, secondary to UTI with mild acute hypoxemic respiratory failure-resolved.  Discharge Instructions  Discharge Instructions     Diet - low sodium heart healthy   Complete by: As directed    Increase activity slowly    Complete by: As directed       Allergies as of 08/03/2022       Reactions   Valsartan Rash   Morphine And Related Other (See Comments)   Reaction:  Confusion/weakness/hallucinations        Medication List     TAKE these medications    albuterol 108 (90 Base) MCG/ACT inhaler Commonly known as: VENTOLIN HFA INHALE 2 PUFFS INTO THE LUNGS EVERY 6 HOURS AS NEEDED FOR WHEEZING OR SHORTNESS OF BREATH   amLODipine 5 MG tablet Commonly known as: NORVASC Take 1 tablet (5 mg total) by mouth daily.   cyanocobalamin 1000 MCG tablet Commonly known as: VITAMIN B12 Take 1 tablet (1,000 mcg total) by mouth daily.   dofetilide 500 MCG capsule Commonly known as: TIKOSYN Take 1 capsule (500 mcg total) by mouth 2 (two) times daily.   Eliquis 5 MG Tabs tablet Generic drug: apixaban TAKE ONE TABLET BY MOUTH TWICE DAILY   escitalopram 20 MG tablet Commonly known as: LEXAPRO Take 1 tablet (20 mg total) by mouth daily.   FeroSul 325 (65 FE) MG tablet Generic drug: ferrous sulfate TAKE 1 TABLET DAILY   furosemide 20 MG tablet Commonly known as: LASIX Take 1 tablet (20 mg total) by mouth daily.   gabapentin 300 MG capsule Commonly known as: NEURONTIN Take 300 mg by mouth three times daily   lisinopril 5 MG tablet Commonly known as: ZESTRIL Take 1 tablet (5 mg total) by mouth daily.   mirtazapine 7.5 MG tablet Commonly known as: REMERON Take 1 tablet (7.5 mg total) by mouth at bedtime.   pantoprazole 40 MG tablet Commonly known as: PROTONIX Take 1 tablet (40  mg total) by mouth 2 (two) times daily.   pravastatin 40 MG tablet Commonly known as: PRAVACHOL Take 1 tablet (40 mg total) by mouth daily.   sucralfate 1 g tablet Commonly known as: CARAFATE Take 1 tablet (1 g total) by mouth 2 (two) times daily.   tamsulosin 0.4 MG Caps capsule Commonly known as: FLOMAX Take 1 capsule (0.4 mg total) by mouth daily.        Follow-up Information     Stacks, Broadus Abednego, MD.  Schedule an appointment as soon as possible for a visit in 1 week(s).   Specialty: Family Medicine Contact information: [address] [place] [address] [phone]                Allergies  Allergen Reactions   Valsartan Rash   Morphine And Related Other (See Comments)    Reaction:  Confusion/weakness/hallucinations    Consultations: None   Procedures/Studies: DG CHEST PORT 1 VIEW  Result Date: 08/02/2022 CLINICAL DATA:  Hypoxemia. EXAM: PORTABLE CHEST 1 VIEW COMPARISON:  07/31/2022 FINDINGS: Lung volumes are similar to the previous examination and no significant airspace disease or consolidation. There may be mild atelectasis at the medial left lung base. Heart size is stable and within normal limits. Atherosclerotic calcifications at the aortic arch. Trachea is midline. Negative for a pneumothorax. No acute bone abnormality. IMPRESSION: No active disease. Electronically Signed   By: Richarda Overlie M.D.   On: 08/02/2022 11:16   DG Chest Port 1 View  Result Date: 07/31/2022 CLINICAL DATA:  Fevers for 2 days, initial encounter EXAM: PORTABLE CHEST 1 VIEW COMPARISON:  08/12/2020 FINDINGS: Cardiac shadow is stable. Aortic calcifications are noted. The lungs are well aerated bilaterally. No focal infiltrate or effusion is seen. No bony abnormality is noted. IMPRESSION: No active disease. Electronically Signed   By: Alcide Clever M.D.   On: 07/31/2022 20:33     Discharge Exam: Vitals:   08/03/22 0446 08/03/22 0500  BP: (!) 144/65   Pulse: 66   Resp: 16 13  Temp: 97.8 F (36.6 C)   SpO2: 93%    Vitals:   08/02/22 1518 08/02/22 2143 08/03/22 0446 08/03/22 0500  BP:  (!) 142/63 (!) 144/65   Pulse: 62 (!) 59 66   Resp:  20 16 13   Temp:  98.8 F (37.1 C) 97.8 F (36.6 C)   TempSrc:  Oral Oral   SpO2: 98% 92% 93%   Weight:      Height:        General: Pt is alert, awake, not in acute distress Cardiovascular: RRR, S1/S2 +, no rubs, no gallops Respiratory: CTA  bilaterally, no wheezing, no rhonchi Abdominal: Soft, NT, ND, bowel sounds + Extremities: no edema, no cyanosis    The results of significant diagnostics from this hospitalization (including imaging, microbiology, ancillary and laboratory) are listed below for reference.     Microbiology: Recent Results (from the past 240 hour(s))  Blood Culture (routine x 2)     Status: None (Preliminary result)   Collection Time: 07/31/22  8:25 PM   Specimen: BLOOD RIGHT ARM  Result Value Ref Range Status   Specimen Description BLOOD RIGHT ARM  Final   Special Requests   Final    BOTTLES DRAWN AEROBIC AND ANAEROBIC Blood Culture results may not be optimal due to an excessive volume of blood received in culture bottles   Culture   Final    NO GROWTH 3 DAYS Performed at [facility], [address], [place], [place] [address]    Report Status PENDING  Incomplete  Urine Culture     Status: Abnormal   Collection Time: 07/31/22  8:39 PM   Specimen: Urine, Random  Result Value Ref Range Status   Specimen Description   Final    URINE, RANDOM Performed at [facility], [address], [place], [place] [address]    Special Requests   Final    NONE Reflexed from [id number] Performed at [facility], [address], [place], [place] [address]    Culture MULTIPLE SPECIES PRESENT, SUGGEST RECOLLECTION (A)  Final   Report Status 08/02/2022 FINAL  Final  Resp panel by RT-PCR (RSV, Flu A&B, Covid) Anterior Nasal Swab     Status: None   Collection Time: 07/31/22  8:40 PM   Specimen: Anterior Nasal Swab  Result Value Ref Range Status   SARS Coronavirus 2 by RT PCR NEGATIVE NEGATIVE Final    Comment: (NOTE) SARS-CoV-2 target nucleic acids are NOT DETECTED.  The SARS-CoV-2 RNA is generally detectable in upper respiratory specimens during the acute phase of infection. The lowest concentration of SARS-CoV-2 viral copies this assay can detect is 138 copies/mL. A negative result does not preclude  SARS-Cov-2 infection and should not be used as the sole basis for treatment or other patient management decisions. A negative result may occur with  improper specimen collection/handling, submission of specimen other than nasopharyngeal swab, presence of viral mutation(s) within the areas targeted by this assay, and inadequate number of viral copies(<138 copies/mL). A negative result must be combined with clinical observations, patient history, and epidemiological information. The expected result is Negative.  Fact Sheet for Patients:  BloggerCourse.com  Fact Sheet for Healthcare Providers:  SeriousBroker.it  This test is no t yet approved or cleared by the [place] FDA and  has been authorized for detection and/or diagnosis of SARS-CoV-2 by FDA under an Emergency Use Authorization (EUA). This EUA will remain  in effect (meaning this test can be used) for the duration of the COVID-19 declaration under Section 564(b)(1) of the Act, 21 U.S.C.section 360bbb-3(b)(1), unless the authorization is terminated  or revoked sooner.       Influenza A by PCR NEGATIVE NEGATIVE Final   Influenza B by PCR NEGATIVE NEGATIVE Final    Comment: (NOTE) The Xpert Xpress SARS-CoV-2/FLU/RSV plus assay is intended as an aid in the diagnosis of influenza from Nasopharyngeal swab specimens and should not be used as a sole basis for treatment. Nasal washings and aspirates are unacceptable for Xpert Xpress SARS-CoV-2/FLU/RSV testing.  Fact Sheet for Patients: BloggerCourse.com  Fact Sheet for Healthcare Providers: SeriousBroker.it  This test is not yet approved or cleared by the [place] FDA and has been authorized for detection and/or diagnosis of SARS-CoV-2 by FDA under an Emergency Use Authorization (EUA). This EUA will remain in effect (meaning this test can be used) for the duration of  the COVID-19 declaration under Section 564(b)(1) of the Act, 21 U.S.C. section 360bbb-3(b)(1), unless the authorization is terminated or revoked.     Resp Syncytial Virus by PCR NEGATIVE NEGATIVE Final    Comment: (NOTE) Fact Sheet for Patients: BloggerCourse.com  Fact Sheet for Healthcare Providers: SeriousBroker.it  This test is not yet approved or cleared by the [place] FDA and has been authorized for detection and/or diagnosis of SARS-CoV-2 by FDA under an Emergency Use Authorization (EUA). This EUA will remain in effect (meaning this test can be used) for the duration of the COVID-19 declaration under Section  564(b)(1) of the Act, 21 U.S.C. section 360bbb-3(b)(1), unless the authorization is terminated or revoked.  Performed at [facility], [address] [place], [place] [address]   Blood Culture (routine x 2)     Status: None (Preliminary result)   Collection Time: 07/31/22  8:40 PM   Specimen: BLOOD LEFT WRIST  Result Value Ref Range Status   Specimen Description BLOOD LEFT WRIST  Final   Special Requests   Final    BOTTLES DRAWN AEROBIC AND ANAEROBIC Blood Culture results may not be optimal due to an excessive volume of blood received in culture bottles   Culture   Final    NO GROWTH 3 DAYS Performed at [facility], [address], [place], [place] [address]    Report Status PENDING  Incomplete     Labs: BNP (last 3 results) No results for input(s): "BNP" in the last 8760 hours. Basic Metabolic Panel: Recent Labs  Lab 07/31/22 2024 08/01/22 0428 08/02/22 0410 08/03/22 0456  NA 139 141 139 139  K 3.4* 3.2* 4.3 3.8  CL 100 105 105 104  CO2 31 28 28 30   GLUCOSE 122* 91 101* 101*  BUN 19 18 21 20   CREATININE 0.98 0.88 0.85 0.77  CALCIUM 8.6* 8.1* 8.1* 8.1*  MG  --  1.7 2.3 2.1   Liver Function Tests: Recent Labs  Lab 07/31/22 2024 08/01/22 0428  AST 16 12*  ALT 9 9  ALKPHOS 62 53  BILITOT  1.3* 0.8  PROT 6.5 5.8*  ALBUMIN 3.7 3.3*   Recent Labs  Lab 07/31/22 2024  LIPASE 19   No results for input(s): "AMMONIA" in the last 168 hours. CBC: Recent Labs  Lab 07/31/22 2024 08/01/22 0428 08/02/22 0410 08/03/22 0456  WBC 11.2* 9.0 7.3 5.2  NEUTROABS 10.1* 7.7  --   --   HGB 11.5* 10.6* 10.9* 10.1*  HCT 36.4* 34.4* 35.1* 32.7*  MCV 91.9 94.0 94.1 95.6  PLT 138* 114* 116* 125*   Cardiac Enzymes: No results for input(s): "CKTOTAL", "CKMB", "CKMBINDEX", "TROPONINI" in the last 168 hours. BNP: Invalid input(s): "POCBNP" CBG: No results for input(s): "GLUCAP" in the last 168 hours. D-Dimer No results for input(s): "DDIMER" in the last 72 hours. Hgb A1c No results for input(s): "HGBA1C" in the last 72 hours. Lipid Profile No results for input(s): "CHOL", "HDL", "LDLCALC", "TRIG", "CHOLHDL", "LDLDIRECT" in the last 72 hours. Thyroid function studies No results for input(s): "TSH", "T4TOTAL", "T3FREE", "THYROIDAB" in the last 72 hours.  Invalid input(s): "FREET3" Anemia work up No results for input(s): "VITAMINB12", "FOLATE", "FERRITIN", "TIBC", "IRON", "RETICCTPCT" in the last 72 hours. Urinalysis    Component Value Date/Time   COLORURINE AMBER (A) 07/31/2022 2039   APPEARANCEUR CLOUDY (A) 07/31/2022 2039   LABSPEC 1.018 07/31/2022 2039   PHURINE 5.0 07/31/2022 2039   GLUCOSEU NEGATIVE 07/31/2022 2039   HGBUR LARGE (A) 07/31/2022 2039   BILIRUBINUR NEGATIVE 07/31/2022 2039   KETONESUR NEGATIVE 07/31/2022 2039   PROTEINUR 100 (A) 07/31/2022 2039   UROBILINOGEN 1.0 05/07/2014 1838   NITRITE NEGATIVE 07/31/2022 2039   LEUKOCYTESUR LARGE (A) 07/31/2022 2039   Sepsis Labs Recent Labs  Lab 07/31/22 2024 08/01/22 0428 08/02/22 0410 08/03/22 0456  WBC 11.2* 9.0 7.3 5.2   Microbiology Recent Results (from the past 240 hour(s))  Blood Culture (routine x 2)     Status: None (Preliminary result)   Collection Time: 07/31/22  8:25 PM   Specimen: BLOOD RIGHT  ARM  Result Value Ref Range  Status   Specimen Description BLOOD RIGHT ARM  Final   Special Requests   Final    BOTTLES DRAWN AEROBIC AND ANAEROBIC Blood Culture results may not be optimal due to an excessive volume of blood received in culture bottles   Culture   Final    NO GROWTH 3 DAYS Performed at [facility], [address], [place], [place] [address]    Report Status PENDING  Incomplete  Urine Culture     Status: Abnormal   Collection Time: 07/31/22  8:39 PM   Specimen: Urine, Random  Result Value Ref Range Status   Specimen Description   Final    URINE, RANDOM Performed at [facility], [address] [place], [place] [address]    Special Requests   Final    NONE Reflexed from [id number] Performed at [facility], [address], [place], [place] [address]    Culture MULTIPLE SPECIES PRESENT, SUGGEST RECOLLECTION (A)  Final   Report Status 08/02/2022 FINAL  Final  Resp panel by RT-PCR (RSV, Flu A&B, Covid) Anterior Nasal Swab     Status: None   Collection Time: 07/31/22  8:40 PM   Specimen: Anterior Nasal Swab  Result Value Ref Range Status   SARS Coronavirus 2 by RT PCR NEGATIVE NEGATIVE Final    Comment: (NOTE) SARS-CoV-2 target nucleic acids are NOT DETECTED.  The SARS-CoV-2 RNA is generally detectable in upper respiratory specimens during the acute phase of infection. The lowest concentration of SARS-CoV-2 viral copies this assay can detect is 138 copies/mL. A negative result does not preclude SARS-Cov-2 infection and should not be used as the sole basis for treatment or other patient management decisions. A negative result may occur with  improper specimen collection/handling, submission of specimen other than nasopharyngeal swab, presence of viral mutation(s) within the areas targeted by this assay, and inadequate number of viral copies(<138 copies/mL). A negative result must be combined with clinical observations, patient history, and epidemiological information.  The expected result is Negative.  Fact Sheet for Patients:  BloggerCourse.com  Fact Sheet for Healthcare Providers:  SeriousBroker.it  This test is no t yet approved or cleared by the [place] FDA and  has been authorized for detection and/or diagnosis of SARS-CoV-2 by FDA under an Emergency Use Authorization (EUA). This EUA will remain  in effect (meaning this test can be used) for the duration of the COVID-19 declaration under Section 564(b)(1) of the Act, 21 U.S.C.section 360bbb-3(b)(1), unless the authorization is terminated  or revoked sooner.       Influenza A by PCR NEGATIVE NEGATIVE Final   Influenza B by PCR NEGATIVE NEGATIVE Final    Comment: (NOTE) The Xpert Xpress SARS-CoV-2/FLU/RSV plus assay is intended as an aid in the diagnosis of influenza from Nasopharyngeal swab specimens and should not be used as a sole basis for treatment. Nasal washings and aspirates are unacceptable for Xpert Xpress SARS-CoV-2/FLU/RSV testing.  Fact Sheet for Patients: BloggerCourse.com  Fact Sheet for Healthcare Providers: SeriousBroker.it  This test is not yet approved or cleared by the [place] FDA and has been authorized for detection and/or diagnosis of SARS-CoV-2 by FDA under an Emergency Use Authorization (EUA). This EUA will remain in effect (meaning this test can be used) for the duration of the COVID-19 declaration under Section 564(b)(1) of the Act, 21 U.S.C. section 360bbb-3(b)(1), unless the authorization is terminated or revoked.     Resp Syncytial Virus by PCR NEGATIVE NEGATIVE Final    Comment: (NOTE) Fact Sheet for  Patients: BloggerCourse.com  Fact Sheet for Healthcare Providers: SeriousBroker.it  This test is not yet approved or cleared by the [place] FDA and has been authorized for detection and/or  diagnosis of SARS-CoV-2 by FDA under an Emergency Use Authorization (EUA). This EUA will remain in effect (meaning this test can be used) for the duration of the COVID-19 declaration under Section 564(b)(1) of the Act, 21 U.S.C. section 360bbb-3(b)(1), unless the authorization is terminated or revoked.  Performed at [facility], [address], [place], [place] [address]   Blood Culture (routine x 2)     Status: None (Preliminary result)   Collection Time: 07/31/22  8:40 PM   Specimen: BLOOD LEFT WRIST  Result Value Ref Range Status   Specimen Description BLOOD LEFT WRIST  Final   Special Requests   Final    BOTTLES DRAWN AEROBIC AND ANAEROBIC Blood Culture results may not be optimal due to an excessive volume of blood received in culture bottles   Culture   Final    NO GROWTH 3 DAYS Performed at [facility], [address], [place], [place] [address]    Report Status PENDING  Incomplete     Time coordinating discharge: 35 minutes  SIGNED:   Erick Blinks, DO [facility] 08/03/2022, 9:11 AM  If 7PM-7AM, please contact night-coverage [email or web address]

## 2022-08-04 LAB — CULTURE, BLOOD (ROUTINE X 2)

## 2022-08-05 LAB — CULTURE, BLOOD (ROUTINE X 2): Culture: NO GROWTH

## 2022-08-06 ENCOUNTER — Ambulatory Visit: Payer: Self-pay | Admitting: *Deleted

## 2022-08-06 ENCOUNTER — Encounter: Payer: Self-pay | Admitting: *Deleted

## 2022-08-06 ENCOUNTER — Telehealth: Payer: Self-pay | Admitting: *Deleted

## 2022-08-06 NOTE — Chronic Care Management (AMB) (Signed)
   08/06/2022  Theodore Mendoza May 03, 1940 [id number]   Patient is not enrolled in CCM services, status changed to previously enrolled.  Irving Shows [facility], BSN RN Case Manager [phone]

## 2022-08-06 NOTE — Transitions of Care (Post Inpatient/ED Visit) (Signed)
08/06/2022  Name: Theodore Mendoza MRN: [id number] DOB: [birth year]  Today's TOC FU Call Status: Today's TOC FU Call Status:: Successful TOC FU Call Competed TOC FU Call Complete Date: 08/06/22  Transition Care Management Follow-up Telephone Call Date of Discharge: 08/03/22 [facility] Type of Discharge: Inpatient Admission Primary Inpatient Discharge Diagnosis:: UTI How have you been since you were released from the hospital?: Better Any questions or concerns?: No  Items Reviewed: Did you receive and understand the discharge instructions provided?: Yes Medications obtained,verified, and reconciled?: Yes (Medications Reviewed) Dietary orders reviewed?: Yes Type of Diet Ordered:: Low Sodium Heart healthy Do you have support at home?: Yes People in Home: spouse Name of Support/Comfort Primary Source: [place]  Medications Reviewed Today: Medications Reviewed Today     Reviewed by Gwenith Daily, RN (Registered Nurse) on 08/06/22 at 1141  Med List Status: <None>   Medication Order Taking? Sig Documenting Provider Last Dose Status Informant  albuterol (VENTOLIN HFA) 108 (90 Base) MCG/ACT inhaler [id number]  INHALE 2 PUFFS INTO THE LUNGS EVERY 6 HOURS AS NEEDED FOR WHEEZING OR SHORTNESS OF BREATH Deliah Boston F, FNP  Active Self  amLODipine (NORVASC) 5 MG tablet [id number]  Take 1 tablet (5 mg total) by mouth daily. Mechele Claude, MD  Active Self  apixaban (ELIQUIS) 5 MG TABS tablet [id number]  TAKE ONE TABLET BY MOUTH TWICE DAILY Mechele Claude, MD  Active Self  dofetilide (TIKOSYN) 500 MCG capsule [id number]  Take 1 capsule (500 mcg total) by mouth 2 (two) times daily. Mechele Claude, MD  Active Self  escitalopram (LEXAPRO) 20 MG tablet [id number]  Take 1 tablet (20 mg total) by mouth daily. Mechele Claude, MD  Active Self  FEROSUL 325 (65 Fe) MG tablet [id number]  TAKE 1 TABLET DAILY Marguerita Merles, Reuel Boom, MD  Active Self  furosemide (LASIX) 20 MG tablet  [id number]  Take 1 tablet (20 mg total) by mouth daily. Mechele Claude, MD  Active Self  gabapentin (NEURONTIN) 300 MG capsule [id number]  Take 300 mg by mouth three times daily Deliah Boston F, FNP  Active Self  lisinopril (ZESTRIL) 5 MG tablet [id number]  Take 1 tablet (5 mg total) by mouth daily. Mechele Claude, MD  Active Self  mirtazapine (REMERON) 7.5 MG tablet [id number]  Take 1 tablet (7.5 mg total) by mouth at bedtime. Mechele Claude, MD  Active Self  pantoprazole (PROTONIX) 40 MG tablet [id number]  Take 1 tablet (40 mg total) by mouth 2 (two) times daily. Mechele Claude, MD  Active Self  pravastatin (PRAVACHOL) 40 MG tablet [id number]  Take 1 tablet (40 mg total) by mouth daily. Mechele Claude, MD  Active Self  sucralfate (CARAFATE) 1 g tablet [id number]  Take 1 tablet (1 g total) by mouth 2 (two) times daily. Mechele Claude, MD  Active Self  tamsulosin [facility]) 0.4 MG CAPS capsule [id number]  Take 1 capsule (0.4 mg total) by mouth daily. Mechele Claude, MD  Active Self  vitamin B-12 (CYANOCOBALAMIN) 1000 MCG tablet [id number]  Take 1 tablet (1,000 mcg total) by mouth daily. Tyrone Nine, MD  Active Self            Home Care and Equipment/Supplies: Were Home Health Services Ordered?: No Any new equipment or medical supplies ordered?: No  Functional Questionnaire: Do you need assistance with bathing/showering or dressing?: Yes Do you need assistance with meal preparation?: Yes Do you need assistance with eating?: No Do you have difficulty maintaining continence: No Do you need assistance with  getting out of bed/getting out of a chair/moving?: Yes Do you have difficulty managing or taking your medications?: No  Follow up appointments reviewed: PCP Follow-up appointment confirmed?: Yes Date of PCP follow-up appointment?: 08/15/22 Follow-up Provider: [facility] DOD Specialist Hospital Follow-up appointment confirmed?: NA Do you need transportation to your follow-up appointment?: No Do you  understand care options if your condition(s) worsen?: Yes-patient verbalized understanding  SDOH Interventions Today    Flowsheet Row Most Recent Value  SDOH Interventions   Transportation Interventions Patient Resources (Friends/Family)  Utilities Interventions Intervention Not Indicated      Interventions Today    Flowsheet Row Most Recent Value  Chronic Disease   Chronic disease during today's visit Other  [Recent hospitalization for UTI/Sepsis]  General Interventions   General Interventions Discussed/Reviewed Durable Medical Equipment (DME), General Interventions Discussed, General Interventions Reviewed, Doctor Visits  Doctor Visits Discussed/Reviewed Doctor Visits Discussed, Doctor Visits Reviewed, PCP, Specialist  Durable Medical Equipment (DME) Wheelchair  Wheelchair Standard  PCP/Specialist Visits Compliance with follow-up visit  Exercise Interventions   Exercise Discussed/Reviewed Physical Activity  Physical Activity Discussed/Reviewed Physical Activity Discussed, Physical Activity Reviewed  Education Interventions   Education Provided Provided Education  Provided Verbal Education On Mental Health/Coping with Illness, When to see the doctor, Medication  Nutrition Interventions   Nutrition Discussed/Reviewed Nutrition Discussed, Nutrition Reviewed, Decreasing salt  Pharmacy Interventions   Pharmacy Dicussed/Reviewed Pharmacy Topics Discussed, Pharmacy Topics Reviewed, Medications and their functions  Safety Interventions   Safety Discussed/Reviewed Safety Discussed, Safety Reviewed, Fall Risk, Home Safety  Home Safety Assistive Devices      TOC Interventions Today    Flowsheet Row Most Recent Value  TOC Interventions   TOC Interventions Discussed/Reviewed TOC Interventions Discussed, TOC Interventions Reviewed, S/S of infection      Demetrios Loll, BSN, RN-BC RN Care Coordinator [facility]  [facility] Direct Dial: [phone] Main #: [phone]

## 2022-08-15 ENCOUNTER — Encounter: Payer: Self-pay | Admitting: Family Medicine

## 2022-08-15 ENCOUNTER — Ambulatory Visit (INDEPENDENT_AMBULATORY_CARE_PROVIDER_SITE_OTHER): Payer: Medicare Other | Admitting: Family Medicine

## 2022-08-15 VITALS — HR 58 | Temp 97.6°F | Ht 68.0 in | Wt 150.0 lb

## 2022-08-15 DIAGNOSIS — I10 Essential (primary) hypertension: Secondary | ICD-10-CM

## 2022-08-15 DIAGNOSIS — J9601 Acute respiratory failure with hypoxia: Secondary | ICD-10-CM

## 2022-08-15 DIAGNOSIS — R6520 Severe sepsis without septic shock: Secondary | ICD-10-CM | POA: Diagnosis not present

## 2022-08-15 DIAGNOSIS — A419 Sepsis, unspecified organism: Secondary | ICD-10-CM | POA: Diagnosis not present

## 2022-08-15 LAB — URINALYSIS
Bilirubin, UA: NEGATIVE
Glucose, UA: NEGATIVE
Ketones, UA: NEGATIVE
Leukocytes,UA: NEGATIVE
Nitrite, UA: NEGATIVE
Protein,UA: NEGATIVE
RBC, UA: NEGATIVE
Specific Gravity, UA: 1.015 (ref 1.005–1.030)
Urobilinogen, Ur: 0.2 mg/dL (ref 0.2–1.0)
pH, UA: 7 (ref 5.0–7.5)

## 2022-08-15 LAB — CMP14+EGFR
ALT: 9 IU/L (ref 0–44)
AST: 11 IU/L (ref 0–40)
Albumin/Globulin Ratio: 2.1 (ref 1.2–2.2)
Albumin: 3.6 g/dL — ABNORMAL LOW (ref 3.7–4.7)
Alkaline Phosphatase: 67 IU/L (ref 44–121)
BUN/Creatinine Ratio: 19 (ref 10–24)
BUN: 13 mg/dL (ref 8–27)
Bilirubin Total: 0.4 mg/dL (ref 0.0–1.2)
CO2: 28 mmol/L (ref 20–29)
Calcium: 8.6 mg/dL (ref 8.6–10.2)
Chloride: 107 mmol/L — ABNORMAL HIGH (ref 96–106)
Creatinine, Ser: 0.7 mg/dL — ABNORMAL LOW (ref 0.76–1.27)
Globulin, Total: 1.7 g/dL (ref 1.5–4.5)
Glucose: 95 mg/dL (ref 70–99)
Potassium: 4.2 mmol/L (ref 3.5–5.2)
Sodium: 146 mmol/L — ABNORMAL HIGH (ref 134–144)
Total Protein: 5.3 g/dL — ABNORMAL LOW (ref 6.0–8.5)
eGFR: 91 mL/min/{1.73_m2} (ref >59)

## 2022-08-15 LAB — CBC WITH DIFFERENTIAL/PLATELET
Basophils Absolute: 0.1 10*3/uL (ref 0.0–0.2)
Basos: 2 %
EOS (ABSOLUTE): 0.2 10*3/uL (ref 0.0–0.4)
Eos: 4 %
Hematocrit: 33.7 % — ABNORMAL LOW (ref 37.5–51.0)
Hemoglobin: 10.4 g/dL — ABNORMAL LOW (ref 13.0–17.7)
Immature Grans (Abs): 0 10*3/uL (ref 0.0–0.1)
Immature Granulocytes: 1 %
Lymphocytes Absolute: 1.1 10*3/uL (ref 0.7–3.1)
Lymphs: 21 %
MCH: 28.3 pg (ref 26.6–33.0)
MCHC: 30.9 g/dL — ABNORMAL LOW (ref 31.5–35.7)
MCV: 92 fL (ref 79–97)
Monocytes Absolute: 0.3 10*3/uL (ref 0.1–0.9)
Monocytes: 5 %
Neutrophils Absolute: 3.6 10*3/uL (ref 1.4–7.0)
Neutrophils: 67 %
Platelets: 254 10*3/uL (ref 150–450)
RBC: 3.67 x10E6/uL — ABNORMAL LOW (ref 4.14–5.80)
RDW: 12.8 % (ref 11.6–15.4)
WBC: 5.4 10*3/uL (ref 3.4–10.8)

## 2022-08-15 NOTE — Progress Notes (Signed)
Subjective:  Patient ID: Theodore Mendoza, male    DOB: 12-15-1940  Age: 82 y.o. MRN: [id number]  CC: Hospitalization Follow-up   HPI [name] presents for transition of care visit due to hospitalization for sepsis from 07/31/22 to 08/03/22. He is doing well at home now. No current complaints except some frequency of urination. He is paralyzed from the waist down due to [employer] syndrome many years ago. He is wheelchair bound. He controls his bladder. BP high today. Doesn't check it at home.      08/15/2022    8:42 AM 07/16/2022    8:52 AM 07/16/2022    8:43 AM  Depression screen PHQ 2/9  Decreased Interest 1 2 0  Down, Depressed, Hopeless 1 2 0  PHQ - 2 Score 2 4 0  Altered sleeping 3 3   Tired, decreased energy 3    Change in appetite 0 0   Feeling bad or failure about yourself  1 2   Trouble concentrating 0 0   Moving slowly or fidgety/restless 0 1   Suicidal thoughts 0 0   PHQ-9 Score 9 10   Difficult doing work/chores Not difficult at all Somewhat difficult     History [name] has a past medical history of AAA (abdominal aortic aneurysm) (HCC), Atrial fibrillation (HCC), Brain tumor (HCC), Cancer (HCC), Cardiac arrhythmia, CHF (congestive heart failure) (HCC), GERD (gastroesophageal reflux disease), Guillain-Barre syndrome (HCC) (02/13/1986), Headache, Hypertension, Kidney stones, and Myocardial infarction (HCC) (2007).   He has a past surgical history that includes Cystoscopy with stent placement (Left, 05/08/2014); Coronary angioplasty (1997); Eye surgery (Right, may 2015); gamma kniferadiation treatment (Feb 09 2014); Lithotripsy (years ago); Cystoscopy with retrograde pyelogram, ureteroscopy and stent placement (Left, 06/28/2014); Holmium laser application (Left, 06/28/2014); Stone extraction with basket (Left, 06/28/2014); Esophagogastroduodenoscopy (egd) with propofol (N/A, 12/16/2019); Hemostasis clip placement (12/16/2019); Hemostasis control (12/16/2019); polypectomy  (12/16/2019); Esophagogastroduodenoscopy (egd) with propofol (N/A, 08/13/2020); Colonoscopy with propofol (N/A, 08/14/2020); Esophagogastroduodenoscopy (egd) with propofol (N/A, 09/16/2020); enteroscopy (N/A, 09/16/2020); Givens capsule study (N/A, 10/11/2020); Esophagogastroduodenoscopy (egd) with propofol (N/A, 10/25/2020); enteroscopy (N/A, 10/25/2020); and Abdominal aortic endovascular stent graft (Bilateral, 04/04/2022).   His family history includes CAD in his father; Diabetes in his brother, mother, and sister; Lung cancer in his mother; Other in his maternal grandfather; Stroke in his sister.He reports that he has never smoked. He has never been exposed to tobacco smoke. He has never used smokeless tobacco. He reports that he does not drink alcohol and does not use drugs.    ROS Review of Systems  Constitutional: Negative.   HENT: Negative.    Eyes:  Negative for visual disturbance.  Respiratory:  Negative for cough and shortness of breath.   Cardiovascular:  Negative for chest pain and leg swelling.  Gastrointestinal:  Negative for abdominal pain, diarrhea, nausea and vomiting.  Genitourinary:  Negative for difficulty urinating.  Musculoskeletal:  Positive for gait problem. Negative for arthralgias and myalgias.  Skin:  Negative for rash.  Neurological:  Positive for weakness and numbness. Negative for dizziness, seizures, syncope and headaches.  Psychiatric/Behavioral:  Negative for sleep disturbance.     Objective:  Pulse (!) 58   Temp 97.6 F (36.4 C)   Ht 5\' 8"  (1.727 m)   Wt 150 lb (68 kg) Comment: PATIENT REPORTED  SpO2 97%   BMI 22.81 kg/m   BP Readings from Last 3 Encounters:  08/03/22 (!) 144/65  07/16/22 139/62  05/14/22 127/66    Wt Readings from Last  3 Encounters:  08/15/22 150 lb (68 kg)  08/01/22 154 lb 15.7 oz (70.3 kg)  07/16/22 150 lb (68 kg)     Physical Exam Vitals reviewed.  Constitutional:      General: He is not in acute distress.    Appearance: He  is well-developed.  HENT:     Head: Normocephalic and atraumatic.     Right Ear: External ear normal.     Left Ear: External ear normal.     Nose: Nose normal.     Mouth/Throat:     Pharynx: No oropharyngeal exudate or posterior oropharyngeal erythema.  Eyes:     Conjunctiva/sclera: Conjunctivae normal.     Pupils: Pupils are equal, round, and reactive to light.  Cardiovascular:     Rate and Rhythm: Normal rate and regular rhythm.     Heart sounds: Normal heart sounds. No murmur heard. Pulmonary:     Effort: Pulmonary effort is normal. No respiratory distress.     Breath sounds: Normal breath sounds. No wheezing or rales.  Abdominal:     Palpations: Abdomen is soft.     Tenderness: There is no abdominal tenderness.  Musculoskeletal:     Cervical back: Normal range of motion and neck supple.     Comments: Weak below waist. In WC  Skin:    General: Skin is warm and dry.  Neurological:     Mental Status: He is alert and oriented to person, place, and time.     Motor: Weakness (BLE) present.     Deep Tendon Reflexes: Reflexes are normal and symmetric.  Psychiatric:        Behavior: Behavior normal.        Thought Content: Thought content normal.        Judgment: Judgment normal.       Assessment & Plan:   Emmett was seen today for hospitalization follow-up.  Diagnoses and all orders for this visit:  Sepsis with acute hypoxic respiratory failure without septic shock, due to unspecified organism (HCC) -     Urinalysis -     Urine Culture -     CBC with Differential/Platelet -     CMP14+EGFR  Essential (primary) hypertension       I am having Sha L. Strebeck maintain his cyanocobalamin, albuterol, gabapentin, FeroSul, amLODipine, escitalopram, furosemide, lisinopril, mirtazapine, pantoprazole, pravastatin, sucralfate, tamsulosin, Eliquis, and dofetilide.  Allergies as of 08/15/2022       Reactions   Valsartan Rash   Morphine And Codeine Other (See Comments)    Reaction:  Confusion/weakness/hallucinations        Medication List        Accurate as of Aug 15, 2022 11:51 AM. If you have any questions, ask your nurse or doctor.          albuterol 108 (90 Base) MCG/ACT inhaler Commonly known as: VENTOLIN HFA INHALE 2 PUFFS INTO THE LUNGS EVERY 6 HOURS AS NEEDED FOR WHEEZING OR SHORTNESS OF BREATH   amLODipine 5 MG tablet Commonly known as: NORVASC Take 1 tablet (5 mg total) by mouth daily.   cyanocobalamin 1000 MCG tablet Commonly known as: VITAMIN B12 Take 1 tablet (1,000 mcg total) by mouth daily.   dofetilide 500 MCG capsule Commonly known as: TIKOSYN Take 1 capsule (500 mcg total) by mouth 2 (two) times daily.   Eliquis 5 MG Tabs tablet Generic drug: apixaban TAKE ONE TABLET BY MOUTH TWICE DAILY   escitalopram 20 MG tablet Commonly known as: LEXAPRO Take 1 tablet (  20 mg total) by mouth daily.   FeroSul 325 (65 FE) MG tablet Generic drug: ferrous sulfate TAKE 1 TABLET DAILY   furosemide 20 MG tablet Commonly known as: LASIX Take 1 tablet (20 mg total) by mouth daily.   gabapentin 300 MG capsule Commonly known as: NEURONTIN Take 300 mg by mouth three times daily   lisinopril 5 MG tablet Commonly known as: ZESTRIL Take 1 tablet (5 mg total) by mouth daily.   mirtazapine 7.5 MG tablet Commonly known as: REMERON Take 1 tablet (7.5 mg total) by mouth at bedtime.   pantoprazole 40 MG tablet Commonly known as: PROTONIX Take 1 tablet (40 mg total) by mouth 2 (two) times daily.   pravastatin 40 MG tablet Commonly known as: PRAVACHOL Take 1 tablet (40 mg total) by mouth daily.   sucralfate 1 g tablet Commonly known as: CARAFATE Take 1 tablet (1 g total) by mouth 2 (two) times daily.   tamsulosin 0.4 MG Caps capsule Commonly known as: FLOMAX Take 1 capsule (0.4 mg total) by mouth daily.         Follow-up: No follow-ups on file.  Mechele Claude, M.D.

## 2022-08-16 NOTE — Progress Notes (Signed)
Hello Bravlio,  Your lab result is normal and/or stable.Some minor variations that are not significant are commonly marked abnormal, but do not represent any medical problem for you.  Best regards, Lalitha Ilyas, M.D.

## 2022-08-17 LAB — URINE CULTURE

## 2022-08-18 LAB — URINE CULTURE

## 2022-08-20 DIAGNOSIS — N480 Leukoplakia of penis: Secondary | ICD-10-CM | POA: Diagnosis not present

## 2022-08-20 DIAGNOSIS — N281 Cyst of kidney, acquired: Secondary | ICD-10-CM | POA: Diagnosis not present

## 2022-08-20 DIAGNOSIS — N202 Calculus of kidney with calculus of ureter: Secondary | ICD-10-CM | POA: Diagnosis not present

## 2022-08-28 ENCOUNTER — Ambulatory Visit: Payer: Medicare Other | Admitting: Pharmacist

## 2022-08-28 NOTE — Progress Notes (Signed)
08/28/2022 Name: MALEEK BOUGHTON MRN: [id number] DOB: March 17, 1941  Theodore Mendoza is a 82 y.o. year old male who presented for a telephone visit.   They were referred to the pharmacist by their PCP for assistance in managing medication access.   Subjective:  Care Team: Primary Care Provider: Mechele Claude, MD ; Next Scheduled Visit: 10/16/2022  Medication Access/Adherence  Current Pharmacy:  [facility] [place], [place] - [address] [place] [address] Phone: [phone] Fax: [phone]  [facility] [address] [place] - [place] [address] [place] [address] [place] [address] Phone: [phone] Fax: [phone]  [facility] - [place], [place] - [address] [address] Phone: [phone] Fax: [phone]   Patient reports affordability concerns with their medications: Yes - trouble affording Eliquis  Patient reports access/transportation concerns to their pharmacy: No  Patient reports adherence concerns with their medications:  No    Atrial Fibrillation:  CHADS2-VASc Score is 70 (age, HTN, vascular disease, hx of prior CVA)   Current medications: Rate/rhythm Control: Tikosyn Anticoagulation Regimen: Eliquis   Current blood pressure readings: 120-130s/60s  Patient denies hypotensive s/sx including dizziness, lightheadedness. Patient denies hypertensive symptoms including headache, chest pain, shortness of breath   Objective:  Lab Results  Component Value Date   HGBA1C 4.7 05/06/2020    Lab Results  Component Value Date   CREATININE 0.70 (L) 08/15/2022   BUN 13 08/15/2022   NA 146 (H) 08/15/2022   K 4.2 08/15/2022   CL 107 (H) 08/15/2022   CO2 28 08/15/2022    Lab Results  Component Value Date   CHOL 110 07/16/2022   HDL 49 07/16/2022   LDLCALC 51 07/16/2022   TRIG 38 07/16/2022   CHOLHDL 2.2 07/16/2022    Medications Reviewed Today      Reviewed by Diamantina Monks, LPN (Licensed Practical Nurse) on 08/15/22 at [phone]  Med List Status: <None>   Medication Order Taking? Sig Documenting Provider Last Dose Status Informant  albuterol (VENTOLIN HFA) 108 (90 Base) MCG/ACT inhaler [id number] Yes INHALE 2 PUFFS INTO THE LUNGS EVERY 6 HOURS AS NEEDED FOR WHEEZING OR SHORTNESS OF BREATH Gwenlyn Fudge, FNP Taking Active Self  amLODipine (NORVASC) 5 MG tablet [id number] Yes Take 1 tablet (5 mg total) by mouth daily. Mechele Claude, MD Taking Active Self  apixaban (ELIQUIS) 5 MG TABS tablet [id number] Yes TAKE ONE TABLET BY MOUTH TWICE DAILY Mechele Claude, MD Taking Active Self  dofetilide (TIKOSYN) 500 MCG capsule [id number] Yes Take 1 capsule (500 mcg total) by mouth 2 (two) times daily. Mechele Claude, MD Taking Active Self  escitalopram (LEXAPRO) 20 MG tablet [id number] Yes Take 1 tablet (20 mg total) by mouth daily. Mechele Claude, MD Taking Active Self  FEROSUL 325 (65 Fe) MG tablet [id number] Yes TAKE 1 TABLET DAILY Marguerita Merles, Daniel, MD Taking Active Self  furosemide (LASIX) 20 MG tablet [id number] Yes Take 1 tablet (20 mg total) by mouth daily. Mechele Claude, MD Taking Active Self  gabapentin (NEURONTIN) 300 MG capsule [id number] Yes Take 300 mg by mouth three times daily Deliah Boston F, FNP Taking Active Self  lisinopril (ZESTRIL) 5 MG tablet [id number] Yes Take 1 tablet (5 mg total) by mouth daily. Mechele Claude, MD Taking Active Self  mirtazapine (REMERON) 7.5 MG tablet [id number] Yes Take 1 tablet (7.5 mg total) by mouth at  bedtime. Mechele Claude, MD Taking Active Self  pantoprazole (PROTONIX) 40 MG tablet [id number] Yes Take 1 tablet (40 mg total) by mouth 2 (two) times daily. Mechele Claude, MD Taking Active Self  pravastatin (PRAVACHOL) 40 MG tablet [id number] Yes Take 1 tablet (40 mg total) by mouth daily. Mechele Claude, MD Taking Active Self  sucralfate (CARAFATE) 1 g tablet [id number] Yes Take 1 tablet (1 g total) by mouth 2  (two) times daily. Mechele Claude, MD Taking Active Self  tamsulosin [facility]) 0.4 MG CAPS capsule [id number] Yes Take 1 capsule (0.4 mg total) by mouth daily. Mechele Claude, MD Taking Active Self  vitamin B-12 (CYANOCOBALAMIN) 1000 MCG tablet [id number] Yes Take 1 tablet (1,000 mcg total) by mouth daily. Tyrone Nine, MD Taking Active Self             Assessment/Plan:   Atrial Fibrillation: - Currently controlled per patient report but is having difficulty affording Eliquis - Will send Eliquis prescription to the health department to determine if he is eligible to fill there. Explained to patient there is a 50/50 chance he will be.  - Reviewed importance of adherence to anticoagulant for stroke prevention. - Reviewed appropriate blood pressure monitoring technique and reviewed goal blood pressure. Recommended to check home blood pressure and heart rate daily.   Follow Up Plan:  Pharmacist: 2-4 weeks PCP: July 2024  Valeda Malm, Ilda Basset.D. PGY-2 Ambulatory Care Pharmacy Resident  Kieth Brightly, PharmD, BCACP Clinical Pharmacist, [facility]

## 2022-08-29 DIAGNOSIS — D6869 Other thrombophilia: Secondary | ICD-10-CM | POA: Diagnosis not present

## 2022-08-29 DIAGNOSIS — I252 Old myocardial infarction: Secondary | ICD-10-CM | POA: Diagnosis not present

## 2022-09-20 ENCOUNTER — Other Ambulatory Visit: Payer: Self-pay | Admitting: Family Medicine

## 2022-09-20 DIAGNOSIS — I4819 Other persistent atrial fibrillation: Secondary | ICD-10-CM

## 2022-09-24 ENCOUNTER — Telehealth: Payer: Self-pay | Admitting: Family Medicine

## 2022-09-24 DIAGNOSIS — Z0279 Encounter for issue of other medical certificate: Secondary | ICD-10-CM

## 2022-09-25 NOTE — Telephone Encounter (Signed)
Information completed and forwarded to PCP 

## 2022-09-25 NOTE — Telephone Encounter (Signed)
[employer] faxed FMLA forms to be completed  Form Fee Paid? (Y/N)       YES     If NO, form is placed on front office manager desk to hold until payment received. If YES, then form will be placed in the RX/HH Nurse Coordinators box for completion.  Form will not be processed until payment is received   

## 2022-09-26 IMAGING — US US EXTREM LOW VENOUS*L*
1 series · 13 of 24 positions shown · non-contrast
Comparison: None.

CLINICAL DATA: Fall, bruising, pain



[Series 1: us venous img lower uni left (dvt) · portal-venous · 13 of 48 slices shown]
[im 1/48]
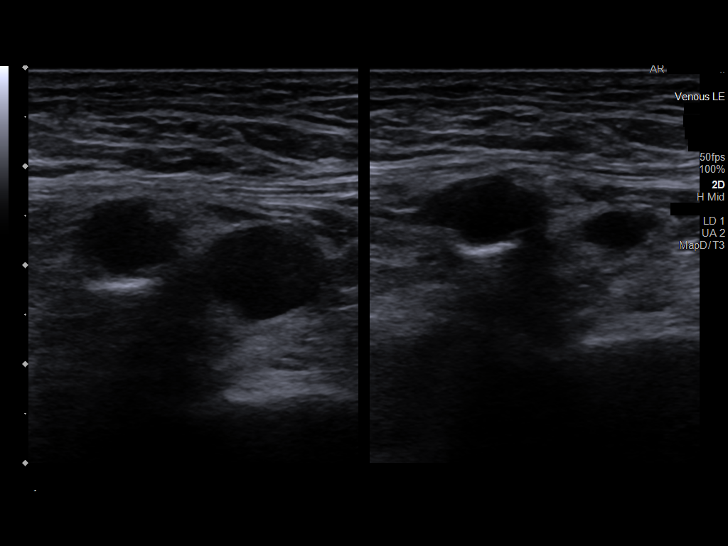
[im 5/48]
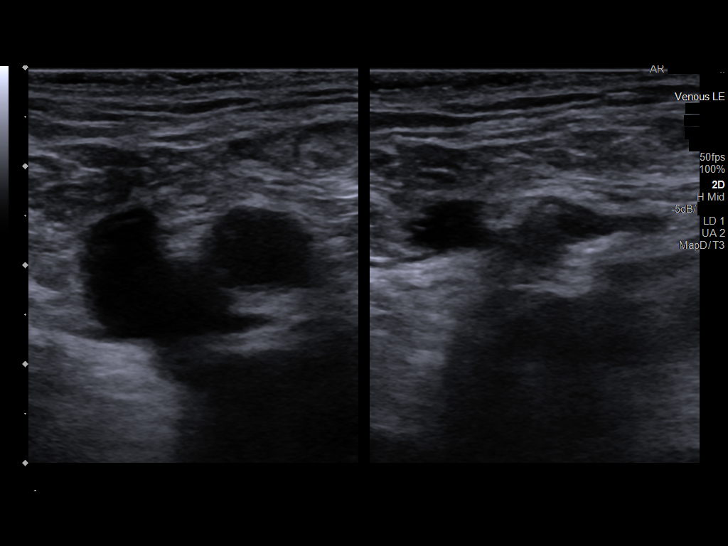
[im 9/48]
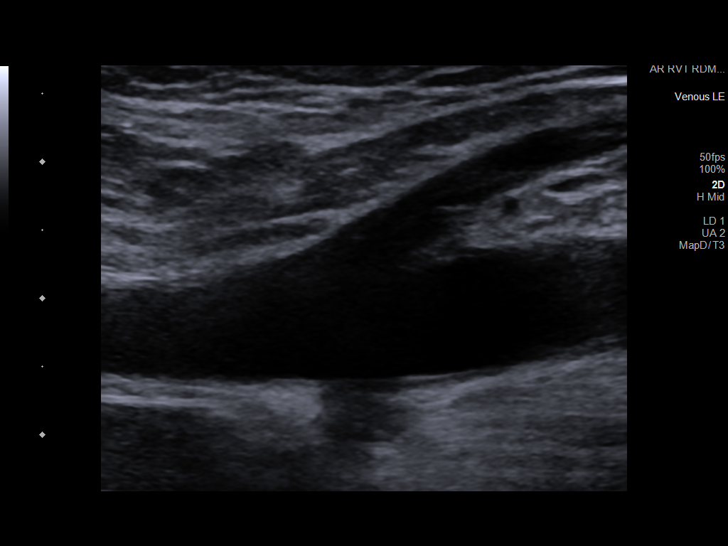
[im 13/48]
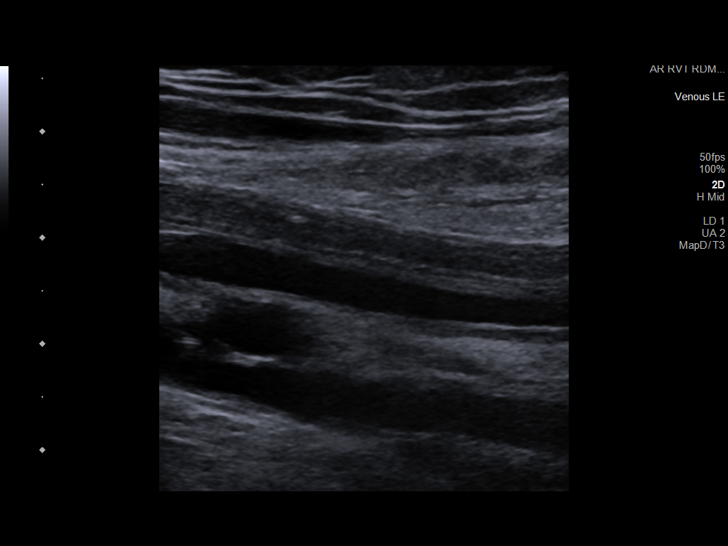
[im 17/48]
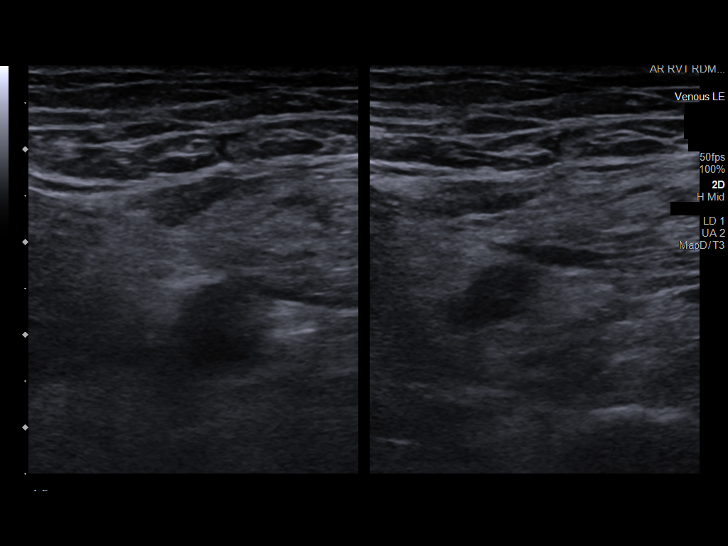
[im 21/48]
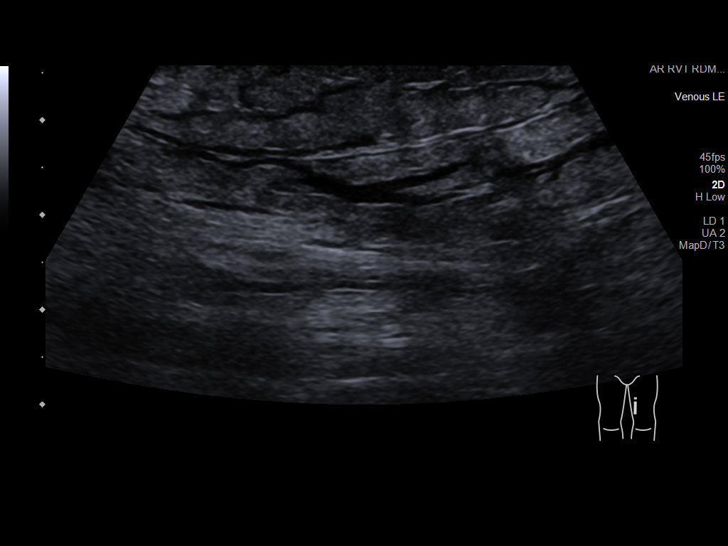
[im 25/48]
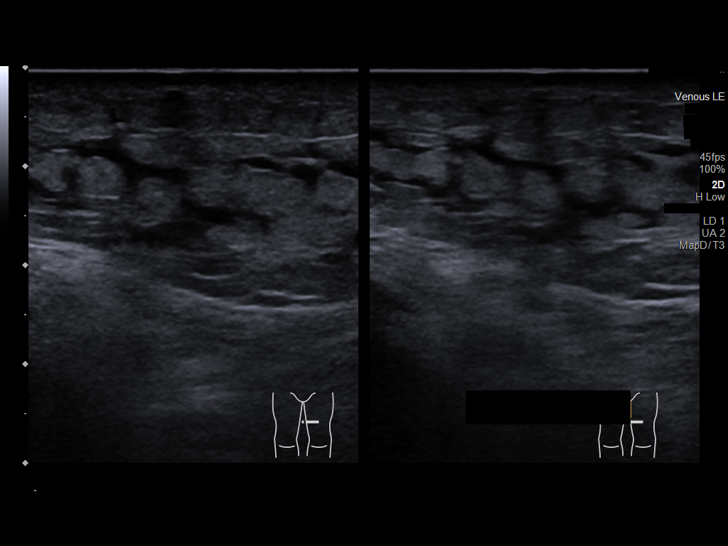
[im 27/48]
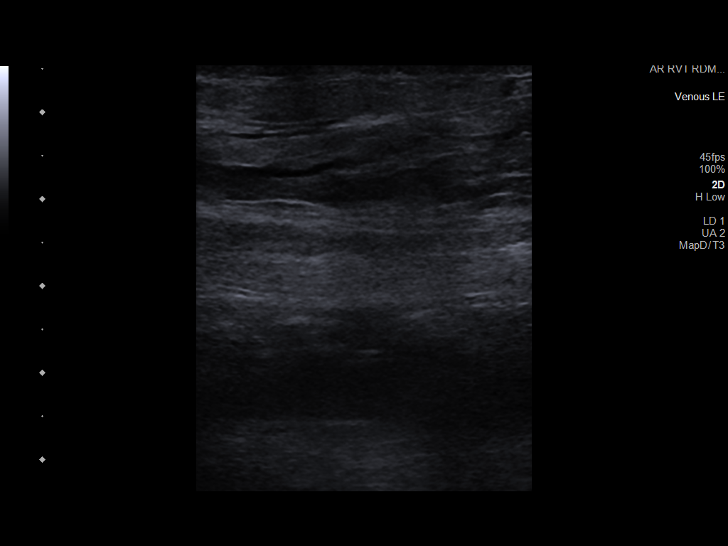
[im 31/48]
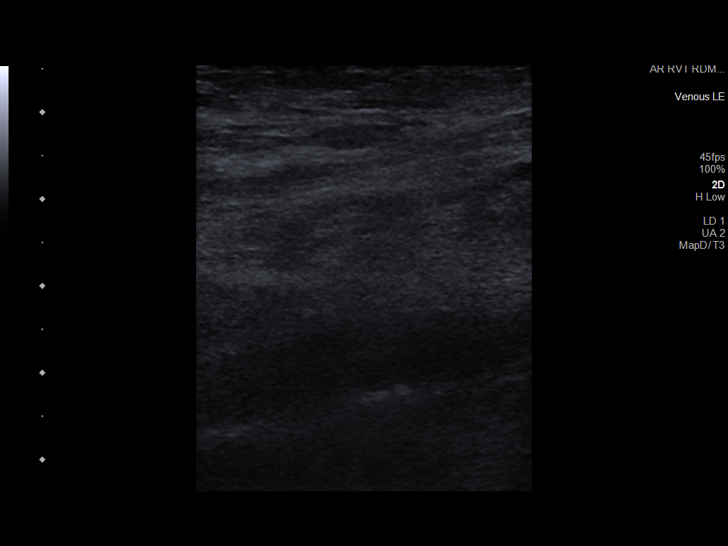
[im 35/48]
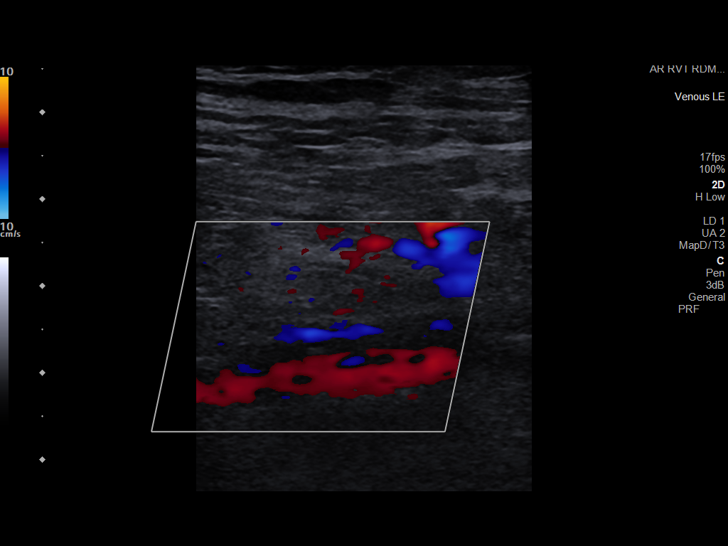
[im 39/48]
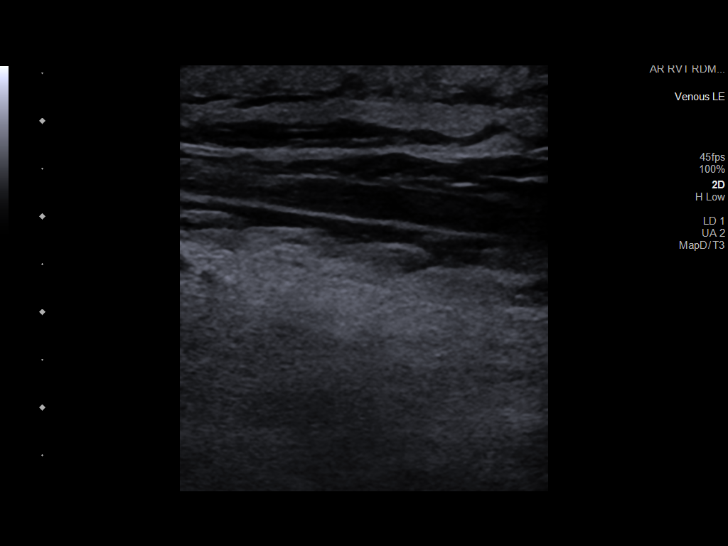
[im 43/48]
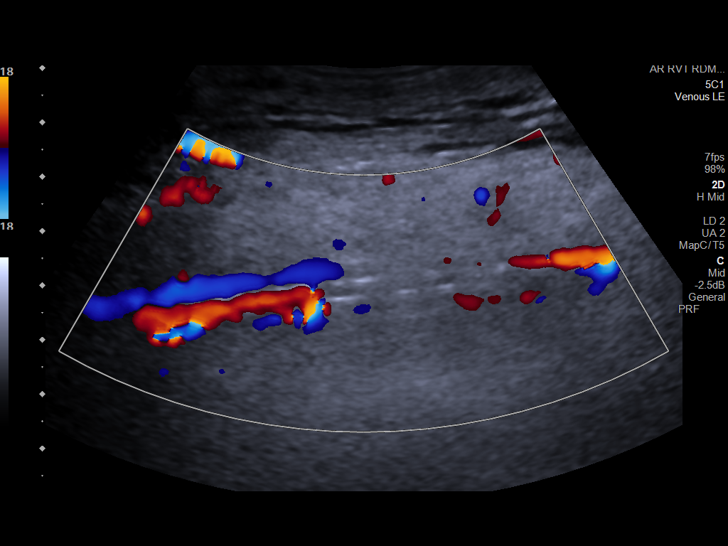
[im 48/48]
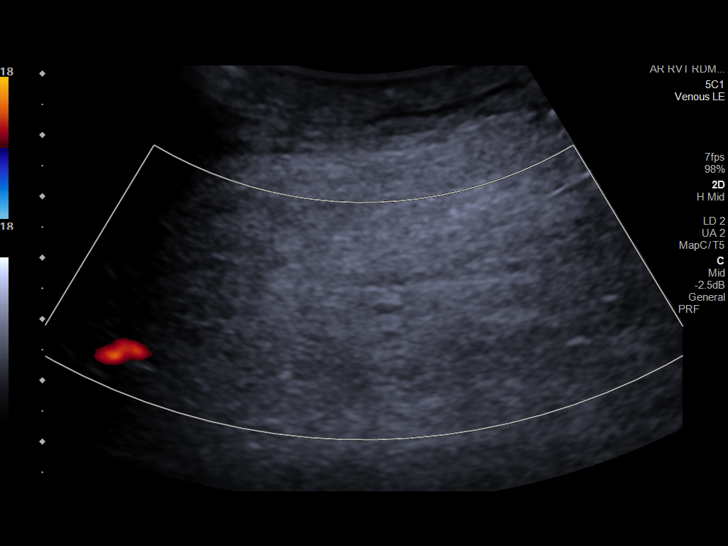

[13 of 24 positions shown; findings below may reference images not displayed]

FINDINGS: Contralateral Common Femoral Vein: Respiratory phasicity is normal
and symmetric with the symptomatic side. No evidence of thrombus.
Normal compressibility.

Common Femoral Vein: No evidence of thrombus. Normal
compressibility, respiratory phasicity and response to augmentation.

Saphenofemoral Junction: No evidence of thrombus. Normal
compressibility and flow on color Doppler imaging.

Profunda Femoral Vein: No evidence of thrombus. Normal
compressibility and flow on color Doppler imaging.

Femoral Vein: No evidence of thrombus. Normal compressibility,
respiratory phasicity and response to augmentation.

Popliteal Vein: No evidence of thrombus. Normal compressibility,
respiratory phasicity and response to augmentation.

Calf Veins: No evidence of thrombus. Normal compressibility and flow
on color Doppler imaging.

Other Findings:  Lower extremity subcutaneous edema noted.
IMPRESSION: No evidence of significant acute deep venous thrombosis. Limited
exam because of extensive lower extremity edema.

## 2022-09-26 NOTE — Telephone Encounter (Signed)
PCP completed and signed FMLA forms. They have been faxed to [facility]  at fax number [phone]. Patient has been contacted and informed they are complete and there is a copy at front desk.

## 2022-10-16 ENCOUNTER — Ambulatory Visit: Payer: Medicare Other | Admitting: Family Medicine

## 2022-10-16 ENCOUNTER — Encounter: Payer: Self-pay | Admitting: Family Medicine

## 2022-10-16 VITALS — BP 130/60 | HR 52 | Temp 97.9°F | Ht 68.0 in | Wt 150.0 lb

## 2022-10-16 DIAGNOSIS — N401 Enlarged prostate with lower urinary tract symptoms: Secondary | ICD-10-CM | POA: Diagnosis not present

## 2022-10-16 DIAGNOSIS — R351 Nocturia: Secondary | ICD-10-CM

## 2022-10-16 DIAGNOSIS — I10 Essential (primary) hypertension: Secondary | ICD-10-CM | POA: Diagnosis not present

## 2022-10-16 DIAGNOSIS — I4819 Other persistent atrial fibrillation: Secondary | ICD-10-CM

## 2022-10-16 DIAGNOSIS — G8222 Paraplegia, incomplete: Secondary | ICD-10-CM | POA: Diagnosis not present

## 2022-10-16 LAB — CBC WITH DIFFERENTIAL/PLATELET
Eos: 6 %
Immature Grans (Abs): 0 10*3/uL (ref 0.0–0.1)
Monocytes: 6 %
RDW: 12.7 % (ref 11.6–15.4)
WBC: 5.2 10*3/uL (ref 3.4–10.8)

## 2022-10-16 LAB — TSH

## 2022-10-16 LAB — CMP14+EGFR
AST: 15 IU/L (ref 0–40)
BUN/Creatinine Ratio: 18 (ref 10–24)
BUN: 12 mg/dL (ref 8–27)
Creatinine, Ser: 0.66 mg/dL — ABNORMAL LOW (ref 0.76–1.27)
Glucose: 86 mg/dL (ref 70–99)

## 2022-10-16 MED ORDER — FINASTERIDE 5 MG PO TABS: 5.0000 mg | ORAL_TABLET | Freq: Every day | ORAL | 3 refills | Status: DC

## 2022-10-16 MED ORDER — ALFUZOSIN HCL ER 10 MG PO TB24
10.0000 mg | ORAL_TABLET | Freq: Every day | ORAL | 1 refills | Status: DC
Start: 2022-10-16 — End: 2023-02-25

## 2022-10-16 NOTE — Progress Notes (Addendum)
Subjective:  Patient ID: Theodore Mendoza, male    DOB: 12/14/1940  Age: 82 y.o. MRN: [id number]  CC: Medical Management of Chronic Issues   HPI Theodore Mendoza presents for  follow-up of hypertension. Patient has no history of headache chest pain or shortness of breath or recent cough. Patient also denies symptoms of TIA such as focal numbness or weakness. Patient denies side effects from medication. States taking it regularly.  Up 4 times a night to bathroom - WC bound ( due to [employer] induced paraplegia), can do transfers, but losing a lot of sleep. Taking Tamsulosin 0.4 mg qhs. Energy not good. Wants to sleep all the time  Atrial fibrillation follow up. Pt. is treated with rate control and anticoagulation. Pt.  denies palpitations, rapid rate, chest pain, dyspnea and edema. There has been no bleeding from nose or gums. Pt. has not noticed blood with urine or stool.  Although there is routine bruising easily, it is not excessive.    History Theodore Mendoza has a past medical history of AAA (abdominal aortic aneurysm) (HCC), Atrial fibrillation (HCC), Brain tumor (HCC), Cancer (HCC), Cardiac arrhythmia, CHF (congestive heart failure) (HCC), GERD (gastroesophageal reflux disease), Guillain-Barre syndrome (HCC) (02/13/1986), Headache, Hypertension, Kidney stones, and Myocardial infarction (HCC) (2007).   He has a past surgical history that includes Cystoscopy with stent placement (Left, 05/08/2014); Coronary angioplasty (1997); Eye surgery (Right, may 2015); gamma kniferadiation treatment (Feb 09 2014); Lithotripsy (years ago); Cystoscopy with retrograde pyelogram, ureteroscopy and stent placement (Left, 06/28/2014); Holmium laser application (Left, 06/28/2014); Stone extraction with basket (Left, 06/28/2014); Esophagogastroduodenoscopy (egd) with propofol (N/A, 12/16/2019); Hemostasis clip placement (12/16/2019); Hemostasis control (12/16/2019); polypectomy (12/16/2019); Esophagogastroduodenoscopy (egd) with  propofol (N/A, 08/13/2020); Colonoscopy with propofol (N/A, 08/14/2020); Esophagogastroduodenoscopy (egd) with propofol (N/A, 09/16/2020); enteroscopy (N/A, 09/16/2020); Givens capsule study (N/A, 10/11/2020); Esophagogastroduodenoscopy (egd) with propofol (N/A, 10/25/2020); enteroscopy (N/A, 10/25/2020); and Abdominal aortic endovascular stent graft (Bilateral, 04/04/2022).   His family history includes CAD in his father; Diabetes in his brother, mother, and sister; Lung cancer in his mother; Other in his maternal grandfather; Stroke in his sister.He reports that he has never smoked. He has never been exposed to tobacco smoke. He has never used smokeless tobacco. He reports that he does not drink alcohol and does not use drugs.  Current Outpatient Medications on File Prior to Visit  Medication Sig Dispense Refill   albuterol (VENTOLIN HFA) 108 (90 Base) MCG/ACT inhaler INHALE 2 PUFFS INTO THE LUNGS EVERY 6 HOURS AS NEEDED FOR WHEEZING OR SHORTNESS OF BREATH 8.5 g 1   amLODipine (NORVASC) 5 MG tablet Take 1 tablet (5 mg total) by mouth daily. 90 tablet 3   dofetilide (TIKOSYN) 500 MCG capsule Take 1 capsule (500 mcg total) by mouth 2 (two) times daily. 180 capsule 3   ELIQUIS 5 MG TABS tablet TAKE ONE TABLET BY MOUTH TWICE DAILY 180 tablet 0   escitalopram (LEXAPRO) 20 MG tablet Take 1 tablet (20 mg total) by mouth daily. 90 tablet 3   FEROSUL 325 (65 Fe) MG tablet TAKE 1 TABLET DAILY 90 tablet 0   furosemide (LASIX) 20 MG tablet Take 1 tablet (20 mg total) by mouth daily. 90 tablet 3   gabapentin (NEURONTIN) 300 MG capsule Take 300 mg by mouth three times daily 360 capsule 1   lisinopril (ZESTRIL) 5 MG tablet Take 1 tablet (5 mg total) by mouth daily. 90 tablet 3   mirtazapine (REMERON) 7.5 MG tablet Take 1 tablet (7.5 mg total) by  mouth at bedtime. 90 tablet 3   pantoprazole (PROTONIX) 40 MG tablet Take 1 tablet (40 mg total) by mouth 2 (two) times daily. 180 tablet 3   pravastatin (PRAVACHOL) 40 MG tablet  Take 1 tablet (40 mg total) by mouth daily. 90 tablet 3   sucralfate (CARAFATE) 1 g tablet Take 1 tablet (1 g total) by mouth 2 (two) times daily. 180 tablet 3   vitamin B-12 (CYANOCOBALAMIN) 1000 MCG tablet Take 1 tablet (1,000 mcg total) by mouth daily. 30 tablet 2   No current facility-administered medications on file prior to visit.    ROS Review of Systems  Constitutional:  Negative for fever.  Respiratory:  Negative for shortness of breath.   Cardiovascular:  Negative for chest pain.  Genitourinary:  Positive for difficulty urinating and frequency.  Musculoskeletal:  Positive for gait problem. Negative for arthralgias.  Skin:  Negative for rash.    Objective:  BP 130/60   Pulse (!) 52   Temp 97.9 F (36.6 C)   Ht 5\' 8"  (1.727 m)   Wt 150 lb (68 kg) Comment: PATIENT REPORTS  SpO2 98%   BMI 22.81 kg/m   BP Readings from Last 3 Encounters:  10/16/22 130/60  08/03/22 (!) 144/65  07/16/22 139/62    Wt Readings from Last 3 Encounters:  10/16/22 150 lb (68 kg)  08/15/22 150 lb (68 kg)  08/01/22 154 lb 15.7 oz (70.3 kg)     Physical Exam Vitals reviewed.  Constitutional:      Appearance: He is well-developed.  HENT:     Head: Normocephalic and atraumatic.     Right Ear: External ear normal.     Left Ear: External ear normal.     Mouth/Throat:     Pharynx: No oropharyngeal exudate or posterior oropharyngeal erythema.  Eyes:     Pupils: Pupils are equal, round, and reactive to light.  Cardiovascular:     Rate and Rhythm: Normal rate and regular rhythm.     Heart sounds: No murmur heard. Pulmonary:     Effort: No respiratory distress.     Breath sounds: Normal breath sounds.  Abdominal:     General: Abdomen is flat.     Palpations: Abdomen is soft.  Musculoskeletal:     Cervical back: Normal range of motion and neck supple.     Comments: WC bound  Neurological:     Mental Status: He is alert and oriented to person, place, and time.       Assessment &  Plan:   Theodore Mendoza was seen today for medical management of chronic issues.  Diagnoses and all orders for this visit:  Benign prostatic hyperplasia with nocturia -     CBC with Differential/Platelet -     CMP14+EGFR -     TSH  Paraplegia, incomplete (HCC)  Essential (primary) hypertension -     CBC with Differential/Platelet -     CMP14+EGFR -     TSH  Persistent atrial fibrillation (HCC) -     CBC with Differential/Platelet -     CMP14+EGFR -     TSH  Other orders -     alfuzosin (UROXATRAL) 10 MG 24 hr tablet; Take 1 tablet (10 mg total) by mouth daily with breakfast. -     finasteride (PROSCAR) 5 MG tablet; Take 1 tablet (5 mg total) by mouth daily. For urine flow   Allergies as of 10/16/2022       Reactions   Valsartan Rash  Morphine And Codeine Other (See Comments)   Reaction:  Confusion/weakness/hallucinations        Medication List        Accurate as of October 16, 2022  5:55 PM. If you have any questions, ask your nurse or doctor.          STOP taking these medications    tamsulosin 0.4 MG Caps capsule Commonly known as: FLOMAX Stopped by: Azael Ragain       TAKE these medications    albuterol 108 (90 Base) MCG/ACT inhaler Commonly known as: VENTOLIN HFA INHALE 2 PUFFS INTO THE LUNGS EVERY 6 HOURS AS NEEDED FOR WHEEZING OR SHORTNESS OF BREATH   alfuzosin 10 MG 24 hr tablet Commonly known as: UROXATRAL Take 1 tablet (10 mg total) by mouth daily with breakfast. Started by: Noell Shular   amLODipine 5 MG tablet Commonly known as: NORVASC Take 1 tablet (5 mg total) by mouth daily.   cyanocobalamin 1000 MCG tablet Commonly known as: VITAMIN B12 Take 1 tablet (1,000 mcg total) by mouth daily.   dofetilide 500 MCG capsule Commonly known as: TIKOSYN Take 1 capsule (500 mcg total) by mouth 2 (two) times daily.   Eliquis 5 MG Tabs tablet Generic drug: apixaban TAKE ONE TABLET BY MOUTH TWICE DAILY   escitalopram 20 MG tablet Commonly known  as: LEXAPRO Take 1 tablet (20 mg total) by mouth daily.   FeroSul 325 (65 Fe) MG tablet Generic drug: ferrous sulfate TAKE 1 TABLET DAILY   finasteride 5 MG tablet Commonly known as: Proscar Take 1 tablet (5 mg total) by mouth daily. For urine flow Started by: Jaquel Coomer   furosemide 20 MG tablet Commonly known as: LASIX Take 1 tablet (20 mg total) by mouth daily.   gabapentin 300 MG capsule Commonly known as: NEURONTIN Take 300 mg by mouth three times daily   lisinopril 5 MG tablet Commonly known as: ZESTRIL Take 1 tablet (5 mg total) by mouth daily.   mirtazapine 7.5 MG tablet Commonly known as: REMERON Take 1 tablet (7.5 mg total) by mouth at bedtime.   pantoprazole 40 MG tablet Commonly known as: PROTONIX Take 1 tablet (40 mg total) by mouth 2 (two) times daily.   pravastatin 40 MG tablet Commonly known as: PRAVACHOL Take 1 tablet (40 mg total) by mouth daily.   sucralfate 1 g tablet Commonly known as: CARAFATE Take 1 tablet (1 g total) by mouth 2 (two) times daily.        Meds ordered this encounter  Medications   alfuzosin (UROXATRAL) 10 MG 24 hr tablet    Sig: Take 1 tablet (10 mg total) by mouth daily with breakfast.    Dispense:  90 tablet    Refill:  1    DC Tamsulosin, please   finasteride (PROSCAR) 5 MG tablet    Sig: Take 1 tablet (5 mg total) by mouth daily. For urine flow    Dispense:  90 tablet    Refill:  3      Follow-up: Return in about 3 months (around 01/16/2023).  Mechele Claude, M.D.

## 2022-10-17 LAB — CMP14+EGFR
ALT: 8 IU/L (ref 0–44)
Albumin: 3.7 g/dL (ref 3.7–4.7)
Alkaline Phosphatase: 69 IU/L (ref 44–121)
Bilirubin Total: 0.6 mg/dL (ref 0.0–1.2)
CO2: 31 mmol/L — ABNORMAL HIGH (ref 20–29)
Calcium: 8.9 mg/dL (ref 8.6–10.2)
Chloride: 100 mmol/L (ref 96–106)
Globulin, Total: 1.7 g/dL (ref 1.5–4.5)
Potassium: 3.7 mmol/L (ref 3.5–5.2)
Sodium: 145 mmol/L — ABNORMAL HIGH (ref 134–144)
Total Protein: 5.4 g/dL — ABNORMAL LOW (ref 6.0–8.5)
eGFR: 92 mL/min/{1.73_m2} (ref >59)

## 2022-10-17 LAB — CBC WITH DIFFERENTIAL/PLATELET
Basophils Absolute: 0.1 10*3/uL (ref 0.0–0.2)
Basos: 1 %
EOS (ABSOLUTE): 0.3 10*3/uL (ref 0.0–0.4)
Hematocrit: 35.3 % — ABNORMAL LOW (ref 37.5–51.0)
Hemoglobin: 11.3 g/dL — ABNORMAL LOW (ref 13.0–17.7)
Immature Granulocytes: 0 %
Lymphocytes Absolute: 1 10*3/uL (ref 0.7–3.1)
Lymphs: 19 %
MCH: 29.3 pg (ref 26.6–33.0)
MCHC: 32 g/dL (ref 31.5–35.7)
MCV: 92 fL (ref 79–97)
Monocytes Absolute: 0.3 10*3/uL (ref 0.1–0.9)
Neutrophils Absolute: 3.5 10*3/uL (ref 1.4–7.0)
Neutrophils: 68 %
Platelets: 159 10*3/uL (ref 150–450)
RBC: 3.86 x10E6/uL — ABNORMAL LOW (ref 4.14–5.80)

## 2022-10-17 NOTE — Progress Notes (Signed)
Hello Theodore Mendoza,  Your lab result is normal and/or stable.Some minor variations that are not significant are commonly marked abnormal, but do not represent any medical problem for you.  Best regards, Warren Stacks, M.D.

## 2022-10-29 DIAGNOSIS — D429 Neoplasm of uncertain behavior of meninges, unspecified: Secondary | ICD-10-CM | POA: Diagnosis not present

## 2022-10-29 DIAGNOSIS — D329 Benign neoplasm of meninges, unspecified: Secondary | ICD-10-CM | POA: Diagnosis not present

## 2022-10-29 DIAGNOSIS — Z923 Personal history of irradiation: Secondary | ICD-10-CM | POA: Insufficient documentation

## 2022-10-30 ENCOUNTER — Other Ambulatory Visit: Payer: Self-pay | Admitting: Family Medicine

## 2022-10-30 DIAGNOSIS — G610 Guillain-Barre syndrome: Secondary | ICD-10-CM

## 2022-10-30 DIAGNOSIS — G609 Hereditary and idiopathic neuropathy, unspecified: Secondary | ICD-10-CM

## 2022-11-19 ENCOUNTER — Ambulatory Visit: Payer: Medicare Other | Admitting: Surgery

## 2022-11-19 ENCOUNTER — Encounter: Payer: Self-pay | Admitting: Surgery

## 2022-11-19 ENCOUNTER — Ambulatory Visit (HOSPITAL_COMMUNITY)
Admission: RE | Admit: 2022-11-19 | Discharge: 2022-11-19 | Disposition: A | Discharge status: Home / Self Care | Payer: Medicare Other | Source: Ambulatory Visit | Attending: Surgery | Admitting: Surgery

## 2022-11-19 VITALS — BP 130/67 | HR 59 | Temp 97.7°F | Resp 20 | Ht 68.0 in

## 2022-11-19 DIAGNOSIS — I7143 Infrarenal abdominal aortic aneurysm, without rupture: Secondary | ICD-10-CM | POA: Insufficient documentation

## 2022-11-19 NOTE — Progress Notes (Signed)
[facility]  Patient name: Theodore Mendoza MRN: [id number] DOB: [birth year] Sex: male   REASON FOR VISIT:    Follow up  [name] is a 82 y.o. male who is status post endovascular repair of a 5.3 cm infrarenal abdominal aortic aneurysm on 04/04/2022.  His postoperative course was uncomplicated and he was discharged home on postoperative day 1.  His first postoperative CT scan showed a small type II endoleak.  He has no complaints today.   Patient has a history of coronary artery disease status post PCI and 2008 for a NSTEMI.  He is on anticoagulation for atrial fibrillation.  He has undergone cardioversion.  He also has a history of TIA in 2017.  He was diagnosed with Guillain-Barr syndrome in 1987, and has been wheelchair-bound.  He has a meningioma that is being followed by neurosurgery.   PAST MEDICAL HISTORY:   Past Medical History:  Diagnosis Date   AAA (abdominal aortic aneurysm) (HCC)    Atrial fibrillation (HCC)    Brain tumor (HCC)    x2 (benign per pt)   Cancer (HCC)    melanoma removed twice (head and right forearm)   Cardiac arrhythmia    CHF (congestive heart failure) (HCC)    GERD (gastroesophageal reflux disease)    Guillain-Barre syndrome (HCC) 02/13/1986   Headache    Hypertension    Kidney stones    Myocardial infarction [facility]) 2007     FAMILY HISTORY:   Family History  Problem Relation Age of Onset   Diabetes Mother    Lung cancer Mother    CAD Father    Diabetes Brother    Diabetes Sister    Stroke Sister    Other Maternal Grandfather        brain tumor   Colon cancer Neg Hx    Pancreatic cancer Neg Hx    Esophageal cancer Neg Hx     SOCIAL HISTORY:   Social History   Tobacco Use   Smoking status: Never    Passive exposure: Never   Smokeless tobacco: Never  Substance Use Topics   Alcohol use: No     ALLERGIES:   Allergies   Allergen Reactions   Valsartan Rash   Morphine And Codeine Other (See Comments)    Reaction:  Confusion/weakness/hallucinations     CURRENT MEDICATIONS:   Current Outpatient Medications  Medication Sig Dispense Refill   albuterol (VENTOLIN HFA) 108 (90 Base) MCG/ACT inhaler INHALE 2 PUFFS INTO THE LUNGS EVERY 6 HOURS AS NEEDED FOR WHEEZING OR SHORTNESS OF BREATH 8.5 g 1   alfuzosin (UROXATRAL) 10 MG 24 hr tablet Take 1 tablet (10 mg total) by mouth daily with breakfast. 90 tablet 1   amLODipine (NORVASC) 5 MG tablet Take 1 tablet (5 mg total) by mouth daily. 90 tablet 3   dofetilide (TIKOSYN) 500 MCG capsule Take 1 capsule (500 mcg total) by mouth 2 (two) times daily. 180 capsule 3   ELIQUIS 5 MG TABS tablet TAKE ONE TABLET BY MOUTH TWICE DAILY 180 tablet 0   escitalopram (LEXAPRO) 20 MG tablet Take 1 tablet (20 mg total) by mouth daily. 90 tablet 3   FEROSUL 325 (65 Fe) MG tablet TAKE 1 TABLET DAILY 90 tablet 0   finasteride (PROSCAR) 5 MG tablet Take 1 tablet (5 mg total) by mouth daily. For urine flow 90 tablet 3   furosemide (LASIX) 20 MG tablet Take 1 tablet (  20 mg total) by mouth daily. 90 tablet 3   gabapentin (NEURONTIN) 300 MG capsule TAKE 1 CAPSULE BY MOUTH 3 TIMES A DAY 360 capsule 0   lisinopril (ZESTRIL) 5 MG tablet Take 1 tablet (5 mg total) by mouth daily. 90 tablet 3   mirtazapine (REMERON) 7.5 MG tablet Take 1 tablet (7.5 mg total) by mouth at bedtime. 90 tablet 3   pantoprazole (PROTONIX) 40 MG tablet Take 1 tablet (40 mg total) by mouth 2 (two) times daily. 180 tablet 3   pravastatin (PRAVACHOL) 40 MG tablet Take 1 tablet (40 mg total) by mouth daily. 90 tablet 3   sucralfate (CARAFATE) 1 g tablet Take 1 tablet (1 g total) by mouth 2 (two) times daily. 180 tablet 3   vitamin B-12 (CYANOCOBALAMIN) 1000 MCG tablet Take 1 tablet (1,000 mcg total) by mouth daily. 30 tablet 2   No current facility-administered medications for this visit.    REVIEW OF SYSTEMS:   [X]   denotes positive finding, [ ]  denotes negative finding Cardiac  Comments:  Chest pain or chest pressure:    Shortness of breath upon exertion:    Short of breath when lying flat:    Irregular heart rhythm:        Vascular    Pain in calf, thigh, or hip brought on by ambulation:    Pain in feet at night that wakes you up from your sleep:     Blood clot in your veins:    Leg swelling:         Pulmonary    Oxygen at home:    Productive cough:     Wheezing:         Neurologic    Sudden weakness in arms or legs:     Sudden numbness in arms or legs:     Sudden onset of difficulty speaking or slurred speech:    Temporary loss of vision in one eye:     Problems with dizziness:         Gastrointestinal    Blood in stool:     Vomited blood:         Genitourinary    Burning when urinating:     Blood in urine:        Psychiatric    Major depression:         Hematologic    Bleeding problems:    Problems with blood clotting too easily:        Skin    Rashes or ulcers:        Constitutional    Fever or chills:      PHYSICAL EXAM:   Vitals:   11/19/22 0947  BP: 130/67  Pulse: (!) 59  Resp: 20  Temp: 97.7 F (36.5 C)  SpO2: 98%  Height: 5\' 8"  (1.727 m)    GENERAL: The patient is a well-nourished male, in no acute distress. The vital signs are documented above. CARDIAC: There is a regular rate and rhythm.  PULMONARY: Non-labored respirations ABDOMEN: Soft and non-tender  MUSCULOSKELETAL: There are no major deformities or cyanosis. NEUROLOGIC: No focal weakness or paresthesias are detected. SKIN: There are no ulcers or rashes noted. PSYCHIATRIC: The patient has a normal affect.  STUDIES:   I have reviewed his ultrasound with the following findings: Abdominal Aorta: Patent endovascular aneurysm repair with evidence of type  2 endoleak (lumbar and IMA). The largest aortic diameter remains  essentially unchanged compared to prior exam. Previous diameter  measurement was 5.1 cm on CT angio obtained on  05/10/2022.   MEDICAL ISSUES:   AAA: Maximum diameter remains 5.1 cm with a persistent type II endoleak.  I discussed the significance of the endoleak and the treatment options.  For now we will continue with observation via ultrasound surveillance.  He will return in 6 months.   Charlena Cross, MD, FACS [facility] [phone] Pager [phone]

## 2022-12-10 ENCOUNTER — Other Ambulatory Visit: Payer: Self-pay

## 2022-12-10 DIAGNOSIS — I7143 Infrarenal abdominal aortic aneurysm, without rupture: Secondary | ICD-10-CM

## 2022-12-10 NOTE — Addendum Note (Signed)
Addended by: Leilani Able, Farron Watrous A on: 12/10/2022 08:31 AM   Modules accepted: Orders

## 2022-12-17 ENCOUNTER — Encounter (HOSPITAL_COMMUNITY): Payer: Self-pay | Admitting: *Deleted

## 2022-12-17 ENCOUNTER — Other Ambulatory Visit: Payer: Self-pay

## 2022-12-17 ENCOUNTER — Emergency Department (HOSPITAL_COMMUNITY)
Admission: EM | Admit: 2022-12-17 | Discharge: 2022-12-17 | Disposition: A | Discharge status: Home / Self Care | Payer: Medicare Other | Attending: Emergency Medicine | Admitting: Emergency Medicine

## 2022-12-17 DIAGNOSIS — N3001 Acute cystitis with hematuria: Secondary | ICD-10-CM | POA: Diagnosis not present

## 2022-12-17 DIAGNOSIS — E876 Hypokalemia: Secondary | ICD-10-CM | POA: Insufficient documentation

## 2022-12-17 DIAGNOSIS — D649 Anemia, unspecified: Secondary | ICD-10-CM | POA: Insufficient documentation

## 2022-12-17 DIAGNOSIS — Z7901 Long term (current) use of anticoagulants: Secondary | ICD-10-CM | POA: Diagnosis not present

## 2022-12-17 DIAGNOSIS — R350 Frequency of micturition: Secondary | ICD-10-CM | POA: Diagnosis not present

## 2022-12-17 LAB — BASIC METABOLIC PANEL
Anion gap: 12 (ref 5–15)
BUN: 13 mg/dL (ref 8–23)
CO2: 30 mmol/L (ref 22–32)
Calcium: 9.1 mg/dL (ref 8.9–10.3)
Chloride: 102 mmol/L (ref 98–111)
Creatinine, Ser: 0.81 mg/dL (ref 0.61–1.24)
GFR, Estimated: >60 mL/min (ref >60)
Glucose, Bld: 115 mg/dL — ABNORMAL HIGH (ref 70–99)
Potassium: 3.2 mmol/L — ABNORMAL LOW (ref 3.5–5.1)
Sodium: 144 mmol/L (ref 135–145)

## 2022-12-17 LAB — URINALYSIS, ROUTINE W REFLEX MICROSCOPIC
Bilirubin Urine: NEGATIVE
Glucose, UA: NEGATIVE mg/dL
Ketones, ur: 5 mg/dL — AB
Nitrite: NEGATIVE
Protein, ur: 100 mg/dL — AB
Specific Gravity, Urine: 1.021 (ref 1.005–1.030)
WBC, UA: >50 WBC/hpf (ref 0–5)
pH: 5 (ref 5.0–8.0)

## 2022-12-17 LAB — CBC WITH DIFFERENTIAL/PLATELET
Abs Immature Granulocytes: 0.04 10*3/uL (ref 0.00–0.07)
Basophils Absolute: 0.1 10*3/uL (ref 0.0–0.1)
Basophils Relative: 1 %
Eosinophils Absolute: 0.1 10*3/uL (ref 0.0–0.5)
Eosinophils Relative: 1 %
HCT: 38.5 % — ABNORMAL LOW (ref 39.0–52.0)
Hemoglobin: 12 g/dL — ABNORMAL LOW (ref 13.0–17.0)
Immature Granulocytes: 1 %
Lymphocytes Relative: 8 %
Lymphs Abs: 0.6 10*3/uL — ABNORMAL LOW (ref 0.7–4.0)
MCH: 28.7 pg (ref 26.0–34.0)
MCHC: 31.2 g/dL (ref 30.0–36.0)
MCV: 92.1 fL (ref 80.0–100.0)
Monocytes Absolute: 0.3 10*3/uL (ref 0.1–1.0)
Monocytes Relative: 4 %
Neutro Abs: 6.6 10*3/uL (ref 1.7–7.7)
Neutrophils Relative %: 85 %
Platelets: 166 10*3/uL (ref 150–400)
RBC: 4.18 MIL/uL — ABNORMAL LOW (ref 4.22–5.81)
RDW: 14.1 % (ref 11.5–15.5)
WBC: 7.7 10*3/uL (ref 4.0–10.5)
nRBC: 0 % (ref 0.0–0.2)

## 2022-12-17 MED ORDER — CEFPODOXIME PROXETIL 200 MG PO TABS: 200.0000 mg | ORAL_TABLET | Freq: Two times a day (BID) | ORAL | 0 refills | Status: AC

## 2022-12-17 MED ORDER — CEFDINIR 300 MG PO CAPS
300.0000 mg | ORAL_CAPSULE | Freq: Two times a day (BID) | ORAL | Status: DC
  Administered 2022-12-17: 300 mg via ORAL
  Filled 2022-12-17: qty 1

## 2022-12-17 MED ORDER — POTASSIUM CHLORIDE CRYS ER 20 MEQ PO TBCR
40.0000 meq | EXTENDED_RELEASE_TABLET | Freq: Once | ORAL | Status: AC
  Administered 2022-12-17: 40 meq via ORAL
  Filled 2022-12-17: qty 2

## 2022-12-17 MED ORDER — ONDANSETRON 4 MG PO TBDP
4.0000 mg | ORAL_TABLET | Freq: Once | ORAL | Status: AC
  Administered 2022-12-17: 4 mg via ORAL
  Filled 2022-12-17: qty 1

## 2022-12-17 NOTE — ED Notes (Signed)
Dc instructions and scripts reviewed with pt and spouse no questions at this time. Will follow up with pcp.

## 2022-12-17 NOTE — Discharge Instructions (Addendum)
It was a pleasure taking care of you today.  You were seen for frequent urination.  Your urine sample suggest you have a urinary tract infection.  We are treating you with antibiotics.  Your potassium was also slightly low so we gave you a dose of potassium here as well.  Make sure you eat foods that are high in potassium such as bananas and orange juice/oranges. Please follow-up closely with your primary care doctor.  Come back to the ER if you have fever, back or abdominal pain, or unable to urinate or have any other worrisome changes.

## 2022-12-17 NOTE — ED Triage Notes (Signed)
Pt states for the last 2 nights he has had urinary frequency and nausea  Pt denies any burning with urination

## 2022-12-17 NOTE — ED Provider Notes (Signed)
[place] EMERGENCY DEPARTMENT AT [facility] Provider Note   CSN: [id number] Arrival date & time: 12/17/22  0946     History  Chief Complaint  Patient presents with   Urinary Frequency    Theodore Mendoza is a 82 y.o. male.  The ER for urinating small amounts about every hour for the past approximately 12 hours, also having nausea now.  No vomiting, no abdominal pain.  No fevers or chills.  He states he feels like he has to urinate very badly he is able to urinate a small amount only.  He has history of Gamber a syndrome and is nonambulatory, uses a wheelchair for mobility, also has meningiomas followed by neurosurgery.  Is on Eliquis for atrial fibrillation.   Urinary Frequency       Home Medications Prior to Admission medications   Medication Sig Start Date End Date Taking? Authorizing Provider  cefpodoxime (VANTIN) 200 MG tablet Take 1 tablet (200 mg total) by mouth 2 (two) times daily for 7 days. 12/17/22 12/24/22 Yes Spring San A, PA-C  albuterol (VENTOLIN HFA) 108 (90 Base) MCG/ACT inhaler INHALE 2 PUFFS INTO THE LUNGS EVERY 6 HOURS AS NEEDED FOR WHEEZING OR SHORTNESS OF BREATH 07/14/21   Gwenlyn Fudge, FNP  alfuzosin (UROXATRAL) 10 MG 24 hr tablet Take 1 tablet (10 mg total) by mouth daily with breakfast. 10/16/22   Mechele Claude, MD  amLODipine (NORVASC) 5 MG tablet Take 1 tablet (5 mg total) by mouth daily. 01/11/22   Mechele Claude, MD  dofetilide (TIKOSYN) 500 MCG capsule Take 1 capsule (500 mcg total) by mouth 2 (two) times daily. 07/16/22   Mechele Claude, MD  ELIQUIS 5 MG TABS tablet TAKE ONE TABLET BY MOUTH TWICE DAILY 09/20/22   Mechele Claude, MD  escitalopram (LEXAPRO) 20 MG tablet Take 1 tablet (20 mg total) by mouth daily. 01/11/22   Mechele Claude, MD  FEROSUL 325 (65 Fe) MG tablet TAKE 1 TABLET DAILY 10/12/21   Marguerita Merles, Reuel Boom, MD  finasteride (PROSCAR) 5 MG tablet Take 1 tablet (5 mg total) by mouth daily. For urine flow 10/16/22    Mechele Claude, MD  furosemide (LASIX) 20 MG tablet Take 1 tablet (20 mg total) by mouth daily. 01/11/22   Mechele Claude, MD  gabapentin (NEURONTIN) 300 MG capsule TAKE 1 CAPSULE BY MOUTH 3 TIMES A DAY 10/30/22   Mechele Claude, MD  lisinopril (ZESTRIL) 5 MG tablet Take 1 tablet (5 mg total) by mouth daily. 01/11/22   Mechele Claude, MD  mirtazapine (REMERON) 7.5 MG tablet Take 1 tablet (7.5 mg total) by mouth at bedtime. 01/11/22   Mechele Claude, MD  pantoprazole (PROTONIX) 40 MG tablet Take 1 tablet (40 mg total) by mouth 2 (two) times daily. 01/11/22   Mechele Claude, MD  pravastatin (PRAVACHOL) 40 MG tablet Take 1 tablet (40 mg total) by mouth daily. 01/11/22   Mechele Claude, MD  sucralfate (CARAFATE) 1 g tablet Take 1 tablet (1 g total) by mouth 2 (two) times daily. 01/11/22   Mechele Claude, MD  vitamin B-12 (CYANOCOBALAMIN) 1000 MCG tablet Take 1 tablet (1,000 mcg total) by mouth daily. 12/18/19   Tyrone Nine, MD      Allergies    Valsartan and Morphine and codeine    Review of Systems   Review of Systems  Genitourinary:  Positive for frequency.    Physical Exam Updated Vital Signs BP (!) 146/76 (BP Location: Right Arm)   Pulse 76   Temp  98.5 F (36.9 C) (Oral)   Resp 20   Ht 5\' 8"  (1.727 m)   Wt 68 kg   SpO2 99%   BMI 22.81 kg/m  Physical Exam Vitals and nursing note reviewed.  Constitutional:      General: He is not in acute distress.    Appearance: He is well-developed.  HENT:     Head: Normocephalic and atraumatic.     Mouth/Throat:     Mouth: Mucous membranes are moist.  Eyes:     Conjunctiva/sclera: Conjunctivae normal.  Cardiovascular:     Rate and Rhythm: Normal rate and regular rhythm.     Heart sounds: No murmur heard. Pulmonary:     Effort: Pulmonary effort is normal. No respiratory distress.     Breath sounds: Normal breath sounds.  Abdominal:     Palpations: Abdomen is soft.     Tenderness: There is no abdominal tenderness.  Musculoskeletal:         General: No swelling. Normal range of motion.     Cervical back: Neck supple.  Skin:    General: Skin is warm and dry.     Capillary Refill: Capillary refill takes less than 2 seconds.  Neurological:     General: No focal deficit present.     Mental Status: He is alert and oriented to person, place, and time.  Psychiatric:        Mood and Affect: Mood normal.     ED Results / Procedures / Treatments   Labs (all labs ordered are listed, but only abnormal results are displayed) Labs Reviewed  URINALYSIS, ROUTINE W REFLEX MICROSCOPIC - Abnormal; Notable for the following components:      Result Value   Color, Urine AMBER (*)    APPearance CLOUDY (*)    Hgb urine dipstick SMALL (*)    Ketones, ur 5 (*)    Protein, ur 100 (*)    Leukocytes,Ua LARGE (*)    Bacteria, UA RARE (*)    All other components within normal limits  CBC WITH DIFFERENTIAL/PLATELET - Abnormal; Notable for the following components:   RBC 4.18 (*)    Hemoglobin 12.0 (*)    HCT 38.5 (*)    Lymphs Abs 0.6 (*)    All other components within normal limits  BASIC METABOLIC PANEL - Abnormal; Notable for the following components:   Potassium 3.2 (*)    Glucose, Bld 115 (*)    All other components within normal limits  URINE CULTURE    EKG None  Radiology No results found.  Procedures Procedures    Medications Ordered in ED Medications  cefdinir (OMNICEF) capsule 300 mg (300 mg Oral Given 12/17/22 1232)  ondansetron (ZOFRAN-ODT) disintegrating tablet 4 mg (4 mg Oral Given 12/17/22 1019)  potassium chloride SA (KLOR-CON M) CR tablet 40 mEq (40 mEq Oral Given 12/17/22 1234)    ED Course/ Medical Decision Making/ A&P                                 Medical Decision Making This patient presents to the ED for concern of urinary frequency and urgency, this involves an extensive number of treatment options, and is a complaint that carries with it a high risk of complications and morbidity.  The  differential diagnosis includes GI, BPH, prostatitis, bladder outlet obstruction, NPH, other   Co morbidities that complicate the patient evaluation :   Wheelchair-bound due to [place],  meningioma's of brain   Additional history obtained:  Additional history obtained from EMR External records from outside source obtained and reviewed including notes   Lab Tests:  I Ordered, and personally interpreted labs.  The pertinent results include: CBC mild anemia around his baseline, no leukocytosis, BMP shows mild hypokalemia urinalysis shows cloudy urine with large leukocytes, 21-50 red blood cells, greater than 50 white blood cells and rare bacteria.  This was also sent for culture     Problem List / ED Course / Critical interventions / Medication management  UTI-patient having urinary frequency and urgency, having small amount of incontinence as well.  Started last night.  He does have UTI other labs are reassuring.  He has no confusion, no fevers or chills, no other neurologic deficits outside of his baseline.  His urine was sent for culture and I do not feel he needs any further workup at this time, we will put him on cephalosporin, advised on close follow-up and return precautions. Does not have urinary retention, bladder scan showed less than 10 mL of urine in the bladder at bedside  I have reviewed the patients home medicines and have made adjustments as needed   Amount and/or Complexity of Data Reviewed Labs: ordered.  Risk Prescription drug management.           Final Clinical Impression(s) / ED Diagnoses Final diagnoses:  Acute cystitis with hematuria  Hypokalemia    Rx / DC Orders ED Discharge Orders          Ordered    cefpodoxime (VANTIN) 200 MG tablet  2 times daily        12/17/22 [address], PA-C 12/17/22 1237    Loetta Rough, MD 12/17/22 1551

## 2022-12-18 ENCOUNTER — Other Ambulatory Visit: Payer: Self-pay | Admitting: Family Medicine

## 2022-12-18 DIAGNOSIS — I4819 Other persistent atrial fibrillation: Secondary | ICD-10-CM

## 2022-12-19 LAB — URINE CULTURE: Culture: 20000 — AB

## 2022-12-20 ENCOUNTER — Telehealth (HOSPITAL_BASED_OUTPATIENT_CLINIC_OR_DEPARTMENT_OTHER): Payer: Self-pay

## 2022-12-20 NOTE — Telephone Encounter (Signed)
Post ED Visit - Positive Culture Follow-up  Culture report reviewed by antimicrobial stewardship pharmacist: [facility] [x]  Ruben Im, Pharm.D. []  Celedonio Miyamoto, Pharm.D., BCPS AQ-ID []  Garvin Fila, Pharm.D., BCPS []  Georgina Pillion, Pharm.D., BCPS []  [place], [address].D., BCPS, AAHIVP []  Estella Husk, Pharm.D., BCPS, AAHIVP []  Lysle Pearl, PharmD, BCPS []  Phillips Climes, PharmD, BCPS []  Agapito Games, PharmD, BCPS []  Verlan Friends, PharmD []  Mervyn Gay, PharmD, BCPS []  Vinnie Level, PharmD  [facility] []  Len Childs, PharmD []  Greer Pickerel, PharmD []  Adalberto Cole, PharmD []  Perlie Gold, Rph []  Lonell Face) Jean Rosenthal, PharmD []  Earl Many, PharmD []  Junita Push, PharmD []  Dorna Leitz, PharmD []  Terrilee Files, PharmD []  Lynann Beaver, PharmD []  Keturah Barre, PharmD []  Loralee Pacas, PharmD []  Bernadene Person, PharmD   Positive urine culture Treated with Cefpodoxime Proxetil, organism sensitive to the same and no further patient follow-up is required at this time.  Sandria Senter 12/20/2022, 11:06 AM

## 2023-01-16 ENCOUNTER — Encounter (INDEPENDENT_AMBULATORY_CARE_PROVIDER_SITE_OTHER): Payer: Self-pay | Admitting: Gastroenterology

## 2023-01-21 ENCOUNTER — Telehealth: Payer: Self-pay | Admitting: *Deleted

## 2023-01-21 DIAGNOSIS — D485 Neoplasm of uncertain behavior of skin: Secondary | ICD-10-CM | POA: Diagnosis not present

## 2023-01-21 DIAGNOSIS — L570 Actinic keratosis: Secondary | ICD-10-CM | POA: Diagnosis not present

## 2023-01-21 NOTE — Telephone Encounter (Signed)
Transition Care Management Unsuccessful Follow-up Telephone Call  Date of discharge and from where:  [facility]  12/17/2022  Attempts:  1st Attempt  Reason for unsuccessful TCM follow-up call:  Left voice message

## 2023-01-22 ENCOUNTER — Ambulatory Visit: Payer: Medicare Other | Admitting: Family Medicine

## 2023-01-29 ENCOUNTER — Other Ambulatory Visit: Payer: Self-pay | Admitting: Family Medicine

## 2023-01-29 DIAGNOSIS — K080 Exfoliation of teeth due to systemic causes: Secondary | ICD-10-CM | POA: Diagnosis not present

## 2023-01-29 DIAGNOSIS — I10 Essential (primary) hypertension: Secondary | ICD-10-CM

## 2023-01-29 DIAGNOSIS — R601 Generalized edema: Secondary | ICD-10-CM

## 2023-02-16 ENCOUNTER — Other Ambulatory Visit: Payer: Self-pay | Admitting: Family Medicine

## 2023-02-16 DIAGNOSIS — E782 Mixed hyperlipidemia: Secondary | ICD-10-CM

## 2023-02-16 DIAGNOSIS — Z8673 Personal history of transient ischemic attack (TIA), and cerebral infarction without residual deficits: Secondary | ICD-10-CM

## 2023-02-16 DIAGNOSIS — I252 Old myocardial infarction: Secondary | ICD-10-CM

## 2023-02-16 DIAGNOSIS — K219 Gastro-esophageal reflux disease without esophagitis: Secondary | ICD-10-CM

## 2023-02-16 DIAGNOSIS — I714 Abdominal aortic aneurysm, without rupture, unspecified: Secondary | ICD-10-CM

## 2023-02-25 ENCOUNTER — Encounter: Payer: Self-pay | Admitting: Family Medicine

## 2023-02-25 ENCOUNTER — Ambulatory Visit: Payer: Medicare Other | Admitting: Family Medicine

## 2023-02-25 VITALS — BP 126/62 | HR 58 | Temp 97.5°F

## 2023-02-25 DIAGNOSIS — F411 Generalized anxiety disorder: Secondary | ICD-10-CM

## 2023-02-25 DIAGNOSIS — I714 Abdominal aortic aneurysm, without rupture, unspecified: Secondary | ICD-10-CM | POA: Diagnosis not present

## 2023-02-25 DIAGNOSIS — I4819 Other persistent atrial fibrillation: Secondary | ICD-10-CM

## 2023-02-25 DIAGNOSIS — G609 Hereditary and idiopathic neuropathy, unspecified: Secondary | ICD-10-CM

## 2023-02-25 DIAGNOSIS — I10 Essential (primary) hypertension: Secondary | ICD-10-CM

## 2023-02-25 DIAGNOSIS — F332 Major depressive disorder, recurrent severe without psychotic features: Secondary | ICD-10-CM

## 2023-02-25 DIAGNOSIS — R601 Generalized edema: Secondary | ICD-10-CM

## 2023-02-25 DIAGNOSIS — E782 Mixed hyperlipidemia: Secondary | ICD-10-CM

## 2023-02-25 DIAGNOSIS — Z8673 Personal history of transient ischemic attack (TIA), and cerebral infarction without residual deficits: Secondary | ICD-10-CM

## 2023-02-25 DIAGNOSIS — G610 Guillain-Barre syndrome: Secondary | ICD-10-CM

## 2023-02-25 DIAGNOSIS — R351 Nocturia: Secondary | ICD-10-CM | POA: Diagnosis not present

## 2023-02-25 DIAGNOSIS — N401 Enlarged prostate with lower urinary tract symptoms: Secondary | ICD-10-CM | POA: Diagnosis not present

## 2023-02-25 DIAGNOSIS — K219 Gastro-esophageal reflux disease without esophagitis: Secondary | ICD-10-CM

## 2023-02-25 MED ORDER — TAMSULOSIN HCL 0.4 MG PO CAPS: 0.8000 mg | ORAL_CAPSULE | Freq: Every day | ORAL | 3 refills | Status: DC

## 2023-02-25 MED ORDER — LISINOPRIL 5 MG PO TABS
5.0000 mg | ORAL_TABLET | Freq: Every day | ORAL | 3 refills | Status: DC
Start: 2023-02-25 — End: 2024-02-17

## 2023-02-25 MED ORDER — ESCITALOPRAM OXALATE 20 MG PO TABS
20.0000 mg | ORAL_TABLET | Freq: Every day | ORAL | 3 refills | Status: DC
Start: 2023-02-25 — End: 2023-12-11

## 2023-02-25 MED ORDER — FUROSEMIDE 20 MG PO TABS: 20.0000 mg | ORAL_TABLET | Freq: Every day | ORAL | 3 refills | Status: DC

## 2023-02-25 MED ORDER — AMLODIPINE BESYLATE 5 MG PO TABS
5.0000 mg | ORAL_TABLET | Freq: Every day | ORAL | 3 refills | Status: DC
Start: 2023-02-25 — End: 2023-04-29

## 2023-02-25 MED ORDER — GABAPENTIN 300 MG PO CAPS: ORAL_CAPSULE | ORAL | 3 refills | Status: DC

## 2023-02-25 MED ORDER — MIRTAZAPINE 7.5 MG PO TABS: 7.5000 mg | ORAL_TABLET | Freq: Every day | ORAL | 3 refills | Status: DC

## 2023-02-25 MED ORDER — PRAVASTATIN SODIUM 40 MG PO TABS: 40.0000 mg | ORAL_TABLET | Freq: Every day | ORAL | 3 refills | Status: DC

## 2023-02-25 MED ORDER — SUCRALFATE 1 G PO TABS: 1.0000 g | ORAL_TABLET | Freq: Two times a day (BID) | ORAL | 3 refills | Status: DC

## 2023-02-25 MED ORDER — PANTOPRAZOLE SODIUM 40 MG PO TBEC: 40.0000 mg | DELAYED_RELEASE_TABLET | Freq: Two times a day (BID) | ORAL | 3 refills | Status: DC

## 2023-02-25 MED ORDER — APIXABAN 5 MG PO TABS: 5.0000 mg | ORAL_TABLET | Freq: Two times a day (BID) | ORAL | 3 refills | Status: DC

## 2023-02-25 NOTE — Progress Notes (Signed)
Subjective:  Patient ID: Theodore Mendoza, male    DOB: 27-Feb-1941  Age: 82 y.o. MRN: [id number]  CC: Medical Management of Chronic Issues   HPI Theodore Mendoza presents for  presents for  follow-up of hypertension. Patient has no history of headache chest pain or shortness of breath or recent cough. Patient also denies symptoms of TIA such as focal numbness or weakness. Patient denies side effects from medication. States taking it regularly.   Atrial fibrillation follow up. Pt. is treated with rate control and anticoagulation. Pt.  denies palpitations, rapid rate, chest pain, dyspnea and edema. There has been no bleeding from nose or gums. Pt. has not noticed blood with urine or stool.  Although there is routine bruising easily, it is not excessive.    in for follow-up of elevated cholesterol. Doing well without complaints on current medication. Denies side effects of statin including myalgia and arthralgia and nausea. Currently no chest pain, shortness of breath or other cardiovascular related symptoms noted.      02/25/2023   11:13 AM 10/16/2022    9:55 AM 08/15/2022    8:42 AM  Depression screen PHQ 2/9  Decreased Interest 0 3 1  Down, Depressed, Hopeless 0 2 1  PHQ - 2 Score 0 5 2  Altered sleeping  3 3  Tired, decreased energy  3 3  Change in appetite  2 0  Feeling bad or failure about yourself   3 1  Trouble concentrating  1 0  Moving slowly or fidgety/restless  3 0  Suicidal thoughts  1 0  PHQ-9 Score  21 9  Difficult doing work/chores  Very difficult Not difficult at all    History Theodore Mendoza has a past medical history of AAA (abdominal aortic aneurysm) (HCC), Atrial fibrillation (HCC), Brain tumor (HCC), Cancer (HCC), Cardiac arrhythmia, CHF (congestive heart failure) (HCC), GERD (gastroesophageal reflux disease), Guillain-Barre syndrome (HCC) (02/13/1986), Headache, Hypertension, Kidney stones, and Myocardial infarction (HCC) (2007).   He has a past surgical history that includes  Cystoscopy with stent placement (Left, 05/08/2014); Coronary angioplasty (1997); Eye surgery (Right, may 2015); gamma kniferadiation treatment (Feb 09 2014); Lithotripsy (years ago); Cystoscopy with retrograde pyelogram, ureteroscopy and stent placement (Left, 06/28/2014); Holmium laser application (Left, 06/28/2014); Stone extraction with basket (Left, 06/28/2014); Esophagogastroduodenoscopy (egd) with propofol (N/A, 12/16/2019); Hemostasis clip placement (12/16/2019); Hemostasis control (12/16/2019); polypectomy (12/16/2019); Esophagogastroduodenoscopy (egd) with propofol (N/A, 08/13/2020); Colonoscopy with propofol (N/A, 08/14/2020); Esophagogastroduodenoscopy (egd) with propofol (N/A, 09/16/2020); enteroscopy (N/A, 09/16/2020); Givens capsule study (N/A, 10/11/2020); Esophagogastroduodenoscopy (egd) with propofol (N/A, 10/25/2020); enteroscopy (N/A, 10/25/2020); and Abdominal aortic endovascular stent graft (Bilateral, 04/04/2022).   His family history includes CAD in his father; Diabetes in his brother, mother, and sister; Lung cancer in his mother; Other in his maternal grandfather; Stroke in his sister.He reports that he has never smoked. He has never been exposed to tobacco smoke. He has never used smokeless tobacco. He reports that he does not drink alcohol and does not use drugs.    ROS Review of Systems  Constitutional:  Negative for fever.  Respiratory:  Negative for shortness of breath.   Cardiovascular:  Negative for chest pain.  Musculoskeletal:  Negative for arthralgias.  Skin:  Negative for rash.    Objective:  BP 126/62   Pulse (!) 58   Temp (!) 97.5 F (36.4 C)   SpO2 98%   BP Readings from Last 3 Encounters:  02/25/23 126/62  12/17/22 (!) 140/71  11/19/22 130/67    Wt  Readings from Last 3 Encounters:  12/17/22 150 lb (68 kg)  10/16/22 150 lb (68 kg)  08/15/22 150 lb (68 kg)     Physical Exam Vitals reviewed.  Constitutional:      Appearance: He is well-developed.  HENT:      Head: Normocephalic and atraumatic.     Right Ear: External ear normal.     Left Ear: External ear normal.     Mouth/Throat:     Pharynx: No oropharyngeal exudate or posterior oropharyngeal erythema.  Eyes:     Pupils: Pupils are equal, round, and reactive to light.  Cardiovascular:     Rate and Rhythm: Normal rate and regular rhythm.     Heart sounds: No murmur heard. Pulmonary:     Effort: No respiratory distress.     Breath sounds: Normal breath sounds.  Musculoskeletal:        General: Deformity (paraplegic, wheelchair bound) present.     Cervical back: Normal range of motion and neck supple.  Neurological:     Mental Status: He is alert and oriented to person, place, and time.       Assessment & Plan:   Theodore Mendoza was seen today for medical management of chronic issues.  Diagnoses and all orders for this visit:  Benign prostatic hyperplasia with nocturia -     CMP14+EGFR -     PSA, total and free  Essential (primary) hypertension -     amLODipine (NORVASC) 5 MG tablet; Take 1 tablet (5 mg total) by mouth daily. -     lisinopril (ZESTRIL) 5 MG tablet; Take 1 tablet (5 mg total) by mouth daily. -     CMP14+EGFR  Persistent atrial fibrillation (HCC) -     apixaban (ELIQUIS) 5 MG TABS tablet; Take 1 tablet (5 mg total) by mouth 2 (two) times daily. -     CMP14+EGFR  Severe episode of recurrent major depressive disorder, without psychotic features (HCC) -     escitalopram (LEXAPRO) 20 MG tablet; Take 1 tablet (20 mg total) by mouth daily. -     mirtazapine (REMERON) 7.5 MG tablet; Take 1 tablet (7.5 mg total) by mouth at bedtime. -     CMP14+EGFR  Generalized anxiety disorder -     escitalopram (LEXAPRO) 20 MG tablet; Take 1 tablet (20 mg total) by mouth daily. -     mirtazapine (REMERON) 7.5 MG tablet; Take 1 tablet (7.5 mg total) by mouth at bedtime. -     CMP14+EGFR  Generalized edema -     furosemide (LASIX) 20 MG tablet; Take 1 tablet (20 mg total) by mouth  daily. -     CMP14+EGFR  Guillain Barr syndrome (HCC) -     gabapentin (NEURONTIN) 300 MG capsule; TAKE 1 CAPSULE BY MOUTH 3 TIMES A DAY -     CMP14+EGFR  Idiopathic peripheral neuropathy -     gabapentin (NEURONTIN) 300 MG capsule; TAKE 1 CAPSULE BY MOUTH 3 TIMES A DAY -     CMP14+EGFR  Gastroesophageal reflux disease, unspecified whether esophagitis present -     pantoprazole (PROTONIX) 40 MG tablet; Take 1 tablet (40 mg total) by mouth 2 (two) times daily. -     sucralfate (CARAFATE) 1 g tablet; Take 1 tablet (1 g total) by mouth 2 (two) times daily. -     CMP14+EGFR  Mixed hyperlipidemia -     pravastatin (PRAVACHOL) 40 MG tablet; Take 1 tablet (40 mg total) by mouth daily. -  CMP14+EGFR -     Lipid panel  Abdominal aortic aneurysm (AAA) without rupture, unspecified part (HCC) -     pravastatin (PRAVACHOL) 40 MG tablet; Take 1 tablet (40 mg total) by mouth daily. -     CMP14+EGFR  History of TIA (transient ischemic attack) -     pravastatin (PRAVACHOL) 40 MG tablet; Take 1 tablet (40 mg total) by mouth daily. -     CMP14+EGFR  Other orders -     tamsulosin (FLOMAX) 0.4 MG CAPS capsule; Take 2 capsules (0.8 mg total) by mouth at bedtime. For urine flow and prostate       I have discontinued Tylee L. Orth's alfuzosin. I have changed his Eliquis to apixaban. I have also changed his amLODipine, furosemide, pantoprazole, and pravastatin. Additionally, I am having him start on tamsulosin. Lastly, I am having him maintain his cyanocobalamin, albuterol, FeroSul, dofetilide, finasteride, escitalopram, gabapentin, lisinopril, mirtazapine, and sucralfate.  Allergies as of 02/25/2023       Reactions   Valsartan Rash   Morphine And Codeine Other (See Comments)   Reaction:  Confusion/weakness/hallucinations        Medication List        Accurate as of February 25, 2023 10:07 PM. If you have any questions, ask your nurse or doctor.          STOP taking these  medications    alfuzosin 10 MG 24 hr tablet Commonly known as: UROXATRAL Stopped by: Khalaya Mcgurn       TAKE these medications    albuterol 108 (90 Base) MCG/ACT inhaler Commonly known as: VENTOLIN HFA INHALE 2 PUFFS INTO THE LUNGS EVERY 6 HOURS AS NEEDED FOR WHEEZING OR SHORTNESS OF BREATH   amLODipine 5 MG tablet Commonly known as: NORVASC Take 1 tablet (5 mg total) by mouth daily.   apixaban 5 MG Tabs tablet Commonly known as: Eliquis Take 1 tablet (5 mg total) by mouth 2 (two) times daily.   cyanocobalamin 1000 MCG tablet Commonly known as: VITAMIN B12 Take 1 tablet (1,000 mcg total) by mouth daily.   dofetilide 500 MCG capsule Commonly known as: TIKOSYN Take 1 capsule (500 mcg total) by mouth 2 (two) times daily.   escitalopram 20 MG tablet Commonly known as: LEXAPRO Take 1 tablet (20 mg total) by mouth daily.   FeroSul 325 (65 Fe) MG tablet Generic drug: ferrous sulfate TAKE 1 TABLET DAILY   finasteride 5 MG tablet Commonly known as: Proscar Take 1 tablet (5 mg total) by mouth daily. For urine flow   furosemide 20 MG tablet Commonly known as: LASIX Take 1 tablet (20 mg total) by mouth daily.   gabapentin 300 MG capsule Commonly known as: NEURONTIN TAKE 1 CAPSULE BY MOUTH 3 TIMES A DAY   lisinopril 5 MG tablet Commonly known as: ZESTRIL Take 1 tablet (5 mg total) by mouth daily.   mirtazapine 7.5 MG tablet Commonly known as: REMERON Take 1 tablet (7.5 mg total) by mouth at bedtime.   pantoprazole 40 MG tablet Commonly known as: PROTONIX Take 1 tablet (40 mg total) by mouth 2 (two) times daily.   pravastatin 40 MG tablet Commonly known as: PRAVACHOL Take 1 tablet (40 mg total) by mouth daily.   sucralfate 1 g tablet Commonly known as: CARAFATE Take 1 tablet (1 g total) by mouth 2 (two) times daily.   tamsulosin 0.4 MG Caps capsule Commonly known as: FLOMAX Take 2 capsules (0.8 mg total) by mouth at bedtime. For urine flow and  prostate Started by: Broadus Quintell Quinley Nesler         Follow-up: Return in about 3 months (around 05/28/2023).  Mechele Claude, M.D.

## 2023-02-26 LAB — LIPID PANEL
Chol/HDL Ratio: 2.8 {ratio} (ref 0.0–5.0)
Cholesterol, Total: 121 mg/dL (ref 100–199)
HDL: 44 mg/dL (ref >39)
LDL Chol Calc (NIH): 62 mg/dL (ref 0–99)
Triglycerides: 71 mg/dL (ref 0–149)
VLDL Cholesterol Cal: 15 mg/dL (ref 5–40)

## 2023-02-26 LAB — PSA, TOTAL AND FREE
PSA, Free Pct: 20.3 %
PSA, Free: 0.77 ng/mL
Prostate Specific Ag, Serum: 3.8 ng/mL (ref 0.0–4.0)

## 2023-02-26 LAB — CMP14+EGFR
ALT: 7 [IU]/L (ref 0–44)
AST: 14 [IU]/L (ref 0–40)
Albumin: 4 g/dL (ref 3.7–4.7)
Alkaline Phosphatase: 67 [IU]/L (ref 44–121)
BUN/Creatinine Ratio: 23 (ref 10–24)
BUN: 18 mg/dL (ref 8–27)
Bilirubin Total: 0.5 mg/dL (ref 0.0–1.2)
CO2: 30 mmol/L — ABNORMAL HIGH (ref 20–29)
Calcium: 9.1 mg/dL (ref 8.6–10.2)
Chloride: 105 mmol/L (ref 96–106)
Creatinine, Ser: 0.8 mg/dL (ref 0.76–1.27)
Globulin, Total: 1.7 g/dL (ref 1.5–4.5)
Glucose: 97 mg/dL (ref 70–99)
Potassium: 3.9 mmol/L (ref 3.5–5.2)
Sodium: 147 mmol/L — ABNORMAL HIGH (ref 134–144)
Total Protein: 5.7 g/dL — ABNORMAL LOW (ref 6.0–8.5)
eGFR: 87 mL/min/{1.73_m2} (ref >59)

## 2023-02-26 NOTE — Progress Notes (Signed)
Hello Theodore Mendoza,  Your lab result is normal and/or stable.Some minor variations that are not significant are commonly marked abnormal, but do not represent any medical problem for you.  Best regards, Mechele Claude, M.D.

## 2023-03-04 ENCOUNTER — Ambulatory Visit (INDEPENDENT_AMBULATORY_CARE_PROVIDER_SITE_OTHER): Payer: Medicare Other | Admitting: Gastroenterology

## 2023-03-04 ENCOUNTER — Encounter (INDEPENDENT_AMBULATORY_CARE_PROVIDER_SITE_OTHER): Payer: Self-pay | Admitting: Gastroenterology

## 2023-03-04 VITALS — BP 144/65 | HR 61 | Temp 98.2°F | Ht 68.0 in | Wt 150.0 lb

## 2023-03-04 DIAGNOSIS — D509 Iron deficiency anemia, unspecified: Secondary | ICD-10-CM

## 2023-03-04 DIAGNOSIS — Z8719 Personal history of other diseases of the digestive system: Secondary | ICD-10-CM

## 2023-03-04 NOTE — Progress Notes (Signed)
Theodore Mendoza, M.D. Gastroenterology & Hepatology [facility] Gastroenterology [address] [place], [place] [address]  Primary Care Physician: Mechele Claude, MD [address] [place] [address]  I will communicate my assessment and recommendations to the referring MD via EMR.  Problems: Iron deficiency anemia  History of Present Illness: Theodore Mendoza is a 82 y.o. male with past medical history of brain tumor, afib on Eliquis, Guillian Barr and heart failure, who comes to clinic for follow up of iron deficiency anemia.  The patient was last seen on 03/08/2022. At that time, the patient was continued on PPI twice daily and oral iron.  His iron saturation was 17%, ferritin 28, iron 53 and TIBC 312, hemoglobin was 11.8.  Patient is currently taking oral iron twice a day.  Patient states feeling well and is asymptomatic.  He has been sad as his brother passed away on the day of Thanksgiving.  The patient denies having any nausea, vomiting, fever, chills, hematochezia, melena, hematemesis, abdominal distention, abdominal pain, diarrhea, jaundice, pruritus or weight loss.  Most recent labs from 10/16/2022 showed a hemoglobin of 11.3 with MCV of 92.  Last EGD: 08/13/2020 - Normal hypopharynx. - Normal esophagus. - Z- line irregular, 41 cm from the incisors. - Two gastric polyps. - Two clips on place at previous polypectomy site without stigmata of bleed. - Normal duodenal bulb, second portion of the duodenum and third portion of the duodenum.  Capsule endoscopy 10/11/2020 - 1 nonbleeding AVM in the duodenum  First push enteroscopy 09/16/2020 Endo Clip found in the gastric body, normal duodenum and jejunum  Second push enteroscopy 10/25/2020, normal esophagus, stomach, duodenum and jejunum  Last Colonoscopy: 08/14/2020 Diverticulosis, internal and external hemorrhoids  Past Medical History: Past Medical History:  Diagnosis Date   AAA (abdominal aortic  aneurysm) (HCC)    Atrial fibrillation (HCC)    Brain tumor (HCC)    x2 (benign per pt)   Cancer (HCC)    melanoma removed twice (head and right forearm)   Cardiac arrhythmia    CHF (congestive heart failure) (HCC)    GERD (gastroesophageal reflux disease)    Guillain-Barre syndrome (HCC) 02/13/1986   Headache    Hypertension    Kidney stones    Myocardial infarction [facility]) 2007    Past Surgical History: Past Surgical History:  Procedure Laterality Date   ABDOMINAL AORTIC ENDOVASCULAR STENT GRAFT Bilateral 04/04/2022   Procedure: ABDOMINAL AORTIC ENDOVASCULAR STENT GRAFT;  Surgeon: Nada Libman, MD;  Location: [facility] OR;  Service: Vascular;  Laterality: Bilateral;   COLONOSCOPY WITH PROPOFOL N/A 08/14/2020   Procedure: COLONOSCOPY WITH PROPOFOL;  Surgeon: Malissa Hippo, MD;  Location: [facility];  Service: Endoscopy;  Laterality: N/A;   CORONARY ANGIOPLASTY  1997   after mi   CYSTOSCOPY WITH RETROGRADE PYELOGRAM, URETEROSCOPY AND STENT PLACEMENT Left 06/28/2014   Procedure: 1ST STAGE CYSTOSCOPY/URETEROSCOPY/STENT PLACEMENT;  Surgeon: Sebastian Ache, MD;  Location: [facility];  Service: Urology;  Laterality: Left;   CYSTOSCOPY WITH STENT PLACEMENT Left 05/08/2014   Procedure: CYSTOSCOPY, RETROGRADE PYELOGRAM WITH LEFT URETERAL STENT PLACEMENT;  Surgeon: Sebastian Ache, MD;  Location: [facility];  Service: Urology;  Laterality: Left;   ENTEROSCOPY N/A 09/16/2020   Procedure: PUSH ENTEROSCOPY;  Surgeon: Dolores Frame, MD;  [facility]: [facility];  Service: Gastroenterology;  Laterality: N/A;   ENTEROSCOPY N/A 10/25/2020   Procedure: PUSH ENTEROSCOPY;  Surgeon: Dolores Frame, MD;  [facility];  Service: Gastroenterology;  Laterality: N/A;   ESOPHAGOGASTRODUODENOSCOPY (EGD) WITH PROPOFOL N/A 12/16/2019   Procedure: ESOPHAGOGASTRODUODENOSCOPY (EGD) WITH PROPOFOL;  Surgeon: Napoleon Form, MD;  [facility]: [facility];  Service: Endoscopy;  Laterality: N/A;    ESOPHAGOGASTRODUODENOSCOPY (EGD) WITH PROPOFOL N/A 08/13/2020   Procedure: ESOPHAGOGASTRODUODENOSCOPY (EGD) WITH PROPOFOL;  Surgeon: Malissa Hippo, MD;  [facility];  Service: Endoscopy;  Laterality: N/A;   ESOPHAGOGASTRODUODENOSCOPY (EGD) WITH PROPOFOL N/A 09/16/2020   Procedure: ESOPHAGOGASTRODUODENOSCOPY (EGD) WITH PROPOFOL;  Surgeon: Dolores Frame, MD;  [facility];  Service: Gastroenterology;  Laterality: N/A;  1:55   ESOPHAGOGASTRODUODENOSCOPY (EGD) WITH PROPOFOL N/A 10/25/2020   Procedure: ESOPHAGOGASTRODUODENOSCOPY (EGD) WITH PROPOFOL;  Surgeon: Dolores Frame, MD;  [facility];  Service: Gastroenterology;  Laterality: N/A;  2:00   EYE SURGERY Right may 2015   growth removed, july 2015left eye cataract removed, right eye catarct removed   gamma kniferadiation treatment  Feb 09 2014   [facility]   GIVENS CAPSULE STUDY N/A 10/11/2020   Procedure: GIVENS CAPSULE STUDY;  Surgeon: Dolores Frame, MD;  [facility];  Service: Gastroenterology;  Laterality: N/A;  7:30   HEMOSTASIS CLIP PLACEMENT  12/16/2019   Procedure: HEMOSTASIS CLIP PLACEMENT;  Surgeon: Napoleon Form, MD;  Location: [facility];  Service: Endoscopy;;   HEMOSTASIS CONTROL  12/16/2019   Procedure: HEMOSTASIS CONTROL;  Surgeon: Napoleon Form, MD;  Location: [facility];  Service: Endoscopy;;  Endoloop   HOLMIUM LASER APPLICATION Left 06/28/2014   Procedure: HOLMIUM LASER APPLICATION;  Surgeon: Sebastian Ache, MD;  Location: [facility];  Service: Urology;  Laterality: Left;   LITHOTRIPSY  years ago   POLYPECTOMY  12/16/2019   Procedure: POLYPECTOMY;  Surgeon: Napoleon Form, MD;  [facility]: [facility];  Service: Endoscopy;;   STONE EXTRACTION WITH BASKET Left 06/28/2014   Procedure: STONE EXTRACTION WITH BASKET;  Surgeon: Sebastian Ache, MD;  Location: [facility];  Service: Urology;  Laterality: Left;    Family  History: Family History  Problem Relation Age of Onset   Diabetes Mother    Lung cancer Mother    CAD Father    Diabetes Brother    Diabetes Sister    Stroke Sister    Other Maternal Grandfather        brain tumor   Colon cancer Neg Hx    Pancreatic cancer Neg Hx    Esophageal cancer Neg Hx     Social History: Social History   Tobacco Use  Smoking Status Never   Passive exposure: Never  Smokeless Tobacco Never   Social History   Substance and Sexual Activity  Alcohol Use No   Social History   Substance and Sexual Activity  Drug Use No    Allergies: Allergies  Allergen Reactions   Valsartan Rash   Morphine And Codeine Other (See Comments)    Reaction:  Confusion/weakness/hallucinations    Medications: Current Outpatient Medications  Medication Sig Dispense Refill   amLODipine (NORVASC) 5 MG tablet Take 1 tablet (5 mg total) by mouth daily. 90 tablet 3   apixaban (ELIQUIS) 5 MG TABS tablet Take 1 tablet (5 mg total) by mouth 2 (two) times daily. 180 tablet 3   dofetilide (TIKOSYN) 500 MCG capsule Take 1 capsule (500 mcg total) by mouth 2 (two) times daily. 180 capsule 3   escitalopram (LEXAPRO) 20 MG tablet Take 1 tablet (20 mg total) by mouth daily. 90 tablet 3   FEROSUL 325 (65 Fe) MG tablet  TAKE 1 TABLET DAILY 90 tablet 0   finasteride (PROSCAR) 5 MG tablet Take 1 tablet (5 mg total) by mouth daily. For urine flow 90 tablet 3   furosemide (LASIX) 20 MG tablet Take 1 tablet (20 mg total) by mouth daily. 90 tablet 3   gabapentin (NEURONTIN) 300 MG capsule TAKE 1 CAPSULE BY MOUTH 3 TIMES A DAY 360 capsule 3   lisinopril (ZESTRIL) 5 MG tablet Take 1 tablet (5 mg total) by mouth daily. 90 tablet 3   mirtazapine (REMERON) 7.5 MG tablet Take 1 tablet (7.5 mg total) by mouth at bedtime. 90 tablet 3   pantoprazole (PROTONIX) 40 MG tablet Take 1 tablet (40 mg total) by mouth 2 (two) times daily. 180 tablet 3   pravastatin (PRAVACHOL) 40 MG tablet Take 1 tablet (40 mg  total) by mouth daily. 90 tablet 3   sucralfate (CARAFATE) 1 g tablet Take 1 tablet (1 g total) by mouth 2 (two) times daily. 180 tablet 3   tamsulosin (FLOMAX) 0.4 MG CAPS capsule Take 2 capsules (0.8 mg total) by mouth at bedtime. For urine flow and prostate 180 capsule 3   vitamin B-12 (CYANOCOBALAMIN) 1000 MCG tablet Take 1 tablet (1,000 mcg total) by mouth daily. 30 tablet 2   No current facility-administered medications for this visit.    Review of Systems: GENERAL: negative for malaise, night sweats HEENT: No changes in hearing or vision, no nose bleeds or other nasal problems. NECK: Negative for lumps, goiter, pain and significant neck swelling RESPIRATORY: Negative for cough, wheezing CARDIOVASCULAR: Negative for chest pain, leg swelling, palpitations, orthopnea GI: SEE HPI MUSCULOSKELETAL: Negative for joint pain or swelling, back pain, and muscle pain. SKIN: Negative for lesions, rash PSYCH: Negative for sleep disturbance, mood disorder and recent psychosocial stressors. HEMATOLOGY Negative for prolonged bleeding, bruising easily, and swollen nodes. ENDOCRINE: Negative for cold or heat intolerance, polyuria, polydipsia and goiter. NEURO: negative for tremor, gait imbalance, syncope and seizures. The remainder of the review of systems is noncontributory.   Physical Exam: BP (!) 144/65   Pulse 61   Temp 98.2 F (36.8 C) (Oral)   Ht 5\' 8"  (1.727 m)   Wt 150 lb (68 kg) Comment: per patient. unable to stand  BMI 22.81 kg/m  GENERAL: The patient is AO x3, in no acute distress. Sitting in wheelchair. HEENT: Head is normocephalic and atraumatic. EOMI are intact. Mouth is well hydrated and without lesions. NECK: Supple. No masses LUNGS: Clear to auscultation. No presence of rhonchi/wheezing/rales. Adequate chest expansion HEART: RRR, normal s1 and s2. ABDOMEN: Soft, nontender, no guarding, no peritoneal signs, and nondistended. BS +. No masses. EXTREMITIES: Without any  cyanosis, clubbing, rash, lesions or edema. NEUROLOGIC: AOx3, has right sided paresis. SKIN: no jaundice, no rashes  Imaging/Labs: as above  I personally reviewed and interpreted the available labs, imaging and endoscopic files.  Impression and Plan: Theodore Mendoza is a 82 y.o. male with past medical history of brain tumor, afib on Eliquis, Guillian Barr and heart failure, who comes to clinic for follow up of iron deficiency anemia.  Patient has been doing well and denies having any complaints.  Hemoglobin has remained stable in the mid 11s without needing repeat transfusion.  He has had multiple endoscopic evaluations without a defined source of his anemia, although capsule endoscopy showed a diminutive AVM that was not bleeding.  For now, we will continue with oral iron supplementation and we will check CBC and iron stores today.  -Continue oral  iron twice a day -Check CBC and iron stores  All questions were answered.      Theodore Blazing, MD Gastroenterology and Hepatology [facility] Gastroenterology

## 2023-03-04 NOTE — Patient Instructions (Signed)
Continue oral iron twice a day Perform blood workup

## 2023-03-05 LAB — CBC WITH DIFFERENTIAL/PLATELET
Absolute Lymphocytes: 875 {cells}/uL (ref 850–3900)
Absolute Monocytes: 254 {cells}/uL (ref 200–950)
Basophils Absolute: 49 {cells}/uL (ref 0–200)
Basophils Relative: 0.9 %
Eosinophils Absolute: 200 {cells}/uL (ref 15–500)
Eosinophils Relative: 3.7 %
HCT: 34 % — ABNORMAL LOW (ref 38.5–50.0)
Hemoglobin: 10.8 g/dL — ABNORMAL LOW (ref 13.2–17.1)
MCH: 29.3 pg (ref 27.0–33.0)
MCHC: 31.8 g/dL — ABNORMAL LOW (ref 32.0–36.0)
MCV: 92.1 fL (ref 80.0–100.0)
MPV: 11.9 fL (ref 7.5–12.5)
Monocytes Relative: 4.7 %
Neutro Abs: 4023 {cells}/uL (ref 1500–7800)
Neutrophils Relative %: 74.5 %
Platelets: 139 10*3/uL — ABNORMAL LOW (ref 140–400)
RBC: 3.69 10*6/uL — ABNORMAL LOW (ref 4.20–5.80)
RDW: 12.9 % (ref 11.0–15.0)
Total Lymphocyte: 16.2 %
WBC: 5.4 10*3/uL (ref 3.8–10.8)

## 2023-03-05 LAB — IRON,TIBC AND FERRITIN PANEL
%SAT: 18 % — ABNORMAL LOW (ref 20–48)
Ferritin: 29 ng/mL (ref 24–380)
Iron: 55 ug/dL (ref 50–180)
TIBC: 312 ug/dL (ref 250–425)

## 2023-03-11 ENCOUNTER — Ambulatory Visit (INDEPENDENT_AMBULATORY_CARE_PROVIDER_SITE_OTHER): Payer: Medicare Other | Admitting: Gastroenterology

## 2023-03-19 ENCOUNTER — Ambulatory Visit (INDEPENDENT_AMBULATORY_CARE_PROVIDER_SITE_OTHER): Payer: Medicare Other | Admitting: Family Medicine

## 2023-03-19 ENCOUNTER — Encounter: Payer: Self-pay | Admitting: Family Medicine

## 2023-03-19 VITALS — BP 154/64 | HR 74 | Temp 97.9°F

## 2023-03-19 DIAGNOSIS — R42 Dizziness and giddiness: Secondary | ICD-10-CM

## 2023-03-19 DIAGNOSIS — R112 Nausea with vomiting, unspecified: Secondary | ICD-10-CM

## 2023-03-19 DIAGNOSIS — D509 Iron deficiency anemia, unspecified: Secondary | ICD-10-CM

## 2023-03-19 NOTE — Progress Notes (Signed)
Subjective:  Patient ID: Theodore Mendoza, male    DOB: January 04, 1941, 82 y.o.   MRN: [id number]  Patient Care Team: Mechele Claude, MD as PCP - General (Family Medicine) Raynelle Highland as Consulting Physician (Neurosurgery) Berneice Heinrich Delbert Phenix., MD as Consulting Physician (Urology) Danella Maiers, [facility] (Pharmacist) Delora Fuel, OD (Optometry)   Chief Complaint:  Nausea and Dizziness (X 2-3 months only in the mornings after eating medications)   HPI: Theodore Mendoza is a 82 y.o. male presenting on 03/19/2023 for Nausea and Dizziness (X 2-3 months only in the mornings after eating medications)   Discussed the use of AI scribe software for clinical note transcription with the patient, who gave verbal consent to proceed.  History of Present Illness   The patient has been experiencing episodes of nausea, vomiting, and dizziness, particularly after taking her morning medications. The most recent episode occurred yesterday morning, shortly after taking her medications and attempting to eat breakfast. The patient described feeling extremely nauseated, followed by vomiting and significant dizziness, which led her to return to bed until the symptoms subsided. The patient reported a similar, albeit less severe, episode this morning. He expressed concern that his symptoms might be related to his medication regimen or the timing of his medication intake.  He denied any other symptoms during these episodes, such as fever, chills, or blood in stool or vomit. He reported having regular bowel movements and denied any recent changes in stool.          Relevant past medical, surgical, family, and social history reviewed and updated as indicated.  Allergies and medications reviewed and updated. Data reviewed: Chart in Epic.   Past Medical History:  Diagnosis Date   AAA (abdominal aortic aneurysm) (HCC)    Atrial fibrillation (HCC)    Brain tumor (HCC)    x2 (benign per pt)   Cancer (HCC)     melanoma removed twice (head and right forearm)   Cardiac arrhythmia    CHF (congestive heart failure) (HCC)    GERD (gastroesophageal reflux disease)    Guillain-Barre syndrome (HCC) 02/13/1986   Headache    Hypertension    Kidney stones    Myocardial infarction [facility]) 2007    Past Surgical History:  Procedure Laterality Date   ABDOMINAL AORTIC ENDOVASCULAR STENT GRAFT Bilateral 04/04/2022   Procedure: ABDOMINAL AORTIC ENDOVASCULAR STENT GRAFT;  Surgeon: Nada Libman, MD;  Location: [facility] OR;  Service: Vascular;  Laterality: Bilateral;   COLONOSCOPY WITH PROPOFOL N/A 08/14/2020   Procedure: COLONOSCOPY WITH PROPOFOL;  Surgeon: Malissa Hippo, MD;  Location: [facility];  Service: Endoscopy;  Laterality: N/A;   CORONARY ANGIOPLASTY  1997   after mi   CYSTOSCOPY WITH RETROGRADE PYELOGRAM, URETEROSCOPY AND STENT PLACEMENT Left 06/28/2014   Procedure: 1ST STAGE CYSTOSCOPY/URETEROSCOPY/STENT PLACEMENT;  Surgeon: Sebastian Ache, MD;  Location: [facility] ORS;  Service: Urology;  Laterality: Left;   CYSTOSCOPY WITH STENT PLACEMENT Left 05/08/2014   Procedure: CYSTOSCOPY, RETROGRADE PYELOGRAM WITH LEFT URETERAL STENT PLACEMENT;  Surgeon: Sebastian Ache, MD;  Location: [facility];  Service: Urology;  Laterality: Left;   ENTEROSCOPY N/A 09/16/2020   Procedure: PUSH ENTEROSCOPY;  Surgeon: Dolores Frame, MD;  [facility]: [facility];  Service: Gastroenterology;  Laterality: N/A;   ENTEROSCOPY N/A 10/25/2020   Procedure: PUSH ENTEROSCOPY;  Surgeon: Dolores Frame, MD;  [facility];  Service: Gastroenterology;  Laterality: N/A;   ESOPHAGOGASTRODUODENOSCOPY (EGD) WITH PROPOFOL N/A 12/16/2019   Procedure: ESOPHAGOGASTRODUODENOSCOPY (  EGD) WITH PROPOFOL;  Surgeon: Napoleon Form, MD;  [facility];  Service: Endoscopy;  Laterality: N/A;   ESOPHAGOGASTRODUODENOSCOPY (EGD) WITH PROPOFOL N/A 08/13/2020   Procedure: ESOPHAGOGASTRODUODENOSCOPY (EGD) WITH PROPOFOL;  Surgeon:  Malissa Hippo, MD;  [facility];  Service: Endoscopy;  Laterality: N/A;   ESOPHAGOGASTRODUODENOSCOPY (EGD) WITH PROPOFOL N/A 09/16/2020   Procedure: ESOPHAGOGASTRODUODENOSCOPY (EGD) WITH PROPOFOL;  Surgeon: Dolores Frame, MD;  [facility];  Service: Gastroenterology;  Laterality: N/A;  1:55   ESOPHAGOGASTRODUODENOSCOPY (EGD) WITH PROPOFOL N/A 10/25/2020   Procedure: ESOPHAGOGASTRODUODENOSCOPY (EGD) WITH PROPOFOL;  Surgeon: Dolores Frame, MD;  [facility];  Service: Gastroenterology;  Laterality: N/A;  2:00   EYE SURGERY Right may 2015   growth removed, july 2015left eye cataract removed, right eye catarct removed   gamma kniferadiation treatment  Feb 09 2014   [facility]   GIVENS CAPSULE STUDY N/A 10/11/2020   Procedure: GIVENS CAPSULE STUDY;  Surgeon: Dolores Frame, MD;  [facility];  Service: Gastroenterology;  Laterality: N/A;  7:30   HEMOSTASIS CLIP PLACEMENT  12/16/2019   Procedure: HEMOSTASIS CLIP PLACEMENT;  Surgeon: Napoleon Form, MD;  [facility];  Service: Endoscopy;;   HEMOSTASIS CONTROL  12/16/2019   Procedure: HEMOSTASIS CONTROL;  Surgeon: Napoleon Form, MD;  [facility];  Service: Endoscopy;;  Endoloop   HOLMIUM LASER APPLICATION Left 06/28/2014   Procedure: HOLMIUM LASER APPLICATION;  Surgeon: Sebastian Ache, MD;  Location: [facility];  Service: Urology;  Laterality: Left;   LITHOTRIPSY  years ago   POLYPECTOMY  12/16/2019   Procedure: POLYPECTOMY;  Surgeon: Napoleon Form, MD;  [facility];  Service: Endoscopy;;   STONE EXTRACTION WITH BASKET Left 06/28/2014   Procedure: STONE EXTRACTION WITH BASKET;  Surgeon: Sebastian Ache, MD;  [facility];  Service: Urology;  Laterality: Left;    Social History   Socioeconomic History   Marital status: Married    Spouse name: Talbert Forest   Number of children: 1   Years of education: Not on file    Highest education level: Not on file  Occupational History   Occupation: retired   Tobacco Use   Smoking status: Never    Passive exposure: Never   Smokeless tobacco: Never  Vaping Use   Vaping status: Never Used  Substance and Sexual Activity   Alcohol use: No   Drug use: No   Sexual activity: Not Currently  Other Topics Concern   Not on file  Social History Narrative   Lives with wife - they have a basement but don't use it   One daughter - husband is a Programmer, multimedia and they go to his church   Patient is paraplegic - uses wheelchair, sometimes motorized   He does still drive using hand controls.   Social Drivers of Corporate investment banker Strain: Low Risk  (06/08/2022)   Overall Financial Resource Strain (CARDIA)    Difficulty of Paying Living Expenses: Not hard at all  Food Insecurity: No Food Insecurity (08/01/2022)   Hunger Vital Sign    Worried About Running Out of Food in the Last Year: Never true    Ran Out of Food in the Last Year: Never true  Transportation Needs: No Transportation Needs (08/06/2022)   PRAPARE - Administrator, Civil Service (Medical): No    Lack of Transportation (Non-Medical): No  Physical Activity: Insufficiently Active (06/08/2022)   Exercise Vital Sign  Days of Exercise per Week: 3 days    Minutes of Exercise per Session: 30 min  Stress: No Stress Concern Present (06/08/2022)   [employer] of Occupational Health - Occupational Stress Questionnaire    Feeling of Stress : Not at all  Social Connections: Moderately Integrated (06/08/2022)   Social Connection and Isolation Panel [NHANES]    Frequency of Communication with Friends and Family: More than three times a week    Frequency of Social Gatherings with Friends and Family: More than three times a week    Attends Religious Services: More than 4 times per year    Active Member of [employer] or Organizations: No    Attends Banker Meetings: Never    Marital Status: Married   Catering manager Violence: Not At Risk (08/01/2022)   Humiliation, Afraid, Rape, and Kick questionnaire    Fear of Current or Ex-Partner: No    Emotionally Abused: No    Physically Abused: No    Sexually Abused: No    Outpatient Encounter Medications as of 03/19/2023  Medication Sig   amLODipine (NORVASC) 5 MG tablet Take 1 tablet (5 mg total) by mouth daily.   apixaban (ELIQUIS) 5 MG TABS tablet Take 1 tablet (5 mg total) by mouth 2 (two) times daily.   dofetilide (TIKOSYN) 500 MCG capsule Take 1 capsule (500 mcg total) by mouth 2 (two) times daily.   escitalopram (LEXAPRO) 20 MG tablet Take 1 tablet (20 mg total) by mouth daily.   FEROSUL 325 (65 Fe) MG tablet TAKE 1 TABLET DAILY   finasteride (PROSCAR) 5 MG tablet Take 1 tablet (5 mg total) by mouth daily. For urine flow   furosemide (LASIX) 20 MG tablet Take 1 tablet (20 mg total) by mouth daily.   gabapentin (NEURONTIN) 300 MG capsule TAKE 1 CAPSULE BY MOUTH 3 TIMES A DAY   lisinopril (ZESTRIL) 5 MG tablet Take 1 tablet (5 mg total) by mouth daily.   mirtazapine (REMERON) 7.5 MG tablet Take 1 tablet (7.5 mg total) by mouth at bedtime.   pantoprazole (PROTONIX) 40 MG tablet Take 1 tablet (40 mg total) by mouth 2 (two) times daily.   pravastatin (PRAVACHOL) 40 MG tablet Take 1 tablet (40 mg total) by mouth daily.   sucralfate (CARAFATE) 1 g tablet Take 1 tablet (1 g total) by mouth 2 (two) times daily.   tamsulosin (FLOMAX) 0.4 MG CAPS capsule Take 2 capsules (0.8 mg total) by mouth at bedtime. For urine flow and prostate   vitamin B-12 (CYANOCOBALAMIN) 1000 MCG tablet Take 1 tablet (1,000 mcg total) by mouth daily.   No facility-administered encounter medications on file as of 03/19/2023.    Allergies  Allergen Reactions   Valsartan Rash   Morphine And Codeine Other (See Comments)    Reaction:  Confusion/weakness/hallucinations    Pertinent ROS per HPI, otherwise unremarkable      Objective:  BP (!) 154/64   Pulse 74    Temp 97.9 F (36.6 C) (Temporal)   SpO2 93%    Wt Readings from Last 3 Encounters:  03/04/23 150 lb (68 kg)  12/17/22 150 lb (68 kg)  10/16/22 150 lb (68 kg)    Physical Exam Vitals and nursing note reviewed.  Constitutional:      Appearance: He is ill-appearing (chronically ill).  HENT:     Head: Normocephalic and atraumatic.     Nose: Nose normal.     Mouth/Throat:     Mouth: Mucous membranes are moist.  Pharynx: Oropharynx is clear.  Eyes:     Conjunctiva/sclera: Conjunctivae normal.     Pupils: Pupils are equal, round, and reactive to light.  Neck:     Trachea: Tracheostomy present.  Cardiovascular:     Rate and Rhythm: Normal rate. Rhythm irregularly irregular.  Musculoskeletal:     Cervical back: Normal range of motion and neck supple.  Skin:    General: Skin is warm and dry.     Capillary Refill: Capillary refill takes less than 2 seconds.  Neurological:     Mental Status: He is alert and oriented to person, place, and time. Mental status is at baseline.     Gait: Gait abnormal (WC bound).  Psychiatric:        Mood and Affect: Mood normal.        Behavior: Behavior normal.        Thought Content: Thought content normal.        Judgment: Judgment normal.      Results for orders placed or performed in visit on 03/04/23  CBC with Differential/Platelet   Collection Time: 03/04/23  3:19 PM  Result Value Ref Range   WBC 5.4 3.8 - 10.8 Thousand/uL   RBC 3.69 (L) 4.20 - 5.80 Million/uL   Hemoglobin 10.8 (L) 13.2 - 17.1 g/dL   HCT 16.1 (L) 09.6 - 04.5 %   MCV 92.1 80.0 - 100.0 fL   MCH 29.3 27.0 - 33.0 pg   MCHC 31.8 (L) 32.0 - 36.0 g/dL   RDW 40.9 81.1 - 91.4 %   Platelets 139 (L) 140 - 400 Thousand/uL   MPV 11.9 7.5 - 12.5 fL   Neutro Abs 4,023 1,500 - 7,800 cells/uL   Absolute Lymphocytes 875 850 - 3,900 cells/uL   Absolute Monocytes 254 200 - 950 cells/uL   Eosinophils Absolute 200 15 - 500 cells/uL   Basophils Absolute 49 0 - 200 cells/uL    Neutrophils Relative % 74.5 %   Total Lymphocyte 16.2 %   Monocytes Relative 4.7 %   Eosinophils Relative 3.7 %   Basophils Relative 0.9 %  Iron, TIBC and Ferritin Panel   Collection Time: 03/04/23  3:19 PM  Result Value Ref Range   Iron 55 50 - 180 mcg/dL   TIBC 782 956 - 213 mcg/dL (calc)   %SAT 18 (L) 20 - 48 % (calc)   Ferritin 29 24 - 380 ng/mL       Pertinent labs & imaging results that were available during my care of the patient were reviewed by me and considered in my medical decision making.  Assessment & Plan:  Sufyan was seen today for nausea and dizziness.  Diagnoses and all orders for this visit:  Nausea and vomiting in adult -     CMP14+EGFR -     CBC with Differential/Platelet  Dizziness -     CMP14+EGFR -     CBC with Differential/Platelet     Assessment and Plan    Medication-Induced Nausea and Vomiting Intermittent nausea and vomiting, particularly after taking morning medications. Symptoms include dizziness which has resolved. Likely due to taking multiple medications on an empty stomach. No blood in stool or vomit. No fever or chills. Differential diagnosis includes arrhythmia, dehydration, electrolyte imbalance or anemia. Discussed the importance of taking medications with food to mitigate symptoms. Explained that pantoprazole (Protonix) should be taken on an empty stomach, while other medications should be taken after breakfast. Informed about the potential risks of not following this regimen,  including continued nausea and vomiting. - Order CBC and electrolytes - Advise taking pantoprazole (Protonix) before breakfast - Advise taking remaining morning medications after breakfast - Follow up with primary care physician next month - Instruct to report if symptoms worsen or do not improve  Atrial Fibrillation Chronic atrial fibrillation. No new symptoms reported. - Continue current management and monitoring  General Health Maintenance General health  maintenance discussed. - Continue regular health check-ups and monitoring.          Continue all other maintenance medications.  Follow up plan: Return if symptoms worsen or fail to improve.   Continue healthy lifestyle choices, including diet (rich in fruits, vegetables, and lean proteins, and low in salt and simple carbohydrates) and exercise (at least 30 minutes of moderate physical activity daily).  Educational handout given for N/V  The above assessment and management plan was discussed with the patient. The patient verbalized understanding of and has agreed to the management plan. Patient is aware to call the clinic if they develop any new symptoms or if symptoms persist or worsen. Patient is aware when to return to the clinic for a follow-up visit. Patient educated on when it is appropriate to go to the emergency department.   Kari Baars, FNP-C [facility] [phone]

## 2023-03-20 ENCOUNTER — Ambulatory Visit: Payer: Self-pay | Admitting: Family Medicine

## 2023-03-20 LAB — CBC WITH DIFFERENTIAL/PLATELET
Basophils Absolute: 0.1 10*3/uL (ref 0.0–0.2)
Basos: 1 %
EOS (ABSOLUTE): 0.2 10*3/uL (ref 0.0–0.4)
Eos: 5 %
Hematocrit: 34.4 % — ABNORMAL LOW (ref 37.5–51.0)
Hemoglobin: 10.8 g/dL — ABNORMAL LOW (ref 13.0–17.7)
Immature Grans (Abs): 0 10*3/uL (ref 0.0–0.1)
Immature Granulocytes: 0 %
Lymphocytes Absolute: 1 10*3/uL (ref 0.7–3.1)
Lymphs: 21 %
MCH: 29 pg (ref 26.6–33.0)
MCHC: 31.4 g/dL — ABNORMAL LOW (ref 31.5–35.7)
MCV: 93 fL (ref 79–97)
Monocytes Absolute: 0.2 10*3/uL (ref 0.1–0.9)
Monocytes: 5 %
Neutrophils Absolute: 3.4 10*3/uL (ref 1.4–7.0)
Neutrophils: 68 %
Platelets: 128 10*3/uL — ABNORMAL LOW (ref 150–450)
RBC: 3.72 x10E6/uL — ABNORMAL LOW (ref 4.14–5.80)
RDW: 13 % (ref 11.6–15.4)
WBC: 4.9 10*3/uL (ref 3.4–10.8)

## 2023-03-20 LAB — CMP14+EGFR
ALT: 6 [IU]/L (ref 0–44)
AST: 13 [IU]/L (ref 0–40)
Albumin: 3.9 g/dL (ref 3.7–4.7)
Alkaline Phosphatase: 65 [IU]/L (ref 44–121)
BUN/Creatinine Ratio: 15 (ref 10–24)
BUN: 11 mg/dL (ref 8–27)
Bilirubin Total: 0.5 mg/dL (ref 0.0–1.2)
CO2: 31 mmol/L — ABNORMAL HIGH (ref 20–29)
Calcium: 8.9 mg/dL (ref 8.6–10.2)
Chloride: 104 mmol/L (ref 96–106)
Creatinine, Ser: 0.75 mg/dL — ABNORMAL LOW (ref 0.76–1.27)
Globulin, Total: 1.8 g/dL (ref 1.5–4.5)
Glucose: 96 mg/dL (ref 70–99)
Potassium: 3.8 mmol/L (ref 3.5–5.2)
Sodium: 149 mmol/L — ABNORMAL HIGH (ref 134–144)
Total Protein: 5.7 g/dL — ABNORMAL LOW (ref 6.0–8.5)
eGFR: 89 mL/min/{1.73_m2} (ref >59)

## 2023-03-20 NOTE — Telephone Encounter (Signed)
Copied from CRM [id number]. Topic: Clinical - Lab/Test Results >> Mar 20, 2023  4:53 PM Carloyn Manner C wrote: Reason for CRM: Patient is requesting lab results. Transferred to Nurse Triage line. Reason for Disposition  Caller requesting lab results  (Exception: Routine or non-urgent lab result.)  Answer Assessment - Initial Assessment Questions 1. REASON FOR CALL or QUESTION: "What is your reason for calling today?" or "How can I best help you?" or "What question do you have that I can help answer?"     Lab Results  Answer Assessment - Initial Assessment Questions 1. REASON FOR CALL or QUESTION: "What is your reason for calling today?" or "How can I best help you?" or "What question do you have that I can help answer?"     Lab Results 2. CALLER: Document the source of call. (e.g., laboratory, patient).     Patient  Protocols used: Information Only Call - No Triage-A-AH, PCP Call - No Triage-A-AH   Chief Complaint: Lab Result Symptoms: N/A Frequency: N/A Pertinent Negatives: Patient denies symptoms Disposition: [] ED /[] Urgent Care (no appt availability in office) / [] Appointment(In office/virtual)/ []  [facility]/ [] Home Care/ [] Refused Recommended Disposition /[] [facility]/ [x]  Follow-up with PCP Additional Notes: JH is a 82 year old male following up on a missed call for lab results, explained lab results to patient and relayed abnormalities. Included information about follow up actions but reinforced that PCP office would call to elaborate on any additional action going forward.

## 2023-03-20 NOTE — Addendum Note (Signed)
Addended by: Sonny Masters on: 03/20/2023 01:40 PM   Modules accepted: Orders

## 2023-03-31 ENCOUNTER — Emergency Department (HOSPITAL_COMMUNITY): Payer: Medicare Other

## 2023-03-31 ENCOUNTER — Ambulatory Visit
Admission: EM | Admit: 2023-03-31 | Discharge: 2023-03-31 | Disposition: A | Discharge status: Home / Self Care | Payer: Medicare Other | Attending: Family Medicine | Admitting: Family Medicine

## 2023-03-31 ENCOUNTER — Emergency Department (HOSPITAL_COMMUNITY)
Admission: EM | Admit: 2023-03-31 | Discharge: 2023-03-31 | Disposition: A | Discharge status: Home / Self Care | Payer: Medicare Other | Attending: Emergency Medicine | Admitting: Emergency Medicine

## 2023-03-31 ENCOUNTER — Encounter (HOSPITAL_COMMUNITY): Payer: Self-pay | Admitting: Emergency Medicine

## 2023-03-31 ENCOUNTER — Other Ambulatory Visit: Payer: Self-pay

## 2023-03-31 DIAGNOSIS — R112 Nausea with vomiting, unspecified: Secondary | ICD-10-CM

## 2023-03-31 DIAGNOSIS — I509 Heart failure, unspecified: Secondary | ICD-10-CM | POA: Insufficient documentation

## 2023-03-31 DIAGNOSIS — R140 Abdominal distension (gaseous): Secondary | ICD-10-CM | POA: Diagnosis not present

## 2023-03-31 DIAGNOSIS — R1084 Generalized abdominal pain: Secondary | ICD-10-CM | POA: Insufficient documentation

## 2023-03-31 DIAGNOSIS — K551 Chronic vascular disorders of intestine: Secondary | ICD-10-CM | POA: Diagnosis not present

## 2023-03-31 DIAGNOSIS — Z7901 Long term (current) use of anticoagulants: Secondary | ICD-10-CM | POA: Insufficient documentation

## 2023-03-31 DIAGNOSIS — R220 Localized swelling, mass and lump, head: Secondary | ICD-10-CM | POA: Diagnosis not present

## 2023-03-31 DIAGNOSIS — G610 Guillain-Barre syndrome: Secondary | ICD-10-CM | POA: Diagnosis not present

## 2023-03-31 DIAGNOSIS — R109 Unspecified abdominal pain: Secondary | ICD-10-CM

## 2023-03-31 DIAGNOSIS — R197 Diarrhea, unspecified: Secondary | ICD-10-CM | POA: Diagnosis not present

## 2023-03-31 DIAGNOSIS — Z86011 Personal history of benign neoplasm of the brain: Secondary | ICD-10-CM | POA: Insufficient documentation

## 2023-03-31 DIAGNOSIS — I9789 Other postprocedural complications and disorders of the circulatory system, not elsewhere classified: Secondary | ICD-10-CM

## 2023-03-31 DIAGNOSIS — I6782 Cerebral ischemia: Secondary | ICD-10-CM | POA: Diagnosis not present

## 2023-03-31 DIAGNOSIS — Z86018 Personal history of other benign neoplasm: Secondary | ICD-10-CM

## 2023-03-31 DIAGNOSIS — N281 Cyst of kidney, acquired: Secondary | ICD-10-CM | POA: Diagnosis not present

## 2023-03-31 DIAGNOSIS — I10 Essential (primary) hypertension: Secondary | ICD-10-CM | POA: Diagnosis not present

## 2023-03-31 DIAGNOSIS — T82330A Leakage of aortic (bifurcation) graft (replacement), initial encounter: Secondary | ICD-10-CM | POA: Insufficient documentation

## 2023-03-31 LAB — LIPASE, BLOOD: Lipase: 18 U/L (ref 11–51)

## 2023-03-31 LAB — CBC
HCT: 36.4 % — ABNORMAL LOW (ref 39.0–52.0)
Hemoglobin: 11.3 g/dL — ABNORMAL LOW (ref 13.0–17.0)
MCH: 29.2 pg (ref 26.0–34.0)
MCHC: 31 g/dL (ref 30.0–36.0)
MCV: 94.1 fL (ref 80.0–100.0)
Platelets: 144 10*3/uL — ABNORMAL LOW (ref 150–400)
RBC: 3.87 MIL/uL — ABNORMAL LOW (ref 4.22–5.81)
RDW: 14.5 % (ref 11.5–15.5)
WBC: 7 10*3/uL (ref 4.0–10.5)
nRBC: 0 % (ref 0.0–0.2)

## 2023-03-31 LAB — COMPREHENSIVE METABOLIC PANEL
ALT: 9 U/L (ref 0–44)
AST: 14 U/L — ABNORMAL LOW (ref 15–41)
Albumin: 3.8 g/dL (ref 3.5–5.0)
Alkaline Phosphatase: 51 U/L (ref 38–126)
Anion gap: 10 (ref 5–15)
BUN: 12 mg/dL (ref 8–23)
CO2: 27 mmol/L (ref 22–32)
Calcium: 9.2 mg/dL (ref 8.9–10.3)
Chloride: 105 mmol/L (ref 98–111)
Creatinine, Ser: 0.69 mg/dL (ref 0.61–1.24)
GFR, Estimated: >60 mL/min (ref >60)
Glucose, Bld: 112 mg/dL — ABNORMAL HIGH (ref 70–99)
Potassium: 3.5 mmol/L (ref 3.5–5.1)
Sodium: 142 mmol/L (ref 135–145)
Total Bilirubin: 0.8 mg/dL (ref <1.2)
Total Protein: 6.4 g/dL — ABNORMAL LOW (ref 6.5–8.1)

## 2023-03-31 MED ORDER — METOCLOPRAMIDE HCL 5 MG/ML IJ SOLN
10.0000 mg | Freq: Once | INTRAMUSCULAR | Status: DC
  Filled 2023-03-31: qty 2

## 2023-03-31 MED ORDER — ONDANSETRON 4 MG PO TBDP
4.0000 mg | ORAL_TABLET | Freq: Once | ORAL | Status: DC
  Filled 2023-03-31: qty 1

## 2023-03-31 MED ORDER — ONDANSETRON 4 MG PO TBDP: 4.0000 mg | ORAL_TABLET | Freq: Three times a day (TID) | ORAL | 0 refills | Status: DC | PRN

## 2023-03-31 MED ORDER — ONDANSETRON HCL 4 MG/2ML IJ SOLN
4.0000 mg | Freq: Once | INTRAMUSCULAR | Status: AC
  Administered 2023-03-31: 4 mg via INTRAVENOUS
  Filled 2023-03-31: qty 2

## 2023-03-31 MED ORDER — DIPHENHYDRAMINE HCL 50 MG/ML IJ SOLN
12.5000 mg | Freq: Once | INTRAMUSCULAR | Status: DC
  Filled 2023-03-31: qty 1

## 2023-03-31 MED ORDER — IOHEXOL 350 MG/ML SOLN
100.0000 mL | Freq: Once | INTRAVENOUS | Status: AC | PRN
  Administered 2023-03-31: 100 mL via INTRAVENOUS

## 2023-03-31 MED ORDER — ONDANSETRON HCL 4 MG/2ML IJ SOLN
4.0000 mg | Freq: Once | INTRAMUSCULAR | Status: AC
Start: 2023-03-31 — End: 2023-03-31
  Administered 2023-03-31: 4 mg via INTRAVENOUS
  Filled 2023-03-31: qty 2

## 2023-03-31 MED ORDER — ONDANSETRON 4 MG PO TBDP
4.0000 mg | ORAL_TABLET | Freq: Once | ORAL | Status: AC
  Administered 2023-03-31: 4 mg via ORAL

## 2023-03-31 NOTE — ED Notes (Signed)
Patient is being discharged from the Urgent Care and sent to the Emergency Department via POV . Per PA, patient is in need of higher level of care due to ABD pain/nausea. Patient is aware and verbalizes understanding of plan of care.  Vitals:   03/31/23 1142  BP: (!) 178/68  Pulse: 67  Resp: 18  Temp: 98.7 F (37.1 C)  SpO2: 96%

## 2023-03-31 NOTE — Discharge Instructions (Signed)
You were seen for your abdominal pain in the emergency department.  Your CT scan showed that there is an endoleak around your abdominal aortic aneurysm graft that you will need to follow-up with vascular surgery about.  At home, please take the Zofran that we prescribed you for your nausea and vomiting.    Check your MyChart online for the results of any tests that had not resulted by the time you left the emergency department.   Follow-up with your primary doctor in 2-3 days regarding your visit.    Return immediately to the emergency department if you experience any of the following: Worsening pain, vomiting despite the medication, or any other concerning symptoms.    Thank you for visiting our Emergency Department. It was a pleasure taking care of you today.

## 2023-03-31 NOTE — ED Provider Notes (Signed)
[place] EMERGENCY DEPARTMENT AT [facility] Provider Note   CSN: [id number] Arrival date & time: 03/31/23  1703     History  Chief Complaint  Patient presents with   Abdominal Pain    Theodore Mendoza is a 82 y.o. male.  82 year old male with a history of atrial fibrillation on Eliquis, AAA status post repair, CHF, Guillain-Barr, and meningioma who presents emergency department with nausea and vomiting.  Patient reports that over the past 24 hours has developed abdominal pain and nausea and vomiting.  Has difficulty characterizing the abdominal pain or localizing it.  Says that he has had frequent dry heaving in that time as well.  Last bowel movement was on Friday.  Not passed gas today.  Went to urgent care and was referred into the emergency department.  No other abdominal surgeries aside from the AAA surgery.       Home Medications Prior to Admission medications   Medication Sig Start Date End Date Taking? Authorizing Provider  ondansetron (ZOFRAN-ODT) 4 MG disintegrating tablet Take 1 tablet (4 mg total) by mouth every 8 (eight) hours as needed for nausea or vomiting. 03/31/23  Yes Rondel Baton, MD  amLODipine (NORVASC) 5 MG tablet Take 1 tablet (5 mg total) by mouth daily. 02/25/23   Mechele Claude, MD  apixaban (ELIQUIS) 5 MG TABS tablet Take 1 tablet (5 mg total) by mouth 2 (two) times daily. 02/25/23   Mechele Claude, MD  dofetilide (TIKOSYN) 500 MCG capsule Take 1 capsule (500 mcg total) by mouth 2 (two) times daily. 07/16/22   Mechele Claude, MD  escitalopram (LEXAPRO) 20 MG tablet Take 1 tablet (20 mg total) by mouth daily. 02/25/23   Mechele Claude, MD  FEROSUL 325 (65 Fe) MG tablet TAKE 1 TABLET DAILY 10/12/21   Marguerita Merles, Reuel Boom, MD  finasteride (PROSCAR) 5 MG tablet Take 1 tablet (5 mg total) by mouth daily. For urine flow 10/16/22   Mechele Claude, MD  furosemide (LASIX) 20 MG tablet Take 1 tablet (20 mg total) by mouth daily. 02/25/23    Mechele Claude, MD  gabapentin (NEURONTIN) 300 MG capsule TAKE 1 CAPSULE BY MOUTH 3 TIMES A DAY 02/25/23   Mechele Claude, MD  lisinopril (ZESTRIL) 5 MG tablet Take 1 tablet (5 mg total) by mouth daily. 02/25/23   Mechele Claude, MD  mirtazapine (REMERON) 7.5 MG tablet Take 1 tablet (7.5 mg total) by mouth at bedtime. 02/25/23   Mechele Claude, MD  pantoprazole (PROTONIX) 40 MG tablet Take 1 tablet (40 mg total) by mouth 2 (two) times daily. 02/25/23   Mechele Claude, MD  pravastatin (PRAVACHOL) 40 MG tablet Take 1 tablet (40 mg total) by mouth daily. 02/25/23   Mechele Claude, MD  sucralfate (CARAFATE) 1 g tablet Take 1 tablet (1 g total) by mouth 2 (two) times daily. 02/25/23   Mechele Claude, MD  tamsulosin (FLOMAX) 0.4 MG CAPS capsule Take 2 capsules (0.8 mg total) by mouth at bedtime. For urine flow and prostate 02/25/23   Mechele Claude, MD  vitamin B-12 (CYANOCOBALAMIN) 1000 MCG tablet Take 1 tablet (1,000 mcg total) by mouth daily. 12/18/19   Tyrone Nine, MD      Allergies    Valsartan and Morphine and codeine    Review of Systems   Review of Systems  Physical Exam Updated Vital Signs BP 108/67 (BP Location: Right Arm)   Pulse 68   Temp 99.1 F (37.3 C) (Oral)   Resp 17   Ht  5\' 8"  (1.727 m)   Wt 68 kg   SpO2 95%   BMI 22.79 kg/m  Physical Exam Vitals and nursing note reviewed.  Constitutional:      General: He is not in acute distress.    Appearance: He is well-developed.     Comments: Dry heaving during exam  HENT:     Head: Normocephalic and atraumatic.     Right Ear: External ear normal.     Left Ear: External ear normal.     Nose: Nose normal.  Eyes:     Extraocular Movements: Extraocular movements intact.     Conjunctiva/sclera: Conjunctivae normal.     Pupils: Pupils are equal, round, and reactive to light.  Cardiovascular:     Rate and Rhythm: Normal rate and regular rhythm.  Pulmonary:     Effort: Pulmonary effort is normal. No respiratory distress.   Abdominal:     General: There is no distension.     Palpations: Abdomen is soft. There is no mass.     Tenderness: There is abdominal tenderness (Diffuse). There is no guarding.  Musculoskeletal:     Cervical back: Normal range of motion and neck supple.  Skin:    General: Skin is warm and dry.  Neurological:     Mental Status: He is alert. Mental status is at baseline.  Psychiatric:        Mood and Affect: Mood normal.        Behavior: Behavior normal.     ED Results / Procedures / Treatments   Labs (all labs ordered are listed, but only abnormal results are displayed) Labs Reviewed  COMPREHENSIVE METABOLIC PANEL - Abnormal; Notable for the following components:      Result Value   Glucose, Bld 112 (*)    Total Protein 6.4 (*)    AST 14 (*)    All other components within normal limits  CBC - Abnormal; Notable for the following components:   RBC 3.87 (*)    Hemoglobin 11.3 (*)    HCT 36.4 (*)    Platelets 144 (*)    All other components within normal limits  LIPASE, BLOOD    EKG EKG Interpretation Date/Time:  Sunday March 31 2023 20:41:09 EST Ventricular Rate:  61 PR Interval:  188 QRS Duration:  104 QT Interval:  462 QTC Calculation: 466 R Axis:   21  Text Interpretation: Sinus rhythm Abnormal R-wave progression, early transition Confirmed by Vonita Moss [phone]) on 03/31/2023 11:58:01 PM  Radiology CT Angio Abd/Pel W and/or Wo Contrast Result Date: 03/31/2023 CLINICAL DATA:  Abdominal pain with nausea and vomiting, initial encounter EXAM: CTA ABDOMEN AND PELVIS WITHOUT AND WITH CONTRAST TECHNIQUE: Multidetector CT imaging of the abdomen and pelvis was performed using the standard protocol during bolus administration of intravenous contrast. Multiplanar reconstructed images and MIPs were obtained and reviewed to evaluate the vascular anatomy. RADIATION DOSE REDUCTION: This exam was performed according to the departmental dose-optimization program which  includes automated exposure control, adjustment of the mA and/or kV according to patient size and/or use of iterative reconstruction technique. CONTRAST:  OMNIPAQUE IOHEXOL 350 MG/ML SOLN COMPARISON:  05/10/2022 FINDINGS: VASCULAR Aorta: Abdominal aorta demonstrates evidence of an endovascular stent graft in satisfactory position. There is again noted a and endoleak on the arterial phase images just to the right of the iliac limbs best seen on image number 87 of series 8. Delayed images demonstrates significant increase in the degree of endoleak when compared with the arterial phase  images. This is greater than that seen on the prior exam. The aneurysm sac itself now measures 5.2 x 5.3 cm in greatest transverse and AP dimensions respectively. This is slightly larger than that seen on the prior exam at which time it measured 5.1 cm. Again this is likely related to lumbar collateralization. Celiac: Atherosclerotic plaque is noted at the origin of the celiac axis with mild narrowing. No significant progression from the prior exam is noted. Mild poststenotic dilatation is seen. SMA: Calcified plaque is noted at the origin of the superior mesenteric artery with mild stenosis. Renals: Both renal arteries are patent without evidence of aneurysm, dissection, vasculitis, fibromuscular dysplasia or significant stenosis. IMA: Patent secondary to retrograde filling. Inflow: Iliacs demonstrate heavy calcification. Adequate runoff from the stent graft is seen. Veins: No specific venous abnormality is noted. Review of the MIP images confirms the above findings. NON-VASCULAR Lower chest: Lung bases are free of acute infiltrate or sizable effusion. Hepatobiliary: No focal liver abnormality is seen. No gallstones, gallbladder wall thickening, or biliary dilatation. Pancreas: Unremarkable. No pancreatic ductal dilatation or surrounding inflammatory changes. Spleen: Normal in size without focal abnormality. Adrenals/Urinary  Tract: Adrenal glands are within normal limits bilaterally. Kidneys demonstrate a normal enhancement pattern. Rim calcified cyst is noted in the lower pole of the right kidney stable in appearance from the prior exam. No obstructive changes are seen. The bladder is decompressed. Stomach/Bowel: No obstructive or inflammatory changes of the colon are seen. The stomach is distended with air. Small bowel appears within normal limits. The appendix is unremarkable. Lymphatic: No lymphadenopathy is noted. Reproductive: Prostate is enlarged indenting upon the bladder. Other: No abdominal wall hernia or abnormality. No abdominopelvic ascites. Musculoskeletal: Degenerative changes of lumbar spine are noted. IMPRESSION: VASCULAR Endovascular stent repair in satisfactory position similar to that seen on the prior exam. Type 2 endoleak seen on the arterial phase images similar to that noted on the prior exam. Delayed imaging however demonstrates significant blooming in the perigraft space greater than that seen on the prior exam. Slight increase in the aneurysm sac size to 5.3 cm from 5.1 cm is noted. Vascular surgery consultation is recommended. NON-VASCULAR Chronic changes similar to that seen on the prior exam Electronically Signed   By: Alcide Clever M.D.   On: 03/31/2023 20:47   CT Head Wo Contrast Result Date: 03/31/2023 CLINICAL DATA:  82 year old male with periumbilical abdominal pain, nausea, vomiting. History of myocardial infarction and abdominal aortic aneurysm repair with stent graft. EXAM: CT HEAD WITHOUT CONTRAST TECHNIQUE: Contiguous axial images were obtained from the base of the skull through the vertex without intravenous contrast. RADIATION DOSE REDUCTION: This exam was performed according to the departmental dose-optimization program which includes automated exposure control, adjustment of the mA and/or kV according to patient size and/or use of iterative reconstruction technique. COMPARISON:  CT head  08/11/2020 FINDINGS: Brain: No intracranial hemorrhage, mass effect, or evidence of acute infarct. No hydrocephalus. No extra-axial fluid collection. Age-commensurate cerebral atrophy and chronic small vessel ischemic disease. 2.3 cm extra-axial mass abutting the left inferior cerebral hemisphere is not changed from prior using similar measuring technique. Vascular: No hyperdense vessel. Intracranial arterial calcification. Skull: No fracture or focal lesion. Sinuses/Orbits: No acute finding. Other: None. IMPRESSION: 1. No acute intracranial abnormality. 2. Unchanged 2.3 cm extra-axial mass abutting the left inferior cerebral hemisphere, likely a meningioma. Electronically Signed   By: Minerva Fester M.D.   On: 03/31/2023 20:42    Procedures Procedures    Medications Ordered  in ED Medications  ondansetron (ZOFRAN) injection 4 mg (4 mg Intravenous Given 03/31/23 1936)  iohexol (OMNIPAQUE) 350 MG/ML injection 100 mL (100 mLs Intravenous Contrast Given 03/31/23 1943)  ondansetron (ZOFRAN) injection 4 mg (4 mg Intravenous Given 03/31/23 2243)    ED Course/ Medical Decision Making/ A&P Clinical Course as of 04/01/23 1646  Sun Mar 31, 2023  2127 Dr Lenell Antu unavailable because he is operating. Evelene Croon PA available.  [RP]  2144 Dr Lenell Antu has reviewed the imaging. Feels that the patient is low risk and that this does not typically cause pain. Does not feel that the patient need emergency surgery. Will have the patient fu in clinic next week. Will see if admitted to [place]. Can continue his eliquis.  [RP]    Clinical Course User Index [RP] Rondel Baton, MD                                 Medical Decision Making Amount and/or Complexity of Data Reviewed Labs: ordered. Radiology: ordered.  Risk Prescription drug management.   Theodore Mendoza is a 82 y.o. male with comorbidities that complicate the patient evaluation including atrial fibrillation on Eliquis, AAA status post repair,  CHF, Guillain-Barr, and meningioma who presents emergency department with abdominal pain and nausea and vomiting.    Initial Ddx:  Bowel obstruction, gastroparesis, medication side effect, AAA expansion, AKI, electrolyte abnormality, pancreatitis  MDM/Course:  Patient resents emergency department with abdominal pain and nausea and vomiting.  Appears that this nausea and vomiting is subacute and has been happening for several weeks.  I do see that he is talk to his primary doctor about this.  He is convinced that it is related to medication.  I am concerned that it could be related to gastroparesis or some sort of autonomic dysfunction from his Guillain-Barr.  On the differential also would be a bowel obstruction.  He did mention that he had felt similarly when he was diagnosed with his AAA.  CTA was obtained which showed a type II endoleak that was discussed with vascular surgery.  They felt that no acute intervention was required but they would see him if he was admitted to the hospital at [facility].  Also obtained a CT of the head with his history of meningioma and to ensure that it had not grown causing his nausea and vomiting.  It appeared to be stable from prior.  Patient had multiple rounds of antiemetics and unfortunately was unable to tolerate p.o.  Discussed with him and his family member who was at the bedside and I recommended admission especially with his age and the fact that he has not been able to take his medications due to this nausea and vomiting.  He declined and requested to go AGAINST MEDICAL ADVICE even though he realizes this could lead to permanent disability or death should any life-threatening complications arise.  Stated that he understood and still wished to go.  Do feel that he has capacity to make this decision right now.  Prescribed antiemetics and instructed to follow-up with his vascular surgeon and primary doctor soon as possible.  This patient presents to the ED for  concern of complaints listed in HPI, this involves an extensive number of treatment options, and is a complaint that carries with it a high risk of complications and morbidity. Disposition including potential need for admission considered.   Dispo: DC Home. Return precautions  discussed including, but not limited to, those listed in the AVS. Allowed pt time to ask questions which were answered fully prior to dc.  Additional history obtained from family Records reviewed Outpatient Clinic Notes The following labs were independently interpreted: Chemistry and show no acute abnormality I independently reviewed the following imaging with scope of interpretation limited to determining acute life threatening conditions related to emergency care: CT Head and agree with the radiologist interpretation with the following exceptions: none   I personally reviewed and interpreted the pt's EKG: see above for interpretation  I have reviewed the patients home medications and made adjustments as needed Consults: Vascular Surgery Social Determinants of health:  Elderly  Portions of this note were generated with Scientist, clinical (histocompatibility and immunogenetics). Dictation errors may occur despite best attempts at proofreading.     Final Clinical Impression(s) / ED Diagnoses Final diagnoses:  Nausea and vomiting, unspecified vomiting type  Abdominal pain, unspecified abdominal location  Type II endoleak of aortic graft  Guillain Barr syndrome (HCC)  History of meningioma    Rx / DC Orders ED Discharge Orders          Ordered    ondansetron (ZOFRAN-ODT) 4 MG disintegrating tablet  Every 8 hours PRN        03/31/23 2212              Rondel Baton, MD 04/01/23 [phone]

## 2023-03-31 NOTE — ED Notes (Signed)
Encouraging patient to urinate for urine specimen. Patient refusing In and out cath. Patient states he is unable to void. Explained the importance of  obtaining a urine sample.

## 2023-03-31 NOTE — ED Notes (Signed)
Patient refusing In and out cath for urine specimen.

## 2023-03-31 NOTE — ED Notes (Signed)
Patient is being discharged from the Urgent Care and sent to the Emergency Department via POV . Per provider, patient is in need of higher level of care due to increasing abdominal pain. Patient is aware and verbalizes understanding of plan of care.  Vitals:   03/31/23 1142  BP: (!) 178/68  Pulse: 67  Resp: 18  Temp: 98.7 F (37.1 C)  SpO2: 96%

## 2023-03-31 NOTE — ED Provider Triage Note (Cosign Needed)
Emergency Medicine Provider Triage Evaluation Note  Theodore Mendoza , a 82 y.o. male  was evaluated in triage.  Pt complains of periumbilical abdominal pain along with nausea and vomiting.  Past medical history significant for MI, AAA with repair using stent graft.  He was seen at urgent care this morning, given Zofran, nausea resolved but his now worsened along with pain.  Review of Systems  Positive: Abdominal pain, nausea and vomiting Negative: Diarrhea, abdominal distention, dysuria  Physical Exam  BP (!) 171/60   Pulse 60   Temp 100.3 F (37.9 C) (Oral)   Resp 20   Ht 5\' 8"  (1.727 m)   Wt 68 kg   SpO2 94%   BMI 22.79 kg/m  Gen:   Awake, no distress   Resp:  Normal effort  MSK:   Moves extremities without difficulty  Other:    Medical Decision Making  Medically screening exam initiated at 6:28 PM.  Appropriate orders placed.  Theodore Mendoza was informed that the remainder of the evaluation will be completed by another provider, this initial triage assessment does not replace that evaluation, and the importance of remaining in the ED until their evaluation is complete.     Burgess Amor, PA-C 03/31/23 1831

## 2023-03-31 NOTE — ED Triage Notes (Signed)
Per pt daughter, pt was told to take his daily meds with food but pt did not take his daily meds with food x 1 day. Pt states since then he has been having some dry heaving and severe abdominal pain.

## 2023-03-31 NOTE — ED Notes (Signed)
Patient continues to complain of abdominal pain. Bowel sounds present, abdominal distension noted

## 2023-03-31 NOTE — ED Triage Notes (Signed)
Pt c/o abd pain with n/v since last night.  

## 2023-04-01 ENCOUNTER — Telehealth: Payer: Self-pay

## 2023-04-01 ENCOUNTER — Emergency Department (HOSPITAL_COMMUNITY)
Admission: EM | Admit: 2023-04-01 | Discharge: 2023-04-01 | Discharge status: Against Medical Advice | Payer: Medicare Other | Source: Home / Self Care

## 2023-04-01 NOTE — Telephone Encounter (Signed)
Left message for patient to contact the office. Going to offer appt with DOD for 12/2

## 2023-04-01 NOTE — Telephone Encounter (Signed)
Copied from CRM [id number]. Topic: Appointments - Appointment Scheduling >> Apr 01, 2023  1:54 PM Theodore Mendoza wrote: Patient/patient representative is calling to schedule an appointment. Refer to attachments for appointment information. Patient discharged from hospital last night. No appoints available until Jan 9, patient is waitlisted.  Please call [phone]

## 2023-04-02 ENCOUNTER — Ambulatory Visit: Payer: Self-pay | Admitting: Family Medicine

## 2023-04-02 ENCOUNTER — Encounter (HOSPITAL_COMMUNITY): Payer: Self-pay

## 2023-04-02 ENCOUNTER — Telehealth: Payer: Self-pay

## 2023-04-02 ENCOUNTER — Other Ambulatory Visit: Payer: Self-pay

## 2023-04-02 ENCOUNTER — Emergency Department (HOSPITAL_COMMUNITY)
Admission: EM | Admit: 2023-04-02 | Discharge: 2023-04-03 | Disposition: A | Discharge status: Home / Self Care | Payer: Medicare Other

## 2023-04-02 DIAGNOSIS — Z7901 Long term (current) use of anticoagulants: Secondary | ICD-10-CM | POA: Insufficient documentation

## 2023-04-02 DIAGNOSIS — R112 Nausea with vomiting, unspecified: Secondary | ICD-10-CM | POA: Insufficient documentation

## 2023-04-02 DIAGNOSIS — R0602 Shortness of breath: Secondary | ICD-10-CM | POA: Diagnosis not present

## 2023-04-02 DIAGNOSIS — Z20822 Contact with and (suspected) exposure to covid-19: Secondary | ICD-10-CM | POA: Diagnosis not present

## 2023-04-02 LAB — CBC
HCT: 36.9 % — ABNORMAL LOW (ref 39.0–52.0)
Hemoglobin: 11.7 g/dL — ABNORMAL LOW (ref 13.0–17.0)
MCH: 29.6 pg (ref 26.0–34.0)
MCHC: 31.7 g/dL (ref 30.0–36.0)
MCV: 93.4 fL (ref 80.0–100.0)
Platelets: 152 10*3/uL (ref 150–400)
RBC: 3.95 MIL/uL — ABNORMAL LOW (ref 4.22–5.81)
RDW: 14.5 % (ref 11.5–15.5)
WBC: 7.5 10*3/uL (ref 4.0–10.5)
nRBC: 0 % (ref 0.0–0.2)

## 2023-04-02 LAB — COMPREHENSIVE METABOLIC PANEL
ALT: 11 U/L (ref 0–44)
AST: 18 U/L (ref 15–41)
Albumin: 3.9 g/dL (ref 3.5–5.0)
Alkaline Phosphatase: 49 U/L (ref 38–126)
Anion gap: 8 (ref 5–15)
BUN: 13 mg/dL (ref 8–23)
CO2: 29 mmol/L (ref 22–32)
Calcium: 9.2 mg/dL (ref 8.9–10.3)
Chloride: 103 mmol/L (ref 98–111)
Creatinine, Ser: 0.74 mg/dL (ref 0.61–1.24)
GFR, Estimated: >60 mL/min (ref >60)
Glucose, Bld: 114 mg/dL — ABNORMAL HIGH (ref 70–99)
Potassium: 3.8 mmol/L (ref 3.5–5.1)
Sodium: 140 mmol/L (ref 135–145)
Total Bilirubin: 1.1 mg/dL (ref 0.0–1.2)
Total Protein: 6.5 g/dL (ref 6.5–8.1)

## 2023-04-02 LAB — TROPONIN I (HIGH SENSITIVITY): Troponin I (High Sensitivity): 11 ng/L (ref <18)

## 2023-04-02 LAB — RESP PANEL BY RT-PCR (RSV, FLU A&B, COVID)  RVPGX2
Influenza A by PCR: NEGATIVE
Influenza B by PCR: NEGATIVE
Resp Syncytial Virus by PCR: NEGATIVE
SARS Coronavirus 2 by RT PCR: NEGATIVE

## 2023-04-02 LAB — LIPASE, BLOOD: Lipase: 19 U/L (ref 11–51)

## 2023-04-02 MED ORDER — PROMETHAZINE HCL 12.5 MG PO TABS
12.5000 mg | ORAL_TABLET | Freq: Once | ORAL | Status: AC
  Administered 2023-04-02: 12.5 mg via ORAL
  Filled 2023-04-02: qty 1

## 2023-04-02 MED ORDER — PROMETHAZINE HCL 25 MG PO TABS: 12.5000 mg | ORAL_TABLET | Freq: Four times a day (QID) | ORAL | 0 refills | Status: AC | PRN

## 2023-04-02 NOTE — ED Provider Notes (Signed)
  Provider Note MRN:  [id number]  Arrival date & time: 04/03/23    ED Course and Medical Decision Making  Assumed care at shift change.  Recent abdominal pain with constipation, nausea, now having vomiting.  Recent reassuring CT imaging, awaiting urinalysis.  Feeling better after Phenergan .  2 AM: Patient continues to feel well after Phenergan , not having any abdominal pain.  Symptoms of nausea vomiting and diarrhea suggest more of a gastroenteritis, possibly the norovirus which has been going around.  I see that he had a recent CT scan that was overall reassuring, had evidence of an endoleak but this was discussed with vascular surgery and there was no indication for inpatient management of this.  Abdomen is completely soft and nontender, he is very much wanting to go home.  Strict return precautions provided.  Procedures  Final Clinical Impressions(s) / ED Diagnoses     ICD-10-CM   1. Nausea and vomiting, unspecified vomiting type  R11.2       ED Discharge Orders          Ordered    promethazine  (PHENERGAN ) 25 MG tablet  Every 8 hours PRN,   Status:  Discontinued        04/03/23 0229    promethazine  (PHENERGAN ) 25 MG tablet  Every 6 hours PRN        04/02/23 2333              Discharge Instructions      Please take a half a tablet of the nausea medication as needed and follow-up with your doctor.  Return to the ER for worsening symptoms.      Theodore Mendoza. Theadore, MD [facility] Emergency Medicine [facility] [email or web address]    Theadore Theodore HERO, MD 04/03/23 0230

## 2023-04-02 NOTE — Telephone Encounter (Signed)
 Copied from CRM [id number]. Topic: Clinical - Red Word Triage >> Apr 02, 2023  3:07 PM Chase C wrote: Red Word that prompted transfer to Nurse Triage: Patient states he is very nauseated and doesn't want to go to the hospital because it would take all night. Transferred to Nurse Line.   Chief Complaint: shortness of breath Symptoms: nausea Frequency: ongoing since 04/01/23  Disposition: [x] ED /[] Urgent Care (no appt availability in office) / [] Appointment(In office/virtual)/ []  [place] [facility]/ [] Home Care/ [] Refused Recommended Disposition /[] [place] [facility]/ []  Follow-up with PCP Additional Notes: The patient called to request an appointment with Dr. Zollie for his nausea and inability to eat.  He was audibly short of breath.  Inquired if he is always that short of breath and he stated that it has never been this bad.  Advised the patient to go to the ED to be further evaluated.  He agreed.     Reason for Disposition  [1] MODERATE difficulty breathing (e.g., speaks in phrases, SOB even at rest, pulse 100-120) AND [2] NEW-onset or WORSE than normal  Answer Assessment - Initial Assessment Questions 1. RESPIRATORY STATUS: Describe your breathing? (e.g., wheezing, shortness of breath, unable to speak, severe coughing)      Short of breath  2. ONSET: When did this breathing problem begin?      04/01/2023 3. PATTERN Does the difficult breathing come and go, or has it been constant since it started?      SOB while talking 4. SEVERITY: How bad is your breathing? (e.g., mild, moderate, severe)    - MILD: No SOB at rest, mild SOB with walking, speaks normally in sentences, can lie down, no retractions, pulse < 100.    - MODERATE: SOB at rest, SOB with minimal exertion and prefers to sit, cannot lie down flat, speaks in phrases, mild retractions, audible wheezing, pulse 100-120.    - SEVERE: Very SOB at rest, speaks in single words, struggling to breathe, sitting hunched  forward, retractions, pulse > 120      severe 5. RECURRENT SYMPTOM: Have you had difficulty breathing before? If Yes, ask: When was the last time? and What happened that time?      Not this bad  9. OTHER SYMPTOMS: Do you have any other symptoms? (e.g., dizziness, runny nose, cough, chest pain, fever)     Nausea  Protocols used: Breathing Difficulty-A-AH

## 2023-04-02 NOTE — Discharge Instructions (Addendum)
Please take a half a tablet of the nausea medication as needed and follow-up with your doctor.  Return to the ER for worsening symptoms.

## 2023-04-02 NOTE — ED Triage Notes (Addendum)
Patient come in POV, complaint of Nausea, Diarrhea and Difficulty breathing. Patient describes "Breathing as more shallow." Decrease appetite was seen on Saturday for the same symptoms. Alert and oriented x4. Stated diarrhea started Saturday night.

## 2023-04-02 NOTE — Telephone Encounter (Signed)
Per chart patient went to ER yesterday.

## 2023-04-02 NOTE — ED Provider Notes (Signed)
 [place] EMERGENCY DEPARTMENT AT [facility] Provider Note   CSN: [id number] Arrival date & time: 04/02/23  1600     History  Chief Complaint  Patient presents with   Nausea   Shortness of Breath    Theodore Mendoza is a 82 y.o. male.  1 old male with past medical history of atrial fibrillation on Eliquis  as well as AAA status postrepair presenting to the emergency department today with nausea, vomiting, and diarrhea.  The patient was evaluated here few days ago for similar complaints.  That point he was having some abdominal discomfort but had not had a bowel movement in quite some time.  He was prescribed Zofran .  The IV form seem to work.  Since he has been home he states he has been taking this and has not really been helping with the vomiting.  He is started to have loose stools now.  He has had some chills but denies any fevers.  He denies any significant abdominal pain.  He states that he is feeling better in regards that after having bowel movement.  He has had very minimal cough with this.  He also reports some mild shortness of breath with this.  He came back to the ER today primarily because of the nausea and vomiting.  He denies any blood in his vomit or stool.  Denies any melena.   Shortness of Breath Associated symptoms: vomiting        Home Medications Prior to Admission medications   Medication Sig Start Date End Date Taking? Authorizing Provider  promethazine  (PHENERGAN ) 25 MG tablet Take 0.5 tablets (12.5 mg total) by mouth every 6 (six) hours as needed for nausea or vomiting. 04/02/23  Yes Ula Prentice SAUNDERS, MD  amLODipine  (NORVASC ) 5 MG tablet Take 1 tablet (5 mg total) by mouth daily. 02/25/23   Zollie Lowers, MD  apixaban  (ELIQUIS ) 5 MG TABS tablet Take 1 tablet (5 mg total) by mouth 2 (two) times daily. 02/25/23   Zollie Lowers, MD  dofetilide  (TIKOSYN ) 500 MCG capsule Take 1 capsule (500 mcg total) by mouth 2 (two) times daily. 07/16/22   Zollie Lowers, MD  escitalopram  (LEXAPRO ) 20 MG tablet Take 1 tablet (20 mg total) by mouth daily. 02/25/23   Zollie Lowers, MD  FEROSUL 325 (65 Fe) MG tablet TAKE 1 TABLET DAILY 10/12/21   Castaneda Mayorga, Toribio, MD  finasteride  (PROSCAR ) 5 MG tablet Take 1 tablet (5 mg total) by mouth daily. For urine flow 10/16/22   Zollie Lowers, MD  furosemide  (LASIX ) 20 MG tablet Take 1 tablet (20 mg total) by mouth daily. 02/25/23   Zollie Lowers, MD  gabapentin  (NEURONTIN ) 300 MG capsule TAKE 1 CAPSULE BY MOUTH 3 TIMES A DAY 02/25/23   Zollie Lowers, MD  lisinopril  (ZESTRIL ) 5 MG tablet Take 1 tablet (5 mg total) by mouth daily. 02/25/23   Zollie Lowers, MD  mirtazapine  (REMERON ) 7.5 MG tablet Take 1 tablet (7.5 mg total) by mouth at bedtime. 02/25/23   Zollie Lowers, MD  ondansetron  (ZOFRAN -ODT) 4 MG disintegrating tablet Take 1 tablet (4 mg total) by mouth every 8 (eight) hours as needed for nausea or vomiting. 03/31/23   Yolande Lamar BROCKS, MD  pantoprazole  (PROTONIX ) 40 MG tablet Take 1 tablet (40 mg total) by mouth 2 (two) times daily. 02/25/23   Zollie Lowers, MD  pravastatin  (PRAVACHOL ) 40 MG tablet Take 1 tablet (40 mg total) by mouth daily. 02/25/23   Zollie Lowers, MD  sucralfate  (CARAFATE )  1 g tablet Take 1 tablet (1 g total) by mouth 2 (two) times daily. 02/25/23   Zollie Lowers, MD  tamsulosin  (FLOMAX ) 0.4 MG CAPS capsule Take 2 capsules (0.8 mg total) by mouth at bedtime. For urine flow and prostate 02/25/23   Zollie Lowers, MD  vitamin B-12 (CYANOCOBALAMIN ) 1000 MCG tablet Take 1 tablet (1,000 mcg total) by mouth daily. 12/18/19   Bryn Bernardino NOVAK, MD      Allergies    Valsartan and Morphine  and codeine    Review of Systems   Review of Systems  Respiratory:  Positive for shortness of breath.   Gastrointestinal:  Positive for nausea and vomiting.  All other systems reviewed and are negative.   Physical Exam Updated Vital Signs BP (!) 162/73   Pulse 73   Temp 97.8 F (36.6 C)  (Oral)   Resp 19   Ht 5' 8 (1.727 m)   Wt 68 kg   SpO2 96%   BMI 22.81 kg/m  Physical Exam Vitals and nursing note reviewed.   Gen: NAD Eyes: PERRL, EOMI HEENT: no oropharyngeal swelling Neck: trachea midline Resp: clear to auscultation bilaterally Card: RRR, no murmurs, rubs, or gallops Abd: nontender, nondistended Extremities: no calf tenderness, no edema Vascular: 2+ radial pulses bilaterally, 2+ DP pulses bilaterally Skin: no rashes Psyc: acting appropriately   ED Results / Procedures / Treatments   Labs (all labs ordered are listed, but only abnormal results are displayed) Labs Reviewed  COMPREHENSIVE METABOLIC PANEL - Abnormal; Notable for the following components:      Result Value   Glucose, Bld 114 (*)    All other components within normal limits  CBC - Abnormal; Notable for the following components:   RBC 3.95 (*)    Hemoglobin 11.7 (*)    HCT 36.9 (*)    All other components within normal limits  RESP PANEL BY RT-PCR (RSV, FLU A&B, COVID)  RVPGX2  LIPASE, BLOOD  URINALYSIS, ROUTINE W REFLEX MICROSCOPIC  TROPONIN I (HIGH SENSITIVITY)  TROPONIN I (HIGH SENSITIVITY)    EKG EKG Interpretation Date/Time:  Tuesday April 02 2023 22:08:53 EST Ventricular Rate:  62 PR Interval:  181 QRS Duration:  109 QT Interval:  480 QTC Calculation: 488 R Axis:   19  Text Interpretation: Sinus rhythm Borderline prolonged QT interval Confirmed by Ula Barter [phone]) on 04/02/2023 10:16:07 PM  Radiology No results found.  Procedures Procedures    Medications Ordered in ED Medications  promethazine  (PHENERGAN ) tablet 12.5 mg (12.5 mg Oral Given 04/02/23 2213)    ED Course/ Medical Decision Making/ A&P                                 Medical Decision Making 82 year old male with past medical history of AAA status post repair as well as Guillain-Barr syndrome and atrial fibrillation on Eliquis  presents the emergency department today with persistent nausea  and vomiting.  The patient did have a CT scan performed a few days ago.  He states that his symptoms are similar except for he is no longer having abdominal pain.  I will further evaluate him here with basic labs as well as an EKG and troponin to evaluate for atypical ACS.  Also obtain chest x-ray to evaluate for pulmonary infiltrates or pulmonary edema.  Will obtain a COVID and flu swab to evaluate for viral etiologies.  Given the patient's nausea, vomiting, and diarrhea now is possible this  is due to gastroenteritis.  He did just have a CT scan reports that he is not having any abdominal pain currently.  I will give him Phenergan  here to see if this helps with his nausea.  Will p.o. challenge if this does seem to help.  Hold off on repeating imaging at this time with him being pain-free at this time.  The patient's initial labs are reassuring.  He was feeling much better after the Phenergan .  Still waiting on viral testing as well as urinalysis for the patient.  We will attempt a p.o. challenge.  If the patient is able to tolerate p.o. and his workup is reassuring I think that he will be stable for discharge.  The patient's labs are reassuring.  On reassessment he states that he is feeling better after the Phenergan .  Will attempt p.o. challenge.  Waiting on his viral testing at this time.  I will reevaluate for ultimate disposition.  Amount and/or Complexity of Data Reviewed Labs: ordered.  Risk Prescription drug management.           Final Clinical Impression(s) / ED Diagnoses Final diagnoses:  Nausea and vomiting, unspecified vomiting type    Rx / DC Orders ED Discharge Orders          Ordered    promethazine  (PHENERGAN ) 25 MG tablet  Every 6 hours PRN        04/02/23 2333              Ula Prentice SAUNDERS, MD 04/02/23 2333

## 2023-04-02 NOTE — ED Notes (Signed)
Attempted to in&out cath patient. Unable to obtain urine sample. ER MD made aware

## 2023-04-02 NOTE — Transitions of Care (Post Inpatient/ED Visit) (Signed)
   04/02/2023  Name: Theodore Mendoza MRN: [id number] DOB: July 07, 1940  Today's TOC FU Call Status: Today's TOC FU Call Status:: Unsuccessful Call (1st Attempt) Unsuccessful Call (1st Attempt) Date: 04/02/23  Attempted to reach the patient regarding the most recent Inpatient/ED visit.  Follow Up Plan: Additional outreach attempts will be made to reach the patient to complete the Transitions of Care (Post Inpatient/ED visit) call.   Signature Julian Lemmings, LPN [facility] Nurse Health Advisor Direct Dial [phone]

## 2023-04-03 MED ORDER — PROMETHAZINE HCL 25 MG PO TABS: 25.0000 mg | ORAL_TABLET | Freq: Three times a day (TID) | ORAL | 0 refills | Status: DC | PRN

## 2023-04-04 NOTE — Transitions of Care (Post Inpatient/ED Visit) (Signed)
   04/04/2023  Name: Theodore Mendoza MRN: [id number] DOB: Jul 01, 1940  Today's TOC FU Call Status: Today's TOC FU Call Status:: Unsuccessful Call (2nd Attempt) Unsuccessful Call (1st Attempt) Date: 04/02/23 Unsuccessful Call (2nd Attempt) Date: 04/04/23  Attempted to reach the patient regarding the most recent Inpatient/ED visit.  Follow Up Plan: Additional outreach attempts will be made to reach the patient to complete the Transitions of Care (Post Inpatient/ED visit) call.   Coleen Cardiff, CMA  [facility] AWV Team Direct Dial: [phone]

## 2023-04-08 NOTE — Transitions of Care (Post Inpatient/ED Visit) (Signed)
 04/08/2023  Name: Theodore Mendoza MRN: [id number] DOB: 1940/06/03  Today's TOC FU Call Status: Today's TOC FU Call Status:: Successful TOC FU Call Completed Unsuccessful Call (1st Attempt) Date: 04/02/23 Unsuccessful Call (2nd Attempt) Date: 04/04/23 [facility] FU Call Complete Date: 04/08/23 Patient's Name and Date of Birth confirmed.  Transition Care Management Follow-up Telephone Call Date of Discharge: 04/01/23 Discharge Facility: [facility] ([facility]) Type of Discharge: Emergency Department Reason for ED Visit: Other: (nausea) How have you been since you were released from the hospital?: Better Any questions or concerns?: No  Items Reviewed: Did you receive and understand the discharge instructions provided?: No Medications obtained,verified, and reconciled?: Yes (Medications Reviewed) Any new allergies since your discharge?: No Dietary orders reviewed?: Yes Do you have support at home?: Yes People in Home: spouse  Medications Reviewed Today: Medications Reviewed Today     Reviewed by Emmitt Pan, LPN (Licensed Practical Nurse) on 04/08/23 at 1543  Med List Status: <None>   Medication Order Taking? Sig Documenting Provider Last Dose Status Informant  amLODipine  (NORVASC ) 5 MG tablet [id number] No Take 1 tablet (5 mg total) by mouth daily. Zollie Lowers, MD Taking Active   apixaban  (ELIQUIS ) 5 MG TABS tablet [id number] No Take 1 tablet (5 mg total) by mouth 2 (two) times daily. Zollie Lowers, MD Taking Active   dofetilide  (TIKOSYN ) 500 MCG capsule [id number] No Take 1 capsule (500 mcg total) by mouth 2 (two) times daily. Zollie Lowers, MD Taking Active Self  escitalopram  (LEXAPRO ) 20 MG tablet [id number] No Take 1 tablet (20 mg total) by mouth daily. Zollie Lowers, MD Taking Active   FEROSUL 325 [phone] Fe) MG tablet [id number] No TAKE 1 TABLET DAILY Eartha Flavors, Toribio, MD Taking Active Self  finasteride  (PROSCAR ) 5 MG tablet [id number] No Take 1 tablet (5 mg total) by mouth daily.  For urine flow Zollie Lowers, MD Taking Active   furosemide  (LASIX ) 20 MG tablet [id number] No Take 1 tablet (20 mg total) by mouth daily. Zollie Lowers, MD Taking Active   gabapentin  (NEURONTIN ) 300 MG capsule [id number] No TAKE 1 CAPSULE BY MOUTH 3 TIMES A DAY Stacks, Warren, MD Taking Active   lisinopril  (ZESTRIL ) 5 MG tablet [id number] No Take 1 tablet (5 mg total) by mouth daily. Zollie Lowers, MD Taking Active   mirtazapine  (REMERON ) 7.5 MG tablet [id number] No Take 1 tablet (7.5 mg total) by mouth at bedtime. Zollie Lowers, MD Taking Active   ondansetron  (ZOFRAN -ODT) 4 MG disintegrating tablet [id number]  Take 1 tablet (4 mg total) by mouth every 8 (eight) hours as needed for nausea or vomiting. Yolande Lamar BROCKS, MD  Active   pantoprazole  (PROTONIX ) 40 MG tablet [id number] No Take 1 tablet (40 mg total) by mouth 2 (two) times daily. Zollie Lowers, MD Taking Active   pravastatin  (PRAVACHOL ) 40 MG tablet [id number] No Take 1 tablet (40 mg total) by mouth daily. Zollie Lowers, MD Taking Active   promethazine  (PHENERGAN ) 25 MG tablet [id number]  Take 0.5 tablets (12.5 mg total) by mouth every 6 (six) hours as needed for nausea or vomiting. Ula Prentice SAUNDERS, MD  Active   sucralfate  (CARAFATE ) 1 g tablet [id number] No Take 1 tablet (1 g total) by mouth 2 (two) times daily. Zollie Lowers, MD Taking Active   tamsulosin  (FLOMAX ) 0.4 MG CAPS capsule [id number] No Take 2 capsules (0.8 mg total) by mouth at bedtime. For urine flow and prostate Zollie Lowers, MD Taking Active   vitamin B-12 (CYANOCOBALAMIN ) 1000 MCG tablet [id number] No Take 1  tablet (1,000 mcg total) by mouth daily. Bryn Bernardino NOVAK, MD Taking Active Self            Home Care and Equipment/Supplies: Were Home Health Services Ordered?: NA Any new equipment or medical supplies ordered?: NA  Functional Questionnaire: Do you need assistance with bathing/showering or dressing?: No Do you need assistance with meal preparation?: No Do you  need assistance with eating?: No Do you have difficulty maintaining continence: No Do you need assistance with getting out of bed/getting out of a chair/moving?: No Do you have difficulty managing or taking your medications?: No  Follow up appointments reviewed: PCP Follow-up appointment confirmed?: Yes Date of PCP follow-up appointment?: 04/09/23 Follow-up Provider: [facility] Follow-up appointment confirmed?: NA Do you need transportation to your follow-up appointment?: No Do you understand care options if your condition(s) worsen?: Yes-patient verbalized understanding    SIGNATURE Julian Lemmings, LPN [facility] Nurse Health Advisor Direct Dial [phone]

## 2023-04-09 ENCOUNTER — Encounter: Payer: Self-pay | Admitting: Family Medicine

## 2023-04-09 ENCOUNTER — Ambulatory Visit: Payer: Medicare Other | Admitting: Family Medicine

## 2023-04-09 VITALS — BP 130/63 | HR 62 | Temp 96.9°F

## 2023-04-09 DIAGNOSIS — R112 Nausea with vomiting, unspecified: Secondary | ICD-10-CM | POA: Diagnosis not present

## 2023-04-09 DIAGNOSIS — A09 Infectious gastroenteritis and colitis, unspecified: Secondary | ICD-10-CM | POA: Diagnosis not present

## 2023-04-09 MED ORDER — DIPHENOXYLATE-ATROPINE 2.5-0.025 MG PO TABS: 1.0000 | ORAL_TABLET | Freq: Four times a day (QID) | ORAL | 1 refills | Status: DC | PRN

## 2023-04-09 NOTE — Progress Notes (Signed)
 Subjective:  Patient ID: Theodore Mendoza, male    DOB: [birth year]  Age: 83 y.o. MRN: [id number]  CC: Hospitalization Follow-up (04/02/2023 - 04/03/2023 (9 hours)/[place] [facility] at [facility]- Cherre attending  Treatment team Nausea and vomiting, unspecified vomiting type)   HPI Theodore Mendoza presents for recheck of hospital stay for nausea,vomiting and diarrhea. Now taking med for nausea with resolution of sx. There is still a little diarrhea intermittently. He has a good appetite and denies abd pain. He went to  E.D. on 12 29 & 12/31 HE had CT abd & head that showed stable conditions only that were not sources of sx current at the time.      04/09/2023    2:33 PM 02/25/2023   11:13 AM 10/16/2022    9:55 AM  Depression screen PHQ 2/9  Decreased Interest 1 0 3  Down, Depressed, Hopeless 1 0 2  PHQ - 2 Score 2 0 5  Altered sleeping 0  3  Tired, decreased energy 3  3  Change in appetite 1  2  Feeling bad or failure about yourself  1  3  Trouble concentrating 0  1  Moving slowly or fidgety/restless 0  3  Suicidal thoughts 0  1  PHQ-9 Score 7  21  Difficult doing work/chores Extremely dIfficult  Very difficult    History Alice has a past medical history of AAA (abdominal aortic aneurysm) (HCC), Atrial fibrillation (HCC), Brain tumor (HCC), Cancer (HCC), Cardiac arrhythmia, CHF (congestive heart failure) (HCC), GERD (gastroesophageal reflux disease), Guillain-Barre syndrome (HCC) (02/13/1986), Headache, Hypertension, Kidney stones, and Myocardial infarction (HCC) (2007).   He has a past surgical history that includes Cystoscopy with stent placement (Left, 05/08/2014); Coronary angioplasty (1997); Eye surgery (Right, may 2015); gamma kniferadiation treatment (Feb 09 2014); Lithotripsy (years ago); Cystoscopy with retrograde pyelogram, ureteroscopy and stent placement (Left, 06/28/2014); Holmium laser application (Left, 06/28/2014); Stone extraction with basket (Left,  06/28/2014); Esophagogastroduodenoscopy (egd) with propofol  (N/A, 12/16/2019); Hemostasis clip placement (12/16/2019); Hemostasis control (12/16/2019); polypectomy (12/16/2019); Esophagogastroduodenoscopy (egd) with propofol  (N/A, 08/13/2020); Colonoscopy with propofol  (N/A, 08/14/2020); Esophagogastroduodenoscopy (egd) with propofol  (N/A, 09/16/2020); enteroscopy (N/A, 09/16/2020); Givens capsule study (N/A, 10/11/2020); Esophagogastroduodenoscopy (egd) with propofol  (N/A, 10/25/2020); enteroscopy (N/A, 10/25/2020); and Abdominal aortic endovascular stent graft (Bilateral, 04/04/2022).   His family history includes CAD in his father; Diabetes in his brother, mother, and sister; Lung cancer in his mother; Other in his maternal grandfather; Stroke in his sister.He reports that he has never smoked. He has never been exposed to tobacco smoke. He has never used smokeless tobacco. He reports that he does not drink alcohol and does not use drugs.    ROS Review of Systems  Constitutional: Negative.   HENT: Negative.    Eyes:  Negative for visual disturbance.  Respiratory:  Negative for cough and shortness of breath.   Cardiovascular:  Negative for chest pain and leg swelling.  Gastrointestinal:  Negative for abdominal pain, diarrhea, nausea and vomiting.  Genitourinary:  Negative for difficulty urinating.  Musculoskeletal:  Negative for arthralgias and myalgias.  Skin:  Negative for rash.  Neurological:  Negative for headaches.  Psychiatric/Behavioral:  Negative for sleep disturbance.     Objective:  BP 130/63   Pulse 62   Temp (!) 96.9 F (36.1 C)   SpO2 98%   BP Readings from Last 3 Encounters:  04/09/23 130/63  04/03/23 (!) 178/77  03/31/23 108/67    Wt Readings from Last 3 Encounters:  04/02/23 150  lb (68 kg)  03/31/23 149 lb 14.6 oz (68 kg)  03/04/23 150 lb (68 kg)     Physical Exam Vitals reviewed.  Constitutional:      General: He is not in acute distress.    Appearance: He is  well-developed.  HENT:     Head: Normocephalic and atraumatic.     Right Ear: External ear normal.     Left Ear: External ear normal.     Nose: Nose normal.     Mouth/Throat:     Pharynx: No oropharyngeal exudate or posterior oropharyngeal erythema.  Eyes:     Conjunctiva/sclera: Conjunctivae normal.     Pupils: Pupils are equal, round, and reactive to light.  Cardiovascular:     Rate and Rhythm: Normal rate and regular rhythm.     Heart sounds: Normal heart sounds. No murmur heard. Pulmonary:     Effort: Pulmonary effort is normal. No respiratory distress.     Breath sounds: Normal breath sounds. No wheezing or rales.  Abdominal:     Palpations: Abdomen is soft.     Tenderness: There is no abdominal tenderness.  Musculoskeletal:        General: Normal range of motion.     Cervical back: Normal range of motion and neck supple.  Skin:    General: Skin is warm and dry.  Neurological:     Mental Status: He is alert and oriented to person, place, and time.     Deep Tendon Reflexes: Reflexes are normal and symmetric.  Psychiatric:        Behavior: Behavior normal.        Thought Content: Thought content normal.        Judgment: Judgment normal.       Assessment & Plan:   Traxton was seen today for hospitalization follow-up.  Diagnoses and all orders for this visit:  Nausea and vomiting in adult  Diarrhea of infectious origin  Other orders -     diphenoxylate -atropine  (LOMOTIL ) 2.5-0.025 MG tablet; Take 1 tablet by mouth 4 (four) times daily as needed for diarrhea or loose stools.       I am having Tabias L. Diamant start on diphenoxylate -atropine . I am also having him maintain his cyanocobalamin , FeroSul, dofetilide , finasteride , amLODipine , apixaban , escitalopram , furosemide , gabapentin , lisinopril , mirtazapine , pantoprazole , pravastatin , sucralfate , tamsulosin , ondansetron , and promethazine .  Allergies as of 04/09/2023       Reactions   Valsartan Rash   Morphine  And  Codeine Other (See Comments)   Reaction:  Confusion/weakness/hallucinations        Medication List        Accurate as of April 09, 2023  9:15 PM. If you have any questions, ask your nurse or doctor.          amLODipine  5 MG tablet Commonly known as: NORVASC  Take 1 tablet (5 mg total) by mouth daily.   apixaban  5 MG Tabs tablet Commonly known as: Eliquis  Take 1 tablet (5 mg total) by mouth 2 (two) times daily.   cyanocobalamin  1000 MCG tablet Commonly known as: VITAMIN B12 Take 1 tablet (1,000 mcg total) by mouth daily.   diphenoxylate -atropine  2.5-0.025 MG tablet Commonly known as: Lomotil  Take 1 tablet by mouth 4 (four) times daily as needed for diarrhea or loose stools. Started by: Isiaah Cuervo   dofetilide  500 MCG capsule Commonly known as: TIKOSYN  Take 1 capsule (500 mcg total) by mouth 2 (two) times daily.   escitalopram  20 MG tablet Commonly known as: LEXAPRO  Take 1 tablet (  20 mg total) by mouth daily.   FeroSul 325 (65 Fe) MG tablet Generic drug: ferrous sulfate  TAKE 1 TABLET DAILY   finasteride  5 MG tablet Commonly known as: Proscar  Take 1 tablet (5 mg total) by mouth daily. For urine flow   furosemide  20 MG tablet Commonly known as: LASIX  Take 1 tablet (20 mg total) by mouth daily.   gabapentin  300 MG capsule Commonly known as: NEURONTIN  TAKE 1 CAPSULE BY MOUTH 3 TIMES A DAY   lisinopril  5 MG tablet Commonly known as: ZESTRIL  Take 1 tablet (5 mg total) by mouth daily.   mirtazapine  7.5 MG tablet Commonly known as: REMERON  Take 1 tablet (7.5 mg total) by mouth at bedtime.   ondansetron  4 MG disintegrating tablet Commonly known as: ZOFRAN -ODT Take 1 tablet (4 mg total) by mouth every 8 (eight) hours as needed for nausea or vomiting.   pantoprazole  40 MG tablet Commonly known as: PROTONIX  Take 1 tablet (40 mg total) by mouth 2 (two) times daily.   pravastatin  40 MG tablet Commonly known as: PRAVACHOL  Take 1 tablet (40 mg total) by  mouth daily.   promethazine  25 MG tablet Commonly known as: PHENERGAN  Take 0.5 tablets (12.5 mg total) by mouth every 6 (six) hours as needed for nausea or vomiting.   sucralfate  1 g tablet Commonly known as: CARAFATE  Take 1 tablet (1 g total) by mouth 2 (two) times daily.   tamsulosin  0.4 MG Caps capsule Commonly known as: FLOMAX  Take 2 capsules (0.8 mg total) by mouth at bedtime. For urine flow and prostate         Follow-up: No follow-ups on file.  Butler Der, M.D.

## 2023-04-15 ENCOUNTER — Inpatient Hospital Stay: Payer: Medicare Other

## 2023-04-15 ENCOUNTER — Inpatient Hospital Stay: Payer: Medicare Other | Attending: Hematology | Admitting: Hematology

## 2023-04-15 VITALS — BP 149/62 | HR 68 | Temp 96.1°F | Resp 20

## 2023-04-15 DIAGNOSIS — D509 Iron deficiency anemia, unspecified: Secondary | ICD-10-CM | POA: Insufficient documentation

## 2023-04-15 DIAGNOSIS — D508 Other iron deficiency anemias: Secondary | ICD-10-CM

## 2023-04-15 DIAGNOSIS — Z801 Family history of malignant neoplasm of trachea, bronchus and lung: Secondary | ICD-10-CM | POA: Diagnosis not present

## 2023-04-15 LAB — RETICULOCYTES
Immature Retic Fract: 8 % (ref 2.3–15.9)
RBC.: 3.78 MIL/uL — ABNORMAL LOW (ref 4.22–5.81)
Retic Count, Absolute: 62.7 10*3/uL (ref 19.0–186.0)
Retic Ct Pct: 1.7 % (ref 0.4–3.1)

## 2023-04-15 LAB — IRON AND TIBC
Iron: 97 ug/dL (ref 45–182)
Saturation Ratios: 28 % (ref 17.9–39.5)
TIBC: 350 ug/dL (ref 250–450)
UIBC: 253 ug/dL

## 2023-04-15 LAB — FERRITIN: Ferritin: 25 ng/mL (ref 24–336)

## 2023-04-15 LAB — LACTATE DEHYDROGENASE: LDH: 116 U/L (ref 98–192)

## 2023-04-15 LAB — VITAMIN B12: Vitamin B-12: 802 pg/mL (ref 180–914)

## 2023-04-15 LAB — FOLATE: Folate: 23.5 ng/mL (ref >5.9)

## 2023-04-15 NOTE — Progress Notes (Signed)
 [facility] [address], [place] [address]   Clinic Day:  04/15/2023  Referring physician: Severa Rock HERO, FNP  Patient Care Team: Zollie Lowers, MD as PCP - General (Family Medicine) Rosine Yancy ORN as Consulting Physician (Neurosurgery) Alvaro Ricardo KATHEE Raddle., MD as Consulting Physician (Urology) Billee Mliss BIRCH, [facility] (Pharmacist) Vicci Mcardle, OD (Optometry)   ASSESSMENT & PLAN:   Assessment:  1.  Iron  deficiency anemia: - He has been on iron  tablet twice daily for 6 months to a year.  Last transfusion in 1987. - CBC on 04/02/2023: Hb-11.7.  Last ferritin was 29 on 03/04/2023. - Denies any BRBPR/melena.  He is on Eliquis  for couple of years.  2.  Social/family history: - Lives at home with his wife.  Non-smoker. - Brother died of lung cancer.  Mother had lung cancer.  Plan:  1.  Iron  deficiency anemia: - His hemoglobin has been stable between 10-12.  However his most recent iron  in the first week of December did not show any improvement despite him being on iron  supplements for close to a year. - Will repeat ferritin, iron  panel today.  Will also check for other nutritional deficiencies including B12, folic acid , MMA, copper  levels.  Will check for bone marrow infiltrative process and hemolysis. - RTC 2 weeks for follow-up to discuss further plan.  If iron  levels are lower than 50, will consider parenteral iron  therapy.   Orders Placed This Encounter  Procedures   Iron  and TIBC ([facility] ONLY)    Standing Status:   Future    Number of Occurrences:   1    Expected Date:   04/15/2023    Expiration Date:   04/14/2024   Ferritin    Standing Status:   Future    Number of Occurrences:   1    Expected Date:   04/15/2023    Expiration Date:   04/14/2024   Vitamin B12    Standing Status:   Future    Number of Occurrences:   1    Expected Date:   04/15/2023    Expiration Date:   04/14/2024   Folate    Standing Status:   Future     Number of Occurrences:   1    Expected Date:   04/15/2023    Expiration Date:   04/14/2024   Methylmalonic acid, serum    Standing Status:   Future    Number of Occurrences:   1    Expected Date:   04/15/2023    Expiration Date:   04/14/2024   Copper , serum    Standing Status:   Future    Number of Occurrences:   1    Expected Date:   04/15/2023    Expiration Date:   04/14/2024   Immunofixation electrophoresis    Standing Status:   Future    Number of Occurrences:   1    Expected Date:   04/15/2023    Expiration Date:   04/14/2024   Protein electrophoresis, serum    Standing Status:   Future    Number of Occurrences:   1    Expected Date:   04/15/2023    Expiration Date:   04/14/2024   Kappa/lambda light chains    Standing Status:   Future    Number of Occurrences:   1    Expected Date:   04/15/2023    Expiration Date:   04/14/2024   Reticulocytes    Standing  Status:   Future    Number of Occurrences:   1    Expected Date:   04/15/2023    Expiration Date:   04/14/2024   Lactate dehydrogenase    Standing Status:   Future    Number of Occurrences:   1    Expected Date:   04/15/2023    Expiration Date:   04/14/2024      LILLETTE Hummingbird R Teague,acting as a scribe for Alean Stands, MD.,have documented all relevant documentation on the behalf of Alean Stands, MD,as directed by  Alean Stands, MD while in the presence of Alean Stands, MD.   I, Alean Stands MD, have reviewed the above documentation for accuracy and completeness, and I agree with the above.   Alean Stands, MD   1/13/20253:46 PM  CHIEF COMPLAINT/PURPOSE OF CONSULT:   Diagnosis: Iron  deficiency anemia  Current Therapy: Under workup  HISTORY OF PRESENT ILLNESS:   Theodore Mendoza is a 83 y.o. male presenting to clinic today for evaluation of anemia at the request of Zollie Lowers, MD.  Patient has a past medical history of meningioma, afib (on Eliquis ), AAA, Guillain Barre Syndrome, and CHF.  He has been seen by Dr. Eartha for IDA, with multiple colonoscopies done and no cause found for IDA. He is currently taking oral iron . He was recently seen in the ED on 03/31/23 and 04/02/23 for abdominal pain with nausea and vomiting. CTA showed type II endoleak and he was advised to follow up with vascular surgery. He was prescribed Phenergan  at discharge.   He was found to have abnormal CBC from 04/02/23 with low RBC at 3.95, low HGB at 11.7, and low HCT at 36.9. Iron  panel from 03/04/23 was abnormal with low iron  saturation at 18.   Today, he states that he is doing well overall. His appetite level is at 85%. His energy level is at 25%.   PAST MEDICAL HISTORY:   Past Medical History: Past Medical History:  Diagnosis Date   AAA (abdominal aortic aneurysm) (HCC)    Atrial fibrillation (HCC)    Brain tumor (HCC)    x2 (benign per pt)   Cancer (HCC)    melanoma removed twice (head and right forearm)   Cardiac arrhythmia    CHF (congestive heart failure) (HCC)    GERD (gastroesophageal reflux disease)    Guillain-Barre syndrome (HCC) 02/13/1986   Headache    Hypertension    Kidney stones    Myocardial infarction [facility]) 2007    Surgical History: Past Surgical History:  Procedure Laterality Date   ABDOMINAL AORTIC ENDOVASCULAR STENT GRAFT Bilateral 04/04/2022   Procedure: ABDOMINAL AORTIC ENDOVASCULAR STENT GRAFT;  Surgeon: Serene Gaile ORN, MD;  Location: [facility] OR;  Service: Vascular;  Laterality: Bilateral;   COLONOSCOPY WITH PROPOFOL  N/A 08/14/2020   Procedure: COLONOSCOPY WITH PROPOFOL ;  Surgeon: Golda Claudis PENNER, MD;  Location: [facility];  Service: Endoscopy;  Laterality: N/A;   CORONARY ANGIOPLASTY  1997   after mi   CYSTOSCOPY WITH RETROGRADE PYELOGRAM, URETEROSCOPY AND STENT PLACEMENT Left 06/28/2014   Procedure: 1ST STAGE CYSTOSCOPY/URETEROSCOPY/STENT PLACEMENT;  Surgeon: Ricardo Likens, MD;  Location: [facility] ORS;  Service: Urology;  Laterality: Left;   CYSTOSCOPY WITH STENT  PLACEMENT Left 05/08/2014   Procedure: CYSTOSCOPY, RETROGRADE PYELOGRAM WITH LEFT URETERAL STENT PLACEMENT;  Surgeon: Ricardo Likens, MD;  Location: [facility];  Service: Urology;  Laterality: Left;   ENTEROSCOPY N/A 09/16/2020   Procedure: PUSH ENTEROSCOPY;  Surgeon: Eartha Angelia Sieving, MD;  Location: [facility];  Service: Gastroenterology;  Laterality: N/A;   ENTEROSCOPY N/A 10/25/2020   Procedure: PUSH ENTEROSCOPY;  Surgeon: Eartha Angelia Sieving, MD;  Location: [facility];  Service: Gastroenterology;  Laterality: N/A;   ESOPHAGOGASTRODUODENOSCOPY (EGD) WITH PROPOFOL  N/A 12/16/2019   Procedure: ESOPHAGOGASTRODUODENOSCOPY (EGD) WITH PROPOFOL ;  Surgeon: Shila Gustav GAILS, MD;  Location: [facility];  Service: Endoscopy;  Laterality: N/A;   ESOPHAGOGASTRODUODENOSCOPY (EGD) WITH PROPOFOL  N/A 08/13/2020   Procedure: ESOPHAGOGASTRODUODENOSCOPY (EGD) WITH PROPOFOL ;  Surgeon: Golda Claudis PENNER, MD;  [facility];  Service: Endoscopy;  Laterality: N/A;   ESOPHAGOGASTRODUODENOSCOPY (EGD) WITH PROPOFOL  N/A 09/16/2020   Procedure: ESOPHAGOGASTRODUODENOSCOPY (EGD) WITH PROPOFOL ;  Surgeon: Eartha Angelia Sieving, MD;  [facility]: [facility];  Service: Gastroenterology;  Laterality: N/A;  1:55   ESOPHAGOGASTRODUODENOSCOPY (EGD) WITH PROPOFOL  N/A 10/25/2020   Procedure: ESOPHAGOGASTRODUODENOSCOPY (EGD) WITH PROPOFOL ;  Surgeon: Eartha Angelia Sieving, MD;  [facility];  Service: Gastroenterology;  Laterality: N/A;  2:00   EYE SURGERY Right may 2015   growth removed, july 2015left eye cataract removed, right eye catarct removed   gamma kniferadiation treatment  Feb 09 2014   [facility]   GIVENS CAPSULE STUDY N/A 10/11/2020   Procedure: GIVENS CAPSULE STUDY;  Surgeon: Eartha Angelia Sieving, MD;  [facility]: [facility];  Service: Gastroenterology;  Laterality: N/A;  7:30   HEMOSTASIS CLIP PLACEMENT  12/16/2019   Procedure: HEMOSTASIS CLIP PLACEMENT;  Surgeon:  Shila Gustav GAILS, MD;  [facility]: [facility];  Service: Endoscopy;;   HEMOSTASIS CONTROL  12/16/2019   Procedure: HEMOSTASIS CONTROL;  Surgeon: Shila Gustav GAILS, MD;  [facility]: [facility];  Service: Endoscopy;;  Endoloop   HOLMIUM LASER APPLICATION Left 06/28/2014   Procedure: HOLMIUM LASER APPLICATION;  Surgeon: Ricardo Likens, MD;  Location: [facility];  Service: Urology;  Laterality: Left;   LITHOTRIPSY  years ago   POLYPECTOMY  12/16/2019   Procedure: POLYPECTOMY;  Surgeon: Shila Gustav GAILS, MD;  [facility];  Service: Endoscopy;;   STONE EXTRACTION WITH BASKET Left 06/28/2014   Procedure: STONE EXTRACTION WITH BASKET;  Surgeon: Ricardo Likens, MD;  Location: [facility];  Service: Urology;  Laterality: Left;    Social History: Social History   Socioeconomic History   Marital status: Married    Spouse name: Orlean   Number of children: 1   Years of education: Not on file   Highest education level: Not on file  Occupational History   Occupation: retired   Tobacco Use   Smoking status: Never    Passive exposure: Never   Smokeless tobacco: Never  Vaping Use   Vaping status: Never Used  Substance and Sexual Activity   Alcohol use: No   Drug use: No   Sexual activity: Not Currently  Other Topics Concern   Not on file  Social History Narrative   Lives with wife - they have a basement but don't use it   One daughter - husband is a programmer, multimedia and they go to his church   Patient is paraplegic - uses wheelchair, sometimes motorized   He does still drive using hand controls.   Social Drivers of Corporate Investment Banker Strain: Low Risk  (06/08/2022)   Overall Financial Resource Strain (CARDIA)    Difficulty of Paying Living Expenses: Not hard at all  Food Insecurity: No Food Insecurity (08/01/2022)   Hunger Vital Sign    Worried About Running Out of Food in the Last Year: Never true    Ran Out of Food in  the Last Year: Never true  Transportation Needs: No  Transportation Needs (08/06/2022)   PRAPARE - Administrator, Civil Service (Medical): No    Lack of Transportation (Non-Medical): No  Physical Activity: Insufficiently Active (06/08/2022)   Exercise Vital Sign    Days of Exercise per Week: 3 days    Minutes of Exercise per Session: 30 min  Stress: No Stress Concern Present (06/08/2022)   [employer] of Occupational Health - Occupational Stress Questionnaire    Feeling of Stress : Not at all  Social Connections: Moderately Integrated (06/08/2022)   Social Connection and Isolation Panel [NHANES]    Frequency of Communication with Friends and Family: More than three times a week    Frequency of Social Gatherings with Friends and Family: More than three times a week    Attends Religious Services: More than 4 times per year    Active Member of [employer] or Organizations: No    Attends Banker Meetings: Never    Marital Status: Married  Catering Manager Violence: Not At Risk (08/01/2022)   Humiliation, Afraid, Rape, and Kick questionnaire    Fear of Current or Ex-Partner: No    Emotionally Abused: No    Physically Abused: No    Sexually Abused: No    Family History: Family History  Problem Relation Age of Onset   Diabetes Mother    Lung cancer Mother    CAD Father    Diabetes Brother    Diabetes Sister    Stroke Sister    Other Maternal Grandfather        brain tumor   Colon cancer Neg Hx    Pancreatic cancer Neg Hx    Esophageal cancer Neg Hx     Current Medications:  Current Outpatient Medications:    amLODipine  (NORVASC ) 5 MG tablet, Take 1 tablet (5 mg total) by mouth daily., Disp: 90 tablet, Rfl: 3   apixaban  (ELIQUIS ) 5 MG TABS tablet, Take 1 tablet (5 mg total) by mouth 2 (two) times daily., Disp: 180 tablet, Rfl: 3   diphenoxylate -atropine  (LOMOTIL ) 2.5-0.025 MG tablet, Take 1 tablet by mouth 4 (four) times daily as needed for diarrhea or loose stools., Disp: 40 tablet, Rfl: 1   dofetilide  (TIKOSYN )  500 MCG capsule, Take 1 capsule (500 mcg total) by mouth 2 (two) times daily., Disp: 180 capsule, Rfl: 3   escitalopram  (LEXAPRO ) 20 MG tablet, Take 1 tablet (20 mg total) by mouth daily., Disp: 90 tablet, Rfl: 3   FEROSUL 325 (65 Fe) MG tablet, TAKE 1 TABLET DAILY, Disp: 90 tablet, Rfl: 0   finasteride  (PROSCAR ) 5 MG tablet, Take 1 tablet (5 mg total) by mouth daily. For urine flow, Disp: 90 tablet, Rfl: 3   furosemide  (LASIX ) 20 MG tablet, Take 1 tablet (20 mg total) by mouth daily., Disp: 90 tablet, Rfl: 3   gabapentin  (NEURONTIN ) 300 MG capsule, TAKE 1 CAPSULE BY MOUTH 3 TIMES A DAY, Disp: 360 capsule, Rfl: 3   lisinopril  (ZESTRIL ) 5 MG tablet, Take 1 tablet (5 mg total) by mouth daily., Disp: 90 tablet, Rfl: 3   mirtazapine  (REMERON ) 7.5 MG tablet, Take 1 tablet (7.5 mg total) by mouth at bedtime., Disp: 90 tablet, Rfl: 3   ondansetron  (ZOFRAN -ODT) 4 MG disintegrating tablet, Take 1 tablet (4 mg total) by mouth every 8 (eight) hours as needed for nausea or vomiting., Disp: 12 tablet, Rfl: 0   pantoprazole  (PROTONIX ) 40 MG tablet, Take 1 tablet (40 mg total) by  mouth 2 (two) times daily., Disp: 180 tablet, Rfl: 3   pravastatin  (PRAVACHOL ) 40 MG tablet, Take 1 tablet (40 mg total) by mouth daily., Disp: 90 tablet, Rfl: 3   promethazine  (PHENERGAN ) 25 MG tablet, Take 0.5 tablets (12.5 mg total) by mouth every 6 (six) hours as needed for nausea or vomiting., Disp: 12 tablet, Rfl: 0   sucralfate  (CARAFATE ) 1 g tablet, Take 1 tablet (1 g total) by mouth 2 (two) times daily., Disp: 180 tablet, Rfl: 3   tamsulosin  (FLOMAX ) 0.4 MG CAPS capsule, Take 2 capsules (0.8 mg total) by mouth at bedtime. For urine flow and prostate, Disp: 180 capsule, Rfl: 3   vitamin B-12 (CYANOCOBALAMIN ) 1000 MCG tablet, Take 1 tablet (1,000 mcg total) by mouth daily., Disp: 30 tablet, Rfl: 2   Allergies: Allergies  Allergen Reactions   Valsartan Rash   Morphine  And Codeine Other (See Comments)    Reaction:   Confusion/weakness/hallucinations    REVIEW OF SYSTEMS:   Review of Systems  Constitutional:  Negative for chills, fatigue and fever.  HENT:   Negative for lump/mass, mouth sores, nosebleeds, sore throat and trouble swallowing.   Eyes:  Negative for eye problems.  Respiratory:  Positive for shortness of breath. Negative for cough.   Cardiovascular:  Negative for chest pain, leg swelling and palpitations.  Gastrointestinal:  Positive for diarrhea and nausea. Negative for abdominal pain, constipation and vomiting.  Genitourinary:  Negative for bladder incontinence, difficulty urinating, dysuria, frequency, hematuria and nocturia.   Musculoskeletal:  Negative for arthralgias, back pain, flank pain, myalgias and neck pain.  Skin:  Negative for itching and rash.  Neurological:  Positive for numbness. Negative for dizziness and headaches.  Hematological:  Does not bruise/bleed easily.  Psychiatric/Behavioral:  Negative for depression, sleep disturbance and suicidal ideas. The patient is not nervous/anxious.   All other systems reviewed and are negative.    VITALS:   Blood pressure (!) 149/62, pulse 68, temperature (!) 96.1 F (35.6 C), temperature source Tympanic, resp. rate 20, SpO2 98%.  Wt Readings from Last 3 Encounters:  04/02/23 150 lb (68 kg)  03/31/23 149 lb 14.6 oz (68 kg)  03/04/23 150 lb (68 kg)    There is no height or weight on file to calculate BMI.   PHYSICAL EXAM:   Physical Exam Vitals and nursing note reviewed. Exam conducted with a chaperone present.  Constitutional:      Appearance: Normal appearance.  Cardiovascular:     Rate and Rhythm: Normal rate and regular rhythm.     Pulses: Normal pulses.     Heart sounds: Normal heart sounds.  Pulmonary:     Effort: Pulmonary effort is normal.     Breath sounds: Normal breath sounds.  Abdominal:     Palpations: Abdomen is soft. There is no hepatomegaly, splenomegaly or mass.     Tenderness: There is no abdominal  tenderness.  Musculoskeletal:     Right lower leg: No edema.     Left lower leg: No edema.  Lymphadenopathy:     Cervical: No cervical adenopathy.     Right cervical: No superficial, deep or posterior cervical adenopathy.    Left cervical: No superficial, deep or posterior cervical adenopathy.     Upper Body:     Right upper body: No supraclavicular or axillary adenopathy.     Left upper body: No supraclavicular or axillary adenopathy.  Neurological:     General: No focal deficit present.     Mental Status: He is  alert and oriented to person, place, and time.  Psychiatric:        Mood and Affect: Mood normal.        Behavior: Behavior normal.     LABS:   CBC    Component Value Date/Time   WBC 7.5 04/02/2023 1801   RBC 3.78 (L) 04/15/2023 1407   RBC 3.95 (L) 04/02/2023 1801   HGB 11.7 (L) 04/02/2023 1801   HGB 10.8 (L) 03/19/2023 1218   HCT 36.9 (L) 04/02/2023 1801   HCT 34.4 (L) 03/19/2023 1218   PLT 152 04/02/2023 1801   PLT 128 (L) 03/19/2023 1218   MCV 93.4 04/02/2023 1801   MCV 93 03/19/2023 1218   MCH 29.6 04/02/2023 1801   MCHC 31.7 04/02/2023 1801   RDW 14.5 04/02/2023 1801   RDW 13.0 03/19/2023 1218   LYMPHSABS 1.0 03/19/2023 1218   MONOABS 0.3 12/17/2022 1018   EOSABS 0.2 03/19/2023 1218   BASOSABS 0.1 03/19/2023 1218    CMP    Component Value Date/Time   NA 140 04/02/2023 1801   NA 149 (H) 03/19/2023 1218   K 3.8 04/02/2023 1801   CL 103 04/02/2023 1801   CO2 29 04/02/2023 1801   GLUCOSE 114 (H) 04/02/2023 1801   BUN 13 04/02/2023 1801   BUN 11 03/19/2023 1218   CREATININE 0.74 04/02/2023 1801   CALCIUM 9.2 04/02/2023 1801   PROT 6.5 04/02/2023 1801   PROT 5.7 (L) 03/19/2023 1218   ALBUMIN 3.9 04/02/2023 1801   ALBUMIN 3.9 03/19/2023 1218   AST 18 04/02/2023 1801   ALT 11 04/02/2023 1801   ALKPHOS 49 04/02/2023 1801   BILITOT 1.1 04/02/2023 1801   BILITOT 0.5 03/19/2023 1218   GFRNONAA >60 04/02/2023 1801   GFRAA 97 05/06/2020 1057     No results found for: CEA1, CEA / No results found for: CEA1, CEA Lab Results  Component Value Date   PSA1 3.8 02/25/2023   No results found for: CAN199 No results found for: CAN125  No results found for: STEPHANY RINGS, A1GS, A2GS, EARLA JOANNIE KNIGHTS, MSPIKE, SPEI Lab Results  Component Value Date   TIBC 350 04/15/2023   TIBC 312 03/04/2023   TIBC 307 06/11/2022   FERRITIN 25 04/15/2023   FERRITIN 29 03/04/2023   FERRITIN 27 06/11/2022   IRONPCTSAT 28 04/15/2023   IRONPCTSAT 18 (L) 03/04/2023   IRONPCTSAT 36 06/11/2022   Lab Results  Component Value Date   LDH 116 04/15/2023     STUDIES:   CT Angio Abd/Pel W and/or Wo Contrast Result Date: 03/31/2023 CLINICAL DATA:  Abdominal pain with nausea and vomiting, initial encounter EXAM: CTA ABDOMEN AND PELVIS WITHOUT AND WITH CONTRAST TECHNIQUE: Multidetector CT imaging of the abdomen and pelvis was performed using the standard protocol during bolus administration of intravenous contrast. Multiplanar reconstructed images and MIPs were obtained and reviewed to evaluate the vascular anatomy. RADIATION DOSE REDUCTION: This exam was performed according to the departmental dose-optimization program which includes automated exposure control, adjustment of the mA and/or kV according to patient size and/or use of iterative reconstruction technique. CONTRAST:  OMNIPAQUE  IOHEXOL  350 MG/ML SOLN COMPARISON:  05/10/2022 FINDINGS: VASCULAR Aorta: Abdominal aorta demonstrates evidence of an endovascular stent graft in satisfactory position. There is again noted a and endoleak on the arterial phase images just to the right of the iliac limbs best seen on image number 87 of series 8. Delayed images demonstrates significant increase in the degree of endoleak when compared with the  arterial phase images. This is greater than that seen on the prior exam. The aneurysm sac itself now measures 5.2 x 5.3 cm in  greatest transverse and AP dimensions respectively. This is slightly larger than that seen on the prior exam at which time it measured 5.1 cm. Again this is likely related to lumbar collateralization. Celiac: Atherosclerotic plaque is noted at the origin of the celiac axis with mild narrowing. No significant progression from the prior exam is noted. Mild poststenotic dilatation is seen. SMA: Calcified plaque is noted at the origin of the superior mesenteric artery with mild stenosis. Renals: Both renal arteries are patent without evidence of aneurysm, dissection, vasculitis, fibromuscular dysplasia or significant stenosis. IMA: Patent secondary to retrograde filling. Inflow: Iliacs demonstrate heavy calcification. Adequate runoff from the stent graft is seen. Veins: No specific venous abnormality is noted. Review of the MIP images confirms the above findings. NON-VASCULAR Lower chest: Lung bases are free of acute infiltrate or sizable effusion. Hepatobiliary: No focal liver abnormality is seen. No gallstones, gallbladder wall thickening, or biliary dilatation. Pancreas: Unremarkable. No pancreatic ductal dilatation or surrounding inflammatory changes. Spleen: Normal in size without focal abnormality. Adrenals/Urinary Tract: Adrenal glands are within normal limits bilaterally. Kidneys demonstrate a normal enhancement pattern. Rim calcified cyst is noted in the lower pole of the right kidney stable in appearance from the prior exam. No obstructive changes are seen. The bladder is decompressed. Stomach/Bowel: No obstructive or inflammatory changes of the colon are seen. The stomach is distended with air. Small bowel appears within normal limits. The appendix is unremarkable. Lymphatic: No lymphadenopathy is noted. Reproductive: Prostate is enlarged indenting upon the bladder. Other: No abdominal wall hernia or abnormality. No abdominopelvic ascites. Musculoskeletal: Degenerative changes of lumbar spine are noted.  IMPRESSION: VASCULAR Endovascular stent repair in satisfactory position similar to that seen on the prior exam. Type 2 endoleak seen on the arterial phase images similar to that noted on the prior exam. Delayed imaging however demonstrates significant blooming in the perigraft space greater than that seen on the prior exam. Slight increase in the aneurysm sac size to 5.3 cm from 5.1 cm is noted. Vascular surgery consultation is recommended. NON-VASCULAR Chronic changes similar to that seen on the prior exam Electronically Signed   By: Oneil Devonshire M.D.   On: 03/31/2023 20:47   CT Head Wo Contrast Result Date: 03/31/2023 CLINICAL DATA:  83 year old male with periumbilical abdominal pain, nausea, vomiting. History of myocardial infarction and abdominal aortic aneurysm repair with stent graft. EXAM: CT HEAD WITHOUT CONTRAST TECHNIQUE: Contiguous axial images were obtained from the base of the skull through the vertex without intravenous contrast. RADIATION DOSE REDUCTION: This exam was performed according to the departmental dose-optimization program which includes automated exposure control, adjustment of the mA and/or kV according to patient size and/or use of iterative reconstruction technique. COMPARISON:  CT head 08/11/2020 FINDINGS: Brain: No intracranial hemorrhage, mass effect, or evidence of acute infarct. No hydrocephalus. No extra-axial fluid collection. Age-commensurate cerebral atrophy and chronic small vessel ischemic disease. 2.3 cm extra-axial mass abutting the left inferior cerebral hemisphere is not changed from prior using similar measuring technique. Vascular: No hyperdense vessel. Intracranial arterial calcification. Skull: No fracture or focal lesion. Sinuses/Orbits: No acute finding. Other: None. IMPRESSION: 1. No acute intracranial abnormality. 2. Unchanged 2.3 cm extra-axial mass abutting the left inferior cerebral hemisphere, likely a meningioma. Electronically Signed   By: Norman Gatlin  M.D.   On: 03/31/2023 20:42

## 2023-04-15 NOTE — Patient Instructions (Signed)
 You were seen and examined today by Dr. Rogers. Dr. Rogers is a hematologist, meaning that he specializes in blood abnormalities. Dr. Rogers discussed your past medical history, family history of cancers/blood conditions and the events that led to you being here today.  You were referred to Dr. Rogers due to iron  deficiency anemia (low iron  in the blood causing your hemoglobin to be low).  Dr. Katragadda has recommended additional labs today for further evaluation.  Follow-up as scheduled.

## 2023-04-16 ENCOUNTER — Other Ambulatory Visit: Payer: Self-pay | Admitting: Family Medicine

## 2023-04-16 LAB — KAPPA/LAMBDA LIGHT CHAINS
Kappa free light chain: 29.3 mg/L — ABNORMAL HIGH (ref 3.3–19.4)
Kappa, lambda light chain ratio: 1.1 (ref 0.26–1.65)
Lambda free light chains: 26.7 mg/L — ABNORMAL HIGH (ref 5.7–26.3)

## 2023-04-17 LAB — METHYLMALONIC ACID, SERUM: Methylmalonic Acid, Quantitative: 137 nmol/L (ref 0–378)

## 2023-04-17 LAB — COPPER, SERUM: Copper: 77 ug/dL (ref 69–132)

## 2023-04-19 LAB — IMMUNOFIXATION ELECTROPHORESIS
IgA: 142 mg/dL (ref 61–437)
IgG (Immunoglobin G), Serum: 801 mg/dL (ref 603–1613)
IgM (Immunoglobulin M), Srm: 29 mg/dL (ref 15–143)
Total Protein ELP: 5.8 g/dL — ABNORMAL LOW (ref 6.0–8.5)

## 2023-04-25 ENCOUNTER — Inpatient Hospital Stay: Payer: Medicare Other | Admitting: Oncology

## 2023-04-25 ENCOUNTER — Other Ambulatory Visit: Payer: Medicare Other

## 2023-04-26 LAB — PROTEIN ELECTROPHORESIS, SERUM
A/G Ratio: 2 — ABNORMAL HIGH (ref 0.7–1.7)
Albumin ELP: 3.9 g/dL (ref 2.9–4.4)
Alpha-1-Globulin: 0.2 g/dL (ref 0.0–0.4)
Alpha-2-Globulin: 0.4 g/dL (ref 0.4–1.0)
Beta Globulin: 0.7 g/dL (ref 0.7–1.3)
Gamma Globulin: 0.7 g/dL (ref 0.4–1.8)
Globulin, Total: 2 g/dL — ABNORMAL LOW (ref 2.2–3.9)
Total Protein ELP: 5.9 g/dL — ABNORMAL LOW (ref 6.0–8.5)

## 2023-04-29 ENCOUNTER — Other Ambulatory Visit: Payer: Self-pay | Admitting: Family Medicine

## 2023-04-29 DIAGNOSIS — I10 Essential (primary) hypertension: Secondary | ICD-10-CM

## 2023-05-08 ENCOUNTER — Inpatient Hospital Stay: Payer: Medicare Other | Attending: Oncology | Admitting: Oncology

## 2023-05-08 ENCOUNTER — Encounter: Payer: Self-pay | Admitting: Oncology

## 2023-05-08 DIAGNOSIS — I252 Old myocardial infarction: Secondary | ICD-10-CM | POA: Diagnosis not present

## 2023-05-08 DIAGNOSIS — Z85820 Personal history of malignant melanoma of skin: Secondary | ICD-10-CM | POA: Diagnosis not present

## 2023-05-08 DIAGNOSIS — D508 Other iron deficiency anemias: Secondary | ICD-10-CM | POA: Diagnosis not present

## 2023-05-08 DIAGNOSIS — Z801 Family history of malignant neoplasm of trachea, bronchus and lung: Secondary | ICD-10-CM | POA: Insufficient documentation

## 2023-05-08 DIAGNOSIS — D509 Iron deficiency anemia, unspecified: Secondary | ICD-10-CM | POA: Diagnosis not present

## 2023-05-08 NOTE — Progress Notes (Signed)
 [facility] [address] [place] [address]   Clinic Day:  05/08/2023  Referring physician: Zollie Lowers, MD  Patient Care Team: Zollie Lowers, MD as PCP - General (Family Medicine) Rosine Yancy ORN as Consulting Physician (Neurosurgery) Alvaro Ricardo KATHEE Raddle., MD as Consulting Physician (Urology) Billee Mliss BIRCH, [facility] (Pharmacist) Vicci Mcardle, OD (Optometry)   ASSESSMENT & PLAN:   Assessment:  1.  Iron  deficiency anemia: - He has been on iron  tablet twice daily for 6 months to a year.  Last transfusion in 1987. - CBC on 04/02/2023: Hb-11.7.  Last ferritin was 29 on 03/04/2023. - Denies any BRBPR/melena.  He is on Eliquis  for couple of years.  2.  Social/family history: - Lives at home with his wife.  Non-smoker. - Brother died of lung cancer.  Mother had lung cancer.  Plan:  1.  Iron  deficiency anemia: - Lab work from 04/15/2023 shows hemoglobin 11.7, with normal B12, folate and copper  levels.  Iron  saturations are 28%, ferritin 25.  He is currently on oral iron  supplements. -Denies any melena, hematochezia or bright red blood per rectum. -No evidence of inflammatory process or hemolysis.  Kappa and lambda free light chains are slightly elevated within normal kappa lambda light chain ratio.  Protein electrophoresis appears unremarkable without evidence of monoclonal protein. -Discussed continuing oral iron  supplements and potentially even increasing to twice daily versus IV iron .  -Patient presented in increasing his oral iron  to twice daily and returning to clinic in approximately 3 months for follow-up.  At that time, if his iron  levels have not improved, he would be agreeable to IV iron .   Orders Placed This Encounter  Procedures   Iron  and TIBC ([facility] ONLY)    Standing Status:   Future    Expected Date:   08/05/2023    Expiration Date:   05/07/2024   Ferritin    Standing Status:   Future    Expected Date:   08/05/2023     Expiration Date:   05/07/2024   CBC with Differential/Platelet    Standing Status:   Future    Expected Date:   08/05/2023    Expiration Date:   05/07/2024   Comprehensive metabolic panel    Standing Status:   Future    Expected Date:   08/05/2023    Expiration Date:   05/07/2024    Delon FORBES Hope, NP   2/5/202511:06 AM  CHIEF COMPLAINT/PURPOSE OF CONSULT:   Diagnosis: Iron  deficiency anemia  Current Therapy: Oral iron    HISTORY OF PRESENT ILLNESS:   Theodore Mendoza is a 83 y.o. male presenting to clinic today for follow-up for iron  deficiency anemia.  He was last seen by Dr. Rogers on 04/15/2023 for initial workup and labs.  He presents back today to review most recent lab work.  Since his visit, he denies any hospitalizations, surgeries or changes to his baseline health.  He denies any bleeding, melena, hematochezia or bright red blood per rectum.  He is here today with his wife.  He admits that he bruises easily and when he hits himself on something he does bleed quite easily.  Has shortness of breath with exertion.  He uses a wheelchair.   Reports he takes iron  supplements daily and denies any constipation or GI upset.  Reports his appetite is 75% energy levels are 50%.  Denies any pain.   PAST MEDICAL HISTORY:   Past Medical History: Past Medical History:  Diagnosis Date   AAA (  abdominal aortic aneurysm) (HCC)    Atrial fibrillation (HCC)    Brain tumor (HCC)    x2 (benign per pt)   Cancer (HCC)    melanoma removed twice (head and right forearm)   Cardiac arrhythmia    CHF (congestive heart failure) (HCC)    GERD (gastroesophageal reflux disease)    Guillain-Barre syndrome (HCC) 02/13/1986   Headache    Hypertension    Kidney stones    Myocardial infarction [facility]) 2007    Surgical History: Past Surgical History:  Procedure Laterality Date   ABDOMINAL AORTIC ENDOVASCULAR STENT GRAFT Bilateral 04/04/2022   Procedure: ABDOMINAL AORTIC ENDOVASCULAR STENT GRAFT;  Surgeon: Serene Gaile ORN, MD;  Location: [facility] OR;  Service: Vascular;  Laterality: Bilateral;   COLONOSCOPY WITH PROPOFOL  N/A 08/14/2020   Procedure: COLONOSCOPY WITH PROPOFOL ;  Surgeon: Golda Claudis PENNER, MD;  Location: [facility];  Service: Endoscopy;  Laterality: N/A;   CORONARY ANGIOPLASTY  1997   after mi   CYSTOSCOPY WITH RETROGRADE PYELOGRAM, URETEROSCOPY AND STENT PLACEMENT Left 06/28/2014   Procedure: 1ST STAGE CYSTOSCOPY/URETEROSCOPY/STENT PLACEMENT;  Surgeon: Ricardo Likens, MD;  Location: [facility] ORS;  Service: Urology;  Laterality: Left;   CYSTOSCOPY WITH STENT PLACEMENT Left 05/08/2014   Procedure: CYSTOSCOPY, RETROGRADE PYELOGRAM WITH LEFT URETERAL STENT PLACEMENT;  Surgeon: Ricardo Likens, MD;  Location: [facility];  Service: Urology;  Laterality: Left;   ENTEROSCOPY N/A 09/16/2020   Procedure: PUSH ENTEROSCOPY;  Surgeon: Eartha Angelia Sieving, MD;  [facility];  Service: Gastroenterology;  Laterality: N/A;   ENTEROSCOPY N/A 10/25/2020   Procedure: PUSH ENTEROSCOPY;  Surgeon: Eartha Angelia Sieving, MD;  [facility];  Service: Gastroenterology;  Laterality: N/A;   ESOPHAGOGASTRODUODENOSCOPY (EGD) WITH PROPOFOL  N/A 12/16/2019   Procedure: ESOPHAGOGASTRODUODENOSCOPY (EGD) WITH PROPOFOL ;  Surgeon: Shila Gustav GAILS, MD;  [facility]: [facility];  Service: Endoscopy;  Laterality: N/A;   ESOPHAGOGASTRODUODENOSCOPY (EGD) WITH PROPOFOL  N/A 08/13/2020   Procedure: ESOPHAGOGASTRODUODENOSCOPY (EGD) WITH PROPOFOL ;  Surgeon: Golda Claudis PENNER, MD;  [facility];  Service: Endoscopy;  Laterality: N/A;   ESOPHAGOGASTRODUODENOSCOPY (EGD) WITH PROPOFOL  N/A 09/16/2020   Procedure: ESOPHAGOGASTRODUODENOSCOPY (EGD) WITH PROPOFOL ;  Surgeon: Eartha Angelia Sieving, MD;  [facility];  Service: Gastroenterology;  Laterality: N/A;  1:55   ESOPHAGOGASTRODUODENOSCOPY (EGD) WITH PROPOFOL  N/A 10/25/2020   Procedure: ESOPHAGOGASTRODUODENOSCOPY (EGD) WITH PROPOFOL ;  Surgeon: Eartha Angelia Sieving, MD;  [facility];  Service: Gastroenterology;  Laterality: N/A;  2:00   EYE SURGERY Right may 2015   growth removed, july 2015left eye cataract removed, right eye catarct removed   gamma kniferadiation treatment  Feb 09 2014   [facility]   GIVENS CAPSULE STUDY N/A 10/11/2020   Procedure: GIVENS CAPSULE STUDY;  Surgeon: Eartha Angelia Sieving, MD;  [facility];  Service: Gastroenterology;  Laterality: N/A;  7:30   HEMOSTASIS CLIP PLACEMENT  12/16/2019   Procedure: HEMOSTASIS CLIP PLACEMENT;  Surgeon: Shila Gustav GAILS, MD;  [facility];  Service: Endoscopy;;   HEMOSTASIS CONTROL  12/16/2019   Procedure: HEMOSTASIS CONTROL;  Surgeon: Shila Gustav GAILS, MD;  [facility];  Service: Endoscopy;;  Endoloop   HOLMIUM LASER APPLICATION Left 06/28/2014   Procedure: HOLMIUM LASER APPLICATION;  Surgeon: Ricardo Likens, MD;  [facility]: [facility];  Service: Urology;  Laterality: Left;   LITHOTRIPSY  years ago   POLYPECTOMY  12/16/2019   Procedure: POLYPECTOMY;  Surgeon: Shila Gustav GAILS, MD;  [facility];  Service: Endoscopy;;   STONE  EXTRACTION WITH BASKET Left 06/28/2014   Procedure: STONE EXTRACTION WITH BASKET;  Surgeon: Ricardo Likens, MD;  Location: [facility];  Service: Urology;  Laterality: Left;    Social History: Social History   Socioeconomic History   Marital status: Married    Spouse name: Orlean   Number of children: 1   Years of education: Not on file   Highest education level: Not on file  Occupational History   Occupation: retired   Tobacco Use   Smoking status: Never    Passive exposure: Never   Smokeless tobacco: Never  Vaping Use   Vaping status: Never Used  Substance and Sexual Activity   Alcohol use: No   Drug use: No   Sexual activity: Not Currently  Other Topics Concern   Not on file  Social History Narrative   Lives with wife - they have a basement but don't use it   One  daughter - husband is a programmer, multimedia and they go to his church   Patient is paraplegic - uses wheelchair, sometimes motorized   He does still drive using hand controls.   Social Drivers of Corporate Investment Banker Strain: Low Risk  (06/08/2022)   Overall Financial Resource Strain (CARDIA)    Difficulty of Paying Living Expenses: Not hard at all  Food Insecurity: No Food Insecurity (08/01/2022)   Hunger Vital Sign    Worried About Running Out of Food in the Last Year: Never true    Ran Out of Food in the Last Year: Never true  Transportation Needs: No Transportation Needs (08/06/2022)   PRAPARE - Administrator, Civil Service (Medical): No    Lack of Transportation (Non-Medical): No  Physical Activity: Insufficiently Active (06/08/2022)   Exercise Vital Sign    Days of Exercise per Week: 3 days    Minutes of Exercise per Session: 30 min  Stress: No Stress Concern Present (06/08/2022)   [employer] of Occupational Health - Occupational Stress Questionnaire    Feeling of Stress : Not at all  Social Connections: Moderately Integrated (06/08/2022)   Social Connection and Isolation Panel [NHANES]    Frequency of Communication with Friends and Family: More than three times a week    Frequency of Social Gatherings with Friends and Family: More than three times a week    Attends Religious Services: More than 4 times per year    Active Member of [employer] or Organizations: No    Attends Banker Meetings: Never    Marital Status: Married  Catering Manager Violence: Not At Risk (08/01/2022)   Humiliation, Afraid, Rape, and Kick questionnaire    Fear of Current or Ex-Partner: No    Emotionally Abused: No    Physically Abused: No    Sexually Abused: No    Family History: Family History  Problem Relation Age of Onset   Diabetes Mother    Lung cancer Mother    CAD Father    Diabetes Brother    Diabetes Sister    Stroke Sister    Other Maternal Grandfather        brain  tumor   Colon cancer Neg Hx    Pancreatic cancer Neg Hx    Esophageal cancer Neg Hx     Current Medications:  Current Outpatient Medications:    amLODipine  (NORVASC ) 5 MG tablet, TAKE ONE TABLET DAILY, Disp: 90 tablet, Rfl: 0   apixaban  (ELIQUIS ) 5 MG TABS tablet, Take 1 tablet (5 mg total) by mouth  2 (two) times daily., Disp: 180 tablet, Rfl: 3   diphenoxylate -atropine  (LOMOTIL ) 2.5-0.025 MG tablet, Take 1 tablet by mouth 4 (four) times daily as needed for diarrhea or loose stools., Disp: 40 tablet, Rfl: 1   dofetilide  (TIKOSYN ) 500 MCG capsule, Take 1 capsule (500 mcg total) by mouth 2 (two) times daily., Disp: 180 capsule, Rfl: 3   escitalopram  (LEXAPRO ) 20 MG tablet, Take 1 tablet (20 mg total) by mouth daily., Disp: 90 tablet, Rfl: 3   FEROSUL 325 (65 Fe) MG tablet, TAKE 1 TABLET DAILY, Disp: 90 tablet, Rfl: 0   finasteride  (PROSCAR ) 5 MG tablet, Take 1 tablet (5 mg total) by mouth daily. For urine flow, Disp: 90 tablet, Rfl: 3   furosemide  (LASIX ) 20 MG tablet, Take 1 tablet (20 mg total) by mouth daily., Disp: 90 tablet, Rfl: 3   gabapentin  (NEURONTIN ) 300 MG capsule, TAKE 1 CAPSULE BY MOUTH 3 TIMES A DAY, Disp: 360 capsule, Rfl: 3   lisinopril  (ZESTRIL ) 5 MG tablet, Take 1 tablet (5 mg total) by mouth daily., Disp: 90 tablet, Rfl: 3   mirtazapine  (REMERON ) 7.5 MG tablet, Take 1 tablet (7.5 mg total) by mouth at bedtime., Disp: 90 tablet, Rfl: 3   ondansetron  (ZOFRAN -ODT) 4 MG disintegrating tablet, Take 1 tablet (4 mg total) by mouth every 8 (eight) hours as needed for nausea or vomiting., Disp: 12 tablet, Rfl: 0   pantoprazole  (PROTONIX ) 40 MG tablet, Take 1 tablet (40 mg total) by mouth 2 (two) times daily., Disp: 180 tablet, Rfl: 3   pravastatin  (PRAVACHOL ) 40 MG tablet, Take 1 tablet (40 mg total) by mouth daily., Disp: 90 tablet, Rfl: 3   promethazine  (PHENERGAN ) 25 MG tablet, Take 0.5 tablets (12.5 mg total) by mouth every 6 (six) hours as needed for nausea or vomiting., Disp: 12  tablet, Rfl: 0   sucralfate  (CARAFATE ) 1 g tablet, Take 1 tablet (1 g total) by mouth 2 (two) times daily., Disp: 180 tablet, Rfl: 3   tamsulosin  (FLOMAX ) 0.4 MG CAPS capsule, Take 2 capsules (0.8 mg total) by mouth at bedtime. For urine flow and prostate, Disp: 180 capsule, Rfl: 3   vitamin B-12 (CYANOCOBALAMIN ) 1000 MCG tablet, Take 1 tablet (1,000 mcg total) by mouth daily., Disp: 30 tablet, Rfl: 2   Allergies: Allergies  Allergen Reactions   Valsartan Rash   Morphine  And Codeine Other (See Comments)    Reaction:  Confusion/weakness/hallucinations    REVIEW OF SYSTEMS:   Review of Systems  Respiratory:  Positive for shortness of breath.   Hematological:  Bruises/bleeds easily.     VITALS:   Blood pressure 138/63, pulse 65, temperature (!) 96.2 F (35.7 C), temperature source Tympanic, resp. rate 17, SpO2 99%.  Wt Readings from Last 3 Encounters:  04/02/23 150 lb (68 kg)  03/31/23 149 lb 14.6 oz (68 kg)  03/04/23 150 lb (68 kg)    There is no height or weight on file to calculate BMI.   PHYSICAL EXAM:   Physical Exam Constitutional:      Appearance: Normal appearance.  Cardiovascular:     Rate and Rhythm: Normal rate and regular rhythm.  Pulmonary:     Effort: Pulmonary effort is normal.     Breath sounds: Normal breath sounds.  Abdominal:     General: Bowel sounds are normal.     Palpations: Abdomen is soft.  Musculoskeletal:        General: No swelling. Normal range of motion.  Neurological:     Mental Status:  He is alert and oriented to person, place, and time. Mental status is at baseline.     LABS:   CBC    Component Value Date/Time   WBC 7.5 04/02/2023 1801   RBC 3.78 (L) 04/15/2023 1407   RBC 3.95 (L) 04/02/2023 1801   HGB 11.7 (L) 04/02/2023 1801   HGB 10.8 (L) 03/19/2023 1218   HCT 36.9 (L) 04/02/2023 1801   HCT 34.4 (L) 03/19/2023 1218   PLT 152 04/02/2023 1801   PLT 128 (L) 03/19/2023 1218   MCV 93.4 04/02/2023 1801   MCV 93 03/19/2023  1218   MCH 29.6 04/02/2023 1801   MCHC 31.7 04/02/2023 1801   RDW 14.5 04/02/2023 1801   RDW 13.0 03/19/2023 1218   LYMPHSABS 1.0 03/19/2023 1218   MONOABS 0.3 12/17/2022 1018   EOSABS 0.2 03/19/2023 1218   BASOSABS 0.1 03/19/2023 1218    CMP    Component Value Date/Time   NA 140 04/02/2023 1801   NA 149 (H) 03/19/2023 1218   K 3.8 04/02/2023 1801   CL 103 04/02/2023 1801   CO2 29 04/02/2023 1801   GLUCOSE 114 (H) 04/02/2023 1801   BUN 13 04/02/2023 1801   BUN 11 03/19/2023 1218   CREATININE 0.74 04/02/2023 1801   CALCIUM 9.2 04/02/2023 1801   PROT 6.5 04/02/2023 1801   PROT 5.7 (L) 03/19/2023 1218   ALBUMIN 3.9 04/02/2023 1801   ALBUMIN 3.9 03/19/2023 1218   AST 18 04/02/2023 1801   ALT 11 04/02/2023 1801   ALKPHOS 49 04/02/2023 1801   BILITOT 1.1 04/02/2023 1801   BILITOT 0.5 03/19/2023 1218   GFRNONAA >60 04/02/2023 1801   GFRAA 97 05/06/2020 1057    No results found for: CEA1, CEA / No results found for: CEA1, CEA Lab Results  Component Value Date   PSA1 3.8 02/25/2023   No results found for: CAN199 No results found for: [id number]  Lab Results  Component Value Date   TOTALPROTELP 5.9 (L) 04/15/2023   TOTALPROTELP 5.8 (L) 04/15/2023   ALBUMINELP 3.9 04/15/2023   A1GS 0.2 04/15/2023   A2GS 0.4 04/15/2023   BETS 0.7 04/15/2023   GAMS 0.7 04/15/2023   MSPIKE Not Observed 04/15/2023   SPEI Comment 04/15/2023   Lab Results  Component Value Date   TIBC 350 04/15/2023   TIBC 312 03/04/2023   TIBC 307 06/11/2022   FERRITIN 25 04/15/2023   FERRITIN 29 03/04/2023   FERRITIN 27 06/11/2022   IRONPCTSAT 28 04/15/2023   IRONPCTSAT 18 (L) 03/04/2023   IRONPCTSAT 36 06/11/2022   Lab Results  Component Value Date   LDH 116 04/15/2023     STUDIES:   No results found.

## 2023-05-13 DIAGNOSIS — H52223 Regular astigmatism, bilateral: Secondary | ICD-10-CM | POA: Diagnosis not present

## 2023-05-20 ENCOUNTER — Ambulatory Visit: Payer: Medicare Other | Admitting: Surgery

## 2023-05-20 ENCOUNTER — Other Ambulatory Visit (HOSPITAL_COMMUNITY): Payer: Medicare Other

## 2023-05-27 ENCOUNTER — Ambulatory Visit: Payer: Medicare Other | Admitting: Physician Assistant

## 2023-05-27 ENCOUNTER — Ambulatory Visit (HOSPITAL_COMMUNITY)
Admission: RE | Admit: 2023-05-27 | Discharge: 2023-05-27 | Disposition: A | Discharge status: Home / Self Care | Payer: Medicare Other | Source: Ambulatory Visit | Attending: Surgery | Admitting: Surgery

## 2023-05-27 VITALS — BP 145/55 | HR 73 | Temp 97.8°F | Ht 68.0 in | Wt 150.0 lb

## 2023-05-27 DIAGNOSIS — I7143 Infrarenal abdominal aortic aneurysm, without rupture: Secondary | ICD-10-CM | POA: Diagnosis not present

## 2023-05-27 NOTE — Progress Notes (Signed)
 Office Note     CC:  follow up Requesting Provider:  Mechele Claude, MD  HPI: Theodore Mendoza is a 83 y.o. ([birth year]) male who presents for surveillance of EVAR.  This was performed by Dr. Myra Gianotti in January 2024 due to 5.3 cm infrarenal abdominal aortic aneurysm.  He was discharged home on postoperative day #1 after an uncomplicated surgery and hospital stay.  Postoperatively he was noted to have a type II endoleak by CT.  He denies any new or changing abdominal or back pain.  Past medical history also significant for CAD with coronary stenting in 2008 for NSTEMI.  He is anticoagulated for atrial fibrillation.  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   Past Medical History:  Diagnosis Date   AAA (abdominal aortic aneurysm) (HCC)    Atrial fibrillation (HCC)    Brain tumor (HCC)    x2 (benign per pt)   Cancer (HCC)    melanoma removed twice (head and right forearm)   Cardiac arrhythmia    CHF (congestive heart failure) (HCC)    GERD (gastroesophageal reflux disease)    Guillain-Barre syndrome (HCC) 02/13/1986   Headache    Hypertension    Kidney stones    Myocardial infarction [facility]) 2007    Past Surgical History:  Procedure Laterality Date   ABDOMINAL AORTIC ENDOVASCULAR STENT GRAFT Bilateral 04/04/2022   Procedure: ABDOMINAL AORTIC ENDOVASCULAR STENT GRAFT;  Surgeon: Nada Libman, MD;  Location: [facility] OR;  Service: Vascular;  Laterality: Bilateral;   COLONOSCOPY WITH PROPOFOL N/A 08/14/2020   Procedure: COLONOSCOPY WITH PROPOFOL;  Surgeon: Malissa Hippo, MD;  Location: [facility];  Service: Endoscopy;  Laterality: N/A;   CORONARY ANGIOPLASTY  1997   after mi   CYSTOSCOPY WITH RETROGRADE PYELOGRAM, URETEROSCOPY AND STENT PLACEMENT Left 06/28/2014   Procedure: 1ST STAGE CYSTOSCOPY/URETEROSCOPY/STENT PLACEMENT;  Surgeon: Sebastian Ache, MD;  Location: [facility];  Service: Urology;  Laterality: Left;   CYSTOSCOPY WITH STENT PLACEMENT Left  05/08/2014   Procedure: CYSTOSCOPY, RETROGRADE PYELOGRAM WITH LEFT URETERAL STENT PLACEMENT;  Surgeon: Sebastian Ache, MD;  Location: [facility];  Service: Urology;  Laterality: Left;   ENTEROSCOPY N/A 09/16/2020   Procedure: PUSH ENTEROSCOPY;  Surgeon: Dolores Frame, MD;  [facility]: [facility];  Service: Gastroenterology;  Laterality: N/A;   ENTEROSCOPY N/A 10/25/2020   Procedure: PUSH ENTEROSCOPY;  Surgeon: Dolores Frame, MD;  [facility];  Service: Gastroenterology;  Laterality: N/A;   ESOPHAGOGASTRODUODENOSCOPY (EGD) WITH PROPOFOL N/A 12/16/2019   Procedure: ESOPHAGOGASTRODUODENOSCOPY (EGD) WITH PROPOFOL;  Surgeon: Napoleon Form, MD;  [facility]: [facility];  Service: Endoscopy;  Laterality: N/A;   ESOPHAGOGASTRODUODENOSCOPY (EGD) WITH PROPOFOL N/A 08/13/2020   Procedure: ESOPHAGOGASTRODUODENOSCOPY (EGD) WITH PROPOFOL;  Surgeon: Malissa Hippo, MD;  [facility];  Service: Endoscopy;  Laterality: N/A;   ESOPHAGOGASTRODUODENOSCOPY (EGD) WITH PROPOFOL N/A 09/16/2020   Procedure: ESOPHAGOGASTRODUODENOSCOPY (EGD) WITH PROPOFOL;  Surgeon: Dolores Frame, MD;  [facility];  Service: Gastroenterology;  Laterality: N/A;  1:55   ESOPHAGOGASTRODUODENOSCOPY (EGD) WITH PROPOFOL N/A 10/25/2020   Procedure: ESOPHAGOGASTRODUODENOSCOPY (EGD) WITH PROPOFOL;  Surgeon: Dolores Frame, MD;  [facility];  Service: Gastroenterology;  Laterality: N/A;  2:00   EYE SURGERY Right may 2015   growth removed, july 2015left eye cataract removed, right eye catarct removed   gamma kniferadiation treatment  Feb 09 2014   [facility] for brain tumor   GIVENS CAPSULE STUDY N/A 10/11/2020  Procedure: GIVENS CAPSULE STUDY;  Surgeon: Dolores Frame, MD;  [facility];  Service: Gastroenterology;  Laterality: N/A;  7:30   HEMOSTASIS CLIP PLACEMENT  12/16/2019   Procedure: HEMOSTASIS CLIP PLACEMENT;  Surgeon: Napoleon Form, MD;  [facility]: [facility];  Service: Endoscopy;;   HEMOSTASIS CONTROL  12/16/2019   Procedure: HEMOSTASIS CONTROL;  Surgeon: Napoleon Form, MD;  [facility]: [facility];  Service: Endoscopy;;  Endoloop   HOLMIUM LASER APPLICATION Left 06/28/2014   Procedure: HOLMIUM LASER APPLICATION;  Surgeon: Sebastian Ache, MD;  Location: [facility];  Service: Urology;  Laterality: Left;   LITHOTRIPSY  years ago   POLYPECTOMY  12/16/2019   Procedure: POLYPECTOMY;  Surgeon: Napoleon Form, MD;  [facility];  Service: Endoscopy;;   STONE EXTRACTION WITH BASKET Left 06/28/2014   Procedure: STONE EXTRACTION WITH BASKET;  Surgeon: Sebastian Ache, MD;  Location: [facility];  Service: Urology;  Laterality: Left;    Social History   Socioeconomic History   Marital status: Married    Spouse name: Talbert Forest   Number of children: 1   Years of education: Not on file   Highest education level: Not on file  Occupational History   Occupation: retired   Tobacco Use   Smoking status: Never    Passive exposure: Never   Smokeless tobacco: Never  Vaping Use   Vaping status: Never Used  Substance and Sexual Activity   Alcohol use: No   Drug use: No   Sexual activity: Not Currently  Other Topics Concern   Not on file  Social History Narrative   Lives with wife - they have a basement but don't use it   One daughter - husband is a Programmer, multimedia and they go to his church   Patient is paraplegic - uses wheelchair, sometimes motorized   He does still drive using hand controls.   Social Drivers of Corporate investment banker Strain: Low Risk  (06/08/2022)   Overall Financial Resource Strain (CARDIA)    Difficulty of Paying Living Expenses: Not hard at all  Food Insecurity: No Food Insecurity (08/01/2022)   Hunger Vital Sign    Worried About Running Out of Food in the Last Year: Never true    Ran Out of Food in the Last Year: Never true  Transportation Needs: No Transportation Needs (08/06/2022)    PRAPARE - Administrator, Civil Service (Medical): No    Lack of Transportation (Non-Medical): No  Physical Activity: Insufficiently Active (06/08/2022)   Exercise Vital Sign    Days of Exercise per Week: 3 days    Minutes of Exercise per Session: 30 min  Stress: No Stress Concern Present (06/08/2022)   [employer] of Occupational Health - Occupational Stress Questionnaire    Feeling of Stress : Not at all  Social Connections: Moderately Integrated (06/08/2022)   Social Connection and Isolation Panel [NHANES]    Frequency of Communication with Friends and Family: More than three times a week    Frequency of Social Gatherings with Friends and Family: More than three times a week    Attends Religious Services: More than 4 times per year    Active Member of [employer] or Organizations: No    Attends Banker Meetings: Never    Marital Status: Married  Catering manager Violence: Not At Risk (08/01/2022)   Humiliation, Afraid, Rape, and Kick questionnaire    Fear of Current or Ex-Partner: No    Emotionally Abused: No  Physically Abused: No    Sexually Abused: No    Family History  Problem Relation Age of Onset   Diabetes Mother    Lung cancer Mother    CAD Father    Diabetes Brother    Diabetes Sister    Stroke Sister    Other Maternal Grandfather        brain tumor   Colon cancer Neg Hx    Pancreatic cancer Neg Hx    Esophageal cancer Neg Hx     Current Outpatient Medications  Medication Sig Dispense Refill   amLODipine (NORVASC) 5 MG tablet TAKE ONE TABLET DAILY 90 tablet 0   apixaban (ELIQUIS) 5 MG TABS tablet Take 1 tablet (5 mg total) by mouth 2 (two) times daily. 180 tablet 3   diphenoxylate-atropine (LOMOTIL) 2.5-0.025 MG tablet Take 1 tablet by mouth 4 (four) times daily as needed for diarrhea or loose stools. 40 tablet 1   dofetilide (TIKOSYN) 500 MCG capsule Take 1 capsule (500 mcg total) by mouth 2 (two) times daily. 180 capsule 3   escitalopram  (LEXAPRO) 20 MG tablet Take 1 tablet (20 mg total) by mouth daily. 90 tablet 3   FEROSUL 325 (65 Fe) MG tablet TAKE 1 TABLET DAILY 90 tablet 0   finasteride (PROSCAR) 5 MG tablet Take 1 tablet (5 mg total) by mouth daily. For urine flow 90 tablet 3   furosemide (LASIX) 20 MG tablet Take 1 tablet (20 mg total) by mouth daily. 90 tablet 3   gabapentin (NEURONTIN) 300 MG capsule TAKE 1 CAPSULE BY MOUTH 3 TIMES A DAY 360 capsule 3   lisinopril (ZESTRIL) 5 MG tablet Take 1 tablet (5 mg total) by mouth daily. 90 tablet 3   mirtazapine (REMERON) 7.5 MG tablet Take 1 tablet (7.5 mg total) by mouth at bedtime. 90 tablet 3   ondansetron (ZOFRAN-ODT) 4 MG disintegrating tablet Take 1 tablet (4 mg total) by mouth every 8 (eight) hours as needed for nausea or vomiting. 12 tablet 0   pantoprazole (PROTONIX) 40 MG tablet Take 1 tablet (40 mg total) by mouth 2 (two) times daily. 180 tablet 3   pravastatin (PRAVACHOL) 40 MG tablet Take 1 tablet (40 mg total) by mouth daily. 90 tablet 3   promethazine (PHENERGAN) 25 MG tablet Take 0.5 tablets (12.5 mg total) by mouth every 6 (six) hours as needed for nausea or vomiting. 12 tablet 0   sucralfate (CARAFATE) 1 g tablet Take 1 tablet (1 g total) by mouth 2 (two) times daily. 180 tablet 3   tamsulosin (FLOMAX) 0.4 MG CAPS capsule Take 2 capsules (0.8 mg total) by mouth at bedtime. For urine flow and prostate 180 capsule 3   vitamin B-12 (CYANOCOBALAMIN) 1000 MCG tablet Take 1 tablet (1,000 mcg total) by mouth daily. 30 tablet 2   No current facility-administered medications for this visit.    Allergies  Allergen Reactions   Valsartan Rash   Morphine And Codeine Other (See Comments)    Reaction:  Confusion/weakness/hallucinations     REVIEW OF SYSTEMS:   [X]  denotes positive finding, [ ]  denotes negative finding Cardiac  Comments:  Chest pain or chest pressure:    Shortness of breath upon exertion:    Short of breath when lying flat:    Irregular heart  rhythm:        Vascular    Pain in calf, thigh, or hip brought on by ambulation:    Pain in feet at night that wakes you up  from your sleep:     Blood clot in your veins:    Leg swelling:         Pulmonary    Oxygen at home:    Productive cough:     Wheezing:         Neurologic    Sudden weakness in arms or legs:     Sudden numbness in arms or legs:     Sudden onset of difficulty speaking or slurred speech:    Temporary loss of vision in one eye:     Problems with dizziness:         Gastrointestinal    Blood in stool:     Vomited blood:         Genitourinary    Burning when urinating:     Blood in urine:        Psychiatric    Major depression:         Hematologic    Bleeding problems:    Problems with blood clotting too easily:        Skin    Rashes or ulcers:        Constitutional    Fever or chills:      PHYSICAL EXAMINATION:  Vitals:   05/27/23 0920  BP: (!) 145/55  Pulse: 73  Temp: 97.8 F (36.6 C)  TempSrc: Temporal  SpO2: 96%  Weight: 150 lb (68 kg)  Height: 5\' 8"  (1.727 m)    General:  WDWN in NAD; vital signs documented above Gait: Not observed HENT: WNL, normocephalic Pulmonary: normal non-labored breathing , without Rales, rhonchi,  wheezing Cardiac: regular HR Abdomen: soft, NT, no masses Skin: without rashes Extremities: without ischemic changes, without Gangrene , without cellulitis; without open wounds; feet are warm and well perfused Musculoskeletal: atrophy of BLE Neurologic: A&O X 3 Psychiatric:  The pt has Normal affect.   Non-Invasive Vascular Imaging:   EVAR demonstrates 5.3 cm AAA sac with persistent type II endoleak  CT from December 2024 confirms persistent type II endoleak    ASSESSMENT/PLAN:: 83 y.o. male here for follow up for surveillance of AAA sac after EVAR  Mr. Lipsett is doing well overall.  EVAR duplex demonstrates a 5.3 cm AAA sac with persistent type II endoleak.  This was also noted on CTA of the abdomen  and pelvis in December 2024.  Infrarenal AAA was repaired at 5.3 cm in January 2024 by Dr. Myra Gianotti.  No significant change in diameter since that time despite endoleak.  Case was discussed with Dr. Myra Gianotti who recommended continued conservative management with a repeat ultrasound in 6 months.  He will call/return to office with any problems or concerns.   Emilie Rutter, PA-C [facility] [phone]  Clinic MD:   Myra Gianotti

## 2023-05-30 ENCOUNTER — Other Ambulatory Visit: Payer: Self-pay | Admitting: *Deleted

## 2023-05-30 DIAGNOSIS — I7143 Infrarenal abdominal aortic aneurysm, without rupture: Secondary | ICD-10-CM

## 2023-05-30 NOTE — Progress Notes (Signed)
 [id number]

## 2023-06-03 ENCOUNTER — Ambulatory Visit: Payer: Medicare Other | Admitting: Family Medicine

## 2023-06-04 ENCOUNTER — Encounter: Payer: Self-pay | Admitting: Family Medicine

## 2023-06-04 ENCOUNTER — Ambulatory Visit (INDEPENDENT_AMBULATORY_CARE_PROVIDER_SITE_OTHER): Payer: Medicare Other | Admitting: Family Medicine

## 2023-06-04 VITALS — BP 122/51 | HR 55 | Temp 97.4°F | Ht 68.0 in

## 2023-06-04 DIAGNOSIS — I10 Essential (primary) hypertension: Secondary | ICD-10-CM | POA: Diagnosis not present

## 2023-06-04 DIAGNOSIS — G8222 Paraplegia, incomplete: Secondary | ICD-10-CM

## 2023-06-04 DIAGNOSIS — I4819 Other persistent atrial fibrillation: Secondary | ICD-10-CM

## 2023-06-04 MED ORDER — DOFETILIDE 500 MCG PO CAPS: 500.0000 ug | ORAL_CAPSULE | Freq: Two times a day (BID) | ORAL | 3 refills | Status: DC

## 2023-06-04 MED ORDER — AMLODIPINE BESYLATE 5 MG PO TABS: 5.0000 mg | ORAL_TABLET | Freq: Every day | ORAL | 0 refills | Status: DC

## 2023-06-04 NOTE — Progress Notes (Unsigned)
 Subjective:  Patient ID: Theodore Mendoza, male    DOB: 06-30-1940  Age: 83 y.o. MRN: [id number]  CC: Medical Management of Chronic Issues   HPI Moe Graca Timko presents for  follow-up of hypertension. Patient has no history of headache chest pain or shortness of breath or recent cough. Patient also denies symptoms of TIA such as focal numbness or weakness. Patient denies side effects from medication. States taking it regularly.  Paraplegic wheelchair-bound due to Guillain-Barr syndrome in 1987.  Atrial fibrillation follow up. Pt. is treated with rate control and anticoagulation. Pt.  denies palpitations, rapid rate, chest pain, dyspnea and edema. There has been no bleeding from nose or gums. Pt. has not noticed blood with urine or stool.  Although there is routine bruising easily, it is not excessive.   in for follow-up of elevated cholesterol. Doing well without complaints on current medication. Denies side effects of statin including myalgia and arthralgia and nausea. Currently no chest pain, shortness of breath or other cardiovascular related symptoms noted.  Patient in for follow-up of GERD. Currently asymptomatic taking  PPI daily. There is no chest pain or heartburn. No hematemesis and no melena. No dysphagia or choking. Onset is remote. Progression is stable. Complicating factors, none.   History Zaydyn has a past medical history of AAA (abdominal aortic aneurysm) (HCC), Atrial fibrillation (HCC), Brain tumor (HCC), Cancer (HCC), Cardiac arrhythmia, CHF (congestive heart failure) (HCC), GERD (gastroesophageal reflux disease), Guillain-Barre syndrome (HCC) (02/13/1986), Headache, Hypertension, Kidney stones, and Myocardial infarction (HCC) (2007).   He has a past surgical history that includes Cystoscopy with stent placement (Left, 05/08/2014); Coronary angioplasty (1997); Eye surgery (Right, may 2015); gamma kniferadiation treatment (Feb 09 2014); Lithotripsy (years ago); Cystoscopy with  retrograde pyelogram, ureteroscopy and stent placement (Left, 06/28/2014); Holmium laser application (Left, 06/28/2014); Stone extraction with basket (Left, 06/28/2014); Esophagogastroduodenoscopy (egd) with propofol (N/A, 12/16/2019); Hemostasis clip placement (12/16/2019); Hemostasis control (12/16/2019); polypectomy (12/16/2019); Esophagogastroduodenoscopy (egd) with propofol (N/A, 08/13/2020); Colonoscopy with propofol (N/A, 08/14/2020); Esophagogastroduodenoscopy (egd) with propofol (N/A, 09/16/2020); enteroscopy (N/A, 09/16/2020); Givens capsule study (N/A, 10/11/2020); Esophagogastroduodenoscopy (egd) with propofol (N/A, 10/25/2020); enteroscopy (N/A, 10/25/2020); and Abdominal aortic endovascular stent graft (Bilateral, 04/04/2022).   His family history includes CAD in his father; Diabetes in his brother, mother, and sister; Lung cancer in his mother; Other in his maternal grandfather; Stroke in his sister.He reports that he has never smoked. He has never been exposed to tobacco smoke. He has never used smokeless tobacco. He reports that he does not drink alcohol and does not use drugs.  Current Outpatient Medications on File Prior to Visit  Medication Sig Dispense Refill   apixaban (ELIQUIS) 5 MG TABS tablet Take 1 tablet (5 mg total) by mouth 2 (two) times daily. 180 tablet 3   diphenoxylate-atropine (LOMOTIL) 2.5-0.025 MG tablet Take 1 tablet by mouth 4 (four) times daily as needed for diarrhea or loose stools. 40 tablet 1   escitalopram (LEXAPRO) 20 MG tablet Take 1 tablet (20 mg total) by mouth daily. 90 tablet 3   FEROSUL 325 (65 Fe) MG tablet TAKE 1 TABLET DAILY 90 tablet 0   finasteride (PROSCAR) 5 MG tablet Take 1 tablet (5 mg total) by mouth daily. For urine flow 90 tablet 3   furosemide (LASIX) 20 MG tablet Take 1 tablet (20 mg total) by mouth daily. 90 tablet 3   gabapentin (NEURONTIN) 300 MG capsule TAKE 1 CAPSULE BY MOUTH 3 TIMES A DAY 360 capsule 3   lisinopril (ZESTRIL) 5  MG tablet Take 1 tablet  (5 mg total) by mouth daily. 90 tablet 3   mirtazapine (REMERON) 7.5 MG tablet Take 1 tablet (7.5 mg total) by mouth at bedtime. 90 tablet 3   ondansetron (ZOFRAN-ODT) 4 MG disintegrating tablet Take 1 tablet (4 mg total) by mouth every 8 (eight) hours as needed for nausea or vomiting. 12 tablet 0   pantoprazole (PROTONIX) 40 MG tablet Take 1 tablet (40 mg total) by mouth 2 (two) times daily. 180 tablet 3   pravastatin (PRAVACHOL) 40 MG tablet Take 1 tablet (40 mg total) by mouth daily. 90 tablet 3   promethazine (PHENERGAN) 25 MG tablet Take 0.5 tablets (12.5 mg total) by mouth every 6 (six) hours as needed for nausea or vomiting. 12 tablet 0   sucralfate (CARAFATE) 1 g tablet Take 1 tablet (1 g total) by mouth 2 (two) times daily. 180 tablet 3   tamsulosin (FLOMAX) 0.4 MG CAPS capsule Take 2 capsules (0.8 mg total) by mouth at bedtime. For urine flow and prostate 180 capsule 3   vitamin B-12 (CYANOCOBALAMIN) 1000 MCG tablet Take 1 tablet (1,000 mcg total) by mouth daily. 30 tablet 2   No current facility-administered medications on file prior to visit.    ROS Review of Systems  Constitutional:  Negative for fever.  Respiratory:  Negative for shortness of breath.   Cardiovascular:  Negative for chest pain.  Musculoskeletal:  Negative for arthralgias.  Skin:  Negative for rash.    Objective:  BP (!) 122/51   Pulse (!) 55   Temp (!) 97.4 F (36.3 C)   Ht 5\' 8"  (1.727 m)   SpO2 99%   BMI 22.81 kg/m   BP Readings from Last 3 Encounters:  06/04/23 (!) 122/51  05/27/23 (!) 145/55  05/08/23 138/63    Wt Readings from Last 3 Encounters:  05/27/23 150 lb (68 kg)  04/02/23 150 lb (68 kg)  03/31/23 149 lb 14.6 oz (68 kg)     Physical Exam Vitals reviewed.  Constitutional:      Appearance: He is well-developed.  HENT:     Head: Normocephalic and atraumatic.     Right Ear: External ear normal.     Left Ear: External ear normal.     Mouth/Throat:     Pharynx: No oropharyngeal  exudate or posterior oropharyngeal erythema.  Eyes:     Pupils: Pupils are equal, round, and reactive to light.  Cardiovascular:     Rate and Rhythm: Normal rate and regular rhythm.     Heart sounds: No murmur heard. Pulmonary:     Effort: No respiratory distress.     Breath sounds: Normal breath sounds.  Musculoskeletal:     Cervical back: Normal range of motion and neck supple.     Comments: In wheelchair with paraplegia  Neurological:     Mental Status: He is alert and oriented to person, place, and time.       Assessment & Plan:   Eduard was seen today for medical management of chronic issues.  Diagnoses and all orders for this visit:  Paraplegia, incomplete (HCC)  Essential (primary) hypertension -     amLODipine (NORVASC) 5 MG tablet; Take 1 tablet (5 mg total) by mouth daily.  Persistent atrial fibrillation (HCC)  Other orders -     dofetilide (TIKOSYN) 500 MCG capsule; Take 1 capsule (500 mcg total) by mouth 2 (two) times daily.   Allergies as of 06/04/2023       Reactions  Valsartan Rash   Morphine And Codeine Other (See Comments)   Reaction:  Confusion/weakness/hallucinations        Medication List        Accurate as of June 04, 2023 11:59 PM. If you have any questions, ask your nurse or doctor.          amLODipine 5 MG tablet Commonly known as: NORVASC Take 1 tablet (5 mg total) by mouth daily.   apixaban 5 MG Tabs tablet Commonly known as: Eliquis Take 1 tablet (5 mg total) by mouth 2 (two) times daily.   cyanocobalamin 1000 MCG tablet Commonly known as: VITAMIN B12 Take 1 tablet (1,000 mcg total) by mouth daily.   diphenoxylate-atropine 2.5-0.025 MG tablet Commonly known as: Lomotil Take 1 tablet by mouth 4 (four) times daily as needed for diarrhea or loose stools.   dofetilide 500 MCG capsule Commonly known as: TIKOSYN Take 1 capsule (500 mcg total) by mouth 2 (two) times daily.   escitalopram 20 MG tablet Commonly known as:  LEXAPRO Take 1 tablet (20 mg total) by mouth daily.   FeroSul 325 (65 Fe) MG tablet Generic drug: ferrous sulfate TAKE 1 TABLET DAILY   finasteride 5 MG tablet Commonly known as: Proscar Take 1 tablet (5 mg total) by mouth daily. For urine flow   furosemide 20 MG tablet Commonly known as: LASIX Take 1 tablet (20 mg total) by mouth daily.   gabapentin 300 MG capsule Commonly known as: NEURONTIN TAKE 1 CAPSULE BY MOUTH 3 TIMES A DAY   lisinopril 5 MG tablet Commonly known as: ZESTRIL Take 1 tablet (5 mg total) by mouth daily.   mirtazapine 7.5 MG tablet Commonly known as: REMERON Take 1 tablet (7.5 mg total) by mouth at bedtime.   ondansetron 4 MG disintegrating tablet Commonly known as: ZOFRAN-ODT Take 1 tablet (4 mg total) by mouth every 8 (eight) hours as needed for nausea or vomiting.   pantoprazole 40 MG tablet Commonly known as: PROTONIX Take 1 tablet (40 mg total) by mouth 2 (two) times daily.   pravastatin 40 MG tablet Commonly known as: PRAVACHOL Take 1 tablet (40 mg total) by mouth daily.   promethazine 25 MG tablet Commonly known as: PHENERGAN Take 0.5 tablets (12.5 mg total) by mouth every 6 (six) hours as needed for nausea or vomiting.   sucralfate 1 g tablet Commonly known as: CARAFATE Take 1 tablet (1 g total) by mouth 2 (two) times daily.   tamsulosin 0.4 MG Caps capsule Commonly known as: FLOMAX Take 2 capsules (0.8 mg total) by mouth at bedtime. For urine flow and prostate        Meds ordered this encounter  Medications   amLODipine (NORVASC) 5 MG tablet    Sig: Take 1 tablet (5 mg total) by mouth daily.    Dispense:  90 tablet    Refill:  0   dofetilide (TIKOSYN) 500 MCG capsule    Sig: Take 1 capsule (500 mcg total) by mouth 2 (two) times daily.    Dispense:  180 capsule    Refill:  3      Follow-up: Return in about 3 months (around 09/04/2023) for diabetes.  Mechele Claude, M.D.

## 2023-06-10 ENCOUNTER — Other Ambulatory Visit: Payer: Self-pay | Admitting: Family Medicine

## 2023-07-15 DIAGNOSIS — C4441 Basal cell carcinoma of skin of scalp and neck: Secondary | ICD-10-CM | POA: Diagnosis not present

## 2023-07-15 DIAGNOSIS — L570 Actinic keratosis: Secondary | ICD-10-CM | POA: Diagnosis not present

## 2023-07-15 DIAGNOSIS — D485 Neoplasm of uncertain behavior of skin: Secondary | ICD-10-CM | POA: Diagnosis not present

## 2023-08-01 DIAGNOSIS — C4441 Basal cell carcinoma of skin of scalp and neck: Secondary | ICD-10-CM | POA: Diagnosis not present

## 2023-08-02 ENCOUNTER — Inpatient Hospital Stay: Payer: Medicare Other | Attending: Oncology

## 2023-08-02 DIAGNOSIS — D508 Other iron deficiency anemias: Secondary | ICD-10-CM

## 2023-08-02 DIAGNOSIS — D509 Iron deficiency anemia, unspecified: Secondary | ICD-10-CM | POA: Insufficient documentation

## 2023-08-02 DIAGNOSIS — Z801 Family history of malignant neoplasm of trachea, bronchus and lung: Secondary | ICD-10-CM | POA: Insufficient documentation

## 2023-08-02 LAB — CBC WITH DIFFERENTIAL/PLATELET
Abs Immature Granulocytes: 0.01 10*3/uL (ref 0.00–0.07)
Basophils Absolute: 0.1 10*3/uL (ref 0.0–0.1)
Basophils Relative: 1 %
Eosinophils Absolute: 0.4 10*3/uL (ref 0.0–0.5)
Eosinophils Relative: 6 %
HCT: 38.2 % — ABNORMAL LOW (ref 39.0–52.0)
Hemoglobin: 11.8 g/dL — ABNORMAL LOW (ref 13.0–17.0)
Immature Granulocytes: 0 %
Lymphocytes Relative: 19 %
Lymphs Abs: 1.1 10*3/uL (ref 0.7–4.0)
MCH: 28.6 pg (ref 26.0–34.0)
MCHC: 30.9 g/dL (ref 30.0–36.0)
MCV: 92.5 fL (ref 80.0–100.0)
Monocytes Absolute: 0.3 10*3/uL (ref 0.1–1.0)
Monocytes Relative: 5 %
Neutro Abs: 4 10*3/uL (ref 1.7–7.7)
Neutrophils Relative %: 69 %
Platelets: 151 10*3/uL (ref 150–400)
RBC: 4.13 MIL/uL — ABNORMAL LOW (ref 4.22–5.81)
RDW: 13.2 % (ref 11.5–15.5)
WBC: 5.9 10*3/uL (ref 4.0–10.5)
nRBC: 0 % (ref 0.0–0.2)

## 2023-08-02 LAB — IRON AND TIBC
Iron: 67 ug/dL (ref 45–182)
Saturation Ratios: 20 % (ref 17.9–39.5)
TIBC: 335 ug/dL (ref 250–450)
UIBC: 268 ug/dL

## 2023-08-02 LAB — COMPREHENSIVE METABOLIC PANEL WITH GFR
ALT: 9 U/L (ref 0–44)
AST: 13 U/L — ABNORMAL LOW (ref 15–41)
Albumin: 3.6 g/dL (ref 3.5–5.0)
Alkaline Phosphatase: 63 U/L (ref 38–126)
Anion gap: 6 (ref 5–15)
BUN: 20 mg/dL (ref 8–23)
CO2: 32 mmol/L (ref 22–32)
Calcium: 8.9 mg/dL (ref 8.9–10.3)
Chloride: 103 mmol/L (ref 98–111)
Creatinine, Ser: 0.84 mg/dL (ref 0.61–1.24)
GFR, Estimated: >60 mL/min (ref >60)
Glucose, Bld: 111 mg/dL — ABNORMAL HIGH (ref 70–99)
Potassium: 3.8 mmol/L (ref 3.5–5.1)
Sodium: 141 mmol/L (ref 135–145)
Total Bilirubin: 0.8 mg/dL (ref 0.0–1.2)
Total Protein: 5.9 g/dL — ABNORMAL LOW (ref 6.5–8.1)

## 2023-08-02 LAB — FERRITIN: Ferritin: 29 ng/mL (ref 24–336)

## 2023-08-09 ENCOUNTER — Inpatient Hospital Stay: Payer: Medicare Other | Admitting: Oncology

## 2023-08-09 VITALS — BP 111/52 | HR 52 | Temp 97.8°F | Resp 19

## 2023-08-09 DIAGNOSIS — D508 Other iron deficiency anemias: Secondary | ICD-10-CM | POA: Diagnosis not present

## 2023-08-09 DIAGNOSIS — Z801 Family history of malignant neoplasm of trachea, bronchus and lung: Secondary | ICD-10-CM | POA: Diagnosis not present

## 2023-08-09 DIAGNOSIS — D509 Iron deficiency anemia, unspecified: Secondary | ICD-10-CM | POA: Diagnosis not present

## 2023-08-09 NOTE — Progress Notes (Signed)
 [facility] [address] [place] [address]   Clinic Day:  08/09/2023  Referring physician: Roise Cleaver, MD  Patient Care Team: Roise Cleaver, MD as PCP - General (Family Medicine) Joline Ned as Consulting Physician (Neurosurgery) Secundino Dach Harvey Linen., MD as Consulting Physician (Urology) Delilah Fend, [facility] (Pharmacist) Alexia Idler, OD (Optometry)   ASSESSMENT & PLAN:   Assessment:  1.  Iron  deficiency anemia: - He has been on iron  tablet twice daily for 6 months to a year.  Last transfusion in 1987. - CBC on 04/02/2023: Hb-11.7.  Last ferritin was 29 on 03/04/2023. - Denies any BRBPR/melena.  He is on Eliquis  for couple of years.  2.  Social/family history: - Lives at home with his wife.  Non-smoker. - Brother died of lung cancer.  Mother had lung cancer.  Plan:  1.  Iron  deficiency anemia: -Hematology workup from 04/15/2023 was essentially unremarkable except for low iron  levels.  -He is currently on oral iron  supplements.  We discussed increasing iron  to twice daily with vitamin C and repeat labs in 3 months. - Lab work from 08/02/23 shows hemoglobin 11.8 (11.7) iron  saturation 20% (28%), TIBC 335 and a ferritin of 29 (25). -Denies any melena, hematochezia or bright red blood per rectum. -We discussed that although iron  saturation has dropped a bit, hemoglobin overall has improved along with his ferritin levels.  He is tolerating oral iron  well.  Continue iron  tablets daily. -Return to clinic in 6 months with labs a few days before.   No orders of the defined types were placed in this encounter.   Aurther Blue, NP   5/9/202511:40 AM  CHIEF COMPLAINT/PURPOSE OF CONSULT:   Diagnosis: Iron  deficiency anemia  Current Therapy: Oral iron    HISTORY OF PRESENT ILLNESS:   Theodore Mendoza is a 83 y.o. male presenting to clinic today for follow-up for iron  deficiency anemia.  He was last seen by me on 05/08/2023 .  He continues oral iron  twice a  day with vitamin C with good tolerance.  He received 1 dose of 1000 mg INFeD  on 12/16/2019.  He presents back today to review most recent lab work.  Since his visit, he denies any hospitalizations, surgeries or changes to his baseline health.  Reports appetite at 100% energy levels of 0%.  Denies any pain.  Has occasional shortness of breath and increased urinary frequency at night. He denies any bleeding, melena, hematochezia or bright red blood per rectum.  He uses a wheelchair.    PAST MEDICAL HISTORY:   Past Medical History: Past Medical History:  Diagnosis Date   AAA (abdominal aortic aneurysm) (HCC)    Atrial fibrillation (HCC)    Brain tumor (HCC)    x2 (benign per pt)   Cancer (HCC)    melanoma removed twice (head and right forearm)   Cardiac arrhythmia    CHF (congestive heart failure) (HCC)    GERD (gastroesophageal reflux disease)    Guillain-Barre syndrome (HCC) 02/13/1986   Headache    Hypertension    Kidney stones    Myocardial infarction [facility]) 2007    Surgical History: Past Surgical History:  Procedure Laterality Date   ABDOMINAL AORTIC ENDOVASCULAR STENT GRAFT Bilateral 04/04/2022   Procedure: ABDOMINAL AORTIC ENDOVASCULAR STENT GRAFT;  Surgeon: Margherita Shell, MD;  Location: [facility] OR;  Service: Vascular;  Laterality: Bilateral;   COLONOSCOPY WITH PROPOFOL  N/A 08/14/2020   Procedure: COLONOSCOPY WITH PROPOFOL ;  Surgeon: Ruby Corporal, MD;  [facility]:  [facility];  Service: Endoscopy;  Laterality: N/A;   CORONARY ANGIOPLASTY  1997   after mi   CYSTOSCOPY WITH RETROGRADE PYELOGRAM, URETEROSCOPY AND STENT PLACEMENT Left 06/28/2014   Procedure: 1ST STAGE CYSTOSCOPY/URETEROSCOPY/STENT PLACEMENT;  Surgeon: Osborn Blaze, MD;  Location: [facility]S;  Service: Urology;  Laterality: Left;   CYSTOSCOPY WITH STENT PLACEMENT Left 05/08/2014   Procedure: CYSTOSCOPY, RETROGRADE PYELOGRAM WITH LEFT URETERAL STENT PLACEMENT;  Surgeon: Osborn Blaze, MD;  Location: [facility];  Service:  Urology;  Laterality: Left;   ENTEROSCOPY N/A 09/16/2020   Procedure: PUSH ENTEROSCOPY;  Surgeon: Urban Garden, MD;  Location: [facility];  Service: Gastroenterology;  Laterality: N/A;   ENTEROSCOPY N/A 10/25/2020   Procedure: PUSH ENTEROSCOPY;  Surgeon: Urban Garden, MD;  [facility]: [facility];  Service: Gastroenterology;  Laterality: N/A;   ESOPHAGOGASTRODUODENOSCOPY (EGD) WITH PROPOFOL  N/A 12/16/2019   Procedure: ESOPHAGOGASTRODUODENOSCOPY (EGD) WITH PROPOFOL ;  Surgeon: Sergio Dandy, MD;  [facility];  Service: Endoscopy;  Laterality: N/A;   ESOPHAGOGASTRODUODENOSCOPY (EGD) WITH PROPOFOL  N/A 08/13/2020   Procedure: ESOPHAGOGASTRODUODENOSCOPY (EGD) WITH PROPOFOL ;  Surgeon: Ruby Corporal, MD;  [facility];  Service: Endoscopy;  Laterality: N/A;   ESOPHAGOGASTRODUODENOSCOPY (EGD) WITH PROPOFOL  N/A 09/16/2020   Procedure: ESOPHAGOGASTRODUODENOSCOPY (EGD) WITH PROPOFOL ;  Surgeon: Urban Garden, MD;  [facility];  Service: Gastroenterology;  Laterality: N/A;  1:55   ESOPHAGOGASTRODUODENOSCOPY (EGD) WITH PROPOFOL  N/A 10/25/2020   Procedure: ESOPHAGOGASTRODUODENOSCOPY (EGD) WITH PROPOFOL ;  Surgeon: Urban Garden, MD;  [facility];  Service: Gastroenterology;  Laterality: N/A;  2:00   EYE SURGERY Right may 2015   growth removed, july 2015left eye cataract removed, right eye catarct removed   gamma kniferadiation treatment  Feb 09 2014   [facility]   GIVENS CAPSULE STUDY N/A 10/11/2020   Procedure: GIVENS CAPSULE STUDY;  Surgeon: Urban Garden, MD;  [facility];  Service: Gastroenterology;  Laterality: N/A;  7:30   HEMOSTASIS CLIP PLACEMENT  12/16/2019   Procedure: HEMOSTASIS CLIP PLACEMENT;  Surgeon: Sergio Dandy, MD;  [facility];  Service: Endoscopy;;   HEMOSTASIS CONTROL  12/16/2019   Procedure: HEMOSTASIS CONTROL;  Surgeon: Sergio Dandy, MD;  [facility]: [facility];  Service: Endoscopy;;  Endoloop   HOLMIUM LASER APPLICATION Left 06/28/2014   Procedure: HOLMIUM LASER APPLICATION;  Surgeon: Osborn Blaze, MD;  Location: [facility];  Service: Urology;  Laterality: Left;   LITHOTRIPSY  years ago   POLYPECTOMY  12/16/2019   Procedure: POLYPECTOMY;  Surgeon: Sergio Dandy, MD;  [facility];  Service: Endoscopy;;   STONE EXTRACTION WITH BASKET Left 06/28/2014   Procedure: STONE EXTRACTION WITH BASKET;  Surgeon: Osborn Blaze, MD;  Location: [facility];  Service: Urology;  Laterality: Left;    Social History: Social History   Socioeconomic History   Marital status: Married    Spouse name: Monroe Antigua   Number of children: 1   Years of education: Not on file   Highest education level: Not on file  Occupational History   Occupation: retired   Tobacco Use   Smoking status: Never    Passive exposure: Never   Smokeless tobacco: Never  Vaping Use   Vaping status: Never Used  Substance and Sexual Activity   Alcohol use: No   Drug use: No   Sexual activity: Not Currently  Other Topics Concern   Not on file  Social History Narrative   Lives with wife - they  have a basement but don't use it   One daughter - husband is a Programmer, multimedia and they go to his church   Patient is paraplegic - uses wheelchair, sometimes motorized   He does still drive using hand controls.   Social Drivers of Corporate investment banker Strain: Low Risk  (06/08/2022)   Overall Financial Resource Strain (CARDIA)    Difficulty of Paying Living Expenses: Not hard at all  Food Insecurity: No Food Insecurity (08/01/2022)   Hunger Vital Sign    Worried About Running Out of Food in the Last Year: Never true    Ran Out of Food in the Last Year: Never true  Transportation Needs: No Transportation Needs (08/06/2022)   PRAPARE - Administrator, Civil Service (Medical): No    Lack of Transportation (Non-Medical): No  Physical Activity:  Insufficiently Active (06/08/2022)   Exercise Vital Sign    Days of Exercise per Week: 3 days    Minutes of Exercise per Session: 30 min  Stress: No Stress Concern Present (06/08/2022)   [employer] of Occupational Health - Occupational Stress Questionnaire    Feeling of Stress : Not at all  Social Connections: Moderately Integrated (06/08/2022)   Social Connection and Isolation Panel [NHANES]    Frequency of Communication with Friends and Family: More than three times a week    Frequency of Social Gatherings with Friends and Family: More than three times a week    Attends Religious Services: More than 4 times per year    Active Member of [employer] or Organizations: No    Attends Banker Meetings: Never    Marital Status: Married  Catering manager Violence: Not At Risk (08/01/2022)   Humiliation, Afraid, Rape, and Kick questionnaire    Fear of Current or Ex-Partner: No    Emotionally Abused: No    Physically Abused: No    Sexually Abused: No    Family History: Family History  Problem Relation Age of Onset   Diabetes Mother    Lung cancer Mother    CAD Father    Diabetes Brother    Diabetes Sister    Stroke Sister    Other Maternal Grandfather        brain tumor   Colon cancer Neg Hx    Pancreatic cancer Neg Hx    Esophageal cancer Neg Hx     Current Medications:  Current Outpatient Medications:    amLODipine  (NORVASC ) 5 MG tablet, Take 1 tablet (5 mg total) by mouth daily., Disp: 90 tablet, Rfl: 0   apixaban  (ELIQUIS ) 5 MG TABS tablet, Take 1 tablet (5 mg total) by mouth 2 (two) times daily., Disp: 180 tablet, Rfl: 3   diphenoxylate -atropine  (LOMOTIL ) 2.5-0.025 MG tablet, Take 1 tablet by mouth 4 (four) times daily as needed for diarrhea or loose stools., Disp: 40 tablet, Rfl: 1   dofetilide  (TIKOSYN ) 500 MCG capsule, Take 1 capsule (500 mcg total) by mouth 2 (two) times daily., Disp: 180 capsule, Rfl: 3   escitalopram  (LEXAPRO ) 20 MG tablet, Take 1 tablet (20 mg  total) by mouth daily., Disp: 90 tablet, Rfl: 3   FEROSUL 325 (65 Fe) MG tablet, TAKE 1 TABLET DAILY, Disp: 90 tablet, Rfl: 0   finasteride  (PROSCAR ) 5 MG tablet, Take 1 tablet (5 mg total) by mouth daily. For urine flow, Disp: 90 tablet, Rfl: 3   furosemide  (LASIX ) 20 MG tablet, Take 1 tablet (20 mg total) by mouth daily., Disp: 90 tablet, Rfl:  3   gabapentin  (NEURONTIN ) 300 MG capsule, TAKE 1 CAPSULE BY MOUTH 3 TIMES A DAY, Disp: 360 capsule, Rfl: 3   lisinopril  (ZESTRIL ) 5 MG tablet, Take 1 tablet (5 mg total) by mouth daily., Disp: 90 tablet, Rfl: 3   mirtazapine  (REMERON ) 7.5 MG tablet, Take 1 tablet (7.5 mg total) by mouth at bedtime., Disp: 90 tablet, Rfl: 3   ondansetron  (ZOFRAN -ODT) 4 MG disintegrating tablet, Take 1 tablet (4 mg total) by mouth every 8 (eight) hours as needed for nausea or vomiting., Disp: 12 tablet, Rfl: 0   pantoprazole  (PROTONIX ) 40 MG tablet, Take 1 tablet (40 mg total) by mouth 2 (two) times daily., Disp: 180 tablet, Rfl: 3   pravastatin  (PRAVACHOL ) 40 MG tablet, Take 1 tablet (40 mg total) by mouth daily., Disp: 90 tablet, Rfl: 3   promethazine  (PHENERGAN ) 25 MG tablet, Take 0.5 tablets (12.5 mg total) by mouth every 6 (six) hours as needed for nausea or vomiting., Disp: 12 tablet, Rfl: 0   sucralfate  (CARAFATE ) 1 g tablet, Take 1 tablet (1 g total) by mouth 2 (two) times daily., Disp: 180 tablet, Rfl: 3   tamsulosin  (FLOMAX ) 0.4 MG CAPS capsule, Take 2 capsules (0.8 mg total) by mouth at bedtime. For urine flow and prostate, Disp: 180 capsule, Rfl: 3   vitamin B-12 (CYANOCOBALAMIN ) 1000 MCG tablet, Take 1 tablet (1,000 mcg total) by mouth daily., Disp: 30 tablet, Rfl: 2   Allergies: Allergies  Allergen Reactions   Valsartan Rash   Morphine  And Codeine Other (See Comments)    Reaction:  Confusion/weakness/hallucinations    REVIEW OF SYSTEMS:   Review of Systems  Constitutional:  Positive for appetite change and fatigue.  Respiratory:  Positive for shortness  of breath.   Genitourinary:  Positive for frequency.   Psychiatric/Behavioral:  Positive for sleep disturbance.      VITALS:   There were no vitals taken for this visit.  Wt Readings from Last 3 Encounters:  05/27/23 150 lb (68 kg)  04/02/23 150 lb (68 kg)  03/31/23 149 lb 14.6 oz (68 kg)    There is no height or weight on file to calculate BMI.   PHYSICAL EXAM:   Physical Exam Constitutional:      Appearance: Normal appearance.  Cardiovascular:     Rate and Rhythm: Normal rate and regular rhythm.  Pulmonary:     Effort: Pulmonary effort is normal.     Breath sounds: Normal breath sounds.  Abdominal:     General: Bowel sounds are normal.     Palpations: Abdomen is soft.  Musculoskeletal:        General: No swelling. Normal range of motion.  Neurological:     Mental Status: He is alert and oriented to person, place, and time. Mental status is at baseline.     LABS:   CBC    Component Value Date/Time   WBC 5.9 08/02/2023 1004   RBC 4.13 (L) 08/02/2023 1004   HGB 11.8 (L) 08/02/2023 1004   HGB 10.8 (L) 03/19/2023 1218   HCT 38.2 (L) 08/02/2023 1004   HCT 34.4 (L) 03/19/2023 1218   PLT 151 08/02/2023 1004   PLT 128 (L) 03/19/2023 1218   MCV 92.5 08/02/2023 1004   MCV 93 03/19/2023 1218   MCH 28.6 08/02/2023 1004   MCHC 30.9 08/02/2023 1004   RDW 13.2 08/02/2023 1004   RDW 13.0 03/19/2023 1218   LYMPHSABS 1.1 08/02/2023 1004   LYMPHSABS 1.0 03/19/2023 1218   MONOABS 0.3  08/02/2023 1004   EOSABS 0.4 08/02/2023 1004   EOSABS 0.2 03/19/2023 1218   BASOSABS 0.1 08/02/2023 1004   BASOSABS 0.1 03/19/2023 1218    CMP    Component Value Date/Time   NA 141 08/02/2023 1004   NA 149 (H) 03/19/2023 1218   K 3.8 08/02/2023 1004   CL 103 08/02/2023 1004   CO2 32 08/02/2023 1004   GLUCOSE 111 (H) 08/02/2023 1004   BUN 20 08/02/2023 1004   BUN 11 03/19/2023 1218   CREATININE 0.84 08/02/2023 1004   CALCIUM 8.9 08/02/2023 1004   PROT 5.9 (L) 08/02/2023 1004    PROT 5.7 (L) 03/19/2023 1218   ALBUMIN 3.6 08/02/2023 1004   ALBUMIN 3.9 03/19/2023 1218   AST 13 (L) 08/02/2023 1004   ALT 9 08/02/2023 1004   ALKPHOS 63 08/02/2023 1004   BILITOT 0.8 08/02/2023 1004   BILITOT 0.5 03/19/2023 1218   GFRNONAA >60 08/02/2023 1004   GFRAA 97 05/06/2020 1057    No results found for: "CEA1", "CEA" / No results found for: "CEA1", "CEA" Lab Results  Component Value Date   PSA1 3.8 02/25/2023   No results found for: "ZOX096" No results found for: "CAN125"  Lab Results  Component Value Date   TOTALPROTELP 5.9 (L) 04/15/2023   TOTALPROTELP 5.8 (L) 04/15/2023   ALBUMINELP 3.9 04/15/2023   A1GS 0.2 04/15/2023   A2GS 0.4 04/15/2023   BETS 0.7 04/15/2023   GAMS 0.7 04/15/2023   MSPIKE Not Observed 04/15/2023   SPEI Comment 04/15/2023   Lab Results  Component Value Date   TIBC 335 08/02/2023   TIBC 350 04/15/2023   TIBC 312 03/04/2023   FERRITIN 29 08/02/2023   FERRITIN 25 04/15/2023   FERRITIN 29 03/04/2023   IRONPCTSAT 20 08/02/2023   IRONPCTSAT 28 04/15/2023   IRONPCTSAT 18 (L) 03/04/2023   Lab Results  Component Value Date   LDH 116 04/15/2023     STUDIES:   No results found.

## 2023-08-22 DIAGNOSIS — H532 Diplopia: Secondary | ICD-10-CM | POA: Diagnosis not present

## 2023-08-22 DIAGNOSIS — H5581 Saccadic eye movements: Secondary | ICD-10-CM | POA: Diagnosis not present

## 2023-08-22 DIAGNOSIS — H5032 Intermittent alternating esotropia: Secondary | ICD-10-CM | POA: Diagnosis not present

## 2023-09-03 DIAGNOSIS — N480 Leukoplakia of penis: Secondary | ICD-10-CM | POA: Diagnosis not present

## 2023-09-03 DIAGNOSIS — N202 Calculus of kidney with calculus of ureter: Secondary | ICD-10-CM | POA: Diagnosis not present

## 2023-09-03 DIAGNOSIS — R34 Anuria and oliguria: Secondary | ICD-10-CM | POA: Diagnosis not present

## 2023-09-03 DIAGNOSIS — N281 Cyst of kidney, acquired: Secondary | ICD-10-CM | POA: Diagnosis not present

## 2023-09-05 ENCOUNTER — Telehealth (INDEPENDENT_AMBULATORY_CARE_PROVIDER_SITE_OTHER): Payer: Self-pay

## 2023-09-05 ENCOUNTER — Ambulatory Visit: Admitting: Family Medicine

## 2023-09-05 ENCOUNTER — Other Ambulatory Visit (INDEPENDENT_AMBULATORY_CARE_PROVIDER_SITE_OTHER): Payer: Self-pay

## 2023-09-05 DIAGNOSIS — D509 Iron deficiency anemia, unspecified: Secondary | ICD-10-CM

## 2023-09-05 NOTE — Telephone Encounter (Signed)
 I spoke with the patient daughter she was made aware the patient needed a follow up cbc and fe studies for a follow up from December 2024 per Dr. Sammi Crick. The lab orders are placed in Epic to have done at [facility] this week 09/05/2023. Patient daughter states understanding.

## 2023-09-06 LAB — CBC WITH DIFFERENTIAL/PLATELET
Absolute Lymphocytes: 930 {cells}/uL (ref 850–3900)
Absolute Monocytes: 300 {cells}/uL (ref 200–950)
Basophils Absolute: 60 {cells}/uL (ref 0–200)
Basophils Relative: 1.2 %
Eosinophils Absolute: 370 {cells}/uL (ref 15–500)
Eosinophils Relative: 7.4 %
HCT: 35.7 % — ABNORMAL LOW (ref 38.5–50.0)
Hemoglobin: 11 g/dL — ABNORMAL LOW (ref 13.2–17.1)
MCH: 29.2 pg (ref 27.0–33.0)
MCHC: 30.8 g/dL — ABNORMAL LOW (ref 32.0–36.0)
MCV: 94.7 fL (ref 80.0–100.0)
MPV: 10.9 fL (ref 7.5–12.5)
Monocytes Relative: 6 %
Neutro Abs: 3340 {cells}/uL (ref 1500–7800)
Neutrophils Relative %: 66.8 %
Platelets: 144 10*3/uL (ref 140–400)
RBC: 3.77 10*6/uL — ABNORMAL LOW (ref 4.20–5.80)
RDW: 13.1 % (ref 11.0–15.0)
Total Lymphocyte: 18.6 %
WBC: 5 10*3/uL (ref 3.8–10.8)

## 2023-09-06 LAB — IRON,TIBC AND FERRITIN PANEL
%SAT: 26 % (ref 20–48)
Ferritin: 31 ng/mL (ref 24–380)
Iron: 79 ug/dL (ref 50–180)
TIBC: 308 ug/dL (ref 250–425)

## 2023-09-07 ENCOUNTER — Ambulatory Visit (INDEPENDENT_AMBULATORY_CARE_PROVIDER_SITE_OTHER): Payer: Self-pay | Admitting: Gastroenterology

## 2023-09-09 ENCOUNTER — Encounter: Payer: Self-pay | Admitting: Family Medicine

## 2023-09-09 ENCOUNTER — Ambulatory Visit (INDEPENDENT_AMBULATORY_CARE_PROVIDER_SITE_OTHER): Admitting: Family Medicine

## 2023-09-09 VITALS — BP 137/63 | HR 59 | Temp 97.8°F | Ht 68.0 in

## 2023-09-09 DIAGNOSIS — I4819 Other persistent atrial fibrillation: Secondary | ICD-10-CM | POA: Diagnosis not present

## 2023-09-09 DIAGNOSIS — N401 Enlarged prostate with lower urinary tract symptoms: Secondary | ICD-10-CM

## 2023-09-09 DIAGNOSIS — E782 Mixed hyperlipidemia: Secondary | ICD-10-CM

## 2023-09-09 DIAGNOSIS — G8222 Paraplegia, incomplete: Secondary | ICD-10-CM

## 2023-09-09 DIAGNOSIS — I10 Essential (primary) hypertension: Secondary | ICD-10-CM | POA: Diagnosis not present

## 2023-09-09 DIAGNOSIS — K219 Gastro-esophageal reflux disease without esophagitis: Secondary | ICD-10-CM

## 2023-09-09 DIAGNOSIS — R350 Frequency of micturition: Secondary | ICD-10-CM

## 2023-09-09 MED ORDER — FINASTERIDE 5 MG PO TABS: 5.0000 mg | ORAL_TABLET | Freq: Every day | ORAL | 3 refills | Status: AC

## 2023-09-09 MED ORDER — AMLODIPINE BESYLATE 5 MG PO TABS
5.0000 mg | ORAL_TABLET | Freq: Every day | ORAL | 0 refills | Status: DC
Start: 2023-09-09 — End: 2023-09-24

## 2023-09-09 NOTE — Progress Notes (Signed)
 Subjective:  Patient ID: Theodore Mendoza, male    DOB: [birth year]  Age: 83 y.o. MRN: [id number]  CC: Follow-up (Needs a script for electric wheel chair. His insurance states they will cover with letter and dx.  )   HPI Theodore Mendoza presents for follow-up of his Guillain-Barr syndrome.  He has been a paraplegic wheelchair-bound for over 30 years.  His wheelchair is worn out he is now using a manual but needs a electric chair because his upper body strength is too poor to allow him to get around adequately.  His wife does push him some.   Patient in for follow-up of atrial fibrillation. Patient denies any recent bouts of chest pain or palpitations. Additionally, patient is taking anticoagulants. Patient denies any recent excessive bleeding episodes including epistaxis, bleeding from the gums, genitalia, rectal bleeding or hematuria. Additionally there has been no excessive bruising.  Follow-up of hypertension. Patient has no history of headache chest pain or shortness of breath or recent cough. Patient also denies symptoms of TIA such as numbness weakness lateralizing. Patient checks  blood pressure at home and has not had any elevated readings recently. Patient denies side effects from his medication. States taking it regularly. Patient in for follow-up of elevated cholesterol. Doing well without complaints on current medication. Denies side effects of statin including myalgia and arthralgia and nausea. Also in today for liver function testing. Currently no chest pain, shortness of breath or other cardiovascular related symptoms noted.  Patient in for follow-up of GERD. Currently asymptomatic taking  PPI daily. There is no chest pain or heartburn. No hematemesis and no melena. No dysphagia or choking. Onset is remote. Progression is stable. Complicating factors, none.  Patient is treated for depression and continues to have some symptoms see below.     09/09/2023   11:04 AM 06/04/2023   11:10 AM  04/09/2023    2:33 PM  Depression screen PHQ 2/9  Decreased Interest 2 2 1   Down, Depressed, Hopeless 2 1 1   PHQ - 2 Score 4 3 2   Altered sleeping 3 2 0  Tired, decreased energy 3 3 3   Change in appetite 0 2 1  Feeling bad or failure about yourself  2 1 1   Trouble concentrating 1 0 0  Moving slowly or fidgety/restless 0 0 0  Suicidal thoughts 0 1 0  PHQ-9 Score 13 12 7   Difficult doing work/chores  Very difficult Extremely dIfficult    History Kenner has a past medical history of AAA (abdominal aortic aneurysm) (HCC), Atrial fibrillation (HCC), Brain tumor (HCC), Cancer (HCC), Cardiac arrhythmia, CHF (congestive heart failure) (HCC), GERD (gastroesophageal reflux disease), Guillain-Barre syndrome (HCC) (02/13/1986), Headache, Hypertension, Kidney stones, and Myocardial infarction (HCC) (2007).   He has a past surgical history that includes Cystoscopy with stent placement (Left, 05/08/2014); Coronary angioplasty (1997); Eye surgery (Right, may 2015); gamma kniferadiation treatment (Feb 09 2014); Lithotripsy (years ago); Cystoscopy with retrograde pyelogram, ureteroscopy and stent placement (Left, 06/28/2014); Holmium laser application (Left, 06/28/2014); Stone extraction with basket (Left, 06/28/2014); Esophagogastroduodenoscopy (egd) with propofol  (N/A, 12/16/2019); Hemostasis clip placement (12/16/2019); Hemostasis control (12/16/2019); polypectomy (12/16/2019); Esophagogastroduodenoscopy (egd) with propofol  (N/A, 08/13/2020); Colonoscopy with propofol  (N/A, 08/14/2020); Esophagogastroduodenoscopy (egd) with propofol  (N/A, 09/16/2020); enteroscopy (N/A, 09/16/2020); Givens capsule study (N/A, 10/11/2020); Esophagogastroduodenoscopy (egd) with propofol  (N/A, 10/25/2020); enteroscopy (N/A, 10/25/2020); and Abdominal aortic endovascular stent graft (Bilateral, 04/04/2022).   His family history includes CAD in his father; Diabetes in his brother, mother, and sister; Lung cancer in his  mother; Other in his maternal  grandfather; Stroke in his sister.He reports that he has never smoked. He has never been exposed to tobacco smoke. He has never used smokeless tobacco. He reports that he does not drink alcohol and does not use drugs.    ROS Review of Systems  Constitutional: Negative.  Negative for fever.  HENT: Negative.    Eyes:  Negative for visual disturbance.  Respiratory:  Negative for cough and shortness of breath.   Cardiovascular:  Negative for chest pain and leg swelling.  Gastrointestinal:  Negative for abdominal pain, diarrhea, nausea and vomiting.  Genitourinary:  Negative for difficulty urinating.  Musculoskeletal:  Positive for gait problem (Paraplegic, wheelchair-bound.  Needs a new chair.  Patient will visit a vendor and pick out a chair and have them send me paperwork.  Then we will set up a visit to complete that paperwork.). Negative for arthralgias and myalgias.  Skin:  Negative for rash.  Neurological:  Negative for headaches.  Psychiatric/Behavioral:  Negative for sleep disturbance.     Objective:  BP 137/63   Pulse (!) 59   Temp 97.8 F (36.6 C)   Ht 5\' 8"  (1.727 m)   SpO2 97%   BMI 22.81 kg/m   BP Readings from Last 3 Encounters:  09/09/23 137/63  08/09/23 (!) 111/52  06/04/23 (!) 122/51    Wt Readings from Last 3 Encounters:  05/27/23 150 lb (68 kg)  04/02/23 150 lb (68 kg)  03/31/23 149 lb 14.6 oz (68 kg)     Physical Exam Vitals reviewed.  Constitutional:      Appearance: He is well-developed.  HENT:     Head: Normocephalic and atraumatic.     Right Ear: External ear normal.     Left Ear: External ear normal.     Mouth/Throat:     Pharynx: No oropharyngeal exudate or posterior oropharyngeal erythema.  Eyes:     Pupils: Pupils are equal, round, and reactive to light.  Cardiovascular:     Rate and Rhythm: Normal rate and regular rhythm.     Heart sounds: No murmur heard. Pulmonary:     Effort: No respiratory distress.     Breath sounds: Normal  breath sounds.  Abdominal:     Palpations: Abdomen is soft.     Tenderness: There is no abdominal tenderness.  Musculoskeletal:     Cervical back: Normal range of motion and neck supple.     Comments: Wheelchair-bound.  Legs are flaccid with little muscle tone.  Neurological:     Mental Status: He is alert and oriented to person, place, and time.      Assessment & Plan:  Mixed hyperlipidemia -     Lipid panel  Essential (primary) hypertension -     amLODIPine  Besylate; Take 1 tablet (5 mg total) by mouth daily.  Dispense: 90 tablet; Refill: 0  Persistent atrial fibrillation (HCC)  Paraplegia, incomplete (HCC)  Gastroesophageal reflux disease, unspecified whether esophagitis present  Benign prostatic hyperplasia with urinary frequency -     Finasteride ; Take 1 tablet (5 mg total) by mouth daily. For urine flow  Dispense: 90 tablet; Refill: 3  Medications were reviewed and are consistent with the above problems and are up-to-date.  These conditions are stable and medicine will continue as is.   Follow-up: Return in about 3 months (around 12/10/2023).  Roise Cleaver, M.D.

## 2023-09-10 ENCOUNTER — Ambulatory Visit

## 2023-09-10 VITALS — BP 137/63 | HR 59 | Ht 68.0 in

## 2023-09-10 DIAGNOSIS — Z0000 Encounter for general adult medical examination without abnormal findings: Secondary | ICD-10-CM

## 2023-09-10 LAB — LIPID PANEL
Chol/HDL Ratio: 2.2 ratio (ref 0.0–5.0)
Cholesterol, Total: 104 mg/dL (ref 100–199)
HDL: 47 mg/dL (ref >39)
LDL Chol Calc (NIH): 46 mg/dL (ref 0–99)
Triglycerides: 39 mg/dL (ref 0–149)
VLDL Cholesterol Cal: 11 mg/dL (ref 5–40)

## 2023-09-10 NOTE — Progress Notes (Signed)
 Subjective:   Theodore Mendoza is a 83 y.o. who presents for a Medicare Wellness preventive visit.  As a reminder, Annual Wellness Visits don't include a physical exam, and some assessments may be limited, especially if this visit is performed virtually. We may recommend an in-person follow-up visit with your provider if needed.  Visit Complete: Virtual I connected with  Theodore Mendoza on 09/10/23 by a audio enabled telemedicine application and verified that I am speaking with the correct person using two identifiers.  Patient Location: Home  Provider Location: Home Office  I discussed the limitations of evaluation and management by telemedicine. The patient expressed understanding and agreed to proceed.  Vital Signs: Because this visit was a virtual/telehealth visit, some criteria may be missing or patient reported. Any vitals not documented were not able to be obtained and vitals that have been documented are patient reported.  VideoDeclined- This patient declined Librarian, academic. Therefore the visit was completed with audio only.  Persons Participating in Visit: Patient.  AWV Questionnaire: No: Patient Medicare AWV questionnaire was not completed prior to this visit.  Cardiac Risk Factors include: advanced age (>94men, >33 women);dyslipidemia;hypertension;male gender     Objective:     Today's Vitals   09/10/23 1425  BP: 137/63  Pulse: (!) 59  Height: 5\' 8"  (1.727 m)   Body mass index is 22.81 kg/m.     09/10/2023    2:17 PM 08/09/2023   11:45 AM 05/08/2023   10:42 AM 04/15/2023    1:52 PM 04/02/2023    4:48 PM 03/31/2023    5:32 PM 12/17/2022    9:54 AM  Advanced Directives  Does Patient Have a Medical Advance Directive? Yes No Yes No No No No  Type of Advance Directive Living will  Living will      Does patient want to make changes to medical advance directive?  No - Patient declined No - Patient declined      Would patient like  information on creating a medical advance directive?  No - Patient declined No - Patient declined No - Patient declined No - Patient declined No - Patient declined No - Patient declined    Current Medications (verified) Outpatient Encounter Medications as of 09/10/2023  Medication Sig   amLODipine  (NORVASC ) 5 MG tablet Take 1 tablet (5 mg total) by mouth daily.   apixaban  (ELIQUIS ) 5 MG TABS tablet Take 1 tablet (5 mg total) by mouth 2 (two) times daily.   diphenoxylate -atropine  (LOMOTIL ) 2.5-0.025 MG tablet Take 1 tablet by mouth 4 (four) times daily as needed for diarrhea or loose stools.   dofetilide  (TIKOSYN ) 500 MCG capsule Take 1 capsule (500 mcg total) by mouth 2 (two) times daily.   escitalopram  (LEXAPRO ) 20 MG tablet Take 1 tablet (20 mg total) by mouth daily.   FEROSUL 325 (65 Fe) MG tablet TAKE 1 TABLET DAILY   finasteride  (PROSCAR ) 5 MG tablet Take 1 tablet (5 mg total) by mouth daily. For urine flow   furosemide  (LASIX ) 20 MG tablet Take 1 tablet (20 mg total) by mouth daily.   gabapentin  (NEURONTIN ) 300 MG capsule TAKE 1 CAPSULE BY MOUTH 3 TIMES A DAY   lisinopril  (ZESTRIL ) 5 MG tablet Take 1 tablet (5 mg total) by mouth daily.   mirtazapine  (REMERON ) 7.5 MG tablet Take 1 tablet (7.5 mg total) by mouth at bedtime.   ondansetron  (ZOFRAN -ODT) 4 MG disintegrating tablet Take 1 tablet (4 mg total) by mouth every 8 (eight)  hours as needed for nausea or vomiting.   pantoprazole  (PROTONIX ) 40 MG tablet Take 1 tablet (40 mg total) by mouth 2 (two) times daily.   pravastatin  (PRAVACHOL ) 40 MG tablet Take 1 tablet (40 mg total) by mouth daily.   promethazine  (PHENERGAN ) 25 MG tablet Take 0.5 tablets (12.5 mg total) by mouth every 6 (six) hours as needed for nausea or vomiting.   sucralfate  (CARAFATE ) 1 g tablet Take 1 tablet (1 g total) by mouth 2 (two) times daily.   sulfamethoxazole -trimethoprim  (BACTRIM ) 400-80 MG tablet Take 1 tablet by mouth 2 (two) times daily.   tamsulosin  (FLOMAX )  0.4 MG CAPS capsule Take 2 capsules (0.8 mg total) by mouth at bedtime. For urine flow and prostate   vitamin B-12 (CYANOCOBALAMIN ) 1000 MCG tablet Take 1 tablet (1,000 mcg total) by mouth daily.   No facility-administered encounter medications on file as of 09/10/2023.    Allergies (verified) Valsartan and Morphine  and codeine   History: Past Medical History:  Diagnosis Date   AAA (abdominal aortic aneurysm) (HCC)    Atrial fibrillation (HCC)    Brain tumor (HCC)    x2 (benign per pt)   Cancer (HCC)    melanoma removed twice (head and right forearm)   Cardiac arrhythmia    CHF (congestive heart failure) (HCC)    GERD (gastroesophageal reflux disease)    Guillain-Barre syndrome (HCC) 02/13/1986   Headache    Hypertension    Kidney stones    Myocardial infarction [facility]) 2007   Past Surgical History:  Procedure Laterality Date   ABDOMINAL AORTIC ENDOVASCULAR STENT GRAFT Bilateral 04/04/2022   Procedure: ABDOMINAL AORTIC ENDOVASCULAR STENT GRAFT;  Surgeon: Margherita Shell, MD;  Location: [facility];  Service: Vascular;  Laterality: Bilateral;   COLONOSCOPY WITH PROPOFOL  N/A 08/14/2020   Procedure: COLONOSCOPY WITH PROPOFOL ;  Surgeon: Ruby Corporal, MD;  Location: [facility];  Service: Endoscopy;  Laterality: N/A;   CORONARY ANGIOPLASTY  1997   after mi   CYSTOSCOPY WITH RETROGRADE PYELOGRAM, URETEROSCOPY AND STENT PLACEMENT Left 06/28/2014   Procedure: 1ST STAGE CYSTOSCOPY/URETEROSCOPY/STENT PLACEMENT;  Surgeon: Osborn Blaze, MD;  Location: [facility];  Service: Urology;  Laterality: Left;   CYSTOSCOPY WITH STENT PLACEMENT Left 05/08/2014   Procedure: CYSTOSCOPY, RETROGRADE PYELOGRAM WITH LEFT URETERAL STENT PLACEMENT;  Surgeon: Osborn Blaze, MD;  Location: [facility];  Service: Urology;  Laterality: Left;   ENTEROSCOPY N/A 09/16/2020   Procedure: PUSH ENTEROSCOPY;  Surgeon: Urban Garden, MD;  [facility];  Service: Gastroenterology;  Laterality: N/A;   ENTEROSCOPY  N/A 10/25/2020   Procedure: PUSH ENTEROSCOPY;  Surgeon: Urban Garden, MD;  [facility];  Service: Gastroenterology;  Laterality: N/A;   ESOPHAGOGASTRODUODENOSCOPY (EGD) WITH PROPOFOL  N/A 12/16/2019   Procedure: ESOPHAGOGASTRODUODENOSCOPY (EGD) WITH PROPOFOL ;  Surgeon: Sergio Dandy, MD;  [facility];  Service: Endoscopy;  Laterality: N/A;   ESOPHAGOGASTRODUODENOSCOPY (EGD) WITH PROPOFOL  N/A 08/13/2020   Procedure: ESOPHAGOGASTRODUODENOSCOPY (EGD) WITH PROPOFOL ;  Surgeon: Ruby Corporal, MD;  [facility];  Service: Endoscopy;  Laterality: N/A;   ESOPHAGOGASTRODUODENOSCOPY (EGD) WITH PROPOFOL  N/A 09/16/2020   Procedure: ESOPHAGOGASTRODUODENOSCOPY (EGD) WITH PROPOFOL ;  Surgeon: Urban Garden, MD;  [facility];  Service: Gastroenterology;  Laterality: N/A;  1:55   ESOPHAGOGASTRODUODENOSCOPY (EGD) WITH PROPOFOL  N/A 10/25/2020   Procedure: ESOPHAGOGASTRODUODENOSCOPY (EGD) WITH PROPOFOL ;  Surgeon: Urban Garden, MD;  [facility];  Service: Gastroenterology;  Laterality: N/A;  2:00   EYE SURGERY Right may  2015   growth removed, july 2015left eye cataract removed, right eye catarct removed   gamma kniferadiation treatment  Feb 09 2014   [facility]   GIVENS CAPSULE STUDY N/A 10/11/2020   Procedure: GIVENS CAPSULE STUDY;  Surgeon: Urban Garden, MD;  [facility];  Service: Gastroenterology;  Laterality: N/A;  7:30   HEMOSTASIS CLIP PLACEMENT  12/16/2019   Procedure: HEMOSTASIS CLIP PLACEMENT;  Surgeon: Sergio Dandy, MD;  [facility];  Service: Endoscopy;;   HEMOSTASIS CONTROL  12/16/2019   Procedure: HEMOSTASIS CONTROL;  Surgeon: Sergio Dandy, MD;  [facility];  Service: Endoscopy;;  Endoloop   HOLMIUM LASER APPLICATION Left 06/28/2014   Procedure: HOLMIUM LASER APPLICATION;  Surgeon: Osborn Blaze, MD;  Location: [facility];  Service: Urology;   Laterality: Left;   LITHOTRIPSY  years ago   POLYPECTOMY  12/16/2019   Procedure: POLYPECTOMY;  Surgeon: Sergio Dandy, MD;  [facility];  Service: Endoscopy;;   STONE EXTRACTION WITH BASKET Left 06/28/2014   Procedure: STONE EXTRACTION WITH BASKET;  Surgeon: Osborn Blaze, MD;  [facility];  Service: Urology;  Laterality: Left;   Family History  Problem Relation Age of Onset   Diabetes Mother    Lung cancer Mother    CAD Father    Diabetes Brother    Diabetes Sister    Stroke Sister    Other Maternal Grandfather        brain tumor   Colon cancer Neg Hx    Pancreatic cancer Neg Hx    Esophageal cancer Neg Hx    Social History   Socioeconomic History   Marital status: Married    Spouse name: Monroe Antigua   Number of children: 1   Years of education: Not on file   Highest education level: Not on file  Occupational History   Occupation: retired   Tobacco Use   Smoking status: Never    Passive exposure: Never   Smokeless tobacco: Never  Vaping Use   Vaping status: Never Used  Substance and Sexual Activity   Alcohol use: No   Drug use: No   Sexual activity: Not Currently  Other Topics Concern   Not on file  Social History Narrative   Lives with wife - they have a basement but don't use it   One daughter - husband is a Programmer, multimedia and they go to his church   Patient is paraplegic - uses wheelchair, sometimes motorized   He does still drive using hand controls.   Social Drivers of Corporate investment banker Strain: Low Risk  (09/10/2023)   Overall Financial Resource Strain (CARDIA)    Difficulty of Paying Living Expenses: Not hard at all  Food Insecurity: No Food Insecurity (09/10/2023)   Hunger Vital Sign    Worried About Running Out of Food in the Last Year: Never true    Ran Out of Food in the Last Year: Never true  Transportation Needs: No Transportation Needs (09/10/2023)   PRAPARE - Administrator, Civil Service (Medical): No    Lack  of Transportation (Non-Medical): No  Physical Activity: Insufficiently Active (09/10/2023)   Exercise Vital Sign    Days of Exercise per Week: 3 days    Minutes of Exercise per Session: 10 min  Stress: No Stress Concern Present (09/10/2023)   [employer] of Occupational Health - Occupational Stress Questionnaire    Feeling of Stress : Not at all  Social Connections:  Socially Integrated (09/10/2023)   Social Connection and Isolation Panel [NHANES]    Frequency of Communication with Friends and Family: More than three times a week    Frequency of Social Gatherings with Friends and Family: More than three times a week    Attends Religious Services: More than 4 times per year    Active Member of [employer] or Organizations: Yes    Attends Engineer, structural: More than 4 times per year    Marital Status: Married    Tobacco Counseling Counseling given: Yes    Clinical Intake:  Pre-visit preparation completed: Yes  Pain : No/denies pain     BMI - recorded: 22.81 Nutritional Status: BMI of 19-24  Normal Nutritional Risks: None Diabetes: No  Lab Results  Component Value Date   HGBA1C 4.7 05/06/2020   HGBA1C 5.3 10/02/2015     How often do you need to have someone help you when you read instructions, pamphlets, or other written materials from your doctor or pharmacy?: 1 - Never  Interpreter Needed?: No  Information entered by :: Alia t/cma   Activities of Daily Living     09/10/2023    2:13 PM  In your present state of health, do you have any difficulty performing the following activities:  Hearing? 0  Vision? 0  Comment pt goes to [facility], [place]/last 5/25  Difficulty concentrating or making decisions? 0  Walking or climbing stairs? 1  Dressing or bathing? 1  Doing errands, shopping? 0  Preparing Food and eating ? N  Using the Toilet? N  In the past six months, have you accidently leaked urine? N  Do you have problems with loss of bowel control? N   Managing your Medications? N  Managing your Finances? N  Housekeeping or managing your Housekeeping? N    Patient Care Team: Roise Cleaver, MD as PCP - General (Family Medicine) Joline Ned as Consulting Physician (Neurosurgery) Secundino Dach Harvey Linen., MD as Consulting Physician (Urology) Delilah Fend, [facility] (Pharmacist) Alexia Idler, OD (Optometry)  I have updated your Care Teams any recent Medical Services you may have received from other providers in the past year.     Assessment:    This is a routine wellness examination for Theodore Mendoza.  Hearing/Vision screen Hearing Screening - Comments:: Pt denies hearing dif Vision Screening - Comments:: pt goes to [facility] [address], [place]/last 5/25   Goals Addressed             This Visit's Progress    Prevent falls   On track      Depression Screen     09/10/2023    2:20 PM 09/09/2023   11:04 AM 06/04/2023   11:10 AM 04/09/2023    2:33 PM 02/25/2023   11:13 AM 10/16/2022    9:55 AM 08/15/2022    8:42 AM  PHQ 2/9 Scores  PHQ - 2 Score 0 4 3 2  0 5 2  PHQ- 9 Score 0 13 12 7  21 9     Fall Risk     09/10/2023    4:33 PM 04/09/2023    2:33 PM 02/25/2023   11:13 AM 10/16/2022    9:41 AM 08/15/2022    8:36 AM  Fall Risk   Falls in the past year? 0 0 Exclusion - non ambulatory Exclusion - non ambulatory Exclusion - non ambulatory  Number falls in past yr: 0      Injury with Fall? 0      Risk  for fall due to : No Fall Risks No Fall Risks     Follow up Falls evaluation completed Falls evaluation completed       MEDICARE RISK AT HOME:  Medicare Risk at Home Any stairs in or around the home?: No If so, are there any without handrails?: No Home free of loose throw rugs in walkways, pet beds, electrical cords, etc?: Yes Adequate lighting in your home to reduce risk of falls?: Yes Life alert?: No Use of a cane, walker or w/c?: Yes (w/c) Grab bars in the bathroom?: No Shower chair or bench in shower?: Yes Elevated toilet seat or a  handicapped toilet?: No  TIMED UP AND GO:  Was the test performed?  no  Cognitive Function: 6CIT completed        09/10/2023    2:23 PM 06/08/2022   11:25 AM 05/23/2020    1:36 PM 05/19/2019   12:30 PM  6CIT Screen  What Year? 0 points 0 points 0 points 0 points  What month? 0 points 0 points 0 points 0 points  What time? 0 points 0 points 0 points 0 points  Count back from 20 0 points 0 points 0 points 0 points  Months in reverse 0 points 0 points 0 points 0 points  Repeat phrase 4 points 2 points 2 points 2 points  Total Score 4 points 2 points 2 points 2 points    Immunizations Immunization History  Administered Date(s) Administered   Pneumococcal Conjugate-13 01/15/2019   Pneumococcal Polysaccharide-23 02/03/2020    Screening Tests Health Maintenance  Topic Date Due   DTaP/Tdap/Td (1 - Tdap) 02/25/2024 (Originally 09/17/1959)   INFLUENZA VACCINE  11/01/2023   Medicare Annual Wellness (AWV)  09/09/2024   Pneumonia Vaccine 10+ Years old  Completed   HPV VACCINES  Aged Out   Meningococcal B Vaccine  Aged Out   COVID-19 Vaccine  Discontinued   Zoster Vaccines- Shingrix  Discontinued    Health Maintenance  There are no preventive care reminders to display for this patient.  Health Maintenance Items Addressed: See Nurse Notes at the end of this note  Additional Screening:  Vision Screening: Recommended annual ophthalmology exams for early detection of glaucoma and other disorders of the eye. Would you like a referral to an eye doctor? No    Dental Screening: Recommended annual dental exams for proper oral hygiene  Community Resource Referral / Chronic Care Management: CRR required this visit?  No   CCM required this visit?  No   Plan:    I have personally reviewed and noted the following in the patient's chart:   Medical and social history Use of alcohol, tobacco or illicit drugs  Current medications and supplements including opioid prescriptions.  Patient is not currently taking opioid prescriptions. Functional ability and status Nutritional status Physical activity Advanced directives List of other physicians Hospitalizations, surgeries, and ER visits in previous 12 months Vitals Screenings to include cognitive, depression, and falls Referrals and appointments  In addition, I have reviewed and discussed with patient certain preventive protocols, quality metrics, and best practice recommendations. A written personalized care plan for preventive services as well as general preventive health recommendations were provided to patient.   Michaelle Adolphus, CMA   09/10/2023   After Visit Summary: (Declined) Due to this being a telephonic visit, with patients personalized plan was offered to patient but patient Declined AVS at this time   Notes: Nothing significant to report at this time.

## 2023-09-10 NOTE — Patient Instructions (Signed)
 Mr. Theodore Mendoza , Thank you for taking time out of your busy schedule to complete your Annual Wellness Visit with me. I enjoyed our conversation and look forward to speaking with you again next year. I, as well as your care team,  appreciate your ongoing commitment to your health goals. Please review the following plan we discussed and let me know if I can assist you in the future. Your Game plan/ To Do List    Follow up Visits: Next Medicare AWV with our clinical staff: 09/10/24 at 2:30pm.    Next Office Visit with your provider: 12/11/23 at 10:55a.m.  Clinician Recommendations:  Aim for 30 minutes of exercise or brisk walking, 6-8 glasses of water , and 5 servings of fruits and vegetables each day. N/a      This is a list of the screening recommended for you and due dates:  Health Maintenance  Topic Date Due   DTaP/Tdap/Td vaccine (1 - Tdap) 02/25/2024*   Flu Shot  11/01/2023   Medicare Annual Wellness Visit  09/09/2024   Pneumonia Vaccine  Completed   HPV Vaccine  Aged Out   Meningitis B Vaccine  Aged Out   COVID-19 Vaccine  Discontinued   Zoster (Shingles) Vaccine  Discontinued  *Topic was postponed. The date shown is not the original due date.    Advanced directives: (Copy Requested) Please bring a copy of your health care power of attorney and living will to the office to be added to your chart at your convenience. You can mail to [facility] [address] [place], [place] [address] or email to [email or web address] Advance Care Planning is important because it:  [x]  Makes sure you receive the medical care that is consistent with your values, goals, and preferences  [x]  It provides guidance to your family and loved ones and reduces their decisional burden about whether or not they are making the right decisions based on your wishes.  Follow the link provided in your after visit summary or read over the paperwork we have mailed to you to help you started getting  your Advance Directives in place. If you need assistance in completing these, please reach out to us  so that we can help you!  See attachments for Preventive Care and Fall Prevention Tips.

## 2023-09-16 ENCOUNTER — Ambulatory Visit: Payer: Self-pay | Admitting: Family Medicine

## 2023-09-16 NOTE — Progress Notes (Signed)
 Hello Dyami,  Your lab result is normal and/or stable.Some minor variations that are not significant are commonly marked abnormal, but do not represent any medical problem for you.  Best regards, Mechele Claude, M.D.

## 2023-09-24 ENCOUNTER — Ambulatory Visit: Admitting: Family Medicine

## 2023-09-24 ENCOUNTER — Encounter: Payer: Self-pay | Admitting: Family Medicine

## 2023-09-24 VITALS — BP 123/64 | HR 63 | Temp 97.5°F | Ht 68.0 in

## 2023-09-24 DIAGNOSIS — D329 Benign neoplasm of meninges, unspecified: Secondary | ICD-10-CM

## 2023-09-24 DIAGNOSIS — I10 Essential (primary) hypertension: Secondary | ICD-10-CM | POA: Diagnosis not present

## 2023-09-24 DIAGNOSIS — Z8669 Personal history of other diseases of the nervous system and sense organs: Secondary | ICD-10-CM

## 2023-09-24 DIAGNOSIS — G8222 Paraplegia, incomplete: Secondary | ICD-10-CM

## 2023-09-24 DIAGNOSIS — I4819 Other persistent atrial fibrillation: Secondary | ICD-10-CM

## 2023-09-24 DIAGNOSIS — Z7689 Persons encountering health services in other specified circumstances: Secondary | ICD-10-CM

## 2023-09-24 MED ORDER — AMLODIPINE BESYLATE 5 MG PO TABS: 5.0000 mg | ORAL_TABLET | Freq: Every day | ORAL | 0 refills | Status: DC

## 2023-09-24 NOTE — Progress Notes (Signed)
 Subjective:  Patient ID: Theodore Mendoza, male    DOB: 12-01-1940  Age: 83 y.o. MRN: [id number]  CC: Order (Order for power wheelchair/Paperwork already given)   HPI Theodore Mendoza presents for power wheelchair consult. Uses power wheelchair when necessary to get around.  He has used a manual wheelchair at times but the strength in his hands is minimal and he cannot grip he has to push the wheel bar that propels the chair with the heel of his hand using friction rather than grip.  [name] of his Guillain-Barr syndrome.  This occurred in 1987 and he has been wheelchair-bound ever since.  This is due to complete paraplegia from the waist down.  He cannot move his legs they have no strength.  He is unable to ambulate even single steps.  He is able to make transfers using his upper arms to pull himself into his bed or onto a commode.  He uses rails on the toilet and another set of rails from a shower chair along with the rails of his wheelchair to perform a rather complex transfer over to a toilet.  He notes that a standard wheelchair does not support this when he is trying to balance between the various appliances.  However the heavier weight of the powered device allows for him to transfer more easily.  He can move about the house somewhat but it is painful to his hands, and it is impractical because he cannot grip when using a manual wheelchair.  He cannot use a walker or cane simply because he has absolutely no leg strength with which to stand.  His progression over time has been that as he ages his upper body strength has declined and his coordination has decreased.  This affect his hands and his grip tremendously.  The Guillain-Barr is overall stable and no medications have been found that we will address his situation.  Due to a heart condition he takes multiple medications including Tikosyn  as well as furosemide .  Of course the furosemide  causes increased urination  which increases his number of trips to the bathroom which makes transfers much more frustrating when using the less stable wheelchair device.  The patient cannot walk at all.  His pace of ambulation is 0.  He has not fallen with the exception of on a few occasions when making a transfer he has slipped off of the bed or the commode/toilet.  Currently he has an older model power wheelchair.  It is 85 years old.  He bought it in a garage sale back then.  He has taken it to be repaired and there is no availability of parts for it according to the repair service.  He tried buying a new battery for it on his own that did not remedy the situation.  He can use a manual wheelchair in a limited fashion.  The limitation is due to upper body strength being poor and his inability to grip the control bar.  Again, he has to use the heels of his hands and friction to push it forward.  This leads to it veering to 1 side or the other when his h hands do not create equal friction bilaterally.  He actually gets short of breath when he maneuvers the manual wheelchair as well.  His home setting is single floor it is a fairly standard home on 1 level and the doorways are wide enough for a power operated vehicle.  He cannot  use a scooter simply because he has no balance.  He would fall off of the scooter immediately.     09/24/2023   11:04 AM 09/10/2023    2:20 PM 09/09/2023   11:04 AM  Depression screen PHQ 2/9  Decreased Interest 1 0 2  Down, Depressed, Hopeless 1 0 2  PHQ - 2 Score 2 0 4  Altered sleeping 0 0 3  Tired, decreased energy 3 0 3  Change in appetite 0 0 0  Feeling bad or failure about yourself  2 0 2  Trouble concentrating 0 0 1  Moving slowly or fidgety/restless 0 0 0  Suicidal thoughts 0 0 0  PHQ-9 Score 7 0 13  Difficult doing work/chores Very difficult      History Theodore Mendoza has a past medical history of AAA (abdominal aortic aneurysm) (HCC), Atrial fibrillation (HCC), Brain tumor (HCC), Cancer (HCC),  Cardiac arrhythmia, CHF (congestive heart failure) (HCC), GERD (gastroesophageal reflux disease), Guillain-Barre syndrome (HCC) (02/13/1986), Headache, Hypertension, Kidney stones, and Myocardial infarction (HCC) (2007).   He has a past surgical history that includes Cystoscopy with stent placement (Left, 05/08/2014); Coronary angioplasty (1997); Eye surgery (Right, may 2015); gamma kniferadiation treatment (Feb 09 2014); Lithotripsy (years ago); Cystoscopy with retrograde pyelogram, ureteroscopy and stent placement (Left, 06/28/2014); Holmium laser application (Left, 06/28/2014); Stone extraction with basket (Left, 06/28/2014); Esophagogastroduodenoscopy (egd) with propofol  (N/A, 12/16/2019); Hemostasis clip placement (12/16/2019); Hemostasis control (12/16/2019); polypectomy (12/16/2019); Esophagogastroduodenoscopy (egd) with propofol  (N/A, 08/13/2020); Colonoscopy with propofol  (N/A, 08/14/2020); Esophagogastroduodenoscopy (egd) with propofol  (N/A, 09/16/2020); enteroscopy (N/A, 09/16/2020); Givens capsule study (N/A, 10/11/2020); Esophagogastroduodenoscopy (egd) with propofol  (N/A, 10/25/2020); enteroscopy (N/A, 10/25/2020); and Abdominal aortic endovascular stent graft (Bilateral, 04/04/2022).   His family history includes CAD in his father; Diabetes in his brother, mother, and sister; Lung cancer in his mother; Other in his maternal grandfather; Stroke in his sister.He reports that he has never smoked. He has never been exposed to tobacco smoke. He has never used smokeless tobacco. He reports that he does not drink alcohol and does not use drugs.    ROS Review of Systems  Constitutional: Negative.   HENT: Negative.    Eyes:  Negative for visual disturbance.  Respiratory:  Negative for cough and shortness of breath.   Cardiovascular:  Negative for chest pain and leg swelling.  Gastrointestinal:  Negative for abdominal pain, diarrhea, nausea and vomiting.  Genitourinary:  Negative for difficulty urinating.   Musculoskeletal:  Positive for gait problem (nonambulatory.). Negative for arthralgias and myalgias.  Skin:  Negative for rash.  Neurological:  Negative for headaches.  Psychiatric/Behavioral:  Negative for sleep disturbance.     Objective:  BP 123/64   Pulse 63   Temp (!) 97.5 F (36.4 C)   Ht 5' 8 (1.727 m)   SpO2 96%   BMI 22.81 kg/m   BP Readings from Last 3 Encounters:  09/24/23 123/64  09/10/23 137/63  09/09/23 137/63    Wt Readings from Last 3 Encounters:  05/27/23 150 lb (68 kg)  04/02/23 150 lb (68 kg)  03/31/23 149 lb 14.6 oz (68 kg)     Physical Exam Vitals reviewed.  Constitutional:      Appearance: He is well-developed.  HENT:     Head: Normocephalic and atraumatic.     Right Ear: External ear normal.     Left Ear: External ear normal.     Mouth/Throat:     Pharynx: No oropharyngeal exudate or posterior oropharyngeal erythema.   Eyes:  Pupils: Pupils are equal, round, and reactive to light.    Cardiovascular:     Rate and Rhythm: Normal rate and regular rhythm.     Heart sounds: Murmur heard.  Pulmonary:     Effort: No respiratory distress.     Breath sounds: Normal breath sounds.   Musculoskeletal:        General: Deformity (wasting of BLE. WC bound. Hands are weak, cannot close fist. Grip strenght is 2/5 wrists cannot flex are fixed in 45 degrees of extension.) present.     Cervical back: Normal range of motion and neck supple.   Neurological:     Mental Status: He is alert and oriented to person, place, and time.      Assessment & Plan:  Encounter for power mobility device assessment  Essential (primary) hypertension -     amLODIPine  Besylate; Take 1 tablet (5 mg total) by mouth daily.  Dispense: 90 tablet; Refill: 0  Paraplegia, incomplete (HCC)  History of Guillain-Barre syndrome  Persistent atrial fibrillation (HCC)     Follow-up: No follow-ups on file.  Butler Der, M.D.

## 2023-09-25 NOTE — Progress Notes (Signed)
 Order & Ov notes faxed to [employer] at [phone]

## 2023-10-08 ENCOUNTER — Ambulatory Visit: Payer: Self-pay

## 2023-10-08 ENCOUNTER — Encounter: Payer: Self-pay | Admitting: Nurse Practitioner

## 2023-10-08 ENCOUNTER — Ambulatory Visit (INDEPENDENT_AMBULATORY_CARE_PROVIDER_SITE_OTHER): Admitting: Nurse Practitioner

## 2023-10-08 VITALS — BP 160/67 | HR 66 | Temp 98.3°F | Ht 68.0 in

## 2023-10-08 DIAGNOSIS — R110 Nausea: Secondary | ICD-10-CM | POA: Insufficient documentation

## 2023-10-08 DIAGNOSIS — R197 Diarrhea, unspecified: Secondary | ICD-10-CM | POA: Diagnosis not present

## 2023-10-08 DIAGNOSIS — R112 Nausea with vomiting, unspecified: Secondary | ICD-10-CM | POA: Diagnosis not present

## 2023-10-08 MED ORDER — LOPERAMIDE HCL 2 MG PO TABS: 2.0000 mg | ORAL_TABLET | Freq: Four times a day (QID) | ORAL | 0 refills | Status: AC | PRN

## 2023-10-08 MED ORDER — ONDANSETRON 4 MG PO TBDP: 4.0000 mg | ORAL_TABLET | Freq: Three times a day (TID) | ORAL | 0 refills | Status: DC | PRN

## 2023-10-08 NOTE — Progress Notes (Addendum)
 Acute Office Visit  Subjective:     Patient ID: Theodore Mendoza, male    DOB: 1940-09-21, 83 y.o.   MRN: [id number]  Chief Complaint  Patient presents with   Diarrhea    Symptoms started yesterday    Nausea    HPI Theodore Mendoza is an 83 year old male who presents with his wife on 10/08/2023 for an acute visit due to complaints of nausea and diarrhea. The patient reports that symptoms began on Monday, 10/07/2023, shortly after attending a church event where he consumed hamburgers. He is uncertain whether the food may have contributed to his symptoms. He describes experiencing approximately four episodes of loose, non-bloody diarrhea along with persistent nausea. He denies vomiting, fever, chills, abdominal pain, or recent travel. No other household members are currently ill.  Active Ambulatory Problems    Diagnosis Date Noted   Sepsis (HCC) 05/07/2014   Essential (primary) hypertension 05/07/2014   Hyperlipidemia 05/08/2014   History of TIA (transient ischemic attack) 10/01/2015   Idiopathic peripheral neuropathy 06/26/2017   Meningioma (HCC) 12/13/2011   Paraplegia, incomplete (HCC) 10/16/2018   Persistent atrial fibrillation (HCC) 04/21/2019   History of non-ST elevation myocardial infarction (NSTEMI) 01/17/2011   Long term current use of anticoagulant 01/20/2020   Severe episode of recurrent major depressive disorder, without psychotic features (HCC) 11/01/2020   Gastroesophageal reflux disease 10/02/2021   Patient cannot afford medications 10/02/2021   Iron  deficiency anemia 03/08/2022   Status post abdominal aortic aneurysm (AAA) repair 04/04/2022   Abdominal aortic aneurysm (AAA) greater than 5.5 cm in diameter in male (HCC) 04/04/2022   UTI (urinary tract infection) 07/31/2022   Acute hypoxic respiratory failure (HCC) 08/01/2022   Chronic coronary artery disease 02/03/2015   History of Guillain-Barre syndrome 01/17/2011   Status post gamma knife treatment 10/29/2022    Diarrhea 10/08/2023   Nausea and vomiting in adult 10/08/2023   Resolved Ambulatory Problems    Diagnosis Date Noted   Pyelonephritis 05/07/2014   AKI (acute kidney injury) (HCC) 05/07/2014   Hydronephrosis with urinary obstruction due to renal calculus    Blood poisoning    GBS (Guillain Barre syndrome) (HCC) 02/26/2017   Late effects of CVA (cerebrovascular accident) 11/16/2015   Major depressive disorder with single episode, in full remission (HCC) 11/16/2015   Symptomatic anemia 12/14/2019   Gastrointestinal bleeding 12/14/2019   Abdominal aortic aneurysm (AAA) without rupture (HCC)    Gastric polyp    Generalized edema 12/28/2019   Hospital discharge follow-up 12/28/2019   Fall 08/08/2020   Rectal bleeding 08/11/2020   Acute blood loss anemia 08/11/2020   Acute lower UTI 08/12/2020   Guillain Barr syndrome (HCC) 08/12/2020   Generalized anxiety disorder 11/01/2020   Past Medical History:  Diagnosis Date   AAA (abdominal aortic aneurysm) (HCC)    Atrial fibrillation (HCC)    Brain tumor (HCC)    Cancer (HCC)    Cardiac arrhythmia    CHF (congestive heart failure) (HCC)    GERD (gastroesophageal reflux disease)    Guillain-Barre syndrome (HCC) 02/13/1986   Headache    Hypertension    Kidney stones    Myocardial infarction [facility]) 2007    Review of Systems  Constitutional:  Negative for fever.  HENT:  Negative for congestion and sore throat.   Respiratory:  Negative for cough and shortness of breath.   Gastrointestinal:  Positive for diarrhea and nausea. Negative for vomiting.       For episode of loose stool today  Skin:  Negative for rash.  Neurological:  Negative for dizziness.   Negative unless indicated in HPI    Objective:    BP (!) 160/67   Pulse 66   Temp 98.3 F (36.8 C) (Temporal)   Ht 5' 8 (1.727 m)   SpO2 96%   BMI 22.81 kg/m  BP Readings from Last 3 Encounters:  10/08/23 (!) 160/67  09/24/23 123/64  09/10/23 137/63   Wt Readings from  Last 3 Encounters:  05/27/23 150 lb (68 kg)  04/02/23 150 lb (68 kg)  03/31/23 149 lb 14.6 oz (68 kg)      Physical Exam Vitals and nursing note reviewed.  Constitutional:      General: He is not in acute distress. HENT:     Head: Normocephalic and atraumatic.     Nose: Nose normal.     Mouth/Throat:     Mouth: Mucous membranes are moist.  Eyes:     General: No scleral icterus.    Extraocular Movements: Extraocular movements intact.     Conjunctiva/sclera: Conjunctivae normal.     Pupils: Pupils are equal, round, and reactive to light.  Abdominal:     General: Bowel sounds are normal.     Palpations: Abdomen is soft.     Tenderness: There is no abdominal tenderness.  Musculoskeletal:        General: Normal range of motion.     Right lower leg: No edema.     Left lower leg: No edema.  Skin:    General: Skin is warm and dry.     Capillary Refill: Capillary refill takes less than 2 seconds.     Findings: No rash.  Neurological:     Mental Status: He is alert and oriented to person, place, and time.     Comments: Use a wheelchair for ambulation  Psychiatric:        Mood and Affect: Mood normal.        Behavior: Behavior normal.        Thought Content: Thought content normal.        Judgment: Judgment normal.     No results found for any visits on 10/08/23.      Assessment & Plan:  Nausea and vomiting in adult  Diarrhea, unspecified type -     Loperamide  HCl; Take 1 tablet (2 mg total) by mouth 4 (four) times daily as needed for diarrhea or loose stools.  Dispense: 30 tablet; Refill: 0  Other orders -     Ondansetron ; Take 1 tablet (4 mg total) by mouth every 8 (eight) hours as needed for nausea or vomiting.  Dispense: 12 tablet; Refill: 0   Theodore Mendoza is a 83 year old Caucasian male seen today for diarrhea/nausea, no acute distress Zofran  as needed every 8 hours Imodium  4 times a day as needed Bland diet, increase hydration  The above assessment and management  plan was discussed with the patient. The patient verbalized understanding of and has agreed to the management plan. Patient is aware to call the clinic if they develop any new symptoms or if symptoms persist or worsen. Patient is aware when to return to the clinic for a follow-up visit. Patient educated on when it is appropriate to go to the emergency department.  Return if symptoms worsen or fail to improve.  Crystal Scarberry [place] Thompson, DNP [facility] [address] [place], [place] [address] [phone]  Note: This document was prepared by [employer] voice dictation technology and any errors that  results from this process are unintentional.

## 2023-10-08 NOTE — Telephone Encounter (Signed)
 FYI Only or Action Required?: FYI only for provider.  Patient was last seen in primary care on 09/24/2023 by Zollie Lowers, MD.  Called Nurse Triage reporting Diarrhea.  Symptoms began yesterday.  Interventions attempted: Nothing.  Symptoms are: gradually worsening.  Triage Disposition: See Physician Within 24 Hours  Patient/caregiver understands and will follow disposition?: Yes   You Copied from CRM [id number]. Topic: Clinical - Red Word Triage >> Oct 08, 2023  9:33 AM Theodore Mendoza wrote: Red Word that prompted transfer to Nurse Triage: nausea. Vomiting all night long. Diarrhea. Reason for Disposition  [1] MODERATE diarrhea (e.Mendoza., 4-6 times / day more than normal) AND [2] present > 48 hours (2 days)  Answer Assessment - Initial Assessment Questions 1. DIARRHEA SEVERITY: How bad is the diarrhea? How many more stools have you had in the past 24 hours than normal?    - NO DIARRHEA (SCALE 0)   - MILD (SCALE 1-3): Few loose or mushy BMs; increase of 1-3 stools over normal daily number of stools; mild increase in ostomy output.   -  MODERATE (SCALE 4-7): Increase of 4-6 stools daily over normal; moderate increase in ostomy output.   -  SEVERE (SCALE 8-10; OR WORST POSSIBLE): Increase of 7 or more stools daily over normal; moderate increase in ostomy output; incontinence.     moderate 2. ONSET: When did the diarrhea begin?      yesterday 3. BM CONSISTENCY: How loose or watery is the diarrhea?      Watery, loose 4. VOMITING: Are you also vomiting? If Yes, ask: How many times in the past 24 hours?      no 5. ABDOMEN PAIN: Are you having any abdomen pain? If Yes, ask: What does it feel like? (e.Mendoza., crampy, dull, intermittent, constant)      no 6. ABDOMEN PAIN SEVERITY: If present, ask: How bad is the pain?  (e.Mendoza., Scale 1-10; mild, moderate, or severe)   - MILD (1-3): doesn't interfere with normal activities, abdomen soft and not tender to touch    - MODERATE (4-7):  interferes with normal activities or awakens from sleep, abdomen tender to touch    - SEVERE (8-10): excruciating pain, doubled over, unable to do any normal activities       na 7. ORAL INTAKE: If vomiting, Have you been able to drink liquids? How much liquids have you had in the past 24 hours?     No - not drinking a lot due to nausea 8. HYDRATION: Any signs of dehydration? (e.Mendoza., dry mouth [not just dry lips], too weak to stand, dizziness, new weight loss) When did you last urinate?     na 9. EXPOSURE: Have you traveled to a foreign country recently? Have you been exposed to anyone with diarrhea? Could you have eaten any food that was spoiled?     na 10. ANTIBIOTIC USE: Are you taking antibiotics now or have you taken antibiotics in the past 2 months?       no 11. OTHER SYMPTOMS: Do you have any other symptoms? (e.Mendoza., fever, blood in stool)       no 12. PREGNANCY: Is there any chance you are pregnant? When was your last menstrual period?       na  Protocols used: [facility]

## 2023-10-29 ENCOUNTER — Encounter (HOSPITAL_COMMUNITY): Payer: Self-pay | Admitting: Occupational Therapy

## 2023-10-29 ENCOUNTER — Ambulatory Visit (HOSPITAL_COMMUNITY): Attending: Family Medicine | Admitting: Occupational Therapy

## 2023-10-29 DIAGNOSIS — G610 Guillain-Barre syndrome: Secondary | ICD-10-CM | POA: Diagnosis not present

## 2023-10-29 DIAGNOSIS — R29818 Other symptoms and signs involving the nervous system: Secondary | ICD-10-CM | POA: Diagnosis not present

## 2023-10-29 DIAGNOSIS — Z993 Dependence on wheelchair: Secondary | ICD-10-CM | POA: Diagnosis not present

## 2023-10-29 NOTE — Therapy (Signed)
 [facility] at [facility] [address] [place], [place], [address] Phone: [phone]   Fax:  [phone]  Occupational Therapy Evaluation  Patient Details  Name: Theodore Mendoza MRN: [id number] Date of Birth: Jun 05, 1940 No data recorded  Encounter Date: 10/29/2023   OT End of Session - 10/29/23 1602     Visit Number 1    Number of Visits 1    Date for OT Re-Evaluation 10/30/23    Authorization Type BCBS Medicare    OT Start Time 1305    OT Stop Time 1353    OT Time Calculation (min) 48 min    Activity Tolerance Patient tolerated treatment well    Behavior During Therapy [facility] for tasks assessed/performed          Past Medical History:  Diagnosis Date   AAA (abdominal aortic aneurysm) (HCC)    Atrial fibrillation (HCC)    Brain tumor (HCC)    x2 (benign per pt)   Cancer (HCC)    melanoma removed twice (head and right forearm)   Cardiac arrhythmia    CHF (congestive heart failure) (HCC)    GERD (gastroesophageal reflux disease)    Guillain-Barre syndrome (HCC) 02/13/1986   Headache    Hypertension    Kidney stones    Myocardial infarction [facility]) 2007    Past Surgical History:  Procedure Laterality Date   ABDOMINAL AORTIC ENDOVASCULAR STENT GRAFT Bilateral 04/04/2022   Procedure: ABDOMINAL AORTIC ENDOVASCULAR STENT GRAFT;  Surgeon: Serene Gaile ORN, MD;  Location: [facility] OR;  Service: Vascular;  Laterality: Bilateral;   COLONOSCOPY WITH PROPOFOL  N/A 08/14/2020   Procedure: COLONOSCOPY WITH PROPOFOL ;  Surgeon: Golda Claudis PENNER, MD;  Location: [facility] END[facility];  Service: Endoscopy;  Laterality: N/A;   CORONARY ANGIOPLASTY  1997   after mi   CYSTOSCOPY WITH RETROGRADE PYELOGRAM, URETEROSCOPY AND STENT PLACEMENT Left 06/28/2014   Procedure: 1ST STAGE CYSTOSCOPY/URETEROSCOPY/STENT PLACEMENT;  Surgeon: Ricardo Likens, MD;  Location: [facility] ORS;  Service: Urology;  Laterality: Left;   CYSTOSCOPY WITH STENT PLACEMENT Left 05/08/2014   Procedure:  CYSTOSCOPY, RETROGRADE PYELOGRAM WITH LEFT URETERAL STENT PLACEMENT;  Surgeon: Ricardo Likens, MD;  Location: [facility] ORS;  Service: Urology;  Laterality: Left;   ENTEROSCOPY N/A 09/16/2020   Procedure: PUSH ENTEROSCOPY;  Surgeon: Eartha Angelia Sieving, MD;  Location: [facility];  Service: Gastroenterology;  Laterality: N/A;   ENTEROSCOPY N/A 10/25/2020   Procedure: PUSH ENTEROSCOPY;  Surgeon: Eartha Angelia Sieving, MD;  [facility]: [facility];  Service: Gastroenterology;  Laterality: N/A;   ESOPHAGOGASTRODUODENOSCOPY (EGD) WITH PROPOFOL  N/A 12/16/2019   Procedure: ESOPHAGOGASTRODUODENOSCOPY (EGD) WITH PROPOFOL ;  Surgeon: Shila Gustav GAILS, MD;  Location: [facility];  Service: Endoscopy;  Laterality: N/A;   ESOPHAGOGASTRODUODENOSCOPY (EGD) WITH PROPOFOL  N/A 08/13/2020   Procedure: ESOPHAGOGASTRODUODENOSCOPY (EGD) WITH PROPOFOL ;  Surgeon: Golda Claudis PENNER, MD;  [facility];  Service: Endoscopy;  Laterality: N/A;   ESOPHAGOGASTRODUODENOSCOPY (EGD) WITH PROPOFOL  N/A 09/16/2020   Procedure: ESOPHAGOGASTRODUODENOSCOPY (EGD) WITH PROPOFOL ;  Surgeon: Eartha Angelia Sieving, MD;  [facility];  Service: Gastroenterology;  Laterality: N/A;  1:55   ESOPHAGOGASTRODUODENOSCOPY (EGD) WITH PROPOFOL  N/A 10/25/2020   Procedure: ESOPHAGOGASTRODUODENOSCOPY (EGD) WITH PROPOFOL ;  Surgeon: Eartha Angelia Sieving, MD;  [facility];  Service: Gastroenterology;  Laterality: N/A;  2:00   EYE SURGERY Right may 2015   growth removed, july 2015left eye cataract removed, right eye catarct removed   gamma kniferadiation treatment  Feb 09 2014   [facility] for [facility] tumor  GIVENS CAPSULE STUDY N/A 10/11/2020   Procedure: GIVENS CAPSULE STUDY;  Surgeon: Eartha Angelia Sieving, MD;  [facility];  Service: Gastroenterology;  Laterality: N/A;  7:30   HEMOSTASIS CLIP PLACEMENT  12/16/2019   Procedure: HEMOSTASIS CLIP PLACEMENT;  Surgeon: Shila Gustav GAILS, MD;  [facility]  END[facility];  Service: Endoscopy;;   HEMOSTASIS CONTROL  12/16/2019   Procedure: HEMOSTASIS CONTROL;  Surgeon: Shila Gustav GAILS, MD;  [facility];  Service: Endoscopy;;  Endoloop   HOLMIUM LASER APPLICATION Left 06/28/2014   Procedure: HOLMIUM LASER APPLICATION;  Surgeon: Ricardo Likens, MD;  [facility]: [facility];  Service: Urology;  Laterality: Left;   LITHOTRIPSY  years ago   POLYPECTOMY  12/16/2019   Procedure: POLYPECTOMY;  Surgeon: Shila Gustav GAILS, MD;  [facility];  Service: Endoscopy;;   STONE EXTRACTION WITH BASKET Left 06/28/2014   Procedure: STONE EXTRACTION WITH BASKET;  Surgeon: Ricardo Likens, MD;  Location: [facility];  Service: Urology;  Laterality: Left;    Visit Diagnosis: Guillain Barr syndrome [facility])  Wheelchair dependent  Other symptoms and signs involving the nervous system    Problem List Patient Active Problem List   Diagnosis Date Noted   Diarrhea 10/08/2023   Nausea and vomiting in adult 10/08/2023   Status post gamma knife treatment 10/29/2022   Acute hypoxic respiratory failure (HCC) 08/01/2022   UTI (urinary tract infection) 07/31/2022   Status post abdominal aortic aneurysm (AAA) repair 04/04/2022   Abdominal aortic aneurysm (AAA) greater than 5.5 cm in diameter in male (HCC) 04/04/2022   Iron  deficiency anemia 03/08/2022   Gastroesophageal reflux disease 10/02/2021   Patient cannot afford medications 10/02/2021   Severe episode of recurrent major depressive disorder, without psychotic features (HCC) 11/01/2020   Long term current use of anticoagulant 01/20/2020   Persistent atrial fibrillation (HCC) 04/21/2019   Paraplegia, incomplete (HCC) 10/16/2018   Idiopathic peripheral neuropathy 06/26/2017   History of TIA (transient ischemic attack) 10/01/2015   Chronic coronary artery disease 02/03/2015   Hyperlipidemia 05/08/2014   Sepsis (HCC) 05/07/2014   Essential (primary) hypertension 05/07/2014   Meningioma (HCC) 12/13/2011    History of non-ST elevation myocardial infarction (NSTEMI) 01/17/2011   History of Guillain-Barre syndrome 01/17/2011   Pt arrived to Occupational Therapy with his wife. He presented in a manual wheelchair being pushed by his wife. Pt completed Wheel Chair Evaluation which is faxed to [employer] for completion and posted in patients chart.    Valentin Nightingale, OTR/L [facility] [phone], OT 10/29/2023, 4:03 PM  [facility] at [facility] [address] [place], [place], [address] Phone: [phone]   Fax:  [phone]  Name: KAYDON HUSBY MRN: [id number] Date of Birth: 1940/04/24

## 2023-10-30 DIAGNOSIS — D6869 Other thrombophilia: Secondary | ICD-10-CM | POA: Diagnosis not present

## 2023-10-30 DIAGNOSIS — F419 Anxiety disorder, unspecified: Secondary | ICD-10-CM | POA: Diagnosis not present

## 2023-11-03 ENCOUNTER — Other Ambulatory Visit: Payer: Self-pay

## 2023-11-03 ENCOUNTER — Emergency Department (HOSPITAL_COMMUNITY)

## 2023-11-03 ENCOUNTER — Emergency Department (HOSPITAL_COMMUNITY)
Admission: EM | Admit: 2023-11-03 | Discharge: 2023-11-03 | Disposition: A | Discharge status: Home / Self Care | Attending: Emergency Medicine | Admitting: Emergency Medicine

## 2023-11-03 DIAGNOSIS — M778 Other enthesopathies, not elsewhere classified: Secondary | ICD-10-CM | POA: Diagnosis not present

## 2023-11-03 DIAGNOSIS — Z7901 Long term (current) use of anticoagulants: Secondary | ICD-10-CM | POA: Insufficient documentation

## 2023-11-03 DIAGNOSIS — S0993XA Unspecified injury of face, initial encounter: Secondary | ICD-10-CM | POA: Diagnosis not present

## 2023-11-03 DIAGNOSIS — S51012A Laceration without foreign body of left elbow, initial encounter: Secondary | ICD-10-CM | POA: Insufficient documentation

## 2023-11-03 DIAGNOSIS — M25519 Pain in unspecified shoulder: Secondary | ICD-10-CM | POA: Diagnosis not present

## 2023-11-03 DIAGNOSIS — I700 Atherosclerosis of aorta: Secondary | ICD-10-CM | POA: Diagnosis not present

## 2023-11-03 DIAGNOSIS — Z23 Encounter for immunization: Secondary | ICD-10-CM | POA: Insufficient documentation

## 2023-11-03 DIAGNOSIS — Z993 Dependence on wheelchair: Secondary | ICD-10-CM | POA: Insufficient documentation

## 2023-11-03 DIAGNOSIS — W19XXXA Unspecified fall, initial encounter: Secondary | ICD-10-CM | POA: Diagnosis not present

## 2023-11-03 DIAGNOSIS — S59902A Unspecified injury of left elbow, initial encounter: Secondary | ICD-10-CM | POA: Diagnosis not present

## 2023-11-03 DIAGNOSIS — I672 Cerebral atherosclerosis: Secondary | ICD-10-CM | POA: Insufficient documentation

## 2023-11-03 DIAGNOSIS — Z043 Encounter for examination and observation following other accident: Secondary | ICD-10-CM | POA: Diagnosis not present

## 2023-11-03 DIAGNOSIS — M25522 Pain in left elbow: Secondary | ICD-10-CM | POA: Diagnosis not present

## 2023-11-03 DIAGNOSIS — R58 Hemorrhage, not elsewhere classified: Secondary | ICD-10-CM | POA: Diagnosis not present

## 2023-11-03 DIAGNOSIS — S199XXA Unspecified injury of neck, initial encounter: Secondary | ICD-10-CM | POA: Diagnosis not present

## 2023-11-03 DIAGNOSIS — W050XXA Fall from non-moving wheelchair, initial encounter: Secondary | ICD-10-CM | POA: Insufficient documentation

## 2023-11-03 DIAGNOSIS — R220 Localized swelling, mass and lump, head: Secondary | ICD-10-CM | POA: Insufficient documentation

## 2023-11-03 MED ORDER — TETANUS-DIPHTH-ACELL PERTUSSIS 5-2.5-18.5 LF-MCG/0.5 IM SUSY
0.5000 mL | PREFILLED_SYRINGE | Freq: Once | INTRAMUSCULAR | Status: AC
  Administered 2023-11-03: 0.5 mL via INTRAMUSCULAR
  Filled 2023-11-03: qty 0.5

## 2023-11-03 NOTE — ED Provider Notes (Signed)
 I provided a substantive portion of the care of this patient.  I personally made/approved the management plan for this patient and take responsibility for the patient management.   83 year old patient who is on Eliquis  who had mechanical fall from his wheelchair.  Did not strike his head.  Does have a avulsion injury to his left elbow.  Will perform x-rays and reassess   Theodore Faden, MD 11/03/23 1223

## 2023-11-03 NOTE — Discharge Instructions (Signed)
 Your images did not show any acute finding on today's visit.  You may continue to change the dressings to your left elbow.  You may also apply Neosporin to help with healing.  Alternate ibuprofen versus Tylenol  to help with pain control.

## 2023-11-03 NOTE — ED Notes (Signed)
 XR at bedside
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
## 2023-11-03 NOTE — ED Notes (Addendum)
 Patient dc by RN with legal guardian daughter per chart and wife. In wheelchair to lobby.

## 2023-11-14 ENCOUNTER — Telehealth: Payer: Self-pay | Admitting: Family Medicine

## 2023-11-14 NOTE — Telephone Encounter (Signed)
 Copied from CRM #[id number]. Topic: General - Other >> Nov 14, 2023  1:32 PM Wess RAMAN wrote: Reason for CRM: Marisol from [employer] would like to know if the faxed documents for patient's power wheelchair were received. They were faxed on 11/08/23.  Callback: [phone] Fax: [phone]

## 2023-11-14 NOTE — Telephone Encounter (Signed)
 Paperwork has been received and on PCP's desk.

## 2023-11-25 ENCOUNTER — Ambulatory Visit (HOSPITAL_COMMUNITY)
Admission: RE | Admit: 2023-11-25 | Discharge: 2023-11-25 | Disposition: A | Discharge status: Home / Self Care | Payer: Medicare Other | Source: Ambulatory Visit | Attending: Surgery | Admitting: Surgery

## 2023-11-25 ENCOUNTER — Other Ambulatory Visit: Payer: Self-pay

## 2023-11-25 ENCOUNTER — Ambulatory Visit: Payer: Medicare Other | Attending: Surgery | Admitting: Physician Assistant

## 2023-11-25 VITALS — BP 150/66 | HR 50 | Temp 98.2°F | Wt 149.0 lb

## 2023-11-25 DIAGNOSIS — I9789 Other postprocedural complications and disorders of the circulatory system, not elsewhere classified: Secondary | ICD-10-CM

## 2023-11-25 DIAGNOSIS — I7143 Infrarenal abdominal aortic aneurysm, without rupture: Secondary | ICD-10-CM | POA: Insufficient documentation

## 2023-11-25 NOTE — Progress Notes (Signed)
 Office Note     CC:  follow up Requesting Provider:  Zollie Lowers, MD  HPI: Theodore Mendoza is a 83 y.o. ([birth year]) male who presents for surveillance follow up of EVAR. This was performed by Dr. Serene in January 2024 due to 5.3 cm infrarenal abdominal aortic aneurysm. On his follow up CTA he was noted to have a type II endoleak. He has been without any concerning abdominal pain or back pain.   Today he is here with is wife of 65 years, Theodore Mendoza. He says overall he has been doing well. He denies any pain in his back or abdomen. He denies any pain in his legs. He did just fall down the stairs in his wheel chair at church a couple weekends ago so his left side is pretty beat up and bruised but he reports that he was taken to ER and did not have any major injuries. He is still healing from this. He reports a lot of left upper arm soreness. He additionally mentions swelling in his legs. He does try to elevate his legs when at home, which helps. He however does spend a good bit of time in his wheel chair with his legs in dependent position.   The pt is a statin for cholesterol management.    The pt is not on an aspirin .    Other AC:  Eliquis  for atrial fibrillation The pt is on ACE, CCB for hypertension.  The pt does not have diabetes. Tobacco hx:  never  Past Medical History:  Diagnosis Date   AAA (abdominal aortic aneurysm) (HCC)    Atrial fibrillation (HCC)    Brain tumor (HCC)    x2 (benign per pt)   Cancer (HCC)    melanoma removed twice (head and right forearm)   Cardiac arrhythmia    CHF (congestive heart failure) (HCC)    GERD (gastroesophageal reflux disease)    Guillain-Barre syndrome (HCC) 02/13/1986   Headache    Hypertension    Kidney stones    Myocardial infarction [facility]) 2007    Past Surgical History:  Procedure Laterality Date   ABDOMINAL AORTIC ENDOVASCULAR STENT GRAFT Bilateral 04/04/2022   Procedure: ABDOMINAL AORTIC ENDOVASCULAR STENT GRAFT;  Surgeon: Serene Gaile ORN, MD;  Location: [facility] OR;  Service: Vascular;  Laterality: Bilateral;   COLONOSCOPY WITH PROPOFOL  N/A 08/14/2020   Procedure: COLONOSCOPY WITH PROPOFOL ;  Surgeon: Golda Claudis PENNER, MD;  Location: [facility];  Service: Endoscopy;  Laterality: N/A;   CORONARY ANGIOPLASTY  1997   after mi   CYSTOSCOPY WITH RETROGRADE PYELOGRAM, URETEROSCOPY AND STENT PLACEMENT Left 06/28/2014   Procedure: 1ST STAGE CYSTOSCOPY/URETEROSCOPY/STENT PLACEMENT;  Surgeon: Ricardo Likens, MD;  Location: [facility];  Service: Urology;  Laterality: Left;   CYSTOSCOPY WITH STENT PLACEMENT Left 05/08/2014   Procedure: CYSTOSCOPY, RETROGRADE PYELOGRAM WITH LEFT URETERAL STENT PLACEMENT;  Surgeon: Ricardo Likens, MD;  Location: [facility];  Service: Urology;  Laterality: Left;   ENTEROSCOPY N/A 09/16/2020   Procedure: PUSH ENTEROSCOPY;  Surgeon: Eartha Angelia Sieving, MD;  [facility]: [facility];  Service: Gastroenterology;  Laterality: N/A;   ENTEROSCOPY N/A 10/25/2020   Procedure: PUSH ENTEROSCOPY;  Surgeon: Eartha Angelia Sieving, MD;  [facility]: [facility];  Service: Gastroenterology;  Laterality: N/A;   ESOPHAGOGASTRODUODENOSCOPY (EGD) WITH PROPOFOL  N/A 12/16/2019   Procedure: ESOPHAGOGASTRODUODENOSCOPY (EGD) WITH PROPOFOL ;  Surgeon: Shila Gustav GAILS, MD;  [facility];  Service: Endoscopy;  Laterality: N/A;   ESOPHAGOGASTRODUODENOSCOPY (EGD) WITH PROPOFOL  N/A 08/13/2020   Procedure: ESOPHAGOGASTRODUODENOSCOPY (  EGD) WITH PROPOFOL ;  Surgeon: Golda Claudis PENNER, MD;  [facility];  Service: Endoscopy;  Laterality: N/A;   ESOPHAGOGASTRODUODENOSCOPY (EGD) WITH PROPOFOL  N/A 09/16/2020   Procedure: ESOPHAGOGASTRODUODENOSCOPY (EGD) WITH PROPOFOL ;  Surgeon: Eartha Angelia Sieving, MD;  [facility];  Service: Gastroenterology;  Laterality: N/A;  1:55   ESOPHAGOGASTRODUODENOSCOPY (EGD) WITH PROPOFOL  N/A 10/25/2020   Procedure: ESOPHAGOGASTRODUODENOSCOPY (EGD) WITH PROPOFOL ;  Surgeon: Eartha Angelia Sieving, MD;  [facility];  Service: Gastroenterology;  Laterality: N/A;  2:00   EYE SURGERY Right may 2015   growth removed, july 2015left eye cataract removed, right eye catarct removed   gamma kniferadiation treatment  Feb 09 2014   [facility]   GIVENS CAPSULE STUDY N/A 10/11/2020   Procedure: GIVENS CAPSULE STUDY;  Surgeon: Eartha Angelia Sieving, MD;  [facility];  Service: Gastroenterology;  Laterality: N/A;  7:30   HEMOSTASIS CLIP PLACEMENT  12/16/2019   Procedure: HEMOSTASIS CLIP PLACEMENT;  Surgeon: Shila Gustav GAILS, MD;  [facility];  Service: Endoscopy;;   HEMOSTASIS CONTROL  12/16/2019   Procedure: HEMOSTASIS CONTROL;  Surgeon: Shila Gustav GAILS, MD;  [facility]: [facility];  Service: Endoscopy;;  Endoloop   HOLMIUM LASER APPLICATION Left 06/28/2014   Procedure: HOLMIUM LASER APPLICATION;  Surgeon: Ricardo Likens, MD;  Location: [facility];  Service: Urology;  Laterality: Left;   LITHOTRIPSY  years ago   POLYPECTOMY  12/16/2019   Procedure: POLYPECTOMY;  Surgeon: Shila Gustav GAILS, MD;  [facility];  Service: Endoscopy;;   STONE EXTRACTION WITH BASKET Left 06/28/2014   Procedure: STONE EXTRACTION WITH BASKET;  Surgeon: Ricardo Likens, MD;  [facility]: [facility];  Service: Urology;  Laterality: Left;    Social History   Socioeconomic History   Marital status: Married    Spouse name: Theodore Mendoza   Number of children: 1   Years of education: Not on file   Highest education level: Not on file  Occupational History   Occupation: retired   Tobacco Use   Smoking status: Never    Passive exposure: Never   Smokeless tobacco: Never  Vaping Use   Vaping status: Never Used  Substance and Sexual Activity   Alcohol use: No   Drug use: No   Sexual activity: Not Currently  Other Topics Concern   Not on file  Social History Narrative   Lives with wife - they have a basement but don't use it   One daughter - husband is a  Programmer, multimedia and they go to his church   Patient is paraplegic - uses wheelchair, sometimes motorized   He does still drive using hand controls.   Social Drivers of Corporate investment banker Strain: Low Risk  (09/10/2023)   Overall Financial Resource Strain (CARDIA)    Difficulty of Paying Living Expenses: Not hard at all  Food Insecurity: No Food Insecurity (09/10/2023)   Hunger Vital Sign    Worried About Running Out of Food in the Last Year: Never true    Ran Out of Food in the Last Year: Never true  Transportation Needs: No Transportation Needs (09/10/2023)   PRAPARE - Administrator, Civil Service (Medical): No    Lack of Transportation (Non-Medical): No  Physical Activity: Insufficiently Active (09/10/2023)   Exercise Vital Sign    Days of Exercise per Week: 3 days    Minutes of Exercise per Session: 10 min  Stress: No Stress Concern Present (09/10/2023)   [employer] of  Occupational Health - Occupational Stress Questionnaire    Feeling of Stress : Not at all  Social Connections: Socially Integrated (09/10/2023)   Social Connection and Isolation Panel    Frequency of Communication with Friends and Family: More than three times a week    Frequency of Social Gatherings with Friends and Family: More than three times a week    Attends Religious Services: More than 4 times per year    Active Member of [employer] or Organizations: Yes    Attends Engineer, structural: More than 4 times per year    Marital Status: Married  Catering manager Violence: Not At Risk (09/10/2023)   Humiliation, Afraid, Rape, and Kick questionnaire    Fear of Current or Ex-Partner: No    Emotionally Abused: No    Physically Abused: No    Sexually Abused: No    Family History  Problem Relation Age of Onset   Diabetes Mother    Lung cancer Mother    CAD Father    Diabetes Brother    Diabetes Sister    Stroke Sister    Other Maternal Grandfather        brain tumor   Colon cancer Neg  Hx    Pancreatic cancer Neg Hx    Esophageal cancer Neg Hx     Current Outpatient Medications  Medication Sig Dispense Refill   amLODipine  (NORVASC ) 5 MG tablet Take 1 tablet (5 mg total) by mouth daily. 90 tablet 0   apixaban  (ELIQUIS ) 5 MG TABS tablet Take 1 tablet (5 mg total) by mouth 2 (two) times daily. 180 tablet 3   dofetilide  (TIKOSYN ) 500 MCG capsule Take 1 capsule (500 mcg total) by mouth 2 (two) times daily. 180 capsule 3   escitalopram  (LEXAPRO ) 20 MG tablet Take 1 tablet (20 mg total) by mouth daily. 90 tablet 3   FEROSUL 325 (65 Fe) MG tablet TAKE 1 TABLET DAILY 90 tablet 0   finasteride  (PROSCAR ) 5 MG tablet Take 1 tablet (5 mg total) by mouth daily. For urine flow 90 tablet 3   furosemide  (LASIX ) 20 MG tablet Take 1 tablet (20 mg total) by mouth daily. 90 tablet 3   gabapentin  (NEURONTIN ) 300 MG capsule TAKE 1 CAPSULE BY MOUTH 3 TIMES A DAY 360 capsule 3   lisinopril  (ZESTRIL ) 5 MG tablet Take 1 tablet (5 mg total) by mouth daily. 90 tablet 3   loperamide  (IMODIUM  A-D) 2 MG tablet Take 1 tablet (2 mg total) by mouth 4 (four) times daily as needed for diarrhea or loose stools. 30 tablet 0   mirtazapine  (REMERON ) 7.5 MG tablet Take 1 tablet (7.5 mg total) by mouth at bedtime. 90 tablet 3   ondansetron  (ZOFRAN -ODT) 4 MG disintegrating tablet Take 1 tablet (4 mg total) by mouth every 8 (eight) hours as needed for nausea or vomiting. 12 tablet 0   pantoprazole  (PROTONIX ) 40 MG tablet Take 1 tablet (40 mg total) by mouth 2 (two) times daily. 180 tablet 3   pravastatin  (PRAVACHOL ) 40 MG tablet Take 1 tablet (40 mg total) by mouth daily. 90 tablet 3   promethazine  (PHENERGAN ) 25 MG tablet Take 0.5 tablets (12.5 mg total) by mouth every 6 (six) hours as needed for nausea or vomiting. (Patient not taking: Reported on 10/08/2023) 12 tablet 0   sucralfate  (CARAFATE ) 1 g tablet Take 1 tablet (1 g total) by mouth 2 (two) times daily. 180 tablet 3   sulfamethoxazole -trimethoprim  (BACTRIM )  400-80 MG tablet Take 1 tablet  by mouth 2 (two) times daily.     tamsulosin  (FLOMAX ) 0.4 MG CAPS capsule Take 2 capsules (0.8 mg total) by mouth at bedtime. For urine flow and prostate 180 capsule 3   vitamin B-12 (CYANOCOBALAMIN ) 1000 MCG tablet Take 1 tablet (1,000 mcg total) by mouth daily. 30 tablet 2   No current facility-administered medications for this visit.    Allergies  Allergen Reactions   Valsartan Rash   Morphine  And Codeine Other (See Comments)    Reaction:  Confusion/weakness/hallucinations     REVIEW OF SYSTEMS:  Negative unless noted in HPI [X]  denotes positive finding, [ ]  denotes negative finding Cardiac  Comments:  Chest pain or chest pressure:    Shortness of breath upon exertion:    Short of breath when lying flat:    Irregular heart rhythm:        Vascular    Pain in calf, thigh, or hip brought on by ambulation:    Pain in feet at night that wakes you up from your sleep:     Blood clot in your veins:    Leg swelling:         Pulmonary    Oxygen at home:    Productive cough:     Wheezing:         Neurologic    Sudden weakness in arms or legs:     Sudden numbness in arms or legs:     Sudden onset of difficulty speaking or slurred speech:    Temporary loss of vision in one eye:     Problems with dizziness:         Gastrointestinal    Blood in stool:     Vomited blood:         Genitourinary    Burning when urinating:     Blood in urine:        Psychiatric    Major depression:         Hematologic    Bleeding problems:    Problems with blood clotting too easily:        Skin    Rashes or ulcers:        Constitutional    Fever or chills:      PHYSICAL EXAMINATION:  Vitals:   11/25/23 1007  BP: (!) 150/66  Pulse: (!) 50  Temp: 98.2 F (36.8 C)  TempSrc: Temporal  Weight: 149 lb (67.6 kg)    General:  WDWN in NAD; vital signs documented above Gait: Not observed, in wheel chair HENT: WNL, normocephalic Pulmonary: normal  non-labored breathing Cardiac: regular HR Abdomen: soft, NT, no masses. Can palpable aortic pulse Vascular Exam/Pulses: 2+ radial, 2+ femoral pulses, unable to palpable distal pulses due to edema Extremities: without ischemic changes, without Gangrene , without cellulitis; without open wounds;  Musculoskeletal: no muscle wasting or atrophy  Neurologic: A&O X 3 Psychiatric:  The pt has Normal affect.   Non-Invasive Vascular Imaging:   VAS US  EVAR Duplex: Endovascular Aortic Repair (EVAR):  +----------+----------------+-------------------+-------------------+           Diameter AP (cm)Diameter Trans (cm)Velocities (cm/sec)  +----------+----------------+-------------------+-------------------+  Aorta    5.22            5.52               57                   +----------+----------------+-------------------+-------------------+  Right Limb1.54  1.55               51                   +----------+----------------+-------------------+-------------------+  Left Limb 1.43            1.42               102                  +----------+----------------+-------------------+-------------------+   +-------------+------+  Endoleak TypeType 2  +-------------+------+   Summary:  Abdominal Aorta: Patent endovascular aneurysm repair with evidence of endoleak. The largest aortic diameter has increased compared to prior exam. Previous diameter measurement was obtained on 05/27/23: 5.25 x 4.84 cm with endoleak.   ASSESSMENT/PLAN:: 83 y.o. male here for surveillance follow up of EVAR. This was performed by Dr. Serene in January 2024 due to 5.3 cm infrarenal abdominal aortic aneurysm. On his follow up CTA he was noted to have a type II endoleak. Duplex today continues to show endoleak. The size of his aneurysm when compared to his prior ultrasounds has continued to increase in size now with maximal diameter of 5.22 x 5.52 cm. I have recommended repeat CTA evaluation. I will  have him follow up with Dr. Serene over the next few weeks to review CTA findings and to discuss if there is any changes needed in management.   Teretha Damme, PA-C [facility] [phone]  Clinic MD:   Serene

## 2023-12-11 ENCOUNTER — Ambulatory Visit: Admitting: Family Medicine

## 2023-12-11 ENCOUNTER — Encounter: Payer: Self-pay | Admitting: Family Medicine

## 2023-12-11 VITALS — BP 147/53 | HR 50 | Temp 98.0°F | Ht 68.0 in | Wt 149.0 lb

## 2023-12-11 DIAGNOSIS — F332 Major depressive disorder, recurrent severe without psychotic features: Secondary | ICD-10-CM

## 2023-12-11 DIAGNOSIS — M25512 Pain in left shoulder: Secondary | ICD-10-CM | POA: Diagnosis not present

## 2023-12-11 DIAGNOSIS — Z8669 Personal history of other diseases of the nervous system and sense organs: Secondary | ICD-10-CM | POA: Diagnosis not present

## 2023-12-11 DIAGNOSIS — I4819 Other persistent atrial fibrillation: Secondary | ICD-10-CM | POA: Diagnosis not present

## 2023-12-11 DIAGNOSIS — G8222 Paraplegia, incomplete: Secondary | ICD-10-CM | POA: Diagnosis not present

## 2023-12-11 DIAGNOSIS — F411 Generalized anxiety disorder: Secondary | ICD-10-CM | POA: Diagnosis not present

## 2023-12-11 DIAGNOSIS — G610 Guillain-Barre syndrome: Secondary | ICD-10-CM | POA: Diagnosis not present

## 2023-12-11 MED ORDER — QUETIAPINE FUMARATE 25 MG PO TABS: 25.0000 mg | ORAL_TABLET | Freq: Every day | ORAL | 1 refills | Status: AC

## 2023-12-11 MED ORDER — ESCITALOPRAM OXALATE 20 MG PO TABS: 20.0000 mg | ORAL_TABLET | Freq: Every day | ORAL | 3 refills | Status: AC

## 2023-12-11 NOTE — Progress Notes (Unsigned)
 Subjective:  Patient ID: Theodore Mendoza, male    DOB: July 07, 1940  Age: 83 y.o. MRN: [id number]  CC: Medical Management of Chronic Issues (3 month follow up/Recent fall while in wheelchair)   HPI  Discussed the use of AI scribe software for clinical note transcription with the patient, who gave verbal consent to proceed.  History of Present Illness Theodore Mendoza is an 83 year old male with depression and hypertension who presents with concerns about depression and a recent fall.  Theodore Mendoza experiences significant depression, describing 'bad days' where Theodore Mendoza feels overwhelmed by multiple doctor's appointments. Theodore Mendoza is currently taking escitalopram , which Theodore Mendoza has been refilling appropriately, but Theodore Mendoza still experiences symptoms of depression. No suicidal ideation is present, as Theodore Mendoza states 'I'm not going to shoot myself.'  Theodore Mendoza describes a recent fall on August 3rd while going up a ramp at church, resulting in a fall down seven steps. Theodore Mendoza was taken to the hospital and stayed for six hours. Theodore Mendoza sustained an injury to his shoulder, which remains painful, particularly with flexion. X-rays were taken at the time of the fall, and Theodore Mendoza has been managing the pain since then.  Theodore Mendoza is wheelchair-bound and mentions receiving a new electric wheelchair, which Theodore Mendoza hopes will prevent future falls. Theodore Mendoza notes that his previous wheelchair rolled backwards during the fall, contributing to the accident.  Theodore Mendoza is currently taking Eliquis  (apixaban ) as a blood thinner and has been monitoring his blood pressure, which Theodore Mendoza reports as borderline high. Theodore Mendoza mentions a history of heart health monitoring and is up to date on his vaccinations.          12/11/2023   11:02 AM 09/24/2023   11:04 AM 09/10/2023    2:20 PM  Depression screen PHQ 2/9  Decreased Interest 1 1 0  Down, Depressed, Hopeless 2 1 0  PHQ - 2 Score 3 2 0  Altered sleeping 3 0 0  Tired, decreased energy 3 3 0  Change in appetite 0 0 0  Feeling bad or failure about yourself   3 2 0  Trouble concentrating 1 0 0  Moving slowly or fidgety/restless 3 0 0  Suicidal thoughts 1 0 0  PHQ-9 Score 17 7 0  Difficult doing work/chores  Very difficult     History Theodore Mendoza has a past medical history of AAA (abdominal aortic aneurysm) (HCC), Atrial fibrillation (HCC), Brain tumor (HCC), Cancer (HCC), Cardiac arrhythmia, CHF (congestive heart failure) (HCC), GERD (gastroesophageal reflux disease), Guillain-Barre syndrome (HCC) (02/13/1986), Headache, Hypertension, Kidney stones, and Myocardial infarction (HCC) (2007).   Theodore Mendoza has a past surgical history that includes Cystoscopy with stent placement (Left, 05/08/2014); Coronary angioplasty (1997); Eye surgery (Right, may 2015); gamma kniferadiation treatment (Feb 09 2014); Lithotripsy (years ago); Cystoscopy with retrograde pyelogram, ureteroscopy and stent placement (Left, 06/28/2014); Holmium laser application (Left, 06/28/2014); Stone extraction with basket (Left, 06/28/2014); Esophagogastroduodenoscopy (egd) with propofol  (N/A, 12/16/2019); Hemostasis clip placement (12/16/2019); Hemostasis control (12/16/2019); polypectomy (12/16/2019); Esophagogastroduodenoscopy (egd) with propofol  (N/A, 08/13/2020); Colonoscopy with propofol  (N/A, 08/14/2020); Esophagogastroduodenoscopy (egd) with propofol  (N/A, 09/16/2020); enteroscopy (N/A, 09/16/2020); Givens capsule study (N/A, 10/11/2020); Esophagogastroduodenoscopy (egd) with propofol  (N/A, 10/25/2020); enteroscopy (N/A, 10/25/2020); and Abdominal aortic endovascular stent graft (Bilateral, 04/04/2022).   His family history includes CAD in his father; Diabetes in his brother, mother, and sister; Lung cancer in his mother; Other in his maternal grandfather; Stroke in his sister.Theodore Mendoza reports that Theodore Mendoza has never smoked. Theodore Mendoza has never been exposed to tobacco smoke. Theodore Mendoza has never used smokeless tobacco.  Theodore Mendoza reports that Theodore Mendoza does not drink alcohol and does not use drugs.    ROS Review of Systems  Constitutional:  Positive for  fatigue.  HENT: Negative.    Eyes:  Negative for visual disturbance.  Respiratory:  Negative for cough and shortness of breath.   Cardiovascular:  Negative for chest pain and leg swelling.  Gastrointestinal:  Negative for abdominal pain, diarrhea, nausea and vomiting.  Genitourinary:  Negative for difficulty urinating.  Musculoskeletal:  Negative for myalgias.  Skin:  Negative for rash.  Neurological:  Negative for headaches.  Psychiatric/Behavioral:  Negative for sleep disturbance.     Objective:  BP (!) 147/53   Pulse (!) 50   Temp 98 F (36.7 C)   Ht 5' 8 (1.727 m) Comment: previous, wheelchair bound  Wt 149 lb (67.6 kg) Comment: previous, wheelchair bound  SpO2 96%   BMI 22.66 kg/m   BP Readings from Last 3 Encounters:  12/11/23 (!) 147/53  11/25/23 (!) 150/66  11/03/23 (!) 144/74    Wt Readings from Last 3 Encounters:  12/11/23 149 lb (67.6 kg)  11/25/23 149 lb (67.6 kg)  11/03/23 149 lb 14.6 oz (68 kg)     Physical Exam Constitutional:      General: Theodore Mendoza is not in acute distress.    Appearance: Theodore Mendoza is well-developed.  HENT:     Head: Normocephalic and atraumatic.     Right Ear: External ear normal.     Left Ear: External ear normal.     Nose: Nose normal.  Eyes:     Conjunctiva/sclera: Conjunctivae normal.     Pupils: Pupils are equal, round, and reactive to light.  Cardiovascular:     Rate and Rhythm: Normal rate and regular rhythm.     Heart sounds: Normal heart sounds. No murmur heard. Pulmonary:     Effort: Pulmonary effort is normal. No respiratory distress.     Breath sounds: Normal breath sounds. No wheezing or rales.  Abdominal:     Palpations: Abdomen is soft.     Tenderness: There is no abdominal tenderness.  Musculoskeletal:        General: Normal range of motion.     Cervical back: Normal range of motion and neck supple.  Skin:    General: Skin is warm and dry.  Neurological:     Mental Status: Theodore Mendoza is alert and oriented to person, place,  and time.     Deep Tendon Reflexes: Reflexes are normal and symmetric.  Psychiatric:        Behavior: Behavior normal.        Thought Content: Thought content normal.        Judgment: Judgment normal.      Assessment & Plan:  Acute pain of left shoulder -     Ambulatory referral to Physical Therapy  Severe episode of recurrent major depressive disorder, without psychotic features (HCC) -     Escitalopram  Oxalate; Take 1 tablet (20 mg total) by mouth daily.  Dispense: 90 tablet; Refill: 3  Generalized anxiety disorder -     Escitalopram  Oxalate; Take 1 tablet (20 mg total) by mouth daily.  Dispense: 90 tablet; Refill: 3  Other orders -     QUEtiapine  Fumarate; Take 1 tablet (25 mg total) by mouth at bedtime.  Dispense: 90 tablet; Refill: 1    Assessment and Plan Assessment & Plan Right shoulder pain and limited range of motion after fall   Theodore Mendoza experiences right shoulder pain and limited range of  motion following a fall on August 3rd. Pain is localized to the shoulder with difficulty in flexion, though Theodore Mendoza can abduct. X-rays showed no fractures. Symptoms have persisted for over a month, necessitating further intervention. Refer to physical therapy for shoulder rehabilitation to improve range of motion and reduce pain.  Paraplegia, incomplete   Ongoing management of incomplete paraplegia includes discussing the use of an electric wheelchair to aid mobility and prevent falls. The new electric wheelchair should provide stability and ease of movement.  Major depressive disorder   Theodore Mendoza reports bad days and feelings of discouragement, indicating ongoing depressive symptoms. Currently on escitalopram , but symptoms persist, suggesting the need for additional support. Prescribe quetiapine  to be taken at bedtime as an adjunct to escitalopram . Send prescriptions to [facility].  Essential hypertension   Blood pressure readings are borderline with systolic values around 147-146 and diastolic  values low at 50-53. Current management appears adequate.  General Health Maintenance   Theodore Mendoza is up to date on vaccinations. Encourage exploration of chair yoga for upper body exercises. Needs to schedule an appointment with an eye doctor.       Follow-up: Return in about 3 months (around 03/11/2024).  Butler Der, M.D.

## 2023-12-17 ENCOUNTER — Other Ambulatory Visit (HOSPITAL_COMMUNITY): Payer: Self-pay

## 2023-12-24 ENCOUNTER — Telehealth: Payer: Self-pay

## 2023-12-24 ENCOUNTER — Other Ambulatory Visit (HOSPITAL_COMMUNITY): Payer: Self-pay

## 2023-12-24 NOTE — Telephone Encounter (Signed)
 Pharmacy Patient Advocate Encounter   Received notification from CoverMyMeds that prior authorization for QUEtiapine  Fumarate 25MG  tablets is required/requested.   Insurance verification completed.   The patient is insured through [facility] .   Per test claim: PA required; PA submitted to above mentioned insurance via Latent Key/confirmation #/EOC [id number] Status is pending

## 2023-12-25 ENCOUNTER — Other Ambulatory Visit (HOSPITAL_COMMUNITY): Payer: Self-pay

## 2023-12-25 ENCOUNTER — Telehealth: Payer: Self-pay

## 2023-12-25 NOTE — Telephone Encounter (Signed)
 Copied from CRM #[id number]. Topic: Clinical - Medication Prior Auth >> Dec 25, 2023 11:39 AM Wess RAMAN wrote: Reason for CRM: Amber from [facility] wanted to notify Dr. Zollie prior authorization approval for QUEtiapine  (SEROQUEL ) 25 MG tablet. It was approved from 12/24/23-12/23/24. She will also fax over written information  Callback #: [phone] opt:5

## 2023-12-25 NOTE — Telephone Encounter (Signed)
 Pharmacy Patient Advocate Encounter  Received notification from [place] that Prior Authorization for QUEtiapine  Fumarate 25MG  tablets has been APPROVED from 12/24/23 to 12/23/24   PA #/Case ID/Reference #: [id number]

## 2023-12-30 ENCOUNTER — Ambulatory Visit: Admitting: Surgery

## 2024-01-03 ENCOUNTER — Ambulatory Visit (HOSPITAL_COMMUNITY)
Admission: RE | Admit: 2024-01-03 | Discharge: 2024-01-03 | Disposition: A | Discharge status: Home / Self Care | Source: Ambulatory Visit | Attending: Surgery | Admitting: Surgery

## 2024-01-03 DIAGNOSIS — I7143 Infrarenal abdominal aortic aneurysm, without rupture: Secondary | ICD-10-CM | POA: Diagnosis not present

## 2024-01-03 DIAGNOSIS — N281 Cyst of kidney, acquired: Secondary | ICD-10-CM | POA: Diagnosis not present

## 2024-01-03 DIAGNOSIS — I701 Atherosclerosis of renal artery: Secondary | ICD-10-CM | POA: Diagnosis not present

## 2024-01-03 MED ORDER — IOHEXOL 350 MG/ML SOLN
75.0000 mL | Freq: Once | INTRAVENOUS | Status: AC | PRN
Start: 2024-01-03 — End: 2024-01-03
  Administered 2024-01-03: 75 mL via INTRAVENOUS

## 2024-01-28 DIAGNOSIS — L570 Actinic keratosis: Secondary | ICD-10-CM | POA: Diagnosis not present

## 2024-01-28 DIAGNOSIS — D485 Neoplasm of uncertain behavior of skin: Secondary | ICD-10-CM | POA: Diagnosis not present

## 2024-01-28 DIAGNOSIS — L821 Other seborrheic keratosis: Secondary | ICD-10-CM | POA: Diagnosis not present

## 2024-01-28 DIAGNOSIS — L089 Local infection of the skin and subcutaneous tissue, unspecified: Secondary | ICD-10-CM | POA: Diagnosis not present

## 2024-02-03 ENCOUNTER — Encounter: Payer: Self-pay | Admitting: Surgery

## 2024-02-03 ENCOUNTER — Ambulatory Visit: Attending: Surgery | Admitting: Surgery

## 2024-02-03 VITALS — BP 125/64 | HR 55 | Temp 98.0°F

## 2024-02-03 DIAGNOSIS — I7143 Infrarenal abdominal aortic aneurysm, without rupture: Secondary | ICD-10-CM

## 2024-02-03 NOTE — Progress Notes (Signed)
 Vascular and Vein Specialist of [facility]  Patient name: Theodore Mendoza MRN: [id number] DOB: 1940-11-03 Sex: male   REASON FOR VISIT:    Follow-up  HISOTRY OF PRESENT ILLNESS:    Theodore Mendoza is a 83 y.o. male who is status post endovascular aneurysm repair in January 2024 for a 5.3 cm infrarenal abdominal aortic aneurysm.  His initial CT showed a type II endoleak.  Patient has a history of coronary artery disease status post PCI and 2008 for a NSTEMI.  He is on anticoagulation for atrial fibrillation.  He has undergone cardioversion.  He also has a history of TIA in 2017.  He was diagnosed with Guillain-Barr syndrome in 1987, and has been wheelchair-bound.  He has a meningioma that is being followed by neurosurgery.  PAST MEDICAL HISTORY:   Past Medical History:  Diagnosis Date   AAA (abdominal aortic aneurysm)    Atrial fibrillation (HCC)    Brain tumor (HCC)    x2 (benign per pt)   Cancer (HCC)    melanoma removed twice (head and right forearm)   Cardiac arrhythmia    CHF (congestive heart failure) (HCC)    GERD (gastroesophageal reflux disease)    Guillain-Barre syndrome 02/13/1986   Headache    Hypertension    Kidney stones    Myocardial infarction [facility]) 2007     FAMILY HISTORY:   Family History  Problem Relation Age of Onset   Diabetes Mother    Lung cancer Mother    CAD Father    Diabetes Brother    Diabetes Sister    Stroke Sister    Other Maternal Grandfather        brain tumor   Colon cancer Neg Hx    Pancreatic cancer Neg Hx    Esophageal cancer Neg Hx     SOCIAL HISTORY:   Social History   Tobacco Use   Smoking status: Never    Passive exposure: Never   Smokeless tobacco: Never  Substance Use Topics   Alcohol use: No     ALLERGIES:   Allergies  Allergen Reactions   Valsartan Rash   Morphine  And Codeine Other (See Comments)    Reaction:  Confusion/weakness/hallucinations     CURRENT  MEDICATIONS:   Current Outpatient Medications  Medication Sig Dispense Refill   amLODipine  (NORVASC ) 5 MG tablet Take 1 tablet (5 mg total) by mouth daily. 90 tablet 0   apixaban  (ELIQUIS ) 5 MG TABS tablet Take 1 tablet (5 mg total) by mouth 2 (two) times daily. 180 tablet 3   dofetilide  (TIKOSYN ) 500 MCG capsule Take 1 capsule (500 mcg total) by mouth 2 (two) times daily. 180 capsule 3   escitalopram  (LEXAPRO ) 20 MG tablet Take 1 tablet (20 mg total) by mouth daily. 90 tablet 3   FEROSUL 325 (65 Fe) MG tablet TAKE 1 TABLET DAILY 90 tablet 0   finasteride  (PROSCAR ) 5 MG tablet Take 1 tablet (5 mg total) by mouth daily. For urine flow 90 tablet 3   furosemide  (LASIX ) 20 MG tablet Take 1 tablet (20 mg total) by mouth daily. 90 tablet 3   gabapentin  (NEURONTIN ) 300 MG capsule TAKE 1 CAPSULE BY MOUTH 3 TIMES A DAY 360 capsule 3   lisinopril  (ZESTRIL ) 5 MG tablet Take 1 tablet (5 mg total) by mouth daily. 90 tablet 3   loperamide  (IMODIUM  A-D) 2 MG tablet Take 1 tablet (2 mg total) by mouth 4 (four) times daily as needed for diarrhea or loose stools.  30 tablet 0   mirtazapine  (REMERON ) 7.5 MG tablet Take 1 tablet (7.5 mg total) by mouth at bedtime. 90 tablet 3   ondansetron  (ZOFRAN -ODT) 4 MG disintegrating tablet Take 1 tablet (4 mg total) by mouth every 8 (eight) hours as needed for nausea or vomiting. 12 tablet 0   pantoprazole  (PROTONIX ) 40 MG tablet Take 1 tablet (40 mg total) by mouth 2 (two) times daily. 180 tablet 3   pravastatin  (PRAVACHOL ) 40 MG tablet Take 1 tablet (40 mg total) by mouth daily. 90 tablet 3   promethazine  (PHENERGAN ) 25 MG tablet Take 0.5 tablets (12.5 mg total) by mouth every 6 (six) hours as needed for nausea or vomiting. 12 tablet 0   QUEtiapine  (SEROQUEL ) 25 MG tablet Take 1 tablet (25 mg total) by mouth at bedtime. 90 tablet 1   sucralfate  (CARAFATE ) 1 g tablet Take 1 tablet (1 g total) by mouth 2 (two) times daily. 180 tablet 3   sulfamethoxazole -trimethoprim  (BACTRIM )  400-80 MG tablet Take 1 tablet by mouth 2 (two) times daily.     tamsulosin  (FLOMAX ) 0.4 MG CAPS capsule Take 2 capsules (0.8 mg total) by mouth at bedtime. For urine flow and prostate 180 capsule 3   vitamin B-12 (CYANOCOBALAMIN ) 1000 MCG tablet Take 1 tablet (1,000 mcg total) by mouth daily. 30 tablet 2   No current facility-administered medications for this visit.    REVIEW OF SYSTEMS:   [X]  denotes positive finding, [ ]  denotes negative finding Cardiac  Comments:  Chest pain or chest pressure:    Shortness of breath upon exertion:    Short of breath when lying flat:    Irregular heart rhythm:        Vascular    Pain in calf, thigh, or hip brought on by ambulation:    Pain in feet at night that wakes you up from your sleep:     Blood clot in your veins:    Leg swelling:         Pulmonary    Oxygen at home:    Productive cough:     Wheezing:         Neurologic    Sudden weakness in arms or legs:     Sudden numbness in arms or legs:     Sudden onset of difficulty speaking or slurred speech:    Temporary loss of vision in one eye:     Problems with dizziness:         Gastrointestinal    Blood in stool:     Vomited blood:         Genitourinary    Burning when urinating:     Blood in urine:        Psychiatric    Major depression:         Hematologic    Bleeding problems:    Problems with blood clotting too easily:        Skin    Rashes or ulcers:        Constitutional    Fever or chills:      PHYSICAL EXAM:   Vitals:   02/03/24 0839  BP: 125/64  Pulse: (!) 55  Temp: 98 F (36.7 C)  SpO2: 95%    GENERAL: The patient is a well-nourished male, in no acute distress. The vital signs are documented above. CARDIAC: There is a regular rate and rhythm.  PULMONARY: Non-labored respirations ABDOMEN: Soft and non-tender   MUSCULOSKELETAL: There are no major deformities or  cyanosis. NEUROLOGIC: No focal weakness or paresthesias are detected. SKIN: There are  no ulcers or rashes noted. PSYCHIATRIC: The patient has a normal affect.  STUDIES:   I have reviewed his CT scan with the following findings: 1. Postsurgical changes from placement of a bifurcated endovascular stent graft within a bilobed infrarenal abdominal aortic aneurysm. A type 2 endoleak is present in both lobes. The aneurysm has increased in size from 5.3 cm to 5.7 cm.  MEDICAL ISSUES:   AAA: The patient has a type II endoleak as seen on CT scan.  His aneurysm has increased from 5.3 to 5.7 cm.  I discussed 2 options with the patient and his wife.  The first would be continued observation with serial imaging.  The second would be discussions with interventional radiology for embolization of the endoleak.  At this time he is unsure what he wants to do but would like to discuss this further with interventional radiology.  I will arrange this in the near future.  I have not scheduled to follow-up with us  in 6 months with a repeat ultrasound.    Malvina Serene CLORE, MD, FACS [facility] [phone] Pager [phone]

## 2024-02-05 ENCOUNTER — Encounter (INDEPENDENT_AMBULATORY_CARE_PROVIDER_SITE_OTHER): Payer: Self-pay | Admitting: Gastroenterology

## 2024-02-06 ENCOUNTER — Other Ambulatory Visit: Payer: Self-pay | Admitting: *Deleted

## 2024-02-06 DIAGNOSIS — I9789 Other postprocedural complications and disorders of the circulatory system, not elsewhere classified: Secondary | ICD-10-CM

## 2024-02-07 ENCOUNTER — Inpatient Hospital Stay: Attending: Oncology

## 2024-02-07 DIAGNOSIS — D509 Iron deficiency anemia, unspecified: Secondary | ICD-10-CM | POA: Diagnosis not present

## 2024-02-07 DIAGNOSIS — D508 Other iron deficiency anemias: Secondary | ICD-10-CM

## 2024-02-07 LAB — COMPREHENSIVE METABOLIC PANEL WITH GFR
ALT: 5 U/L (ref 0–44)
AST: 12 U/L — ABNORMAL LOW (ref 15–41)
Albumin: 3.7 g/dL (ref 3.5–5.0)
Alkaline Phosphatase: 66 U/L (ref 38–126)
Anion gap: 3 — ABNORMAL LOW (ref 5–15)
BUN: 11 mg/dL (ref 8–23)
CO2: 36 mmol/L — ABNORMAL HIGH (ref 22–32)
Calcium: 8.8 mg/dL — ABNORMAL LOW (ref 8.9–10.3)
Chloride: 108 mmol/L (ref 98–111)
Creatinine, Ser: 0.63 mg/dL (ref 0.61–1.24)
GFR, Estimated: >60 mL/min (ref >60)
Glucose, Bld: 119 mg/dL — ABNORMAL HIGH (ref 70–99)
Potassium: 4 mmol/L (ref 3.5–5.1)
Sodium: 147 mmol/L — ABNORMAL HIGH (ref 135–145)
Total Bilirubin: 0.4 mg/dL (ref 0.0–1.2)
Total Protein: 5.6 g/dL — ABNORMAL LOW (ref 6.5–8.1)

## 2024-02-07 LAB — CBC WITH DIFFERENTIAL/PLATELET
Abs Immature Granulocytes: 0.01 K/uL (ref 0.00–0.07)
Basophils Absolute: 0.1 K/uL (ref 0.0–0.1)
Basophils Relative: 1 %
Eosinophils Absolute: 0.2 K/uL (ref 0.0–0.5)
Eosinophils Relative: 5 %
HCT: 37.1 % — ABNORMAL LOW (ref 39.0–52.0)
Hemoglobin: 11.4 g/dL — ABNORMAL LOW (ref 13.0–17.0)
Immature Granulocytes: 0 %
Lymphocytes Relative: 16 %
Lymphs Abs: 0.7 K/uL (ref 0.7–4.0)
MCH: 29 pg (ref 26.0–34.0)
MCHC: 30.7 g/dL (ref 30.0–36.0)
MCV: 94.4 fL (ref 80.0–100.0)
Monocytes Absolute: 0.2 K/uL (ref 0.1–1.0)
Monocytes Relative: 5 %
Neutro Abs: 3.2 K/uL (ref 1.7–7.7)
Neutrophils Relative %: 73 %
Platelets: 137 K/uL — ABNORMAL LOW (ref 150–400)
RBC: 3.93 MIL/uL — ABNORMAL LOW (ref 4.22–5.81)
RDW: 13.2 % (ref 11.5–15.5)
WBC: 4.4 K/uL (ref 4.0–10.5)
nRBC: 0 % (ref 0.0–0.2)

## 2024-02-07 LAB — IRON AND TIBC
Iron: 56 ug/dL (ref 45–182)
Saturation Ratios: 17 % — ABNORMAL LOW (ref 17.9–39.5)
TIBC: 325 ug/dL (ref 250–450)
UIBC: 269 ug/dL

## 2024-02-07 LAB — FERRITIN: Ferritin: 38 ng/mL (ref 24–336)

## 2024-02-14 ENCOUNTER — Inpatient Hospital Stay: Admitting: Oncology

## 2024-02-14 ENCOUNTER — Emergency Department (HOSPITAL_COMMUNITY)
Admission: EM | Admit: 2024-02-14 | Discharge: 2024-02-14 | Disposition: A | Discharge status: Home / Self Care | Attending: Emergency Medicine | Admitting: Emergency Medicine

## 2024-02-14 ENCOUNTER — Encounter (HOSPITAL_COMMUNITY): Payer: Self-pay | Admitting: Emergency Medicine

## 2024-02-14 ENCOUNTER — Other Ambulatory Visit: Payer: Self-pay

## 2024-02-14 ENCOUNTER — Emergency Department (HOSPITAL_COMMUNITY)

## 2024-02-14 DIAGNOSIS — R600 Localized edema: Secondary | ICD-10-CM | POA: Insufficient documentation

## 2024-02-14 DIAGNOSIS — Z7901 Long term (current) use of anticoagulants: Secondary | ICD-10-CM | POA: Insufficient documentation

## 2024-02-14 DIAGNOSIS — R110 Nausea: Secondary | ICD-10-CM | POA: Insufficient documentation

## 2024-02-14 DIAGNOSIS — I714 Abdominal aortic aneurysm, without rupture, unspecified: Secondary | ICD-10-CM

## 2024-02-14 DIAGNOSIS — R112 Nausea with vomiting, unspecified: Secondary | ICD-10-CM | POA: Diagnosis not present

## 2024-02-14 DIAGNOSIS — I701 Atherosclerosis of renal artery: Secondary | ICD-10-CM | POA: Diagnosis not present

## 2024-02-14 DIAGNOSIS — R1030 Lower abdominal pain, unspecified: Secondary | ICD-10-CM | POA: Insufficient documentation

## 2024-02-14 DIAGNOSIS — I7143 Infrarenal abdominal aortic aneurysm, without rupture: Secondary | ICD-10-CM | POA: Diagnosis not present

## 2024-02-14 DIAGNOSIS — I77810 Thoracic aortic ectasia: Secondary | ICD-10-CM | POA: Diagnosis not present

## 2024-02-14 LAB — CBC WITH DIFFERENTIAL/PLATELET
Abs Immature Granulocytes: 0.05 K/uL (ref 0.00–0.07)
Basophils Absolute: 0.1 K/uL (ref 0.0–0.1)
Basophils Relative: 1 %
Eosinophils Absolute: 0 K/uL (ref 0.0–0.5)
Eosinophils Relative: 0 %
HCT: 40.1 % (ref 39.0–52.0)
Hemoglobin: 13.1 g/dL (ref 13.0–17.0)
Immature Granulocytes: 1 %
Lymphocytes Relative: 9 %
Lymphs Abs: 0.8 K/uL (ref 0.7–4.0)
MCH: 29.6 pg (ref 26.0–34.0)
MCHC: 32.7 g/dL (ref 30.0–36.0)
MCV: 90.7 fL (ref 80.0–100.0)
Monocytes Absolute: 0.3 K/uL (ref 0.1–1.0)
Monocytes Relative: 4 %
Neutro Abs: 7.3 K/uL (ref 1.7–7.7)
Neutrophils Relative %: 85 %
Platelets: 166 K/uL (ref 150–400)
RBC: 4.42 MIL/uL (ref 4.22–5.81)
RDW: 13.4 % (ref 11.5–15.5)
WBC: 8.5 K/uL (ref 4.0–10.5)
nRBC: 0 % (ref 0.0–0.2)

## 2024-02-14 LAB — I-STAT CHEM 8, ED
BUN: 21 mg/dL (ref 8–23)
Calcium, Ion: 1.03 mmol/L — ABNORMAL LOW (ref 1.15–1.40)
Chloride: 105 mmol/L (ref 98–111)
Creatinine, Ser: 0.8 mg/dL (ref 0.61–1.24)
Glucose, Bld: 88 mg/dL (ref 70–99)
HCT: 38 % — ABNORMAL LOW (ref 39.0–52.0)
Hemoglobin: 12.9 g/dL — ABNORMAL LOW (ref 13.0–17.0)
Potassium: 3.7 mmol/L (ref 3.5–5.1)
Sodium: 140 mmol/L (ref 135–145)
TCO2: 24 mmol/L (ref 22–32)

## 2024-02-14 LAB — COMPREHENSIVE METABOLIC PANEL WITH GFR
ALT: 8 U/L (ref 0–44)
AST: 17 U/L (ref 15–41)
Albumin: 4 g/dL (ref 3.5–5.0)
Alkaline Phosphatase: 56 U/L (ref 38–126)
Anion gap: 14 (ref 5–15)
BUN: 18 mg/dL (ref 8–23)
CO2: 23 mmol/L (ref 22–32)
Calcium: 9.1 mg/dL (ref 8.9–10.3)
Chloride: 105 mmol/L (ref 98–111)
Creatinine, Ser: 0.93 mg/dL (ref 0.61–1.24)
GFR, Estimated: >60 mL/min (ref >60)
Glucose, Bld: 91 mg/dL (ref 70–99)
Potassium: 3.7 mmol/L (ref 3.5–5.1)
Sodium: 142 mmol/L (ref 135–145)
Total Bilirubin: 1.8 mg/dL — ABNORMAL HIGH (ref 0.0–1.2)
Total Protein: 6.1 g/dL — ABNORMAL LOW (ref 6.5–8.1)

## 2024-02-14 LAB — LIPASE, BLOOD: Lipase: 18 U/L (ref 11–51)

## 2024-02-14 MED ORDER — ONDANSETRON 4 MG PO TBDP
8.0000 mg | ORAL_TABLET | Freq: Once | ORAL | Status: AC
  Administered 2024-02-14: 8 mg via ORAL
  Filled 2024-02-14: qty 2

## 2024-02-14 MED ORDER — IOHEXOL 350 MG/ML SOLN
75.0000 mL | Freq: Once | INTRAVENOUS | Status: AC | PRN
  Administered 2024-02-14: 75 mL via INTRAVENOUS

## 2024-02-14 MED ORDER — ONDANSETRON 4 MG PO TBDP: 4.0000 mg | ORAL_TABLET | Freq: Three times a day (TID) | ORAL | 0 refills | Status: AC | PRN

## 2024-02-14 NOTE — Discharge Instructions (Addendum)
 Your nausea and stomach pain do not seem to be related to your AAA.  Our CT scan showed that it has not changed in size since your study earlier this month.  You should follow-up with Dr. Serene to discuss next steps as previously talked about.  You should also call your primary doctor tomorrow to inform him that you were seen here in the emergency department and make any necessary follow-up appointments.  Should your symptoms return, and you are unable to eat or drink anything for your stomach pain gets worse please do not hesitate to return to the emergency department.

## 2024-02-14 NOTE — ED Provider Notes (Signed)
 [facility] AT [facility] Provider Note   CSN: [id number] Arrival date & time: 02/14/24  1343     Patient presents with: No chief complaint on file.   Theodore Mendoza is a 83 y.o. male.   Known history of AAA with type II endoleak recently grown from 5.3 cm to 5.7 cm presents with abdominal pain, nausea.  Patient was recently seen by Dr. Serene on 11/3 who recommended that if his baseline pain got worse he should go to the emergency department to be seen.  Patient is currently hemodynamically stable.        Prior to Admission medications   Medication Sig Start Date End Date Taking? Authorizing Provider  amLODipine  (NORVASC ) 5 MG tablet Take 1 tablet (5 mg total) by mouth daily. 09/24/23   Zollie Lowers, MD  apixaban  (ELIQUIS ) 5 MG TABS tablet Take 1 tablet (5 mg total) by mouth 2 (two) times daily. 02/25/23   Zollie Lowers, MD  dofetilide  (TIKOSYN ) 500 MCG capsule Take 1 capsule (500 mcg total) by mouth 2 (two) times daily. 06/04/23   Zollie Lowers, MD  escitalopram  (LEXAPRO ) 20 MG tablet Take 1 tablet (20 mg total) by mouth daily. 12/11/23   Zollie Lowers, MD  FEROSUL 325 (65 Fe) MG tablet TAKE 1 TABLET DAILY 10/12/21   Castaneda Mayorga, Toribio, MD  finasteride  (PROSCAR ) 5 MG tablet Take 1 tablet (5 mg total) by mouth daily. For urine flow 09/09/23   Zollie Lowers, MD  furosemide  (LASIX ) 20 MG tablet Take 1 tablet (20 mg total) by mouth daily. 02/25/23   Zollie Lowers, MD  gabapentin  (NEURONTIN ) 300 MG capsule TAKE 1 CAPSULE BY MOUTH 3 TIMES A DAY 02/25/23   Zollie Lowers, MD  lisinopril  (ZESTRIL ) 5 MG tablet Take 1 tablet (5 mg total) by mouth daily. 02/25/23   Zollie Lowers, MD  loperamide  (IMODIUM  A-D) 2 MG tablet Take 1 tablet (2 mg total) by mouth 4 (four) times daily as needed for diarrhea or loose stools. 10/08/23   [facility] [place] Sebastian Pool, NP  mirtazapine  (REMERON ) 7.5 MG tablet Take 1 tablet (7.5 mg total) by mouth at bedtime. 02/25/23    Zollie Lowers, MD  ondansetron  (ZOFRAN -ODT) 4 MG disintegrating tablet Take 1 tablet (4 mg total) by mouth every 8 (eight) hours as needed for nausea or vomiting. 10/08/23   [facility] Sebastian Pool, NP  pantoprazole  (PROTONIX ) 40 MG tablet Take 1 tablet (40 mg total) by mouth 2 (two) times daily. 02/25/23   Zollie Lowers, MD  pravastatin  (PRAVACHOL ) 40 MG tablet Take 1 tablet (40 mg total) by mouth daily. 02/25/23   Zollie Lowers, MD  promethazine  (PHENERGAN ) 25 MG tablet Take 0.5 tablets (12.5 mg total) by mouth every 6 (six) hours as needed for nausea or vomiting. 04/02/23   Ula Prentice SAUNDERS, MD  QUEtiapine  (SEROQUEL ) 25 MG tablet Take 1 tablet (25 mg total) by mouth at bedtime. 12/11/23   Zollie Lowers, MD  sucralfate  (CARAFATE ) 1 g tablet Take 1 tablet (1 g total) by mouth 2 (two) times daily. 02/25/23   Zollie Lowers, MD  sulfamethoxazole -trimethoprim  (BACTRIM ) 400-80 MG tablet Take 1 tablet by mouth 2 (two) times daily. 09/06/23   [provider]  tamsulosin  (FLOMAX ) 0.4 MG CAPS capsule Take 2 capsules (0.8 mg total) by mouth at bedtime. For urine flow and prostate 02/25/23   Zollie Lowers, MD  vitamin B-12 (CYANOCOBALAMIN ) 1000 MCG tablet Take 1 tablet (1,000 mcg total) by mouth daily. 12/18/19   Bryn,  Bernardino NOVAK, MD    Allergies: Valsartan and Morphine  and codeine    Review of Systems  Updated Vital Signs BP (!) 177/86   Pulse 84   Temp (!) 97.5 F (36.4 C)   Resp 15   SpO2 96%   Physical Exam Constitutional:      Appearance: Normal appearance.  HENT:     Head: Normocephalic and atraumatic.     Mouth/Throat:     Mouth: Mucous membranes are moist.     Pharynx: Oropharynx is clear.  Eyes:     Extraocular Movements: Extraocular movements intact.     Pupils: Pupils are equal, round, and reactive to light.  Cardiovascular:     Rate and Rhythm: Normal rate and regular rhythm.     Pulses: Normal pulses.  Pulmonary:     Effort: Pulmonary effort is normal.     Breath  sounds: Normal breath sounds.  Abdominal:     General: Abdomen is flat. Bowel sounds are normal.     Palpations: Abdomen is soft.  Musculoskeletal:        General: Normal range of motion.     Cervical back: Normal range of motion.     Right lower leg: Edema present.     Left lower leg: Edema present.  Skin:    General: Skin is warm and dry.     Capillary Refill: Capillary refill takes less than 2 seconds.  Neurological:     General: No focal deficit present.     Mental Status: He is alert and oriented to person, place, and time.     (all labs ordered are listed, but only abnormal results are displayed) Labs Reviewed  COMPREHENSIVE METABOLIC PANEL WITH GFR - Abnormal; Notable for the following components:      Result Value   Total Protein 6.1 (*)    Total Bilirubin 1.8 (*)    All other components within normal limits  I-STAT CHEM 8, ED - Abnormal; Notable for the following components:   Calcium, Ion 1.03 (*)    Hemoglobin 12.9 (*)    HCT 38.0 (*)    All other components within normal limits  CBC WITH DIFFERENTIAL/PLATELET  LIPASE, BLOOD  URINALYSIS, ROUTINE W REFLEX MICROSCOPIC    EKG: None  Radiology: CT Angio Chest/Abd/Pel for Dissection W and/or Wo Contrast Result Date: 02/14/2024 CLINICAL DATA:  Known abdominal aortic aneurysm with a type 2 endoleak. Patient now having lower abdominal pain with nausea/vomiting/diarrhea. EXAM: CT ANGIOGRAPHY CHEST, ABDOMEN AND PELVIS TECHNIQUE: Non-contrast CT of the chest was initially obtained. Multidetector CT imaging through the chest, abdomen and pelvis was performed using the standard protocol during bolus administration of intravenous contrast. Multiplanar reconstructed images and MIPs were obtained and reviewed to evaluate the vascular anatomy. RADIATION DOSE REDUCTION: This exam was performed according to the departmental dose-optimization program which includes automated exposure control, adjustment of the mA and/or kV according  to patient size and/or use of iterative reconstruction technique. CONTRAST:  75mL OMNIPAQUE  IOHEXOL  350 MG/ML SOLN COMPARISON:  None Available. FINDINGS: CTA CHEST FINDINGS Cardiovascular: Redemonstration of mild dilatation of the ascending thoracic aorta measuring 4.2 cm and without change when compared with previous study from 03/17/2020. Moderate calcified atherosclerosis involving the aortic arch. No aortic dissection. Cardiac size unremarkable. No pericardial effusion. Coronary artery calcifications not fully evaluated this non gated study. Mediastinum/Nodes: No mediastinal or hilar lymphadenopathy. Lungs/Pleura: Lungs are clear. No pneumothorax or pleural effusion. Trachea and visualized bronchi clear and grossly unremarkable. Musculoskeletal: Age related changes in  the cervical and thoracic spine. Review of the MIP images confirms the above findings. CTA ABDOMEN AND PELVIS FINDINGS VASCULAR Aorta: Redemonstration of an abdominal aortic infrarenal aneurysm with a type 2 endoleak involving the IMA and paired L4 lumbar arteries. Overall the aneurysm sac has increased in size when compared with previous study and now measures 5.7 cm x 5.5 cm when measured at a similar location as measured on the previous study from 01/05/2024. Celiac: Patent common but 60-70% stenosis at the origin secondary to calcified plaque. SMA: Proximally 40% stenotic, but patent and unchanged. Renals: Redemonstration of calcified plaque at the origins of the right and left renal arteries with the left renal artery demonstrating proximally 75% stenosis. Kidneys enhance in a symmetric fashion. IMA: Patent and contributes to the type 2 endoleak Inflow: Patent bilaterally without significant stenosis through the stented portion. Markedly calcified external iliac arteries and common femoral arteries bilaterally. Veins: No evidence of DVT or significant abnormal varices. Review of the MIP images confirms the above findings. NON-VASCULAR  Hepatobiliary: The liver and gallbladder are grossly unremarkable. No abnormal enhancing mass. Pancreas: Pancreas is atrophied and otherwise unremarkable. Spleen: Unremarkable Adrenals/Urinary Tract: Unremarkable and without abnormal enhancing mass. Numerous cysts identified bilaterally and without change when compared with the previous studies. Stomach/Bowel: The stomach, small bowel, and colon are unremarkable. Appendix unremarkable for size and without acute inflammation. Lymphatic: No intraabdominal or pelvic adenopathy. A few mildly enlarged lymph nodes are identified at the bilateral common femoral artery region. Reproductive: Prostate gland mildly enlarged and demonstrates calcifications. Other: No ascites or evidence of acute hemorrhage. Musculoskeletal: Degenerative changes identified throughout the axial skeleton without evidence of aggressive osseous destruction. Muscular structures age-appropriate. Review of the MIP images confirms the above findings. IMPRESSION: No acute intra-abdominal finding that would suggest etiology to the patient's symptoms of nausea, vomiting, diarrhea. No abnormal abdominal or pelvic fluid collection. No abdominal or pelvic adenopathy. Redemonstration of an abdominal aortic aneurysm which has increased in size when compared with the previous study from 01/05/2024 and continues to show a type 2 endoleak from the IMA and paired L4 lumbar arteries. Redemonstration dilated ascending thoracic aorta measuring 4.2 cm and without change when compared previous study from 03/17/2020 Electronically Signed   By: Cordella Banner   On: 02/14/2024 16:40     Procedures   Medications Ordered in the ED  ondansetron  (ZOFRAN -ODT) disintegrating tablet 8 mg (has no administration in time range)  iohexol  (OMNIPAQUE ) 350 MG/ML injection 75 mL (75 mLs Intravenous Contrast Given 02/14/24 1531)    Clinical Course as of 02/14/24 2055  Fri Feb 14, 2024  2054 Symptoms resolved, tolerating po.  Imaging stable.  [SG]    Clinical Course User Index [SG] Elnor Jayson LABOR, DO                                 Medical Decision Making This is a pleasant 83 year old gentleman with a known AAA here for evaluation to worsening type II endoleak.  CT shows no change from what was reported on 11/3 by Dr. Serene.  Patient mostly concerned about his nausea, will give dose of Zofran  and monitor blood pressure to ensure it returns to a stable level.  Blood pressure stable at 162/75.  Patient's symptoms have resolved with Zofran .  Instructed patient to take home blood pressure medications when he is discharged and follow-up with vascular surgery and his primary care doctor tomorrow.  Risk Prescription  drug management.   Final diagnoses:  None    ED Discharge Orders     None          Cleotilde Lukes, DO 02/14/24 2055    Elnor Jayson LABOR, DO 02/14/24 2057

## 2024-02-14 NOTE — ED Triage Notes (Signed)
 Pt AAA with type 2 endo leak and increase in size of aneurysm idenetified on CT from 10/3.  Pt has had abd pain and nausea without appetite x 2 days.  Sent by doctor to assess stability of aneurysm and leak.

## 2024-02-14 NOTE — ED Triage Notes (Signed)
 Pt c/o lower abdominal pain and nausea that started 2 days ago. Has hx of abdominal aortic aneurysm, advised by doctor to come to ER if pain increased.

## 2024-02-14 NOTE — ED Provider Triage Note (Signed)
 Emergency Medicine Provider Triage Evaluation Note  OMER PUCCINELLI , a 83 y.o. male  was evaluated in triage.  Pt complains of abd pain. Lower abd pain with n/v/d and mild sob since yesterday.  No dizzy or lighthead, no cp, or back pain, no urinary sxs.  Hx of AAA with endoleak.  Review of Systems  Positive: As above Negative: As above  Physical Exam  BP (!) 146/97 (BP Location: Left Arm)   Pulse 89   Temp 98.3 F (36.8 C)   Resp 20   SpO2 96%  Gen:   Awake, no distress   Resp:  Normal effort  MSK:   Moves extremities without difficulty  Other:    Medical Decision Making  Medically screening exam initiated at 2:16 PM.  Appropriate orders placed.  Dwyne L Buccheri was informed that the remainder of the evaluation will be completed by another provider, this initial triage assessment does not replace that evaluation, and the importance of remaining in the ED until their evaluation is complete.     Nivia Colon, PA-C 02/14/24 [phone]

## 2024-02-17 ENCOUNTER — Other Ambulatory Visit: Payer: Self-pay | Admitting: *Deleted

## 2024-02-17 DIAGNOSIS — I10 Essential (primary) hypertension: Secondary | ICD-10-CM

## 2024-02-17 DIAGNOSIS — K219 Gastro-esophageal reflux disease without esophagitis: Secondary | ICD-10-CM

## 2024-03-03 ENCOUNTER — Ambulatory Visit (INDEPENDENT_AMBULATORY_CARE_PROVIDER_SITE_OTHER): Payer: Medicare Other | Admitting: Gastroenterology

## 2024-03-03 ENCOUNTER — Telehealth (INDEPENDENT_AMBULATORY_CARE_PROVIDER_SITE_OTHER): Payer: Self-pay

## 2024-03-03 ENCOUNTER — Encounter (INDEPENDENT_AMBULATORY_CARE_PROVIDER_SITE_OTHER): Payer: Self-pay | Admitting: Gastroenterology

## 2024-03-03 VITALS — BP 135/69 | HR 61 | Temp 98.0°F | Ht 68.0 in | Wt 150.0 lb

## 2024-03-03 DIAGNOSIS — D509 Iron deficiency anemia, unspecified: Secondary | ICD-10-CM | POA: Diagnosis not present

## 2024-03-03 DIAGNOSIS — R110 Nausea: Secondary | ICD-10-CM | POA: Diagnosis not present

## 2024-03-03 NOTE — Patient Instructions (Addendum)
 Continue iron  twice daily We will get you scheduled for upper endoscopy for further evaluation You can continue to use nausea meds as needed  Follow up 3 months  It was a pleasure to see you today. I want to create trusting relationships with patients and provide genuine, compassionate, and quality care. I truly value your feedback! please be on the lookout for a survey regarding your visit with me today. I appreciate your input about our visit and your time in completing this!    Markala Sitts L. Adalyne Lovick, MSN, APRN, AGNP-C Adult-Gerontology Nurse Practitioner [facility]

## 2024-03-03 NOTE — Telephone Encounter (Signed)
 Please schedule this, ask him to hold Eliquis  for 2 days Thanks

## 2024-03-03 NOTE — H&P (View-Only) (Signed)
 Referring Provider: Zollie Lowers, MD Primary Care Physician:  Zollie Lowers, MD Primary GI Physician: Dr. Eartha   Chief Complaint  Patient presents with   Follow-up    Patient here today for a follow up on IDA. Patient says he has nausea and has had a recent Ct scan of abdomen on 02/03/2024 at recent Ed visit at [facility]. He reports he has issues with dark stools, denies any bright red blood per rectum. He has occasional shortness of breath, denies any issues with dizziness or chest pain. 02/14/2024 last hgb was 13.1, Fe sat 17% on 02/07/2024. He is taking Ferrous sulfate  bid.    HPI:   Theodore Mendoza is a 83 y.o. male with past medical history of brain tumor, afib on Eliquis , Illene Shine and heart failure,    Patient presenting today for:  IDA, nausea   Last seen December 2024, by Dr. Eartha, at that time taking PO iron  BID, feeling well. Hgb 11.3  Recommended CBC, Iron  studies, continue PO iron  BID  -Recent ED visit for n/v/d -CT Angio chest/abd/pelvis No acute intra-abdominal finding that would suggest etiology to the patient's symptoms of nausea, vomiting, diarrhea. No abnormal abdominal or pelvic fluid collection. No abdominal or pelvic adenopathy.  Labs on 11/14 with hgb 13.1 lipase 18 CMP normal  Iron  studies on 11/7 with iron  56  TIBC 325 sat 17 ferritin 38  Present:  States he began having nausea in October which prompted an ED visit, was told everything looked ok, given some nausea meds which have helped. He reports nausea is only occasionally. No abdominal pain. Previously having watery diarrhea but this has resolved. He denies any BRB in stools, has black stools but is taking iron  pills BID, he reports this is not new. Appetite is good. Weight is stable. He feels some foods he eats may bring on the nausea. He is taking carafate  given to him by PCP which seems to help some. Denies any GERD symptoms, he is maintained on protonix  40mg  daily. Denies any early  satiety or fullness. No new medications or antibiotics recently. He is taking aleve 2-3 times per week for the last few months.    Last EGD: 08/13/2020 - Normal hypopharynx. - Normal esophagus. - Z- line irregular, 41 cm from the incisors. - Two gastric polyps. - Two clips on place at previous polypectomy site without stigmata of bleed. - Normal duodenal bulb, second portion of the duodenum and third portion of the duodenum.   Capsule endoscopy 10/11/2020 - 1 nonbleeding AVM in the duodenum   First push enteroscopy 09/16/2020 Endo Clip found in the gastric body, normal duodenum and jejunum   Second push enteroscopy 10/25/2020, normal esophagus, stomach, duodenum and jejunum   Last Colonoscopy: 08/14/2020 Diverticulosis, internal and external hemorrhoids  Filed Weights   03/03/24 1327  Weight: 150 lb (68 kg)     Past Medical History:  Diagnosis Date   AAA (abdominal aortic aneurysm)    Atrial fibrillation (HCC)    Brain tumor (HCC)    x2 (benign per pt)   Cancer (HCC)    melanoma removed twice (head and right forearm)   Cardiac arrhythmia    CHF (congestive heart failure) (HCC)    GERD (gastroesophageal reflux disease)    Guillain-Barre syndrome 02/13/1986   Headache    Hypertension    Kidney stones    Myocardial infarction [facility]) 2007    Past Surgical History:  Procedure Laterality Date   ABDOMINAL AORTIC ENDOVASCULAR STENT GRAFT  Bilateral 04/04/2022   Procedure: ABDOMINAL AORTIC ENDOVASCULAR STENT GRAFT;  Surgeon: Serene Gaile ORN, MD;  Location: [facility] OR;  Service: Vascular;  Laterality: Bilateral;   COLONOSCOPY WITH PROPOFOL  N/A 08/14/2020   Procedure: COLONOSCOPY WITH PROPOFOL ;  Surgeon: Golda Claudis PENNER, MD;  [facility];  Service: Endoscopy;  Laterality: N/A;   CORONARY ANGIOPLASTY  1997   after mi   CYSTOSCOPY WITH RETROGRADE PYELOGRAM, URETEROSCOPY AND STENT PLACEMENT Left 06/28/2014   Procedure: 1ST STAGE CYSTOSCOPY/URETEROSCOPY/STENT PLACEMENT;  Surgeon:  Ricardo Likens, MD;  Location: [facility];  Service: Urology;  Laterality: Left;   CYSTOSCOPY WITH STENT PLACEMENT Left 05/08/2014   Procedure: CYSTOSCOPY, RETROGRADE PYELOGRAM WITH LEFT URETERAL STENT PLACEMENT;  Surgeon: Ricardo Likens, MD;  Location: [facility];  Service: Urology;  Laterality: Left;   ENTEROSCOPY N/A 09/16/2020   Procedure: PUSH ENTEROSCOPY;  Surgeon: Eartha Angelia Sieving, MD;  [facility]: [facility];  Service: Gastroenterology;  Laterality: N/A;   ENTEROSCOPY N/A 10/25/2020   Procedure: PUSH ENTEROSCOPY;  Surgeon: Eartha Angelia Sieving, MD;  [facility];  Service: Gastroenterology;  Laterality: N/A;   ESOPHAGOGASTRODUODENOSCOPY (EGD) WITH PROPOFOL  N/A 12/16/2019   Procedure: ESOPHAGOGASTRODUODENOSCOPY (EGD) WITH PROPOFOL ;  Surgeon: Shila Gustav GAILS, MD;  [facility];  Service: Endoscopy;  Laterality: N/A;   ESOPHAGOGASTRODUODENOSCOPY (EGD) WITH PROPOFOL  N/A 08/13/2020   Procedure: ESOPHAGOGASTRODUODENOSCOPY (EGD) WITH PROPOFOL ;  Surgeon: Golda Claudis PENNER, MD;  [facility];  Service: Endoscopy;  Laterality: N/A;   ESOPHAGOGASTRODUODENOSCOPY (EGD) WITH PROPOFOL  N/A 09/16/2020   Procedure: ESOPHAGOGASTRODUODENOSCOPY (EGD) WITH PROPOFOL ;  Surgeon: Eartha Angelia Sieving, MD;  [facility];  Service: Gastroenterology;  Laterality: N/A;  1:55   ESOPHAGOGASTRODUODENOSCOPY (EGD) WITH PROPOFOL  N/A 10/25/2020   Procedure: ESOPHAGOGASTRODUODENOSCOPY (EGD) WITH PROPOFOL ;  Surgeon: Eartha Angelia Sieving, MD;  [facility];  Service: Gastroenterology;  Laterality: N/A;  2:00   EYE SURGERY Right may 2015   growth removed, july 2015left eye cataract removed, right eye catarct removed   gamma kniferadiation treatment  Feb 09 2014   [facility]   GIVENS CAPSULE STUDY N/A 10/11/2020   Procedure: GIVENS CAPSULE STUDY;  Surgeon: Eartha Angelia Sieving, MD;  [facility];  Service: Gastroenterology;  Laterality:  N/A;  7:30   HEMOSTASIS CLIP PLACEMENT  12/16/2019   Procedure: HEMOSTASIS CLIP PLACEMENT;  Surgeon: Shila Gustav GAILS, MD;  [facility];  Service: Endoscopy;;   HEMOSTASIS CONTROL  12/16/2019   Procedure: HEMOSTASIS CONTROL;  Surgeon: Shila Gustav GAILS, MD;  [facility]: [facility];  Service: Endoscopy;;  Endoloop   HOLMIUM LASER APPLICATION Left 06/28/2014   Procedure: HOLMIUM LASER APPLICATION;  Surgeon: Ricardo Likens, MD;  Location: [facility];  Service: Urology;  Laterality: Left;   LITHOTRIPSY  years ago   POLYPECTOMY  12/16/2019   Procedure: POLYPECTOMY;  Surgeon: Shila Gustav GAILS, MD;  [facility];  Service: Endoscopy;;   STONE EXTRACTION WITH BASKET Left 06/28/2014   Procedure: STONE EXTRACTION WITH BASKET;  Surgeon: Ricardo Likens, MD;  Location: [facility];  Service: Urology;  Laterality: Left;    Current Outpatient Medications  Medication Sig Dispense Refill   amLODipine  (NORVASC ) 5 MG tablet Take 1 tablet (5 mg total) by mouth daily. 90 tablet 0   apixaban  (ELIQUIS ) 5 MG TABS tablet Take 1 tablet (5 mg total) by mouth 2 (two) times daily. 180 tablet 3   dofetilide  (TIKOSYN ) 500 MCG capsule Take 1 capsule (500 mcg total) by mouth 2 (two) times daily. 180 capsule  3   escitalopram  (LEXAPRO ) 20 MG tablet Take 1 tablet (20 mg total) by mouth daily. 90 tablet 3   FEROSUL 325 (65 Fe) MG tablet TAKE 1 TABLET DAILY (Patient taking differently: Take 325 mg by mouth 2 (two) times daily with a meal.) 90 tablet 0   finasteride  (PROSCAR ) 5 MG tablet Take 1 tablet (5 mg total) by mouth daily. For urine flow 90 tablet 3   gabapentin  (NEURONTIN ) 300 MG capsule TAKE 1 CAPSULE BY MOUTH 3 TIMES A DAY 360 capsule 3   lisinopril  (ZESTRIL ) 5 MG tablet TAKE 1 TABLET DAILY 90 tablet 0   loperamide  (IMODIUM  A-D) 2 MG tablet Take 1 tablet (2 mg total) by mouth 4 (four) times daily as needed for diarrhea or loose stools. (Patient taking differently: Take 2 mg by mouth as needed for diarrhea  or loose stools.) 30 tablet 0   mirtazapine  (REMERON ) 7.5 MG tablet Take 1 tablet (7.5 mg total) by mouth at bedtime. 90 tablet 3   ondansetron  (ZOFRAN -ODT) 4 MG disintegrating tablet Take 1 tablet (4 mg total) by mouth every 8 (eight) hours as needed for nausea. 10 tablet 0   pantoprazole  (PROTONIX ) 40 MG tablet Take 1 tablet (40 mg total) by mouth 2 (two) times daily. 180 tablet 3   pravastatin  (PRAVACHOL ) 40 MG tablet Take 1 tablet (40 mg total) by mouth daily. 90 tablet 3   promethazine  (PHENERGAN ) 25 MG tablet Take 0.5 tablets (12.5 mg total) by mouth every 6 (six) hours as needed for nausea or vomiting. 12 tablet 0   QUEtiapine  (SEROQUEL ) 25 MG tablet Take 1 tablet (25 mg total) by mouth at bedtime. 90 tablet 1   sucralfate  (CARAFATE ) 1 g tablet TAKE 1 TABLET 2 TIMES A DAY 180 tablet 0   tamsulosin  (FLOMAX ) 0.4 MG CAPS capsule Take 2 capsules (0.8 mg total) by mouth at bedtime. For urine flow and prostate 180 capsule 3   vitamin B-12 (CYANOCOBALAMIN ) 1000 MCG tablet Take 1 tablet (1,000 mcg total) by mouth daily. 30 tablet 2   furosemide  (LASIX ) 20 MG tablet Take 1 tablet (20 mg total) by mouth daily. (Patient not taking: Reported on 03/03/2024) 90 tablet 3   No current facility-administered medications for this visit.    Allergies as of 03/03/2024 - Review Complete 03/03/2024  Allergen Reaction Noted   Valsartan Rash 05/07/2014   Morphine  and codeine Other (See Comments) 05/07/2014    Social History   Socioeconomic History   Marital status: Married    Spouse name: Orlean   Number of children: 1   Years of education: Not on file   Highest education level: Not on file  Occupational History   Occupation: retired   Tobacco Use   Smoking status: Never    Passive exposure: Never   Smokeless tobacco: Never  Vaping Use   Vaping status: Never Used  Substance and Sexual Activity   Alcohol use: No   Drug use: No   Sexual activity: Not Currently  Other Topics Concern   Not on  file  Social History Narrative   Lives with wife - they have a basement but don't use it   One daughter - husband is a programmer, multimedia and they go to his church   Patient is paraplegic - uses wheelchair, sometimes motorized   He does still drive using hand controls.   Social Drivers of Health   Financial Resource Strain: Low Risk  (09/10/2023)   Overall Financial Resource Strain (CARDIA)    Difficulty  of Paying Living Expenses: Not hard at all  Food Insecurity: No Food Insecurity (09/10/2023)   Hunger Vital Sign    Worried About Running Out of Food in the Last Year: Never true    Ran Out of Food in the Last Year: Never true  Transportation Needs: No Transportation Needs (09/10/2023)   PRAPARE - Administrator, Civil Service (Medical): No    Lack of Transportation (Non-Medical): No  Physical Activity: Insufficiently Active (09/10/2023)   Exercise Vital Sign    Days of Exercise per Week: 3 days    Minutes of Exercise per Session: 10 min  Stress: No Stress Concern Present (09/10/2023)   [employer] of Occupational Health - Occupational Stress Questionnaire    Feeling of Stress : Not at all  Social Connections: Socially Integrated (09/10/2023)   Social Connection and Isolation Panel    Frequency of Communication with Friends and Family: More than three times a week    Frequency of Social Gatherings with Friends and Family: More than three times a week    Attends Religious Services: More than 4 times per year    Active Member of [employer] or Organizations: Yes    Attends Engineer, Structural: More than 4 times per year    Marital Status: Married    Review of systems General: negative for malaise, night sweats, fever, chills, weight loss Neck: Negative for lumps, goiter, pain and significant neck swelling Resp: Negative for cough, wheezing, dyspnea at rest CV: Negative for chest pain, leg swelling, palpitations, orthopnea GI: denies melena, hematochezia, vomiting,  diarrhea, constipation, dysphagia, odyonophagia, early satiety or unintentional weight loss. +nausea  MSK: Negative for joint pain or swelling, back pain, and muscle pain. Derm: Negative for itching or rash Psych: Denies depression, anxiety, memory loss, confusion. No homicidal or suicidal ideation.  Heme: Negative for prolonged bleeding, bruising easily, and swollen nodes. Endocrine: Negative for cold or heat intolerance, polyuria, polydipsia and goiter. Neuro: negative for tremor, gait imbalance, syncope and seizures. The remainder of the review of systems is noncontributory.  Physical Exam: BP 135/69 (BP Location: Left Arm, Patient Position: Sitting, Cuff Size: Normal)   Pulse 61   Temp 98 F (36.7 C) (Temporal)   Ht 5' 8 (1.727 m)   Wt 150 lb (68 kg) Comment: weight per patient.  BMI 22.81 kg/m  General:   Alert and oriented. No distress noted. Pleasant and cooperative.  Head:  Normocephalic and atraumatic. Eyes:  Conjuctiva clear without scleral icterus. Mouth:  Oral mucosa pink and moist. Good dentition. No lesions. Heart: Normal rate and rhythm, s1 and s2 heart sounds present.  Lungs: Clear lung sounds in all lobes. Respirations equal and unlabored. Abdomen:  +BS, soft, non-tender and non-distended. No rebound or guarding. No HSM or masses noted. Derm: No palmar erythema or jaundice Msk:  contractures noted to bilateral hands  Extremities:  Without edema. Neurologic:  Alert and  oriented x4 Psych:  Alert and cooperative. Normal mood and affect.  Invalid input(s): 6 MONTHS   ASSESSMENT: Theodore Mendoza is a 83 y.o. male presenting today for follow up of IDA and Nausea  Patient reports onset of nausea, diarrhea in October, was seen in the ED earlier last month with cross sectional imaging that did not show a cause for his symptoms. Labs were grossly unremarkable. Hemoglobin at that time was stable. Iron  studies on 11/7 with low iron  sat, he is maintained on BID PO iron ,  reports black stools but thinks this  has been ongoing since being on Iron . He has history of AVMs with extensive endoscopic evaluation as above. Diarrhea has resolved. He is notably taking aleve 2-3 times per week for the past few months. At this time, I am recommending EGD for further evaluation as I cannot rule out PUD, gastritis, duodenitis, AVMs. Indications, risks and benefits of procedure discussed in detail with patient. Patient verbalized understanding and is in agreement to proceed with EGD    PLAN:  EGD-ASA III, needs clearance to hold eliquis  x2 days Continue PO Iron  BID Continue PPI BID Continue carafate  BID Use anti emetics as needed  All questions were answered, patient verbalized understanding and is in agreement with plan as outlined above.   Follow Up: 3 months   Andrej Spagnoli L. Mariette, MSN, APRN, AGNP-C Adult-Gerontology Nurse Practitioner [facility]  I have reviewed the note and agree with the APP's assessment as described in this progress note  Toribio Fortune, MD Gastroenterology and Hepatology [facility]

## 2024-03-03 NOTE — Telephone Encounter (Signed)
 Hold eliquis  for 3 days prior.Otherwise okay for EGD procedure

## 2024-03-03 NOTE — Telephone Encounter (Signed)
    03/03/24  Theodore Mendoza Apr 29, 1940  What type of surgery is being performed? EGD  When is surgery scheduled? To be determined  What type of clearance is required (medical or pharmacy to hold medication or both? medication  Are there any medications that need to be held prior to surgery and how long? Eliquis , hold 2 days prior  Name of physician performing surgery?  Dr. Eartha Rouse Gastroenterology at [facility] Phone: [phone] Fax: [phone]  Anethesia type (none, local, MAC, general)? MAC     ? Yes ? No Patient can hold medication as requested   Signature: ___________________________

## 2024-03-03 NOTE — Progress Notes (Addendum)
 Referring Provider: Zollie Lowers, MD Primary Care Physician:  Zollie Lowers, MD Primary GI Physician: Dr. Eartha   Chief Complaint  Patient presents with   Follow-up    Patient here today for a follow up on IDA. Patient says he has nausea and has had a recent Ct scan of abdomen on 02/03/2024 at recent Ed visit at [facility]. He reports he has issues with dark stools, denies any bright red blood per rectum. He has occasional shortness of breath, denies any issues with dizziness or chest pain. 02/14/2024 last hgb was 13.1, Fe sat 17% on 02/07/2024. He is taking Ferrous sulfate  bid.    HPI:   Theodore Mendoza is a 83 y.o. male with past medical history of brain tumor, afib on Eliquis , Illene Shine and heart failure,    Patient presenting today for:  IDA, nausea   Last seen December 2024, by Dr. Eartha, at that time taking PO iron  BID, feeling well. Hgb 11.3  Recommended CBC, Iron  studies, continue PO iron  BID  -Recent ED visit for n/v/d -CT Angio chest/abd/pelvis No acute intra-abdominal finding that would suggest etiology to the patient's symptoms of nausea, vomiting, diarrhea. No abnormal abdominal or pelvic fluid collection. No abdominal or pelvic adenopathy.  Labs on 11/14 with hgb 13.1 lipase 18 CMP normal  Iron  studies on 11/7 with iron  56  TIBC 325 sat 17 ferritin 38  Present:  States he began having nausea in October which prompted an ED visit, was told everything looked ok, given some nausea meds which have helped. He reports nausea is only occasionally. No abdominal pain. Previously having watery diarrhea but this has resolved. He denies any BRB in stools, has black stools but is taking iron  pills BID, he reports this is not new. Appetite is good. Weight is stable. He feels some foods he eats may bring on the nausea. He is taking carafate  given to him by PCP which seems to help some. Denies any GERD symptoms, he is maintained on protonix  40mg  daily. Denies any early  satiety or fullness. No new medications or antibiotics recently. He is taking aleve 2-3 times per week for the last few months.    Last EGD: 08/13/2020 - Normal hypopharynx. - Normal esophagus. - Z- line irregular, 41 cm from the incisors. - Two gastric polyps. - Two clips on place at previous polypectomy site without stigmata of bleed. - Normal duodenal bulb, second portion of the duodenum and third portion of the duodenum.   Capsule endoscopy 10/11/2020 - 1 nonbleeding AVM in the duodenum   First push enteroscopy 09/16/2020 Endo Clip found in the gastric body, normal duodenum and jejunum   Second push enteroscopy 10/25/2020, normal esophagus, stomach, duodenum and jejunum   Last Colonoscopy: 08/14/2020 Diverticulosis, internal and external hemorrhoids  Filed Weights   03/03/24 1327  Weight: 150 lb (68 kg)     Past Medical History:  Diagnosis Date   AAA (abdominal aortic aneurysm)    Atrial fibrillation (HCC)    Brain tumor (HCC)    x2 (benign per pt)   Cancer (HCC)    melanoma removed twice (head and right forearm)   Cardiac arrhythmia    CHF (congestive heart failure) (HCC)    GERD (gastroesophageal reflux disease)    Guillain-Barre syndrome 02/13/1986   Headache    Hypertension    Kidney stones    Myocardial infarction [facility]) 2007    Past Surgical History:  Procedure Laterality Date   ABDOMINAL AORTIC ENDOVASCULAR STENT GRAFT  Bilateral 04/04/2022   Procedure: ABDOMINAL AORTIC ENDOVASCULAR STENT GRAFT;  Surgeon: Serene Gaile ORN, MD;  Location: [facility] OR;  Service: Vascular;  Laterality: Bilateral;   COLONOSCOPY WITH PROPOFOL  N/A 08/14/2020   Procedure: COLONOSCOPY WITH PROPOFOL ;  Surgeon: Golda Claudis PENNER, MD;  [facility];  Service: Endoscopy;  Laterality: N/A;   CORONARY ANGIOPLASTY  1997   after mi   CYSTOSCOPY WITH RETROGRADE PYELOGRAM, URETEROSCOPY AND STENT PLACEMENT Left 06/28/2014   Procedure: 1ST STAGE CYSTOSCOPY/URETEROSCOPY/STENT PLACEMENT;  Surgeon:  Ricardo Likens, MD;  Location: [facility];  Service: Urology;  Laterality: Left;   CYSTOSCOPY WITH STENT PLACEMENT Left 05/08/2014   Procedure: CYSTOSCOPY, RETROGRADE PYELOGRAM WITH LEFT URETERAL STENT PLACEMENT;  Surgeon: Ricardo Likens, MD;  Location: [facility];  Service: Urology;  Laterality: Left;   ENTEROSCOPY N/A 09/16/2020   Procedure: PUSH ENTEROSCOPY;  Surgeon: Eartha Angelia Sieving, MD;  [facility]: [facility];  Service: Gastroenterology;  Laterality: N/A;   ENTEROSCOPY N/A 10/25/2020   Procedure: PUSH ENTEROSCOPY;  Surgeon: Eartha Angelia Sieving, MD;  [facility];  Service: Gastroenterology;  Laterality: N/A;   ESOPHAGOGASTRODUODENOSCOPY (EGD) WITH PROPOFOL  N/A 12/16/2019   Procedure: ESOPHAGOGASTRODUODENOSCOPY (EGD) WITH PROPOFOL ;  Surgeon: Shila Gustav GAILS, MD;  [facility]: [facility];  Service: Endoscopy;  Laterality: N/A;   ESOPHAGOGASTRODUODENOSCOPY (EGD) WITH PROPOFOL  N/A 08/13/2020   Procedure: ESOPHAGOGASTRODUODENOSCOPY (EGD) WITH PROPOFOL ;  Surgeon: Golda Claudis PENNER, MD;  [facility];  Service: Endoscopy;  Laterality: N/A;   ESOPHAGOGASTRODUODENOSCOPY (EGD) WITH PROPOFOL  N/A 09/16/2020   Procedure: ESOPHAGOGASTRODUODENOSCOPY (EGD) WITH PROPOFOL ;  Surgeon: Eartha Angelia Sieving, MD;  [facility];  Service: Gastroenterology;  Laterality: N/A;  1:55   ESOPHAGOGASTRODUODENOSCOPY (EGD) WITH PROPOFOL  N/A 10/25/2020   Procedure: ESOPHAGOGASTRODUODENOSCOPY (EGD) WITH PROPOFOL ;  Surgeon: Eartha Angelia Sieving, MD;  [facility];  Service: Gastroenterology;  Laterality: N/A;  2:00   EYE SURGERY Right may 2015   growth removed, july 2015left eye cataract removed, right eye catarct removed   gamma kniferadiation treatment  Feb 09 2014   [facility]   GIVENS CAPSULE STUDY N/A 10/11/2020   Procedure: GIVENS CAPSULE STUDY;  Surgeon: Eartha Angelia Sieving, MD;  [facility]: [facility];  Service: Gastroenterology;  Laterality:  N/A;  7:30   HEMOSTASIS CLIP PLACEMENT  12/16/2019   Procedure: HEMOSTASIS CLIP PLACEMENT;  Surgeon: Shila Gustav GAILS, MD;  [facility]: [facility];  Service: Endoscopy;;   HEMOSTASIS CONTROL  12/16/2019   Procedure: HEMOSTASIS CONTROL;  Surgeon: Shila Gustav GAILS, MD;  Location: [facility];  Service: Endoscopy;;  Endoloop   HOLMIUM LASER APPLICATION Left 06/28/2014   Procedure: HOLMIUM LASER APPLICATION;  Surgeon: Ricardo Likens, MD;  Location: [facility];  Service: Urology;  Laterality: Left;   LITHOTRIPSY  years ago   POLYPECTOMY  12/16/2019   Procedure: POLYPECTOMY;  Surgeon: Shila Gustav GAILS, MD;  [facility]: [facility];  Service: Endoscopy;;   STONE EXTRACTION WITH BASKET Left 06/28/2014   Procedure: STONE EXTRACTION WITH BASKET;  Surgeon: Ricardo Likens, MD;  Location: [facility];  Service: Urology;  Laterality: Left;    Current Outpatient Medications  Medication Sig Dispense Refill   amLODipine  (NORVASC ) 5 MG tablet Take 1 tablet (5 mg total) by mouth daily. 90 tablet 0   apixaban  (ELIQUIS ) 5 MG TABS tablet Take 1 tablet (5 mg total) by mouth 2 (two) times daily. 180 tablet 3   dofetilide  (TIKOSYN ) 500 MCG capsule Take 1 capsule (500 mcg total) by mouth 2 (two) times daily. 180 capsule  3   escitalopram  (LEXAPRO ) 20 MG tablet Take 1 tablet (20 mg total) by mouth daily. 90 tablet 3   FEROSUL 325 (65 Fe) MG tablet TAKE 1 TABLET DAILY (Patient taking differently: Take 325 mg by mouth 2 (two) times daily with a meal.) 90 tablet 0   finasteride  (PROSCAR ) 5 MG tablet Take 1 tablet (5 mg total) by mouth daily. For urine flow 90 tablet 3   gabapentin  (NEURONTIN ) 300 MG capsule TAKE 1 CAPSULE BY MOUTH 3 TIMES A DAY 360 capsule 3   lisinopril  (ZESTRIL ) 5 MG tablet TAKE 1 TABLET DAILY 90 tablet 0   loperamide  (IMODIUM  A-D) 2 MG tablet Take 1 tablet (2 mg total) by mouth 4 (four) times daily as needed for diarrhea or loose stools. (Patient taking differently: Take 2 mg by mouth as needed for diarrhea  or loose stools.) 30 tablet 0   mirtazapine  (REMERON ) 7.5 MG tablet Take 1 tablet (7.5 mg total) by mouth at bedtime. 90 tablet 3   ondansetron  (ZOFRAN -ODT) 4 MG disintegrating tablet Take 1 tablet (4 mg total) by mouth every 8 (eight) hours as needed for nausea. 10 tablet 0   pantoprazole  (PROTONIX ) 40 MG tablet Take 1 tablet (40 mg total) by mouth 2 (two) times daily. 180 tablet 3   pravastatin  (PRAVACHOL ) 40 MG tablet Take 1 tablet (40 mg total) by mouth daily. 90 tablet 3   promethazine  (PHENERGAN ) 25 MG tablet Take 0.5 tablets (12.5 mg total) by mouth every 6 (six) hours as needed for nausea or vomiting. 12 tablet 0   QUEtiapine  (SEROQUEL ) 25 MG tablet Take 1 tablet (25 mg total) by mouth at bedtime. 90 tablet 1   sucralfate  (CARAFATE ) 1 g tablet TAKE 1 TABLET 2 TIMES A DAY 180 tablet 0   tamsulosin  (FLOMAX ) 0.4 MG CAPS capsule Take 2 capsules (0.8 mg total) by mouth at bedtime. For urine flow and prostate 180 capsule 3   vitamin B-12 (CYANOCOBALAMIN ) 1000 MCG tablet Take 1 tablet (1,000 mcg total) by mouth daily. 30 tablet 2   furosemide  (LASIX ) 20 MG tablet Take 1 tablet (20 mg total) by mouth daily. (Patient not taking: Reported on 03/03/2024) 90 tablet 3   No current facility-administered medications for this visit.    Allergies as of 03/03/2024 - Review Complete 03/03/2024  Allergen Reaction Noted   Valsartan Rash 05/07/2014   Morphine  and codeine Other (See Comments) 05/07/2014    Social History   Socioeconomic History   Marital status: Married    Spouse name: Orlean   Number of children: 1   Years of education: Not on file   Highest education level: Not on file  Occupational History   Occupation: retired   Tobacco Use   Smoking status: Never    Passive exposure: Never   Smokeless tobacco: Never  Vaping Use   Vaping status: Never Used  Substance and Sexual Activity   Alcohol use: No   Drug use: No   Sexual activity: Not Currently  Other Topics Concern   Not on  file  Social History Narrative   Lives with wife - they have a basement but don't use it   One daughter - husband is a programmer, multimedia and they go to his church   Patient is paraplegic - uses wheelchair, sometimes motorized   He does still drive using hand controls.   Social Drivers of Health   Financial Resource Strain: Low Risk  (09/10/2023)   Overall Financial Resource Strain (CARDIA)    Difficulty  of Paying Living Expenses: Not hard at all  Food Insecurity: No Food Insecurity (09/10/2023)   Hunger Vital Sign    Worried About Running Out of Food in the Last Year: Never true    Ran Out of Food in the Last Year: Never true  Transportation Needs: No Transportation Needs (09/10/2023)   PRAPARE - Administrator, Civil Service (Medical): No    Lack of Transportation (Non-Medical): No  Physical Activity: Insufficiently Active (09/10/2023)   Exercise Vital Sign    Days of Exercise per Week: 3 days    Minutes of Exercise per Session: 10 min  Stress: No Stress Concern Present (09/10/2023)   [employer] of Occupational Health - Occupational Stress Questionnaire    Feeling of Stress : Not at all  Social Connections: Socially Integrated (09/10/2023)   Social Connection and Isolation Panel    Frequency of Communication with Friends and Family: More than three times a week    Frequency of Social Gatherings with Friends and Family: More than three times a week    Attends Religious Services: More than 4 times per year    Active Member of [employer] or Organizations: Yes    Attends Engineer, Structural: More than 4 times per year    Marital Status: Married    Review of systems General: negative for malaise, night sweats, fever, chills, weight loss Neck: Negative for lumps, goiter, pain and significant neck swelling Resp: Negative for cough, wheezing, dyspnea at rest CV: Negative for chest pain, leg swelling, palpitations, orthopnea GI: denies melena, hematochezia, vomiting,  diarrhea, constipation, dysphagia, odyonophagia, early satiety or unintentional weight loss. +nausea  MSK: Negative for joint pain or swelling, back pain, and muscle pain. Derm: Negative for itching or rash Psych: Denies depression, anxiety, memory loss, confusion. No homicidal or suicidal ideation.  Heme: Negative for prolonged bleeding, bruising easily, and swollen nodes. Endocrine: Negative for cold or heat intolerance, polyuria, polydipsia and goiter. Neuro: negative for tremor, gait imbalance, syncope and seizures. The remainder of the review of systems is noncontributory.  Physical Exam: BP 135/69 (BP Location: Left Arm, Patient Position: Sitting, Cuff Size: Normal)   Pulse 61   Temp 98 F (36.7 C) (Temporal)   Ht 5' 8 (1.727 m)   Wt 150 lb (68 kg) Comment: weight per patient.  BMI 22.81 kg/m  General:   Alert and oriented. No distress noted. Pleasant and cooperative.  Head:  Normocephalic and atraumatic. Eyes:  Conjuctiva clear without scleral icterus. Mouth:  Oral mucosa pink and moist. Good dentition. No lesions. Heart: Normal rate and rhythm, s1 and s2 heart sounds present.  Lungs: Clear lung sounds in all lobes. Respirations equal and unlabored. Abdomen:  +BS, soft, non-tender and non-distended. No rebound or guarding. No HSM or masses noted. Derm: No palmar erythema or jaundice Msk:  contractures noted to bilateral hands  Extremities:  Without edema. Neurologic:  Alert and  oriented x4 Psych:  Alert and cooperative. Normal mood and affect.  Invalid input(s): 6 MONTHS   ASSESSMENT: Theodore Mendoza is a 83 y.o. male presenting today for follow up of IDA and Nausea  Patient reports onset of nausea, diarrhea in October, was seen in the ED earlier last month with cross sectional imaging that did not show a cause for his symptoms. Labs were grossly unremarkable. Hemoglobin at that time was stable. Iron  studies on 11/7 with low iron  sat, he is maintained on BID PO iron ,  reports black stools but thinks this  has been ongoing since being on Iron . He has history of AVMs with extensive endoscopic evaluation as above. Diarrhea has resolved. He is notably taking aleve 2-3 times per week for the past few months. At this time, I am recommending EGD for further evaluation as I cannot rule out PUD, gastritis, duodenitis, AVMs. Indications, risks and benefits of procedure discussed in detail with patient. Patient verbalized understanding and is in agreement to proceed with EGD    PLAN:  EGD-ASA III, needs clearance to hold eliquis  x2 days Continue PO Iron  BID Continue PPI BID Continue carafate  BID Use anti emetics as needed  All questions were answered, patient verbalized understanding and is in agreement with plan as outlined above.   Follow Up: 3 months   Andrej Spagnoli L. Mariette, MSN, APRN, AGNP-C Adult-Gerontology Nurse Practitioner [facility]  I have reviewed the note and agree with the APP's assessment as described in this progress note  Toribio Fortune, MD Gastroenterology and Hepatology [facility]

## 2024-03-04 ENCOUNTER — Inpatient Hospital Stay: Attending: Oncology | Admitting: Oncology

## 2024-03-04 DIAGNOSIS — D509 Iron deficiency anemia, unspecified: Secondary | ICD-10-CM | POA: Insufficient documentation

## 2024-03-04 DIAGNOSIS — Z801 Family history of malignant neoplasm of trachea, bronchus and lung: Secondary | ICD-10-CM | POA: Diagnosis not present

## 2024-03-04 DIAGNOSIS — D508 Other iron deficiency anemias: Secondary | ICD-10-CM | POA: Diagnosis not present

## 2024-03-04 NOTE — Telephone Encounter (Signed)
 PA on [employer] for EGD: Prior Authorization not required.

## 2024-03-04 NOTE — Progress Notes (Unsigned)
 [facility] [address] [place] [address]   Clinic Day:  03/04/2024  Referring physician: Zollie Lowers, MD  Patient Care Team: Zollie Lowers, MD as PCP - General (Family Medicine) Rosine Yancy ORN as Consulting Physician (Neurosurgery) Alvaro Ricardo KATHEE Raddle., MD as Consulting Physician (Urology) Billee Mliss BIRCH, RPH-CPP (Pharmacist) Vicci Mcardle, OD (Optometry)   ASSESSMENT & PLAN:   Assessment:  1.  Iron  deficiency anemia: - He has been on iron  tablet twice daily for 6 months to a year.  Last transfusion in 1987. - CBC on 04/02/2023: Hb-11.7.  Last ferritin was 29 on 03/04/2023. - Denies any BRBPR/melena.  He is on Eliquis  for couple of years.  2.  Social/family history: - Lives at home with his wife.  Non-smoker. - Brother died of lung cancer.  Mother had lung cancer.  Plan:  1.  Iron  deficiency anemia: -Hematology workup from 04/15/2023 was essentially unremarkable except for low iron  levels.  -He is currently on oral iron  supplements.   -Labs from 02/14/2024 show hemoglobin of 12.9 (13.1), iron  saturation 17% (20%) and unremarkable differential. -Denies any melena, hematochezia or bright red blood per rectum. - We discussed trying additional IV iron  versus oral iron .  He received a dose of INFeD  back on 12/16/2019.  Tolerated well.  Since then he has continued iron  tablets.  Unfortunately, lab work has trended down. -Return to clinic in 3 months with labs a few days before.   Orders Placed This Encounter  Procedures   Iron  and TIBC ([facility] ONLY)    Standing Status:   Future    Expected Date:   06/02/2024    Expiration Date:   08/31/2024   Ferritin    Standing Status:   Future    Expected Date:   06/02/2024    Expiration Date:   08/31/2024   CBC with Differential/Platelet    Standing Status:   Future    Expected Date:   06/02/2024    Expiration Date:   08/31/2024   Comprehensive metabolic panel    Standing Status:   Future     Expected Date:   06/02/2024    Expiration Date:   08/31/2024    Delon FORBES Hope, NP   12/3/20251:10 PM  CHIEF COMPLAINT/PURPOSE OF CONSULT:   Diagnosis: Iron  deficiency anemia  Current Therapy: Oral iron    HISTORY OF PRESENT ILLNESS:   Theodore Mendoza is a 83 y.o. male presenting to clinic today for follow-up for iron  deficiency anemia.    He continues oral iron  twice a day with vitamin C with good tolerance.  He received 1 dose of 1000 mg INFeD  on 12/16/2019.  He presents back today to review most recent lab work.  In the interim, patient was seen ED for history of AAA with type II endoleak which had slightly worsened prompting his visit.  He complained of abdominal pain and nausea.  Nausea was controlled and he was instructed to follow-up outpatient.  Patient was evaluated on 11/03/2023 for a fall.  Reports wife lost control of the wheelchair and fell down 5 flights of stairs.  Since his visit, he denies any hospitalizations, surgeries or changes to his baseline health.  Reports appetite at 100% energy levels of 0%.  Denies any pain.  Has occasional shortness of breath and increased urinary frequency at night. He denies any bleeding, melena, hematochezia or bright red blood per rectum.  He uses a wheelchair.    PAST MEDICAL HISTORY:   Past Medical History:  Past Medical History:  Diagnosis Date   AAA (abdominal aortic aneurysm)    Atrial fibrillation (HCC)    Brain tumor (HCC)    x2 (benign per pt)   Cancer (HCC)    melanoma removed twice (head and right forearm)   Cardiac arrhythmia    CHF (congestive heart failure) (HCC)    GERD (gastroesophageal reflux disease)    Guillain-Barre syndrome 02/13/1986   Headache    Hypertension    Kidney stones    Myocardial infarction [facility]) 2007    Surgical History: Past Surgical History:  Procedure Laterality Date   ABDOMINAL AORTIC ENDOVASCULAR STENT GRAFT Bilateral 04/04/2022   Procedure: ABDOMINAL AORTIC ENDOVASCULAR STENT GRAFT;  Surgeon:  Serene Gaile ORN, MD;  Location: [facility] OR;  Service: Vascular;  Laterality: Bilateral;   COLONOSCOPY WITH PROPOFOL  N/A 08/14/2020   Procedure: COLONOSCOPY WITH PROPOFOL ;  Surgeon: Golda Claudis PENNER, MD;  Location: [facility];  Service: Endoscopy;  Laterality: N/A;   CORONARY ANGIOPLASTY  1997   after mi   CYSTOSCOPY WITH RETROGRADE PYELOGRAM, URETEROSCOPY AND STENT PLACEMENT Left 06/28/2014   Procedure: 1ST STAGE CYSTOSCOPY/URETEROSCOPY/STENT PLACEMENT;  Surgeon: Ricardo Likens, MD;  Location: [facility];  Service: Urology;  Laterality: Left;   CYSTOSCOPY WITH STENT PLACEMENT Left 05/08/2014   Procedure: CYSTOSCOPY, RETROGRADE PYELOGRAM WITH LEFT URETERAL STENT PLACEMENT;  Surgeon: Ricardo Likens, MD;  Location: [facility];  Service: Urology;  Laterality: Left;   ENTEROSCOPY N/A 09/16/2020   Procedure: PUSH ENTEROSCOPY;  Surgeon: Eartha Angelia Sieving, MD;  [facility];  Service: Gastroenterology;  Laterality: N/A;   ENTEROSCOPY N/A 10/25/2020   Procedure: PUSH ENTEROSCOPY;  Surgeon: Eartha Angelia Sieving, MD;  [facility];  Service: Gastroenterology;  Laterality: N/A;   ESOPHAGOGASTRODUODENOSCOPY (EGD) WITH PROPOFOL  N/A 12/16/2019   Procedure: ESOPHAGOGASTRODUODENOSCOPY (EGD) WITH PROPOFOL ;  Surgeon: Shila Gustav GAILS, MD;  [facility]: [facility];  Service: Endoscopy;  Laterality: N/A;   ESOPHAGOGASTRODUODENOSCOPY (EGD) WITH PROPOFOL  N/A 08/13/2020   Procedure: ESOPHAGOGASTRODUODENOSCOPY (EGD) WITH PROPOFOL ;  Surgeon: Golda Claudis PENNER, MD;  [facility];  Service: Endoscopy;  Laterality: N/A;   ESOPHAGOGASTRODUODENOSCOPY (EGD) WITH PROPOFOL  N/A 09/16/2020   Procedure: ESOPHAGOGASTRODUODENOSCOPY (EGD) WITH PROPOFOL ;  Surgeon: Eartha Angelia Sieving, MD;  [facility];  Service: Gastroenterology;  Laterality: N/A;  1:55   ESOPHAGOGASTRODUODENOSCOPY (EGD) WITH PROPOFOL  N/A 10/25/2020   Procedure: ESOPHAGOGASTRODUODENOSCOPY (EGD) WITH PROPOFOL ;  Surgeon:  Eartha Angelia Sieving, MD;  [facility];  Service: Gastroenterology;  Laterality: N/A;  2:00   EYE SURGERY Right may 2015   growth removed, july 2015left eye cataract removed, right eye catarct removed   gamma kniferadiation treatment  Feb 09 2014   [facility]   GIVENS CAPSULE STUDY N/A 10/11/2020   Procedure: GIVENS CAPSULE STUDY;  Surgeon: Eartha Angelia Sieving, MD;  [facility];  Service: Gastroenterology;  Laterality: N/A;  7:30   HEMOSTASIS CLIP PLACEMENT  12/16/2019   Procedure: HEMOSTASIS CLIP PLACEMENT;  Surgeon: Shila Gustav GAILS, MD;  [facility]: [facility];  Service: Endoscopy;;   HEMOSTASIS CONTROL  12/16/2019   Procedure: HEMOSTASIS CONTROL;  Surgeon: Shila Gustav GAILS, MD;  [facility]: [facility];  Service: Endoscopy;;  Endoloop   HOLMIUM LASER APPLICATION Left 06/28/2014   Procedure: HOLMIUM LASER APPLICATION;  Surgeon: Ricardo Likens, MD;  Location: [facility];  Service: Urology;  Laterality: Left;   LITHOTRIPSY  years ago   POLYPECTOMY  12/16/2019   Procedure: POLYPECTOMY;  Surgeon: Shila Gustav GAILS, MD;  Location[facility];  Service: Endoscopy;;   STONE EXTRACTION WITH BASKET Left 06/28/2014   Procedure: STONE EXTRACTION WITH BASKET;  Surgeon: Ricardo Likens, MD;  [facility];  Service: Urology;  Laterality: Left;    Social History: Social History   Socioeconomic History   Marital status: Married    Spouse name: Orlean   Number of children: 1   Years of education: Not on file   Highest education level: Not on file  Occupational History   Occupation: retired   Tobacco Use   Smoking status: Never    Passive exposure: Never   Smokeless tobacco: Never  Vaping Use   Vaping status: Never Used  Substance and Sexual Activity   Alcohol use: No   Drug use: No   Sexual activity: Not Currently  Other Topics Concern   Not on file  Social History Narrative   Lives with wife - they have a basement but don't use it    One daughter - husband is a programmer, multimedia and they go to his church   Patient is paraplegic - uses wheelchair, sometimes motorized   He does still drive using hand controls.   Social Drivers of Corporate Investment Banker Strain: Low Risk  (09/10/2023)   Overall Financial Resource Strain (CARDIA)    Difficulty of Paying Living Expenses: Not hard at all  Food Insecurity: No Food Insecurity (09/10/2023)   Hunger Vital Sign    Worried About Running Out of Food in the Last Year: Never true    Ran Out of Food in the Last Year: Never true  Transportation Needs: No Transportation Needs (09/10/2023)   PRAPARE - Administrator, Civil Service (Medical): No    Lack of Transportation (Non-Medical): No  Physical Activity: Insufficiently Active (09/10/2023)   Exercise Vital Sign    Days of Exercise per Week: 3 days    Minutes of Exercise per Session: 10 min  Stress: No Stress Concern Present (09/10/2023)   [employer] of Occupational Health - Occupational Stress Questionnaire    Feeling of Stress : Not at all  Social Connections: Socially Integrated (09/10/2023)   Social Connection and Isolation Panel    Frequency of Communication with Friends and Family: More than three times a week    Frequency of Social Gatherings with Friends and Family: More than three times a week    Attends Religious Services: More than 4 times per year    Active Member of [employer] or Organizations: Yes    Attends Engineer, Structural: More than 4 times per year    Marital Status: Married  Catering Manager Violence: Not At Risk (09/10/2023)   Humiliation, Afraid, Rape, and Kick questionnaire    Fear of Current or Ex-Partner: No    Emotionally Abused: No    Physically Abused: No    Sexually Abused: No    Family History: Family History  Problem Relation Age of Onset   Diabetes Mother    Lung cancer Mother    CAD Father    Diabetes Brother    Diabetes Sister    Stroke Sister    Other Maternal  Grandfather        brain tumor   Colon cancer Neg Hx    Pancreatic cancer Neg Hx    Esophageal cancer Neg Hx     Current Medications:  Current Outpatient Medications:    amLODipine  (NORVASC ) 5 MG tablet, Take 1 tablet (5 mg total) by mouth daily., Disp: 90 tablet, Rfl: 0  apixaban  (ELIQUIS ) 5 MG TABS tablet, Take 1 tablet (5 mg total) by mouth 2 (two) times daily., Disp: 180 tablet, Rfl: 3   dofetilide  (TIKOSYN ) 500 MCG capsule, Take 1 capsule (500 mcg total) by mouth 2 (two) times daily., Disp: 180 capsule, Rfl: 3   escitalopram  (LEXAPRO ) 20 MG tablet, Take 1 tablet (20 mg total) by mouth daily., Disp: 90 tablet, Rfl: 3   FEROSUL 325 (65 Fe) MG tablet, TAKE 1 TABLET DAILY (Patient taking differently: Take 325 mg by mouth 2 (two) times daily with a meal.), Disp: 90 tablet, Rfl: 0   finasteride  (PROSCAR ) 5 MG tablet, Take 1 tablet (5 mg total) by mouth daily. For urine flow, Disp: 90 tablet, Rfl: 3   furosemide  (LASIX ) 20 MG tablet, Take 1 tablet (20 mg total) by mouth daily. (Patient not taking: Reported on 03/03/2024), Disp: 90 tablet, Rfl: 3   gabapentin  (NEURONTIN ) 300 MG capsule, TAKE 1 CAPSULE BY MOUTH 3 TIMES A DAY, Disp: 360 capsule, Rfl: 3   lisinopril  (ZESTRIL ) 5 MG tablet, TAKE 1 TABLET DAILY, Disp: 90 tablet, Rfl: 0   loperamide  (IMODIUM  A-D) 2 MG tablet, Take 1 tablet (2 mg total) by mouth 4 (four) times daily as needed for diarrhea or loose stools. (Patient taking differently: Take 2 mg by mouth as needed for diarrhea or loose stools.), Disp: 30 tablet, Rfl: 0   mirtazapine  (REMERON ) 7.5 MG tablet, Take 1 tablet (7.5 mg total) by mouth at bedtime., Disp: 90 tablet, Rfl: 3   ondansetron  (ZOFRAN -ODT) 4 MG disintegrating tablet, Take 1 tablet (4 mg total) by mouth every 8 (eight) hours as needed for nausea., Disp: 10 tablet, Rfl: 0   pantoprazole  (PROTONIX ) 40 MG tablet, Take 1 tablet (40 mg total) by mouth 2 (two) times daily., Disp: 180 tablet, Rfl: 3   pravastatin  (PRAVACHOL ) 40  MG tablet, Take 1 tablet (40 mg total) by mouth daily., Disp: 90 tablet, Rfl: 3   promethazine  (PHENERGAN ) 25 MG tablet, Take 0.5 tablets (12.5 mg total) by mouth every 6 (six) hours as needed for nausea or vomiting., Disp: 12 tablet, Rfl: 0   QUEtiapine  (SEROQUEL ) 25 MG tablet, Take 1 tablet (25 mg total) by mouth at bedtime., Disp: 90 tablet, Rfl: 1   sucralfate  (CARAFATE ) 1 g tablet, TAKE 1 TABLET 2 TIMES A DAY, Disp: 180 tablet, Rfl: 0   tamsulosin  (FLOMAX ) 0.4 MG CAPS capsule, Take 2 capsules (0.8 mg total) by mouth at bedtime. For urine flow and prostate, Disp: 180 capsule, Rfl: 3   vitamin B-12 (CYANOCOBALAMIN ) 1000 MCG tablet, Take 1 tablet (1,000 mcg total) by mouth daily., Disp: 30 tablet, Rfl: 2   Allergies: Allergies  Allergen Reactions   Valsartan Rash   Morphine  And Codeine Other (See Comments)    Reaction:  Confusion/weakness/hallucinations    REVIEW OF SYSTEMS:   Review of Systems  Constitutional:  Positive for appetite change and fatigue.  Respiratory:  Positive for shortness of breath.   Genitourinary:  Positive for frequency.   Psychiatric/Behavioral:  Positive for sleep disturbance.      VITALS:   Blood pressure 98/60, pulse 62, temperature 97.7 F (36.5 C), temperature source Oral, resp. rate 18, SpO2 98%.  Wt Readings from Last 3 Encounters:  03/03/24 150 lb (68 kg)  12/11/23 149 lb (67.6 kg)  11/25/23 149 lb (67.6 kg)    There is no height or weight on file to calculate BMI.   PHYSICAL EXAM:   Physical Exam Constitutional:  Appearance: Normal appearance.  Cardiovascular:     Rate and Rhythm: Normal rate and regular rhythm.  Pulmonary:     Effort: Pulmonary effort is normal.     Breath sounds: Normal breath sounds.  Abdominal:     General: Bowel sounds are normal.     Palpations: Abdomen is soft.  Musculoskeletal:        General: No swelling. Normal range of motion.  Neurological:     Mental Status: He is alert and oriented to person,  place, and time. Mental status is at baseline.     LABS:   CBC    Component Value Date/Time   WBC 8.5 02/14/2024 1432   RBC 4.42 02/14/2024 1432   HGB 12.9 (L) 02/14/2024 1439   HGB 10.8 (L) 03/19/2023 1218   HCT 38.0 (L) 02/14/2024 1439   HCT 34.4 (L) 03/19/2023 1218   PLT 166 02/14/2024 1432   PLT 128 (L) 03/19/2023 1218   MCV 90.7 02/14/2024 1432   MCV 93 03/19/2023 1218   MCH 29.6 02/14/2024 1432   MCHC 32.7 02/14/2024 1432   RDW 13.4 02/14/2024 1432   RDW 13.0 03/19/2023 1218   LYMPHSABS 0.8 02/14/2024 1432   LYMPHSABS 1.0 03/19/2023 1218   MONOABS 0.3 02/14/2024 1432   EOSABS 0.0 02/14/2024 1432   EOSABS 0.2 03/19/2023 1218   BASOSABS 0.1 02/14/2024 1432   BASOSABS 0.1 03/19/2023 1218    CMP    Component Value Date/Time   NA 140 02/14/2024 1439   NA 149 (H) 03/19/2023 1218   K 3.7 02/14/2024 1439   CL 105 02/14/2024 1439   CO2 23 02/14/2024 1432   GLUCOSE 88 02/14/2024 1439   BUN 21 02/14/2024 1439   BUN 11 03/19/2023 1218   CREATININE 0.80 02/14/2024 1439   CALCIUM 9.1 02/14/2024 1432   PROT 6.1 (L) 02/14/2024 1432   PROT 5.7 (L) 03/19/2023 1218   ALBUMIN 4.0 02/14/2024 1432   ALBUMIN 3.9 03/19/2023 1218   AST 17 02/14/2024 1432   ALT 8 02/14/2024 1432   ALKPHOS 56 02/14/2024 1432   BILITOT 1.8 (H) 02/14/2024 1432   BILITOT 0.5 03/19/2023 1218   GFRNONAA >60 02/14/2024 1432   GFRAA 97 05/06/2020 1057    No results found for: CEA1, CEA / No results found for: CEA1, CEA Lab Results  Component Value Date   PSA1 3.8 02/25/2023   No results found for: CAN199 No results found for: [id number]  Lab Results  Component Value Date   TOTALPROTELP 5.9 (L) 04/15/2023   TOTALPROTELP 5.8 (L) 04/15/2023   ALBUMINELP 3.9 04/15/2023   A1GS 0.2 04/15/2023   A2GS 0.4 04/15/2023   BETS 0.7 04/15/2023   GAMS 0.7 04/15/2023   MSPIKE Not Observed 04/15/2023   SPEI Comment 04/15/2023   Lab Results  Component Value Date   TIBC 325 02/07/2024   TIBC  308 09/05/2023   TIBC 335 08/02/2023   FERRITIN 38 02/07/2024   FERRITIN 31 09/05/2023   FERRITIN 29 08/02/2023   IRONPCTSAT 17 (L) 02/07/2024   IRONPCTSAT 26 09/05/2023   IRONPCTSAT 20 08/02/2023   Lab Results  Component Value Date   LDH 116 04/15/2023     STUDIES:   CT Angio Chest/Abd/Pel for Dissection W and/or Wo Contrast Result Date: 02/14/2024 CLINICAL DATA:  Known abdominal aortic aneurysm with a type 2 endoleak. Patient now having lower abdominal pain with nausea/vomiting/diarrhea. EXAM: CT ANGIOGRAPHY CHEST, ABDOMEN AND PELVIS TECHNIQUE: Non-contrast CT of the chest was initially obtained. Multidetector CT  imaging through the chest, abdomen and pelvis was performed using the standard protocol during bolus administration of intravenous contrast. Multiplanar reconstructed images and MIPs were obtained and reviewed to evaluate the vascular anatomy. RADIATION DOSE REDUCTION: This exam was performed according to the departmental dose-optimization program which includes automated exposure control, adjustment of the mA and/or kV according to patient size and/or use of iterative reconstruction technique. CONTRAST:  75mL OMNIPAQUE  IOHEXOL  350 MG/ML SOLN COMPARISON:  None Available. FINDINGS: CTA CHEST FINDINGS Cardiovascular: Redemonstration of mild dilatation of the ascending thoracic aorta measuring 4.2 cm and without change when compared with previous study from 03/17/2020. Moderate calcified atherosclerosis involving the aortic arch. No aortic dissection. Cardiac size unremarkable. No pericardial effusion. Coronary artery calcifications not fully evaluated this non gated study. Mediastinum/Nodes: No mediastinal or hilar lymphadenopathy. Lungs/Pleura: Lungs are clear. No pneumothorax or pleural effusion. Trachea and visualized bronchi clear and grossly unremarkable. Musculoskeletal: Age related changes in the cervical and thoracic spine. Review of the MIP images confirms the above findings. CTA  ABDOMEN AND PELVIS FINDINGS VASCULAR Aorta: Redemonstration of an abdominal aortic infrarenal aneurysm with a type 2 endoleak involving the IMA and paired L4 lumbar arteries. Overall the aneurysm sac has increased in size when compared with previous study and now measures 5.7 cm x 5.5 cm when measured at a similar location as measured on the previous study from 01/05/2024. Celiac: Patent common but 60-70% stenosis at the origin secondary to calcified plaque. SMA: Proximally 40% stenotic, but patent and unchanged. Renals: Redemonstration of calcified plaque at the origins of the right and left renal arteries with the left renal artery demonstrating proximally 75% stenosis. Kidneys enhance in a symmetric fashion. IMA: Patent and contributes to the type 2 endoleak Inflow: Patent bilaterally without significant stenosis through the stented portion. Markedly calcified external iliac arteries and common femoral arteries bilaterally. Veins: No evidence of DVT or significant abnormal varices. Review of the MIP images confirms the above findings. NON-VASCULAR Hepatobiliary: The liver and gallbladder are grossly unremarkable. No abnormal enhancing mass. Pancreas: Pancreas is atrophied and otherwise unremarkable. Spleen: Unremarkable Adrenals/Urinary Tract: Unremarkable and without abnormal enhancing mass. Numerous cysts identified bilaterally and without change when compared with the previous studies. Stomach/Bowel: The stomach, small bowel, and colon are unremarkable. Appendix unremarkable for size and without acute inflammation. Lymphatic: No intraabdominal or pelvic adenopathy. A few mildly enlarged lymph nodes are identified at the bilateral common femoral artery region. Reproductive: Prostate gland mildly enlarged and demonstrates calcifications. Other: No ascites or evidence of acute hemorrhage. Musculoskeletal: Degenerative changes identified throughout the axial skeleton without evidence of aggressive osseous  destruction. Muscular structures age-appropriate. Review of the MIP images confirms the above findings. IMPRESSION: No acute intra-abdominal finding that would suggest etiology to the patient's symptoms of nausea, vomiting, diarrhea. No abnormal abdominal or pelvic fluid collection. No abdominal or pelvic adenopathy. Redemonstration of an abdominal aortic aneurysm which has increased in size when compared with the previous study from 01/05/2024 and continues to show a type 2 endoleak from the IMA and paired L4 lumbar arteries. Redemonstration dilated ascending thoracic aorta measuring 4.2 cm and without change when compared previous study from 03/17/2020 Electronically Signed   By: Cordella Banner   On: 02/14/2024 16:40

## 2024-03-04 NOTE — Telephone Encounter (Signed)
 Spoke with patient, scheduled EGD for 03/13/2024 at 1pm. Instructions sent in the mail.

## 2024-03-06 ENCOUNTER — Other Ambulatory Visit: Payer: Self-pay | Admitting: Family Medicine

## 2024-03-06 DIAGNOSIS — I4819 Other persistent atrial fibrillation: Secondary | ICD-10-CM

## 2024-03-11 ENCOUNTER — Encounter (HOSPITAL_COMMUNITY): Payer: Self-pay

## 2024-03-11 ENCOUNTER — Inpatient Hospital Stay (HOSPITAL_COMMUNITY): Admission: RE | Admit: 2024-03-11 | Discharge: 2024-03-11 | Attending: Gastroenterology

## 2024-03-11 NOTE — Progress Notes (Signed)
 Spoke with Dorothe (patient's daughter)  patient to be NPO after midnight night prior to procedure.  Instructed on stopping eliquis  last dose should have been 03/10/2024.  Patient to arrive at 11:00 a.m.

## 2024-03-12 ENCOUNTER — Ambulatory Visit: Payer: Self-pay | Admitting: Family Medicine

## 2024-03-12 ENCOUNTER — Encounter: Payer: Self-pay | Admitting: Family Medicine

## 2024-03-12 DIAGNOSIS — F332 Major depressive disorder, recurrent severe without psychotic features: Secondary | ICD-10-CM

## 2024-03-12 DIAGNOSIS — I4819 Other persistent atrial fibrillation: Secondary | ICD-10-CM

## 2024-03-12 DIAGNOSIS — Z8673 Personal history of transient ischemic attack (TIA), and cerebral infarction without residual deficits: Secondary | ICD-10-CM

## 2024-03-12 DIAGNOSIS — G609 Hereditary and idiopathic neuropathy, unspecified: Secondary | ICD-10-CM

## 2024-03-12 DIAGNOSIS — G610 Guillain-Barre syndrome: Secondary | ICD-10-CM

## 2024-03-12 DIAGNOSIS — I10 Essential (primary) hypertension: Secondary | ICD-10-CM | POA: Diagnosis not present

## 2024-03-12 DIAGNOSIS — K219 Gastro-esophageal reflux disease without esophagitis: Secondary | ICD-10-CM

## 2024-03-12 DIAGNOSIS — I714 Abdominal aortic aneurysm, without rupture, unspecified: Secondary | ICD-10-CM

## 2024-03-12 DIAGNOSIS — E782 Mixed hyperlipidemia: Secondary | ICD-10-CM | POA: Diagnosis not present

## 2024-03-12 DIAGNOSIS — F411 Generalized anxiety disorder: Secondary | ICD-10-CM | POA: Diagnosis not present

## 2024-03-12 MED ORDER — DOFETILIDE 500 MCG PO CAPS: 500.0000 ug | ORAL_CAPSULE | Freq: Two times a day (BID) | ORAL | 0 refills | Status: AC

## 2024-03-12 MED ORDER — AMLODIPINE BESYLATE 5 MG PO TABS: 5.0000 mg | ORAL_TABLET | Freq: Every day | ORAL | 0 refills | Status: AC

## 2024-03-12 MED ORDER — MIRTAZAPINE 7.5 MG PO TABS: 7.5000 mg | ORAL_TABLET | Freq: Every day | ORAL | 3 refills | Status: AC

## 2024-03-12 MED ORDER — APIXABAN 5 MG PO TABS: 5.0000 mg | ORAL_TABLET | Freq: Two times a day (BID) | ORAL | 0 refills | Status: AC

## 2024-03-12 NOTE — Progress Notes (Signed)
 Subjective:  Patient ID: Theodore Mendoza, male    DOB: 06/28/1940  Age: 83 y.o. MRN: [id number]  CC: Medical Management of Chronic Issues [facility] tomorrow for EGD for nausea that has been going on for a long time. /Blood transfusion on 12/16 letter with him )   HPI  Discussed the use of AI scribe software for clinical note transcription with the patient, who gave verbal consent to proceed.  History of Present Illness Theodore Mendoza is an 83 year old male who presents with gastrointestinal bleeding.  He has stopped taking iron  and Eliquis , and is avoiding alcohol, cranberry juice, and any red-colored foods or drinks as instructed.  In early October, he experienced nausea, vomiting, and diarrhea, which led to an emergency room visit. These symptoms were resolved with anti-nausea medication.  He is currently taking mirtazapine , two tablets at night, to aid with sleep. He also takes pantoprazole  twice a day for stomach issues, but continues to experience bleeding despite this treatment. Additionally, he takes sucralfate  twice a day for his stomach, but has paused this medication before the procedure.  He experiences frequent nighttime urination and takes tamsulosin , two tablets at bedtime, to manage this symptom, though it has not been effective in reducing his nighttime bathroom visits.          03/04/2024    1:07 PM 12/11/2023   11:02 AM 09/24/2023   11:04 AM  Depression screen PHQ 2/9  Decreased Interest 1 1 1   Down, Depressed, Hopeless 1 2 1   PHQ - 2 Score 2 3 2   Altered sleeping  3 0  Tired, decreased energy  3 3  Change in appetite  0 0  Feeling bad or failure about yourself   3 2  Trouble concentrating  1 0  Moving slowly or fidgety/restless  3 0  Suicidal thoughts  1 0  PHQ-9 Score  17  7   Difficult doing work/chores   Very difficult     Data saved with a previous flowsheet row definition    History Theodore Mendoza has a past medical history of AAA (abdominal aortic aneurysm),  Atrial fibrillation (HCC), Brain tumor (HCC), Cancer (HCC), Cardiac arrhythmia, CHF (congestive heart failure) (HCC), GERD (gastroesophageal reflux disease), Guillain-Barre syndrome (02/13/1986), Headache, Hypertension, Kidney stones, and Myocardial infarction (HCC) (2007).   He has a past surgical history that includes Cystoscopy with stent placement (Left, 05/08/2014); Coronary angioplasty (1997); Eye surgery (Right, may 2015); gamma kniferadiation treatment (Feb 09 2014); Lithotripsy (years ago); Cystoscopy with retrograde pyelogram, ureteroscopy and stent placement (Left, 06/28/2014); Holmium laser application (Left, 06/28/2014); Stone extraction with basket (Left, 06/28/2014); Esophagogastroduodenoscopy (egd) with propofol  (N/A, 12/16/2019); Hemostasis clip placement (12/16/2019); Hemostasis control (12/16/2019); polypectomy (12/16/2019); Esophagogastroduodenoscopy (egd) with propofol  (N/A, 08/13/2020); Colonoscopy with propofol  (N/A, 08/14/2020); Esophagogastroduodenoscopy (egd) with propofol  (N/A, 09/16/2020); enteroscopy (N/A, 09/16/2020); Givens capsule study (N/A, 10/11/2020); Esophagogastroduodenoscopy (egd) with propofol  (N/A, 10/25/2020); enteroscopy (N/A, 10/25/2020); and Abdominal aortic endovascular stent graft (Bilateral, 04/04/2022).   His family history includes CAD in his father; Diabetes in his brother, mother, and sister; Lung cancer in his mother; Other in his maternal grandfather; Stroke in his sister.He reports that he has never smoked. He has never been exposed to tobacco smoke. He has never used smokeless tobacco. He reports that he does not drink alcohol and does not use drugs.    ROS Review of Systems  Constitutional: Negative.   HENT: Negative.    Eyes:  Negative for visual disturbance.  Respiratory:  Negative for cough and  shortness of breath.   Cardiovascular:  Negative for chest pain and leg swelling.  Gastrointestinal:  Negative for abdominal pain, diarrhea, nausea and vomiting.   Genitourinary:  Negative for difficulty urinating.  Musculoskeletal:  Negative for arthralgias and myalgias.  Skin:  Negative for rash.  Neurological:  Negative for headaches.  Psychiatric/Behavioral:  Negative for sleep disturbance.     Objective:  BP 125/60   Pulse (!) 54   Temp 97.9 F (36.6 C)   Ht 5' 8 (1.727 m)   SpO2 99%   BMI 22.81 kg/m   BP Readings from Last 3 Encounters:  03/12/24 125/60  03/04/24 98/60  03/03/24 135/69    Wt Readings from Last 3 Encounters:  03/03/24 150 lb (68 kg)  12/11/23 149 lb (67.6 kg)  11/25/23 149 lb (67.6 kg)     Physical Exam Vitals reviewed.  Constitutional:      Appearance: He is well-developed.  HENT:     Head: Normocephalic and atraumatic.     Right Ear: External ear normal.     Left Ear: External ear normal.     Mouth/Throat:     Pharynx: No oropharyngeal exudate or posterior oropharyngeal erythema.  Eyes:     Pupils: Pupils are equal, round, and reactive to light.  Cardiovascular:     Rate and Rhythm: Normal rate and regular rhythm.     Heart sounds: No murmur heard. Pulmonary:     Effort: No respiratory distress.     Breath sounds: Normal breath sounds.  Musculoskeletal:        General: Deformity (paraglegia due to Illene Birch) present.     Cervical back: Normal range of motion and neck supple.  Neurological:     Mental Status: He is alert and oriented to person, place, and time.    Physical Exam VITALS: BP- 125/60 GENERAL: Alert, cooperative, well developed, no acute distress HEENT: Normocephalic, normal oropharynx, moist mucous membranes CHEST: Clear to auscultation bilaterally, No wheezes, rhonchi, or crackles CARDIOVASCULAR: Normal heart rate and rhythm, S1 and S2 normal without murmurs ABDOMEN: Soft, non-tender, non-distended, without organomegaly, Normal bowel sounds EXTREMITIES: No cyanosis or edema NEUROLOGICAL: Cranial nerves grossly intact, Moves all extremities without gross motor or sensory  deficit   Assessment & Plan:  Guillain Barr syndrome -     Gabapentin ; TAKE 1 CAPSULE BY MOUTH 3 TIMES A DAY  Dispense: 360 capsule; Refill: 3  Idiopathic peripheral neuropathy -     Gabapentin ; TAKE 1 CAPSULE BY MOUTH 3 TIMES A DAY  Dispense: 360 capsule; Refill: 3  Gastroesophageal reflux disease, unspecified whether esophagitis present -     Pantoprazole  Sodium; Take 1 tablet (40 mg total) by mouth 2 (two) times daily.  Dispense: 180 tablet; Refill: 3  Mixed hyperlipidemia -     Pravastatin  Sodium; Take 1 tablet (40 mg total) by mouth daily.  Dispense: 90 tablet; Refill: 3  Abdominal aortic aneurysm (AAA) without rupture, unspecified part -     Pravastatin  Sodium; Take 1 tablet (40 mg total) by mouth daily.  Dispense: 90 tablet; Refill: 3  History of TIA (transient ischemic attack) -     Pravastatin  Sodium; Take 1 tablet (40 mg total) by mouth daily.  Dispense: 90 tablet; Refill: 3  Essential (primary) hypertension -     amLODIPine  Besylate; Take 1 tablet (5 mg total) by mouth daily.  Dispense: 90 tablet; Refill: 0  Severe episode of recurrent major depressive disorder, without psychotic features (HCC) -     Mirtazapine ; Take  1 tablet (7.5 mg total) by mouth at bedtime.  Dispense: 90 tablet; Refill: 3  Generalized anxiety disorder -     Mirtazapine ; Take 1 tablet (7.5 mg total) by mouth at bedtime.  Dispense: 90 tablet; Refill: 3  Persistent atrial fibrillation (HCC) -     Apixaban ; Take 1 tablet (5 mg total) by mouth 2 (two) times daily.  Dispense: 60 tablet; Refill: 0  Other orders -     Dofetilide ; Take 1 capsule (500 mcg total) by mouth 2 (two) times daily.  Dispense: 60 capsule; Refill: 0 -     Alfuzosin  HCl ER; Take 1 tablet (10 mg total) by mouth daily with breakfast.  Dispense: 90 tablet; Refill: 1    Assessment and Plan Assessment & Plan Gastrointestinal bleeding   He has ongoing gastrointestinal bleeding despite pantoprazole  and sucralfate . An endoscopy is  scheduled for December 16th, 2025, to investigate the cause. Iron  and Eliquis  have been stopped to reduce bleeding risk. Sucralfate  is paused to avoid interference with endoscopy findings and will be resumed after the procedure unless advised otherwise.  Gastroesophageal reflux disease   Symptoms persist despite management with pantoprazole  twice daily. Continue pantoprazole  twice daily.  Benign prostatic hyperplasia with lower urinary tract symptoms   He experiences frequent nocturia. Symptoms persist with tamsulosin , so he has switched to a stronger medication, one tablet at bedtime. Advised to take the new medication in the morning with breakfast for better symptom control.  Essential hypertension   Blood pressure is well-controlled at 125/60 mmHg.  Major depressive disorder, recurrent, severe   Managed with mirtazapine  for sleep, taken at night. Continue mirtazapine  at night for sleep.       Follow-up: No follow-ups on file.  Butler Der, M.D.

## 2024-03-13 ENCOUNTER — Encounter (HOSPITAL_COMMUNITY): Payer: Self-pay | Admitting: Gastroenterology

## 2024-03-13 ENCOUNTER — Ambulatory Visit (HOSPITAL_COMMUNITY)
Admission: RE | Admit: 2024-03-13 | Discharge: 2024-03-13 | Disposition: A | Attending: Gastroenterology | Admitting: Gastroenterology

## 2024-03-13 ENCOUNTER — Ambulatory Visit (HOSPITAL_COMMUNITY): Admitting: Anesthesiology

## 2024-03-13 ENCOUNTER — Encounter (HOSPITAL_COMMUNITY): Admission: RE | Disposition: A | Payer: Self-pay | Attending: Gastroenterology

## 2024-03-13 DIAGNOSIS — K317 Polyp of stomach and duodenum: Secondary | ICD-10-CM | POA: Diagnosis not present

## 2024-03-13 DIAGNOSIS — R1030 Lower abdominal pain, unspecified: Secondary | ICD-10-CM | POA: Diagnosis present

## 2024-03-13 DIAGNOSIS — K3189 Other diseases of stomach and duodenum: Secondary | ICD-10-CM | POA: Diagnosis not present

## 2024-03-13 DIAGNOSIS — Z79899 Other long term (current) drug therapy: Secondary | ICD-10-CM | POA: Diagnosis not present

## 2024-03-13 DIAGNOSIS — I110 Hypertensive heart disease with heart failure: Secondary | ICD-10-CM | POA: Diagnosis not present

## 2024-03-13 DIAGNOSIS — K2950 Unspecified chronic gastritis without bleeding: Secondary | ICD-10-CM | POA: Diagnosis not present

## 2024-03-13 DIAGNOSIS — F32A Depression, unspecified: Secondary | ICD-10-CM | POA: Diagnosis not present

## 2024-03-13 DIAGNOSIS — R112 Nausea with vomiting, unspecified: Secondary | ICD-10-CM | POA: Diagnosis present

## 2024-03-13 DIAGNOSIS — I509 Heart failure, unspecified: Secondary | ICD-10-CM | POA: Diagnosis not present

## 2024-03-13 DIAGNOSIS — I2510 Atherosclerotic heart disease of native coronary artery without angina pectoris: Secondary | ICD-10-CM | POA: Diagnosis not present

## 2024-03-13 DIAGNOSIS — K219 Gastro-esophageal reflux disease without esophagitis: Secondary | ICD-10-CM | POA: Diagnosis not present

## 2024-03-13 DIAGNOSIS — D509 Iron deficiency anemia, unspecified: Secondary | ICD-10-CM | POA: Diagnosis present

## 2024-03-13 DIAGNOSIS — I4891 Unspecified atrial fibrillation: Secondary | ICD-10-CM | POA: Diagnosis not present

## 2024-03-13 DIAGNOSIS — I252 Old myocardial infarction: Secondary | ICD-10-CM | POA: Diagnosis not present

## 2024-03-13 DIAGNOSIS — N289 Disorder of kidney and ureter, unspecified: Secondary | ICD-10-CM | POA: Diagnosis not present

## 2024-03-13 DIAGNOSIS — G709 Myoneural disorder, unspecified: Secondary | ICD-10-CM | POA: Diagnosis not present

## 2024-03-13 SURGERY — EGD (ESOPHAGOGASTRODUODENOSCOPY)
Anesthesia: Monitor Anesthesia Care

## 2024-03-13 MED ORDER — DICYCLOMINE HCL 10 MG PO CAPS: 10.0000 mg | ORAL_CAPSULE | Freq: Two times a day (BID) | ORAL | 1 refills | Status: AC | PRN

## 2024-03-13 MED ORDER — PROPOFOL 500 MG/50ML IV EMUL
INTRAVENOUS | Status: DC | PRN
  Administered 2024-03-13: 60 mg via INTRAVENOUS
  Administered 2024-03-13: 100 ug/kg/min via INTRAVENOUS

## 2024-03-13 MED ORDER — LIDOCAINE 2% (20 MG/ML) 5 ML SYRINGE
INTRAMUSCULAR | Status: DC | PRN
  Administered 2024-03-13: 60 mg via INTRAVENOUS
  Administered 2024-03-13: 20 mg via INTRAVENOUS

## 2024-03-13 MED ORDER — LACTATED RINGERS IV SOLN: INTRAVENOUS | Status: DC

## 2024-03-13 NOTE — Discharge Instructions (Signed)
 You are being discharged to home.  Resume your previous diet.  We are waiting for your pathology results.  Restart Eliquis  on AM of 03/16/2024. Take dicyclomine 10 mg every 12 hours as needed for abdominal pain Continue your present medications.

## 2024-03-13 NOTE — Anesthesia Preprocedure Evaluation (Signed)
 Anesthesia Evaluation  Patient identified by MRN, date of birth, ID band Patient awake    Reviewed: Allergy & Precautions, H&P , NPO status , Patient's Chart, lab work & pertinent test results, reviewed documented beta blocker date and time   Airway Mallampati: II  TM Distance: >3 FB Neck ROM: full    Dental no notable dental hx.    Pulmonary neg pulmonary ROS   Pulmonary exam normal breath sounds clear to auscultation       Cardiovascular Exercise Tolerance: Good hypertension, + CAD, + Past MI and +CHF  negative cardio ROS Atrial Fibrillation  Rhythm:regular Rate:Normal     Neuro/Psych  Headaches PSYCHIATRIC DISORDERS  Depression     Neuromuscular disease negative neurological ROS  negative psych ROS   GI/Hepatic negative GI ROS, Neg liver ROS,GERD  ,,  Endo/Other  negative endocrine ROS    Renal/GU Renal diseasenegative Renal ROS  negative genitourinary   Musculoskeletal   Abdominal   Peds  Hematology negative hematology ROS (+) Blood dyscrasia, anemia   Anesthesia Other Findings   Reproductive/Obstetrics negative OB ROS                              Anesthesia Physical Anesthesia Plan  ASA: 3  Anesthesia Plan: MAC   Post-op Pain Management:    Induction:   PONV Risk Score and Plan: Propofol  infusion  Airway Management Planned:   Additional Equipment:   Intra-op Plan:   Post-operative Plan:   Informed Consent: I have reviewed the patients History and Physical, chart, labs and discussed the procedure including the risks, benefits and alternatives for the proposed anesthesia with the patient or authorized representative who has indicated his/her understanding and acceptance.     Dental Advisory Given  Plan Discussed with: CRNA  Anesthesia Plan Comments:         Anesthesia Quick Evaluation

## 2024-03-13 NOTE — Interval H&P Note (Signed)
 History and Physical Interval Note:  03/13/2024 11:37 AM  Theodore Mendoza  has presented today for surgery, with the diagnosis of nausea, iron  deficiency anemia.  The various methods of treatment have been discussed with the patient and family. After consideration of risks, benefits and other options for treatment, the patient has consented to  Procedures with comments: EGD (ESOPHAGOGASTRODUODENOSCOPY) (N/A) - 1:00pm, ASA 3 as a surgical intervention.  The patient's history has been reviewed, patient examined, no change in status, stable for surgery.  I have reviewed the patient's chart and labs.  Questions were answered to the patient's satisfaction.     Mckensi Redinger Castaneda Mayorga

## 2024-03-13 NOTE — Transfer of Care (Addendum)
 Immediate Anesthesia Transfer of Care Note  Patient: Theodore Mendoza  Procedure(s) Performed: EGD (ESOPHAGOGASTRODUODENOSCOPY) POLYPECTOMY, INTESTINE CONTROL OF HEMORRHAGE, GI TRACT, ENDOSCOPIC, BY CLIPPING OR OVERSEWING  Patient Location: Short Stay  Anesthesia Type:MAC  Level of Consciousness: drowsy and patient cooperative  Airway & Oxygen Therapy: Patient Spontanous Breathing and Patient connected to face mask oxygen  Post-op Assessment: Report given to RN and Post -op Vital signs reviewed and stable  Post vital signs: Reviewed and stable  Last Vitals:  Vitals Value Taken Time  BP 136/60 03/13/24   1447  Temp 36.8 03/13/24   1447  Pulse 80 03/13/24   1447  Resp 17 03/13/24   1447  SpO2 100% 03/13/24   1447    Last Pain:  Vitals:   03/13/24 1415  TempSrc:   PainSc: 0-No pain      Patients Stated Pain Goal: 5 (03/13/24 1120)  Complications: No notable events documented.

## 2024-03-13 NOTE — Op Note (Signed)
 [facility] Patient Name: Theodore Mendoza Procedure Date: 03/13/2024 1:33 PM MRN: [id number] Date of Birth: 03-18-1941 Attending MD: Toribio Fortune , , [id number] CSN: [id number] Age: 83 Admit Type: Outpatient Procedure:                Upper GI endoscopy Indications:              Lower abdominal pain, Iron  deficiency anemia,                            Nausea with vomiting Providers:                Toribio Fortune, Rosina Sprague, Bascom Blush Referring MD:              Medicines:                Monitored Anesthesia Care Complications:            No immediate complications. Estimated Blood Loss:     Estimated blood loss: none. Procedure:                Pre-Anesthesia Assessment:                           - Prior to the procedure, a History and Physical                            was performed, and patient medications, allergies                            and sensitivities were reviewed. The patient's                            tolerance of previous anesthesia was reviewed.                           - The risks and benefits of the procedure and the                            sedation options and risks were discussed with the                            patient. All questions were answered and informed                            consent was obtained.                           - ASA Grade Assessment: III - A patient with severe                            systemic disease.                           After obtaining informed consent, the endoscope was                            passed under direct vision. Throughout  the                            procedure, the patient's blood pressure, pulse, and                            oxygen saturations were monitored continuously. The                            Endoscope was introduced through the mouth, and                            advanced to the second part of duodenum. The upper                            GI endoscopy was accomplished without  difficulty.                            The patient tolerated the procedure well. Scope In: 2:19:48 PM Scope Out: 2:39:47 PM Total Procedure Duration: 0 hours 19 minutes 59 seconds  Findings:      The examined esophagus was normal.      A deformity was found in the gastric body.      Six 3 to 10 mm sessile polyps with no bleeding and stigmata of recent       bleeding were found in the gastric body and antrum. Three of the polyps       (largest) were removed with a hot snare. Resection and retrieval were       complete. To prevent bleeding after the polypectomy, two hemostatic       clips were successfully placed (MR safe) in the antral polypectomy site.       Clip manufacturer: [employer]. There was no bleeding at the end       of the procedure.      Diffuse atrophic mucosa was found in the entire examined stomach.       Biopsies were taken with a cold forceps for Helicobacter pylori testing.      The examined duodenum was normal. Impression:               - Normal esophagus.                           - Acquired deformity in the gastric body.                           - Six gastric polyps. Three resected and retrieved.                            Clip manufacturer: [employer]. Clips (MR                            safe) were placed.                           - Gastric mucosal atrophy. Biopsied.                           -  Normal examined duodenum. Moderate Sedation:      Per Anesthesia Care Recommendation:           - Discharge patient to home (ambulatory).                           - Resume previous diet.                           - Await pathology results.                           - Take dicyclomine 10 mg every 12 hours as needed                            for abdominal pain                           - Restart Eliquis  on AM of 03/16/2024.                           - Continue present medications. Procedure Code(s):        --- Professional ---                            [phone], Esophagogastroduodenoscopy, flexible,                            transoral; with removal of tumor(s), polyp(s), or                            other lesion(s) by snare technique                           43239, 59, Esophagogastroduodenoscopy, flexible,                            transoral; with biopsy, single or multiple Diagnosis Code(s):        --- Professional ---                           K31.89, Other diseases of stomach and duodenum                           K31.7, Polyp of stomach and duodenum                           R10.30, Lower abdominal pain, unspecified                           D50.9, Iron  deficiency anemia, unspecified                           R11.2, Nausea with vomiting, unspecified CPT copyright 2022 American Medical Association. All rights reserved. The codes documented in this report are preliminary and upon coder review may  be revised to meet current compliance requirements. Toribio Fortune, MD Toribio Fortune,  03/13/2024 2:48:53 PM This  report has been signed electronically. Number of Addenda: 0

## 2024-03-13 NOTE — Anesthesia Procedure Notes (Signed)
 Date/Time: 03/13/2024 2:18 PM  Performed by: Para Jerelene CROME, CRNAOxygen Delivery Method: Simple face mask Comments: POM Face Mask.

## 2024-03-14 ENCOUNTER — Encounter: Payer: Self-pay | Admitting: Family Medicine

## 2024-03-16 ENCOUNTER — Encounter (HOSPITAL_COMMUNITY): Payer: Self-pay | Admitting: Gastroenterology

## 2024-03-16 ENCOUNTER — Encounter: Payer: Self-pay | Admitting: Oncology

## 2024-03-17 ENCOUNTER — Encounter (INDEPENDENT_AMBULATORY_CARE_PROVIDER_SITE_OTHER): Payer: Self-pay

## 2024-03-17 ENCOUNTER — Telehealth (INDEPENDENT_AMBULATORY_CARE_PROVIDER_SITE_OTHER): Payer: Self-pay

## 2024-03-17 ENCOUNTER — Inpatient Hospital Stay

## 2024-03-17 VITALS — BP 147/68 | HR 50 | Temp 97.4°F | Resp 16

## 2024-03-17 DIAGNOSIS — R109 Unspecified abdominal pain: Secondary | ICD-10-CM | POA: Insufficient documentation

## 2024-03-17 DIAGNOSIS — D509 Iron deficiency anemia, unspecified: Secondary | ICD-10-CM | POA: Diagnosis not present

## 2024-03-17 DIAGNOSIS — D508 Other iron deficiency anemias: Secondary | ICD-10-CM

## 2024-03-17 MED ORDER — METHYLPREDNISOLONE SODIUM SUCC 125 MG IJ SOLR
125.0000 mg | Freq: Once | INTRAMUSCULAR | Status: AC
  Administered 2024-03-17: 10:00:00 125 mg via INTRAVENOUS
  Filled 2024-03-17: qty 2

## 2024-03-17 MED ORDER — FAMOTIDINE IN NACL 20-0.9 MG/50ML-% IV SOLN
20.0000 mg | Freq: Once | INTRAVENOUS | Status: AC
  Administered 2024-03-17: 10:00:00 20 mg via INTRAVENOUS
  Filled 2024-03-17: qty 50

## 2024-03-17 MED ORDER — SODIUM CHLORIDE 0.9 % IV SOLN
950.0000 mg | Freq: Once | INTRAVENOUS | Status: AC
  Administered 2024-03-17: 11:00:00 950 mg via INTRAVENOUS
  Filled 2024-03-17: qty 19

## 2024-03-17 MED ORDER — CETIRIZINE HCL 10 MG/ML IV SOLN
5.0000 mg | Freq: Once | INTRAVENOUS | Status: AC
  Administered 2024-03-17: 10:00:00 5 mg via INTRAVENOUS
  Filled 2024-03-17: qty 1

## 2024-03-17 MED ORDER — SODIUM CHLORIDE 0.9 % IV SOLN
50.0000 mg | Freq: Once | INTRAVENOUS | Status: AC
  Administered 2024-03-17: 11:00:00 50 mg via INTRAVENOUS
  Filled 2024-03-17: qty 1

## 2024-03-17 MED ORDER — SODIUM CHLORIDE 0.9 % IV SOLN: INTRAVENOUS | Status: AC

## 2024-03-17 MED ORDER — ACETAMINOPHEN 325 MG PO TABS
650.0000 mg | ORAL_TABLET | Freq: Once | ORAL | Status: AC
  Administered 2024-03-17: 10:00:00 650 mg via ORAL
  Filled 2024-03-17: qty 2

## 2024-03-17 NOTE — Telephone Encounter (Signed)
 I spoke with the patient and made him aware his insurance would not pay for the Dicyclomine , and out of pocket it would cost him $25.00. I asked if he could afford this and he says he could.

## 2024-03-17 NOTE — Patient Instructions (Signed)
 [facility] [place] - A DEPT OF [place]  Discharge Instructions: Thank you for choosing [facility] to provide your oncology and hematology care.  If you have a lab appointment with the Cancer Center - please note that after April 8th, 2024, all labs will be drawn in the cancer center.  You do not have to check in or register with the main entrance as you have in the past but will complete your check-in in the cancer center.  Wear comfortable clothing and clothing appropriate for easy access to any Portacath or PICC line.   We strive to give you quality time with your provider. You may need to reschedule your appointment if you arrive late (15 or more minutes).  Arriving late affects you and other patients whose appointments are after yours.  Also, if you miss three or more appointments without notifying the office, you may be dismissed from the clinic at the providers discretion.      For prescription refill requests, have your pharmacy contact our office and allow 72 hours for refills to be completed.    Today you received Infed        To help prevent nausea and vomiting after your treatment, we encourage you to take your nausea medication as directed.  BELOW ARE SYMPTOMS THAT SHOULD BE REPORTED IMMEDIATELY: *FEVER GREATER THAN 100.4 F (38 C) OR HIGHER *CHILLS OR SWEATING *NAUSEA AND VOMITING THAT IS NOT CONTROLLED WITH YOUR NAUSEA MEDICATION *UNUSUAL SHORTNESS OF BREATH *UNUSUAL BRUISING OR BLEEDING *URINARY PROBLEMS (pain or burning when urinating, or frequent urination) *BOWEL PROBLEMS (unusual diarrhea, constipation, pain near the anus) TENDERNESS IN MOUTH AND THROAT WITH OR WITHOUT PRESENCE OF ULCERS (sore throat, sores in mouth, or a toothache) UNUSUAL RASH, SWELLING OR PAIN  UNUSUAL VAGINAL DISCHARGE OR ITCHING   Items with * indicate a potential emergency and should be followed up as soon as possible or go to the Emergency Department if  any problems should occur.  Please show the CHEMOTHERAPY ALERT CARD or IMMUNOTHERAPY ALERT CARD at check-in to the Emergency Department and triage nurse.  Should you have questions after your visit or need to cancel or reschedule your appointment, please contact [facility] [place] - A DEPT OF [facility] [phone]  and follow the prompts.  Office hours are 8:00 a.m. to 4:30 p.m. Monday - Friday. Please note that voicemails left after 4:00 p.m. may not be returned until the following business day.  We are closed weekends and major holidays. You have access to a nurse at all times for urgent questions. Please call the main number to the clinic [phone] and follow the prompts.  For any non-urgent questions, you may also contact your provider using MyChart. We now offer e-Visits for anyone 42 and older to request care online for non-urgent symptoms. For details visit [email or web address].   Also download the MyChart app! Go to the app store, search MyChart, open the app, select , and log in with your MyChart username and password.

## 2024-03-17 NOTE — Telephone Encounter (Signed)
 I spoke with the pharmacist Stew at [facility] and he says the cost without insurance is $25.00.   PA Case ID #: [id number] Rx #: [id number] Need Help? Call us  at [phone] Outcome Denied today by [employer] [facility] 2017 Denied. We denied coverage for this drug because Dicyclomine  is not being prescribed for an FDA labeled or medically accepted use. A medically accepted use is approved by the FDA or supported by the [facility] Formulary Service Drug Information and the DRUGDEX Information System. In this case, Dicyclomine  is not being prescribed in accordance with an FDA labeled use or use accepted by the Medicare approved drug compendia. Drug Dicyclomine  HCl 10MG  capsules ePA cloud logo Form [facility] Electronic Request Form Original Claim Info [phone] MD CALL [phone] RISK MEDICATION IN THE ELDERLY;CONSIDER NON-HRM ALTERNATIVE

## 2024-03-18 ENCOUNTER — Ambulatory Visit (INDEPENDENT_AMBULATORY_CARE_PROVIDER_SITE_OTHER): Payer: Self-pay | Admitting: Gastroenterology

## 2024-03-18 LAB — SURGICAL PATHOLOGY

## 2024-03-19 NOTE — Anesthesia Postprocedure Evaluation (Signed)
 Anesthesia Post Note  Patient: Theodore Mendoza  Procedure(s) Performed: EGD (ESOPHAGOGASTRODUODENOSCOPY) POLYPECTOMY, INTESTINE CONTROL OF HEMORRHAGE, GI TRACT, ENDOSCOPIC, BY CLIPPING OR OVERSEWING  Patient location during evaluation: Phase II Anesthesia Type: MAC Level of consciousness: awake Pain management: pain level controlled Vital Signs Assessment: post-procedure vital signs reviewed and stable Respiratory status: spontaneous breathing and respiratory function stable Cardiovascular status: blood pressure returned to baseline and stable Postop Assessment: no headache and no apparent nausea or vomiting Anesthetic complications: no Comments: Late entry   No notable events documented.   Last Vitals:  Vitals:   03/13/24 1120 03/13/24 1447  BP: (!) 145/55 136/60  Pulse: (!) 58 79  Resp:  17  Temp: (!) 36.4 C 36.8 C  SpO2: 99% 100%    Last Pain:  Vitals:   03/13/24 1447  TempSrc: Oral  PainSc: 0-No pain                 Yvonna JINNY Bosworth

## 2024-03-20 NOTE — Progress Notes (Signed)
 Patient result letter mailed Patient's PCP is on EPIC

## 2024-04-21 ENCOUNTER — Other Ambulatory Visit: Payer: Self-pay | Admitting: Family Medicine

## 2024-04-21 DIAGNOSIS — R601 Generalized edema: Secondary | ICD-10-CM

## 2024-05-26 ENCOUNTER — Inpatient Hospital Stay: Attending: Oncology

## 2024-06-02 ENCOUNTER — Inpatient Hospital Stay: Attending: Oncology | Admitting: Oncology

## 2024-06-15 ENCOUNTER — Ambulatory Visit: Admitting: Family Medicine

## 2024-08-03 ENCOUNTER — Ambulatory Visit

## 2024-08-03 ENCOUNTER — Ambulatory Visit (HOSPITAL_COMMUNITY)

## 2024-09-10 ENCOUNTER — Ambulatory Visit: Payer: Self-pay

## 2024-09-22 ENCOUNTER — Ambulatory Visit
